# Patient Record
Sex: Male | Born: 1950 | Race: White | Hispanic: No | Marital: Single | Smoking: Never smoker
Health system: Southern US, Community
[De-identification: ages and names within clinical notes are randomized; demographics above are authoritative.]

## PROBLEM LIST (undated history)

## (undated) DIAGNOSIS — I482 Chronic atrial fibrillation, unspecified: Secondary | ICD-10-CM

## (undated) DIAGNOSIS — I639 Cerebral infarction, unspecified: Secondary | ICD-10-CM

## (undated) DIAGNOSIS — I10 Essential (primary) hypertension: Secondary | ICD-10-CM

## (undated) DIAGNOSIS — I5022 Chronic systolic (congestive) heart failure: Secondary | ICD-10-CM

## (undated) DIAGNOSIS — L0390 Cellulitis, unspecified: Secondary | ICD-10-CM

## (undated) DIAGNOSIS — J189 Pneumonia, unspecified organism: Secondary | ICD-10-CM

## (undated) DIAGNOSIS — I499 Cardiac arrhythmia, unspecified: Secondary | ICD-10-CM

## (undated) HISTORY — DX: Cerebral infarction, unspecified: I63.9

## (undated) HISTORY — DX: Cellulitis, unspecified: L03.90

## (undated) HISTORY — DX: Cardiac arrhythmia, unspecified: I49.9

## (undated) HISTORY — DX: Essential (primary) hypertension: I10

---

## 2009-10-27 DIAGNOSIS — J189 Pneumonia, unspecified organism: Secondary | ICD-10-CM

## 2009-10-27 HISTORY — DX: Pneumonia, unspecified organism: J18.9

## 2009-12-06 ENCOUNTER — Inpatient Hospital Stay: Payer: Self-pay | Admitting: Internal Medicine

## 2011-04-29 ENCOUNTER — Inpatient Hospital Stay: Payer: Self-pay | Admitting: Internal Medicine

## 2013-07-16 ENCOUNTER — Emergency Department: Payer: Self-pay | Admitting: Emergency Medicine

## 2013-07-16 LAB — COMPREHENSIVE METABOLIC PANEL
Alkaline Phosphatase: 91 U/L (ref 50–136)
Anion Gap: 9 (ref 7–16)
Bilirubin,Total: 1.8 mg/dL — ABNORMAL HIGH (ref 0.2–1.0)
Calcium, Total: 8.8 mg/dL (ref 8.5–10.1)
Chloride: 103 mmol/L (ref 98–107)
Co2: 25 mmol/L (ref 21–32)
EGFR (African American): 56 — ABNORMAL LOW
Potassium: 3 mmol/L — ABNORMAL LOW (ref 3.5–5.1)
SGPT (ALT): 23 U/L (ref 12–78)
Sodium: 137 mmol/L (ref 136–145)

## 2013-07-16 LAB — CBC
HCT: 43.8 % (ref 40.0–52.0)
HGB: 15.2 g/dL (ref 13.0–18.0)
MCHC: 34.7 g/dL (ref 32.0–36.0)
MCV: 90 fL (ref 80–100)
Platelet: 179 10*3/uL (ref 150–440)
RDW: 13.6 % (ref 11.5–14.5)
WBC: 19.5 10*3/uL — ABNORMAL HIGH (ref 3.8–10.6)

## 2015-04-07 ENCOUNTER — Encounter: Payer: Self-pay | Admitting: Emergency Medicine

## 2015-04-07 ENCOUNTER — Emergency Department: Payer: Medicaid Other

## 2015-04-07 ENCOUNTER — Inpatient Hospital Stay
Admission: EM | Admit: 2015-04-07 | Discharge: 2015-04-11 | DRG: 292 | Disposition: A | Discharge status: Home Health | Payer: Medicaid Other | Attending: Internal Medicine | Admitting: Internal Medicine

## 2015-04-07 DIAGNOSIS — Z8249 Family history of ischemic heart disease and other diseases of the circulatory system: Secondary | ICD-10-CM

## 2015-04-07 DIAGNOSIS — I5023 Acute on chronic systolic (congestive) heart failure: Principal | ICD-10-CM | POA: Diagnosis present

## 2015-04-07 DIAGNOSIS — Z880 Allergy status to penicillin: Secondary | ICD-10-CM

## 2015-04-07 DIAGNOSIS — I482 Chronic atrial fibrillation, unspecified: Secondary | ICD-10-CM

## 2015-04-07 DIAGNOSIS — R600 Localized edema: Secondary | ICD-10-CM | POA: Diagnosis not present

## 2015-04-07 DIAGNOSIS — I429 Cardiomyopathy, unspecified: Secondary | ICD-10-CM | POA: Diagnosis present

## 2015-04-07 DIAGNOSIS — D72819 Decreased white blood cell count, unspecified: Secondary | ICD-10-CM | POA: Diagnosis present

## 2015-04-07 DIAGNOSIS — N182 Chronic kidney disease, stage 2 (mild): Secondary | ICD-10-CM | POA: Diagnosis present

## 2015-04-07 DIAGNOSIS — I4891 Unspecified atrial fibrillation: Secondary | ICD-10-CM | POA: Diagnosis not present

## 2015-04-07 DIAGNOSIS — I480 Paroxysmal atrial fibrillation: Secondary | ICD-10-CM | POA: Diagnosis present

## 2015-04-07 DIAGNOSIS — Z6836 Body mass index (BMI) 36.0-36.9, adult: Secondary | ICD-10-CM

## 2015-04-07 DIAGNOSIS — E669 Obesity, unspecified: Secondary | ICD-10-CM | POA: Diagnosis present

## 2015-04-07 DIAGNOSIS — Z9119 Patient's noncompliance with other medical treatment and regimen: Secondary | ICD-10-CM | POA: Diagnosis present

## 2015-04-07 DIAGNOSIS — I83009 Varicose veins of unspecified lower extremity with ulcer of unspecified site: Secondary | ICD-10-CM | POA: Diagnosis present

## 2015-04-07 DIAGNOSIS — L97909 Non-pressure chronic ulcer of unspecified part of unspecified lower leg with unspecified severity: Secondary | ICD-10-CM

## 2015-04-07 DIAGNOSIS — I129 Hypertensive chronic kidney disease with stage 1 through stage 4 chronic kidney disease, or unspecified chronic kidney disease: Secondary | ICD-10-CM | POA: Diagnosis present

## 2015-04-07 DIAGNOSIS — I509 Heart failure, unspecified: Secondary | ICD-10-CM

## 2015-04-07 DIAGNOSIS — N179 Acute kidney failure, unspecified: Secondary | ICD-10-CM | POA: Diagnosis present

## 2015-04-07 DIAGNOSIS — L03116 Cellulitis of left lower limb: Secondary | ICD-10-CM | POA: Diagnosis present

## 2015-04-07 DIAGNOSIS — L03115 Cellulitis of right lower limb: Secondary | ICD-10-CM | POA: Diagnosis present

## 2015-04-07 DIAGNOSIS — L03119 Cellulitis of unspecified part of limb: Secondary | ICD-10-CM

## 2015-04-07 HISTORY — DX: Pneumonia, unspecified organism: J18.9

## 2015-04-07 LAB — COMPREHENSIVE METABOLIC PANEL
ALT: 13 U/L — AB (ref 17–63)
AST: 18 U/L (ref 15–41)
Albumin: 3.7 g/dL (ref 3.5–5.0)
Alkaline Phosphatase: 94 U/L (ref 38–126)
Anion gap: 11 (ref 5–15)
BUN: 22 mg/dL — ABNORMAL HIGH (ref 6–20)
CALCIUM: 8.8 mg/dL — AB (ref 8.9–10.3)
CO2: 22 mmol/L (ref 22–32)
CREATININE: 1.37 mg/dL — AB (ref 0.61–1.24)
Chloride: 102 mmol/L (ref 101–111)
GFR calc Af Amer: >60 mL/min (ref >60)
GFR, EST NON AFRICAN AMERICAN: 54 mL/min — AB (ref >60)
Glucose, Bld: 136 mg/dL — ABNORMAL HIGH (ref 65–99)
Potassium: 4.3 mmol/L (ref 3.5–5.1)
Sodium: 135 mmol/L (ref 135–145)
Total Bilirubin: 3.3 mg/dL — ABNORMAL HIGH (ref 0.3–1.2)
Total Protein: 7.4 g/dL (ref 6.5–8.1)

## 2015-04-07 LAB — CBC WITH DIFFERENTIAL/PLATELET
BASOS ABS: 0 10*3/uL (ref 0–0.1)
BASOS PCT: 1 %
EOS ABS: 0.1 10*3/uL (ref 0–0.7)
Eosinophils Relative: 2 %
HCT: 47.2 % (ref 40.0–52.0)
HEMOGLOBIN: 15.3 g/dL (ref 13.0–18.0)
LYMPHS ABS: 0.7 10*3/uL — AB (ref 1.0–3.6)
Lymphocytes Relative: 16 %
MCH: 32 pg (ref 26.0–34.0)
MCHC: 32.4 g/dL (ref 32.0–36.0)
MCV: 98.7 fL (ref 80.0–100.0)
Monocytes Absolute: 0.2 10*3/uL (ref 0.2–1.0)
Monocytes Relative: 5 %
Neutro Abs: 3.3 10*3/uL (ref 1.4–6.5)
Neutrophils Relative %: 76 %
PLATELETS: 102 10*3/uL — AB (ref 150–440)
RBC: 4.78 MIL/uL (ref 4.40–5.90)
RDW: 20.7 % — ABNORMAL HIGH (ref 11.5–14.5)
WBC: 4.3 10*3/uL (ref 3.8–10.6)

## 2015-04-07 LAB — GLUCOSE, CAPILLARY: Glucose-Capillary: 119 mg/dL — ABNORMAL HIGH (ref 65–99)

## 2015-04-07 LAB — TROPONIN I

## 2015-04-07 LAB — BRAIN NATRIURETIC PEPTIDE: B Natriuretic Peptide: 1589 pg/mL — ABNORMAL HIGH (ref 0.0–100.0)

## 2015-04-07 MED ORDER — FAMOTIDINE IN NACL 20-0.9 MG/50ML-% IV SOLN
20.0000 mg | Freq: Two times a day (BID) | INTRAVENOUS | Status: DC
  Administered 2015-04-07 – 2015-04-08 (×3): 20 mg via INTRAVENOUS
  Filled 2015-04-07 (×6): qty 50

## 2015-04-07 MED ORDER — FUROSEMIDE 10 MG/ML IJ SOLN
INTRAMUSCULAR | Status: AC
  Administered 2015-04-07: 20 mg via INTRAVENOUS
  Filled 2015-04-07: qty 4

## 2015-04-07 MED ORDER — SILVER SULFADIAZINE 1 % EX CREA
TOPICAL_CREAM | Freq: Once | CUTANEOUS | Status: AC
  Administered 2015-04-07: 1 via TOPICAL

## 2015-04-07 MED ORDER — DILTIAZEM HCL 30 MG PO TABS
ORAL_TABLET | ORAL | Status: AC
  Administered 2015-04-07: 30 mg via ORAL
  Filled 2015-04-07: qty 1

## 2015-04-07 MED ORDER — ONDANSETRON HCL 4 MG/2ML IJ SOLN: 4.0000 mg | Freq: Four times a day (QID) | INTRAMUSCULAR | Status: DC | PRN

## 2015-04-07 MED ORDER — VANCOMYCIN HCL 10 G IV SOLR
1500.0000 mg | INTRAVENOUS | Status: DC
  Administered 2015-04-08 – 2015-04-09 (×3): 1500 mg via INTRAVENOUS
  Filled 2015-04-07 (×5): qty 1500

## 2015-04-07 MED ORDER — FUROSEMIDE 10 MG/ML IJ SOLN: 40.0000 mg | Freq: Two times a day (BID) | INTRAMUSCULAR | Status: DC

## 2015-04-07 MED ORDER — SODIUM CHLORIDE 0.9 % IJ SOLN
3.0000 mL | Freq: Two times a day (BID) | INTRAMUSCULAR | Status: DC
  Administered 2015-04-07 – 2015-04-09 (×4): 3 mL via INTRAVENOUS

## 2015-04-07 MED ORDER — SILVER SULFADIAZINE 1 % EX CREA
TOPICAL_CREAM | CUTANEOUS | Status: AC
  Filled 2015-04-07: qty 85

## 2015-04-07 MED ORDER — ACETAMINOPHEN 650 MG RE SUPP: 650.0000 mg | Freq: Four times a day (QID) | RECTAL | Status: DC | PRN

## 2015-04-07 MED ORDER — HYDROCODONE-ACETAMINOPHEN 5-325 MG PO TABS
1.0000 | ORAL_TABLET | ORAL | Status: DC | PRN
  Filled 2015-04-07: qty 1

## 2015-04-07 MED ORDER — DILTIAZEM HCL 25 MG/5ML IV SOLN
10.0000 mg | Freq: Once | INTRAVENOUS | Status: AC
  Administered 2015-04-07: 10 mg via INTRAVENOUS

## 2015-04-07 MED ORDER — DOCUSATE SODIUM 100 MG PO CAPS
100.0000 mg | ORAL_CAPSULE | Freq: Two times a day (BID) | ORAL | Status: DC
  Administered 2015-04-08 – 2015-04-10 (×3): 100 mg via ORAL
  Filled 2015-04-07 (×6): qty 1

## 2015-04-07 MED ORDER — DILTIAZEM HCL 30 MG PO TABS
60.0000 mg | ORAL_TABLET | ORAL | Status: AC
  Administered 2015-04-07: 60 mg via ORAL

## 2015-04-07 MED ORDER — VANCOMYCIN HCL IN DEXTROSE 1-5 GM/200ML-% IV SOLN
INTRAVENOUS | Status: AC
  Filled 2015-04-07: qty 200

## 2015-04-07 MED ORDER — APIXABAN 5 MG PO TABS
5.0000 mg | ORAL_TABLET | Freq: Two times a day (BID) | ORAL | Status: DC
  Administered 2015-04-07 – 2015-04-11 (×8): 5 mg via ORAL
  Filled 2015-04-07 (×8): qty 1

## 2015-04-07 MED ORDER — POLYETHYLENE GLYCOL 3350 17 G PO PACK: 17.0000 g | PACK | Freq: Every day | ORAL | Status: DC | PRN

## 2015-04-07 MED ORDER — METOPROLOL TARTRATE 25 MG PO TABS
25.0000 mg | ORAL_TABLET | Freq: Two times a day (BID) | ORAL | Status: DC
  Administered 2015-04-07 – 2015-04-11 (×8): 25 mg via ORAL
  Filled 2015-04-07 (×8): qty 1

## 2015-04-07 MED ORDER — ACETAMINOPHEN 325 MG PO TABS
650.0000 mg | ORAL_TABLET | Freq: Four times a day (QID) | ORAL | Status: DC | PRN
Start: 2015-04-07 — End: 2015-04-11

## 2015-04-07 MED ORDER — DILTIAZEM HCL 30 MG PO TABS
30.0000 mg | ORAL_TABLET | Freq: Four times a day (QID) | ORAL | Status: DC
  Administered 2015-04-07 – 2015-04-09 (×8): 30 mg via ORAL
  Filled 2015-04-07 (×7): qty 1

## 2015-04-07 MED ORDER — FUROSEMIDE 10 MG/ML IJ SOLN
20.0000 mg | INTRAMUSCULAR | Status: AC
  Administered 2015-04-07: 20 mg via INTRAVENOUS

## 2015-04-07 MED ORDER — DILTIAZEM HCL 30 MG PO TABS
ORAL_TABLET | ORAL | Status: AC
  Filled 2015-04-07: qty 2

## 2015-04-07 MED ORDER — ONDANSETRON HCL 4 MG PO TABS: 4.0000 mg | ORAL_TABLET | Freq: Four times a day (QID) | ORAL | Status: DC | PRN

## 2015-04-07 MED ORDER — ASPIRIN EC 81 MG PO TBEC
81.0000 mg | DELAYED_RELEASE_TABLET | Freq: Every day | ORAL | Status: DC
  Administered 2015-04-08 – 2015-04-11 (×4): 81 mg via ORAL
  Filled 2015-04-07 (×4): qty 1

## 2015-04-07 MED ORDER — VANCOMYCIN HCL IN DEXTROSE 1-5 GM/200ML-% IV SOLN
1000.0000 mg | Freq: Once | INTRAVENOUS | Status: AC
  Administered 2015-04-07: 1000 mg via INTRAVENOUS

## 2015-04-07 MED ORDER — DILTIAZEM HCL 25 MG/5ML IV SOLN
INTRAVENOUS | Status: AC
  Administered 2015-04-07: 10 mg via INTRAVENOUS
  Filled 2015-04-07: qty 5

## 2015-04-07 NOTE — ED Notes (Signed)
Patient noticed large bilateral draining wounds from his anterior lower legs. Patient said they resolved and returned worse than before. Patient arrives to triage with tachycardia. Denies n/v, fevers. Also denies having a PCP or any medical hx

## 2015-04-07 NOTE — ED Provider Notes (Signed)
[facility] Emergency Department Provider Note  ____________________________________________  Time seen: Approximately 5:29 PM  I have reviewed the triage vital signs and the nursing notes.   HISTORY  Chief Complaint Wound Check    HPI Aaron Harrison is a 64 y.o. male comes for evaluation of his lower legs. For about a month he's been experiencing redness, pain, and drainage from his legs. He stated this Better and now has come back and is worse in both legs. He says his wounds are weeping, and draining, and red. Not painful.  He does state that his father had a history of congestive heart failure, and with the swelling is concerned he may have it. He denies any chest pain, difficulty breathing, though he does get short of breath with walking long distances. History of diabetes or poor circulation which she is aware of. There is no numbness or pain in the legs. He does feel his legs are heavy and feel like "sandpaper" while walking.  No fever.  not under the care of a physician and has not been seen by primary care sometime.   Patient does state he has a history of atrial fibrillation which she was told he may have had when he was diagnosed with pneumonia several years ago but does not have treatment.   There are no active problems to display for this patient.   History reviewed. No pertinent past surgical history.  No current outpatient prescriptions on file.  Allergies Penicillins  History reviewed. No pertinent family history.  Social History History  Substance Use Topics  . Smoking status: Never Smoker   . Smokeless tobacco: Never Used  . Alcohol Use: No    Review of Systems Constitutional: No fever/chills Eyes: No visual changes. ENT: No sore throat. Cardiovascular: Denies chest pain. Respiratory: Denies shortness of breath. Gastrointestinal: No abdominal pain.  No nausea, no vomiting.  No diarrhea.  No constipation. Genitourinary:  Negative for dysuria. Musculoskeletal: See history of present illness Skin: Negative for rash. Neurological: Negative for headaches, focal weakness or numbness.  10-point ROS otherwise negative.  ____________________________________________   PHYSICAL EXAM:  VITAL SIGNS: ED Triage Vitals  Enc Vitals Group     BP 04/07/15 1631 147/91 mmHg     Pulse --      Resp --      Temp 04/07/15 1631 98.1 F (36.7 C)     Temp Source 04/07/15 1631 Oral     SpO2 04/07/15 1631 95 %     Weight 04/07/15 1631 190 lb (86.183 kg)     Height 04/07/15 1631 5\' 9"  (1.753 m)     Head Cir --      Peak Flow --      Pain Score 04/07/15 1633 0     Pain Loc --      Pain Edu? --      Excl. in [place]? --     Constitutional: Alert and oriented. Well appearing and in no acute distress. Eyes: Conjunctivae are normal. PERRL. EOMI. Head: Atraumatic. Nose: No congestion/rhinnorhea. Mouth/Throat: Mucous membranes are moist.  Oropharynx non-erythematous. Neck: No stridor.   Cardiovascular: irRegular, slightly tachycardic Grossly normal heart sounds.  Good peripheral circulation. Respiratory: Normal respiratory effort.  No retractions. Lungs CTAB. Gastrointestinal: Soft and nontender. No distention. No abdominal bruits. No CVA tenderness. Musculoskeletal: Patient has 3+ lower extremity edema with severe venous stasis changes, there is erythema to approximately the knees bilaterally. The patient has several ulcers which are draining both clear fluid  as well as some purulence at the edges. Ulcers range in size from about 4-7 cm are ovoid in appearance. Does have normal capillary refill lower extremities, but we do to use Doppler obtained pulses. His sensation is normal in the lower extremities. Neurologic:  Normal speech and language. No gross focal neurologic deficits are appreciated. Speech is normal. No gait instability. Skin:  Skin is warm, dry and intact. No rash noted. Psychiatric: Mood and affect are normal.  Speech and behavior are normal.  ____________________________________________   LABS (all labs ordered are listed, but only abnormal results are displayed)  Labs Reviewed  COMPREHENSIVE METABOLIC PANEL - Abnormal; Notable for the following:    Glucose, Bld 136 (*)    BUN 22 (*)    Creatinine, Ser 1.37 (*)    Calcium 8.8 (*)    ALT 13 (*)    Total Bilirubin 3.3 (*)    GFR calc non Af Amer 54 (*)    All other components within normal limits  CBC WITH DIFFERENTIAL/PLATELET - Abnormal; Notable for the following:    RDW 20.7 (*)    Platelets 102 (*)    Lymphs Abs 0.7 (*)    All other components within normal limits  CULTURE, BLOOD (ROUTINE X 2)  CULTURE, BLOOD (ROUTINE X 2)  TROPONIN I  BRAIN NATRIURETIC PEPTIDE   ____________________________________________  EKG  Reviewed interpreted by me Ventricular rate of 125 Atrial fibrillation with rapid ventricular response There is T-wave inversion in lateral leads QRS 80 QTc 389 This is an abnormal EKG indication of atrial fibrillation, RVR, and T-wave abnormality suggesting possible lateral ischemia  In the setting of the patient's chief complaint of leg wounds and no cardiopulmonary complaint, I find acute coronary syndrome to be very very unlikely though his EKG does indicate he may have some mild ischemia associated with rate changes. We will send a troponin. ____________________________________________  RADIOLOGY  IMPRESSION: Mild congestive heart failure with mild interstitial edema and small bilateral pleural effusions. ____________________________________________   PROCEDURES  Procedure(s) performed: None  Critical Care performed: No  ____________________________________________   INITIAL IMPRESSION / ASSESSMENT AND PLAN / ED COURSE  Pertinent labs & imaging results that were available during my care of the patient were reviewed by me and considered in my medical decision making (see chart for details).  I  lateral lower extremity ulcers, likely poor circulation, and overlying concomitant cellulitis. Patient has bilateral lower extremity cellulitis and will require IV antibiotic, wound care consultation, admission to hospital.  In addition he is found to be in A. fib RVR with his peripheral edema this may be indicative of CHF. We will evaluate with chest x-ray, BNP, obtain troponins. We will give him diltiazem by mouth here for his tachycardia and monitor for spots. ____________________________________________  ----------------------------------------- 7:21 PM on 04/07/2015 -----------------------------------------  Heart rate is improved to approximately 100-110. I will give him some additional diltiazem for rate control. He has a blood pressure that is quite adequate at 140s.  We will admit the patient at this time.  FINAL CLINICAL IMPRESSION(S) / ED DIAGNOSES  Final diagnoses:  Bilateral cellulitis of lower leg  Bilateral edema of lower extremity  Atrial fibrillation with rapid ventricular response   congestive heart failure, acute, initial, new diagnosis    Delman Kitten, MD 04/07/15 [place]

## 2015-04-07 NOTE — H&P (Signed)
History and Physical    Aaron Harrison WEX:[id number] DOB: 1950/11/02 DOA: 04/07/2015  Referring physician: Dr. Jacqualine Code PCP: No primary care provider on file.  Specialists: none  Chief Complaint: LE edema/redness with SOB  HPI: Aaron Harrison is a 64 y.o. male has a past medical history significant for atrial fibrillation who presents to ER with complaints of "infe ction" in lower extremities. Also c/o SOB. Denies Cp. In ER, pt found to have LE cellulitis with venous stasis and stasis ulcerations. Also noted to be in A-fib with CHF on CXR. He is now admitted for further evaluation and treatment.  Review of Systems: The patient denies anorexia, fever, weight loss,, vision loss, decreased hearing, hoarseness, chest pain, syncope, balance deficits, hemoptysis, abdominal pain, melena, hematochezia, severe indigestion/heartburn, hematuria, incontinence, genital sores, muscle weakness, suspicious skin lesions, transient blindness, difficulty walking, depression, unusual weight change, abnormal bleeding, enlarged lymph nodes, angioedema, and breast masses.   Past Medical History  Diagnosis Date  . Dysrhythmia    History reviewed. No pertinent past surgical history. Social History:  reports that he has never smoked. He has never used smokeless tobacco. He reports that he does not drink alcohol or use illicit drugs.  Allergies  Allergen Reactions  . Penicillins Nausea And Vomiting    History reviewed. No pertinent family history.  Prior to Admission medications   Not on File   Physical Exam: Filed Vitals:   04/07/15 1631 04/07/15 1920  BP: 147/91 142/103  Pulse:  84  Temp: 98.1 F (36.7 C)   TempSrc: Oral   Resp:  20  Height: 5\' 9"  (1.753 m)   Weight: 86.183 kg (190 lb)   SpO2: 95% 93%     General:  No apparent distress  Eyes: PERRL, EOMI, no scleral icterus  ENT: moist oropharynx  Neck: supple, no lymphadenopathy  Cardiovascular: irregularly irregular without MRG; 2+  peripheral pulses, no JVD, 3+ peripheral edema  Respiratory: bilateral rales noted without wheezes or rhonchi  Abdomen: soft, non tender to palpation, positive bowel sounds, no guarding, no rebound  Skin: diffuse cellulitis to LE with erythema, warmth, and purulence. Ulcerations noted.  Musculoskeletal: normal bulk and tone, no joint swelling  Psychiatric: normal mood and affect  Neurologic: CN 2-12 grossly intact, MS 5/5 in all 4  Labs on Admission:  Basic Metabolic Panel:  Recent Labs Lab 04/07/15 1641  NA 135  K 4.3  CL 102  CO2 22  GLUCOSE 136*  BUN 22*  CREATININE 1.37*  CALCIUM 8.8*   Liver Function Tests:  Recent Labs Lab 04/07/15 1641  AST 18  ALT 13*  ALKPHOS 94  BILITOT 3.3*  PROT 7.4  ALBUMIN 3.7   No results for input(s): LIPASE, AMYLASE in the last 168 hours. No results for input(s): AMMONIA in the last 168 hours. CBC:  Recent Labs Lab 04/07/15 1641  WBC 4.3  NEUTROABS 3.3  HGB 15.3  HCT 47.2  MCV 98.7  PLT 102*   Cardiac Enzymes:  Recent Labs Lab 04/07/15 1641  TROPONINI <0.03    BNP (last 3 results)  Recent Labs  04/07/15 1641  BNP 1589.0*    ProBNP (last 3 results) No results for input(s): PROBNP in the last 8760 hours.  CBG: No results for input(s): GLUCAP in the last 168 hours.  Radiological Exams on Admission: Dg Chest 2 View  04/07/2015   CLINICAL DATA:  Bilateral leg edema. History of congestive heart failure. Initial encounter.  EXAM: CHEST  2 VIEW  COMPARISON:  Radiographs 07/16/2013 and 12/09/2009.  FINDINGS: There is stable cardiomegaly with increased vascular congestion and mild interstitial edema. There are small bilateral pleural effusions. No confluent airspace opacity or pneumothorax. The bones appear unchanged.  IMPRESSION: Mild congestive heart failure with mild interstitial edema and small bilateral pleural effusions.   Electronically Signed   By: Richardean Sale M.D.   On: 04/07/2015 18:07    EKG:  Independently reviewed.  Assessment/Plan Principal Problem:   CHF (congestive heart failure) Active Problems:   Cellulitis of lower extremity   Atrial fibrillation, chronic   Stasis ulcer of lower extremity   Bilateral lower extremity edema   Will admit to telemetry with IV Lasix and IV ABX. Cultures sent. Check echo. Consult Vascular and Cardiology. F/u labs and CXR in AM.  Diet: 2g Na+ Fluids: none DVT Prophylaxis: Eliquis  Code Status: FULL  Family Communication: none  Disposition Plan: SNF  Time spent: 50 min

## 2015-04-07 NOTE — Progress Notes (Signed)
ANTIBIOTIC CONSULT NOTE - INITIAL  Pharmacy Consult for Vancomycin Indication: Cellulitis  Allergies  Allergen Reactions  . Penicillins Nausea And Vomiting    Patient Measurements: Height: 5\' 9"  (175.3 cm) Weight: 190 lb (86.183 kg) IBW/kg (Calculated) : 70.7 Adjusted Body Weight: 76.9 kg  Vital Signs: Temp: 98.1 F (36.7 C) (06/11 1631) Temp Source: Oral (06/11 1631) BP: 142/103 mmHg (06/11 1920) Pulse Rate: 84 (06/11 1920) Intake/Output from previous day:   Intake/Output from this shift:    Labs:  Recent Labs  04/07/15 1641  WBC 4.3  HGB 15.3  PLT 102*  CREATININE 1.37*   Estimated Creatinine Clearance: 61.6 mL/min (by C-G formula based on Cr of 1.37). No results for input(s): VANCOTROUGH, VANCOPEAK, VANCORANDOM, GENTTROUGH, GENTPEAK, GENTRANDOM, TOBRATROUGH, TOBRAPEAK, TOBRARND, AMIKACINPEAK, AMIKACINTROU, AMIKACIN in the last 72 hours.   Microbiology: No results found for this or any previous visit (from the past 720 hour(s)).  Medical History: Past Medical History  Diagnosis Date  . Dysrhythmia     Medications:  Anti-infectives    Start     Dose/Rate Route Frequency Ordered Stop   04/07/15 1746  vancomycin (VANCOCIN) 1 GM/200ML IVPB    Comments:  DUGGINS, DANA: cabinet override      04/07/15 1746 04/07/15 1754   04/07/15 1730  vancomycin (VANCOCIN) IVPB 1000 mg/200 mL premix     1,000 mg 200 mL/hr over 60 Minutes Intravenous  Once 04/07/15 1715 04/07/15 1925     Assessment: Patient admitted for cellulitis of lower extremity. Empirically started on Vancomycin.   Goal of Therapy:  Vancomycin trough level 10-15 mcg/ml  Plan:  Measure antibiotic drug levels at steady state Follow up culture results Ke=0.056, t1/2=12.4hr, Vd=60.3L  Vancomycin 1500mg  IV q18h. Trough level prior to 6/14 12noon dose.  Paulina Fusi, PharmD, BCPS 04/07/2015 9:25 PM

## 2015-04-08 ENCOUNTER — Inpatient Hospital Stay: Payer: Medicaid Other

## 2015-04-08 ENCOUNTER — Inpatient Hospital Stay
Admit: 2015-04-08 | Discharge: 2015-04-08 | Disposition: A | Discharge status: Home / Self Care | Payer: Medicaid Other | Attending: Internal Medicine | Admitting: Internal Medicine

## 2015-04-08 DIAGNOSIS — R600 Localized edema: Secondary | ICD-10-CM

## 2015-04-08 LAB — CBC
HEMATOCRIT: 44.5 % (ref 40.0–52.0)
Hemoglobin: 14.7 g/dL (ref 13.0–18.0)
MCH: 32.5 pg (ref 26.0–34.0)
MCHC: 33.1 g/dL (ref 32.0–36.0)
MCV: 98.2 fL (ref 80.0–100.0)
Platelets: 100 10*3/uL — ABNORMAL LOW (ref 150–440)
RBC: 4.53 MIL/uL (ref 4.40–5.90)
RDW: 20.9 % — AB (ref 11.5–14.5)
WBC: 3.8 10*3/uL (ref 3.8–10.6)

## 2015-04-08 LAB — BASIC METABOLIC PANEL
ANION GAP: 7 (ref 5–15)
BUN: 23 mg/dL — ABNORMAL HIGH (ref 6–20)
CO2: 25 mmol/L (ref 22–32)
Calcium: 8.1 mg/dL — ABNORMAL LOW (ref 8.9–10.3)
Chloride: 105 mmol/L (ref 101–111)
Creatinine, Ser: 1.35 mg/dL — ABNORMAL HIGH (ref 0.61–1.24)
GFR calc Af Amer: >60 mL/min (ref >60)
GFR, EST NON AFRICAN AMERICAN: 55 mL/min — AB (ref >60)
GLUCOSE: 117 mg/dL — AB (ref 65–99)
Potassium: 4 mmol/L (ref 3.5–5.1)
SODIUM: 137 mmol/L (ref 135–145)

## 2015-04-08 LAB — GLUCOSE, CAPILLARY: GLUCOSE-CAPILLARY: 125 mg/dL — AB (ref 65–99)

## 2015-04-08 MED ORDER — FUROSEMIDE 10 MG/ML IJ SOLN
40.0000 mg | Freq: Two times a day (BID) | INTRAMUSCULAR | Status: DC
  Administered 2015-04-08 – 2015-04-11 (×6): 40 mg via INTRAVENOUS
  Filled 2015-04-08 (×6): qty 4

## 2015-04-08 MED ORDER — PIPERACILLIN-TAZOBACTAM 3.375 G IVPB
3.3750 g | Freq: Three times a day (TID) | INTRAVENOUS | Status: DC
  Administered 2015-04-08 – 2015-04-11 (×9): 3.375 g via INTRAVENOUS
  Filled 2015-04-08 (×13): qty 50

## 2015-04-08 MED ORDER — FUROSEMIDE 10 MG/ML IJ SOLN
INTRAMUSCULAR | Status: AC
  Administered 2015-04-08: 40 mg
  Filled 2015-04-08: qty 4

## 2015-04-08 NOTE — Progress Notes (Signed)
Old dressings on BLE  Removed, legs cleaned with wound care spray,  New dressing applied.  Vascular MD in to  Assess legs / wounds during bandage change.  Pt now resting quietly with legs elevated, in NAD, skin warm and dry.  Denies any discomfort at this time.  VSS, remains FIB per monitor.

## 2015-04-08 NOTE — Evaluation (Signed)
Physical Therapy Evaluation Patient Details Name: Aaron Harrison MRN: [id number] DOB: 12-20-1950 Today's Date: 04/08/2015   History of Present Illness  Pt here with CHF  Clinical Impression  Pt does well with ambulation, stairs, and generally shows good confidence and balance reporting that he is at his baseline.  Pt does not require further PT and has no interest in further PT.  He does have some chronic R knee weakness, buckling but is fully functional.     Follow Up Recommendations No PT follow up    Equipment Recommendations       Recommendations for Other Services       Precautions / Restrictions Precautions Precautions: Fall Restrictions Weight Bearing Restrictions: No      Mobility  Bed Mobility Overal bed mobility: Independent                Transfers Overall transfer level: Independent Equipment used: None                Ambulation/Gait Ambulation/Gait assistance: Independent Ambulation Distance (Feet): 200 Feet Assistive device: None       General Gait Details: Pt reports he is walking at his baseline, he shows no safety concerns  Stairs Stairs: Yes Stairs assistance: Independent Stair Management: One rail Right Number of Stairs: 6 General stair comments: Pt with some R knee buckling when leading with R, reports this has been baseline since injury when he was 12  Wheelchair Mobility    Modified Rankin (Stroke Patients Only)       Balance                                             Pertinent Vitals/Pain      Home Living Family/patient expects to be discharged to:: Private residence Living Arrangements: Alone   Type of Home: House Home Access: Stairs to enter   [employer] of Steps: 4          Prior Function Level of Independence: Independent (working)               Journalist, newspaper        Extremity/Trunk Assessment   Upper Extremity Assessment: Overall WFL for tasks assessed            Lower Extremity Assessment: Overall WFL for tasks assessed         Communication   Communication: No difficulties  Cognition Arousal/Alertness: Awake/alert Behavior During Therapy: WFL for tasks assessed/performed Overall Cognitive Status: Within Functional Limits for tasks assessed                      General Comments      Exercises        Assessment/Plan    PT Assessment Patent does not need any further PT services  PT Diagnosis     PT Problem List    PT Treatment Interventions     PT Goals (Current goals can be found in the Care Plan section)      Frequency     Barriers to discharge        Co-evaluation               End of Session Equipment Utilized During Treatment: Gait belt Activity Tolerance: Patient tolerated treatment well Patient left: with bed alarm set           Time:  2355-7322 PT Time Calculation (min) (ACUTE ONLY): 21 min   Charges:   PT Evaluation $Initial PT Evaluation Tier I: 1 Procedure     PT G Codes:       Wayne Both, PT, DPT [phone]  Kreg Shropshire 04/08/2015, 4:49 PM

## 2015-04-08 NOTE — Consult Note (Signed)
         Consult Note  Patient name: Aaron Harrison MRN: [id number] DOB: Feb 17, 1951 Sex: male  Consulting Physician:  Hospitalists  Reason for Consult:  Chief Complaint  Patient presents with  . Wound Check    HISTORY OF PRESENT ILLNESS: This is a 64 year old male who presented to the ER with bilateral lower extremity wounds.  He was admitted for cellulitis and placed on IV Abx as well as wound care.  Patient denies any history of DVT or PE.   He is being diuresed for possible CHF.  He does not have a history of CHF, but his mother died of CHF.  He was found to be in AFIB on arrival and is being evaluated by cardiology.  His creatinine was elevated on admission  Past Medical History  Diagnosis Date  . Dysrhythmia   . Pneumonia 2011    History reviewed. No pertinent past surgical history.  History   Social History  . Marital Status: Single    Spouse Name: N/A  . Number of Children: N/A  . Years of Education: N/A   Occupational History  . Not on file.   Social History Main Topics  . Smoking status: Never Smoker   . Smokeless tobacco: Never Used  . Alcohol Use: No  . Drug Use: No  . Sexual Activity: Not on file   Other Topics Concern  . Not on file   Social History Narrative  . No narrative on file   Family History: Mother with CHF  Allergies as of 04/07/2015 - Review Complete 04/07/2015  Allergen Reaction Noted  . Penicillins Nausea And Vomiting 04/07/2015    No current facility-administered medications on file prior to encounter.   No current outpatient prescriptions on file prior to encounter.     REVIEW OF SYSTEMS: See HPI, otherwise negative  PHYSICAL EXAMINATION: General: The patient appears their stated age.  Vital signs are BP 107/70 mmHg  Pulse 82  Temp(Src) 97.7 F (36.5 C) (Oral)  Resp 18  Ht 5\' 9"  (1.753 m)  Wt 250 lb 3.2 oz (113.49 kg)  BMI 36.93 kg/m2  SpO2 94% Pulmonary: Respirations are non-labored HEENT:  No gross  abnormalities Abdomen: Soft and non-tender  Musculoskeletal: There are no major deformities.   Neurologic: No focal weakness or paresthesias are detected, Skin: bilateral lower extremity wounds with edema Psychiatric: The patient has normal affect. Cardiovascular: palpable pedal pulses  Diagnostic Studies: none    Assessment:  Bilateral lower extremity wounds Plan: His wounds are likely related to his bilateral lower extremity swelling.  The etiology of the edema is unknown currently.  It is either related to CHF, renal disease, venous insufficiency or lymphadema.  He needs IV Abx to address his cellulitis.  Continue with local wound care.  At discharge, he will need compression wraps, likely UNNA boots on both legs, to be changed 3 times per week.  He can undergo an outpatient venous insufficiency work up.  We will follow him while he is in the hospital     V. Leia Alf, M.D. [facility] Office: [phone] Pager:  [phone]

## 2015-04-08 NOTE — Consult Note (Signed)
Reason for Consult:Edema, congestive heart failure, edema, AFib Referring Physician: Dr Georgie Chard  Aaron Harrison is an 64 y.o. male.  HPI:  64 year old male history of atrial fibrillation paroxysmal lower extremity edema shortness of breath weakness redness at her feet with ulcers and possible cellulitis complains of tachycardia. Patient has had recurrent swelling of his legs with redness no fever no chills sweats she has had occasional shortness of breath and dyspnea on exertion. He has had palpitations and tachycardia for several years does not fall within 1 has not seen cardiologist and does not have a primary physician is not on any medications. Because of the significant swelling of his legs redness and ulcers he presented to the emergency for evaluation was found to be in atrial fibrillation cardiology was recommended for further evaluation and care  Past Medical History  Diagnosis Date  . Dysrhythmia   . Pneumonia 2011    History reviewed. No pertinent past surgical history.  History reviewed. No pertinent family history.  Social History:  reports that he has never smoked. He has never used smokeless tobacco. He reports that he does not drink alcohol or use illicit drugs.  Allergies:  Allergies  Allergen Reactions  . Penicillins Nausea And Vomiting    Medications:  Prior to Admission:  Prescriptions prior to admission  Medication Sig Dispense Refill Last Dose  . aspirin EC 81 MG tablet Take 81 mg by mouth as needed.       Results for orders placed or performed during the hospital encounter of 04/07/15 (from the past 48 hour(s))  Culture, blood (routine x 2)     Status: None (Preliminary result)   Collection Time: 04/07/15  4:35 PM  Result Value Ref Range   Specimen Description BLOOD    Special Requests NONE    Culture NO GROWTH < 24 HOURS    Report Status PENDING   Glucose, capillary     Status: Abnormal   Collection Time: 04/07/15  4:38 PM  Result Value Ref Range     Glucose-Capillary 119 (H) 65 - 99 mg/dL  Comprehensive metabolic panel     Status: Abnormal   Collection Time: 04/07/15  4:41 PM  Result Value Ref Range   Sodium 135 135 - 145 mmol/L   Potassium 4.3 3.5 - 5.1 mmol/L   Chloride 102 101 - 111 mmol/L   CO2 22 22 - 32 mmol/L   Glucose, Bld 136 (H) 65 - 99 mg/dL   BUN 22 (H) 6 - 20 mg/dL   Creatinine, Ser 1.37 (H) 0.61 - 1.24 mg/dL   Calcium 8.8 (L) 8.9 - 10.3 mg/dL   Total Protein 7.4 6.5 - 8.1 g/dL   Albumin 3.7 3.5 - 5.0 g/dL   AST 18 15 - 41 U/L   ALT 13 (L) 17 - 63 U/L   Alkaline Phosphatase 94 38 - 126 U/L   Total Bilirubin 3.3 (H) 0.3 - 1.2 mg/dL   GFR calc non Af Amer 54 (L) >60 mL/min   GFR calc Af Amer >60 >60 mL/min    Comment: (NOTE) The eGFR has been calculated using the CKD EPI equation. This calculation has not been validated in all clinical situations. eGFR's persistently <60 mL/min signify possible Chronic Kidney Disease.    Anion gap 11 5 - 15  CBC with Differential     Status: Abnormal   Collection Time: 04/07/15  4:41 PM  Result Value Ref Range   WBC 4.3 3.8 - 10.6 K/uL   RBC  4.78 4.40 - 5.90 MIL/uL   Hemoglobin 15.3 13.0 - 18.0 g/dL   HCT 47.2 40.0 - 52.0 %   MCV 98.7 80.0 - 100.0 fL   MCH 32.0 26.0 - 34.0 pg   MCHC 32.4 32.0 - 36.0 g/dL   RDW 20.7 (H) 11.5 - 14.5 %   Platelets 102 (L) 150 - 440 K/uL   Neutrophils Relative % 76 %   Neutro Abs 3.3 1.4 - 6.5 K/uL   Lymphocytes Relative 16 %   Lymphs Abs 0.7 (L) 1.0 - 3.6 K/uL   Monocytes Relative 5 %   Monocytes Absolute 0.2 0.2 - 1.0 K/uL   Eosinophils Relative 2 %   Eosinophils Absolute 0.1 0 - 0.7 K/uL   Basophils Relative 1 %   Basophils Absolute 0.0 0 - 0.1 K/uL  Culture, blood (routine x 2)     Status: None (Preliminary result)   Collection Time: 04/07/15  4:41 PM  Result Value Ref Range   Specimen Description BLOOD    Special Requests NONE    Culture NO GROWTH < 24 HOURS    Report Status PENDING   Brain natriuretic peptide     Status:  Abnormal   Collection Time: 04/07/15  4:41 PM  Result Value Ref Range   B Natriuretic Peptide 1589.0 (H) 0.0 - 100.0 pg/mL  Troponin I     Status: None   Collection Time: 04/07/15  4:41 PM  Result Value Ref Range   Troponin I <0.03 <0.031 ng/mL    Comment:        NO INDICATION OF MYOCARDIAL INJURY.   Basic metabolic panel     Status: Abnormal   Collection Time: 04/08/15  4:28 AM  Result Value Ref Range   Sodium 137 135 - 145 mmol/L   Potassium 4.0 3.5 - 5.1 mmol/L   Chloride 105 101 - 111 mmol/L   CO2 25 22 - 32 mmol/L   Glucose, Bld 117 (H) 65 - 99 mg/dL   BUN 23 (H) 6 - 20 mg/dL   Creatinine, Ser 1.35 (H) 0.61 - 1.24 mg/dL   Calcium 8.1 (L) 8.9 - 10.3 mg/dL   GFR calc non Af Amer 55 (L) >60 mL/min   GFR calc Af Amer >60 >60 mL/min    Comment: (NOTE) The eGFR has been calculated using the CKD EPI equation. This calculation has not been validated in all clinical situations. eGFR's persistently <60 mL/min signify possible Chronic Kidney Disease.    Anion gap 7 5 - 15  CBC     Status: Abnormal   Collection Time: 04/08/15  4:28 AM  Result Value Ref Range   WBC 3.8 3.8 - 10.6 K/uL   RBC 4.53 4.40 - 5.90 MIL/uL   Hemoglobin 14.7 13.0 - 18.0 g/dL   HCT 44.5 40.0 - 52.0 %   MCV 98.2 80.0 - 100.0 fL   MCH 32.5 26.0 - 34.0 pg   MCHC 33.1 32.0 - 36.0 g/dL   RDW 20.9 (H) 11.5 - 14.5 %   Platelets 100 (L) 150 - 440 K/uL  Glucose, capillary     Status: Abnormal   Collection Time: 04/08/15  7:13 AM  Result Value Ref Range   Glucose-Capillary 125 (H) 65 - 99 mg/dL   Comment 1 Notify RN     X-ray Chest Pa And Lateral  04/08/2015   CLINICAL DATA:  Congestive heart failure. Pneumonia with lower extremity cellulitis and stasis ulcers.  EXAM: CHEST  2 VIEW  COMPARISON:  04/07/2015  and 07/16/2013.  FINDINGS: There is stable cardiomegaly. Pulmonary edema and small bilateral pleural effusions have slightly worsened. There is no confluent airspace opacity or pneumothorax. The bones appear  unchanged.  IMPRESSION: Slight worsening of congestive heart failure with persistent small bilateral pleural effusions.   Electronically Signed   By: Richardean Sale M.D.   On: 04/08/2015 09:05   Dg Chest 2 View  04/07/2015   CLINICAL DATA:  Bilateral leg edema. History of congestive heart failure. Initial encounter.  EXAM: CHEST  2 VIEW  COMPARISON:  Radiographs 07/16/2013 and 12/09/2009.  FINDINGS: There is stable cardiomegaly with increased vascular congestion and mild interstitial edema. There are small bilateral pleural effusions. No confluent airspace opacity or pneumothorax. The bones appear unchanged.  IMPRESSION: Mild congestive heart failure with mild interstitial edema and small bilateral pleural effusions.   Electronically Signed   By: Richardean Sale M.D.   On: 04/07/2015 18:07    Review of Systems  Constitutional: Positive for malaise/fatigue.  HENT: Positive for congestion.   Eyes: Negative.   Respiratory: Positive for shortness of breath.   Cardiovascular: Positive for palpitations, orthopnea and leg swelling.  Gastrointestinal: Negative.   Genitourinary: Negative.   Musculoskeletal: Negative.   Skin: Negative.   Neurological: Positive for weakness.  Endo/Heme/Allergies: Negative.   Psychiatric/Behavioral: Negative.    Blood pressure 107/70, pulse 82, temperature 97.7 F (36.5 C), temperature source Oral, resp. rate 18, height $RemoveBe'5\' 9"'[id number]$  (1.753 m), weight 113.49 kg (250 lb 3.2 oz), SpO2 94 %. Physical Exam  Constitutional: He is oriented to person, place, and time. He appears well-developed and well-nourished.  HENT:  Head: Normocephalic.  Right Ear: External ear normal.  Eyes: EOM are normal. Pupils are equal, round, and reactive to light.  Neck: Normal range of motion. Neck supple.  Cardiovascular: Normal rate, S1 normal, S2 normal, intact distal pulses and normal pulses.  An irregularly irregular rhythm present.  Murmur heard.  Systolic murmur is present with a grade of  2/6   3+ pitting edema with ulcerations of the lower extremities suggestive of cellulitis with redness and ozing  Musculoskeletal: He exhibits edema and tenderness.  Neurological: He is alert and oriented to person, place, and time. He has normal reflexes.  Skin: Skin is warm and dry. Lesion and petechiae noted. There is erythema.       Assessment/Plan:  cellulitis  edema  atrial fibrillation  obesity  congestive heart failure  tachycardia  shortness of breath  dyspnea on exertion  noncompliance . PLAN  rule out myocardial infarction  continue telemetry for atrial fibrillation  recommend rate control for AFib  congestive heart failure would recommend  ACE-inhibitor beta-blocker diuretics  vascular for venous stasis changes and leg edema  support stockings and wrapped legs were swelling  elevate legs  broad-spectrum antibiotic therapy for leg edema  renal insufficiency recommend Nephrology input  diuretic therapy for leg edema swelling and shortness of breath  agree with echocardiogram for assessments of left ventricular function  anticoagulation for atrial fibrillation  recommend switch from diltiazem to beta-blocker because of potential of leg swelling  do not recommend cardioversion at this point  consider amiodarone for rhythm control  do not recommend cardiac catheterization at this stage  Alanson Hausmann D. 04/08/2015, 3:05 PM

## 2015-04-08 NOTE — Progress Notes (Signed)
ANTIBIOTIC CONSULT NOTE - INITIAL  Pharmacy Consult for Zosyn Indication: wound infection   Allergies  Allergen Reactions  . Penicillins Nausea And Vomiting    Patient Measurements: Height: 5\' 9"  (175.3 cm) Weight: 250 lb 3.2 oz (113.49 kg) IBW/kg (Calculated) : 70.7 Adjusted Body Weight:   Vital Signs: Temp: 97.9 F (36.6 C) (06/12 0437) Temp Source: Oral (06/12 0437) BP: 101/60 mmHg (06/12 0612) Pulse Rate: 86 (06/12 0612) Intake/Output from previous day: 06/11 0701 - 06/12 0700 In: 3 [I.V.:3] Out: -  Intake/Output from this shift: Total I/O In: -  Out: 200 [Urine:200]  Labs:  Recent Labs  04/07/15 1641 04/08/15 0428  WBC 4.3 3.8  HGB 15.3 14.7  PLT 102* 100*  CREATININE 1.37* 1.35*   Estimated Creatinine Clearance: 71.4 mL/min (by C-G formula based on Cr of 1.35). No results for input(s): VANCOTROUGH, VANCOPEAK, VANCORANDOM, GENTTROUGH, GENTPEAK, GENTRANDOM, TOBRATROUGH, TOBRAPEAK, TOBRARND, AMIKACINPEAK, AMIKACINTROU, AMIKACIN in the last 72 hours.   Microbiology: No results found for this or any previous visit (from the past 720 hour(s)).  Medical History: Past Medical History  Diagnosis Date  . Dysrhythmia   . Pneumonia 2011    Medications:  Scheduled:  . apixaban  5 mg Oral BID  . aspirin EC  81 mg Oral Daily  . diltiazem  30 mg Oral 4 times per day  . docusate sodium  100 mg Oral BID  . famotidine (PEPCID) IV  20 mg Intravenous Q12H  . furosemide  40 mg Intravenous Q12H  . metoprolol tartrate  25 mg Oral BID  . piperacillin-tazobactam (ZOSYN)  IV  3.375 g Intravenous 3 times per day  . sodium chloride  3 mL Intravenous Q12H  . vancomycin  1,500 mg Intravenous Q18H   Assessment: Zosyn and Vancomycin for wound infection/cellulitis    Plan:   Will continue Zosyn 3.375 g q8 hours.  Follow up culture results  Larene Beach, PharmD   04/08/2015,9:50 AM

## 2015-04-08 NOTE — Progress Notes (Signed)
[facility] at [place] NAME: Aaron Harrison    MR#:  [id number]  DATE OF BIRTH:  1951/06/07  SUBJECTIVE:  CHIEF COMPLAINT:   Chief Complaint  Patient presents with  . Wound Check   - Patient has been having bilateral lower extremity swelling for a few months now. He says he is not diagnosed with any medical problems prior to this admission and was not taking any medications. -Swelling got worse in the last week and the wound fluid blisters that ruptured and got infected.  REVIEW OF SYSTEMS:  Review of Systems  Constitutional: Negative for fever and chills.  Respiratory: Negative for cough, shortness of breath and wheezing.   Cardiovascular: Positive for leg swelling. Negative for chest pain and palpitations.  Gastrointestinal: Negative for nausea, vomiting, abdominal pain, diarrhea and constipation.  Genitourinary: Negative for dysuria.  Skin: Positive for rash.       Erythema of bilateral lower extremities, open superficially as multiple ulcerations with sloughing in the wound bed present. Purulent odor present.  Neurological: Negative for dizziness, seizures and headaches.    DRUG ALLERGIES:   Allergies  Allergen Reactions  . Penicillins Nausea And Vomiting    VITALS:  Blood pressure 101/60, pulse 86, temperature 97.9 F (36.6 C), temperature source Oral, resp. rate 18, height 5\' 9"  (1.753 m), weight 113.49 kg (250 lb 3.2 oz), SpO2 92 %.  PHYSICAL EXAMINATION:  Physical Exam  GENERAL:  64 y.o.-year-old patient lying in the bed with no acute distress.  EYES: Pupils equal, round, reactive to light and accommodation. No scleral icterus. Extraocular muscles intact.  HEENT: Head atraumatic, normocephalic. Oropharynx and nasopharynx clear.  NECK:  Supple, no jugular venous distention. No thyroid enlargement, no tenderness.  LUNGS: Normal breath sounds bilaterally, no wheezing, rales,rhonchi or crepitation. No use of accessory  muscles of respiration.  CARDIOVASCULAR: S1, S2 normal. No murmurs, rubs, or gallops.  ABDOMEN: Soft, nontender, nondistended. Bowel sounds present. No organomegaly or mass.  EXTREMITIES: Bilateral lower extremity 3+ edema, and significant erythema present. Multiple superficial open ulcerations on both legs below the knee, sloughing noted in the wound bed. Purulent odour noted. NEUROLOGIC: Cranial nerves II through XII are intact. Muscle strength 5/5 in all extremities. Sensation intact. Gait not checked.  PSYCHIATRIC: The patient is alert and oriented x 3.  SKIN: No obvious rash, lesion, or ulcer.    LABORATORY PANEL:   CBC  Recent Labs Lab 04/08/15 0428  WBC 3.8  HGB 14.7  HCT 44.5  PLT 100*   ------------------------------------------------------------------------------------------------------------------  Chemistries   Recent Labs Lab 04/07/15 1641 04/08/15 0428  NA 135 137  K 4.3 4.0  CL 102 105  CO2 22 25  GLUCOSE 136* 117*  BUN 22* 23*  CREATININE 1.37* 1.35*  CALCIUM 8.8* 8.1*  AST 18  --   ALT 13*  --   ALKPHOS 94  --   BILITOT 3.3*  --    ------------------------------------------------------------------------------------------------------------------  Cardiac Enzymes  Recent Labs Lab 04/07/15 1641  TROPONINI <0.03   ------------------------------------------------------------------------------------------------------------------  RADIOLOGY:  X-ray Chest Pa And Lateral  04/08/2015   CLINICAL DATA:  Congestive heart failure. Pneumonia with lower extremity cellulitis and stasis ulcers.  EXAM: CHEST  2 VIEW  COMPARISON:  04/07/2015 and 07/16/2013.  FINDINGS: There is stable cardiomegaly. Pulmonary edema and small bilateral pleural effusions have slightly worsened. There is no confluent airspace opacity or pneumothorax. The bones appear unchanged.  IMPRESSION: Slight worsening of congestive heart failure with persistent small  bilateral pleural effusions.    Electronically Signed   By: Richardean Sale M.D.   On: 04/08/2015 09:05   Dg Chest 2 View  04/07/2015   CLINICAL DATA:  Bilateral leg edema. History of congestive heart failure. Initial encounter.  EXAM: CHEST  2 VIEW  COMPARISON:  Radiographs 07/16/2013 and 12/09/2009.  FINDINGS: There is stable cardiomegaly with increased vascular congestion and mild interstitial edema. There are small bilateral pleural effusions. No confluent airspace opacity or pneumothorax. The bones appear unchanged.  IMPRESSION: Mild congestive heart failure with mild interstitial edema and small bilateral pleural effusions.   Electronically Signed   By: Richardean Sale M.D.   On: 04/07/2015 18:07    EKG:  No orders found for this or any previous visit.  ASSESSMENT AND PLAN:   64 year old male with past medical history significant for atrial fibrillation, not taking any medications at home presents to the hospital secondary to worsening lower extremity edema and also ulcerations.  #1 cellulitis of bilateral lower extremities with ulcerations-blood cultures are pending, wound cultures have been ordered. Continue vancomycin, added Zosyn and pharmacy consult for the same. -Wound care nurse consultation for dressing changes. -Continue Lasix for improvement in edema. -If no improvement noted, consult infectious diseases.  #2 congestive heart failure-possible diastolic dysfunction, echo is pending. -Continue Lasix at this time. Breathing is improved. Not requiring any supplemental oxygen. -Low salt diet advised and CHF education needed.  #3 chronic atrial fibrillation-not taking any medications at home. -Continue oral Cardizem and metoprolol started here. On eliquis for anticoagulation.  #4 DVT prophylaxis-on eliquis.   All the records are reviewed and case discussed with Care Management/Social Workerr. Management plans discussed with the patient, family and they are in agreement.  CODE STATUS: Full code  TOTAL  TIME TAKING CARE OF THIS PATIENT: 38 minutes.   POSSIBLE D/C IN 2-3 DAYS, DEPENDING ON CLINICAL CONDITION.   Gladstone Lighter M.D on 04/08/2015 at 9:46 AM  Between 7am to 6pm - Pager - [phone]  After 6pm go to [email or web address] - password [email or web address] [facility]  [facility]  Office  [phone]  CC: Primary care physician; No primary care provider on file.

## 2015-04-08 NOTE — Progress Notes (Signed)
Bedside report given to this RN by Lexi RN.  Pt care assumed.  Pt sitting up on the side of the bed, denies any pain or discomfort at this time.  Denies any other needs.  Continued monitoring.  Pt in NAD, skin warm and dry, respirations even and unlabored.  VSS, SR per monitor.

## 2015-04-09 LAB — BASIC METABOLIC PANEL
Anion gap: 10 (ref 5–15)
BUN: 28 mg/dL — ABNORMAL HIGH (ref 6–20)
CO2: 24 mmol/L (ref 22–32)
Calcium: 7.9 mg/dL — ABNORMAL LOW (ref 8.9–10.3)
Chloride: 102 mmol/L (ref 101–111)
Creatinine, Ser: 1.47 mg/dL — ABNORMAL HIGH (ref 0.61–1.24)
GFR calc Af Amer: 58 mL/min — ABNORMAL LOW (ref >60)
GFR calc non Af Amer: 50 mL/min — ABNORMAL LOW (ref >60)
GLUCOSE: 79 mg/dL (ref 65–99)
POTASSIUM: 3.8 mmol/L (ref 3.5–5.1)
SODIUM: 136 mmol/L (ref 135–145)

## 2015-04-09 LAB — CBC
HCT: 43.5 % (ref 40.0–52.0)
HEMOGLOBIN: 14 g/dL (ref 13.0–18.0)
MCH: 32.1 pg (ref 26.0–34.0)
MCHC: 32.1 g/dL (ref 32.0–36.0)
MCV: 99.9 fL (ref 80.0–100.0)
Platelets: 89 10*3/uL — ABNORMAL LOW (ref 150–440)
RBC: 4.35 MIL/uL — AB (ref 4.40–5.90)
RDW: 20.4 % — ABNORMAL HIGH (ref 11.5–14.5)
WBC: 2.9 10*3/uL — AB (ref 3.8–10.6)

## 2015-04-09 LAB — GLUCOSE, CAPILLARY: Glucose-Capillary: 96 mg/dL (ref 65–99)

## 2015-04-09 MED ORDER — FAMOTIDINE 20 MG PO TABS
20.0000 mg | ORAL_TABLET | Freq: Two times a day (BID) | ORAL | Status: DC
  Administered 2015-04-09: 20 mg via ORAL
  Filled 2015-04-09: qty 1

## 2015-04-09 MED ORDER — SODIUM CHLORIDE 0.9 % IJ SOLN
3.0000 mL | INTRAMUSCULAR | Status: DC | PRN
  Administered 2015-04-10 – 2015-04-11 (×2): 3 mL via INTRAVENOUS
  Filled 2015-04-09 (×2): qty 10

## 2015-04-09 NOTE — Progress Notes (Signed)
[facility] at [place] NAME: Aaron Harrison    MR#:  [id number]  DATE OF BIRTH:  18-Sep-1951  SUBJECTIVE:  CHIEF COMPLAINT:   Chief Complaint  Patient presents with  . Wound Check   feels his lower extremity swelling has gotten somewhat better,   REVIEW OF SYSTEMS:  Review of Systems  Constitutional: Positive for malaise/fatigue. Negative for fever, weight loss and diaphoresis.  HENT: Negative for ear discharge, ear pain, hearing loss, nosebleeds, sore throat and tinnitus.   Eyes: Negative for blurred vision and pain.  Respiratory: Negative for cough, hemoptysis, shortness of breath and wheezing.   Cardiovascular: Positive for leg swelling. Negative for chest pain, palpitations and orthopnea.  Gastrointestinal: Negative for heartburn, nausea, vomiting, abdominal pain, diarrhea, constipation and blood in stool.  Genitourinary: Negative for dysuria, urgency and frequency.  Musculoskeletal: Negative for myalgias and back pain.  Skin: Positive for itching and rash.  Neurological: Positive for weakness. Negative for dizziness, tingling, tremors, focal weakness, seizures and headaches.  Psychiatric/Behavioral: Negative for depression. The patient is not nervous/anxious.    DRUG ALLERGIES:   Allergies  Allergen Reactions  . Penicillins Nausea And Vomiting   VITALS:  Blood pressure 102/80, pulse 81, temperature 97.9 F (36.6 C), temperature source Oral, resp. rate 18, height 5\' 9"  (1.753 m), weight 114.17 kg (251 lb 11.2 oz), SpO2 100 %.  PHYSICAL EXAMINATION:  Physical Exam  Constitutional: He is oriented to person, place, and time and well-developed, well-nourished, and in no distress. Vital signs are normal.  HENT:  Head: Normocephalic and atraumatic.  Eyes: Conjunctivae and EOM are normal. Pupils are equal, round, and reactive to light.  Neck: Normal range of motion. Neck supple. No tracheal deviation present. No thyromegaly present.   Cardiovascular: Normal rate, regular rhythm and normal heart sounds.   Pulmonary/Chest: Effort normal and breath sounds normal. No respiratory distress. He has no wheezes. He exhibits no tenderness.  Abdominal: Soft. Bowel sounds are normal. He exhibits no distension. There is no tenderness.  Musculoskeletal: Normal range of motion.       Right shoulder: He exhibits no swelling.       Right ankle: He exhibits swelling (3+).       Left ankle: He exhibits swelling (3+ bilaterally).  Neurological: He is alert and oriented to person, place, and time. No cranial nerve deficit.  Skin: Skin is warm and dry. No rash noted.  Wound care nurse reports:  Left anterior calf 8 cm x 5 cm x 0.2 cm Left posterior calf 6 cm x 6 cm x 0.2 cm Right anterior calf 5.5 cm x 3 x 0.2 cm Right posterior calf 6 cm x 4 cm x 0.2 cm  Wound bed: all are ruddy red Drainage (amount, consistency, odor) Heavy serous weeping.  When I evaluated patient's both calfs are in dressing and has been soaked with serous discharge.  Psychiatric: Mood and affect normal.   LABORATORY PANEL:   CBC  Recent Labs Lab 04/09/15 0444  WBC 2.9*  HGB 14.0  HCT 43.5  PLT 89*   ------------------------------------------------------------------------------------------------------------------  Chemistries   Recent Labs Lab 04/07/15 1641  04/09/15 0444  NA 135  < > 136  K 4.3  < > 3.8  CL 102  < > 102  CO2 22  < > 24  GLUCOSE 136*  < > 79  BUN 22*  < > 28*  CREATININE 1.37*  < > 1.47*  CALCIUM 8.8*  < >  7.9*  AST 18  --   --   ALT 13*  --   --   ALKPHOS 94  --   --   BILITOT 3.3*  --   --   < > = values in this interval not displayed.  RADIOLOGY:  X-ray Chest Pa And Lateral  04/08/2015   CLINICAL DATA:  Congestive heart failure. Pneumonia with lower extremity cellulitis and stasis ulcers.  EXAM: CHEST  2 VIEW  COMPARISON:  04/07/2015 and 07/16/2013.  FINDINGS: There is stable cardiomegaly. Pulmonary edema and small  bilateral pleural effusions have slightly worsened. There is no confluent airspace opacity or pneumothorax. The bones appear unchanged.  IMPRESSION: Slight worsening of congestive heart failure with persistent small bilateral pleural effusions.   Electronically Signed   By: Richardean Sale M.D.   On: 04/08/2015 09:05   Dg Chest 2 View  04/07/2015   CLINICAL DATA:  Bilateral leg edema. History of congestive heart failure. Initial encounter.  EXAM: CHEST  2 VIEW  COMPARISON:  Radiographs 07/16/2013 and 12/09/2009.  FINDINGS: There is stable cardiomegaly with increased vascular congestion and mild interstitial edema. There are small bilateral pleural effusions. No confluent airspace opacity or pneumothorax. The bones appear unchanged.  IMPRESSION: Mild congestive heart failure with mild interstitial edema and small bilateral pleural effusions.   Electronically Signed   By: Richardean Sale M.D.   On: 04/07/2015 18:07    ASSESSMENT AND PLAN:   64 year old male with past medical history significant for atrial fibrillation, not taking any medications at home presents to the hospital secondary to worsening lower extremity edema and also ulcerations.  #1 cellulitis of bilateral lower extremities with ulcerations-blood and wound cultures have been ordered - pending at this time Continue vancomycin, added Zosyn and pharmacy consult for the same. -Wound care nurse input for dressing changes - appreciated -Continue Lasix for improvement in edema. -Consult infectious diseases.  #2 acute on chronic systolic congestive heart failure-possible diastolic dysfunction, echo showed moderately reduced overall left ventricular function ejection fraction between 30 and 35% -Continue Lasix at this time. Breathing is improved. Not requiring any supplemental oxygen. -Low salt diet advised and CHF education needed. - Appreciate cardiology input  #3 chronic atrial fibrillation-not taking any medications at home. -Continue  oral Cardizem and metoprolol started here. On eliquis for anticoagulation.  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   All the records are reviewed and case discussed with Care Management/Social Workerr. Management plans discussed with the patient and he is in agreement.  CODE STATUS: Full code  TOTAL TIME TAKING CARE OF THIS PATIENT: 35 minutes.   POSSIBLE D/C IN 1-2 DAYS, DEPENDING ON CLINICAL CONDITION.   [facility], Shanyiah Conde M.D on 04/09/2015 at 4:40 PM  Between 7am to 6pm - Pager - [phone]  After 6pm go to [email or web address] - password [email or web address]  [place] [facility]  Office  [phone]  CC: Primary care physician; No primary care provider on file.

## 2015-04-09 NOTE — Consult Note (Signed)
WOC wound consult note Reason for Consult: Bilateral edema and ulcerations lower extremities.  Possible CHF and atrial fibrillation Wound type: Cellulitis, no history of workup for venous or arterial insufficiency.  Patient states legs are not typically swollen. May require long term compression once diagnostic studies have been performed and determined it is safe to compress.  Pressure Ulcer POA: n/a Measurement:Left anterior calf 8 cm x 5 cm x 0.2 cm Left posterior calf 6 cm x 6 cm x 0.2 cm Right anterior calf 5.5 cm x 3 x 0.2 cm Right posterior calf 6 cm x 4 cm x 0.2 cm  Wound bed: all are ruddy red Drainage (amount, consistency, odor) Heavy serous weeping. Periwound:Generalized edema from knee to toes.   Dressing procedure/placement/frequency: Clean bilateral lower legs with soap and water and pat gently dry.  Apply Xeroform gauze to nonintact lesions.  Wrap legs with kerlix from just below toes to just below the knee.  Secure with tape.  Change daily.  Will not follow at this time.  Please re-consult if needed.  Domenic Moras RN BSN [place] Pager [phone]

## 2015-04-09 NOTE — Progress Notes (Signed)
Subjective:   patient states improved leg swelling his both legs are wrapped with reduced discharge and swelling denies shortness of breath or chest pain feels much improved  Objective:  Vital Signs in the last 24 hours: Temp:  [97.3 F (36.3 C)-97.9 F (36.6 C)] 97.9 F (36.6 C) (06/13 1100) Pulse Rate:  [81-90] 81 (06/13 0433) Resp:  [18] 18 (06/13 1100) BP: (102-116)/(59-80) 102/80 mmHg (06/13 1100) SpO2:  [94 %-100 %] 100 % (06/13 1100) Weight:  [114.17 kg (251 lb 11.2 oz)] 114.17 kg (251 lb 11.2 oz) (06/13 0433)  Intake/Output from previous day: 06/12 0701 - 06/13 0700 In: 1200 [P.O.:1200] Out: 2500 [Urine:2500] Intake/Output from this shift: Total I/O In: 840 [P.O.:840] Out: 1350 [Urine:1350]  Physical Exam: General appearance: alert, cooperative and appears stated age Neck: no adenopathy, no carotid bruit, no JVD, supple, symmetrical, trachea midline and thyroid not enlarged, symmetric, no tenderness/mass/nodules Lungs: clear to auscultation bilaterally Heart: regular rate and rhythm, S1, S2 normal, no murmur, click, rub or gallop Abdomen: soft, non-tender; bowel sounds normal; no masses,  no organomegaly Extremities: edema Cellulitis discharge swelling legs and now wrapped and venous stasis dermatitis noted Pulses: 2+ and symmetric Skin: Skin color, texture, turgor normal. No rashes or lesions or erythema - lower leg(s) bilateral and excoriation - lower leg(s) bilateral Neurologic: Alert and oriented X 3, normal strength and tone. Normal symmetric reflexes. Normal coordination and gait  Lab Results:  Recent Labs  04/08/15 0428 04/09/15 0444  WBC 3.8 2.9*  HGB 14.7 14.0  PLT 100* 89*    Recent Labs  04/08/15 0428 04/09/15 0444  NA 137 136  K 4.0 3.8  CL 105 102  CO2 25 24  GLUCOSE 117* 79  BUN 23* 28*  CREATININE 1.35* 1.47*    Recent Labs  04/07/15 1641  TROPONINI <0.03   Hepatic Function Panel  Recent Labs  04/07/15 1641  PROT 7.4   ALBUMIN 3.7  AST 18  ALT 13*  ALKPHOS 94  BILITOT 3.3*   No results for input(s): CHOL in the last 72 hours. No results for input(s): PROTIME in the last 72 hours.  Imaging: Imaging results have been reviewed  Cardiac Studies:  Assessment/Plan:  Cardiomyopathy Edema Shortness of Breath   cellulitis  venous stasis  atrial fibrillation  obesity  congestive heart failure . Chronic renal sufficiency . PLAN  broad-spectrum antibiotic therapy for cellulitis  Diuresis  continue wrap legs for venous stasis changes  hypertension control with metoprolol  for rate control for atrial fibrillation with metoprolol  aspirin therapy for arteriosclerotic vascular disease  recommend evaluation by vascular resident patient outpatient venous stasis changes  echocardiogram will be helpful for assessment of LV function wall motion and heart failure  consider long-term anticoagulation for atrial fibrillation  consider functional study as an outpatient for coronary artery disease   LOS: 2 days    Austine Kelsay D. 04/09/2015, 4:05 PM

## 2015-04-09 NOTE — Progress Notes (Signed)
Initial Nutrition Assessment    INTERVENTION:  Meals and Snacks: Cater to patient preferences  NUTRITION DIAGNOSIS:  No PES statement at this time   GOAL:  Patient will meet greater than or equal to 90% of their needs  MONITOR:   (Energy Intake, Anthropometrics, Electrolyte/Renal Profile)  REASON FOR ASSESSMENT:   (CHF Diagnosis)    ASSESSMENT:  Pt admitted with CHF  PMH: afib, venous statis ulcers, CHF  Diet Order: 2g sodium  Energy Intake: recorded po intake 90-100% of meals  Previous Nutritional Intake: pt eating well at home prior to admission Electrolyte and Renal Profile:  Recent Labs Lab 04/07/15 1641 04/08/15 0428 04/09/15 0444  BUN 22* 23* 28*  CREATININE 1.37* 1.35* 1.47*  NA 135 137 136  K 4.3 4.0 3.8   Glucose Profile:  Recent Labs  04/07/15 1638 04/08/15 0713 04/09/15 0725  GLUCAP 119* 125* 96   Nutritional Anemia Profile:  CBC Latest Ref Rng 04/09/2015 04/08/2015 04/07/2015  WBC 3.8 - 10.6 K/uL 2.9(L) 3.8 4.3  Hemoglobin 13.0 - 18.0 g/dL 14.0 14.7 15.3  Hematocrit 40.0 - 52.0 % 43.5 44.5 47.2  Platelets 150 - 440 K/uL 89(L) 100(L) 102(L)    Meds: lasix, colace   Height:  Ht Readings from Last 1 Encounters:  04/07/15 5\' 9"  (1.753 m)    Weight:  Wt Readings from Last 1 Encounters:  04/09/15 251 lb 11.2 oz (114.17 kg)    UBW: pt reports stable weight, maybe a little weight gain   Filed Weights   04/07/15 2241 04/08/15 0437 04/09/15 0433  Weight: 250 lb 12.8 oz (113.762 kg) 250 lb 3.2 oz (113.49 kg) 251 lb 11.2 oz (114.17 kg)     BMI:  Body mass index is 37.15 kg/(m^2).  Skin:  Ulcerations on legs  Diet Order:  Diet 2 gram sodium Room service appropriate?: Yes; Fluid consistency:: Thin  EDUCATION NEEDS:  Pt reports he understands current diet order; pt has no questions regarding diet at this time   Intake/Output Summary (Last 24 hours) at 04/09/15 1634 Last data filed at 04/09/15 1406  Gross per 24 hour   Intake    840 ml  Output   3400 ml  Net  -2560 ml    LOW Care Level  Kerman Passey MS, RD, LDN [phone] Pager

## 2015-04-09 NOTE — Care Management (Signed)
Patient presents from home.  Says that he notice had some swelling and a red place on his left leg about 1 month agao then it went away a few days later.  He awoke a few days ago with swelling in both legs "out of the blue" and legs were red and weeping.  Says that he was unable to button his pants he had so much swelling around his abdomen.  He does not have a PCP, on no medications and no insurance.  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  He "got behind" because he had to pay for 3 funerals for 3 close family members that died within a short time span.  Provided patient with application to [facility].  Says he has been referred before "but they kept calling and changing my appointment to a time when I was at work.  Discussed that clinic appointments are ususally in the evening after schools have closed.  He admits he never called to follow up.  Discussed medications and will assess at discharge of meds that he can pay for and ones that will require assist.  Discussed the possibility of home health to follow up leg wounds but would have to pay privately if did not qualify for charity care.  Left voicemail message with [facility].  Also discussed the possibility of wound clinic

## 2015-04-09 NOTE — Care Management (Signed)
CM will assess due to lack of payor source and new diagnosis.  Has bilateral wounds on lower extremities due to swelling.  Vascular is recommending unna boots and outpatient vascular work up.  Currently on IV antibiotics

## 2015-04-10 LAB — RAPID HIV SCREEN (HIV 1/2 AB+AG)
HIV 1/2 ANTIBODIES: NONREACTIVE
HIV-1 P24 ANTIGEN - HIV24: NONREACTIVE
Interpretation (HIV Ag Ab): NONREACTIVE

## 2015-04-10 LAB — GLUCOSE, CAPILLARY: Glucose-Capillary: 78 mg/dL (ref 65–99)

## 2015-04-10 LAB — VANCOMYCIN, TROUGH: VANCOMYCIN TR: 29 ug/mL — AB (ref 10–20)

## 2015-04-10 NOTE — Progress Notes (Signed)
S: Patient states he is feeling better his legs continue to drain however they're much less painful  O: Vital signs stable, afebrile       Both legs are wrapped with Kerlix and Xeroform gauze there is moderate drainage staining the gauze. There is 2+ edema of the knee and thigh bilaterally  A: Lymphedema with venous insufficiency and cellulitis  P: Plan is to continue antibiotics per medical service continue wound care I will find out from social services tomorrow whether UNNA wraps are planned as that is what I would prefer and would be most manageable for the patient as a weekly dressing change. Hopefully, I can see him back in the office and make arrangements for a lymph pump as well as graduated compression stockings.

## 2015-04-10 NOTE — Plan of Care (Signed)
Problem: Discharge Progression Outcomes Goal: Other Discharge Outcomes/Goals Outcome: Progressing Patient was transferred to unit from 2a today  Patient continues on IV ABX and Lasix Wound care dressing changed done earlier today Patient has no c/o pain  Patient ambulates without assistance

## 2015-04-10 NOTE — Plan of Care (Signed)
Problem: Discharge Progression Outcomes Goal: Discharge plan in place and appropriate Outcome: Progressing Patient goes by Aaron Harrison Patients expects discharge to home  Goal: Hemodynamically stable Outcome: Progressing VSS, no c/o pain or discomfort  Goal: Tolerating diet Outcome: Completed/Met Date Met:  04/10/15 Patient is tolerating diet well

## 2015-04-10 NOTE — Care Management (Signed)
Patient has been approved for charity care through [facility].  Patient will need to have PCP.  Spoke with attending about importance of speaking directly with CM at discharge to determine extent of medication assistance.  Spoke particularly about the Eliquis and antibiotic.  Eliquis is not a new med for patient but patient says it is new.  He says has not used the coupon but if he has, drugstore will not fill without payment.  Have [facility] with [facility] to see if they have Eliquis on hand and left [facility] application in medical record.  Discussed the need for attending to complete the [facility] drug assistance form.  Have reminded patient of need to complete application for [facility] and [facility].  Sent email referral to [facility].

## 2015-04-10 NOTE — Consult Note (Signed)
[facility] Infectious Disease     Reason for Consult:LE cellulitis    Referring Physician: Max Sane Date of Admission:  04/07/2015   Principal Problem:   CHF (congestive heart failure) Active Problems:   Cellulitis of lower extremity   Atrial fibrillation, chronic   Stasis ulcer of lower extremity   Bilateral lower extremity edema   Bilateral edema of lower extremity   HPI: Aaron Harrison is a 64 y.o. male with hx A fib admitted 6/11 with increased edema and LE cellulitis.  He was found to have CHF with EF 35%.   He reports being in usual state of health until 2 months ago when had a few spots on legs appear. Showed it to school RN and she told him may be shingles. Resolved but then about 2 weeks ago got swelling and skin breakdown. Has been treated with diuresis, vanco and zosyn. Has been seen by vascular and cardiology   Past Medical History  Diagnosis Date  . Dysrhythmia   . Pneumonia 2011   History reviewed. No pertinent past surgical history. History  Substance Use Topics  . Smoking status: Never Smoker   . Smokeless tobacco: Never Used  . Alcohol Use: No   History reviewed. No pertinent family history.  Allergies:  Allergies  Allergen Reactions  . Penicillins Nausea And Vomiting    Current antibiotics: Antibiotics Given (last 72 hours)    Date/Time Action Medication Dose Rate   04/08/15 0614 Given   vancomycin (VANCOCIN) 1,500 mg in sodium chloride 0.9 % 500 mL IVPB 1,500 mg 250 mL/hr   04/08/15 1452 Given   piperacillin-tazobactam (ZOSYN) IVPB 3.375 g 3.375 g 12.5 mL/hr   04/08/15 2223 Given   piperacillin-tazobactam (ZOSYN) IVPB 3.375 g 3.375 g 12.5 mL/hr   04/09/15 0105 Given   vancomycin (VANCOCIN) 1,500 mg in sodium chloride 0.9 % 500 mL IVPB 1,500 mg 250 mL/hr   04/09/15 0553 Given   piperacillin-tazobactam (ZOSYN) IVPB 3.375 g 3.375 g 12.5 mL/hr   04/09/15 1411 Given   piperacillin-tazobactam (ZOSYN) IVPB 3.375 g 3.375 g 12.5 mL/hr   04/09/15  1732 Given   vancomycin (VANCOCIN) 1,500 mg in sodium chloride 0.9 % 500 mL IVPB 1,500 mg 250 mL/hr   04/09/15 2245 Given   piperacillin-tazobactam (ZOSYN) IVPB 3.375 g 3.375 g 12.5 mL/hr   04/10/15 0555 Given   piperacillin-tazobactam (ZOSYN) IVPB 3.375 g 3.375 g 12.5 mL/hr   04/10/15 1405 Given   piperacillin-tazobactam (ZOSYN) IVPB 3.375 g 3.375 g 12.5 mL/hr      MEDICATIONS: . apixaban  5 mg Oral BID  . aspirin EC  81 mg Oral Daily  . docusate sodium  100 mg Oral BID  . furosemide  40 mg Intravenous Q12H  . metoprolol tartrate  25 mg Oral BID  . piperacillin-tazobactam (ZOSYN)  IV  3.375 g Intravenous 3 times per day    Review of Systems - 11 systems reviewed and negative per HPI   OBJECTIVE: Temp:  [97.5 F (36.4 C)-98.4 F (36.9 C)] 97.5 F (36.4 C) (06/14 1302) Pulse Rate:  [69-104] 96 (06/14 1256) Resp:  [17-20] 18 (06/14 1256) BP: (108-132)/(72-105) 132/81 mmHg (06/14 1256) SpO2:  [96 %-99 %] 98 % (06/14 1256) Weight:  [108.818 kg (239 lb 14.4 oz)] 108.818 kg (239 lb 14.4 oz) (06/14 0410) Physical Exam  Constitutional: He is oriented to person, place, and time. Obese.    HENT:  Mouth/Throat: Oropharynx is clear and moist. No oropharyngeal exudate.  Cardiovascular: regular, 2/6 sm  Pulmonary/Chest: Poor air movement Abdominal: obese Soft. Bowel sounds are normal. He exhibits no distension. There is no tenderness.  Lymphadenopathy: He has no cervical adenopathy.  EXT 2+ edema bil le Neurological: He is alert and oriented to person, place, and time.  Skin: bil LE with open wounds with some drainage and surrounding erytyema.  Psychiatric: He has a normal mood and affect. His behavior is normal.   LABS: Results for orders placed or performed during the hospital encounter of 04/07/15 (from the past 48 hour(s))  CBC     Status: Abnormal   Collection Time: 04/09/15  4:44 AM  Result Value Ref Range   WBC 2.9 (L) 3.8 - 10.6 K/uL   RBC 4.35 (L) 4.40 - 5.90 MIL/uL    Hemoglobin 14.0 13.0 - 18.0 g/dL   HCT 43.5 40.0 - 52.0 %   MCV 99.9 80.0 - 100.0 fL   MCH 32.1 26.0 - 34.0 pg   MCHC 32.1 32.0 - 36.0 g/dL   RDW 20.4 (H) 11.5 - 14.5 %   Platelets 89 (L) 150 - 440 K/uL  Basic metabolic panel     Status: Abnormal   Collection Time: 04/09/15  4:44 AM  Result Value Ref Range   Sodium 136 135 - 145 mmol/L   Potassium 3.8 3.5 - 5.1 mmol/L   Chloride 102 101 - 111 mmol/L   CO2 24 22 - 32 mmol/L   Glucose, Bld 79 65 - 99 mg/dL   BUN 28 (H) 6 - 20 mg/dL   Creatinine, Ser 1.47 (H) 0.61 - 1.24 mg/dL   Calcium 7.9 (L) 8.9 - 10.3 mg/dL   GFR calc non Af Amer 50 (L) >60 mL/min   GFR calc Af Amer 58 (L) >60 mL/min    Comment: (NOTE) The eGFR has been calculated using the CKD EPI equation. This calculation has not been validated in all clinical situations. eGFR's persistently <60 mL/min signify possible Chronic Kidney Disease.    Anion gap 10 5 - 15  Glucose, capillary     Status: None   Collection Time: 04/09/15  7:25 AM  Result Value Ref Range   Glucose-Capillary 96 65 - 99 mg/dL   Comment 1 Notify RN   Wound culture     Status: None (Preliminary result)   Collection Time: 04/09/15  8:21 AM  Result Value Ref Range   Specimen Description ULCER    Special Requests NONE    Gram Stain PENDING    Culture      HEAVY GROWTH GRAM NEGATIVE RODS IDENTIFICATION AND SUSCEPTIBILITIES TO FOLLOW    Report Status PENDING   Glucose, capillary     Status: None   Collection Time: 04/10/15  8:51 AM  Result Value Ref Range   Glucose-Capillary 78 65 - 99 mg/dL   No components found for: ESR, C REACTIVE PROTEIN MICRO: Recent Results (from the past 720 hour(s))  Culture, blood (routine x 2)     Status: None (Preliminary result)   Collection Time: 04/07/15  4:35 PM  Result Value Ref Range Status   Specimen Description BLOOD  Final   Special Requests NONE  Final   Culture NO GROWTH 3 DAYS  Final   Report Status PENDING  Incomplete  Culture, blood (routine x 2)      Status: None (Preliminary result)   Collection Time: 04/07/15  4:41 PM  Result Value Ref Range Status   Specimen Description BLOOD  Final   Special Requests NONE  Final   Culture NO  GROWTH 3 DAYS  Final   Report Status PENDING  Incomplete  Wound culture     Status: None (Preliminary result)   Collection Time: 04/09/15  8:21 AM  Result Value Ref Range Status   Specimen Description ULCER  Final   Special Requests NONE  Final   Gram Stain PENDING  Incomplete   Culture   Final    HEAVY GROWTH GRAM NEGATIVE RODS IDENTIFICATION AND SUSCEPTIBILITIES TO FOLLOW    Report Status PENDING  Incomplete    IMAGING: X-ray Chest Pa And Lateral  04/08/2015   CLINICAL DATA:  Congestive heart failure. Pneumonia with lower extremity cellulitis and stasis ulcers.  EXAM: CHEST  2 VIEW  COMPARISON:  04/07/2015 and 07/16/2013.  FINDINGS: There is stable cardiomegaly. Pulmonary edema and small bilateral pleural effusions have slightly worsened. There is no confluent airspace opacity or pneumothorax. The bones appear unchanged.  IMPRESSION: Slight worsening of congestive heart failure with persistent small bilateral pleural effusions.   Electronically Signed   By: Richardean Sale M.D.   On: 04/08/2015 09:05   Dg Chest 2 View  04/07/2015   CLINICAL DATA:  Bilateral leg edema. History of congestive heart failure. Initial encounter.  EXAM: CHEST  2 VIEW  COMPARISON:  Radiographs 07/16/2013 and 12/09/2009.  FINDINGS: There is stable cardiomegaly with increased vascular congestion and mild interstitial edema. There are small bilateral pleural effusions. No confluent airspace opacity or pneumothorax. The bones appear unchanged.  IMPRESSION: Mild congestive heart failure with mild interstitial edema and small bilateral pleural effusions.   Electronically Signed   By: Richardean Sale M.D.   On: 04/07/2015 18:07   Echo 04/08/15 Study Conclusions - Left ventricle: The cavity size was normal. Systolic function  was moderately to severely reduced. The estimated ejection fraction was in the range of 30% to 35%. Wall motion was normal; there were no regional wall motion abnormalities. - Aortic valve: There was mild regurgitation. Impressions: - moderately reduced overall left ventricular function ejection fraction between 30 and 35%  Assessment:   BRYEN HINDERMAN is a 64 y.o. male admitted with impressive LE edema and cellulitis with ulceration.  He has EF 35 %.  BCX neg, Wound cx with GNR.  He also has TCP and low WBC.   Recommendations Continue current abx- pending wound culture. Hopefully will be able to transition to orals based on culture. Continue diuresis and wraps - agree with vascular that would be best to dc on unawraps as well Will check HIV and Hep C.  Unclear cause of TCP - could he have cirrhosis?? Thank you very much for allowing me to participate in the care of this patient. Please call with questions.   Cheral Marker. Ola Spurr, MD

## 2015-04-10 NOTE — Care Management (Signed)
Per Aaron Harrison with [facility] [place] [facility] does have Eliquis on hand

## 2015-04-10 NOTE — Progress Notes (Signed)
Aaron Harrison at [place] NAME: Aaron Harrison    MR#:  [id number]  DATE OF BIRTH:  [birth year]  SUBJECTIVE:  CHIEF COMPLAINT:   Chief Complaint  Patient presents with  . Wound Check   feels his lower extremity swelling is improving.  His wounds are getting much better  REVIEW OF SYSTEMS:  Review of Systems  Constitutional: Positive for malaise/fatigue. Negative for fever, weight loss and diaphoresis.  HENT: Negative for ear discharge, ear pain, hearing loss, nosebleeds, sore throat and tinnitus.   Eyes: Negative for blurred vision and pain.  Respiratory: Negative for cough, hemoptysis, shortness of breath and wheezing.   Cardiovascular: Positive for leg swelling. Negative for chest pain, palpitations and orthopnea.  Gastrointestinal: Negative for heartburn, nausea, vomiting, abdominal pain, diarrhea, constipation and blood in stool.  Genitourinary: Negative for dysuria, urgency and frequency.  Musculoskeletal: Negative for myalgias and back pain.  Skin: Positive for itching and rash.  Neurological: Positive for weakness. Negative for dizziness, tingling, tremors, focal weakness, seizures and headaches.  Psychiatric/Behavioral: Negative for depression. The patient is not nervous/anxious.    DRUG ALLERGIES:   Allergies  Allergen Reactions  . Penicillins Nausea And Vomiting   VITALS:  Blood pressure 132/81, pulse 96, temperature 97.5 F (36.4 C), temperature source Oral, resp. rate 18, height 5\' 9"  (1.753 m), weight 108.818 kg (239 lb 14.4 oz), SpO2 98 %.  PHYSICAL EXAMINATION:  Physical Exam  Constitutional: He is oriented to person, place, and time and well-developed, well-nourished, and in no distress. Vital signs are normal.  HENT:  Head: Normocephalic and atraumatic.  Eyes: Conjunctivae and EOM are normal. Pupils are equal, round, and reactive to light.  Neck: Normal range of motion. Neck supple. No tracheal deviation present. No  thyromegaly present.  Cardiovascular: Normal rate, regular rhythm and normal heart sounds.   Pulmonary/Chest: Effort normal and breath sounds normal. No respiratory distress. He has no wheezes. He exhibits no tenderness.  Abdominal: Soft. Bowel sounds are normal. He exhibits no distension. There is no tenderness.  Musculoskeletal: Normal range of motion.       Right shoulder: He exhibits no swelling.       Right ankle: He exhibits swelling (3+).       Left ankle: He exhibits swelling (3+ bilaterally).  Neurological: He is alert and oriented to person, place, and time. No cranial nerve deficit.  Skin: Skin is warm and dry. No rash noted.  Wound care nurse reports:  Left anterior calf 8 cm x 5 cm x 0.2 cm Left posterior calf 6 cm x 6 cm x 0.2 cm Right anterior calf 5.5 cm x 3 x 0.2 cm Right posterior calf 6 cm x 4 cm x 0.2 cm  Wound bed: all are ruddy red Drainage (amount, consistency, odor) Heavy serous weeping.  When I evaluated patient's both calfs are in dressing and has been soaked with serous discharge.  Psychiatric: Mood and affect normal.   LABORATORY PANEL:   CBC  Recent Labs Lab 04/09/15 0444  WBC 2.9*  HGB 14.0  HCT 43.5  PLT 89*   ------------------------------------------------------------------------------------------------------------------  Chemistries   Recent Labs Lab 04/07/15 1641  04/09/15 0444  NA 135  < > 136  K 4.3  < > 3.8  CL 102  < > 102  CO2 22  < > 24  GLUCOSE 136*  < > 79  BUN 22*  < > 28*  CREATININE 1.37*  < > 1.47*  CALCIUM  8.8*  < > 7.9*  AST 18  --   --   ALT 13*  --   --   ALKPHOS 94  --   --   BILITOT 3.3*  --   --   < > = values in this interval not displayed.   ASSESSMENT AND PLAN:   64 year old male with past medical history significant for atrial fibrillation, not taking any medications at home presents to the hospital secondary to worsening lower extremity edema and also ulcerations.  #1 cellulitis of bilateral lower  extremities with ulcerations-blood and wound cultures have been ordered - pending at this time Continue vancomycin, added Zosyn and pharmacy consult for the same. -Wound care nurse input for dressing changes - appreciated -Continue Lasix for improvement in edema. -Consult infectious diseases - pending  #2 acute on chronic systolic congestive heart failure-possible diastolic dysfunction, echo showed moderately reduced overall left ventricular function ejection fraction between 30 and 35% -Continue Lasix at this time. Breathing is improved. Not requiring any supplemental oxygen. -Low salt diet advised and CHF education needed. - Appreciate cardiology input - 3.3 liters negative  #3 chronic atrial fibrillation-not taking any medications at home. -Continue oral Cardizem and metoprolol started here. On eliquis for anticoagulation.  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   All the records are reviewed and case discussed with Care Management/Social Workerr. Management plans discussed with the patient and he is in agreement.  CODE STATUS: Full code  TOTAL TIME TAKING CARE OF THIS PATIENT: 35 minutes.   POSSIBLE D/C TOMORROW, DEPENDING ON CLINICAL CONDITION - pending infectious disease evaluation   [facility], Laporchia Nakajima M.D on 04/10/2015 at 2:23 PM  Between 7am to 6pm - Pager - [phone]  After 6pm go to [email or web address] - password [email or web address] [facility]  [place] [facility]  Office  [phone]  CC: Primary care physician; No primary care provider on file.

## 2015-04-10 NOTE — Clinical Social Work Note (Addendum)
Clinical Social Worker received consult for SNF. RNCM is following for home needs. No skilled need identified at this time. CSW will sign off at this time. Please reconsult if a need arises prior to discharge.   Darden Dates, MSW, LCSW Clinical Social Worker  [phone]  Addendum: PT is not recommending any follow up.

## 2015-04-10 NOTE — Plan of Care (Signed)
Problem: Discharge Progression Outcomes Goal: Pain controlled with appropriate interventions Outcome: Completed/Met Date Met:  04/10/15 No  C/o pain

## 2015-04-11 LAB — BASIC METABOLIC PANEL
ANION GAP: 10 (ref 5–15)
BUN: 26 mg/dL — ABNORMAL HIGH (ref 6–20)
CO2: 35 mmol/L — ABNORMAL HIGH (ref 22–32)
CREATININE: 1.65 mg/dL — AB (ref 0.61–1.24)
Calcium: 8.5 mg/dL — ABNORMAL LOW (ref 8.9–10.3)
Chloride: 94 mmol/L — ABNORMAL LOW (ref 101–111)
GFR calc non Af Amer: 43 mL/min — ABNORMAL LOW (ref >60)
GFR, EST AFRICAN AMERICAN: 50 mL/min — AB (ref >60)
Glucose, Bld: 87 mg/dL (ref 65–99)
Potassium: 3.2 mmol/L — ABNORMAL LOW (ref 3.5–5.1)
Sodium: 139 mmol/L (ref 135–145)

## 2015-04-11 LAB — HEPATITIS C ANTIBODY (REFLEX): HCV Ab: <0.1 s/co ratio (ref 0.0–0.9)

## 2015-04-11 LAB — CBC
HEMATOCRIT: 46 % (ref 40.0–52.0)
Hemoglobin: 15.2 g/dL (ref 13.0–18.0)
MCH: 31.7 pg (ref 26.0–34.0)
MCHC: 33 g/dL (ref 32.0–36.0)
MCV: 96 fL (ref 80.0–100.0)
PLATELETS: 110 10*3/uL — AB (ref 150–440)
RBC: 4.79 MIL/uL (ref 4.40–5.90)
RDW: 19.6 % — AB (ref 11.5–14.5)
WBC: 2.8 10*3/uL — AB (ref 3.8–10.6)

## 2015-04-11 LAB — HCV COMMENT:

## 2015-04-11 LAB — GLUCOSE, CAPILLARY
GLUCOSE-CAPILLARY: 75 mg/dL (ref 65–99)
GLUCOSE-CAPILLARY: 86 mg/dL (ref 65–99)

## 2015-04-11 MED ORDER — SULFAMETHOXAZOLE-TRIMETHOPRIM 800-160 MG PO TABS
1.0000 | ORAL_TABLET | Freq: Two times a day (BID) | ORAL | Status: DC
Start: 2015-04-11 — End: 2015-04-11

## 2015-04-11 MED ORDER — FUROSEMIDE 20 MG PO TABS: 20.0000 mg | ORAL_TABLET | Freq: Every day | ORAL | Status: DC

## 2015-04-11 MED ORDER — CIPROFLOXACIN HCL 250 MG PO TABS: 250.0000 mg | ORAL_TABLET | Freq: Two times a day (BID) | ORAL | Status: DC

## 2015-04-11 MED ORDER — POTASSIUM CHLORIDE CRYS ER 20 MEQ PO TBCR
20.0000 meq | EXTENDED_RELEASE_TABLET | Freq: Once | ORAL | Status: AC
  Administered 2015-04-11: 20 meq via ORAL
  Filled 2015-04-11: qty 1

## 2015-04-11 MED ORDER — SULFAMETHOXAZOLE-TRIMETHOPRIM 800-160 MG PO TABS: 1.0000 | ORAL_TABLET | Freq: Two times a day (BID) | ORAL | Status: DC

## 2015-04-11 MED ORDER — POTASSIUM CHLORIDE CRYS ER 20 MEQ PO TBCR
40.0000 meq | EXTENDED_RELEASE_TABLET | Freq: Once | ORAL | Status: AC
  Administered 2015-04-11: 40 meq via ORAL
  Filled 2015-04-11: qty 2

## 2015-04-11 NOTE — Progress Notes (Signed)
Notified Dr Reece Levy that lasix 40mg  needs to be administered, potassium is 3.2. MD verbalized to order K-DUR 92mEq once.

## 2015-04-11 NOTE — Progress Notes (Signed)
Wound change done as ordered and UNA boots applied and patient was shown how to do dressing change  MD discharged patient to home today, prescriptions electronically submitted to [facility] on [address]

## 2015-04-11 NOTE — Consult Note (Signed)
WOC wound follow up Wound type: Lymphedema with cellulitis.  Discharge home today with [facility].  Measurement:Not assessed.  Erythema improving.  Edema present.  Generalized, from feet to knee.   Wound bed: Ruddy red with scattered yellow tissue present.  Drainage (amount, consistency, odor) Heavy serous weeping.  Periwound: Erythema Dressing procedure/placement/frequency: Cleanse bilateral legs with soap and water and pat gently dry.  Apply Xeroform gauze to wound bed.  Apply zinc oxide wraps, from base of toes to just below the knee.Next, Abd pads over nonintact areas.  Wrap with kerlix from base of toes to just below knees.  Secure with Coban.  Change 3xweekly.  Due again with [facility] on Friday. To follow up with vascular MD for ongoing therapy.   Will not follow at this time.  Please re-consult if needed.  Domenic Moras RN BSN [place] Pager [phone]

## 2015-04-11 NOTE — Care Management (Addendum)
Spoke with Aaron Harrison at the bedside. Will be followed by [facility] for Wound care. Aaron Harrison, representative for [facility] updated. Aaron Harrison will be applying una boots prior to discharge. Aaron Harrison will be able pay for his medications, if on the $4.00 list at [facility]. Prescriptions electronically submitted to [facility] on [address].Will follow-up at [facility]. Telephone call to Dr. Manuella Ghazi to discuss discharge medications.  Aaron Harrison states he has transportation home. Shelbie Ammons RN MSN Care Management [phone]

## 2015-04-11 NOTE — Discharge Instructions (Signed)
Cellulitis Cellulitis is an infection of the skin and the tissue under the skin. The infected area is usually red and tender. This happens most often in the arms and lower legs. HOME CARE   Take your antibiotic medicine as told. Finish the medicine even if you start to feel better.  Keep the infected arm or leg raised (elevated).  Put a warm cloth on the area up to 4 times per day.  Only take medicines as told by your doctor.  Keep all doctor visits as told. GET HELP IF:  You see red streaks on the skin coming from the infected area.  Your red area gets bigger or turns a dark color.  Your bone or joint under the infected area is painful after the skin heals.  Your infection comes back in the same area or different area.  You have a puffy (swollen) bump in the infected area.  You have new symptoms.  You have a fever. GET HELP RIGHT AWAY IF:   You feel very sleepy.  You throw up (vomit) or have watery poop (diarrhea).  You feel sick and have muscle aches and pains. MAKE SURE YOU:   Understand these instructions.  Will watch your condition.  Will get help right away if you are not doing well or get worse. Document Released: 03/31/2008 Document Revised: 02/27/2014 Document Reviewed: 12/29/2011 [facility] Patient Information 2015 [place], [place]. This information is not intended to replace advice given to you by your health care provider. Make sure you discuss any questions you have with your health care provider.  Peripheral Vascular Disease  Peripheral vascular disease (PVD) is caused by cholesterol buildup in the arteries. The arteries become narrow or clogged. This makes it hard for blood to flow. It happens most in the legs, but it can occur in other areas of your body. HOME CARE   Quit smoking, if you smoke.  Exercise as told by your doctor.  Follow a low-fat, low-cholesterol diet as told by your doctor.  Control your diabetes, if you have diabetes.  Care for  your feet to prevent infection.  Only take medicine as told by your doctor. GET HELP RIGHT AWAY IF:   You have pain or lose feeling (numbness) in your arms or legs.  Your arms or legs turn cold or blue.  You have redness, warmth, and puffiness (swelling) in your arms or legs. MAKE SURE YOU:   Understand these instructions.  Will watch your condition.  Will get help right away if you are not doing well or get worse. Document Released: 01/07/2010 Document Revised: 01/05/2012 Document Reviewed: 01/07/2010 [facility] Patient Information 2015 [place], [place]. This information is not intended to replace advice given to you by your health care provider. Make sure you discuss any questions you have with your health care provider.

## 2015-04-11 NOTE — Discharge Summary (Signed)
[place] at [place] NAME: Aaron Harrison    MR#:  [id number]  DATE OF BIRTH:  December 13, 1950  DATE OF ADMISSION:  04/07/2015 ADMITTING PHYSICIAN: Bettey Costa, MD  DATE OF DISCHARGE: 04/11/2015  PRIMARY CARE PHYSICIAN: [facility]   ADMISSION DIAGNOSIS:  Atrial fibrillation with rapid ventricular response [I48.91] Bilateral edema of lower extremity [R60.0] Bilateral cellulitis of lower leg [L03.116, L03.115]  DISCHARGE DIAGNOSIS:  Principal Problem:   CHF (congestive heart failure) Active Problems:   Cellulitis of lower extremity   Atrial fibrillation, chronic   Stasis ulcer of lower extremity   Bilateral lower extremity edema   Bilateral edema of lower extremity  SECONDARY DIAGNOSIS:   Past Medical History  Diagnosis Date  . Dysrhythmia   . Pneumonia 2011   HOSPITAL COURSE:  64 year old male with past medical history significant for atrial fibrillation, not taking any medications at home presents to the hospital secondary to worsening lower extremity edema and also ulcerations.  #1 cellulitis of bilateral lower extremities with ulcerations- will need dressing changes and complete course of oral antibiotics.  #2 acute on chronic systolic congestive heart failure-possible diastolic dysfunction, echo showed moderately reduced overall left ventricular function ejection fraction between 30 and 35% -Continue Lasix on discharge  #3 chronic atrial fibrillation-not taking any medications at home.  And continues to refuse taking any medications On discharge.  He is aware of risk for not taking blood thinners.  He reports he has had atrial fibrillations since he was child and nothing is happening rather not take anything at this point.  #4 Acute on chronic kidney disease stage II:   He is feeling much better, still has some lower extremity edema that has improved.  He is agreeable with the discharge plan and is being discharged  home in stable condition.  CONSULTS OBTAINED:  Treatment Team:  Yolonda Kida, MD Adrian Prows, MD  DRUG ALLERGIES:   Allergies  Allergen Reactions  . Penicillins Nausea And Vomiting   DISCHARGE MEDICATIONS:   Current Discharge Medication List    START taking these medications   Details  ciprofloxacin (CIPRO) 250 MG tablet Take 1 tablet (250 mg total) by mouth 2 (two) times daily. Qty: 20 tablet, Refills: 0    furosemide (LASIX) 20 MG tablet Take 1 tablet (20 mg total) by mouth daily. Qty: 30 tablet, Refills: 0    sulfamethoxazole-trimethoprim (BACTRIM DS) 800-160 MG per tablet Take 1 tablet by mouth 2 (two) times daily. Qty: 20 tablet, Refills: 0      STOP taking these medications     aspirin EC 81 MG tablet        DIET:  Cardiac diet  DISCHARGE CONDITION:  Good  ACTIVITY:  Activity as tolerated  OXYGEN:  Home Oxygen: No.   Oxygen Delivery: room air  DISCHARGE LOCATION:  home   If you experience worsening of your admission symptoms, develop shortness of breath, life threatening emergency, suicidal or homicidal thoughts you must seek medical attention immediately by calling 911 or calling your MD immediately  if symptoms less severe.  You Must read complete instructions/literature along with all the possible adverse reactions/side effects for all the Medicines you take and that have been prescribed to you. Take any new Medicines after you have completely understood and accpet all the possible adverse reactions/side effects.   Please note  You were cared for by a hospitalist during your hospital stay. If you have any questions about your discharge  medications or the care you received while you were in the hospital after you are discharged, you can call the unit and asked to speak with the hospitalist on call if the hospitalist that took care of you is not available. Once you are discharged, your primary care physician will handle any further medical  issues. Please note that NO REFILLS for any discharge medications will be authorized once you are discharged, as it is imperative that you return to your primary care physician (or establish a relationship with a primary care physician if you do not have one) for your aftercare needs so that they can reassess your need for medications and monitor your lab values.   On the day of Discharge:   VITAL SIGNS:  Blood pressure 105/62, pulse 94, temperature 97.5 F (36.4 C), temperature source Oral, resp. rate 16, height 5\' 9"  (1.753 m), weight 103.602 kg (228 lb 6.4 oz), SpO2 99 %.  I/O:   Intake/Output Summary (Last 24 hours) at 04/11/15 1130 Last data filed at 04/11/15 0800  Gross per 24 hour  Intake    720 ml  Output    403 ml  Net    317 ml    PHYSICAL EXAMINATION:  Constitutional: He is oriented to person, place, and time and well-developed, well-nourished, and in no distress. Vital signs are normal.  HENT:  Head: Normocephalic and atraumatic.  Eyes: Conjunctivae and EOM are normal. Pupils are equal, round, and reactive to light.  Neck: Normal range of motion. Neck supple. No tracheal deviation present. No thyromegaly present.  Cardiovascular: Normal rate, regular rhythm and normal heart sounds.  Pulmonary/Chest: Effort normal and breath sounds normal. No respiratory distress. He has no wheezes. He exhibits no tenderness.  Abdominal: Soft. Bowel sounds are normal. He exhibits no distension. There is no tenderness.  Musculoskeletal: Normal range of motion.   Right shoulder: He exhibits no swelling.   Right ankle: He exhibits swelling (2+).   Left ankle: He exhibits swelling (2+ bilaterally).  Neurological: He is alert and oriented to person, place, and time. No cranial nerve deficit.  Skin: Skin is warm and dry. No rash noted.  Wound care nurse reports:  Left anterior calf 8 cm x 5 cm x 0.2 cm Left posterior calf 6 cm x 6 cm x 0.2 cm Right anterior calf 5.5 cm x 3 x  0.2 cm Right posterior calf 6 cm x 4 cm x 0.2 cm  Wound bed: all are ruddy red Drainage (amount, consistency, odor) Heavy serous weeping.  When I evaluated patient's both calfs are in dressing and has been soaked with serous discharge.  Psychiatric: Mood and affect normal.   DATA REVIEW:   CBC  Recent Labs Lab 04/11/15 0600  WBC 2.8*  HGB 15.2  HCT 46.0  PLT 110*   Chemistries   Recent Labs Lab 04/07/15 1641  04/11/15 0600  NA 135  < > 139  K 4.3  < > 3.2*  CL 102  < > 94*  CO2 22  < > 35*  GLUCOSE 136*  < > 87  BUN 22*  < > 26*  CREATININE 1.37*  < > 1.65*  CALCIUM 8.8*  < > 8.5*  AST 18  --   --   ALT 13*  --   --   ALKPHOS 94  --   --   BILITOT 3.3*  --   --   < > = values in this interval not displayed.  Microbiology Results : Blood  cultures remain negative and bone culture grew Pseudomonas, Proteus and Staphylococcus aureus.  Management plans discussed with the patient and he is in agreement.  CODE STATUS: Full code  TOTAL TIME TAKING CARE OF THIS PATIENT: 55 minutes.   [facility], Srishti Strnad M.D on 04/11/2015 at 11:30 AM  Between 7am to 6pm - Pager - [phone]  After 6pm go to [email or web address] - password [email or web address] [place] [facility]  Office  [phone]  CC: Primary care physician; [facility] Katha Cabal, MD Adrian Prows, MD

## 2015-04-11 NOTE — Plan of Care (Signed)
Problem: Discharge Progression Outcomes Goal: Other Discharge Outcomes/Goals Afebrile. Dressings to Lower extremities dry and intact. Reenforced education about CHF, pt verbalized understanding. Incontinent, refused to be bathed. Gown changed.

## 2015-04-12 LAB — CULTURE, BLOOD (ROUTINE X 2)
Culture: NO GROWTH
Culture: NO GROWTH

## 2015-04-13 LAB — ANAEROBIC CULTURE

## 2015-04-13 LAB — WOUND CULTURE

## 2015-05-11 ENCOUNTER — Encounter: Payer: Self-pay | Admitting: Family

## 2015-05-11 ENCOUNTER — Ambulatory Visit: Payer: Medicaid Other | Attending: Family | Admitting: Family

## 2015-05-11 VITALS — BP 130/90 | HR 124 | Resp 20 | Ht 68.0 in | Wt 263.0 lb

## 2015-05-11 DIAGNOSIS — I10 Essential (primary) hypertension: Secondary | ICD-10-CM

## 2015-05-11 DIAGNOSIS — I5020 Unspecified systolic (congestive) heart failure: Secondary | ICD-10-CM | POA: Insufficient documentation

## 2015-05-11 DIAGNOSIS — Z79899 Other long term (current) drug therapy: Secondary | ICD-10-CM | POA: Insufficient documentation

## 2015-05-11 DIAGNOSIS — I5022 Chronic systolic (congestive) heart failure: Secondary | ICD-10-CM

## 2015-05-11 DIAGNOSIS — L03115 Cellulitis of right lower limb: Secondary | ICD-10-CM | POA: Diagnosis not present

## 2015-05-11 DIAGNOSIS — I4891 Unspecified atrial fibrillation: Secondary | ICD-10-CM | POA: Insufficient documentation

## 2015-05-11 DIAGNOSIS — L03116 Cellulitis of left lower limb: Secondary | ICD-10-CM | POA: Diagnosis not present

## 2015-05-11 DIAGNOSIS — I5023 Acute on chronic systolic (congestive) heart failure: Secondary | ICD-10-CM | POA: Insufficient documentation

## 2015-05-11 DIAGNOSIS — Z7982 Long term (current) use of aspirin: Secondary | ICD-10-CM | POA: Diagnosis not present

## 2015-05-11 DIAGNOSIS — I482 Chronic atrial fibrillation, unspecified: Secondary | ICD-10-CM

## 2015-05-11 NOTE — Patient Instructions (Addendum)
Weigh daily and call for an overnight weight gain of >2 pounds or a weekly weight gain of >5 pounds.   Take 1/2 tablet of furosemide (lasix) every day.    Low-Sodium Eating Plan Sodium raises blood pressure and causes water to be held in the body. Getting less sodium from food will help lower your blood pressure, reduce any swelling, and protect your heart, liver, and kidneys. We get sodium by adding salt (sodium chloride) to food. Most of our sodium comes from canned, boxed, and frozen foods. Restaurant foods, fast foods, and pizza are also very high in sodium. Even if you take medicine to lower your blood pressure or to reduce fluid in your body, getting less sodium from your food is important. WHAT IS MY PLAN? Most people should limit their sodium intake to 2,300 mg a day. Your health care provider recommends that you limit your sodium intake to _2000mg _ a day.  WHAT DO I NEED TO KNOW ABOUT THIS EATING PLAN? For the low-sodium eating plan, you will follow these general guidelines:  Choose foods with a % Daily Value for sodium of less than 5% (as listed on the food label).   Use salt-free seasonings or herbs instead of table salt or sea salt.   Check with your health care provider or pharmacist before using salt substitutes.   Eat fresh foods.  Eat more vegetables and fruits.  Limit canned vegetables. If you do use them, rinse them well to decrease the sodium.   Limit cheese to 1 oz (28 g) per day.   Eat lower-sodium products, often labeled as "lower sodium" or "no salt added."  Avoid foods that contain monosodium glutamate (MSG). MSG is sometimes added to [place] food and some canned foods.  Check food labels (Nutrition Facts labels) on foods to learn how much sodium is in one serving.  Eat more home-cooked food and less restaurant, buffet, and fast food.  When eating at a restaurant, ask that your food be prepared with less salt or none, if possible.  HOW DO I READ  FOOD LABELS FOR SODIUM INFORMATION? The Nutrition Facts label lists the amount of sodium in one serving of the food. If you eat more than one serving, you must multiply the listed amount of sodium by the number of servings. Food labels may also identify foods as:  Sodium free--Less than 5 mg in a serving.  Very low sodium--35 mg or less in a serving.  Low sodium--140 mg or less in a serving.  Light in sodium--50% less sodium in a serving. For example, if a food that usually has 300 mg of sodium is changed to become light in sodium, it will have 150 mg of sodium.  Reduced sodium--25% less sodium in a serving. For example, if a food that usually has 400 mg of sodium is changed to reduced sodium, it will have 300 mg of sodium. WHAT FOODS CAN I EAT? Grains Low-sodium cereals, including oats, puffed wheat and rice, and shredded wheat cereals. Low-sodium crackers. Unsalted rice and pasta. Lower-sodium bread.  Vegetables Frozen or fresh vegetables. Low-sodium or reduced-sodium canned vegetables. Low-sodium or reduced-sodium tomato sauce and paste. Low-sodium or reduced-sodium tomato and vegetable juices.  Fruits Fresh, frozen, and canned fruit. Fruit juice.  Meat and Other Protein Products Low-sodium canned tuna and salmon. Fresh or frozen meat, poultry, seafood, and fish. Lamb. Unsalted nuts. Dried beans, peas, and lentils without added salt. Unsalted canned beans. Homemade soups without salt. Eggs.  Dairy Milk. Soy milk.  Ricotta cheese. Low-sodium or reduced-sodium cheeses. Yogurt.  Condiments Fresh and dried herbs and spices. Salt-free seasonings. Onion and garlic powders. Low-sodium varieties of mustard and ketchup. Lemon juice.  Fats and Oils Reduced-sodium salad dressings. Unsalted butter.  Other Unsalted popcorn and pretzels.  The items listed above may not be a complete list of recommended foods or beverages. Contact your dietitian for more options. WHAT FOODS ARE NOT  RECOMMENDED? Grains Instant hot cereals. Bread stuffing, pancake, and biscuit mixes. Croutons. Seasoned rice or pasta mixes. Noodle soup cups. Boxed or frozen macaroni and cheese. Self-rising flour. Regular salted crackers. Vegetables Regular canned vegetables. Regular canned tomato sauce and paste. Regular tomato and vegetable juices. Frozen vegetables in sauces. Salted french fries. Olives. Angie Fava. Relishes. Sauerkraut. Salsa. Meat and Other Protein Products Salted, canned, smoked, spiced, or pickled meats, seafood, or fish. Bacon, ham, sausage, hot dogs, corned beef, chipped beef, and packaged luncheon meats. Salt pork. Jerky. Pickled herring. Anchovies, regular canned tuna, and sardines. Salted nuts. Dairy Processed cheese and cheese spreads. Cheese curds. Blue cheese and cottage cheese. Buttermilk.  Condiments Onion and garlic salt, seasoned salt, table salt, and sea salt. Canned and packaged gravies. Worcestershire sauce. Tartar sauce. Barbecue sauce. Teriyaki sauce. Soy sauce, including reduced sodium. Steak sauce. Fish sauce. Oyster sauce. Cocktail sauce. Horseradish. Regular ketchup and mustard. Meat flavorings and tenderizers. Bouillon cubes. Hot sauce. Tabasco sauce. Marinades. Taco seasonings. Relishes. Fats and Oils Regular salad dressings. Salted butter. Margarine. Ghee. Bacon fat.  Other Potato and tortilla chips. Corn chips and puffs. Salted popcorn and pretzels. Canned or dried soups. Pizza. Frozen entrees and pot pies.  The items listed above may not be a complete list of foods and beverages to avoid. Contact your dietitian for more information. Document Released: 04/04/2002 Document Revised: 10/18/2013 Document Reviewed: 08/17/2013 [facility] Patient Information 2015 [place], [place]. This information is not intended to replace advice given to you by your health care provider. Make sure you discuss any questions you have with your health care provider.

## 2015-05-11 NOTE — Progress Notes (Signed)
Subjective:    Patient ID: Aaron Harrison, male    DOB: Mar 07, 1951, 64 y.o.   MRN: [id number]  Congestive Heart Failure Presents for initial visit. The disease course has been stable. Associated symptoms include edema, fatigue and shortness of breath. Pertinent negatives include no abdominal pain, chest pain, chest pressure, orthopnea, palpitations or paroxysmal nocturnal dyspnea. The symptoms have been stable. Past treatments include salt and fluid restriction. The treatment provided mild relief. His past medical history is significant for arrhythmia (atrial fibrillation) and HTN. There is no history of DM.  Hypertension This is a chronic problem. The problem is unchanged. The problem is uncontrolled. Associated symptoms include malaise/fatigue, peripheral edema and shortness of breath. Pertinent negatives include no chest pain, headaches or palpitations. There are no associated agents to hypertension. Risk factors for coronary artery disease include male gender and obesity. Hypertensive end-organ damage includes heart failure.     Past Medical History  Diagnosis Date  . Dysrhythmia   . Pneumonia 2011  . CHF (congestive heart failure)   . Pneumonia 2011  . Cellulitis    No past surgical history on file.   History  Substance Use Topics  . Smoking status: Never Smoker   . Smokeless tobacco: Never Used  . Alcohol Use: No    Allergies  Allergen Reactions  . Penicillins Nausea And Vomiting    Prior to Admission medications   Medication Sig Start Date End Date Taking? Authorizing Provider  aspirin EC 81 MG tablet Take 81 mg by mouth daily.   Yes Historical Provider, MD  furosemide (LASIX) 20 MG tablet Take 1 tablet (20 mg total) by mouth daily. Patient not taking: Reported on 05/11/2015 04/11/15   Max Sane, MD    Review of Systems  Constitutional: Positive for malaise/fatigue and fatigue. Negative for appetite change.  HENT: Negative for congestion, rhinorrhea and sore throat.    Eyes: Negative.   Respiratory: Positive for shortness of breath. Negative for cough and chest tightness.   Cardiovascular: Positive for leg swelling. Negative for chest pain and palpitations.  Gastrointestinal: Negative for abdominal pain and abdominal distention.  Endocrine: Negative.   Genitourinary: Negative.   Musculoskeletal: Negative for back pain and arthralgias.  Skin: Positive for wound. Negative for color change.  Allergic/Immunologic: Negative.   Neurological: Positive for dizziness (when turning the head when lying down). Negative for light-headedness and headaches.  Hematological: Negative for adenopathy. Bruises/bleeds easily.  Psychiatric/Behavioral: Negative for sleep disturbance (sleeps on 2 pillows) and dysphoric mood.       Objective:   Physical Exam  Constitutional: He is oriented to person, place, and time. He appears well-developed and well-nourished.  HENT:  Head: Normocephalic and atraumatic.  Eyes: Conjunctivae are normal. Pupils are equal, round, and reactive to light.  Neck: Normal range of motion. Neck supple.  Cardiovascular: An irregularly irregular rhythm present. Tachycardia present.   Pulmonary/Chest: Effort normal. He has no wheezes. He has no rales.  Abdominal: Soft. He exhibits distension. There is no tenderness.  Musculoskeletal: He exhibits edema (bilateral lower legs ). He exhibits no tenderness.  Neurological: He is alert and oriented to person, place, and time.  Skin: Skin is warm and dry. Rash (bilateral lower legs wrapped ) noted.  Psychiatric: He has a normal mood and affect. His behavior is normal.  Nursing note and vitals reviewed.   BP 130/90 mmHg  Pulse 124  Resp 20  Ht 5\' 8"  (1.727 m)  Wt 263 lb (119.296 kg)  BMI 40.00 kg/m2  SpO2 96%       Assessment & Plan:  1: Chronic heart failure with reduced ejection fraction- Patient presents with shortness of breath, fatigue and edema. He says that he's noticed his abdomen being  tighter since hospital discharge. He also feels like his weight has gone up because his pants are a little bit tighter. He says that he hasn't been taking his furosemide because it makes him urinate too much and is only taking his aspirin. He hasn't been weighing daily and he was instructed to begin weighing daily and to call for an overnight weight gain of >2 pounds or a weekly weight gain of >5 pounds. His last hospital weight was 228 lbs on 04/11/15 and by our scale today, he is now 263 lbs. Discussed the importance of taking his furosemide daily and how the extra fluid can be putting strain on the heart. He is agreeable to trying to take 1/2 tablet (10mg ) of his furosemide daily. He does not add salt to his food and tries to look at food labels. Reviewed how to read food labels and the importance of following a 2000mg  sodium diet. 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  2: Atrial fibrillation- Patient's heart rate is in the 120's and very irregular. He says that he's had a fast and irregular heart rate "all my life and have done fine with it". Discussed adding low dose carvedilol and also discussed how an extremely fast heart rate puts a lot of stress on the heart. Patient is very leary about starting any medication because of a bad experience he had with another provider giving him a medication that made him worse. He says that he will think about it and will discuss this with him further at his next office visit. Patient only taking aspirin and is not interested in any other anticoagulation.  3: HTN- Blood pressure slightly elevated even on recheck. Hopefully adding even a low dose of furosemide will help decrease his blood pressure. 4: Cellulitis- Patient has both lower legs wrapped for some nonhealing wounds related to cellulitis. He was getting the bandages changed weekly but the order has been finished. Sees the vascular provider (Dr. Delana Meyer) on  05/14/15. Strong odor noted from legs in the exam room. Patient has recently finished his antibiotics.  Currently does not have a PCP but has the paperwork to fill out for [facility]. Encouraged him to fill that out and get that sent in so that he has a PCP.   Return in 1 month or sooner for any questions/problems before then.

## 2015-06-10 ENCOUNTER — Emergency Department: Payer: Medicaid Other

## 2015-06-10 ENCOUNTER — Inpatient Hospital Stay
Admission: EM | Admit: 2015-06-10 | Discharge: 2015-07-12 | DRG: 207 | Disposition: A | Discharge status: Home / Self Care | Payer: Medicaid Other | Attending: Internal Medicine | Admitting: Internal Medicine

## 2015-06-10 ENCOUNTER — Inpatient Hospital Stay: Payer: Medicaid Other

## 2015-06-10 ENCOUNTER — Encounter: Payer: Self-pay | Admitting: Emergency Medicine

## 2015-06-10 DIAGNOSIS — J15211 Pneumonia due to Methicillin susceptible Staphylococcus aureus: Secondary | ICD-10-CM | POA: Diagnosis present

## 2015-06-10 DIAGNOSIS — Z9911 Dependence on respirator [ventilator] status: Secondary | ICD-10-CM | POA: Diagnosis not present

## 2015-06-10 DIAGNOSIS — N2581 Secondary hyperparathyroidism of renal origin: Secondary | ICD-10-CM | POA: Diagnosis present

## 2015-06-10 DIAGNOSIS — I951 Orthostatic hypotension: Secondary | ICD-10-CM | POA: Diagnosis present

## 2015-06-10 DIAGNOSIS — A419 Sepsis, unspecified organism: Secondary | ICD-10-CM | POA: Diagnosis not present

## 2015-06-10 DIAGNOSIS — I4891 Unspecified atrial fibrillation: Secondary | ICD-10-CM | POA: Diagnosis not present

## 2015-06-10 DIAGNOSIS — G9341 Metabolic encephalopathy: Secondary | ICD-10-CM | POA: Diagnosis not present

## 2015-06-10 DIAGNOSIS — R600 Localized edema: Secondary | ICD-10-CM

## 2015-06-10 DIAGNOSIS — Z66 Do not resuscitate: Secondary | ICD-10-CM | POA: Diagnosis present

## 2015-06-10 DIAGNOSIS — R188 Other ascites: Secondary | ICD-10-CM | POA: Diagnosis not present

## 2015-06-10 DIAGNOSIS — E1122 Type 2 diabetes mellitus with diabetic chronic kidney disease: Secondary | ICD-10-CM | POA: Diagnosis present

## 2015-06-10 DIAGNOSIS — R1110 Vomiting, unspecified: Secondary | ICD-10-CM

## 2015-06-10 DIAGNOSIS — J9600 Acute respiratory failure, unspecified whether with hypoxia or hypercapnia: Secondary | ICD-10-CM | POA: Diagnosis not present

## 2015-06-10 DIAGNOSIS — Z7901 Long term (current) use of anticoagulants: Secondary | ICD-10-CM | POA: Diagnosis not present

## 2015-06-10 DIAGNOSIS — I5023 Acute on chronic systolic (congestive) heart failure: Secondary | ICD-10-CM | POA: Diagnosis present

## 2015-06-10 DIAGNOSIS — I482 Chronic atrial fibrillation, unspecified: Secondary | ICD-10-CM

## 2015-06-10 DIAGNOSIS — N179 Acute kidney failure, unspecified: Secondary | ICD-10-CM | POA: Diagnosis present

## 2015-06-10 DIAGNOSIS — Z978 Presence of other specified devices: Secondary | ICD-10-CM | POA: Insufficient documentation

## 2015-06-10 DIAGNOSIS — L97209 Non-pressure chronic ulcer of unspecified calf with unspecified severity: Secondary | ICD-10-CM | POA: Diagnosis present

## 2015-06-10 DIAGNOSIS — Y95 Nosocomial condition: Secondary | ICD-10-CM | POA: Diagnosis present

## 2015-06-10 DIAGNOSIS — I959 Hypotension, unspecified: Secondary | ICD-10-CM | POA: Diagnosis present

## 2015-06-10 DIAGNOSIS — E669 Obesity, unspecified: Secondary | ICD-10-CM | POA: Diagnosis present

## 2015-06-10 DIAGNOSIS — R778 Other specified abnormalities of plasma proteins: Secondary | ICD-10-CM

## 2015-06-10 DIAGNOSIS — Z515 Encounter for palliative care: Secondary | ICD-10-CM | POA: Diagnosis not present

## 2015-06-10 DIAGNOSIS — I429 Cardiomyopathy, unspecified: Secondary | ICD-10-CM | POA: Diagnosis present

## 2015-06-10 DIAGNOSIS — D631 Anemia in chronic kidney disease: Secondary | ICD-10-CM | POA: Diagnosis present

## 2015-06-10 DIAGNOSIS — I872 Venous insufficiency (chronic) (peripheral): Secondary | ICD-10-CM | POA: Diagnosis present

## 2015-06-10 DIAGNOSIS — Z880 Allergy status to penicillin: Secondary | ICD-10-CM

## 2015-06-10 DIAGNOSIS — L03116 Cellulitis of left lower limb: Secondary | ICD-10-CM | POA: Diagnosis present

## 2015-06-10 DIAGNOSIS — K769 Liver disease, unspecified: Secondary | ICD-10-CM | POA: Insufficient documentation

## 2015-06-10 DIAGNOSIS — Z8701 Personal history of pneumonia (recurrent): Secondary | ICD-10-CM

## 2015-06-10 DIAGNOSIS — Z9119 Patient's noncompliance with other medical treatment and regimen: Secondary | ICD-10-CM | POA: Diagnosis present

## 2015-06-10 DIAGNOSIS — I132 Hypertensive heart and chronic kidney disease with heart failure and with stage 5 chronic kidney disease, or end stage renal disease: Secondary | ICD-10-CM | POA: Diagnosis present

## 2015-06-10 DIAGNOSIS — I5021 Acute systolic (congestive) heart failure: Secondary | ICD-10-CM | POA: Diagnosis not present

## 2015-06-10 DIAGNOSIS — J189 Pneumonia, unspecified organism: Secondary | ICD-10-CM | POA: Insufficient documentation

## 2015-06-10 DIAGNOSIS — D689 Coagulation defect, unspecified: Secondary | ICD-10-CM | POA: Diagnosis present

## 2015-06-10 DIAGNOSIS — Z4659 Encounter for fitting and adjustment of other gastrointestinal appliance and device: Secondary | ICD-10-CM

## 2015-06-10 DIAGNOSIS — Z6837 Body mass index (BMI) 37.0-37.9, adult: Secondary | ICD-10-CM

## 2015-06-10 DIAGNOSIS — G3184 Mild cognitive impairment, so stated: Secondary | ICD-10-CM | POA: Diagnosis present

## 2015-06-10 DIAGNOSIS — I4729 Other ventricular tachycardia: Secondary | ICD-10-CM | POA: Insufficient documentation

## 2015-06-10 DIAGNOSIS — L03115 Cellulitis of right lower limb: Secondary | ICD-10-CM | POA: Diagnosis not present

## 2015-06-10 DIAGNOSIS — R339 Retention of urine, unspecified: Secondary | ICD-10-CM | POA: Diagnosis present

## 2015-06-10 DIAGNOSIS — R0600 Dyspnea, unspecified: Secondary | ICD-10-CM

## 2015-06-10 DIAGNOSIS — IMO0001 Reserved for inherently not codable concepts without codable children: Secondary | ICD-10-CM | POA: Insufficient documentation

## 2015-06-10 DIAGNOSIS — N170 Acute kidney failure with tubular necrosis: Secondary | ICD-10-CM | POA: Diagnosis present

## 2015-06-10 DIAGNOSIS — R7989 Other specified abnormal findings of blood chemistry: Secondary | ICD-10-CM

## 2015-06-10 DIAGNOSIS — J15212 Pneumonia due to Methicillin resistant Staphylococcus aureus: Secondary | ICD-10-CM | POA: Diagnosis present

## 2015-06-10 DIAGNOSIS — E11622 Type 2 diabetes mellitus with other skin ulcer: Secondary | ICD-10-CM | POA: Diagnosis present

## 2015-06-10 DIAGNOSIS — A4101 Sepsis due to Methicillin susceptible Staphylococcus aureus: Secondary | ICD-10-CM | POA: Diagnosis present

## 2015-06-10 DIAGNOSIS — J9690 Respiratory failure, unspecified, unspecified whether with hypoxia or hypercapnia: Secondary | ICD-10-CM

## 2015-06-10 DIAGNOSIS — IMO0002 Reserved for concepts with insufficient information to code with codable children: Secondary | ICD-10-CM | POA: Insufficient documentation

## 2015-06-10 DIAGNOSIS — Z823 Family history of stroke: Secondary | ICD-10-CM

## 2015-06-10 DIAGNOSIS — J9601 Acute respiratory failure with hypoxia: Principal | ICD-10-CM | POA: Diagnosis present

## 2015-06-10 DIAGNOSIS — I481 Persistent atrial fibrillation: Secondary | ICD-10-CM | POA: Diagnosis not present

## 2015-06-10 DIAGNOSIS — Z452 Encounter for adjustment and management of vascular access device: Secondary | ICD-10-CM

## 2015-06-10 DIAGNOSIS — N186 End stage renal disease: Secondary | ICD-10-CM

## 2015-06-10 DIAGNOSIS — I509 Heart failure, unspecified: Secondary | ICD-10-CM | POA: Diagnosis not present

## 2015-06-10 DIAGNOSIS — G9340 Encephalopathy, unspecified: Secondary | ICD-10-CM | POA: Diagnosis present

## 2015-06-10 DIAGNOSIS — I480 Paroxysmal atrial fibrillation: Secondary | ICD-10-CM | POA: Diagnosis not present

## 2015-06-10 DIAGNOSIS — D696 Thrombocytopenia, unspecified: Secondary | ICD-10-CM | POA: Diagnosis present

## 2015-06-10 DIAGNOSIS — R6520 Severe sepsis without septic shock: Secondary | ICD-10-CM | POA: Diagnosis present

## 2015-06-10 DIAGNOSIS — I130 Hypertensive heart and chronic kidney disease with heart failure and stage 1 through stage 4 chronic kidney disease, or unspecified chronic kidney disease: Secondary | ICD-10-CM | POA: Diagnosis not present

## 2015-06-10 DIAGNOSIS — I472 Ventricular tachycardia: Secondary | ICD-10-CM | POA: Insufficient documentation

## 2015-06-10 DIAGNOSIS — N189 Chronic kidney disease, unspecified: Secondary | ICD-10-CM | POA: Diagnosis not present

## 2015-06-10 DIAGNOSIS — R140 Abdominal distension (gaseous): Secondary | ICD-10-CM

## 2015-06-10 DIAGNOSIS — Z01818 Encounter for other preprocedural examination: Secondary | ICD-10-CM

## 2015-06-10 DIAGNOSIS — K7290 Hepatic failure, unspecified without coma: Secondary | ICD-10-CM | POA: Diagnosis present

## 2015-06-10 DIAGNOSIS — E875 Hyperkalemia: Secondary | ICD-10-CM | POA: Diagnosis present

## 2015-06-10 DIAGNOSIS — R0602 Shortness of breath: Secondary | ICD-10-CM

## 2015-06-10 HISTORY — DX: Chronic systolic (congestive) heart failure: I50.22

## 2015-06-10 HISTORY — DX: Chronic atrial fibrillation, unspecified: I48.20

## 2015-06-10 LAB — BLOOD GAS, ARTERIAL
ACID-BASE DEFICIT: 5 mmol/L — AB (ref 0.0–2.0)
Acid-base deficit: 7.4 mmol/L — ABNORMAL HIGH (ref 0.0–2.0)
Allens test (pass/fail): POSITIVE — AB
BICARBONATE: 23 meq/L (ref 21.0–28.0)
BICARBONATE: 21.6 meq/L (ref 21.0–28.0)
FIO2: 0.6
FIO2: 0.6
MECHANICAL RATE: 15
MECHANICAL RATE: 16
O2 Saturation: 86.5 %
O2 Saturation: 99.5 %
PATIENT TEMPERATURE: 37
PCO2 ART: 69 mmHg — AB (ref 32.0–48.0)
PCO2 ART: 45 mmHg (ref 32.0–48.0)
PEEP/CPAP: 5 cmH2O
PH ART: 7.13 — AB (ref 7.350–7.450)
PH ART: 7.29 — AB (ref 7.350–7.450)
Patient temperature: 37
VT: 500 mL
pO2, Arterial: 69 mmHg — ABNORMAL LOW (ref 83.0–108.0)
pO2, Arterial: 185 mmHg — ABNORMAL HIGH (ref 83.0–108.0)

## 2015-06-10 LAB — COMPREHENSIVE METABOLIC PANEL
ALBUMIN: 3.5 g/dL (ref 3.5–5.0)
ALK PHOS: 87 U/L (ref 38–126)
ALT: 150 U/L — AB (ref 17–63)
AST: 301 U/L — AB (ref 15–41)
Anion gap: 9 (ref 5–15)
BUN: 48 mg/dL — ABNORMAL HIGH (ref 6–20)
CALCIUM: 8.7 mg/dL — AB (ref 8.9–10.3)
CHLORIDE: 103 mmol/L (ref 101–111)
CO2: 26 mmol/L (ref 22–32)
CREATININE: 2.46 mg/dL — AB (ref 0.61–1.24)
GFR calc non Af Amer: 26 mL/min — ABNORMAL LOW (ref >60)
GFR, EST AFRICAN AMERICAN: 31 mL/min — AB (ref >60)
Glucose, Bld: 112 mg/dL — ABNORMAL HIGH (ref 65–99)
POTASSIUM: 5.5 mmol/L — AB (ref 3.5–5.1)
Sodium: 138 mmol/L (ref 135–145)
Total Bilirubin: 6.7 mg/dL — ABNORMAL HIGH (ref 0.3–1.2)
Total Protein: 7.2 g/dL (ref 6.5–8.1)

## 2015-06-10 LAB — CBC WITH DIFFERENTIAL/PLATELET
BASOS PCT: 0 %
Basophils Absolute: 0 10*3/uL (ref 0–0.1)
EOS ABS: 0 10*3/uL (ref 0–0.7)
EOS PCT: 0 %
HEMATOCRIT: 42 % (ref 40.0–52.0)
Hemoglobin: 13.4 g/dL (ref 13.0–18.0)
LYMPHS ABS: 0.6 10*3/uL — AB (ref 1.0–3.6)
LYMPHS PCT: 6 %
MCH: 33.5 pg (ref 26.0–34.0)
MCHC: 31.9 g/dL — ABNORMAL LOW (ref 32.0–36.0)
MCV: 105.2 fL — AB (ref 80.0–100.0)
Monocytes Absolute: 0.6 10*3/uL (ref 0.2–1.0)
Monocytes Relative: 6 %
NEUTROS PCT: 88 %
Neutro Abs: 9.8 10*3/uL — ABNORMAL HIGH (ref 1.4–6.5)
Platelets: 155 10*3/uL (ref 150–440)
RBC: 3.99 MIL/uL — ABNORMAL LOW (ref 4.40–5.90)
RDW: 17 % — AB (ref 11.5–14.5)
WBC: 11.1 10*3/uL — AB (ref 3.8–10.6)

## 2015-06-10 LAB — URINALYSIS COMPLETE WITH MICROSCOPIC (ARMC ONLY)
Bilirubin Urine: NEGATIVE
GLUCOSE, UA: NEGATIVE mg/dL
Ketones, ur: NEGATIVE mg/dL
LEUKOCYTES UA: NEGATIVE
Nitrite: NEGATIVE
PH: 5 (ref 5.0–8.0)
PROTEIN: 100 mg/dL — AB
SPECIFIC GRAVITY, URINE: 1.02 (ref 1.005–1.030)

## 2015-06-10 LAB — LACTIC ACID, PLASMA
LACTIC ACID, VENOUS: 2.1 mmol/L — AB (ref 0.5–2.0)
LACTIC ACID, VENOUS: 4 mmol/L — AB (ref 0.5–2.0)

## 2015-06-10 LAB — LIPASE, BLOOD: Lipase: 20 U/L — ABNORMAL LOW (ref 22–51)

## 2015-06-10 LAB — TROPONIN I
TROPONIN I: 0.26 ng/mL — AB (ref <0.031)
Troponin I: 0.2 ng/mL — ABNORMAL HIGH (ref <0.031)

## 2015-06-10 LAB — APTT: aPTT: 32 seconds (ref 24–36)

## 2015-06-10 LAB — TRIGLYCERIDES: TRIGLYCERIDES: 68 mg/dL (ref <150)

## 2015-06-10 LAB — PROTIME-INR
INR: 2
PROTHROMBIN TIME: 22.8 s — AB (ref 11.4–15.0)

## 2015-06-10 LAB — AMMONIA: AMMONIA: 26 umol/L (ref 9–35)

## 2015-06-10 LAB — MRSA PCR SCREENING: MRSA by PCR: NEGATIVE

## 2015-06-10 MED ORDER — ONDANSETRON HCL 4 MG/2ML IJ SOLN
4.0000 mg | INTRAMUSCULAR | Status: AC
  Administered 2015-06-10: 4 mg via INTRAVENOUS
  Filled 2015-06-10: qty 2

## 2015-06-10 MED ORDER — SODIUM POLYSTYRENE SULFONATE 15 GM/60ML PO SUSP
30.0000 g | Freq: Once | ORAL | Status: AC
Start: 2015-06-10 — End: 2015-06-10
  Administered 2015-06-10: 30 g
  Filled 2015-06-10: qty 120

## 2015-06-10 MED ORDER — ANTISEPTIC ORAL RINSE SOLUTION (CORINZ)
7.0000 mL | Freq: Four times a day (QID) | OROMUCOSAL | Status: DC
  Administered 2015-06-11 – 2015-06-22 (×44): 7 mL via OROMUCOSAL
  Filled 2015-06-10 (×50): qty 7

## 2015-06-10 MED ORDER — ROCURONIUM BROMIDE 50 MG/5ML IV SOLN
INTRAVENOUS | Status: DC | PRN
  Administered 2015-06-10: 100 mg via INTRAVENOUS

## 2015-06-10 MED ORDER — DILTIAZEM HCL 25 MG/5ML IV SOLN
INTRAVENOUS | Status: AC
  Administered 2015-06-10: 15 mg
  Filled 2015-06-10: qty 5

## 2015-06-10 MED ORDER — PIPERACILLIN-TAZOBACTAM 4.5 G IVPB
4.5000 g | Freq: Three times a day (TID) | INTRAVENOUS | Status: DC
  Administered 2015-06-11 – 2015-06-12 (×5): 4.5 g via INTRAVENOUS
  Filled 2015-06-10 (×9): qty 100

## 2015-06-10 MED ORDER — PIPERACILLIN-TAZOBACTAM 3.375 G IVPB 30 MIN
3.3750 g | Freq: Once | INTRAVENOUS | Status: AC
  Administered 2015-06-10: 3.375 g via INTRAVENOUS
  Filled 2015-06-10: qty 50

## 2015-06-10 MED ORDER — CHLORHEXIDINE GLUCONATE 0.12% ORAL RINSE (MEDLINE KIT)
15.0000 mL | Freq: Two times a day (BID) | OROMUCOSAL | Status: DC
Start: 2015-06-10 — End: 2015-06-22
  Administered 2015-06-10 – 2015-06-22 (×22): 15 mL via OROMUCOSAL
  Filled 2015-06-10 (×27): qty 15

## 2015-06-10 MED ORDER — PROPOFOL 1000 MG/100ML IV EMUL
5.0000 ug/kg/min | Freq: Once | INTRAVENOUS | Status: AC
  Administered 2015-06-10: 11.99 ug/kg/min via INTRAVENOUS

## 2015-06-10 MED ORDER — PIPERACILLIN SOD-TAZOBACTAM SO 3.375 (3-0.375) G IV SOLR
INTRAVENOUS | Status: AC
  Filled 2015-06-10: qty 3.38

## 2015-06-10 MED ORDER — VANCOMYCIN HCL 10 G IV SOLR
1500.0000 mg | INTRAVENOUS | Status: DC
  Filled 2015-06-10 (×2): qty 1500

## 2015-06-10 MED ORDER — SODIUM CHLORIDE 0.9 % IV BOLUS (SEPSIS)
1000.0000 mL | INTRAVENOUS | Status: AC
  Administered 2015-06-10: 1000 mL via INTRAVENOUS

## 2015-06-10 MED ORDER — PROPOFOL 1000 MG/100ML IV EMUL
5.0000 ug/kg/min | INTRAVENOUS | Status: DC
  Administered 2015-06-10: 29.31 ug/kg/min via INTRAVENOUS
  Administered 2015-06-11: 30 ug/kg/min via INTRAVENOUS
  Filled 2015-06-10 (×2): qty 100

## 2015-06-10 MED ORDER — PROPOFOL 1000 MG/100ML IV EMUL
INTRAVENOUS | Status: AC
  Administered 2015-06-10: 29.31 ug/kg/min via INTRAVENOUS
  Filled 2015-06-10: qty 100

## 2015-06-10 MED ORDER — VANCOMYCIN HCL IN DEXTROSE 1-5 GM/200ML-% IV SOLN
1000.0000 mg | Freq: Once | INTRAVENOUS | Status: AC
  Administered 2015-06-10: 1000 mg via INTRAVENOUS
  Filled 2015-06-10: qty 200

## 2015-06-10 MED ORDER — SODIUM CHLORIDE 0.9 % IV SOLN
INTRAVENOUS | Status: DC
Start: 2015-06-10 — End: 2015-06-13
  Administered 2015-06-10: 21:00:00 via INTRAVENOUS
  Administered 2015-06-11: 75 mL/h via INTRAVENOUS
  Administered 2015-06-13: 04:00:00 via INTRAVENOUS

## 2015-06-10 MED ORDER — FUROSEMIDE 10 MG/ML IJ SOLN
40.0000 mg | Freq: Three times a day (TID) | INTRAMUSCULAR | Status: DC
  Administered 2015-06-10 – 2015-06-11 (×3): 40 mg via INTRAVENOUS
  Filled 2015-06-10 (×2): qty 4

## 2015-06-10 MED ORDER — DILTIAZEM HCL 100 MG IV SOLR
5.0000 mg/h | Freq: Once | INTRAVENOUS | Status: AC
  Administered 2015-06-10: 5 mg/h via INTRAVENOUS
  Filled 2015-06-10: qty 100

## 2015-06-10 MED ORDER — SODIUM CHLORIDE 0.9 % IV BOLUS (SEPSIS)
1000.0000 mL | INTRAVENOUS | Status: AC
  Administered 2015-06-11: 1000 mL via INTRAVENOUS

## 2015-06-10 MED ORDER — LEVOFLOXACIN IN D5W 750 MG/150ML IV SOLN: 750.0000 mg | INTRAVENOUS | Status: DC

## 2015-06-10 MED ORDER — LEVOFLOXACIN IN D5W 750 MG/150ML IV SOLN
750.0000 mg | INTRAVENOUS | Status: DC
  Administered 2015-06-10: 750 mg via INTRAVENOUS
  Filled 2015-06-10: qty 150

## 2015-06-10 MED ORDER — HEPARIN SODIUM (PORCINE) 5000 UNIT/ML IJ SOLN
5000.0000 [IU] | Freq: Three times a day (TID) | INTRAMUSCULAR | Status: DC
  Administered 2015-06-10 – 2015-06-11 (×3): 5000 [IU] via SUBCUTANEOUS
  Filled 2015-06-10 (×2): qty 1

## 2015-06-10 MED ORDER — MAGNESIUM SULFATE 2 GM/50ML IV SOLN
2.0000 g | Freq: Once | INTRAVENOUS | Status: AC
  Administered 2015-06-10: 2 g via INTRAVENOUS
  Filled 2015-06-10: qty 50

## 2015-06-10 MED ORDER — PROPOFOL 1000 MG/100ML IV EMUL
INTRAVENOUS | Status: AC
  Filled 2015-06-10: qty 100

## 2015-06-10 MED ORDER — SODIUM CHLORIDE 0.9 % IV SOLN
1.0000 g | Freq: Once | INTRAVENOUS | Status: AC
  Administered 2015-06-10: 1 g via INTRAVENOUS
  Filled 2015-06-10: qty 10

## 2015-06-10 MED ORDER — ASPIRIN 81 MG PO CHEW
81.0000 mg | CHEWABLE_TABLET | Freq: Every day | ORAL | Status: DC
  Administered 2015-06-10 – 2015-06-13 (×4): 81 mg
  Filled 2015-06-10 (×4): qty 1

## 2015-06-10 NOTE — H&P (Signed)
[facility] [place] NAME: Aaron Harrison    MR#:  [id number]  DATE OF BIRTH:  Sep 11, 1951  DATE OF ADMISSION:  06/10/2015  PRIMARY CARE PHYSICIAN: No PCP Per Patient   REQUESTING/REFERRING PHYSICIAN: Hinda Kehr, MD  CHIEF COMPLAINT:   Chief Complaint  Patient presents with  . Weakness    "not acting right" per family    HISTORY OF PRESENT ILLNESS:  Aaron Harrison  is a 64 y.o. male with a known history of A. fib, CHF, pneumonia, cellulitis. The patient was sent to ED due to weakness. He was found to have a respiratory failure and was intubated in the ED. According to Dr. Karma Greaser, ED physician, the patient appeared toxic upon arrival but had no complaints. The patient told ED physician that he takes lactulose and also mentions that he has had some kidney disease but that is mild. The patient was found to have a heart rate 140s to 180s in A. Fib. His O2 saturation was low at 70s. he began to decompensate relatively rapidly with decreased mental status, diaphoresis, and elevated heart rate to the 180s to 200s. The patient's friends and his attorney were present, but they were unaware of any DO NOT RESUSCITATE orders or CODE STATUS recommendations, and the patient has no family or power of attorney. The patient became completely unresponsive and was intubated in ED. Chest x-ray showed pneumonia and pulmonary edema. Lab show acute renal failure, elevated troponin and INR.  PAST MEDICAL HISTORY:   Past Medical History  Diagnosis Date  . Dysrhythmia   . Pneumonia 2011  . CHF (congestive heart failure)   . Pneumonia 2011  . Cellulitis     PAST SURGICAL HISTORY:  History reviewed. No pertinent past surgical history.  SOCIAL HISTORY:   Social History  Substance Use Topics  . Smoking status: Never Smoker   . Smokeless tobacco: Never Used  . Alcohol Use: No    FAMILY HISTORY:   Family History  Problem Relation Age of Onset  .  Diabetes Mother   . Stroke Mother   . Heart failure Father     DRUG ALLERGIES:   Allergies  Allergen Reactions  . Penicillins Nausea And Vomiting    REVIEW OF SYSTEMS:  Intubated unable to obtain.  MEDICATIONS AT HOME:   Prior to Admission medications   Medication Sig Start Date End Date Taking? Authorizing Provider  aspirin EC 81 MG tablet Take 81 mg by mouth daily.    Historical Provider, MD  furosemide (LASIX) 20 MG tablet Take 1 tablet (20 mg total) by mouth daily. Patient not taking: Reported on 05/11/2015 04/11/15   Max Sane, MD      VITAL SIGNS:  Blood pressure 136/87, pulse 52, temperature 97.4 F (36.3 C), temperature source Oral, resp. rate 15, height 5\' 8"  (1.727 m), weight 125.147 kg (275 lb 14.4 oz), SpO2 88 %.  PHYSICAL EXAMINATION:  GENERAL:  64 y.o.-year-old patient lying in the bed, on ventilation. Critical ill-looking and obese. EYES: Pupils are pinpoint without reaction to light. No scleral icterus.  HEENT: Head atraumatic, normocephalic.  NECK:  Supple, no jugular venous distention. No thyroid enlargement. LUNGS: Bilateral crackles and rales but no wheezing. CARDIOVASCULAR: S1, S2 irregular rate and rhythm, tachycardia. No murmurs, rubs, or gallops.  ABDOMEN: Soft, highly distended with positive ascites signs. Bowel sounds present but weak. No organomegaly or mass.  EXTREMITIES: Bilateral lower extremity edema 2+ with erythema and shallow ulcer, but no  cyanosis, or clubbing.  NEUROLOGIC: Unresponsive on intubation status.  SKIN: No obvious rash, lesion, leg ulcer ulcer.   LABORATORY PANEL:   CBC  Recent Labs Lab 06/10/15 1435  WBC 11.1*  HGB 13.4  HCT 42.0  PLT 155   ------------------------------------------------------------------------------------------------------------------  Chemistries   Recent Labs Lab 06/10/15 1435  NA 138  K 5.5*  CL 103  CO2 26  GLUCOSE 112*  BUN 48*  CREATININE 2.46*  CALCIUM 8.7*  AST 301*  ALT 150*   ALKPHOS 87  BILITOT 6.7*   ------------------------------------------------------------------------------------------------------------------  Cardiac Enzymes  Recent Labs Lab 06/10/15 1435  TROPONINI 0.20*   ------------------------------------------------------------------------------------------------------------------  RADIOLOGY:  Dg Abd 1 View  06/10/2015   CLINICAL DATA:  64 year old male enteric tube placement. Initial encounter.  EXAM: ABDOMEN - 1 VIEW  COMPARISON:  Portable chest radiograph 1556 hours today.  FINDINGS: Portable AP supine view at 1552 hours. There is no enteric tube visible in the abdomen. There is gaseous distension of the stomach. Non obstructed bowel gas pattern otherwise. On the recent portable chest tubing was looped over the lower neck.  IMPRESSION: 1. Strongly suspect the enteric tube is looped in the pharynx, visible only on the portable chest radiograph from 1556 hours. Recommend removal and replacement. 2. Gaseous distension of the stomach.   Electronically Signed   By: Genevie Ann M.D.   On: 06/10/2015 16:32   Dg Chest Portable 1 View  06/10/2015   ADDENDUM REPORT: 06/10/2015 16:41  ADDENDUM: Study discussed by telephone with Dr. Hinda Kehr on 06/10/2015 at 1636 hours.  We also discussed that the enteric tube appears to be looped in the pharynx/neck on this image.  He advises that both the ET tube and enteric tube have been repositioned, and a repeat abdominal film is pending.   Electronically Signed   By: Genevie Ann M.D.   On: 06/10/2015 16:41   06/10/2015   CLINICAL DATA:  64 year old male intubated. Tachypnea. Initial encounter.  EXAM: PORTABLE CHEST - 1 VIEW  COMPARISON:  1442 hours today, and earlier  FINDINGS: Portable AP supine view at 1556 hours. Endotracheal tube tip at the carina. Decreased ventilation at the left lung base. Stable cardiomegaly and mediastinal contours. Mild crowding of markings elsewhere.  IMPRESSION: 1. Intubated, endotracheal tube tip  at the carina. Retract up to 3 cm 4 Mar I optimal placement. 2. Left greater than right perihilar increased atelectasis.  Electronically Signed: By: Genevie Ann M.D. On: 06/10/2015 16:31   Dg Chest Port 1 View  06/10/2015   CLINICAL DATA:  Tachypnea, shallow breathing.  EXAM: PORTABLE CHEST - 1 VIEW  COMPARISON:  April 08, 2015.  FINDINGS: Stable cardiomegaly. No pneumothorax is noted. Increased central pulmonary vascular congestion is noted. Mild bibasilar opacities are noted concerning for subsegmental atelectasis, edema or effusions. Increased right upper lobe opacity is noted concerning for possible pneumonia or edema. Bony thorax is intact.  IMPRESSION: Cardiomegaly and increased central pulmonary vascular congestion is noted, with increased right upper lobe opacity concerning for pneumonia or possibly edema. Mild bibasilar opacities are noted concerning for subsegmental atelectasis or edema with possible associated minimal pleural effusions.   Electronically Signed   By: Marijo Conception, M.D.   On: 06/10/2015 15:08   Dg Abd Portable 1v  06/10/2015   CLINICAL DATA:  OG tube placement.  EXAM: PORTABLE ABDOMEN - 1 VIEW 4:31 p.m.  COMPARISON:  06/10/2015 3:52 p.m.  FINDINGS: There is an OG tube with tip in the fundus of the  stomach. Endotracheal tube appears in good position 3 cm above the carina.  No visible dilated bowel. Pulmonary infiltrates noted in the right upper lobe and left lower lobe.  IMPRESSION: OG tube tip in the fundus of the stomach.   Electronically Signed   By: Lorriane Shire M.D.   On: 06/10/2015 16:51    EKG:   Orders placed or performed during the hospital encounter of 04/07/15  . EKG 12-Lead  . EKG 12-Lead  . EKG    IMPRESSION AND PLAN:   Acute respiratory failure with hypoxia Community acquired pneumonia Acute on chronic systolic CHF, ejection fraction 35% A. fib with RVR Elevated troponin Bilateral lower extremity cellulitis  Sepsis Acute renal  failure Hyperkalemia Ascites Abnormal liver function test Coagulopathy  The patient will be admitted to ICU. I will continue ventilation and get a pulmonary consult. Continue Cardizem drip for A. fib with RVR and follow-up cardiology consult. Continue IV antibiotics, follow-up CBC, blood culture and sputum culture. Start a CHF protocol, Lasix 40 mg IV every 8 hours, cardiology consult. Follow-up troponin level, give aspirin but hold heparin drip due to coagulopathy, follow-up INR. For hyperkalemia, I will give Kayexalate by OGT. For acute renal failure, I will not give IV fluids due to CHF but the follow-up BMP. I will request a GI consult for abnormal liver function tests and ascites.   The patient is in very critical condition, has multiorgan failure and high risk for cardiac arrest. I will request palliative care consult.  All the records are reviewed and case discussed with ED provider. Management plans discussed with the patient's pastor and attorney and they are in agreement. The patient has no family member or POA.  CODE STATUS: Full code  TOTAL TIME TAKING CARE OF THIS PATIENT: 76 minutes.    Demetrios Loll M.D on 06/10/2015 at 5:16 PM  Between 7am to 6pm - Pager - [phone]  After 6pm go to [email or web address] - password [email or web address] [facility]  [facility]  Office  [phone]  CC: Primary care physician; No PCP Per Patient

## 2015-06-10 NOTE — Progress Notes (Signed)
ANTIBIOTIC CONSULT NOTE - INITIAL  Pharmacy Consult for Vancomycin and Zosyn Indication: pneumonia and rule out sepsis  Allergies  Allergen Reactions  . Penicillins Nausea And Vomiting    Patient Measurements: Height: 5\' 8"  (172.7 cm) Weight: 275 lb 14.4 oz (125.147 kg) IBW/kg (Calculated) : 68.4 Vancomycin Dosing Wt:  91 kg   Vital Signs: Temp: 97.4 F (36.3 C) (08/14 1614) Temp Source: Oral (08/14 1422) BP: 136/87 mmHg (08/14 1614) Pulse Rate: 52 (08/14 1600) Intake/Output from previous day:   Intake/Output from this shift:    Labs:  Recent Labs  06/10/15 1435  WBC 11.1*  HGB 13.4  PLT 155  CREATININE 2.46*   Estimated Creatinine Clearance: 40.1 mL/min (by C-G formula based on Cr of 2.46). No results for input(s): VANCOTROUGH, VANCOPEAK, VANCORANDOM, GENTTROUGH, GENTPEAK, GENTRANDOM, TOBRATROUGH, TOBRAPEAK, TOBRARND, AMIKACINPEAK, AMIKACINTROU, AMIKACIN in the last 72 hours.   Microbiology: No results found for this or any previous visit (from the past 720 hour(s)).  Medical History: Past Medical History  Diagnosis Date  . Dysrhythmia   . Pneumonia 2011  . CHF (congestive heart failure)   . Pneumonia 2011  . Cellulitis     Medications:  Anti-infectives    Start     Dose/Rate Route Frequency Ordered Stop   06/10/15 1654  piperacillin-tazobactam (ZOSYN) 3.375 (3-0.375) G injection    Comments:  HATCH, SHANNIN: cabinet override      06/10/15 1654 06/11/15 0459   06/10/15 1445  piperacillin-tazobactam (ZOSYN) IVPB 3.375 g     3.375 g 100 mL/hr over 30 Minutes Intravenous  Once 06/10/15 1438 06/10/15 1714   06/10/15 1445  vancomycin (VANCOCIN) IVPB 1000 mg/200 mL premix     1,000 mg 200 mL/hr over 60 Minutes Intravenous  Once 06/10/15 1438 06/10/15 1547     Assessment: 64 year old male admitted with acute respiratory failure and hypoxia.  Hx of A fib, CHF, pneumonia, cellulitis.  Was intubated in ED.  Goal of Therapy:  Vancomycin trough level 15-20  mcg/ml  Plan:  Vancomycin 1gm given x1 @ 1445 Zosyn 3.375gm given x1 @ 1700  Vanc dose wt: 91kg; IBW: 68.4 kg; Ke 0.029; VD: 87.57 L Will order Vancomycin 1500mg  q24hr and  Zosyn 4.5 gm q8hr   Lavena Loretto K 06/10/2015,5:30 PM

## 2015-06-10 NOTE — ED Notes (Signed)
Aaron Harrison  Contact number [phone]; [phone]; at bedside with patient.

## 2015-06-10 NOTE — Progress Notes (Signed)
Patient intubated by Dr. Karma Greaser with 8.0 ETT successfully on first attempt.  Patient placed on Servo i vent, assessment done.

## 2015-06-10 NOTE — ED Notes (Signed)
Medication still has not arrived from pharmacy.Will send if patient is on floor.

## 2015-06-10 NOTE — Progress Notes (Signed)
Patient transported to ICU 2.

## 2015-06-10 NOTE — ED Provider Notes (Signed)
[facility] Emergency Department Provider Note  ____________________________________________  Time seen: Approximately 2:34 PM  I have reviewed the triage vital signs and the nursing notes.   HISTORY  Chief Complaint Weakness    HPI Aaron Harrison is a 64 y.o. male with an extensive past medical history including chronic bilateral leg cellulitis, systolic heart failure, chronic atrial fibrillation not on any anticoagulation, and probable liver disease based on the patient's own history who presents with hypoxemia, waxing and waning mental status, and uncontrolled tachycardia.  Reportedly he is on antibiotics for his cellulitis and seen by vascular surgery.  He had an appointment last week and he states that they told him he was getting better.  Upon arrival he appears toxic but states that he has no complaints and only came in because his friend checked on him and called EMS.  He told me that he takes lactulose and also mention that he has some kidney disease but that is mild.  During the course of her conversation his mental status would wax and wane a bit but generally he remained alert and oriented.  When asked on multiple occasions he continues to deny any specific complaints.  Upon arrival his heart rate is in the 140s to 180s in atrial fibrillation.  EMS reports that his initial O2 saturation for them was in the low 70s but it improved to the 90s on nasal cannula.   Past Medical History  Diagnosis Date  . Dysrhythmia   . Pneumonia 2011  . CHF (congestive heart failure)   . Pneumonia 2011  . Cellulitis     Patient Active Problem List   Diagnosis Date Noted  . Chronic systolic heart failure 78/24/2353  . Essential hypertension 05/11/2015  . Bilateral edema of lower extremity   . Cellulitis of lower extremity 04/07/2015  . Atrial fibrillation, chronic 04/07/2015  . Stasis ulcer of lower extremity 04/07/2015    History reviewed. No pertinent past  surgical history.  Current Outpatient Rx  Name  Route  Sig  Dispense  Refill  . aspirin EC 81 MG tablet   Oral   Take 81 mg by mouth daily.         . furosemide (LASIX) 20 MG tablet   Oral   Take 1 tablet (20 mg total) by mouth daily. Patient not taking: Reported on 05/11/2015   30 tablet   0     Allergies Penicillins  Family History  Problem Relation Age of Onset  . Diabetes Mother   . Stroke Mother   . Heart failure Father     Social History Social History  Substance Use Topics  . Smoking status: Never Smoker   . Smokeless tobacco: Never Used  . Alcohol Use: No    Review of Systems Constitutional: Denies fever/chills Eyes: No visual changes. ENT: No sore throat. Cardiovascular: Denies chest pain. Respiratory: Denies shortness of breath. Gastrointestinal: No abdominal pain.  Reports increased distention.  No nausea, no vomiting.  No diarrhea.  No constipation. Genitourinary: Negative for dysuria. Musculoskeletal: Negative for back pain. Skin: Negative for rash.  Chronic cellulitis of his legs which he states is improving Neurological: Negative for headaches, focal weakness or numbness.  Somewhat waxing and waning mental status.  10-point ROS otherwise negative.  ____________________________________________   PHYSICAL EXAM: ED Triage Vitals  Enc Vitals Group     BP 06/10/15 1422 132/89 mmHg     Pulse Rate 06/10/15 1422 180     Resp 06/10/15 1422 22  Temp 06/10/15 1422 98.3 F (36.8 C)     Temp Source 06/10/15 1422 Oral     SpO2 06/10/15 1422 93 %     Weight 06/10/15 1422 275 lb 14.4 oz (125.147 kg)     Height 06/10/15 1422 5\' 8"  (1.727 m)     Head Cir --      Peak Flow --      Pain Score --      Pain Loc --      Pain Edu? --      Excl. in [place]? --       Constitutional: Alert and oriented but his mental status waxes and wanes and he appears toxic based on his color and vital signs. Eyes: Conjunctivae are normal. PERRL. EOMI. Head:  Atraumatic. Nose: No congestion/rhinnorhea. Mouth/Throat: Mucous membranes are moist.  Oropharynx non-erythematous. Neck: No stridor.   Cardiovascular: Tachycardia with irregular rhythm. Grossly normal heart sounds.  Appears somewhat jaundiced. Respiratory: Increased respiratory effort and tachypnea.  No retractions.  Lungs clear to auscultation bilaterally.. Gastrointestinal: Obese and distended with what appears to be ascites.  No tenderness to palpation. Musculoskeletal: No obvious deformities.  Moving all 4 extremities. Neurologic:  Normal speech and language most of the time, but at times he begins to slur his speech and will drift off before returning to baseline. Skin:  Skin is jaundiced but relatively mild.  He has acute on chronic purulent wounds on both of his lower legs.  The bandages that are in place he states it been there for a week and they are caked with dried, green, purulent material.  I removed them to reveal the subacute wounds.  He also has trace edema in bilateral lower extremities with erythema surrounding the wounds.   ____________________________________________   LABS (all labs ordered are listed, but only abnormal results are displayed)  Labs Reviewed  LACTIC ACID, PLASMA - Abnormal; Notable for the following:    Lactic Acid, Venous 2.1 (*)    All other components within normal limits  COMPREHENSIVE METABOLIC PANEL - Abnormal; Notable for the following:    Potassium 5.5 (*)    Glucose, Bld 112 (*)    BUN 48 (*)    Creatinine, Ser 2.46 (*)    Calcium 8.7 (*)    AST 301 (*)    ALT 150 (*)    Total Bilirubin 6.7 (*)    GFR calc non Af Amer 26 (*)    GFR calc Af Amer 31 (*)    All other components within normal limits  LIPASE, BLOOD - Abnormal; Notable for the following:    Lipase 20 (*)    All other components within normal limits  TROPONIN I - Abnormal; Notable for the following:    Troponin I 0.20 (*)    All other components within normal limits  CBC  WITH DIFFERENTIAL/PLATELET - Abnormal; Notable for the following:    WBC 11.1 (*)    RBC 3.99 (*)    MCV 105.2 (*)    MCHC 31.9 (*)    RDW 17.0 (*)    Neutro Abs 9.8 (*)    Lymphs Abs 0.6 (*)    All other components within normal limits  PROTIME-INR - Abnormal; Notable for the following:    Prothrombin Time 22.8 (*)    All other components within normal limits  BLOOD GAS, ARTERIAL - Abnormal; Notable for the following:    pCO2 arterial 69 (*)    pO2, Arterial 69 (*)    Acid-base  deficit 7.4 (*)    All other components within normal limits  CULTURE, BLOOD (ROUTINE X 2)  CULTURE, BLOOD (ROUTINE X 2)  URINE CULTURE  APTT  AMMONIA  LACTIC ACID, PLASMA  URINALYSIS COMPLETEWITH MICROSCOPIC ([facility] ONLY)   ____________________________________________  EKG  ED ECG REPORT I, Victorina Kable, the attending physician, personally viewed and interpreted this ECG.   Date: 06/10/2015  EKG Time: 14:19  Rate: 141  Rhythm: atrial fibrillation, rate 141  Axis: Normal  Intervals:A. fib with RVR  ST&T Change: T wave inversions throughout consistent with ischemia  ____________________________________________  RADIOLOGY  I, Sharren Schnurr, personally viewed and evaluated the plain films as part of my medical decision making.  I also discussed the findings by phone with the radiologist, Dr. Nevada Crane.   Dg Abd 1 View  06/10/2015   CLINICAL DATA:  64 year old male enteric tube placement. Initial encounter.  EXAM: ABDOMEN - 1 VIEW  COMPARISON:  Portable chest radiograph 1556 hours today.  FINDINGS: Portable AP supine view at 1552 hours. There is no enteric tube visible in the abdomen. There is gaseous distension of the stomach. Non obstructed bowel gas pattern otherwise. On the recent portable chest tubing was looped over the lower neck.  IMPRESSION: 1. Strongly suspect the enteric tube is looped in the pharynx, visible only on the portable chest radiograph from 1556 hours. Recommend removal and  replacement. 2. Gaseous distension of the stomach.   Electronically Signed   By: Genevie Ann M.D.   On: 06/10/2015 16:32   Dg Chest Portable 1 View  06/10/2015   ADDENDUM REPORT: 06/10/2015 16:41  ADDENDUM: Study discussed by telephone with Dr. Hinda Kehr on 06/10/2015 at 1636 hours.  We also discussed that the enteric tube appears to be looped in the pharynx/neck on this image.  He advises that both the ET tube and enteric tube have been repositioned, and a repeat abdominal film is pending.   Electronically Signed   By: Genevie Ann M.D.   On: 06/10/2015 16:41   06/10/2015   CLINICAL DATA:  64 year old male intubated. Tachypnea. Initial encounter.  EXAM: PORTABLE CHEST - 1 VIEW  COMPARISON:  1442 hours today, and earlier  FINDINGS: Portable AP supine view at 1556 hours. Endotracheal tube tip at the carina. Decreased ventilation at the left lung base. Stable cardiomegaly and mediastinal contours. Mild crowding of markings elsewhere.  IMPRESSION: 1. Intubated, endotracheal tube tip at the carina. Retract up to 3 cm 4 Mar I optimal placement. 2. Left greater than right perihilar increased atelectasis.  Electronically Signed: By: Genevie Ann M.D. On: 06/10/2015 16:31   Dg Chest Port 1 View  06/10/2015   CLINICAL DATA:  Tachypnea, shallow breathing.  EXAM: PORTABLE CHEST - 1 VIEW  COMPARISON:  April 08, 2015.  FINDINGS: Stable cardiomegaly. No pneumothorax is noted. Increased central pulmonary vascular congestion is noted. Mild bibasilar opacities are noted concerning for subsegmental atelectasis, edema or effusions. Increased right upper lobe opacity is noted concerning for possible pneumonia or edema. Bony thorax is intact.  IMPRESSION: Cardiomegaly and increased central pulmonary vascular congestion is noted, with increased right upper lobe opacity concerning for pneumonia or possibly edema. Mild bibasilar opacities are noted concerning for subsegmental atelectasis or edema with possible associated minimal pleural  effusions.   Electronically Signed   By: Marijo Conception, M.D.   On: 06/10/2015 15:08   Dg Abd Portable 1v  06/10/2015   CLINICAL DATA:  OG tube placement.  EXAM: PORTABLE ABDOMEN - 1 VIEW  4:31 p.m.  COMPARISON:  06/10/2015 3:52 p.m.  FINDINGS: There is an OG tube with tip in the fundus of the stomach. Endotracheal tube appears in good position 3 cm above the carina.  No visible dilated bowel. Pulmonary infiltrates noted in the right upper lobe and left lower lobe.  IMPRESSION: OG tube tip in the fundus of the stomach.   Electronically Signed   By: Lorriane Shire M.D.   On: 06/10/2015 16:51    ____________________________________________   PROCEDURES  Procedure(s) performed: Intubation, see procedure note(s).  INTUBATION Performed by: Hinda Kehr  Required items: required blood products, implants, devices, and special equipment available Patient identity confirmed: provided demographic data and hospital-assigned identification number Time out: Immediately prior to procedure a "time out" was called to verify the correct patient, procedure, equipment, support staff and site/side marked as required.  Indications: acute respiratory failure  Intubation method: Glidescope Laryngoscopy   Preoxygenation: NRB followed by BVM  Sedatives: none Paralytic: Etomidate 100 mg  Tube Size: 8.0 cuffed  Post-procedure assessment: chest rise and ETCO2 monitor Breath sounds: equal and absent over the epigastrium Tube secured with: ETT holder Chest x-ray interpreted by radiologist and me.  Chest x-ray findings: endotracheal tube too deep; RT backed it out 2 cm  Patient tolerated the procedure well with no immediate complications.    Critical Care performed: Yes, see critical care note(s)   CRITICAL CARE Performed by: Hinda Kehr   Total critical care time: 60 minutes  Critical care time was exclusive of separately billable procedures and treating other patients.  Critical care was  necessary to treat or prevent imminent or life-threatening deterioration.  Critical care was time spent personally by me on the following activities: development of treatment plan with patient and/or surrogate as well as nursing, discussions with consultants, evaluation of patient's response to treatment, examination of patient, obtaining history from patient or surrogate, ordering and performing treatments and interventions, ordering and review of laboratory studies, ordering and review of radiographic studies, pulse oximetry and re-evaluation of patient's condition.  ____________________________________________   INITIAL IMPRESSION / ASSESSMENT AND PLAN / ED COURSE  Pertinent labs & imaging results that were available during my care of the patient were reviewed by me and considered in my medical decision making (see chart for details).  The patient is toxic with the appearance of sepsis as well as uncontrolled A. fib with RVR.  I immediately ordered code sepsis orders including empiric antibiotics and full lab work.  In addition, given the patient's verbal history of liver problems and lactulose, I ordered an ammonia.  I am starting with 1 L of normal saline because the patient has known CHF and has had issues with CHF exacerbations in the past.  His blood pressure is elevated and he is hypoxemic; I believe it is likely he has pulmonary vascular congestion and I will do more harm than good if I give him 30 mL/kg of normal saline.  ----------------------------------------- 5:03 PM on 06/10/2015 -----------------------------------------  (Note that documentation was delayed due to multiple ED patients requiring immediate care.)   I have been in and out of the patient's room multiple times over the course of the last hour.  He continued to be stable for some time while we are pursuing a workup.  However, he began to decompensate relatively rapidly with decreased mental status, diaphoresis, and  elevated heart rate to the 180s to 200s.  The patient's friends and his attorney were present, but they were unaware of any DO  NOT RESUSCITATE orders or CODE STATUS recommendations, and the patient has no family or power of attorney.  During our discussion, he became completely unresponsive and began to drop his oxygenation down to the 70s.  We used the bag-valve-mask to pre-oxygenate and then I intubated him without any sedation due to his unresponsiveness and unstable status but he did need paralytics because he was clenching his mouth.  The intubation was successful and without complication.  I had multiple conversations with his attorney and we are proceeding with the plan to admit to the ICU for further management.  He should has multisystem organ failure, most notable with his INR of 2 and T bili of almost 7 with no history of oral anticoagulation.  He also has acute renal failure and an elevated troponin as well as a lactate of greater than 2.  I will continue a second fluid bolus and aiming for ideal body weight rather than his actual body weight.  I discussed the case with both Dr. Wynetta Emery, hospitalist, and Dr. Bridgett Larsson, hospitalist, who will do the admission.   ____________________________________________  FINAL CLINICAL IMPRESSION(S) / ED DIAGNOSES  Final diagnoses:  Acute respiratory failure with hypoxemia  Sepsis, due to unspecified organism  Healthcare-associated pneumonia  Multi-organ failure with liver failure  Elevated troponin I level  Atrial fibrillation, chronic  Bilateral edema of lower extremity  Cellulitis of left lower extremity  Cellulitis of right lower extremity      NEW MEDICATIONS STARTED DURING THIS VISIT:  New Prescriptions   No medications on file     Hinda Kehr, MD 06/10/15 1706

## 2015-06-10 NOTE — ED Notes (Signed)
Respiratory at bedside. MD at bedside. Vitals updated.

## 2015-06-10 NOTE — ED Notes (Signed)
MD in room talking with patient's caregivers and lawyer regarding patient care/code status.

## 2015-06-10 NOTE — Progress Notes (Signed)
Spoke with Aaron Harrison on phone who is a friend/lawyer of patient. ( Home # [phone],  Cell [phone]), Work # [phone])   He stated that patient does not have any family members, as patient has never married and never had children.  He also stated that patient has asked him for money for the past 2 months for his medications.  He stated that patient had income from rental property but has lost the property in foreclosure recently.  He stated that Aaron Harrison (friend) had went to patient house today and found him incontinent in chair, and that the house was very messy and smelled horrible.  He stated that patient had drove his car Friday and he could tell that the patient was not thinking clearly.  Mr. Aaron Harrison also stated that when he had went to visit patient recently that he felt patient was depressed and lonely.  He stated that patient had recently had  home health that was coming in to dress his legs. Did not know the name of agency.  As far as he knew patient did not use any medical equipment at home.  He was unsure of the medicine that patient was taking and if he had been taking them on a regular basis.  He did not know if patient has PCP, but did say that he thought he had seen Dr. Delana Harrison recently about his legs.  He stated that patient had been in the hospital within the last year possibly for pneumonia. Mr. Aaron Harrison stated that patient has not been taking care of his self and his house.  He is worried about patient returning home that he might not be able to take care of himself.  Patient attends [employer] and his pastor is Aaron Harrison and Aaron Harrison, and that the church and scout group of which Mr Aaron Harrison is a board member had been given donations to help patient. Care Management consult ordered for discharge planning,

## 2015-06-10 NOTE — ED Notes (Signed)
MD notified of patients deteriorating condition and vitals.

## 2015-06-10 NOTE — ED Notes (Signed)
Pt arrived via EMS from home. Per EMS, pt's family said he had several falls within the last few weeks. Per EMS, pt has increased shallow breathing , tachypnea, and "has not been acting right" per family. Pt is alert and oriented to person on arrival. States no pain at present.

## 2015-06-11 ENCOUNTER — Inpatient Hospital Stay (HOSPITAL_COMMUNITY)
Admit: 2015-06-11 | Discharge: 2015-06-11 | Disposition: A | Discharge status: Home / Self Care | Payer: Medicaid Other | Attending: Internal Medicine | Admitting: Internal Medicine

## 2015-06-11 ENCOUNTER — Ambulatory Visit: Payer: Self-pay | Admitting: Family

## 2015-06-11 ENCOUNTER — Encounter: Payer: Self-pay | Admitting: Physician Assistant

## 2015-06-11 ENCOUNTER — Inpatient Hospital Stay: Payer: Medicaid Other

## 2015-06-11 DIAGNOSIS — I482 Chronic atrial fibrillation: Secondary | ICD-10-CM

## 2015-06-11 DIAGNOSIS — IMO0002 Reserved for concepts with insufficient information to code with codable children: Secondary | ICD-10-CM | POA: Insufficient documentation

## 2015-06-11 DIAGNOSIS — I313 Pericardial effusion (noninflammatory): Secondary | ICD-10-CM

## 2015-06-11 DIAGNOSIS — K7290 Hepatic failure, unspecified without coma: Secondary | ICD-10-CM

## 2015-06-11 DIAGNOSIS — I5023 Acute on chronic systolic (congestive) heart failure: Secondary | ICD-10-CM

## 2015-06-11 DIAGNOSIS — J9601 Acute respiratory failure with hypoxia: Secondary | ICD-10-CM

## 2015-06-11 DIAGNOSIS — A419 Sepsis, unspecified organism: Secondary | ICD-10-CM

## 2015-06-11 LAB — COMPREHENSIVE METABOLIC PANEL
ALBUMIN: 2.8 g/dL — AB (ref 3.5–5.0)
ALT: 112 U/L — ABNORMAL HIGH (ref 17–63)
ANION GAP: 8 (ref 5–15)
AST: 108 U/L — ABNORMAL HIGH (ref 15–41)
Alkaline Phosphatase: 68 U/L (ref 38–126)
BILIRUBIN TOTAL: 5.4 mg/dL — AB (ref 0.3–1.2)
BUN: 55 mg/dL — ABNORMAL HIGH (ref 6–20)
CO2: 24 mmol/L (ref 22–32)
Calcium: 7.9 mg/dL — ABNORMAL LOW (ref 8.9–10.3)
Chloride: 106 mmol/L (ref 101–111)
Creatinine, Ser: 2.64 mg/dL — ABNORMAL HIGH (ref 0.61–1.24)
GFR calc non Af Amer: 24 mL/min — ABNORMAL LOW (ref >60)
GFR, EST AFRICAN AMERICAN: 28 mL/min — AB (ref >60)
GLUCOSE: 109 mg/dL — AB (ref 65–99)
POTASSIUM: 4.8 mmol/L (ref 3.5–5.1)
SODIUM: 138 mmol/L (ref 135–145)
TOTAL PROTEIN: 6 g/dL — AB (ref 6.5–8.1)

## 2015-06-11 LAB — GLUCOSE, CAPILLARY
Glucose-Capillary: 83 mg/dL (ref 65–99)
Glucose-Capillary: 92 mg/dL (ref 65–99)
Glucose-Capillary: 96 mg/dL (ref 65–99)

## 2015-06-11 LAB — TROPONIN I
TROPONIN I: 0.22 ng/mL — AB (ref <0.031)
TROPONIN I: 0.27 ng/mL — AB (ref <0.031)

## 2015-06-11 MED ORDER — AMMONIUM LACTATE 12 % EX LOTN
TOPICAL_LOTION | CUTANEOUS | Status: DC | PRN
  Administered 2015-06-11 – 2015-06-15 (×2): via TOPICAL
  Filled 2015-06-11: qty 400

## 2015-06-11 MED ORDER — HEPARIN BOLUS VIA INFUSION
4850.0000 [IU] | Freq: Once | INTRAVENOUS | Status: AC
  Administered 2015-06-11: 4850 [IU] via INTRAVENOUS
  Filled 2015-06-11: qty 4850

## 2015-06-11 MED ORDER — HEPARIN (PORCINE) IN NACL 100-0.45 UNIT/ML-% IJ SOLN
2150.0000 [IU]/h | INTRAMUSCULAR | Status: DC
  Administered 2015-06-11: 1400 [IU]/h via INTRAVENOUS
  Administered 2015-06-12: 1950 [IU]/h via INTRAVENOUS
  Administered 2015-06-12: 1750 [IU]/h via INTRAVENOUS
  Administered 2015-06-13: 1950 [IU]/h via INTRAVENOUS
  Administered 2015-06-13: 2150 [IU]/h via INTRAVENOUS
  Filled 2015-06-11 (×9): qty 250

## 2015-06-11 MED ORDER — PHENYLEPHRINE HCL 10 MG/ML IJ SOLN
0.0000 ug/min | INTRAMUSCULAR | Status: DC
  Administered 2015-06-11: 20 ug/min via INTRAVENOUS
  Filled 2015-06-11: qty 1

## 2015-06-11 MED ORDER — LACTULOSE 10 GM/15ML PO SOLN
30.0000 g | Freq: Two times a day (BID) | ORAL | Status: DC
  Administered 2015-06-11 – 2015-06-14 (×6): 30 g via ORAL
  Filled 2015-06-11 (×6): qty 60

## 2015-06-11 MED ORDER — AMIODARONE LOAD VIA INFUSION
150.0000 mg | Freq: Once | INTRAVENOUS | Status: AC
  Administered 2015-06-11: 150 mg via INTRAVENOUS
  Filled 2015-06-11: qty 83.34

## 2015-06-11 MED ORDER — AMIODARONE HCL IN DEXTROSE 360-4.14 MG/200ML-% IV SOLN
30.0000 mg/h | INTRAVENOUS | Status: DC
  Administered 2015-06-12 – 2015-06-16 (×10): 30 mg/h via INTRAVENOUS
  Filled 2015-06-11 (×21): qty 200

## 2015-06-11 MED ORDER — AMIODARONE HCL IN DEXTROSE 360-4.14 MG/200ML-% IV SOLN
60.0000 mg/h | INTRAVENOUS | Status: AC
  Administered 2015-06-11 (×2): 60 mg/h via INTRAVENOUS
  Filled 2015-06-11: qty 200

## 2015-06-11 MED ORDER — FUROSEMIDE 10 MG/ML IJ SOLN
INTRAMUSCULAR | Status: AC
  Filled 2015-06-11: qty 4

## 2015-06-11 MED ORDER — VITAL HIGH PROTEIN PO LIQD: 1000.0000 mL | ORAL | Status: DC

## 2015-06-11 MED ORDER — HEPARIN SODIUM (PORCINE) 5000 UNIT/ML IJ SOLN
7500.0000 [IU] | Freq: Three times a day (TID) | INTRAMUSCULAR | Status: DC
  Filled 2015-06-11: qty 2

## 2015-06-11 MED ORDER — FUROSEMIDE 10 MG/ML IJ SOLN
INTRAMUSCULAR | Status: AC
  Filled 2015-06-11: qty 2

## 2015-06-11 NOTE — Progress Notes (Signed)
Initial Nutrition Assessment    INTERVENTION:   EN: recommend starting Vital High Protein at rate of 20 ml/hr with goal rate of 60 ml/hr providing 1440 kcals, 127 g of protein. Recommend addition of Prostat daily providing additional 15 g of protein, 100 kcals. Monitor electrolytes; will make recommendations regarding formula change if needed. Continue to assess   NUTRITION DIAGNOSIS:   Inadequate oral intake related to acute illness as evidenced by NPO status.   GOAL:   Patient will meet greater than or equal to 90% of their needs  MONITOR:    (Energy Intake, EN, Digestive System, Electrolyte/Renal Profile, Glucose Profile)  REASON FOR ASSESSMENT:   Ventilator    ASSESSMENT:    Pt admitted with acute respiratory failure with pneumonia, sepsis, ARF with hyperkalemia; currently sedated on vent  Past Medical History  Diagnosis Date  . Chronic atrial fibrillation     a. not on long term anticoagulation  . Pneumonia 2011  . Chronic systolic CHF (congestive heart failure)   . Cellulitis     Diet Order:  Diet NPO time specified   Electrolyte and Renal Profile: BMP pending for today  Recent Labs Lab 06/10/15 1435  BUN 48*  CREATININE 2.46*  NA 138  K 5.5*   Glucose Profile: No results for input(s): GLUCAP in the last 72 hours. Protein Profile:  Recent Labs Lab 06/10/15 1435  ALBUMIN 3.5   Nutritional Anemia Profile:  CBC Latest Ref Rng 06/10/2015 04/11/2015 04/09/2015  WBC 3.8 - 10.6 K/uL 11.1(H) 2.8(L) 2.9(L)  Hemoglobin 13.0 - 18.0 g/dL 13.4 15.2 14.0  Hematocrit 40.0 - 52.0 % 42.0 46.0 43.5  Platelets 150 - 440 K/uL 155 110(L) 89(L)   Gastrointestinal Profile: aamonia wdl  Digestive System: NG  in place, abdomen obese  Meds: NS at 50 ml/hr, lasix, fentanyl, versed, diprivan discontinued, neosynphrine, lactulose,received kayexalate  Skin:  Reviewed, no issues  Last BM:  8/14  Height:   Ht Readings from Last 1 Encounters:  06/10/15 5\' 8"  (1.727 m)     Weight:   Wt Readings from Last 1 Encounters:  06/11/15 273 lb 13 oz (124.2 kg)   Wt Readings from Last 10 Encounters:  06/11/15 273 lb 13 oz (124.2 kg)  05/11/15 263 lb (119.296 kg)  04/11/15 228 lb 6.4 oz (103.602 kg)    Ideal Body Weight:   70 kg  BMI:  Body mass index is 41.64 kg/(m^2).  Estimated Nutritional Needs:   Kcal:  1364-1736 kcals (11-14 kcals/kg)   Protein:  140-175 g (2.0-2.5 g/kg) using IBW 70 kg  Fluid:  1750-2100 mL (25-30 ml/kg)   HIGH Care Level  Kerman Passey MS, RD, LDN [phone] Pager

## 2015-06-11 NOTE — Progress Notes (Signed)
*  PRELIMINARY RESULTS* Echocardiogram 2D Echocardiogram has been performed.  Aaron Harrison 06/11/2015, 8:29 AM

## 2015-06-11 NOTE — Consult Note (Signed)
WOC wound consult note Reason for Consult:Bilateral lower extremity venous stasis chronic skin changes.  Wound type: Venous insufficiency Pressure Ulcer POA: N/A Measurement:Scattered 2 cm x 2 cm scabbed lesions to anterior and posterior lower legs bilaterally.  Wound QPY:[id number] Drainage (amount, consistency, odor) None noted Periwound:Erythema and edema Dressing procedure/placement/frequency:Cleanse bilateral legs with soap and water and pat dry.  Apply Amlactin cream daily.  [place] team will follow and re-evaluate for need for compression when more medically stable.   Domenic Moras RN BSN [place] Pager [phone]

## 2015-06-11 NOTE — Op Note (Signed)
*   No surgery found *  1:21 AM  PATIENT:  Aaron Harrison  64 y.o. male  PRE-OPERATIVE DIAGNOSIS: Sepsis  POST-OPERATIVE DIAGNOSIS:  Same  PROCEDURE: central line placement  SURGEON:  Florene Glen MD, FACS   ANESTHESIA:   local   Details of Procedure: Consent could not be obtained as the patient is intubated and sedated and no family is present. Dr. Nadara Mustard in the nursing staff edema this an emergency.  After performing a surgical timeout patient was properly identified as was the intended procedure. He was then prepped and draped sterile fashion and utilizing Down mask and sterile procedure: A static was infiltrated into the skin and subcutaneous tissues tissues around the left clavicle. Large bore needle was placed in the left subclavian vein without difficulty and a Seldinger wire was placed. An incision was made and the previously flushed introducer dilator was placed over the Seldinger wire file followed by placement of the triple catheter which should been flushed as well. The Seldinger wire was removed and all ports aspirated and flushed with saline and Hep-Lock. The catheter was sutured to the skin of the anterior chest wall with the provided silk suture. Sterile dressing was placed. Patient was removed from Trendelenburg position and left in stable condition in the ICU on a ventilator a postoperative chest film has been ordered and will be personally reviewed. This was all discussed with the nursing staff.   Florene Glen, MD FACS

## 2015-06-11 NOTE — Consult Note (Signed)
ANTICOAGULATION CONSULT NOTE - Initial Consult  Pharmacy Consult for heparin Indication: atrial fibrillation  Allergies  Allergen Reactions  . Penicillins Nausea And Vomiting    Patient Measurements: Height: 5\' 8"  (172.7 cm) Weight: 273 lb 13 oz (124.2 kg) IBW/kg (Calculated) : 68.4 Heparin Dosing Weight: 97.4  Vital Signs: Temp: 98.1 F (36.7 C) (08/15 1300) BP: 112/100 mmHg (08/15 1300) Pulse Rate: 132 (08/15 1520)  Labs:  Recent Labs  06/10/15 1435 06/11/15 0040 06/11/15 0312 06/11/15 1519  HGB 13.4  --   --   --   HCT 42.0  --   --   --   PLT 155  --   --   --   APTT 32  --   --   --   LABPROT 22.8*  --   --   --   INR 2.00  --   --   --   CREATININE 2.46*  --   --  2.64*  TROPONINI 0.20* 0.22* 0.27*  --     Estimated Creatinine Clearance: 37.2 mL/min (by C-G formula based on Cr of 2.64).   Medical History: Past Medical History  Diagnosis Date  . Chronic atrial fibrillation     a. not on long term anticoagulation  . Pneumonia 2011  . Chronic systolic CHF (congestive heart failure)   . Cellulitis     Medications:  Scheduled:  . amiodarone  150 mg Intravenous Once  . antiseptic oral rinse  7 mL Mouth Rinse QID  . aspirin  81 mg Per Tube Daily  . chlorhexidine gluconate  15 mL Mouth Rinse BID  . feeding supplement (VITAL HIGH PROTEIN)  1,000 mL Per Tube Q24H  . furosemide      . furosemide      . heparin  7,500 Units Subcutaneous 3 times per day  . lactulose  30 g Oral BID  . piperacillin-tazobactam (ZOSYN)  IV  4.5 g Intravenous 3 times per day    Assessment: Pt is a 64 year old male in Afib Goal of Therapy:  Heparin level 0.3-0.7 units/ml Monitor platelets by anticoagulation protocol: Yes   Plan:  Give 4850 units bolus x 1 Start heparin infusion at 1400 units/hr Check anti-Xa level in 6 hours and daily while on heparin Continue to monitor H&H and platelets  Hashir Deleeuw D Ivori Storr 06/11/2015,6:20 PM

## 2015-06-11 NOTE — Progress Notes (Signed)
Patient's blood pressure 64/51 with MAP-57. Neosynephrine drip started @20 

## 2015-06-11 NOTE — Progress Notes (Signed)
Central line placed by Dr. Burt Knack, patient vital signs trending down with Dr. Lavetta Nielsen at bedside orders received for bolus and Neo gtt, pt BP improved WDL set by MD. Neo held at this time.  Decreased temp with Bair hugger in place. No Further orders received  See CHL for further update.

## 2015-06-11 NOTE — Progress Notes (Signed)
[place] [facility] [place] NAME: Aaron Harrison    MR#:  [id number]  DATE OF BIRTH:  Sep 25, 1951  SUBJECTIVE:  CHIEF COMPLAINT:   Chief Complaint  Patient presents with  . Weakness    "not acting right" per family   is intubated on ventilator, but is very awake and following commands  REVIEW OF SYSTEMS:  Review of Systems  Unable to perform ROS: critical illness   DRUG ALLERGIES:   Allergies  Allergen Reactions  . Penicillins Nausea And Vomiting   VITALS:  Blood pressure 112/100, pulse 112, temperature 98.1 F (36.7 C), temperature source Oral, resp. rate 18, height 5\' 8"  (1.727 m), weight 124.2 kg (273 lb 13 oz), SpO2 92 %. PHYSICAL EXAMINATION:  Physical Exam  Constitutional: He is well-developed, well-nourished, and in no distress.  HENT:  Head: Normocephalic and atraumatic.  Endotracheal tube in place  Eyes: Conjunctivae and EOM are normal. Pupils are equal, round, and reactive to light.  Neck: Normal range of motion. Neck supple. No tracheal deviation present. No thyromegaly present.  Cardiovascular: Normal rate, regular rhythm and normal heart sounds.   Pulmonary/Chest: Effort normal and breath sounds normal. No respiratory distress. He has no wheezes. He exhibits no tenderness.  Abdominal: Soft. Bowel sounds are normal. He exhibits no distension. There is no tenderness.  Musculoskeletal: Normal range of motion.  Neurological: He is alert. No cranial nerve deficit.  Following simple commands  Skin: Skin is warm and dry. No rash noted.  Psychiatric: Mood and affect normal.   LABORATORY PANEL:   CBC  Recent Labs Lab 06/10/15 1435  WBC 11.1*  HGB 13.4  HCT 42.0  PLT 155   ------------------------------------------------------------------------------------------------------------------ Chemistries   Recent Labs Lab 06/11/15 1519  NA 138  K 4.8  CL 106  CO2 24  GLUCOSE 109*  BUN 55*  CREATININE 2.64*   CALCIUM 7.9*  AST 108*  ALT 112*  ALKPHOS 68  BILITOT 5.4*   RADIOLOGY:  Dg Chest Port 1 View  06/11/2015   CLINICAL DATA:  Central line placement.  Initial encounter.  EXAM: PORTABLE CHEST - 1 VIEW  COMPARISON:  Chest radiograph performed 06/10/2015  FINDINGS: The patient's endotracheal tube is seen ending 3 cm above the carina. A left subclavian line is noted ending about the distal SVC.  A small left pleural effusion is noted. Right upper lobe airspace opacification is concerning for pneumonia, similar in appearance to the recent prior study. Underlying vascular congestion is noted. No pneumothorax is seen  The cardiomediastinal silhouette is enlarged. No acute osseous abnormalities are identified.  IMPRESSION: 1. Endotracheal tube seen ending 3 cm above the carina. 2. Left subclavian line noted ending about the distal SVC. 3. Right upper lobe airspace opacification, compatible with pneumonia, similar in appearance to the prior study. 4. Small left pleural effusion noted. Cardiomegaly and vascular congestion.   Electronically Signed   By: Garald Balding M.D.   On: 06/11/2015 02:08   Dg Chest Port 1 View  06/10/2015   CLINICAL DATA:  Status post intubation  EXAM: PORTABLE CHEST - 1 VIEW  COMPARISON:  06/10/2015  FINDINGS: Cardiac shadow remains enlarged. A nasogastric catheter and endotracheal tube are again seen. The endotracheal tube is been withdrawn and now lies in satisfactory position. Increasing consolidation within the right upper lobe is noted. Some patchy changes are noted in the left lung base as well. The left changes are stable from the recent exam.  IMPRESSION: Increasing right  upper lobe consolidation.   Electronically Signed   By: Inez Catalina M.D.   On: 06/10/2015 19:05   Dg Abd Portable 1v  06/10/2015   CLINICAL DATA:  OG tube placement.  EXAM: PORTABLE ABDOMEN - 1 VIEW 4:31 p.m.  COMPARISON:  06/10/2015 3:52 p.m.  FINDINGS: There is an OG tube with tip in the fundus of the  stomach. Endotracheal tube appears in good position 3 cm above the carina.  No visible dilated bowel. Pulmonary infiltrates noted in the right upper lobe and left lower lobe.  IMPRESSION: OG tube tip in the fundus of the stomach.   Electronically Signed   By: Lorriane Shire M.D.   On: 06/10/2015 16:51   ASSESSMENT AND PLAN:   * Acute respiratory failure with hypoxia: intubated in ED, extubated just earlier today and on BIPAP now.  * Community acquired pneumonia: continue zosyn  * Acute on chronic systolic CHF, ejection fraction 35%:  Unable to continue diuresis at this time due to  hypotension  Pending echo  * A. fib with RVR:  Not able to use rate controlling medication due to hypotension In the past.  He had refused anticoagulation Continue aspirin for now  * Elevated troponin: Due to supply demand ischemia  * Bilateral lower extremity cellulitis: Zosyn  * Sepsis: Continue Zosyn  * Acute renal failure: Possible cardiorenal syndrome due to underlying CHF.  Consult nephrology  * Hyperkalemia: Resolved  * Ascites/Abnormal liver function test: GI consult  * Coagulopathy: Monitor coags    All the records are reviewed and case discussed with Care Management/Social Worker. Management plans discussed with the patient, family and they are in agreement.  CODE STATUS: Full code   TOTAL TIME (  critical care ) TAKING CARE OF THIS PATIENT: 35 minutes.   More than 50% of the time was spent in counseling/coordination of care: YES  POSSIBLE D/C IN 2-3 DAYS, DEPENDING ON CLINICAL CONDITION.   [facility], Calynn Ferrero M.D on 06/11/2015 at 4:26 PM  Between 7am to 6pm - Pager - [phone]  After 6pm go to [email or web address] - password [email or web address] [facility]  [place] [facility]  Office  [phone]  CC:  Primary care physician; No PCP Per Patient

## 2015-06-11 NOTE — Consult Note (Signed)
Cardiology Consultation Note  Patient ID: Aaron Harrison, MRN: [id number], DOB/AGE: 1951/04/08 64 y.o. Admit date: 06/10/2015   Date of Consult: 06/11/2015 Primary Physician: No PCP Per Patient Primary Cardiologist: New to [facility]  Chief Complaint: Weakness Reason for Consult: Afib with RVR, elevated troponin   HPI: 64 y.o. male with h/o chronic Afib not on long term full dose anticoagulation, chronic systolic CHF, history of pneumonia, and cellulitis who was recently admitted to [facility] in June for cellulitis presented to [facility] on 8/14 with generalized malaise, weakness, and not acting right and was found to have acute respiratory failure in the setting of HCAP, sepsis, bilateral lower extremity cellulitis, Afib with RVR, and acute renal failure.   He was recently hospitalized as above in after not being on any outpatient medications in June and consulted on by outside group for Afib with RVR in the setting of lower extremity cellulitis and acute on chronic systolic CHF. EF during that admission was found to he 30-35%, no prior to compare to, no regional wall motion abnormalities, mild AI. He was continued on Lasix 20 mg daily on discharged. He followed up with heart failure clinic upon discharge and was noted to be in Afib with RVR in the 120s. He reported this to be his baseline. He was advised to start Coreg, though he declined. He was not taking his Lasix as directed as he did not want to go to the restroom. He was advised to take his medications as directed.   History is taken from prior notes as patient is intubated and there is no family present to interview. He presented to [facility] on 8/14 with acute onset of weakness. He was found to have acute respiratory failure in the setting of pneumonia vs edema. He appeared toxic but had no complaints. He was found to be in Afib with RVR with heart rates in the 140s to 180s. He required intubation 2/2 to O2 sats in the 70s. There was no family present to discuss  code status. He was started on IV cardizem with improvement in his heart rate into the low 100s to upper 90s, though his BP has been soft into the 49F to 02O systolic requiring pressors. Troponins were mildly elevated and flat trending with a peak at 0.27. EKG showed Afib with RVR, 141 bpm, right axis deviation, inferolateral TWI. Lactic acid continues to trened upwards at 4. Cardiology is consulted for management of his Afib, elevated troponin, and systolic CHF.        Past Medical History  Diagnosis Date  . Dysrhythmia   . Pneumonia 2011  . CHF (congestive heart failure)   . Pneumonia 2011  . Cellulitis       Most Recent Cardiac Studies: Echo 04/08/2015:   Study Conclusions  - Left ventricle: The cavity size was normal. Systolic function was moderately to severely reduced. The estimated ejection fraction was in the range of 30% to 35%. Wall motion was normal; there were no regional wall motion abnormalities. - Aortic valve: There was mild regurgitation.  Impressions:  - moderately reduced overall left ventricular function ejection fraction between 30 and 35%   Surgical History: History reviewed. No pertinent past surgical history.   Home Meds: Prior to Admission medications   Medication Sig Start Date End Date Taking? Authorizing Provider  aspirin EC 81 MG tablet Take 81 mg by mouth daily.    Historical Provider, MD  furosemide (LASIX) 20 MG tablet Take 1 tablet (20 mg total) by mouth  daily. Patient not taking: Reported on 05/11/2015 04/11/15   Max Sane, MD    Inpatient Medications:  . antiseptic oral rinse  7 mL Mouth Rinse QID  . aspirin  81 mg Per Tube Daily  . chlorhexidine gluconate  15 mL Mouth Rinse BID  . furosemide  40 mg Intravenous 3 times per day  . heparin  5,000 Units Subcutaneous 3 times per day  . lactulose  30 g Oral BID  . piperacillin-tazobactam (ZOSYN)  IV  4.5 g Intravenous 3 times per day   . sodium chloride 75 mL/hr (06/11/15 0252)  .  phenylephrine (NEO-SYNEPHRINE) Adult infusion 40 mcg/min (06/11/15 1024)    Allergies:  Allergies  Allergen Reactions  . Penicillins Nausea And Vomiting    Social History   Social History  . Marital Status: Single    Spouse Name: N/A  . Number of Children: N/A  . Years of Education: N/A   Occupational History  . Not on file.   Social History Main Topics  . Smoking status: Never Smoker   . Smokeless tobacco: Never Used  . Alcohol Use: No  . Drug Use: No  . Sexual Activity: Not on file   Other Topics Concern  . Not on file   Social History Narrative     Family History  Problem Relation Age of Onset  . Diabetes Mother   . Stroke Mother   . Heart failure Father      Review of Systems: Review of Systems  Unable to perform ROS: intubated    Labs:  Recent Labs  06/09/15 2025 06/10/15 1435 06/11/15 0040 06/11/15 0312  TROPONINI 0.26* 0.20* 0.22* 0.27*   Lab Results  Component Value Date   WBC 11.1* 06/10/2015   HGB 13.4 06/10/2015   HCT 42.0 06/10/2015   MCV 105.2* 06/10/2015   PLT 155 06/10/2015    Recent Labs Lab 06/10/15 1435  NA 138  K 5.5*  CL 103  CO2 26  BUN 48*  CREATININE 2.46*  CALCIUM 8.7*  PROT 7.2  BILITOT 6.7*  ALKPHOS 87  ALT 150*  AST 301*  GLUCOSE 112*   Lab Results  Component Value Date   TRIG 68 06/10/2015   No results found for: DDIMER  Radiology/Studies:  Dg Abd 1 View  06/10/2015   CLINICAL DATA:  64 year old male enteric tube placement. Initial encounter.  EXAM: ABDOMEN - 1 VIEW  COMPARISON:  Portable chest radiograph 1556 hours today.  FINDINGS: Portable AP supine view at 1552 hours. There is no enteric tube visible in the abdomen. There is gaseous distension of the stomach. Non obstructed bowel gas pattern otherwise. On the recent portable chest tubing was looped over the lower neck.  IMPRESSION: 1. Strongly suspect the enteric tube is looped in the pharynx, visible only on the portable chest radiograph from 1556  hours. Recommend removal and replacement. 2. Gaseous distension of the stomach.   Electronically Signed   By: Genevie Ann M.D.   On: 06/10/2015 16:32   Dg Chest Port 1 View  06/11/2015   CLINICAL DATA:  Central line placement.  Initial encounter.  EXAM: PORTABLE CHEST - 1 VIEW  COMPARISON:  Chest radiograph performed 06/10/2015  FINDINGS: The patient's endotracheal tube is seen ending 3 cm above the carina. A left subclavian line is noted ending about the distal SVC.  A small left pleural effusion is noted. Right upper lobe airspace opacification is concerning for pneumonia, similar in appearance to the recent prior study. Underlying vascular  congestion is noted. No pneumothorax is seen  The cardiomediastinal silhouette is enlarged. No acute osseous abnormalities are identified.  IMPRESSION: 1. Endotracheal tube seen ending 3 cm above the carina. 2. Left subclavian line noted ending about the distal SVC. 3. Right upper lobe airspace opacification, compatible with pneumonia, similar in appearance to the prior study. 4. Small left pleural effusion noted. Cardiomegaly and vascular congestion.   Electronically Signed   By: Garald Balding M.D.   On: 06/11/2015 02:08   Dg Chest Port 1 View  06/10/2015   CLINICAL DATA:  Status post intubation  EXAM: PORTABLE CHEST - 1 VIEW  COMPARISON:  06/10/2015  FINDINGS: Cardiac shadow remains enlarged. A nasogastric catheter and endotracheal tube are again seen. The endotracheal tube is been withdrawn and now lies in satisfactory position. Increasing consolidation within the right upper lobe is noted. Some patchy changes are noted in the left lung base as well. The left changes are stable from the recent exam.  IMPRESSION: Increasing right upper lobe consolidation.   Electronically Signed   By: Inez Catalina M.D.   On: 06/10/2015 19:05   Dg Chest Portable 1 View  06/10/2015   ADDENDUM REPORT: 06/10/2015 16:41  ADDENDUM: Study discussed by telephone with Dr. Hinda Kehr on  06/10/2015 at 1636 hours.  We also discussed that the enteric tube appears to be looped in the pharynx/neck on this image.  He advises that both the ET tube and enteric tube have been repositioned, and a repeat abdominal film is pending.   Electronically Signed   By: Genevie Ann M.D.   On: 06/10/2015 16:41   06/10/2015   CLINICAL DATA:  64 year old male intubated. Tachypnea. Initial encounter.  EXAM: PORTABLE CHEST - 1 VIEW  COMPARISON:  1442 hours today, and earlier  FINDINGS: Portable AP supine view at 1556 hours. Endotracheal tube tip at the carina. Decreased ventilation at the left lung base. Stable cardiomegaly and mediastinal contours. Mild crowding of markings elsewhere.  IMPRESSION: 1. Intubated, endotracheal tube tip at the carina. Retract up to 3 cm 4 Mar I optimal placement. 2. Left greater than right perihilar increased atelectasis.  Electronically Signed: By: Genevie Ann M.D. On: 06/10/2015 16:31   Dg Chest Port 1 View  06/10/2015   CLINICAL DATA:  Tachypnea, shallow breathing.  EXAM: PORTABLE CHEST - 1 VIEW  COMPARISON:  April 08, 2015.  FINDINGS: Stable cardiomegaly. No pneumothorax is noted. Increased central pulmonary vascular congestion is noted. Mild bibasilar opacities are noted concerning for subsegmental atelectasis, edema or effusions. Increased right upper lobe opacity is noted concerning for possible pneumonia or edema. Bony thorax is intact.  IMPRESSION: Cardiomegaly and increased central pulmonary vascular congestion is noted, with increased right upper lobe opacity concerning for pneumonia or possibly edema. Mild bibasilar opacities are noted concerning for subsegmental atelectasis or edema with possible associated minimal pleural effusions.   Electronically Signed   By: Marijo Conception, M.D.   On: 06/10/2015 15:08   Dg Abd Portable 1v  06/10/2015   CLINICAL DATA:  OG tube placement.  EXAM: PORTABLE ABDOMEN - 1 VIEW 4:31 p.m.  COMPARISON:  06/10/2015 3:52 p.m.  FINDINGS: There is an OG tube  with tip in the fundus of the stomach. Endotracheal tube appears in good position 3 cm above the carina.  No visible dilated bowel. Pulmonary infiltrates noted in the right upper lobe and left lower lobe.  IMPRESSION: OG tube tip in the fundus of the stomach.   Electronically Signed  By: Lorriane Shire M.D.   On: 06/10/2015 16:51    EKG: Afib with RVR, 141 bpm, right axis deviation, inferolateral TWI  Weights: Filed Weights   06/10/15 1422 06/11/15 0432  Weight: 275 lb 14.4 oz (125.147 kg) 273 lb 13 oz (124.2 kg)     Physical Exam: Blood pressure 68/49, pulse 87, temperature 95.7 F (35.4 C), temperature source Oral, resp. rate 16, height 5\' 8"  (1.727 m), weight 273 lb 13 oz (124.2 kg), SpO2 96 %. Body mass index is 41.64 kg/(m^2). General:Intubated. Head: Normocephalic, atraumatic, sclera non-icteric, no xanthomas, nares are without discharge.  Neck: Negative for carotid bruits. JVD not elevated. Lungs: Intubated with rhonchi.  Heart: RRR with S1 S2. No murmurs, rubs, or gallops appreciated. Abdomen: Soft, non-tender, non-distended with normoactive bowel sounds. No hepatomegaly. No rebound/guarding. No obvious abdominal masses. Msk:  Strength and tone appear normal for age. Extremities: No clubbing or cyanosis. No edema.   Neuro: Intubated.  Psych: Intubated.     Assessment and Plan:   1. Acute respiratory failure with sepsis: -Likely multifactorial in the setting of healthy care associated pneumonia (given his recent hospitalization in June) and likely acute on chronic systolic CHF -Currently intubated, wean as able -Soft blood pressures are precluding IV Lasix at this time -Continue antibiotic, nebs, and steroids treatment per IM   2. Chronic Afib with RVR: -Initially with rates up into the 180s, likely exacerbated by underlying PNA and cellulitis  -Improved into the low 100's to upper 90s -Hypotension precludes usage of Cardizem or beta blockers at this time -Acute renal  failure precludes usage of digoxin at this time for added rate control -Could use IV amiodarone for added rate control  -Would benefit from better rate control given his cardiomyopathy  -Not on long term anticoagulation, per his choice upon reading prior notes -CHADSVASc at least 2 (CHF, DM), giving him an estimated annual stroke risk of 2.2% -He has previously been told of the risks of not being on full dose anticoagulation, he is intubated currently and I am unable to do so at this time -Aspirin  3. Acute on chronic systolic CHF: -Unable to continue diuresis at this time given his significant hypotension  -Consider lasix gtt for diuresis given his hypotension and renal injury, when able -Check echo to evaluate LV function and right right side pressure -When able would be a candidate for Entresto and Toprol vs Coreg   4. Acute renal failure: -Likely secondary to ATN in the setting of hypotension and infection  -Has received NS bolus in the ED -Monitor  5. Hyperkalemia: -Status post Kayexalate  6. Bilateral lower extremity cellulitis: -ABX per IM  7. Coagulopathy: -Not on anticoagulation -Poor prognosis  ---Overall, high risk of cardiopulmonary death    Melvern Banker, PA-C Pager: [phone] 06/11/2015, 11:03 AM

## 2015-06-11 NOTE — Care Management (Signed)
RNCM consult received. No one but patient in ICU room when I rounded. Patient is vented and sedated. I'm told in progression that patient "was found by friends and that patient has no family". Friends agreed to placing him on vent. Palliative consult pending for liver disease. CT of head pending; Neuro assessments. CSW consult also placed. RNCM will continue to follow in hopes patient will be able to communicate eventually. No health insurance listed.

## 2015-06-11 NOTE — Progress Notes (Signed)
Dr. Mortimer Fries rounded on patient and new orders received. Propofol discontinued per order. Neo-synephrine gtt decreased to 30 and MAP is 76.

## 2015-06-11 NOTE — Consult Note (Signed)
PULMONARY / CRITICAL CARE MEDICINE   Name: Aaron Harrison MRN: [id number] DOB: [birth year]    ADMISSION DATE:  06/10/2015   CHIEF COMPLAINT:   Acute resp failure   HISTORY OF PRESENT ILLNESS   64 yo white male with multiple medical issues, admitted to ICU for acute resp failure, patient supposeldy found with acute mental status changes and ill appearing, patient was seen in ER found to be hypoxic and with afib, patient with known h/o CHF  Patient was inuabted,sedated, placed on full vent support, fio2 at 50%   SIGNIFICANT EVENTS   Intubated 8/14    PAST MEDICAL HISTORY    :  Past Medical History  Diagnosis Date  . Dysrhythmia   . Pneumonia 2011  . CHF (congestive heart failure)   . Pneumonia 2011  . Cellulitis    History reviewed. No pertinent past surgical history. Prior to Admission medications   Medication Sig Start Date End Date Taking? Authorizing Provider  aspirin EC 81 MG tablet Take 81 mg by mouth daily.    Historical Provider, MD  furosemide (LASIX) 20 MG tablet Take 1 tablet (20 mg total) by mouth daily. Patient not taking: Reported on 05/11/2015 04/11/15   Max Sane, MD   Allergies  Allergen Reactions  . Penicillins Nausea And Vomiting     FAMILY HISTORY   Family History  Problem Relation Age of Onset  . Diabetes Mother   . Stroke Mother   . Heart failure Father       SOCIAL HISTORY    reports that he has never smoked. He has never used smokeless tobacco. He reports that he does not drink alcohol or use illicit drugs.  Review of Systems  Unable to perform ROS: critical illness      VITAL SIGNS    Temp:  [95.7 F (35.4 C)-98.6 F (37 C)] 95.7 F (35.4 C) (08/15 0600) Pulse Rate:  [18-142] 87 (08/15 0700) Resp:  [10-34] 16 (08/15 0700) BP: (68-175)/(49-153) 68/49 mmHg (08/15 0700) SpO2:  [66 %-100 %] 96 % (08/15 0700) FiO2 (%):  [50 %-60 %] 50 % (08/15 0450) Weight:  [273 lb 13 oz (124.2 kg)-275 lb 14.4 oz (125.147 kg)] 273 lb 13  oz (124.2 kg) (08/15 0432) HEMODYNAMICS:   VENTILATOR SETTINGS: Vent Mode:  [-] PRVC FiO2 (%):  [50 %-60 %] 50 % Set Rate:  [15 bmp-16 bmp] 16 bmp Vt Set:  [500 mL] 500 mL PEEP:  [5 cmH20] 5 cmH20 INTAKE / OUTPUT:  Intake/Output Summary (Last 24 hours) at 06/11/15 0850 Last data filed at 06/11/15 0746  Gross per 24 hour  Intake 1841.27 ml  Output    310 ml  Net 1531.27 ml       PHYSICAL EXAM   Physical Exam  Constitutional: He appears distressed.  HENT:  Head: Normocephalic and atraumatic.  Eyes: Pupils are equal, round, and reactive to light. No scleral icterus.  Neck: Normal range of motion. Neck supple.  Cardiovascular: Normal rate and regular rhythm.   No murmur heard. Pulmonary/Chest: He is in respiratory distress. He has no wheezes. He has rales.  resp distress  Abdominal: Soft. He exhibits no distension. There is no tenderness.  Musculoskeletal: He exhibits no edema.  Neurological: He displays normal reflexes. Coordination normal.  gcs<8T  Skin: Skin is warm. No rash noted. He is diaphoretic.       LABS   LABS:  CBC  Recent Labs Lab 06/10/15 1435  WBC 11.1*  HGB 13.4  HCT 42.0  PLT 155   Coag's  Recent Labs Lab 06/10/15 1435  APTT 32  INR 2.00   BMET  Recent Labs Lab 06/10/15 1435  NA 138  K 5.5*  CL 103  CO2 26  BUN 48*  CREATININE 2.46*  GLUCOSE 112*   Electrolytes  Recent Labs Lab 06/10/15 1435  CALCIUM 8.7*   Sepsis Markers  Recent Labs Lab 06/10/15 1435 06/10/15 1837  LATICACIDVEN 2.1* 4.0*   ABG  Recent Labs Lab 06/10/15 1640 06/10/15 1930  PHART 7.13* 7.29*  PCO2ART 69* 45  PO2ART 69* 185*   Liver Enzymes  Recent Labs Lab 06/10/15 1435  AST 301*  ALT 150*  ALKPHOS 87  BILITOT 6.7*  ALBUMIN 3.5   Cardiac Enzymes  Recent Labs Lab 06/10/15 1435 06/11/15 0040 06/11/15 0312  TROPONINI 0.20* 0.22* 0.27*   Glucose No results for input(s): GLUCAP in the last 168 hours.   Recent  Results (from the past 240 hour(s))  Blood Culture (routine x 2)     Status: None (Preliminary result)   Collection Time: 06/10/15  2:35 PM  Result Value Ref Range Status   Specimen Description BLOOD RIGHT WRIST  Final   Special Requests BOTTLES DRAWN AEROBIC AND ANAEROBIC  1CC  Final   Culture NO GROWTH < 24 HOURS  Final   Report Status PENDING  Incomplete  Blood Culture (routine x 2)     Status: None (Preliminary result)   Collection Time: 06/10/15  2:40 PM  Result Value Ref Range Status   Specimen Description BLOOD LEFT ASSIST CONTROL  Final   Special Requests   Final    BOTTLES DRAWN AEROBIC AND ANAEROBIC  AER 6CC ANA 4CC   Culture NO GROWTH < 24 HOURS  Final   Report Status PENDING  Incomplete  MRSA PCR Screening     Status: None   Collection Time: 06/10/15  6:27 PM  Result Value Ref Range Status   MRSA by PCR NEGATIVE NEGATIVE Final    Comment:        The GeneXpert MRSA Assay (FDA approved for NASAL specimens only), is one component of a comprehensive MRSA colonization surveillance program. It is not intended to diagnose MRSA infection nor to guide or monitor treatment for MRSA infections.      Current facility-administered medications:  .  0.9 %  sodium chloride infusion, , Intravenous, Continuous, Demetrios Loll, MD, Last Rate: 75 mL/hr at 06/11/15 0252, 75 mL/hr at 06/11/15 0252 .  antiseptic oral rinse solution (CORINZ), 7 mL, Mouth Rinse, QID, Demetrios Loll, MD, 7 mL at 06/11/15 0400 .  aspirin chewable tablet 81 mg, 81 mg, Per Tube, Daily, Demetrios Loll, MD, 81 mg at 06/10/15 2128 .  chlorhexidine gluconate (PERIDEX) 0.12 % solution 15 mL, 15 mL, Mouth Rinse, BID, Demetrios Loll, MD, 15 mL at 06/10/15 2107 .  furosemide (LASIX) injection 40 mg, 40 mg, Intravenous, 3 times per day, Demetrios Loll, MD, 40 mg at 06/11/15 0547 .  heparin injection 5,000 Units, 5,000 Units, Subcutaneous, 3 times per day, Demetrios Loll, MD, 5,000 Units at 06/11/15 0547 .  levofloxacin (LEVAQUIN) IVPB 750 mg, 750  mg, Intravenous, Q48H, Demetrios Loll, MD, 750 mg at 06/10/15 2118 .  phenylephrine (NEO-SYNEPHRINE) 10 mg in dextrose 5 % 250 mL (0.04 mg/mL) infusion, 0-400 mcg/min, Intravenous, Titrated, Lytle Butte, MD, Last Rate: 30 mL/hr at 06/11/15 0746, 20 mcg/min at 06/11/15 0746 .  piperacillin-tazobactam (ZOSYN) IVPB 4.5 g, 4.5 g, Intravenous, 3 times per day, Demetrios Loll, MD,  4.5 g at 06/11/15 0746 .  propofol (DIPRIVAN) 1000 MG/100ML infusion, 5-80 mcg/kg/min, Intravenous, Titrated, Demetrios Loll, MD, Last Rate: 22.5 mL/hr at 06/11/15 0626, 30 mcg/kg/min at 06/11/15 0626 .  rocuronium (ZEMURON) injection, , Intravenous, PRN, Hinda Kehr, MD, 100 mg at 06/10/15 1531 .  vancomycin (VANCOCIN) 1,500 mg in sodium chloride 0.9 % 500 mL IVPB, 1,500 mg, Intravenous, Q24H, Demetrios Loll, MD  IMAGING    Dg Abd 1 View  06/10/2015   CLINICAL DATA:  64 year old male enteric tube placement. Initial encounter.  EXAM: ABDOMEN - 1 VIEW  COMPARISON:  Portable chest radiograph 1556 hours today.  FINDINGS: Portable AP supine view at 1552 hours. There is no enteric tube visible in the abdomen. There is gaseous distension of the stomach. Non obstructed bowel gas pattern otherwise. On the recent portable chest tubing was looped over the lower neck.  IMPRESSION: 1. Strongly suspect the enteric tube is looped in the pharynx, visible only on the portable chest radiograph from 1556 hours. Recommend removal and replacement. 2. Gaseous distension of the stomach.   Electronically Signed   By: Genevie Ann M.D.   On: 06/10/2015 16:32   Dg Chest Port 1 View  06/11/2015   CLINICAL DATA:  Central line placement.  Initial encounter.  EXAM: PORTABLE CHEST - 1 VIEW  COMPARISON:  Chest radiograph performed 06/10/2015  FINDINGS: The patient's endotracheal tube is seen ending 3 cm above the carina. A left subclavian line is noted ending about the distal SVC.  A small left pleural effusion is noted. Right upper lobe airspace opacification is concerning for  pneumonia, similar in appearance to the recent prior study. Underlying vascular congestion is noted. No pneumothorax is seen  The cardiomediastinal silhouette is enlarged. No acute osseous abnormalities are identified.  IMPRESSION: 1. Endotracheal tube seen ending 3 cm above the carina. 2. Left subclavian line noted ending about the distal SVC. 3. Right upper lobe airspace opacification, compatible with pneumonia, similar in appearance to the prior study. 4. Small left pleural effusion noted. Cardiomegaly and vascular congestion.   Electronically Signed   By: Garald Balding M.D.   On: 06/11/2015 02:08   Dg Chest Port 1 View  06/10/2015   CLINICAL DATA:  Status post intubation  EXAM: PORTABLE CHEST - 1 VIEW  COMPARISON:  06/10/2015  FINDINGS: Cardiac shadow remains enlarged. A nasogastric catheter and endotracheal tube are again seen. The endotracheal tube is been withdrawn and now lies in satisfactory position. Increasing consolidation within the right upper lobe is noted. Some patchy changes are noted in the left lung base as well. The left changes are stable from the recent exam.  IMPRESSION: Increasing right upper lobe consolidation.   Electronically Signed   By: Inez Catalina M.D.   On: 06/10/2015 19:05   Dg Chest Portable 1 View  06/10/2015   ADDENDUM REPORT: 06/10/2015 16:41  ADDENDUM: Study discussed by telephone with Dr. Hinda Kehr on 06/10/2015 at 1636 hours.  We also discussed that the enteric tube appears to be looped in the pharynx/neck on this image.  He advises that both the ET tube and enteric tube have been repositioned, and a repeat abdominal film is pending.   Electronically Signed   By: Genevie Ann M.D.   On: 06/10/2015 16:41   06/10/2015   CLINICAL DATA:  64 year old male intubated. Tachypnea. Initial encounter.  EXAM: PORTABLE CHEST - 1 VIEW  COMPARISON:  1442 hours today, and earlier  FINDINGS: Portable AP supine view at 1556 hours. Endotracheal  tube tip at the carina. Decreased ventilation  at the left lung base. Stable cardiomegaly and mediastinal contours. Mild crowding of markings elsewhere.  IMPRESSION: 1. Intubated, endotracheal tube tip at the carina. Retract up to 3 cm 4 Mar I optimal placement. 2. Left greater than right perihilar increased atelectasis.  Electronically Signed: By: Genevie Ann M.D. On: 06/10/2015 16:31   Dg Chest Port 1 View  06/10/2015   CLINICAL DATA:  Tachypnea, shallow breathing.  EXAM: PORTABLE CHEST - 1 VIEW  COMPARISON:  April 08, 2015.  FINDINGS: Stable cardiomegaly. No pneumothorax is noted. Increased central pulmonary vascular congestion is noted. Mild bibasilar opacities are noted concerning for subsegmental atelectasis, edema or effusions. Increased right upper lobe opacity is noted concerning for possible pneumonia or edema. Bony thorax is intact.  IMPRESSION: Cardiomegaly and increased central pulmonary vascular congestion is noted, with increased right upper lobe opacity concerning for pneumonia or possibly edema. Mild bibasilar opacities are noted concerning for subsegmental atelectasis or edema with possible associated minimal pleural effusions.   Electronically Signed   By: Marijo Conception, M.D.   On: 06/10/2015 15:08   Dg Abd Portable 1v  06/10/2015   CLINICAL DATA:  OG tube placement.  EXAM: PORTABLE ABDOMEN - 1 VIEW 4:31 p.m.  COMPARISON:  06/10/2015 3:52 p.m.  FINDINGS: There is an OG tube with tip in the fundus of the stomach. Endotracheal tube appears in good position 3 cm above the carina.  No visible dilated bowel. Pulmonary infiltrates noted in the right upper lobe and left lower lobe.  IMPRESSION: OG tube tip in the fundus of the stomach.   Electronically Signed   By: Lorriane Shire M.D.   On: 06/10/2015 16:51      Indwelling Urinary Catheter continued, requirement due to   Reason to continue Indwelling Urinary Catheter for strict Intake/Output monitoring for hemodynamic instability   Central Line continued, requirement due to   Reason to  continue [employer] Monitoring of central venous pressure or other hemodynamic parameters   Ventilator continued, requirement due to, resp failure    Ventilator Sedation RASS 0 to -2   MAJOR EVENTS/TEST RESULTS: 8/14 intubated   INDWELLING DEVICES::LT subclavian vein 8/14 MICRO DATA: MRSA PCR>> NEG 8/14 Urine  Blood Resp   ANTIMICROBIALS: Levaquin    ASSESSMENT/PLAN   64 yo white male admitted to ICU for acute resp hypercapnic adn hypoxic resp failure from acute CHF exacerbation with acute liver dysfunction/liver failure with acute renal failure with acute encephalopathy with mixed resp and metabolic acidosis  PULMONARY 1.Respiratory Failure -continue Full MV support -continue Bronchodilator Therapy -Wean Fio2 and PEEP as tolerated -will perform SAT/SBt when respiratory parameters are met   CARDIOVASCULAR ACUTE CHF -lasic as tolerated  RENAL Renal Failure-most likely due to ATN -follow chem 7 -follow UO -continue Foley Catheter-assess need   GASTROINTESTINAL GI Prophylaxic -start TF's  HEMATOLOGIC Follow h/h  INFECTIOUS -no obviuos source of infection at this time ?need for ABX  ENDOCRINE - ICU hypoglycemic\Hyperglycemia protocol   NEUROLOGIC - intubated and sedated - minimal sedation to achieve a RASS goal: -1     I have personally obtained a history, examined the patient, evaluated laboratory and independently reviewed  imaging results, formulated the assessment and plan and placed orders.  The Patient requires high complexity decision making for assessment and support, frequent evaluation and titration of therapies, application of advanced monitoring technologies and extensive interpretation of multiple databases. Critical Care Time devoted to patient care services described in this  note is 50 minutes.   Overall, patient is critically ill, prognosis is guarded. Patient at high risk for cardiac arrest and death.  Patient with multiorgan  failure-liver, cardiac,resp,renal.   Corrin Parker, M.D.  [facility]  Medical Director [place] Director [place] Department

## 2015-06-12 ENCOUNTER — Inpatient Hospital Stay: Payer: Medicaid Other

## 2015-06-12 ENCOUNTER — Encounter: Payer: Self-pay | Admitting: *Deleted

## 2015-06-12 LAB — GLUCOSE, CAPILLARY
GLUCOSE-CAPILLARY: 105 mg/dL — AB (ref 65–99)
GLUCOSE-CAPILLARY: 105 mg/dL — AB (ref 65–99)
GLUCOSE-CAPILLARY: 115 mg/dL — AB (ref 65–99)
GLUCOSE-CAPILLARY: 94 mg/dL (ref 65–99)
Glucose-Capillary: 98 mg/dL (ref 65–99)
Glucose-Capillary: 95 mg/dL (ref 65–99)

## 2015-06-12 LAB — URINE CULTURE

## 2015-06-12 LAB — CBC
HEMATOCRIT: 40 % (ref 40.0–52.0)
HEMOGLOBIN: 12.9 g/dL — AB (ref 13.0–18.0)
MCH: 34.1 pg — ABNORMAL HIGH (ref 26.0–34.0)
MCHC: 32.1 g/dL (ref 32.0–36.0)
MCV: 105.9 fL — AB (ref 80.0–100.0)
Platelets: 98 10*3/uL — ABNORMAL LOW (ref 150–440)
RBC: 3.78 MIL/uL — ABNORMAL LOW (ref 4.40–5.90)
RDW: 17.5 % — AB (ref 11.5–14.5)
WBC: 10.6 10*3/uL (ref 3.8–10.6)

## 2015-06-12 LAB — HEPARIN LEVEL (UNFRACTIONATED)
HEPARIN UNFRACTIONATED: 0.14 [IU]/mL — AB (ref 0.30–0.70)
HEPARIN UNFRACTIONATED: 0.2 [IU]/mL — AB (ref 0.30–0.70)

## 2015-06-12 MED ORDER — FLEET ENEMA 7-19 GM/118ML RE ENEM
1.0000 | ENEMA | Freq: Once | RECTAL | Status: AC
  Administered 2015-06-12: 1 via RECTAL

## 2015-06-12 MED ORDER — BUDESONIDE 0.5 MG/2ML IN SUSP
0.5000 mg | Freq: Two times a day (BID) | RESPIRATORY_TRACT | Status: DC
  Administered 2015-06-12 – 2015-06-20 (×15): 0.5 mg via RESPIRATORY_TRACT
  Filled 2015-06-12 (×15): qty 2

## 2015-06-12 MED ORDER — IPRATROPIUM-ALBUTEROL 0.5-2.5 (3) MG/3ML IN SOLN
3.0000 mL | Freq: Four times a day (QID) | RESPIRATORY_TRACT | Status: DC
  Administered 2015-06-12 – 2015-06-23 (×44): 3 mL via RESPIRATORY_TRACT
  Filled 2015-06-12 (×44): qty 3

## 2015-06-12 MED ORDER — HEPARIN BOLUS VIA INFUSION
2900.0000 [IU] | Freq: Once | INTRAVENOUS | Status: AC
  Administered 2015-06-12: 2900 [IU] via INTRAVENOUS
  Filled 2015-06-12: qty 2900

## 2015-06-12 MED ORDER — AMPICILLIN-SULBACTAM SODIUM 3 (2-1) G IJ SOLR
3.0000 g | Freq: Four times a day (QID) | INTRAMUSCULAR | Status: DC
  Administered 2015-06-12 – 2015-06-16 (×15): 3 g via INTRAVENOUS
  Filled 2015-06-12 (×21): qty 3

## 2015-06-12 MED ORDER — HEPARIN BOLUS VIA INFUSION
1450.0000 [IU] | Freq: Once | INTRAVENOUS | Status: AC
  Administered 2015-06-12: 1450 [IU] via INTRAVENOUS
  Filled 2015-06-12: qty 1450

## 2015-06-12 MED ORDER — IPRATROPIUM-ALBUTEROL 0.5-2.5 (3) MG/3ML IN SOLN
3.0000 mL | RESPIRATORY_TRACT | Status: DC
  Filled 2015-06-12: qty 3

## 2015-06-12 NOTE — Progress Notes (Signed)
Paged Dr Manuella Ghazi regarding patient urine output. Received report at 11:00am and I have only had so far 80ml of urine output. Will request nephrology consult. Placed back on bipap patient is not as alert as he was this am.

## 2015-06-12 NOTE — Progress Notes (Signed)
Speech Therapy Note: received new order, reviewed chart notes. Pt has been placed back on BiPAP to support his respiratory status, less alert than this AM. ST will f/u w/ BSE when appropriate.

## 2015-06-12 NOTE — Consult Note (Signed)
ANTICOAGULATION CONSULT NOTE - Initial Consult  Pharmacy Consult for heparin Indication: atrial fibrillation  Allergies  Allergen Reactions  . Penicillins Nausea And Vomiting    Patient Measurements: Height: 5\' 8"  (172.7 cm) Weight: 273 lb 13 oz (124.2 kg) IBW/kg (Calculated) : 68.4 Heparin Dosing Weight: 97.4  Vital Signs: Temp: 97.2 F (36.2 C) (08/16 0000) BP: 112/58 mmHg (08/16 0000) Pulse Rate: 93 (08/16 0000)  Labs:  Recent Labs  06/10/15 1435 06/11/15 0040 06/11/15 0312 06/11/15 1519 06/12/15 0300  HGB 13.4  --   --   --   --   HCT 42.0  --   --   --   --   PLT 155  --   --   --   --   APTT 32  --   --   --   --   LABPROT 22.8*  --   --   --   --   INR 2.00  --   --   --   --   HEPARINUNFRC  --   --   --   --  0.14*  CREATININE 2.46*  --   --  2.64*  --   TROPONINI 0.20* 0.22* 0.27*  --   --     Estimated Creatinine Clearance: 37.2 mL/min (by C-G formula based on Cr of 2.64).   Medical History: Past Medical History  Diagnosis Date  . Chronic atrial fibrillation     a. not on long term anticoagulation  . Pneumonia 2011  . Chronic systolic CHF (congestive heart failure)   . Cellulitis     Medications:  Scheduled:  . antiseptic oral rinse  7 mL Mouth Rinse QID  . aspirin  81 mg Per Tube Daily  . chlorhexidine gluconate  15 mL Mouth Rinse BID  . feeding supplement (VITAL HIGH PROTEIN)  1,000 mL Per Tube Q24H  . furosemide      . furosemide      . heparin  2,900 Units Intravenous Once  . lactulose  30 g Oral BID  . piperacillin-tazobactam (ZOSYN)  IV  4.5 g Intravenous 3 times per day    Assessment: Pt is a 64 year old male in Afib Goal of Therapy:  Heparin level 0.3-0.7 units/ml Monitor platelets by anticoagulation protocol: Yes   Plan:   Heparin level returned at 0.14. Ordered 30 unit/kg bolus x 1 (2900 units), increased rate 4 units/kg/hr (to 17.5 mL/hr) and repeat heparin level at 1000.    Laural Benes, Pharm.D. Clinical  Pharmacist 06/12/2015,3:38 AM

## 2015-06-12 NOTE — Progress Notes (Signed)
PROGRESS NOTE  Subjective:    64 yo with chronic systolic CHF, atrial fib, noncompliance Admitted with HCAP sepsis, cellulitis and worsening afib with RVR.  He is now on the vent, sedated.   On pressors for hypotension   Objective:    Vital Signs:   Temp:  [96.3 F (35.7 C)-98.4 F (36.9 C)] 96.3 F (35.7 C) (08/16 0600) Pulse Rate:  [72-137] 93 (08/16 0759) Resp:  [16-42] 23 (08/16 0759) BP: (79-141)/(53-127) 112/63 mmHg (08/16 0600) SpO2:  [90 %-100 %] 100 % (08/16 0759) FiO2 (%):  [28 %-50 %] 40 % (08/15 2029) Weight:  [124.2 kg (273 lb 13 oz)] 124.2 kg (273 lb 13 oz) (08/16 0357)  Last BM Date: 06/11/15   24-hour weight change: Weight change: -0.947 kg (-2 lb 1.4 oz)  Weight trends: Filed Weights   06/10/15 1422 06/11/15 0432 06/12/15 0357  Weight: 125.147 kg (275 lb 14.4 oz) 124.2 kg (273 lb 13 oz) 124.2 kg (273 lb 13 oz)    Intake/Output:  08/15 0701 - 08/16 0700 In: 1423.6 [I.V.:1223.6; IV Piggyback:200] Out: 950 [Urine:950]     Physical Exam: BP 112/63 mmHg  Pulse 93  Temp(Src) 96.3 F (35.7 C) (Oral)  Resp 23  Ht 5\' 8"  (1.727 m)  Wt 124.2 kg (273 lb 13 oz)  BMI 41.64 kg/m2  SpO2 100%  Wt Readings from Last 3 Encounters:  06/12/15 124.2 kg (273 lb 13 oz)  05/11/15 119.296 kg (263 lb)  04/11/15 103.602 kg (228 lb 6.4 oz)    General: Vital signs reviewed and noted.   Head: Normocephalic, atraumatic.  Eyes: conjunctivae/corneas clear.  EOM's intact.   Throat: normal  Neck:  normal   Lungs:    rhonchi, on the vent   Heart:  IRREg. Irreg. tachy  Abdomen:  Soft, non-tender, non-distended  Non tender   Extremities: No edema    Neurologic: Sedated   Psych: Sedated     Labs: BMET:  Recent Labs  06/10/15 1435 06/11/15 1519  NA 138 138  K 5.5* 4.8  CL 103 106  CO2 26 24  GLUCOSE 112* 109*  BUN 48* 55*  CREATININE 2.46* 2.64*  CALCIUM 8.7* 7.9*    Liver function tests:  Recent Labs  06/10/15 1435 06/11/15 1519  AST  301* 108*  ALT 150* 112*  ALKPHOS 87 68  BILITOT 6.7* 5.4*  PROT 7.2 6.0*  ALBUMIN 3.5 2.8*    Recent Labs  06/10/15 1435  LIPASE 20*    CBC:  Recent Labs  06/10/15 1435 06/12/15 0300  WBC 11.1* 10.6  NEUTROABS 9.8*  --   HGB 13.4 12.9*  HCT 42.0 40.0  MCV 105.2* 105.9*  PLT 155 98*    Cardiac Enzymes:  Recent Labs  06/09/15 2025 06/10/15 1435 06/11/15 0040 06/11/15 0312  TROPONINI 0.26* 0.20* 0.22* 0.27*    Coagulation Studies:  Recent Labs  06/10/15 1435  LABPROT 22.8*  INR 2.00    Other: Invalid input(s): POCBNP No results for input(s): DDIMER in the last 72 hours. No results for input(s): HGBA1C in the last 72 hours.  Recent Labs  06/10/15 2005  TRIG 68   No results for input(s): TSH, T4TOTAL, T3FREE, THYROIDAB in the last 72 hours.  Invalid input(s): FREET3 No results for input(s): VITAMINB12, FOLATE, FERRITIN, TIBC, IRON, RETICCTPCT in the last 72 hours.   Other results:  Tele   ( personally reviewed )  - atrial fib with RVR    Medications:  Infusions: . sodium chloride 75 mL/hr at 06/11/15 1430  . amiodarone 30 mg/hr (06/12/15 0225)  . heparin 1,750 Units/hr (06/12/15 0706)    Scheduled Medications: . antiseptic oral rinse  7 mL Mouth Rinse QID  . aspirin  81 mg Per Tube Daily  . chlorhexidine gluconate  15 mL Mouth Rinse BID  . lactulose  30 g Oral BID  . piperacillin-tazobactam (ZOSYN)  IV  4.5 g Intravenous 3 times per day    Assessment/ Plan:   Principal Problem:   Acute respiratory failure Active Problems:   Community acquired pneumonia   Acute renal failure   Acute CHF   Hyperkalemia   Ascites   Bilateral lower leg cellulitis   Atrial fibrillation   Sepsis   Coagulopathy   Acute respiratory failure with hypoxemia   Multi-organ failure with liver failure  1. Atrial fib:  Rate remains elevated.  Continue amio drip. He has not been compliant with meds in the past continue rate control  2. Chronic  systolic CHF:  May be related to long term afib with RVr.   3. Multiorgan failure - has respiratory failure, worsening renal function, HCAPj. Further eval and management per Int. And CCM.   Disposition:  Length of Stay: 2  Ramond Dial., MD, [facility] 06/12/2015, 8:19 AM Office [phone] Pager [phone]

## 2015-06-12 NOTE — Consult Note (Signed)
ANTICOAGULATION CONSULT NOTE - Initial Consult  Pharmacy Consult for heparin Indication: atrial fibrillation  Allergies  Allergen Reactions  . Penicillins Nausea And Vomiting    Patient Measurements: Height: 5\' 8"  (172.7 cm) Weight: 273 lb 13 oz (124.2 kg) IBW/kg (Calculated) : 68.4 Heparin Dosing Weight: 97.4  Vital Signs: Temp: 97.3 F (36.3 C) (08/16 1300) Temp Source: Oral (08/16 1300) BP: 99/69 mmHg (08/16 1700) Pulse Rate: 99 (08/16 1700)  Labs:  Recent Labs  06/10/15 1435 06/11/15 0040 06/11/15 0312 06/11/15 1519 06/12/15 0300 06/12/15 1030  HGB 13.4  --   --   --  12.9*  --   HCT 42.0  --   --   --  40.0  --   PLT 155  --   --   --  98*  --   APTT 32  --   --   --   --   --   LABPROT 22.8*  --   --   --   --   --   INR 2.00  --   --   --   --   --   HEPARINUNFRC  --   --   --   --  0.14* 0.20*  CREATININE 2.46*  --   --  2.64*  --   --   TROPONINI 0.20* 0.22* 0.27*  --   --   --     Estimated Creatinine Clearance: 37.2 mL/min (by C-G formula based on Cr of 2.64).   Medical History: Past Medical History  Diagnosis Date  . Chronic atrial fibrillation     a. not on long term anticoagulation  . Pneumonia 2011  . Chronic systolic CHF (congestive heart failure)   . Cellulitis     Medications:  Scheduled:  . ampicillin-sulbactam (UNASYN) IV  3 g Intravenous Q6H  . antiseptic oral rinse  7 mL Mouth Rinse QID  . aspirin  81 mg Per Tube Daily  . budesonide (PULMICORT) nebulizer solution  0.5 mg Nebulization BID  . chlorhexidine gluconate  15 mL Mouth Rinse BID  . ipratropium-albuterol  3 mL Nebulization Q6H  . lactulose  30 g Oral BID    Assessment: Pt is a 64 year old male in Afib Goal of Therapy:  Heparin level 0.3-0.7 units/ml Monitor platelets by anticoagulation protocol: Yes   Plan:   Heparin level returned at 0.20. Will bolus 1450 units, then increased rate 2 units/kg/hr (to 19.5 mL/hr) and repeat heparin level at 0030.    Ramond Dial, Pharm.D. Clinical Pharmacist 06/12/2015,6:12 PM

## 2015-06-12 NOTE — Care Management (Signed)
Per RN during progression rounds patient more alert today. He is now on Bipap. When Bipap weaned to [facility] patient may start PT. Still no friends/family present.

## 2015-06-12 NOTE — Progress Notes (Signed)
[facility] [place] NAME: Aaron Harrison    MR#:  [id number]  DATE OF BIRTH:  06-13-1951  SUBJECTIVE:  CHIEF COMPLAINT:   Chief Complaint  Patient presents with  . Weakness    "not acting right" per family   extubated yesterday, worse BiPAP as he has been very lethargic at times.  Not able to provide much history, very low urine output for more than 24-48 hours now REVIEW OF SYSTEMS:  Review of Systems  Unable to perform ROS: critical illness   DRUG ALLERGIES:   Allergies  Allergen Reactions  . Penicillins Nausea And Vomiting   VITALS:  Blood pressure 100/57, pulse 111, temperature 97.3 F (36.3 C), temperature source Oral, resp. rate 25, height 5\' 8"  (1.727 m), weight 124.2 kg (273 lb 13 oz), SpO2 97 %. PHYSICAL EXAMINATION:  Physical Exam  Constitutional: He is well-developed, well-nourished, and in no distress.  HENT:  Head: Normocephalic and atraumatic.  Eyes: Conjunctivae and EOM are normal. Pupils are equal, round, and reactive to light.  Neck: Normal range of motion. Neck supple. No tracheal deviation present. No thyromegaly present.  Cardiovascular: Normal rate, regular rhythm and normal heart sounds.   Pulmonary/Chest: Effort normal and breath sounds normal. No respiratory distress. He has no wheezes. He exhibits no tenderness.  Abdominal: Soft. Bowel sounds are normal. He exhibits no distension. There is no tenderness.  Musculoskeletal: Normal range of motion.  Neurological: He is disoriented. No cranial nerve deficit.  Lethargic but Follows simple commands  Skin: Skin is warm and dry. No rash noted.  Psychiatric: Mood and affect normal.   LABORATORY PANEL:   CBC  Recent Labs Lab 06/12/15 0300  WBC 10.6  HGB 12.9*  HCT 40.0  PLT 98*   ------------------------------------------------------------------------------------------------------------------ Chemistries   Recent Labs Lab 06/11/15 1519  NA 138   K 4.8  CL 106  CO2 24  GLUCOSE 109*  BUN 55*  CREATININE 2.64*  CALCIUM 7.9*  AST 108*  ALT 112*  ALKPHOS 68  BILITOT 5.4*   RADIOLOGY:  Dg Chest Port 1 View  06/12/2015   CLINICAL DATA:  Shortness of breath.  EXAM: PORTABLE CHEST - 1 VIEW  COMPARISON:  06/11/2015.  FINDINGS: Interim removal of endotracheal tube. Left subclavian line in good anatomic position. Cardiomegaly. Progressive bilateral pulmonary infiltrates. Small left pleural effusion. No pneumothorax.  IMPRESSION: 1. Interim removal of endotracheal tube and NG tube. Left sub plate central line in stable position. 2. Cardiomegaly with pulmonary venous congestion and significant interim progression of bilateral pulmonary infiltrates. Persistent left pleural effusion. Findings are consistent with progressive congestive heart failure. Underlying pneumonia cannot be excluded.   Electronically Signed   By: Marcello Moores  Register   On: 06/12/2015 07:41   ASSESSMENT AND PLAN:   * Acute respiratory failure with hypoxia: intubated in ED, extubated on August 15. requiring BIPAP.  * Sepsis with multiorgan failure: Likely hospital-acquired pneumonia: continue zosyn.  Infectious disease consultation  * Acute on chronic systolic CHF, ejection fraction 35%:  Unable to continue diuresis at this time due to  hypotension  Echo showing ejection fraction 30% to 35%  * A. fib with RVR:  Not able to use rate controlling medication due to hypotension In the past.  He had refused anticoagulation Continue aspirin for now  * Elevated troponin: Due to supply demand ischemia  * Bilateral lower extremity cellulitis: Continue Zosyn  * Sepsis: Continue Zosyn  * Acute renal failure: Also has a  low urine output. Possible cardiorenal syndrome due to underlying CHF.  Consult nephrology  * Hyperkalemia: Resolved  * Ascites/Abnormal liver function test: GI consult  * Coagulopathy: Monitor coags    All the records are reviewed and case discussed  with Care Management/Social Worker. Management plans discussed with the patient, family and they are in agreement.  CODE STATUS: Full code   TOTAL TIME (  critical care ) TAKING CARE OF THIS PATIENT: 35 minutes.   More than 50% of the time was spent in counseling/coordination of care: YES  POSSIBLE D/C IN 2-3 DAYS, DEPENDING ON CLINICAL CONDITION.   [facility], Kiannah Grunow M.D on 06/12/2015 at 5:21 PM  Between 7am to 6pm - Pager - [phone]  After 6pm go to [email or web address] - password [email or web address] [facility]  [facility]  Office  [phone]  CC:  Primary care physician; No PCP Per Patient

## 2015-06-12 NOTE — Progress Notes (Signed)
Nutrition Follow-up   INTERVENTION:   Medical Food Supplement Therapy: pt may benefit from addition of nutritional supplement once diet advanced  NUTRITION DIAGNOSIS:   Inadequate oral intake related to acute illness as evidenced by NPO status.  GOAL:   Patient will meet greater than or equal to 90% of their needs  MONITOR:    (Energy Intake, EN, Digestive System, Electrolyte/Renal Profile, Glucose Profile)  ASSESSMENT:    Pt s/p extubation, on Bipap this AM, lethargic   Diet Order:  Diet NPO time specified  EN: TF ordered yesterday but did not receive as pt was extubated  Electrolyte and Renal Profile:  Recent Labs Lab 06/10/15 1435 06/11/15 1519  BUN 48* 55*  CREATININE 2.46* 2.64*  NA 138 138  K 5.5* 4.8   Glucose Profile:   Recent Labs  06/11/15 2349 06/12/15 0351 06/12/15 0739  GLUCAP 96 105* 98   Nutritional Anemia Profile:  CBC Latest Ref Rng 06/12/2015 06/10/2015 04/11/2015  WBC 3.8 - 10.6 K/uL 10.6 11.1(H) 2.8(L)  Hemoglobin 13.0 - 18.0 g/dL 12.9(L) 13.4 15.2  Hematocrit 40.0 - 52.0 % 40.0 42.0 46.0  Platelets 150 - 440 K/uL 98(L) 155 110(L)   Meds: NS at 75 ml/hr, lactulose  Skin:  Reviewed, no issues  Last BM:  8/16  Height:   Ht Readings from Last 1 Encounters:  06/10/15 5\' 8"  (1.727 m)    Weight:   Wt Readings from Last 1 Encounters:  06/12/15 273 lb 13 oz (124.2 kg)   BMI:  Body mass index is 41.64 kg/(m^2).  Estimated Nutritional Needs:   Kcal:  1750-2170 kcals   Protein:  84-105 g (1.2-1.5 g/kg)   Fluid:  1750-2100 mL (25-30 ml/kg)   HIGH Care Level  Kerman Passey MS, RD, LDN [phone] Pager

## 2015-06-12 NOTE — Consult Note (Signed)
PULMONARY / CRITICAL CARE MEDICINE   Name: Aaron Harrison MRN: [id number] DOB: 1951/01/12    ADMISSION DATE:  06/10/2015   CHIEF COMPLAINT:   Acute resp failure   HISTORY OF PRESENT ILLNESS   Patient extubated yesterday, lethargic, on biPAP high risk for re-intubation   SIGNIFICANT EVENTS   Intubated 8/14 Extubated 8/15    PAST MEDICAL HISTORY    :  Past Medical History  Diagnosis Date  . Chronic atrial fibrillation     a. not on long term anticoagulation  . Pneumonia 2011  . Chronic systolic CHF (congestive heart failure)   . Cellulitis    History reviewed. No pertinent past surgical history. Prior to Admission medications   Medication Sig Start Date End Date Taking? Authorizing Provider  aspirin EC 81 MG tablet Take 81 mg by mouth daily.    Historical Provider, MD  furosemide (LASIX) 20 MG tablet Take 1 tablet (20 mg total) by mouth daily. Patient not taking: Reported on 05/11/2015 04/11/15   Max Sane, MD   Allergies  Allergen Reactions  . Penicillins Nausea And Vomiting     FAMILY HISTORY   Family History  Problem Relation Age of Onset  . Diabetes Mother   . Stroke Mother   . Heart failure Father       SOCIAL HISTORY    reports that he has never smoked. He has never used smokeless tobacco. He reports that he does not drink alcohol or use illicit drugs.  Review of Systems  Unable to perform ROS: critical illness      VITAL SIGNS    Temp:  [96.3 F (35.7 C)-98.4 F (36.9 C)] 96.3 F (35.7 C) (08/16 0600) Pulse Rate:  [72-137] 93 (08/16 0759) Resp:  [16-42] 23 (08/16 0759) BP: (79-141)/(53-127) 112/63 mmHg (08/16 0600) SpO2:  [90 %-100 %] 100 % (08/16 0759) FiO2 (%):  [28 %-50 %] 40 % (08/15 2029) Weight:  [273 lb 13 oz (124.2 kg)] 273 lb 13 oz (124.2 kg) (08/16 0357) HEMODYNAMICS:   VENTILATOR SETTINGS: Vent Mode:  [-] Spontaneous FiO2 (%):  [28 %-50 %] 40 % Set Rate:  [18 bmp] 18 bmp Vt Set:  [500 mL] 500 mL PEEP:  [5 cmH20] 5  cmH20 Pressure Support:  [8 cmH20] 8 cmH20 INTAKE / OUTPUT:  Intake/Output Summary (Last 24 hours) at 06/12/15 0828 Last data filed at 06/12/15 0548  Gross per 24 hour  Intake 1210.13 ml  Output    800 ml  Net 410.13 ml       PHYSICAL EXAM   Physical Exam  Constitutional: He appears distressed.  HENT:  Head: Normocephalic and atraumatic.  Eyes: Pupils are equal, round, and reactive to light. No scleral icterus.  Neck: Normal range of motion. Neck supple.  Cardiovascular: Normal rate and regular rhythm.   No murmur heard. Pulmonary/Chest: He is in respiratory distress. He has no wheezes. He has rales.  resp distress  Abdominal: Soft. He exhibits no distension. There is no tenderness.  Musculoskeletal: He exhibits no edema.  Neurological: He displays normal reflexes. Coordination normal.  Lethargic, intermittently follows commands  Skin: Skin is warm. No rash noted. He is diaphoretic.       LABS   LABS:  CBC  Recent Labs Lab 06/10/15 1435 06/12/15 0300  WBC 11.1* 10.6  HGB 13.4 12.9*  HCT 42.0 40.0  PLT 155 98*   Coag's  Recent Labs Lab 06/10/15 1435  APTT 32  INR 2.00   BMET  Recent Labs  Lab 06/10/15 1435 06/11/15 1519  NA 138 138  K 5.5* 4.8  CL 103 106  CO2 26 24  BUN 48* 55*  CREATININE 2.46* 2.64*  GLUCOSE 112* 109*   Electrolytes  Recent Labs Lab 06/10/15 1435 06/11/15 1519  CALCIUM 8.7* 7.9*   Sepsis Markers  Recent Labs Lab 06/10/15 1435 06/10/15 1837  LATICACIDVEN 2.1* 4.0*   ABG  Recent Labs Lab 06/10/15 1640 06/10/15 1930  PHART 7.13* 7.29*  PCO2ART 69* 45  PO2ART 69* 185*   Liver Enzymes  Recent Labs Lab 06/10/15 1435 06/11/15 1519  AST 301* 108*  ALT 150* 112*  ALKPHOS 87 68  BILITOT 6.7* 5.4*  ALBUMIN 3.5 2.8*   Cardiac Enzymes  Recent Labs Lab 06/10/15 1435 06/11/15 0040 06/11/15 0312  TROPONINI 0.20* 0.22* 0.27*   Glucose  Recent Labs Lab 06/11/15 1742 06/11/15 1956  06/11/15 2349 06/12/15 0351 06/12/15 0739  GLUCAP 92 83 96 105* 98     Recent Results (from the past 240 hour(s))  Blood Culture (routine x 2)     Status: None (Preliminary result)   Collection Time: 06/10/15  2:35 PM  Result Value Ref Range Status   Specimen Description BLOOD RIGHT WRIST  Final   Special Requests BOTTLES DRAWN AEROBIC AND ANAEROBIC  1CC  Final   Culture NO GROWTH 2 DAYS  Final   Report Status PENDING  Incomplete  Blood Culture (routine x 2)     Status: None (Preliminary result)   Collection Time: 06/10/15  2:40 PM  Result Value Ref Range Status   Specimen Description BLOOD LEFT ASSIST CONTROL  Final   Special Requests   Final    BOTTLES DRAWN AEROBIC AND ANAEROBIC  AER 6CC ANA 4CC   Culture NO GROWTH 2 DAYS  Final   Report Status PENDING  Incomplete  Urine culture     Status: None (Preliminary result)   Collection Time: 06/10/15  4:42 PM  Result Value Ref Range Status   Specimen Description URINE, RANDOM  Final   Special Requests NONE  Final   Culture NO GROWTH < 24 HOURS  Final   Report Status PENDING  Incomplete  MRSA PCR Screening     Status: None   Collection Time: 06/10/15  6:27 PM  Result Value Ref Range Status   MRSA by PCR NEGATIVE NEGATIVE Final    Comment:        The GeneXpert MRSA Assay (FDA approved for NASAL specimens only), is one component of a comprehensive MRSA colonization surveillance program. It is not intended to diagnose MRSA infection nor to guide or monitor treatment for MRSA infections.   Culture, sputum-assessment     Status: None (Preliminary result)   Collection Time: 06/11/15  2:50 PM  Result Value Ref Range Status   Specimen Description SPUTUM  Final   Special Requests NONE  Final   Sputum evaluation THIS SPECIMEN IS ACCEPTABLE FOR SPUTUM CULTURE  Final   Report Status PENDING  Incomplete     Current facility-administered medications:  .  0.9 %  sodium chloride infusion, , Intravenous, Continuous, Max Sane, MD,  Last Rate: 75 mL/hr at 06/11/15 1430 .  [COMPLETED] amiodarone (NEXTERONE) 1.8 mg/mL load via infusion 150 mg, 150 mg, Intravenous, Once, 150 mg at 06/11/15 2001 **FOLLOWED BY** [EXPIRED] amiodarone (NEXTERONE PREMIX) 360 MG/200ML (1.8 mg/mL) IV infusion, 60 mg/hr, Intravenous, Continuous, Last Rate: 33.3 mL/hr at 06/11/15 2330, 60 mg/hr at 06/11/15 2330 **FOLLOWED BY** amiodarone (NEXTERONE PREMIX) 360 MG/200ML (1.8 mg/mL) IV  infusion, 30 mg/hr, Intravenous, Continuous, Wellington Hampshire, MD, Last Rate: 16.7 mL/hr at 06/12/15 0225, 30 mg/hr at 06/12/15 0225 .  ammonium lactate (LAC-HYDRIN) 12 % lotion, , Topical, PRN, Florene Glen, MD .  antiseptic oral rinse solution (CORINZ), 7 mL, Mouth Rinse, QID, Demetrios Loll, MD, 7 mL at 06/11/15 1600 .  aspirin chewable tablet 81 mg, 81 mg, Per Tube, Daily, Demetrios Loll, MD, 81 mg at 06/11/15 1029 .  chlorhexidine gluconate (PERIDEX) 0.12 % solution 15 mL, 15 mL, Mouth Rinse, BID, Demetrios Loll, MD, 15 mL at 06/11/15 1028 .  heparin ADULT infusion 100 units/mL (25000 units/250 mL), 1,750 Units/hr, Intravenous, Continuous, Max Sane, MD, Last Rate: 17.5 mL/hr at 06/12/15 0706, 1,750 Units/hr at 06/12/15 0706 .  lactulose (CHRONULAC) 10 GM/15ML solution 30 g, 30 g, Oral, BID, Flora Lipps, MD, 30 g at 06/11/15 1029 .  piperacillin-tazobactam (ZOSYN) IVPB 4.5 g, 4.5 g, Intravenous, 3 times per day, Demetrios Loll, MD, 4.5 g at 06/11/15 2316 .  rocuronium (ZEMURON) injection, , Intravenous, PRN, Hinda Kehr, MD, 100 mg at 06/10/15 1531  IMAGING    Dg Chest Port 1 View  06/12/2015   CLINICAL DATA:  Shortness of breath.  EXAM: PORTABLE CHEST - 1 VIEW  COMPARISON:  06/11/2015.  FINDINGS: Interim removal of endotracheal tube. Left subclavian line in good anatomic position. Cardiomegaly. Progressive bilateral pulmonary infiltrates. Small left pleural effusion. No pneumothorax.  IMPRESSION: 1. Interim removal of endotracheal tube and NG tube. Left sub plate central line in stable  position. 2. Cardiomegaly with pulmonary venous congestion and significant interim progression of bilateral pulmonary infiltrates. Persistent left pleural effusion. Findings are consistent with progressive congestive heart failure. Underlying pneumonia cannot be excluded.   Electronically Signed   By: Marcello Moores  Register   On: 06/12/2015 07:41      Indwelling Urinary Catheter continued, requirement due to   Reason to continue Indwelling Urinary Catheter for strict Intake/Output monitoring for hemodynamic instability   Central Line continued, requirement due to   Reason to continue Central Line Monitoring of central venous pressure or other hemodynamic parameters       MAJOR EVENTS/TEST RESULTS: 8/14 intubated 8/15 extubated   INDWELLING DEVICES::LT subclavian vein 8/14 MICRO DATA: MRSA PCR>> NEG 8/14 Urine  Blood Resp   ANTIMICROBIALS: Levaquin    ASSESSMENT/PLAN   64 yo white male admitted to ICU for acute resp hypercapnic adn hypoxic resp failure from acute CHF exacerbation with acute liver dysfunction/liver failure with acute renal failure with acute encephalopathy with mixed resp and metabolic acidosis  PULMONARY 1.Respiratory Failure-extubated but high risk for intubation -biPAP as needed -continue Bronchodilator Therapy  CARDIOVASCULAR ACUTE CHF -lasix as tolerated  RENAL Renal Failure-most likely due to ATN -follow chem 7 -follow UO -continue Foley Catheter-assess need   GASTROINTESTINAL GI Prophylaxic -NPO status for now  HEMATOLOGIC Follow h/h  INFECTIOUS -no obviuos source of infection at this time ?need for ABX  ENDOCRINE - ICU hypoglycemic\Hyperglycemia protocol   NEUROLOGIC -Lethargic, avoid sedatives     I have personally obtained a history, examined the patient, evaluated laboratory and independently reviewed  imaging results, formulated the assessment and plan and placed orders.  The Patient requires high complexity decision making  for assessment and support, frequent evaluation and titration of therapies, application of advanced monitoring technologies and extensive interpretation of multiple databases. Critical Care Time devoted to patient care services described in this note is 66minutes.   Overall, patient is critically ill, prognosis is guarded. Patient at high  risk for cardiac arrest and death.  Patient with multiorgan failure-liver, cardiac,resp,renal.   Corrin Parker, M.D.  [facility]  Medical Director [place] Director [place] Department

## 2015-06-12 NOTE — Plan of Care (Signed)
Spoke with Dr Mortimer Fries per MD keep patient on BIPAP, no ABG. Monitor urine output for the next 24 hours. No new orders given

## 2015-06-13 ENCOUNTER — Inpatient Hospital Stay: Payer: Medicaid Other

## 2015-06-13 DIAGNOSIS — I480 Paroxysmal atrial fibrillation: Secondary | ICD-10-CM

## 2015-06-13 DIAGNOSIS — L03115 Cellulitis of right lower limb: Secondary | ICD-10-CM

## 2015-06-13 DIAGNOSIS — L03116 Cellulitis of left lower limb: Secondary | ICD-10-CM

## 2015-06-13 LAB — RENAL FUNCTION PANEL
ALBUMIN: 2.5 g/dL — AB (ref 3.5–5.0)
ALBUMIN: 2.5 g/dL — AB (ref 3.5–5.0)
ANION GAP: 11 (ref 5–15)
ANION GAP: 10 (ref 5–15)
Albumin: 2.6 g/dL — ABNORMAL LOW (ref 3.5–5.0)
Albumin: 2.3 g/dL — ABNORMAL LOW (ref 3.5–5.0)
Anion gap: 9 (ref 5–15)
Anion gap: 9 (ref 5–15)
BUN: 71 mg/dL — AB (ref 6–20)
BUN: 65 mg/dL — ABNORMAL HIGH (ref 6–20)
BUN: 58 mg/dL — AB (ref 6–20)
BUN: 50 mg/dL — ABNORMAL HIGH (ref 6–20)
CALCIUM: 7.5 mg/dL — AB (ref 8.9–10.3)
CALCIUM: 7.9 mg/dL — AB (ref 8.9–10.3)
CHLORIDE: 104 mmol/L (ref 101–111)
CHLORIDE: 109 mmol/L (ref 101–111)
CO2: 22 mmol/L (ref 22–32)
CO2: 22 mmol/L (ref 22–32)
CO2: 23 mmol/L (ref 22–32)
CO2: 23 mmol/L (ref 22–32)
CREATININE: 3.26 mg/dL — AB (ref 0.61–1.24)
CREATININE: 2.92 mg/dL — AB (ref 0.61–1.24)
Calcium: 7.6 mg/dL — ABNORMAL LOW (ref 8.9–10.3)
Calcium: 7.5 mg/dL — ABNORMAL LOW (ref 8.9–10.3)
Chloride: 104 mmol/L (ref 101–111)
Chloride: 104 mmol/L (ref 101–111)
Creatinine, Ser: 3.99 mg/dL — ABNORMAL HIGH (ref 0.61–1.24)
Creatinine, Ser: 3.8 mg/dL — ABNORMAL HIGH (ref 0.61–1.24)
GFR calc Af Amer: 17 mL/min — ABNORMAL LOW (ref >60)
GFR calc Af Amer: 22 mL/min — ABNORMAL LOW (ref >60)
GFR calc Af Amer: 25 mL/min — ABNORMAL LOW (ref >60)
GFR calc non Af Amer: 22 mL/min — ABNORMAL LOW (ref >60)
GFR, EST AFRICAN AMERICAN: 18 mL/min — AB (ref >60)
GFR, EST NON AFRICAN AMERICAN: 15 mL/min — AB (ref >60)
GFR, EST NON AFRICAN AMERICAN: 16 mL/min — AB (ref >60)
GFR, EST NON AFRICAN AMERICAN: 19 mL/min — AB (ref >60)
GLUCOSE: 106 mg/dL — AB (ref 65–99)
GLUCOSE: 107 mg/dL — AB (ref 65–99)
Glucose, Bld: 114 mg/dL — ABNORMAL HIGH (ref 65–99)
Glucose, Bld: 108 mg/dL — ABNORMAL HIGH (ref 65–99)
PHOSPHORUS: 6.2 mg/dL — AB (ref 2.5–4.6)
PHOSPHORUS: 4.5 mg/dL (ref 2.5–4.6)
POTASSIUM: 4.2 mmol/L (ref 3.5–5.1)
Phosphorus: 7.4 mg/dL — ABNORMAL HIGH (ref 2.5–4.6)
Phosphorus: 3.4 mg/dL (ref 2.5–4.6)
Potassium: 3.8 mmol/L (ref 3.5–5.1)
Potassium: 3.5 mmol/L (ref 3.5–5.1)
Potassium: 3.5 mmol/L (ref 3.5–5.1)
SODIUM: 136 mmol/L (ref 135–145)
SODIUM: 136 mmol/L (ref 135–145)
SODIUM: 141 mmol/L (ref 135–145)
Sodium: 137 mmol/L (ref 135–145)

## 2015-06-13 LAB — CBC WITH DIFFERENTIAL/PLATELET
BASOS ABS: 0 10*3/uL (ref 0–0.1)
Eosinophils Absolute: 0.1 10*3/uL (ref 0–0.7)
HEMATOCRIT: 38.5 % — AB (ref 40.0–52.0)
Hemoglobin: 12.2 g/dL — ABNORMAL LOW (ref 13.0–18.0)
Lymphocytes Relative: 4 %
Lymphs Abs: 0.3 10*3/uL — ABNORMAL LOW (ref 1.0–3.6)
MCH: 34.1 pg — ABNORMAL HIGH (ref 26.0–34.0)
MCHC: 31.7 g/dL — ABNORMAL LOW (ref 32.0–36.0)
MCV: 107.8 fL — ABNORMAL HIGH (ref 80.0–100.0)
MONO ABS: 0.2 10*3/uL (ref 0.2–1.0)
Monocytes Relative: 2 %
NEUTROS ABS: 7.7 10*3/uL — AB (ref 1.4–6.5)
Neutrophils Relative %: 92 %
PLATELETS: 85 10*3/uL — AB (ref 150–440)
RBC: 3.57 MIL/uL — AB (ref 4.40–5.90)
RDW: 17.6 % — AB (ref 11.5–14.5)
WBC: 8.4 10*3/uL (ref 3.8–10.6)

## 2015-06-13 LAB — MAGNESIUM
MAGNESIUM: 2.2 mg/dL (ref 1.7–2.4)
Magnesium: 2.6 mg/dL — ABNORMAL HIGH (ref 1.7–2.4)
Magnesium: 2.3 mg/dL (ref 1.7–2.4)

## 2015-06-13 LAB — BASIC METABOLIC PANEL
ANION GAP: 8 (ref 5–15)
BUN: 66 mg/dL — ABNORMAL HIGH (ref 6–20)
CALCIUM: 7.8 mg/dL — AB (ref 8.9–10.3)
CO2: 26 mmol/L (ref 22–32)
Chloride: 107 mmol/L (ref 101–111)
Creatinine, Ser: 3.64 mg/dL — ABNORMAL HIGH (ref 0.61–1.24)
GFR, EST AFRICAN AMERICAN: 19 mL/min — AB (ref >60)
GFR, EST NON AFRICAN AMERICAN: 16 mL/min — AB (ref >60)
Glucose, Bld: 119 mg/dL — ABNORMAL HIGH (ref 65–99)
Potassium: 4.6 mmol/L (ref 3.5–5.1)
Sodium: 141 mmol/L (ref 135–145)

## 2015-06-13 LAB — BLOOD GAS, ARTERIAL
ACID-BASE DEFICIT: 5.8 mmol/L — AB (ref 0.0–2.0)
Allens test (pass/fail): POSITIVE — AB
BICARBONATE: 22.5 meq/L (ref 21.0–28.0)
FIO2: 0.4
LHR: 18 {breaths}/min
MECHVT: 500 mL
Mechanical Rate: 18
O2 SAT: 97.7 %
PEEP/CPAP: 5 cmH2O
PH ART: 7.22 — AB (ref 7.350–7.450)
Patient temperature: 37
pCO2 arterial: 55 mmHg — ABNORMAL HIGH (ref 32.0–48.0)
pO2, Arterial: 117 mmHg — ABNORMAL HIGH (ref 83.0–108.0)

## 2015-06-13 LAB — GLUCOSE, CAPILLARY
GLUCOSE-CAPILLARY: 97 mg/dL (ref 65–99)
GLUCOSE-CAPILLARY: 91 mg/dL (ref 65–99)
Glucose-Capillary: 84 mg/dL (ref 65–99)
Glucose-Capillary: 90 mg/dL (ref 65–99)

## 2015-06-13 LAB — HEPARIN LEVEL (UNFRACTIONATED)
HEPARIN UNFRACTIONATED: 0.42 [IU]/mL (ref 0.30–0.70)
HEPARIN UNFRACTIONATED: 0.4 [IU]/mL (ref 0.30–0.70)
HEPARIN UNFRACTIONATED: 0.24 [IU]/mL — AB (ref 0.30–0.70)

## 2015-06-13 LAB — EXPECTORATED SPUTUM ASSESSMENT W GRAM STAIN, RFLX TO RESP C

## 2015-06-13 LAB — EXPECTORATED SPUTUM ASSESSMENT W REFEX TO RESP CULTURE

## 2015-06-13 MED ORDER — FENTANYL CITRATE (PF) 100 MCG/2ML IJ SOLN
INTRAMUSCULAR | Status: AC
  Filled 2015-06-13: qty 4

## 2015-06-13 MED ORDER — FREE WATER
25.0000 mL | Status: DC
  Administered 2015-06-13 – 2015-06-17 (×20): 25 mL

## 2015-06-13 MED ORDER — VECURONIUM BROMIDE 10 MG IV SOLR
10.0000 mg | Freq: Once | INTRAVENOUS | Status: AC
  Administered 2015-06-13: 10 mg via INTRAVENOUS

## 2015-06-13 MED ORDER — FENTANYL CITRATE (PF) 100 MCG/2ML IJ SOLN
100.0000 ug | Freq: Once | INTRAMUSCULAR | Status: AC
  Administered 2015-06-13: 100 ug via INTRAVENOUS

## 2015-06-13 MED ORDER — VITAL HIGH PROTEIN PO LIQD
1000.0000 mL | ORAL | Status: DC
  Administered 2015-06-13 – 2015-06-15 (×3): 1000 mL

## 2015-06-13 MED ORDER — NOREPINEPHRINE BITARTRATE 1 MG/ML IV SOLN
0.0000 ug/min | INTRAVENOUS | Status: DC
  Filled 2015-06-13: qty 4

## 2015-06-13 MED ORDER — MIDAZOLAM HCL 2 MG/2ML IJ SOLN
2.0000 mg | Freq: Once | INTRAMUSCULAR | Status: AC
  Administered 2015-06-13: 2 mg via INTRAVENOUS

## 2015-06-13 MED ORDER — FENTANYL CITRATE (PF) 100 MCG/2ML IJ SOLN
100.0000 ug | Freq: Once | INTRAMUSCULAR | Status: AC
  Administered 2015-06-13: 09:00:00 via INTRAVENOUS

## 2015-06-13 MED ORDER — FENTANYL 2500MCG IN NS 250ML (10MCG/ML) PREMIX INFUSION
10.0000 ug/h | INTRAVENOUS | Status: DC
  Administered 2015-06-13: 10 ug/h via INTRAVENOUS
  Administered 2015-06-14: 250 ug/h via INTRAVENOUS
  Administered 2015-06-14: 10 ug/h via INTRAVENOUS
  Administered 2015-06-14: 300 ug/h via INTRAVENOUS
  Administered 2015-06-14: 250 ug/h via INTRAVENOUS
  Administered 2015-06-15: 25 ug/h via INTRAVENOUS
  Administered 2015-06-15: 10 ug/h via INTRAVENOUS
  Administered 2015-06-16 (×2): 400 ug/h via INTRAVENOUS
  Administered 2015-06-16: 325 ug/h via INTRAVENOUS
  Administered 2015-06-16: 300 ug/h via INTRAVENOUS
  Administered 2015-06-17 – 2015-06-18 (×5): 400 ug/h via INTRAVENOUS
  Filled 2015-06-13 (×15): qty 250

## 2015-06-13 MED ORDER — MIDAZOLAM HCL 2 MG/2ML IJ SOLN
INTRAMUSCULAR | Status: AC
  Administered 2015-06-13: 2 mg via INTRAVENOUS
  Filled 2015-06-13: qty 2

## 2015-06-13 MED ORDER — PRO-STAT SUGAR FREE PO LIQD
30.0000 mL | Freq: Two times a day (BID) | ORAL | Status: DC
  Administered 2015-06-13: 22:00:00 via ORAL
  Administered 2015-06-13 – 2015-06-16 (×6): 30 mL via ORAL

## 2015-06-13 MED ORDER — HEPARIN BOLUS VIA INFUSION
1500.0000 [IU] | Freq: Once | INTRAVENOUS | Status: AC
  Administered 2015-06-13: 1500 [IU] via INTRAVENOUS
  Filled 2015-06-13: qty 1500

## 2015-06-13 MED ORDER — FENTANYL CITRATE (PF) 100 MCG/2ML IJ SOLN
INTRAMUSCULAR | Status: AC
  Administered 2015-06-13: 100 ug via INTRAVENOUS
  Filled 2015-06-13: qty 2

## 2015-06-13 MED ORDER — NOREPINEPHRINE 4 MG/250ML-% IV SOLN
INTRAVENOUS | Status: AC
  Administered 2015-06-13: 5 ug/min via INTRAVENOUS
  Filled 2015-06-13: qty 250

## 2015-06-13 MED ORDER — MIDAZOLAM HCL 2 MG/2ML IJ SOLN
2.0000 mg | Freq: Once | INTRAMUSCULAR | Status: AC
  Administered 2015-06-13: 09:00:00 via INTRAVENOUS

## 2015-06-13 MED ORDER — MIDAZOLAM HCL 2 MG/2ML IJ SOLN
2.0000 mg | INTRAMUSCULAR | Status: DC | PRN
  Administered 2015-06-14 – 2015-06-16 (×8): 2 mg via INTRAVENOUS
  Administered 2015-06-16 – 2015-06-17 (×4): 4 mg via INTRAVENOUS
  Administered 2015-06-17: 2 mg via INTRAVENOUS
  Administered 2015-06-18 (×3): 4 mg via INTRAVENOUS
  Filled 2015-06-13: qty 2
  Filled 2015-06-13 (×2): qty 4
  Filled 2015-06-13: qty 2
  Filled 2015-06-13 (×4): qty 4
  Filled 2015-06-13 (×4): qty 2
  Filled 2015-06-13: qty 4
  Filled 2015-06-13: qty 2
  Filled 2015-06-13: qty 4

## 2015-06-13 MED ORDER — HEPARIN SODIUM (PORCINE) 1000 UNIT/ML DIALYSIS
1000.0000 [IU] | INTRAMUSCULAR | Status: DC | PRN
  Filled 2015-06-13: qty 6

## 2015-06-13 MED ORDER — NOREPINEPHRINE 4 MG/250ML-% IV SOLN
0.0000 ug/min | INTRAVENOUS | Status: DC
  Administered 2015-06-13: 5 ug/min via INTRAVENOUS

## 2015-06-13 MED ORDER — MIDAZOLAM HCL 2 MG/2ML IJ SOLN
INTRAMUSCULAR | Status: AC
  Filled 2015-06-13: qty 4

## 2015-06-13 MED ORDER — PUREFLOW DIALYSIS SOLUTION
INTRAVENOUS | Status: DC
  Administered 2015-06-13 (×2): 2500 mL via INTRAVENOUS_CENTRAL
  Administered 2015-06-13: 22:00:00 via INTRAVENOUS_CENTRAL
  Administered 2015-06-14: 15000 mL via INTRAVENOUS_CENTRAL
  Administered 2015-06-14 (×2): via INTRAVENOUS_CENTRAL
  Administered 2015-06-14: 15000 mL via INTRAVENOUS_CENTRAL
  Administered 2015-06-15 – 2015-06-19 (×18): via INTRAVENOUS_CENTRAL
  Administered 2015-06-19 (×2): 15000 mL via INTRAVENOUS_CENTRAL
  Administered 2015-06-20 – 2015-06-21 (×7): via INTRAVENOUS_CENTRAL

## 2015-06-13 NOTE — Clinical Social Work Note (Signed)
Clinical Social Work Assessment  Patient Details  Name: Aaron Harrison MRN: [id number] Date of Birth: 1951/10/26  Date of referral:  06/11/15               Reason for consult:  Discharge Planning, Facility Placement                Permission sought to share information with:    Permission granted to share information::     Name::        Agency::     Relationship::     Contact Information:     Housing/Transportation Living arrangements for the past 2 months:  Single Family Home Source of Information:  Friend/Neighbor Patient Interpreter Needed:  None Criminal Activity/Legal Involvement Pertinent to Current Situation/Hospitalization:  No - Comment as needed Significant Relationships:  Church, Friend Lives with:  Self Do you feel safe going back to the place where you live?    Need for family participation in patient care:  Yes (Comment)  Care giving concerns:  Pt lives alone and does not have any family.   Social Worker assessment / plan:  CSW spoke to pt's friend [name] and [name]'s mother [name] by phone.  Pt was currently unresponsive.  Pt is a 64 y/o single male who lives alone.  Pt's mother died a couple of years ago and to his friends' knowledge pt does not have other family members.  [name] explained that pt is private and doesn't share a lot of personal information.  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  She explained that [name] stated last week that pt does not need to drive and he just won't listen to me.  Per [name]'s knowledge pt has not lived in a facility or received any home health services.  Employment status:  Unemployed Forensic scientist:  Self Pay (Medicaid Pending) PT Recommendations:  Not assessed at this time Information / Referral to community resources:  [place]  Patient/Family's Response to care:  Pt was unresponsive.  Friends [name] and [name] were pleasant.  Patient/Family's Understanding of and Emotional Response to Diagnosis,  Current Treatment, and Prognosis:  Pt's friends expressed concern about pt's well being.  Pt's friends plan to talk to pt about advanced directives when he becomes medically stable and able.  Emotional Assessment Appearance:  Appears stated age Attitude/Demeanor/Rapport:  Unresponsive Affect (typically observed):  Unable to Assess Orientation:   (unresponsive) Alcohol / Substance use:  Not Applicable Psych involvement (Current and /or in the community):  No (Comment)  Discharge Needs  Concerns to be addressed:  Financial / Insurance Concerns, Discharge Planning Concerns Readmission within the last 30 days:  No Current discharge risk:  Lives alone, Inadequate Financial Supports Barriers to Discharge:  Continued Medical Work up   [place], [place], LCSW 06/13/2015, 1:35 PM

## 2015-06-13 NOTE — Progress Notes (Signed)
Patient: Aaron Harrison / Admit Date: 06/10/2015 / Date of Encounter: 06/13/2015, 8:00 AM   Subjective: Extubated 8/15. On BiPAP. No complaints. Chronic Afib, rate controlled on amio gtt. SCr worsening today, 75 mL urine output over past 24 hours. Protein declining. Nephrology has been consulted. BP remains soft in the 28'U systolic.    Review of Systems: Review of Systems  Constitutional: Positive for malaise/fatigue. Negative for fever, chills and weight loss.  HENT: Negative for congestion.   Eyes: Negative for discharge and redness.  Respiratory: Positive for cough and shortness of breath. Negative for hemoptysis, sputum production and wheezing.   Cardiovascular: Positive for leg swelling. Negative for chest pain, palpitations, orthopnea, claudication and PND.  Musculoskeletal: Negative for falls.  Skin: Negative for rash.       Lower extremity erythema   Neurological: Positive for weakness. Negative for sensory change, speech change and focal weakness.  Endo/Heme/Allergies: Does not bruise/bleed easily.  Psychiatric/Behavioral: The patient is not nervous/anxious.     Objective: Telemetry: Afib upper 90s to low 100s Physical Exam: Blood pressure 96/65, pulse 96, temperature 97.3 F (36.3 C), temperature source Oral, resp. rate 26, height 5\' 8"  (1.727 m), weight 282 lb 3 oz (128 kg), SpO2 94 %. Body mass index is 42.92 kg/(m^2). General: Well developed, well nourished, in no acute distress. Head: Normocephalic, atraumatic, sclera non-icteric, no xanthomas, nares are without discharge. Neck: Negative for carotid bruits. JVP not elevated. Lungs: Coarse breath sounds. Breathing is unlabored on BiPAP.  Heart: Irregularly-irregular, S1 S2 without murmurs, rubs, or gallops.  Abdomen: Obese, soft, non-tender, non-distended with normoactive bowel sounds. No rebound/guarding. Extremities: No clubbing or cyanosis. 2+ bilateral pitting edema with associated erythema.  Neuro: Alert and  oriented X 3. Moves all extremities spontaneously. Psych: Responds to questions appropriately with a normal affect.   Intake/Output Summary (Last 24 hours) at 06/13/15 0800 Last data filed at 06/13/15 0400  Gross per 24 hour  Intake 20714.93 ml  Output     76 ml  Net 20638.93 ml    Inpatient Medications:  . ampicillin-sulbactam (UNASYN) IV  3 g Intravenous Q6H  . antiseptic oral rinse  7 mL Mouth Rinse QID  . aspirin  81 mg Per Tube Daily  . budesonide (PULMICORT) nebulizer solution  0.5 mg Nebulization BID  . chlorhexidine gluconate  15 mL Mouth Rinse BID  . ipratropium-albuterol  3 mL Nebulization Q6H  . lactulose  30 g Oral BID   Infusions:  . sodium chloride 75 mL/hr at 06/13/15 0400  . amiodarone 30 mg/hr (06/12/15 2013)  . heparin 1,950 Units/hr (06/12/15 2059)    Labs:  Recent Labs  06/11/15 1519 06/13/15 0143  NA 138 141  K 4.8 4.6  CL 106 107  CO2 24 26  GLUCOSE 109* 119*  BUN 55* 66*  CREATININE 2.64* 3.64*  CALCIUM 7.9* 7.8*    Recent Labs  06/10/15 1435 06/11/15 1519  AST 301* 108*  ALT 150* 112*  ALKPHOS 87 68  BILITOT 6.7* 5.4*  PROT 7.2 6.0*  ALBUMIN 3.5 2.8*    Recent Labs  06/10/15 1435 06/12/15 0300 06/13/15 0143  WBC 11.1* 10.6 8.4  NEUTROABS 9.8*  --  7.7*  HGB 13.4 12.9* 12.2*  HCT 42.0 40.0 38.5*  MCV 105.2* 105.9* 107.8*  PLT 155 98* 85*    Recent Labs  06/10/15 1435 06/11/15 0040 06/11/15 0312  TROPONINI 0.20* 0.22* 0.27*   Invalid input(s): POCBNP No results for input(s): HGBA1C in the last  72 hours.   Weights: Filed Weights   06/11/15 0432 06/12/15 0357 06/13/15 0359  Weight: 273 lb 13 oz (124.2 kg) 273 lb 13 oz (124.2 kg) 282 lb 3 oz (128 kg)     Radiology/Studies:  Dg Abd 1 View  06/10/2015   CLINICAL DATA:  64 year old male enteric tube placement. Initial encounter.  EXAM: ABDOMEN - 1 VIEW  COMPARISON:  Portable chest radiograph 1556 hours today.  FINDINGS: Portable AP supine view at 1552 hours. There  is no enteric tube visible in the abdomen. There is gaseous distension of the stomach. Non obstructed bowel gas pattern otherwise. On the recent portable chest tubing was looped over the lower neck.  IMPRESSION: 1. Strongly suspect the enteric tube is looped in the pharynx, visible only on the portable chest radiograph from 1556 hours. Recommend removal and replacement. 2. Gaseous distension of the stomach.   Electronically Signed   By: Genevie Ann M.D.   On: 06/10/2015 16:32   Dg Chest Port 1 View  06/12/2015   CLINICAL DATA:  Shortness of breath.  EXAM: PORTABLE CHEST - 1 VIEW  COMPARISON:  06/11/2015.  FINDINGS: Interim removal of endotracheal tube. Left subclavian line in good anatomic position. Cardiomegaly. Progressive bilateral pulmonary infiltrates. Small left pleural effusion. No pneumothorax.  IMPRESSION: 1. Interim removal of endotracheal tube and NG tube. Left sub plate central line in stable position. 2. Cardiomegaly with pulmonary venous congestion and significant interim progression of bilateral pulmonary infiltrates. Persistent left pleural effusion. Findings are consistent with progressive congestive heart failure. Underlying pneumonia cannot be excluded.   Electronically Signed   By: Marcello Moores  Register   On: 06/12/2015 07:41   Dg Chest Port 1 View  06/11/2015   CLINICAL DATA:  Central line placement.  Initial encounter.  EXAM: PORTABLE CHEST - 1 VIEW  COMPARISON:  Chest radiograph performed 06/10/2015  FINDINGS: The patient's endotracheal tube is seen ending 3 cm above the carina. A left subclavian line is noted ending about the distal SVC.  A small left pleural effusion is noted. Right upper lobe airspace opacification is concerning for pneumonia, similar in appearance to the recent prior study. Underlying vascular congestion is noted. No pneumothorax is seen  The cardiomediastinal silhouette is enlarged. No acute osseous abnormalities are identified.  IMPRESSION: 1. Endotracheal tube seen ending  3 cm above the carina. 2. Left subclavian line noted ending about the distal SVC. 3. Right upper lobe airspace opacification, compatible with pneumonia, similar in appearance to the prior study. 4. Small left pleural effusion noted. Cardiomegaly and vascular congestion.   Electronically Signed   By: Garald Balding M.D.   On: 06/11/2015 02:08   Dg Chest Port 1 View  06/10/2015   CLINICAL DATA:  Status post intubation  EXAM: PORTABLE CHEST - 1 VIEW  COMPARISON:  06/10/2015  FINDINGS: Cardiac shadow remains enlarged. A nasogastric catheter and endotracheal tube are again seen. The endotracheal tube is been withdrawn and now lies in satisfactory position. Increasing consolidation within the right upper lobe is noted. Some patchy changes are noted in the left lung base as well. The left changes are stable from the recent exam.  IMPRESSION: Increasing right upper lobe consolidation.   Electronically Signed   By: Inez Catalina M.D.   On: 06/10/2015 19:05   Dg Chest Portable 1 View  06/10/2015   ADDENDUM REPORT: 06/10/2015 16:41  ADDENDUM: Study discussed by telephone with Dr. Hinda Kehr on 06/10/2015 at 1636 hours.  We also discussed that the enteric  tube appears to be looped in the pharynx/neck on this image.  He advises that both the ET tube and enteric tube have been repositioned, and a repeat abdominal film is pending.   Electronically Signed   By: Genevie Ann M.D.   On: 06/10/2015 16:41   06/10/2015   CLINICAL DATA:  64 year old male intubated. Tachypnea. Initial encounter.  EXAM: PORTABLE CHEST - 1 VIEW  COMPARISON:  1442 hours today, and earlier  FINDINGS: Portable AP supine view at 1556 hours. Endotracheal tube tip at the carina. Decreased ventilation at the left lung base. Stable cardiomegaly and mediastinal contours. Mild crowding of markings elsewhere.  IMPRESSION: 1. Intubated, endotracheal tube tip at the carina. Retract up to 3 cm 4 Mar I optimal placement. 2. Left greater than right perihilar increased  atelectasis.  Electronically Signed: By: Genevie Ann M.D. On: 06/10/2015 16:31   Dg Chest Port 1 View  06/10/2015   CLINICAL DATA:  Tachypnea, shallow breathing.  EXAM: PORTABLE CHEST - 1 VIEW  COMPARISON:  April 08, 2015.  FINDINGS: Stable cardiomegaly. No pneumothorax is noted. Increased central pulmonary vascular congestion is noted. Mild bibasilar opacities are noted concerning for subsegmental atelectasis, edema or effusions. Increased right upper lobe opacity is noted concerning for possible pneumonia or edema. Bony thorax is intact.  IMPRESSION: Cardiomegaly and increased central pulmonary vascular congestion is noted, with increased right upper lobe opacity concerning for pneumonia or possibly edema. Mild bibasilar opacities are noted concerning for subsegmental atelectasis or edema with possible associated minimal pleural effusions.   Electronically Signed   By: Marijo Conception, M.D.   On: 06/10/2015 15:08   Dg Abd Portable 1v  06/10/2015   CLINICAL DATA:  OG tube placement.  EXAM: PORTABLE ABDOMEN - 1 VIEW 4:31 p.m.  COMPARISON:  06/10/2015 3:52 p.m.  FINDINGS: There is an OG tube with tip in the fundus of the stomach. Endotracheal tube appears in good position 3 cm above the carina.  No visible dilated bowel. Pulmonary infiltrates noted in the right upper lobe and left lower lobe.  IMPRESSION: OG tube tip in the fundus of the stomach.   Electronically Signed   By: Lorriane Shire M.D.   On: 06/10/2015 16:51     Assessment and Plan   1. Acute respiratory failure with sepsis: -Extubated on 8/15, now on BiPAP -Likely multifactorial in the setting of healthy care associated pneumonia (given his recent hospitalization in June) and likely acute on chronic systolic CHF -Soft blood pressures are precluding IV Lasix at this time -Continue antibiotic, nebs, and steroids treatment per IM   2. Chronic Afib with RVR: -Initially with rates up into the 180s, likely exacerbated by underlying PNA and  cellulitis  -Improved into the low 100's to upper 90s -Hypotension precludes usage of Cardizem or beta blockers at this time -Acute renal failure precludes usage of digoxin at this time for added rate control -Continue use of IV amiodarone for added rate control, unable to taper at this time given continued multiorgan failure  -Would benefit from better rate control given his cardiomyopathy  -Not on long term anticoagulation, per his choice upon reading prior notes -CHADSVASc at least 2 (CHF, DM), giving him an estimated annual stroke risk of 2.2% -Not a long term full dose anticoagulation candidate at this time given his multiorgan failure  -Aspirin  3. Acute on chronic systolic CHF: -Unable to continue diuresis at this time given his significant hypotension  -Consider lasix gtt for diuresis given his hypotension  and renal injury, when able -Check echo to evaluate LV function and right right side pressure -When able would be a candidate for Entresto and Toprol vs Coreg   4. Acute renal failure/possible cardiorenal syndrome: -Nephrology planning for short term dialysis given his worsening renal function and lack of urine output  -Likely secondary to ATN in the setting of hypotension and infection   5. Hyperkalemia: -Status post Kayexalate  6. Bilateral lower extremity cellulitis: -ABX per IM  7. Coagulopathy: -Not on anticoagulation -Poor prognosis  ---Overall, high risk of cardiopulmonary death    Melvern Banker, PA-C Pager: [phone] 06/13/2015, 8:00 AM  Attending Note:   The patient was seen and examined.  Agree with assessment and plan as noted above.  Changes made to the above note as needed.  Pt has multiorgan failure   Continue amiodarone for rate control for his atrial fib  No new suggestions   Thayer Headings, Brooke Bonito., MD, [facility] 06/13/2015, 2:38 PM [address],  [place] Pager [phone]

## 2015-06-13 NOTE — Progress Notes (Signed)
Nutrition Follow-up    INTERVENTION:   EN: recommend starting Vital High Protein at rate of 20 ml/hr with goal rate of 60 ml/hr with Prostat BID providing 157 g of protein, 1640 kcals, 1210 ml of free water. Recommend starting free water flush at 25 mL q 4 hours. Continue to assess. Noted phosphorus elevated, should improve with initiation of CRRT; if not, may need to consider changing to Nepro formula; continue to assess   NUTRITION DIAGNOSIS:   Inadequate oral intake related to acute illness as evidenced by NPO status. Being addressed as initiating TF post extubation  GOAL:   Patient will meet greater than or equal to 90% of their needs   MONITOR:    (Energy Intake, EN, Digestive System, Electrolyte/Renal Profile, Glucose Profile)   ASSESSMENT:    Pt with decline in status requiring reintubation and initiation of CRRT, on levophed (2 mcg/min), OG   Diet Order:  Diet NPO time specified  Skin:  Reviewed, no issues  Last BM:  8/16   Electrolyte and Renal Profile:  Recent Labs Lab 06/11/15 1519 06/13/15 0143 06/13/15 1028  BUN 55* 66* 71*  CREATININE 2.64* 3.64* 3.99*  NA 138 141 137  K 4.8 4.6 4.2  PHOS  --   --  7.4*    Glucose Profile:   Recent Labs  06/12/15 2010 06/12/15 2357 06/13/15 0732  GLUCAP 105* 94 97   Protein Profile:   Recent Labs Lab 06/10/15 1435 06/11/15 1519 06/13/15 1028  ALBUMIN 3.5 2.8* 2.6*   Nutritional Anemia Profile:  CBC Latest Ref Rng 06/13/2015 06/12/2015 06/10/2015  WBC 3.8 - 10.6 K/uL 8.4 10.6 11.1(H)  Hemoglobin 13.0 - 18.0 g/dL 12.2(L) 12.9(L) 13.4  Hematocrit 40.0 - 52.0 % 38.5(L) 40.0 42.0  Platelets 150 - 440 K/uL 85(L) 98(L) 155    Meds: levophed, versed, fentanyl, lactulose, received fleets enema yesterday   Height:   Ht Readings from Last 1 Encounters:  06/10/15 5\' 8"  (1.727 m)    Weight:   Wt Readings from Last 1 Encounters:  06/13/15 282 lb 3 oz (128 kg)   Filed Weights   06/11/15 0432 06/12/15  0357 06/13/15 0359  Weight: 273 lb 13 oz (124.2 kg) 273 lb 13 oz (124.2 kg) 282 lb 3 oz (128 kg)     BMI:  Body mass index is 42.92 kg/(m^2).  Estimated Nutritional Needs:   Kcal:  7026-3785 kcals (11-14 kcals/kg)   Protein:  140-175 g (2.0-2.5 g/kg)   Fluid:  1750-2100 mL (25-30 ml/kg)   HIGH Care Level  Kerman Passey MS, RD, LDN [phone] Pager

## 2015-06-13 NOTE — Progress Notes (Addendum)
Dr. Mortimer Fries placed double lumen temporary vascular access in right groin. CRRT to be started.

## 2015-06-13 NOTE — Progress Notes (Signed)
Spoke with Dr Mortimer Fries per radiologist ET tube needs to be pulled back.

## 2015-06-13 NOTE — Consult Note (Signed)
PULMONARY / CRITICAL CARE MEDICINE   Name: Aaron Harrison MRN: [id number] DOB: 21-Jul-1951    ADMISSION DATE:  06/10/2015   CHIEF COMPLAINT:   Acute resp failure   HISTORY OF PRESENT ILLNESS   Patient with increased WOB and increased lethargy, unresponsive, failed biPAP, decreased UO Patient emergently intubated   SIGNIFICANT EVENTS   Intubated 8/14 Extubated 8/15 Intubated 8/17    PAST MEDICAL HISTORY    :  Past Medical History  Diagnosis Date  . Chronic atrial fibrillation     a. not on long term anticoagulation  . Pneumonia 2011  . Chronic systolic CHF (congestive heart failure)   . Cellulitis    History reviewed. No pertinent past surgical history. Prior to Admission medications   Medication Sig Start Date End Date Taking? Authorizing Provider  aspirin EC 81 MG tablet Take 81 mg by mouth daily.    Historical Provider, MD  furosemide (LASIX) 20 MG tablet Take 1 tablet (20 mg total) by mouth daily. Patient not taking: Reported on 05/11/2015 04/11/15   Max Sane, MD   Allergies  Allergen Reactions  . Penicillins Nausea And Vomiting     FAMILY HISTORY   Family History  Problem Relation Age of Onset  . Diabetes Mother   . Stroke Mother   . Heart failure Father       SOCIAL HISTORY    reports that he has never smoked. He has never used smokeless tobacco. He reports that he does not drink alcohol or use illicit drugs.  Review of Systems  Unable to perform ROS: critical illness      VITAL SIGNS    Temp:  [95.7 F (35.4 C)-97.3 F (36.3 C)] 97.3 F (36.3 C) (08/16 1300) Pulse Rate:  [34-149] 96 (08/17 0700) Resp:  [10-28] 26 (08/17 0700) BP: (83-124)/(57-87) 96/65 mmHg (08/17 0700) SpO2:  [90 %-98 %] 94 % (08/17 0700) FiO2 (%):  [30 %-40 %] 40 % (08/17 0845) Weight:  [282 lb 3 oz (128 kg)] 282 lb 3 oz (128 kg) (08/17 0359) HEMODYNAMICS:   VENTILATOR SETTINGS: Vent Mode:  [-] PRVC FiO2 (%):  [30 %-40 %] 40 % Set Rate:  [15 bmp] 15  bmp Vt Set:  [500 mL] 500 mL PEEP:  [5 cmH20] 5 cmH20 INTAKE / OUTPUT:  Intake/Output Summary (Last 24 hours) at 06/13/15 0949 Last data filed at 06/13/15 0400  Gross per 24 hour  Intake 20714.93 ml  Output     76 ml  Net 20638.93 ml       PHYSICAL EXAM   Physical Exam  Constitutional: He appears distressed.  HENT:  Head: Normocephalic and atraumatic.  Eyes: Pupils are equal, round, and reactive to light. No scleral icterus.  Neck: Normal range of motion. Neck supple.  Cardiovascular: Normal rate and regular rhythm.   No murmur heard. Pulmonary/Chest: He is in respiratory distress. He has no wheezes. He has rales.  resp distress  Abdominal: Soft. He exhibits no distension. There is no tenderness.  Musculoskeletal: He exhibits no edema.  Neurological: He displays normal reflexes. Coordination normal.  Lethargic, GCS8,  Skin: Skin is warm. No rash noted. He is diaphoretic.       LABS   LABS:  CBC  Recent Labs Lab 06/10/15 1435 06/12/15 0300 06/13/15 0143  WBC 11.1* 10.6 8.4  HGB 13.4 12.9* 12.2*  HCT 42.0 40.0 38.5*  PLT 155 98* 85*   Coag's  Recent Labs Lab 06/10/15 1435  APTT 32  INR 2.00  BMET  Recent Labs Lab 06/10/15 1435 06/11/15 1519 06/13/15 0143  NA 138 138 141  K 5.5* 4.8 4.6  CL 103 106 107  CO2 26 24 26   BUN 48* 55* 66*  CREATININE 2.46* 2.64* 3.64*  GLUCOSE 112* 109* 119*   Electrolytes  Recent Labs Lab 06/10/15 1435 06/11/15 1519 06/13/15 0143  CALCIUM 8.7* 7.9* 7.8*   Sepsis Markers  Recent Labs Lab 06/10/15 1435 06/10/15 1837  LATICACIDVEN 2.1* 4.0*   ABG  Recent Labs Lab 06/10/15 1640 06/10/15 1930  PHART 7.13* 7.29*  PCO2ART 69* 45  PO2ART 69* 185*   Liver Enzymes  Recent Labs Lab 06/10/15 1435 06/11/15 1519  AST 301* 108*  ALT 150* 112*  ALKPHOS 87 68  BILITOT 6.7* 5.4*  ALBUMIN 3.5 2.8*   Cardiac Enzymes  Recent Labs Lab 06/10/15 1435 06/11/15 0040 06/11/15 0312  TROPONINI  0.20* 0.22* 0.27*   Glucose  Recent Labs Lab 06/12/15 0739 06/12/15 1246 06/12/15 1601 06/12/15 2010 06/12/15 2357 06/13/15 0732  GLUCAP 98 95 115* 105* 94 97     Recent Results (from the past 240 hour(s))  Blood Culture (routine x 2)     Status: None (Preliminary result)   Collection Time: 06/10/15  2:35 PM  Result Value Ref Range Status   Specimen Description BLOOD RIGHT WRIST  Final   Special Requests BOTTLES DRAWN AEROBIC AND ANAEROBIC  1CC  Final   Culture NO GROWTH 3 DAYS  Final   Report Status PENDING  Incomplete  Blood Culture (routine x 2)     Status: None (Preliminary result)   Collection Time: 06/10/15  2:40 PM  Result Value Ref Range Status   Specimen Description BLOOD LEFT ASSIST CONTROL  Final   Special Requests   Final    BOTTLES DRAWN AEROBIC AND ANAEROBIC  AER 6CC ANA 4CC   Culture NO GROWTH 3 DAYS  Final   Report Status PENDING  Incomplete  Urine culture     Status: None   Collection Time: 06/10/15  4:42 PM  Result Value Ref Range Status   Specimen Description URINE, RANDOM  Final   Special Requests NONE  Final   Culture 4,000 COLONIES/mL INSIGNIFICANT GROWTH  Final   Report Status 06/12/2015 FINAL  Final  MRSA PCR Screening     Status: None   Collection Time: 06/10/15  6:27 PM  Result Value Ref Range Status   MRSA by PCR NEGATIVE NEGATIVE Final    Comment:        The GeneXpert MRSA Assay (FDA approved for NASAL specimens only), is one component of a comprehensive MRSA colonization surveillance program. It is not intended to diagnose MRSA infection nor to guide or monitor treatment for MRSA infections.   Culture, sputum-assessment     Status: None   Collection Time: 06/11/15  2:50 PM  Result Value Ref Range Status   Specimen Description SPUTUM  Final   Special Requests NONE  Final   Sputum evaluation THIS SPECIMEN IS ACCEPTABLE FOR SPUTUM CULTURE  Final   Report Status 06/13/2015 FINAL  Final  Culture, respiratory (NON-Expectorated)      Status: None (Preliminary result)   Collection Time: 06/11/15  2:50 PM  Result Value Ref Range Status   Specimen Description SPUTUM  Final   Special Requests NONE Reflexed from [id number]  Final   Gram Stain PENDING  Incomplete   Culture MODERATE GROWTH STAPHYLOCOCCUS AUREUS  Final   Report Status PENDING  Incomplete   Organism ID, Bacteria STAPHYLOCOCCUS  AUREUS  Final      Susceptibility   Staphylococcus aureus - MIC*    CIPROFLOXACIN <=0.5 SENSITIVE Sensitive     GENTAMICIN <=0.5 SENSITIVE Sensitive     OXACILLIN 0.5 SENSITIVE Sensitive     TRIMETH/SULFA <=10 SENSITIVE Sensitive     CEFOXITIN SCREEN NEGATIVE Sensitive     Inducible Clindamycin NEGATIVE Sensitive     TETRACYCLINE Value in next row Sensitive      SENSITIVE<=1    * MODERATE GROWTH STAPHYLOCOCCUS AUREUS     Current facility-administered medications:  .  [COMPLETED] amiodarone (NEXTERONE) 1.8 mg/mL load via infusion 150 mg, 150 mg, Intravenous, Once, 150 mg at 06/11/15 2001 **FOLLOWED BY** [EXPIRED] amiodarone (NEXTERONE PREMIX) 360 MG/200ML (1.8 mg/mL) IV infusion, 60 mg/hr, Intravenous, Continuous, Last Rate: 33.3 mL/hr at 06/11/15 2330, 60 mg/hr at 06/11/15 2330 **FOLLOWED BY** amiodarone (NEXTERONE PREMIX) 360 MG/200ML (1.8 mg/mL) IV infusion, 30 mg/hr, Intravenous, Continuous, Wellington Hampshire, MD, Last Rate: 16.7 mL/hr at 06/12/15 2013, 30 mg/hr at 06/12/15 2013 .  ammonium lactate (LAC-HYDRIN) 12 % lotion, , Topical, PRN, Florene Glen, MD .  Ampicillin-Sulbactam (UNASYN) 3 g in sodium chloride 0.9 % 100 mL IVPB, 3 g, Intravenous, Q6H, Charlett Nose, RPH, 3 g at 06/13/15 0448 .  antiseptic oral rinse solution (CORINZ), 7 mL, Mouth Rinse, QID, Demetrios Loll, MD, 7 mL at 06/13/15 0400 .  aspirin chewable tablet 81 mg, 81 mg, Per Tube, Daily, Demetrios Loll, MD, 81 mg at 06/12/15 1108 .  budesonide (PULMICORT) nebulizer solution 0.5 mg, 0.5 mg, Nebulization, BID, Flora Lipps, MD, 0.5 mg at 06/12/15 1958 .  chlorhexidine  gluconate (PERIDEX) 0.12 % solution 15 mL, 15 mL, Mouth Rinse, BID, Demetrios Loll, MD, 15 mL at 06/12/15 2200 .  fentaNYL (SUBLIMAZE) 100 MCG/2ML injection, , , ,  .  fentaNYL (SUBLIMAZE) 100 MCG/2ML injection, , , ,  .  fentaNYL 2535mcg in NS 277mL (102mcg/ml) infusion-PREMIX, 10 mcg/hr, Intravenous, Continuous, Flora Lipps, MD .  heparin ADULT infusion 100 units/mL (25000 units/250 mL), 1,950 Units/hr, Intravenous, Continuous, Melissa D Maccia, RPH, Last Rate: 19.5 mL/hr at 06/12/15 2059, 1,950 Units/hr at 06/12/15 2059 .  heparin injection 1,000-6,000 Units, 1,000-6,000 Units, CRRT, PRN, Munsoor Lateef, MD .  ipratropium-albuterol (DUONEB) 0.5-2.5 (3) MG/3ML nebulizer solution 3 mL, 3 mL, Nebulization, Q6H, Flora Lipps, MD, 3 mL at 06/13/15 0139 .  lactulose (CHRONULAC) 10 GM/15ML solution 30 g, 30 g, Oral, BID, Flora Lipps, MD, 30 g at 06/13/15 0010 .  midazolam (VERSED) 2 MG/2ML injection, , , ,  .  midazolam (VERSED) 2 MG/2ML injection, , , ,  .  norepinephrine (LEVOPHED) 4mg  in D5W 241mL premix infusion, 0-40 mcg/min, Intravenous, Titrated, Max Sane, MD .  norepinephrine (LEVOPHED) 4mg /21mL infusion, , , ,  .  pureflow IV solution for Dialysis, , CRRT, Continuous, Munsoor Lateef, MD .  rocuronium (ZEMURON) injection, , Intravenous, PRN, Hinda Kehr, MD, 100 mg at 06/10/15 1531  IMAGING    No results found.    Indwelling Urinary Catheter continued, requirement due to   Reason to continue Indwelling Urinary Catheter for strict Intake/Output monitoring for hemodynamic instability   Central Line continued, requirement due to   Reason to continue [employer] Monitoring of central venous pressure or other hemodynamic parameters       MAJOR EVENTS/TEST RESULTS: 8/14 intubated 8/15 extubated 8/17 re-intubated 8/17 start CRRT   INDWELLING DEVICES::LT subclavian vein 8/14 MICRO DATA: MRSA PCR>> NEG 8/14 Urine  Blood Resp   ANTIMICROBIALS: unasyn  ASSESSMENT/PLAN   64  yo white male admitted to ICU for acute resp hypercapnic adn hypoxic resp failure from acute CHF exacerbation with acute liver dysfunction/liver failure with acute renal failure with acute encephalopathy with mixed resp and metabolic acidosis with recurrent resp failure Patient emergently intubated for acute CHF, and acidosis with worsening renal failure  PULMONARY 1.Respiratory Failure -continue Full MV support -continue Bronchodilator Therapy -Wean Fio2 and PEEP as tolerated  CARDIOVASCULAR ACUTE CHF -intubated -continue vent support  RENAL Renal Failure-most likely due to ATN -follow chem 7 -follow UO -continue Foley Catheter-assess need -vasc cath placed for CRRT-nephrology consulted   GASTROINTESTINAL GI Prophylaxic -OG placed-will start TF's  HEMATOLOGIC Follow h/h  INFECTIOUS ?cellulitis of LE -on unasyn All cultures neg to date  ENDOCRINE - ICU hypoglycemic\Hyperglycemia protocol   NEUROLOGIC - intubated and sedated - minimal sedation to achieve a RASS goal: -1      I have personally obtained a history, examined the patient, evaluated laboratory and independently reviewed  imaging results, formulated the assessment and plan and placed orders.  The Patient requires high complexity decision making for assessment and support, frequent evaluation and titration of therapies, application of advanced monitoring technologies and extensive interpretation of multiple databases. Critical Care Time devoted to patient care services described in this note is 45 minutes.   Overall, patient is critically ill, prognosis is guarded. Patient at high risk for cardiac arrest and death.  Patient with multiorgan failure-liver, cardiac,resp,renal.   Corrin Parker, M.D.  [facility]  Medical Director [place] Director [place] Department

## 2015-06-13 NOTE — Progress Notes (Signed)
Palliative Care Update:  Palliative Care consult is initiated.  I have seen and examined patient and I have reviewed the chart and talked with his nurse.   As it is late, I will follow up in the am with attempts to have dialogue with those persons involved in patient's life and care issues.  Full note to follow.    Kirby Funk, MD

## 2015-06-13 NOTE — Progress Notes (Signed)
Speech Therapy Note: consulted w/ NSG this AM. Pt remains on BiPAP and unable to participate in BSE. NSG to contact SLP when pt is able to wean from BiPAP and be appropriate for BSE.

## 2015-06-13 NOTE — Procedures (Signed)
Endotracheal Intubation: Patient required placement of an artificial airway secondary to resp failure.   Consent: Emergent.   Hand washing performed prior to starting the procedure.   Medications administered for sedation prior to procedure: Midazolam 4 mg IV,  Vecuronium 10 mg IV, Fentanyl 100 mcg IV.   Procedure: A time out procedure was called and correct patient, name, & ID confirmed. Needed supplies and equipment were assembled and checked to include ETT, 10 ml syringe, Glidescope, Mac and Miller blades, suction, oxygen and bag mask valve, end tidal CO2 monitor. Patient was positioned to align the mouth and pharynx to facilitate visualization of the glottis.  Heart rate, SpO2 and blood pressure was continuously monitored during the procedure. Pre-oxygenation was conducted prior to intubation and endotracheal tube was placed through the vocal cords into the trachea.  During intubation an assistant applied gentle pressure to the cricoid cartilage.   The artificial airway was placed under direct visualization via glidescope route using a 8.5 ETT on the first attempt.    ETT was secured at 23 cm mark.    Placement was confirmed by auscuitation of lungs with good breath sounds bilaterally and no stomach sounds.  Condensation was noted on endotracheal tube.  Pulse ox 99%.  CO2 detector in place with appropriate color change.   Complications: None .   Operator: Taryn Nave.   Chest radiograph ordered and pending.   Comments: OGT placed via glidescope.  Corrin Parker, M.D.  [facility]  Medical Director [place] Director [facility] Cardio-Pulmonary Department

## 2015-06-13 NOTE — Consult Note (Signed)
ANTICOAGULATION CONSULT NOTE - Initial Consult  Pharmacy Consult for heparin Indication: atrial fibrillation  Allergies  Allergen Reactions  . Penicillins Nausea And Vomiting    Patient Measurements: Height: 5\' 8"  (172.7 cm) Weight: 282 lb 3 oz (128 kg) IBW/kg (Calculated) : 68.4 Heparin Dosing Weight: 97.4  Vital Signs: Temp: 96.1 F (35.6 C) (08/17 1100) BP: 95/69 mmHg (08/17 1500) Pulse Rate: 77 (08/17 1500)  Labs:  Recent Labs  06/11/15 0040 06/11/15 0312  06/12/15 0300 06/12/15 1030 06/13/15 0143 06/13/15 1028 06/13/15 1230  HGB  --   --   --  12.9*  --  12.2*  --   --   HCT  --   --   --  40.0  --  38.5*  --   --   PLT  --   --   --  98*  --  85*  --   --   HEPARINUNFRC  --   --   < > 0.14* 0.20* 0.42 0.40  --   CREATININE  --   --   < >  --   --  3.64* 3.99* 3.80*  TROPONINI 0.22* 0.27*  --   --   --   --   --   --   < > = values in this interval not displayed.  Estimated Creatinine Clearance: 26.3 mL/min (by C-G formula based on Cr of 3.8).   Medical History: Past Medical History  Diagnosis Date  . Chronic atrial fibrillation     a. not on long term anticoagulation  . Pneumonia 2011  . Chronic systolic CHF (congestive heart failure)   . Cellulitis     Medications:  Scheduled:  . ampicillin-sulbactam (UNASYN) IV  3 g Intravenous Q6H  . antiseptic oral rinse  7 mL Mouth Rinse QID  . aspirin  81 mg Per Tube Daily  . budesonide (PULMICORT) nebulizer solution  0.5 mg Nebulization BID  . chlorhexidine gluconate  15 mL Mouth Rinse BID  . feeding supplement (PRO-STAT SUGAR FREE 64)  30 mL Oral BID  . feeding supplement (VITAL HIGH PROTEIN)  1,000 mL Per Tube Q24H  . free water  25 mL Per Tube 6 times per day  . ipratropium-albuterol  3 mL Nebulization Q6H  . lactulose  30 g Oral BID    Assessment: Pharmacy consulted for heparin drip management for 64 yo male with afib. Patient is currently requiring mechanical ventilation and CRRT. Patient is  currently receiving heparin at 1950 units/hr (19.68mL/hr).    Goal of Therapy:  Heparin level 0.3-0.7 units/ml Monitor platelets by anticoagulation protocol: Yes   Plan:   Patient's anti-Xa level is at goal. Will obtain follow-up level at 1830 as patient has been re-intubated and initiated on CRRT.   Pharmacy will continue to monitor and adjust per consult.     Parv Manthey L, Pharm.D. Clinical Pharmacist 06/13/2015,3:50 PM

## 2015-06-13 NOTE — Progress Notes (Signed)
Patient intubated by Dr. Mortimer Fries with 8.5 ETT, 23cm at lip. Condensation noted in ETT, positive color change on C02 detector, and equal bilateral breath sounds.   ETT secured with commercial ETT holder on right side. Dr. Mortimer Fries assisted with intubation by Rose,CP.

## 2015-06-13 NOTE — Consult Note (Signed)
ANTICOAGULATION CONSULT NOTE - Initial Consult  Pharmacy Consult for heparin Indication: atrial fibrillation  Allergies  Allergen Reactions  . Penicillins Nausea And Vomiting    Patient Measurements: Height: 5\' 8"  (172.7 cm) Weight: 273 lb 13 oz (124.2 kg) IBW/kg (Calculated) : 68.4 Heparin Dosing Weight: 97.4  Vital Signs: BP: 99/69 mmHg (08/16 1700) Pulse Rate: 97 (08/17 0140)  Labs:  Recent Labs  06/10/15 1435 06/11/15 0040 06/11/15 0312 06/11/15 1519 06/12/15 0300 06/12/15 1030 06/13/15 0143  HGB 13.4  --   --   --  12.9*  --   --   HCT 42.0  --   --   --  40.0  --   --   PLT 155  --   --   --  98*  --   --   APTT 32  --   --   --   --   --   --   LABPROT 22.8*  --   --   --   --   --   --   INR 2.00  --   --   --   --   --   --   HEPARINUNFRC  --   --   --   --  0.14* 0.20* 0.42  CREATININE 2.46*  --   --  2.64*  --   --   --   TROPONINI 0.20* 0.22* 0.27*  --   --   --   --     Estimated Creatinine Clearance: 37.2 mL/min (by C-G formula based on Cr of 2.64).   Medical History: Past Medical History  Diagnosis Date  . Chronic atrial fibrillation     a. not on long term anticoagulation  . Pneumonia 2011  . Chronic systolic CHF (congestive heart failure)   . Cellulitis     Medications:  Scheduled:  . ampicillin-sulbactam (UNASYN) IV  3 g Intravenous Q6H  . antiseptic oral rinse  7 mL Mouth Rinse QID  . aspirin  81 mg Per Tube Daily  . budesonide (PULMICORT) nebulizer solution  0.5 mg Nebulization BID  . chlorhexidine gluconate  15 mL Mouth Rinse BID  . ipratropium-albuterol  3 mL Nebulization Q6H  . lactulose  30 g Oral BID    Assessment: Pt is a 64 year old male in Afib Goal of Therapy:  Heparin level 0.3-0.7 units/ml Monitor platelets by anticoagulation protocol: Yes   Plan:   Heparin level returned at 0.42. Will continue current order and check heparin level 8/18 AM.   Laural Benes, Pharm.D. Clinical Pharmacist 06/13/2015,2:32  AM

## 2015-06-13 NOTE — Progress Notes (Signed)
ANTICOAGULATION CONSULT NOTE - Follow Up Consult  Pharmacy Consult for Heparin Indication: atrial fibrillation  Allergies  Allergen Reactions  . Penicillins Nausea And Vomiting    Patient Measurements: Height: 5\' 8"  (172.7 cm) Weight: 282 lb 3 oz (128 kg) IBW/kg (Calculated) : 68.4 Heparin Dosing Weight: 98.2 kg  Vital Signs: Temp: 97.3 F (36.3 C) (08/17 1900) Temp Source: Oral (08/17 1900) BP: 123/72 mmHg (08/17 2000) Pulse Rate: 93 (08/17 2000)  Labs:  Recent Labs  06/11/15 0040 06/11/15 0312  06/12/15 0300  06/13/15 0143 06/13/15 1028 06/13/15 1230 06/13/15 1626 06/13/15 1935  HGB  --   --   --  12.9*  --  12.2*  --   --   --   --   HCT  --   --   --  40.0  --  38.5*  --   --   --   --   PLT  --   --   --  98*  --  85*  --   --   --   --   HEPARINUNFRC  --   --   --  0.14*  < > 0.42 0.40  --   --  0.24*  CREATININE  --   --   < >  --   --  3.64* 3.99* 3.80* 3.26*  --   TROPONINI 0.22* 0.27*  --   --   --   --   --   --   --   --   < > = values in this interval not displayed.  Estimated Creatinine Clearance: 30.6 mL/min (by C-G formula based on Cr of 3.26).   Medications:  Scheduled:  . ampicillin-sulbactam (UNASYN) IV  3 g Intravenous Q6H  . antiseptic oral rinse  7 mL Mouth Rinse QID  . aspirin  81 mg Per Tube Daily  . budesonide (PULMICORT) nebulizer solution  0.5 mg Nebulization BID  . chlorhexidine gluconate  15 mL Mouth Rinse BID  . feeding supplement (PRO-STAT SUGAR FREE 64)  30 mL Oral BID  . feeding supplement (VITAL HIGH PROTEIN)  1,000 mL Per Tube Q24H  . free water  25 mL Per Tube 6 times per day  . heparin  1,500 Units Intravenous Once  . ipratropium-albuterol  3 mL Nebulization Q6H  . lactulose  30 g Oral BID   Infusions:  . amiodarone 30 mg/hr (06/13/15 0945)  . fentaNYL infusion INTRAVENOUS 10 mcg/hr (06/13/15 0930)  . heparin 1,950 Units/hr (06/13/15 0930)  . norepinephrine 5 mcg/min (06/13/15 0855)  . pureflow 2,500 mL (06/13/15  1645)   PRN: ammonium lactate, heparin, midazolam, rocuronium  Assessment: Pharmacy consulted for heparin drip management for 64 yo male with afib. Patient is currently requiring mechanical ventilation and CRRT. Patient is currently receiving heparin at 1950 units/hr (19.68mL/hr).   Goal of Therapy:  Heparin level 0.3-0.7 units/ml Monitor platelets by anticoagulation protocol: Yes   Plan:  Heparin level is below goal so will bolus 1500 units and increase infusion to 2150 units/hr with the next HL 6 hours after rate change.   Ulice Dash D 06/13/2015,8:34 PM

## 2015-06-13 NOTE — Therapy (Signed)
CXR report recommended tube withdrawal 2 cm. ETT was secured at 25cm. Cuff deflated, tube withdrawn to 23 cm and secured, cuff inflated. Bilat breath sounds appreciated, good volume return.

## 2015-06-13 NOTE — Progress Notes (Signed)
Patient decline in health status discussed with Dr. Holley Raring and Dr. Mortimer Fries.  Nurse has been advised to prepare patient for intubation and temporary vascular access placement.

## 2015-06-13 NOTE — Consult Note (Signed)
[facility] CONSULT NOTE    Date: 06/13/2015                  Patient Name:  Aaron Harrison  MRN: [id number]  DOB: Jan 27, 1951  Age / Sex: 64 y.o., male         PCP: No PCP Per Patient                 Service Requesting Consult: Dr. Faylene Million                 Reason for Consult: Acute renal failure, CKD stage III            History of Present Illness: Patient is a 64 y.o. male with a PMHx of congestive heart failure ejection fraction 30-35%, prior pneumonia, lower extremity edema with cellulitis, atrial fibrillation who was admitted to [facility] on 06/10/2015 for evaluation of altered mental status and shortness of breath.  Upon admission the patient underwent intubation with transition to a mechanical ventilation. He is also found to have atrial fibrillation with rapid ventricular response upon initial presentation. He was subsequently moved to the critical care unit.  Patient was extubated and is currently on BiPAP. Patient unable to provide any significant history at this point in time. We are asked to see him for evaluation management of acute renal failure. His baseline creatinine appears to be 1.3. Creatinine has now trended up to 3.64 with a BUN of 66. Urine output was found to be 75 cc over the past 24 hours. He appears to be in need of diuresis but this has been unable to be performed secondary to hypotension and worsening renal function.   Medications: Outpatient medications: Prescriptions prior to admission  Medication Sig Dispense Refill Last Dose  . aspirin EC 81 MG tablet Take 81 mg by mouth daily.   unknown  . furosemide (LASIX) 20 MG tablet Take 1 tablet (20 mg total) by mouth daily. (Patient taking differently: Take 10 mg by mouth daily. ) 30 tablet 0 unknown    Current medications: Current Facility-Administered Medications  Medication Dose Route Frequency Provider Last Rate Last Dose  . 0.9 %  sodium chloride infusion   Intravenous Continuous  Max Sane, MD 75 mL/hr at 06/13/15 0400    . amiodarone (NEXTERONE PREMIX) 360 MG/200ML (1.8 mg/mL) IV infusion  30 mg/hr Intravenous Continuous Wellington Hampshire, MD 16.7 mL/hr at 06/12/15 2013 30 mg/hr at 06/12/15 2013  . ammonium lactate (LAC-HYDRIN) 12 % lotion   Topical PRN Florene Glen, MD      . Ampicillin-Sulbactam (UNASYN) 3 g in sodium chloride 0.9 % 100 mL IVPB  3 g Intravenous Q6H Charlett Nose, RPH   3 g at 06/13/15 0448  . antiseptic oral rinse solution (CORINZ)  7 mL Mouth Rinse QID Demetrios Loll, MD   7 mL at 06/13/15 0400  . aspirin chewable tablet 81 mg  81 mg Per Tube Daily Demetrios Loll, MD   81 mg at 06/12/15 1108  . budesonide (PULMICORT) nebulizer solution 0.5 mg  0.5 mg Nebulization BID Flora Lipps, MD   0.5 mg at 06/12/15 1958  . chlorhexidine gluconate (PERIDEX) 0.12 % solution 15 mL  15 mL Mouth Rinse BID Demetrios Loll, MD   15 mL at 06/12/15 2200  . heparin ADULT infusion 100 units/mL (25000 units/250 mL)  1,950 Units/hr Intravenous Continuous Ramond Dial, RPH 19.5 mL/hr at 06/12/15 2059 1,950 Units/hr at 06/12/15 2059  . ipratropium-albuterol (DUONEB)  0.5-2.5 (3) MG/3ML nebulizer solution 3 mL  3 mL Nebulization Q6H Flora Lipps, MD   3 mL at 06/13/15 0139  . lactulose (CHRONULAC) 10 GM/15ML solution 30 g  30 g Oral BID Flora Lipps, MD   30 g at 06/13/15 0010  . rocuronium (ZEMURON) injection   Intravenous PRN Hinda Kehr, MD   100 mg at 06/10/15 1531      Allergies: Allergies  Allergen Reactions  . Penicillins Nausea And Vomiting      Past Medical History: Past Medical History  Diagnosis Date  . Chronic atrial fibrillation     a. not on long term anticoagulation  . Pneumonia 2011  . Chronic systolic CHF (congestive heart failure)   . Cellulitis      Past Surgical History: History reviewed. No pertinent past surgical history.   Family History: Family History  Problem Relation Age of Onset  . Diabetes Mother   . Stroke Mother   . Heart failure  Father      Social History: Social History   Social History  . Marital Status: Single    Spouse Name: N/A  . Number of Children: N/A  . Years of Education: N/A   Occupational History  . Not on file.   Social History Main Topics  . Smoking status: Never Smoker   . Smokeless tobacco: Never Used  . Alcohol Use: No  . Drug Use: No  . Sexual Activity: Not on file   Other Topics Concern  . Not on file   Social History Narrative     Review of Systems: Patient unable to provide ROS as he was unable to concentrate on questions.  Vital Signs: Blood pressure 96/65, pulse 96, temperature 97.3 F (36.3 C), temperature source Oral, resp. rate 26, height 5\' 8"  (1.727 m), weight 128 kg (282 lb 3 oz), SpO2 94 %.  Weight trends: Filed Weights   06/11/15 0432 06/12/15 0357 06/13/15 0359  Weight: 124.2 kg (273 lb 13 oz) 124.2 kg (273 lb 13 oz) 128 kg (282 lb 3 oz)    Physical Exam: General: Critically ill appearing, on bipap  Head: Normocephalic, atraumatic.  Eyes: Eyes open, spontaneously tracks  Nose: Mucous membranes moist, not inflammed, nonerythematous.  Throat: Oral mucosa moist  Neck: Supple, difficult to assess JVD.  Lungs:  On bipap, basilar rales bilatearl  Heart: Irregular, no rubs  Abdomen:  BS normoactive. Soft, Nondistended, non-tender.  No masses or organomegaly.  Extremities: 3+ b/l LE edema  Neurologic: Awake, oriented to self/place, confused at times  Skin: Cellulitis with ulceration noted b/l LE's    Lab results: Basic Metabolic Panel:  Recent Labs Lab 06/10/15 1435 06/11/15 1519 06/13/15 0143  NA 138 138 141  K 5.5* 4.8 4.6  CL 103 106 107  CO2 26 24 26   GLUCOSE 112* 109* 119*  BUN 48* 55* 66*  CREATININE 2.46* 2.64* 3.64*  CALCIUM 8.7* 7.9* 7.8*    Liver Function Tests:  Recent Labs Lab 06/10/15 1435 06/11/15 1519  AST 301* 108*  ALT 150* 112*  ALKPHOS 87 68  BILITOT 6.7* 5.4*  PROT 7.2 6.0*  ALBUMIN 3.5 2.8*    Recent  Labs Lab 06/10/15 1435  LIPASE 20*    Recent Labs Lab 06/10/15 1435  AMMONIA 26    CBC:  Recent Labs Lab 06/10/15 1435 06/12/15 0300 06/13/15 0143  WBC 11.1* 10.6 8.4  NEUTROABS 9.8*  --  7.7*  HGB 13.4 12.9* 12.2*  HCT 42.0 40.0 38.5*  MCV 105.2*  105.9* 107.8*  PLT 155 98* 85*    Cardiac Enzymes:  Recent Labs Lab 06/09/15 2025 06/10/15 1435 06/11/15 0040 06/11/15 0312  TROPONINI 0.26* 0.20* 0.22* 0.27*    BNP: Invalid input(s): POCBNP  CBG:  Recent Labs Lab 06/12/15 1246 06/12/15 1601 06/12/15 2010 06/12/15 2357 06/13/15 0732  GLUCAP 95 115* 105* 94 97    Microbiology: Results for orders placed or performed during the hospital encounter of 06/10/15  Blood Culture (routine x 2)     Status: None (Preliminary result)   Collection Time: 06/10/15  2:35 PM  Result Value Ref Range Status   Specimen Description BLOOD RIGHT WRIST  Final   Special Requests BOTTLES DRAWN AEROBIC AND ANAEROBIC  1CC  Final   Culture NO GROWTH 2 DAYS  Final   Report Status PENDING  Incomplete  Blood Culture (routine x 2)     Status: None (Preliminary result)   Collection Time: 06/10/15  2:40 PM  Result Value Ref Range Status   Specimen Description BLOOD LEFT ASSIST CONTROL  Final   Special Requests   Final    BOTTLES DRAWN AEROBIC AND ANAEROBIC  AER 6CC ANA 4CC   Culture NO GROWTH 2 DAYS  Final   Report Status PENDING  Incomplete  Urine culture     Status: None   Collection Time: 06/10/15  4:42 PM  Result Value Ref Range Status   Specimen Description URINE, RANDOM  Final   Special Requests NONE  Final   Culture 4,000 COLONIES/mL INSIGNIFICANT GROWTH  Final   Report Status 06/12/2015 FINAL  Final  MRSA PCR Screening     Status: None   Collection Time: 06/10/15  6:27 PM  Result Value Ref Range Status   MRSA by PCR NEGATIVE NEGATIVE Final    Comment:        The GeneXpert MRSA Assay (FDA approved for NASAL specimens only), is one component of a comprehensive MRSA  colonization surveillance program. It is not intended to diagnose MRSA infection nor to guide or monitor treatment for MRSA infections.   Culture, sputum-assessment     Status: None   Collection Time: 06/11/15  2:50 PM  Result Value Ref Range Status   Specimen Description SPUTUM  Final   Special Requests NONE  Final   Sputum evaluation THIS SPECIMEN IS ACCEPTABLE FOR SPUTUM CULTURE  Final   Report Status 06/13/2015 FINAL  Final  Culture, respiratory (NON-Expectorated)     Status: None (Preliminary result)   Collection Time: 06/11/15  2:50 PM  Result Value Ref Range Status   Specimen Description SPUTUM  Final   Special Requests NONE Reflexed from [id number]  Final   Gram Stain PENDING  Incomplete   Culture MODERATE GROWTH STAPHYLOCOCCUS AUREUS  Final   Report Status PENDING  Incomplete   Organism ID, Bacteria STAPHYLOCOCCUS AUREUS  Final      Susceptibility   Staphylococcus aureus - MIC*    CIPROFLOXACIN <=0.5 SENSITIVE Sensitive     GENTAMICIN <=0.5 SENSITIVE Sensitive     OXACILLIN 0.5 SENSITIVE Sensitive     TRIMETH/SULFA <=10 SENSITIVE Sensitive     CEFOXITIN SCREEN NEGATIVE Sensitive     Inducible Clindamycin NEGATIVE Sensitive     TETRACYCLINE Value in next row Sensitive      SENSITIVE<=1    * MODERATE GROWTH STAPHYLOCOCCUS AUREUS    Coagulation Studies:  Recent Labs  06/10/15 1435  LABPROT 22.8*  INR 2.00    Urinalysis:  Recent Labs  06/10/15 [place]*  LABSPEC 1.020  PHURINE 5.0  GLUCOSEU NEGATIVE  HGBUR 1+*  BILIRUBINUR NEGATIVE  KETONESUR NEGATIVE  PROTEINUR 100*  NITRITE NEGATIVE  LEUKOCYTESUR NEGATIVE      Imaging: Dg Chest Port 1 View  06/12/2015   CLINICAL DATA:  Shortness of breath.  EXAM: PORTABLE CHEST - 1 VIEW  COMPARISON:  06/11/2015.  FINDINGS: Interim removal of endotracheal tube. Left subclavian line in good anatomic position. Cardiomegaly. Progressive bilateral pulmonary infiltrates. Small left pleural effusion. No  pneumothorax.  IMPRESSION: 1. Interim removal of endotracheal tube and NG tube. Left sub plate central line in stable position. 2. Cardiomegaly with pulmonary venous congestion and significant interim progression of bilateral pulmonary infiltrates. Persistent left pleural effusion. Findings are consistent with progressive congestive heart failure. Underlying pneumonia cannot be excluded.   Electronically Signed   By: Marcello Moores  Register   On: 06/12/2015 07:41      Assessment & Plan: Pt is a 64 y.o. yo male with a PMHx of congestive heart failure ejection fraction 30-35%, prior pneumonia, lower extremity edema with cellulitis, atrial fibrillation who was admitted to [facility] on 06/10/2015 for evaluation of altered mental status and shortness of breath.  Upon admission the patient underwent intubation with transition to a mechanical ventilation.  1. Acute renal failure/chronic kidney disease stage III with baseline creatinine 1.3/proteinuria. The patient most likely has acute renal failure secondary to altered cardiorenal hemodynamics. His ejection fraction is low at 30%. We will proceed with renal ultrasound, SPEP, UPEP, ANA, ANCA antibodies, GBM antibodies, C3, and C4. He appears to be in need of diuresis and is grossly volume overloaded. At this point in time given renal failure and volume overload we suggest continuous renal replacement therapy. We will request pulmonary critical care to place temporary dialysis catheter. We will begin with an ultrafiltration target of 50 cc per hour and increase as tolerated.  2. Acute respiratory failure. Patient currently on BiPAP though he was intubated earlier in the admission. He is high risk for reintubation however.  3.  Acute systolic heart failure. Patient grossly volume overloaded as above. We'll attempt CRRT as above to treat the underlying heart failure.  4. Overall prognosis guarded.

## 2015-06-13 NOTE — Consult Note (Signed)
[facility] Infectious Disease     Reason for Consult: Sepsis    Referring Physician: Max Sane Date of Admission:  06/10/2015   Principal Problem:   Acute respiratory failure Active Problems:   Community acquired pneumonia   Acute renal failure   Acute CHF   Hyperkalemia   Ascites   Bilateral lower leg cellulitis   Atrial fibrillation   Sepsis   Coagulopathy   Acute respiratory failure with hypoxemia   Multi-organ failure with liver failure   HPI: Aaron Harrison is a 64 y.o. male with a known history of A. fib, CHF, pneumonia, recurrent LE cellulitis. Admitted 8/14 with AMS, weakness, A fib RVR and respiratory failure.   He had cellulitis in June and had wound cx with Pseudomona, Proteus and MSSA.  Dced on bactrim and cipro - had unawraps and was following with CHF clinic and with vascular.    Since admit has had progressive multiorgan failure, is intubated, on CRRT.  Sputum cx this admission growing MSSA. [place] negative.  CXR on admit with RUL infiltrated.  Treated with levo, zosyn and vanco.   Past Medical History  Diagnosis Date  . Chronic atrial fibrillation     a. not on long term anticoagulation  . Pneumonia 2011  . Chronic systolic CHF (congestive heart failure)   . Cellulitis    History reviewed. No pertinent past surgical history. Social History  Substance Use Topics  . Smoking status: Never Smoker   . Smokeless tobacco: Never Used  . Alcohol Use: No   Family History  Problem Relation Age of Onset  . Diabetes Mother   . Stroke Mother   . Heart failure Father     Allergies:  Allergies  Allergen Reactions  . Penicillins Nausea And Vomiting    Current antibiotics: Antibiotics Given (last 72 hours)    Date/Time Action Medication Dose Rate   06/10/15 2118 Given   levofloxacin (LEVAQUIN) IVPB 750 mg 750 mg 100 mL/hr   06/11/15 0027 Given   piperacillin-tazobactam (ZOSYN) IVPB 4.5 g 4.5 g 200 mL/hr   06/11/15 0746 Given    piperacillin-tazobactam (ZOSYN) IVPB 4.5 g 4.5 g 200 mL/hr   06/11/15 1515 Given   piperacillin-tazobactam (ZOSYN) IVPB 4.5 g 4.5 g 200 mL/hr   06/11/15 2316 Given   piperacillin-tazobactam (ZOSYN) IVPB 4.5 g 4.5 g 200 mL/hr   06/12/15 0829 Given   piperacillin-tazobactam (ZOSYN) IVPB 4.5 g 4.5 g 200 mL/hr   06/12/15 1748 Given   Ampicillin-Sulbactam (UNASYN) 3 g in sodium chloride 0.9 % 100 mL IVPB 3 g 100 mL/hr   06/13/15 0011 Given   Ampicillin-Sulbactam (UNASYN) 3 g in sodium chloride 0.9 % 100 mL IVPB 3 g 100 mL/hr   06/13/15 0448 Given   Ampicillin-Sulbactam (UNASYN) 3 g in sodium chloride 0.9 % 100 mL IVPB 3 g 100 mL/hr      MEDICATIONS: . ampicillin-sulbactam (UNASYN) IV  3 g Intravenous Q6H  . antiseptic oral rinse  7 mL Mouth Rinse QID  . aspirin  81 mg Per Tube Daily  . budesonide (PULMICORT) nebulizer solution  0.5 mg Nebulization BID  . chlorhexidine gluconate  15 mL Mouth Rinse BID  . feeding supplement (PRO-STAT SUGAR FREE 64)  30 mL Oral BID  . feeding supplement (VITAL HIGH PROTEIN)  1,000 mL Per Tube Q24H  . free water  25 mL Per Tube 6 times per day  . ipratropium-albuterol  3 mL Nebulization Q6H  . lactulose  30 g Oral BID  Review of Systems - 11 systems reviewed and negative per HPI   OBJECTIVE: Temp:  [97.3 F (36.3 C)] 97.3 F (36.3 C) (08/16 1300) Pulse Rate:  [25-149] 47 (08/17 1005) Resp:  [10-28] 15 (08/17 1005) BP: (71-140)/(50-93) 111/67 mmHg (08/17 1005) SpO2:  [90 %-100 %] 96 % (08/17 1152) FiO2 (%):  [30 %-100 %] 40 % (08/17 1152) Weight:  [128 kg (282 lb 3 oz)] 128 kg (282 lb 3 oz) (08/17 0359) Physical Exam  Constitutional: obese, intubated, critically ill HENT: anicteric Mouth/Throat:ETT tube in place Cardiovascular: IRR Pulmonary/Chest: mech breath sounds Abdominal: obese, distedned, tympanic  Neurological: intubated and sedated Ext 2+ bil lE edema Skin: Bil LE with chronic skin changes, brawny edema Has on R ant leg an open  linear wound with mild drainage L post calf with open ulcer, some drainage Access L [place] TLC, R groin temp HD cath   LABS: Results for orders placed or performed during the hospital encounter of 06/10/15 (from the past 48 hour(s))  Culture, sputum-assessment     Status: None   Collection Time: 06/11/15  2:50 PM  Result Value Ref Range   Specimen Description SPUTUM    Special Requests NONE    Sputum evaluation THIS SPECIMEN IS ACCEPTABLE FOR SPUTUM CULTURE    Report Status 06/13/2015 FINAL   Culture, respiratory (NON-Expectorated)     Status: None (Preliminary result)   Collection Time: 06/11/15  2:50 PM  Result Value Ref Range   Specimen Description SPUTUM    Special Requests NONE Reflexed from [id number]    Gram Stain PENDING    Culture MODERATE GROWTH STAPHYLOCOCCUS AUREUS    Report Status PENDING    Organism ID, Bacteria STAPHYLOCOCCUS AUREUS       Susceptibility   Staphylococcus aureus - MIC*    CIPROFLOXACIN <=0.5 SENSITIVE Sensitive     GENTAMICIN <=0.5 SENSITIVE Sensitive     OXACILLIN 0.5 SENSITIVE Sensitive     TRIMETH/SULFA <=10 SENSITIVE Sensitive     CEFOXITIN SCREEN NEGATIVE Sensitive     Inducible Clindamycin NEGATIVE Sensitive     TETRACYCLINE Value in next row Sensitive      SENSITIVE<=1    * MODERATE GROWTH STAPHYLOCOCCUS AUREUS  Comprehensive metabolic panel     Status: Abnormal   Collection Time: 06/11/15  3:19 PM  Result Value Ref Range   Sodium 138 135 - 145 mmol/L   Potassium 4.8 3.5 - 5.1 mmol/L   Chloride 106 101 - 111 mmol/L   CO2 24 22 - 32 mmol/L   Glucose, Bld 109 (H) 65 - 99 mg/dL   BUN 55 (H) 6 - 20 mg/dL   Creatinine, Ser 2.64 (H) 0.61 - 1.24 mg/dL   Calcium 7.9 (L) 8.9 - 10.3 mg/dL   Total Protein 6.0 (L) 6.5 - 8.1 g/dL   Albumin 2.8 (L) 3.5 - 5.0 g/dL   AST 108 (H) 15 - 41 U/L   ALT 112 (H) 17 - 63 U/L   Alkaline Phosphatase 68 38 - 126 U/L   Total Bilirubin 5.4 (H) 0.3 - 1.2 mg/dL   GFR calc non Af Amer 24 (L) >60 mL/min   GFR calc Af  Amer 28 (L) >60 mL/min    Comment: (NOTE) The eGFR has been calculated using the CKD EPI equation. This calculation has not been validated in all clinical situations. eGFR's persistently <60 mL/min signify possible Chronic Kidney Disease.    Anion gap 8 5 - 15  Glucose, capillary     Status: None  Collection Time: 06/11/15  5:42 PM  Result Value Ref Range   Glucose-Capillary 92 65 - 99 mg/dL  Glucose, capillary     Status: None   Collection Time: 06/11/15  7:56 PM  Result Value Ref Range   Glucose-Capillary 83 65 - 99 mg/dL  Glucose, capillary     Status: None   Collection Time: 06/11/15 11:49 PM  Result Value Ref Range   Glucose-Capillary 96 65 - 99 mg/dL  CBC     Status: Abnormal   Collection Time: 06/12/15  3:00 AM  Result Value Ref Range   WBC 10.6 3.8 - 10.6 K/uL   RBC 3.78 (L) 4.40 - 5.90 MIL/uL   Hemoglobin 12.9 (L) 13.0 - 18.0 g/dL   HCT 40.0 40.0 - 52.0 %   MCV 105.9 (H) 80.0 - 100.0 fL   MCH 34.1 (H) 26.0 - 34.0 pg   MCHC 32.1 32.0 - 36.0 g/dL   RDW 17.5 (H) 11.5 - 14.5 %   Platelets 98 (L) 150 - 440 K/uL    Comment: RESULT REPEATED AND VERIFIED  Heparin level (unfractionated)     Status: Abnormal   Collection Time: 06/12/15  3:00 AM  Result Value Ref Range   Heparin Unfractionated 0.14 (L) 0.30 - 0.70 IU/mL    Comment:        IF HEPARIN RESULTS ARE BELOW EXPECTED VALUES, AND PATIENT DOSAGE HAS BEEN CONFIRMED, SUGGEST FOLLOW UP TESTING OF ANTITHROMBIN III LEVELS.   Glucose, capillary     Status: Abnormal   Collection Time: 06/12/15  3:51 AM  Result Value Ref Range   Glucose-Capillary 105 (H) 65 - 99 mg/dL  Glucose, capillary     Status: None   Collection Time: 06/12/15  7:39 AM  Result Value Ref Range   Glucose-Capillary 98 65 - 99 mg/dL  Heparin level (unfractionated)     Status: Abnormal   Collection Time: 06/12/15 10:30 AM  Result Value Ref Range   Heparin Unfractionated 0.20 (L) 0.30 - 0.70 IU/mL    Comment:        IF HEPARIN RESULTS ARE  BELOW EXPECTED VALUES, AND PATIENT DOSAGE HAS BEEN CONFIRMED, SUGGEST FOLLOW UP TESTING OF ANTITHROMBIN III LEVELS.   Glucose, capillary     Status: None   Collection Time: 06/12/15 12:46 PM  Result Value Ref Range   Glucose-Capillary 95 65 - 99 mg/dL  Glucose, capillary     Status: Abnormal   Collection Time: 06/12/15  4:01 PM  Result Value Ref Range   Glucose-Capillary 115 (H) 65 - 99 mg/dL  Glucose, capillary     Status: Abnormal   Collection Time: 06/12/15  8:10 PM  Result Value Ref Range   Glucose-Capillary 105 (H) 65 - 99 mg/dL  Glucose, capillary     Status: None   Collection Time: 06/12/15 11:57 PM  Result Value Ref Range   Glucose-Capillary 94 65 - 99 mg/dL  Heparin level (unfractionated)     Status: None   Collection Time: 06/13/15  1:43 AM  Result Value Ref Range   Heparin Unfractionated 0.42 0.30 - 0.70 IU/mL    Comment:        IF HEPARIN RESULTS ARE BELOW EXPECTED VALUES, AND PATIENT DOSAGE HAS BEEN CONFIRMED, SUGGEST FOLLOW UP TESTING OF ANTITHROMBIN III LEVELS.   CBC with Differential/Platelet     Status: Abnormal   Collection Time: 06/13/15  1:43 AM  Result Value Ref Range   WBC 8.4 3.8 - 10.6 K/uL   RBC 3.57 (L) 4.40 -  5.90 MIL/uL   Hemoglobin 12.2 (L) 13.0 - 18.0 g/dL   HCT 38.5 (L) 40.0 - 52.0 %   MCV 107.8 (H) 80.0 - 100.0 fL   MCH 34.1 (H) 26.0 - 34.0 pg   MCHC 31.7 (L) 32.0 - 36.0 g/dL   RDW 17.6 (H) 11.5 - 14.5 %   Platelets 85 (L) 150 - 440 K/uL   Neutrophils Relative % 92% %   Neutro Abs 7.7 (H) 1.4 - 6.5 K/uL   Lymphocytes Relative 4% %   Lymphs Abs 0.3 (L) 1.0 - 3.6 K/uL   Monocytes Relative 2% %   Monocytes Absolute 0.2 0.2 - 1.0 K/uL   Eosinophils Relative 2% %   Eosinophils Absolute 0.1 0 - 0.7 K/uL   Basophils Relative 0% %   Basophils Absolute 0.0 0 - 0.1 K/uL  Basic metabolic panel     Status: Abnormal   Collection Time: 06/13/15  1:43 AM  Result Value Ref Range   Sodium 141 135 - 145 mmol/L   Potassium 4.6 3.5 - 5.1 mmol/L    Chloride 107 101 - 111 mmol/L   CO2 26 22 - 32 mmol/L   Glucose, Bld 119 (H) 65 - 99 mg/dL   BUN 66 (H) 6 - 20 mg/dL   Creatinine, Ser 3.64 (H) 0.61 - 1.24 mg/dL   Calcium 7.8 (L) 8.9 - 10.3 mg/dL   GFR calc non Af Amer 16 (L) >60 mL/min   GFR calc Af Amer 19 (L) >60 mL/min    Comment: (NOTE) The eGFR has been calculated using the CKD EPI equation. This calculation has not been validated in all clinical situations. eGFR's persistently <60 mL/min signify possible Chronic Kidney Disease.    Anion gap 8 5 - 15  Glucose, capillary     Status: None   Collection Time: 06/13/15  7:32 AM  Result Value Ref Range   Glucose-Capillary 97 65 - 99 mg/dL  Blood gas, arterial     Status: Abnormal   Collection Time: 06/13/15  9:55 AM  Result Value Ref Range   FIO2 0.40    Delivery systems VENTILATOR    Mode PRESSURE REGULATED VOLUME CONTROL    VT 500 mL   LHR 18 resp/min   Peep/cpap 5.0 cm H20   pH, Arterial 7.22 (L) 7.350 - 7.450   pCO2 arterial 55 (H) 32.0 - 48.0 mmHg   pO2, Arterial 117 (H) 83.0 - 108.0 mmHg   Bicarbonate 22.5 21.0 - 28.0 mEq/L   Acid-base deficit 5.8 (H) 0.0 - 2.0 mmol/L   O2 Saturation 97.7 %   Patient temperature 37.0    Collection site LEFT RADIAL    Sample type ARTERIAL DRAW    Allens test (pass/fail) POSITIVE (A) PASS   Mechanical Rate 18   Renal function     Status: Abnormal   Collection Time: 06/13/15 10:28 AM  Result Value Ref Range   Sodium 137 135 - 145 mmol/L   Potassium 4.2 3.5 - 5.1 mmol/L   Chloride 104 101 - 111 mmol/L   CO2 22 22 - 32 mmol/L   Glucose, Bld 114 (H) 65 - 99 mg/dL   BUN 71 (H) 6 - 20 mg/dL   Creatinine, Ser 3.99 (H) 0.61 - 1.24 mg/dL   Calcium 7.6 (L) 8.9 - 10.3 mg/dL   Phosphorus 7.4 (H) 2.5 - 4.6 mg/dL   Albumin 2.6 (L) 3.5 - 5.0 g/dL   GFR calc non Af Amer 15 (L) >60 mL/min   GFR calc  Af Amer 17 (L) >60 mL/min    Comment: (NOTE) The eGFR has been calculated using the CKD EPI equation. This calculation has not been  validated in all clinical situations. eGFR's persistently <60 mL/min signify possible Chronic Kidney Disease.    Anion gap 11 5 - 15  Magnesium     Status: Abnormal   Collection Time: 06/13/15 10:28 AM  Result Value Ref Range   Magnesium 2.6 (H) 1.7 - 2.4 mg/dL  Heparin level (unfractionated)     Status: None   Collection Time: 06/13/15 10:28 AM  Result Value Ref Range   Heparin Unfractionated 0.40 0.30 - 0.70 IU/mL    Comment:        IF HEPARIN RESULTS ARE BELOW EXPECTED VALUES, AND PATIENT DOSAGE HAS BEEN CONFIRMED, SUGGEST FOLLOW UP TESTING OF ANTITHROMBIN III LEVELS.   Glucose, capillary     Status: None   Collection Time: 06/13/15 12:06 PM  Result Value Ref Range   Glucose-Capillary 84 65 - 99 mg/dL   No components found for: ESR, C REACTIVE PROTEIN MICRO: Recent Results (from the past 720 hour(s))  Blood Culture (routine x 2)     Status: None (Preliminary result)   Collection Time: 06/10/15  2:35 PM  Result Value Ref Range Status   Specimen Description BLOOD RIGHT WRIST  Final   Special Requests BOTTLES DRAWN AEROBIC AND ANAEROBIC  1CC  Final   Culture NO GROWTH 3 DAYS  Final   Report Status PENDING  Incomplete  Blood Culture (routine x 2)     Status: None (Preliminary result)   Collection Time: 06/10/15  2:40 PM  Result Value Ref Range Status   Specimen Description BLOOD LEFT ASSIST CONTROL  Final   Special Requests   Final    BOTTLES DRAWN AEROBIC AND ANAEROBIC  AER 6CC ANA 4CC   Culture NO GROWTH 3 DAYS  Final   Report Status PENDING  Incomplete  Urine culture     Status: None   Collection Time: 06/10/15  4:42 PM  Result Value Ref Range Status   Specimen Description URINE, RANDOM  Final   Special Requests NONE  Final   Culture 4,000 COLONIES/mL INSIGNIFICANT GROWTH  Final   Report Status 06/12/2015 FINAL  Final  MRSA PCR Screening     Status: None   Collection Time: 06/10/15  6:27 PM  Result Value Ref Range Status   MRSA by PCR NEGATIVE NEGATIVE Final     Comment:        The GeneXpert MRSA Assay (FDA approved for NASAL specimens only), is one component of a comprehensive MRSA colonization surveillance program. It is not intended to diagnose MRSA infection nor to guide or monitor treatment for MRSA infections.   Culture, sputum-assessment     Status: None   Collection Time: 06/11/15  2:50 PM  Result Value Ref Range Status   Specimen Description SPUTUM  Final   Special Requests NONE  Final   Sputum evaluation THIS SPECIMEN IS ACCEPTABLE FOR SPUTUM CULTURE  Final   Report Status 06/13/2015 FINAL  Final  Culture, respiratory (NON-Expectorated)     Status: None (Preliminary result)   Collection Time: 06/11/15  2:50 PM  Result Value Ref Range Status   Specimen Description SPUTUM  Final   Special Requests NONE Reflexed from [id number]  Final   Gram Stain PENDING  Incomplete   Culture MODERATE GROWTH STAPHYLOCOCCUS AUREUS  Final   Report Status PENDING  Incomplete   Organism ID, Bacteria STAPHYLOCOCCUS AUREUS  Final  Susceptibility   Staphylococcus aureus - MIC*    CIPROFLOXACIN <=0.5 SENSITIVE Sensitive     GENTAMICIN <=0.5 SENSITIVE Sensitive     OXACILLIN 0.5 SENSITIVE Sensitive     TRIMETH/SULFA <=10 SENSITIVE Sensitive     CEFOXITIN SCREEN NEGATIVE Sensitive     Inducible Clindamycin NEGATIVE Sensitive     TETRACYCLINE Value in next row Sensitive      SENSITIVE<=1    * MODERATE GROWTH STAPHYLOCOCCUS AUREUS    IMAGING: Dg Abd 1 View  06/13/2015   CLINICAL DATA:  Status post OG tube placement.  EXAM: ABDOMEN - 1 VIEW  COMPARISON:  Single view of the abdomen 06/10/2015.  FINDINGS: OG tube is in place with the tip in the distal stomach. The bowel gas pattern is unremarkable.  IMPRESSION: OG tube tip is in the distal stomach.   Electronically Signed   By: Inge Rise M.D.   On: 06/13/2015 11:32   Dg Abd 1 View  06/10/2015   CLINICAL DATA:  64 year old male enteric tube placement. Initial encounter.  EXAM: ABDOMEN - 1 VIEW   COMPARISON:  Portable chest radiograph 1556 hours today.  FINDINGS: Portable AP supine view at 1552 hours. There is no enteric tube visible in the abdomen. There is gaseous distension of the stomach. Non obstructed bowel gas pattern otherwise. On the recent portable chest tubing was looped over the lower neck.  IMPRESSION: 1. Strongly suspect the enteric tube is looped in the pharynx, visible only on the portable chest radiograph from 1556 hours. Recommend removal and replacement. 2. Gaseous distension of the stomach.   Electronically Signed   By: Genevie Ann M.D.   On: 06/10/2015 16:32   Dg Chest Port 1 View  06/13/2015   CLINICAL DATA:  Hypoxia  EXAM: PORTABLE CHEST - 1 VIEW  COMPARISON:  June 12, 2015  FINDINGS: Endotracheal tube tip is at the carina. Central catheter tip is in the superior cava. Nasogastric tube tip and side port are below the diaphragm. No pneumothorax. There is alveolar edema throughout much of the right lung. There is also alveolar edema in both lower lobes. Heart is enlarged with pulmonary venous hypertension. Suspect small layering effusion on the right. No adenopathy.  IMPRESSION: Tube and catheter positions as described without pneumothorax. Note that the endotracheal tube tip is at the carina.  Congestive heart failure with persistent areas of alveolar edema. Stable cardiomegaly. Suspect layering effusion on the right.  Critical Value/emergent results were called by telephone at the time of interpretation on 06/13/2015 at 10:17 am to Delton See, RN , who verbally acknowledged these results.   Electronically Signed   By: Lowella Grip III M.D.   On: 06/13/2015 10:18   Dg Chest Port 1 View  06/12/2015   CLINICAL DATA:  Shortness of breath.  EXAM: PORTABLE CHEST - 1 VIEW  COMPARISON:  06/11/2015.  FINDINGS: Interim removal of endotracheal tube. Left subclavian line in good anatomic position. Cardiomegaly. Progressive bilateral pulmonary infiltrates. Small left pleural effusion. No  pneumothorax.  IMPRESSION: 1. Interim removal of endotracheal tube and NG tube. Left sub plate central line in stable position. 2. Cardiomegaly with pulmonary venous congestion and significant interim progression of bilateral pulmonary infiltrates. Persistent left pleural effusion. Findings are consistent with progressive congestive heart failure. Underlying pneumonia cannot be excluded.   Electronically Signed   By: Marcello Moores  Register   On: 06/12/2015 07:41   Dg Chest Port 1 View  06/11/2015   CLINICAL DATA:  Central line placement.  Initial  encounter.  EXAM: PORTABLE CHEST - 1 VIEW  COMPARISON:  Chest radiograph performed 06/10/2015  FINDINGS: The patient's endotracheal tube is seen ending 3 cm above the carina. A left subclavian line is noted ending about the distal SVC.  A small left pleural effusion is noted. Right upper lobe airspace opacification is concerning for pneumonia, similar in appearance to the recent prior study. Underlying vascular congestion is noted. No pneumothorax is seen  The cardiomediastinal silhouette is enlarged. No acute osseous abnormalities are identified.  IMPRESSION: 1. Endotracheal tube seen ending 3 cm above the carina. 2. Left subclavian line noted ending about the distal SVC. 3. Right upper lobe airspace opacification, compatible with pneumonia, similar in appearance to the prior study. 4. Small left pleural effusion noted. Cardiomegaly and vascular congestion.   Electronically Signed   By: Garald Balding M.D.   On: 06/11/2015 02:08   Dg Chest Port 1 View  06/10/2015   CLINICAL DATA:  Status post intubation  EXAM: PORTABLE CHEST - 1 VIEW  COMPARISON:  06/10/2015  FINDINGS: Cardiac shadow remains enlarged. A nasogastric catheter and endotracheal tube are again seen. The endotracheal tube is been withdrawn and now lies in satisfactory position. Increasing consolidation within the right upper lobe is noted. Some patchy changes are noted in the left lung base as well. The left  changes are stable from the recent exam.  IMPRESSION: Increasing right upper lobe consolidation.   Electronically Signed   By: Inez Catalina M.D.   On: 06/10/2015 19:05   Dg Chest Portable 1 View  06/10/2015   ADDENDUM REPORT: 06/10/2015 16:41  ADDENDUM: Study discussed by telephone with Dr. Hinda Kehr on 06/10/2015 at 1636 hours.  We also discussed that the enteric tube appears to be looped in the pharynx/neck on this image.  He advises that both the ET tube and enteric tube have been repositioned, and a repeat abdominal film is pending.   Electronically Signed   By: Genevie Ann M.D.   On: 06/10/2015 16:41   06/10/2015   CLINICAL DATA:  64 year old male intubated. Tachypnea. Initial encounter.  EXAM: PORTABLE CHEST - 1 VIEW  COMPARISON:  1442 hours today, and earlier  FINDINGS: Portable AP supine view at 1556 hours. Endotracheal tube tip at the carina. Decreased ventilation at the left lung base. Stable cardiomegaly and mediastinal contours. Mild crowding of markings elsewhere.  IMPRESSION: 1. Intubated, endotracheal tube tip at the carina. Retract up to 3 cm 4 Mar I optimal placement. 2. Left greater than right perihilar increased atelectasis.  Electronically Signed: By: Genevie Ann M.D. On: 06/10/2015 16:31   Dg Chest Port 1 View  06/10/2015   CLINICAL DATA:  Tachypnea, shallow breathing.  EXAM: PORTABLE CHEST - 1 VIEW  COMPARISON:  April 08, 2015.  FINDINGS: Stable cardiomegaly. No pneumothorax is noted. Increased central pulmonary vascular congestion is noted. Mild bibasilar opacities are noted concerning for subsegmental atelectasis, edema or effusions. Increased right upper lobe opacity is noted concerning for possible pneumonia or edema. Bony thorax is intact.  IMPRESSION: Cardiomegaly and increased central pulmonary vascular congestion is noted, with increased right upper lobe opacity concerning for pneumonia or possibly edema. Mild bibasilar opacities are noted concerning for subsegmental atelectasis or  edema with possible associated minimal pleural effusions.   Electronically Signed   By: Marijo Conception, M.D.   On: 06/10/2015 15:08   Dg Abd Portable 1v  06/10/2015   CLINICAL DATA:  OG tube placement.  EXAM: PORTABLE ABDOMEN - 1 VIEW 4:31  p.m.  COMPARISON:  06/10/2015 3:52 p.m.  FINDINGS: There is an OG tube with tip in the fundus of the stomach. Endotracheal tube appears in good position 3 cm above the carina.  No visible dilated bowel. Pulmonary infiltrates noted in the right upper lobe and left lower lobe.  IMPRESSION: OG tube tip in the fundus of the stomach.   Electronically Signed   By: Lorriane Shire M.D.   On: 06/10/2015 16:51    Assessment:   Aaron Harrison is a 64 y.o. male with a known history of A. fib, CHF, pneumonia, recurrent LE cellulitis. Admitted 8/14 with AMS, weakness, A fib RVR and respiratory failure.   He had cellulitis in June and had wound cx with Pseudomona, Proteus and MSSA.  Dced on bactrim and cipro - had unawraps and was following with CHF clinic and with vascular.   Since admit has had progressive multiorgan failure, is intubated, on CRRT.  Sputum cx this admission growing MSSA. [place] negative.  CXR on admit with RUL infiltrated.  No fevers, wbc normalized.  ID Problems MSSA Pneumonia- RU. Some concern for aspiration LE cellulitis - prior cx MSSA, pseudomonas, and proteus - not severe at this point but does have multiple wounds Sepsis with MOF Recommendations Agree with unasyn to cover MSSA< and aspiration PNA as well as LE cellulitis Culture R leg wound and L post calf wound- eval for resistant organisms  Thank you very much for allowing me to participate in the care of this patient. Please call with questions.   Cheral Marker. Ola Spurr, MD

## 2015-06-13 NOTE — Progress Notes (Signed)
[facility] NAME: Aaron Harrison    MR#:  [id number]  DATE OF BIRTH:  [birth year]  SUBJECTIVE:  CHIEF COMPLAINT:   Chief Complaint  Patient presents with  . Weakness    "not acting right" per family   extubated yesterday but got worse this morning with increasing work of breathing and lethargy which required emergent intubation, now on ventilator with heparin, amiodarone, fentanyl drips and tube feedings running  REVIEW OF SYSTEMS:  Review of Systems  Unable to perform ROS: critical illness   DRUG ALLERGIES:   Allergies  Allergen Reactions  . Penicillins Nausea And Vomiting   VITALS:  Blood pressure 95/69, pulse 77, temperature 96.1 F (35.6 C), temperature source Oral, resp. rate 24, height 5\' 8"  (1.727 m), weight 128 kg (282 lb 3 oz), SpO2 100 %. PHYSICAL EXAMINATION:  Physical Exam  Constitutional: He is well-developed, well-nourished, and in no distress. He appears toxic. He has a sickly appearance.  HENT:  Head: Normocephalic and atraumatic.  Endotracheal tube in place Orogastric tube in place   Eyes: Conjunctivae and EOM are normal. Pupils are equal, round, and reactive to light.  Neck: Normal range of motion. Neck supple. No tracheal deviation present. No thyromegaly present.  Cardiovascular: Normal rate, regular rhythm and normal heart sounds.   Pulmonary/Chest: Effort normal and breath sounds normal. No respiratory distress. He has no wheezes. He exhibits no tenderness.  Abdominal: Soft. Bowel sounds are normal. He exhibits no distension. There is no tenderness.  Musculoskeletal: Normal range of motion.  Neurological: He is disoriented. No cranial nerve deficit.  Sedated on ventilator  Skin: Skin is warm and dry. Lesion and rash noted. Rash is maculopapular.     Psychiatric:  Unable to assess - Sedated on ventilator   vascular access: Right femoral groin line placed on 17th of August for temporary  dialysis LABORATORY PANEL:   CBC  Recent Labs Lab 06/13/15 0143  WBC 8.4  HGB 12.2*  HCT 38.5*  PLT 85*   ------------------------------------------------------------------------------------------------------------------ Chemistries   Recent Labs Lab 06/11/15 1519  06/13/15 1230  NA 138  < > 136  K 4.8  < > 3.8  CL 106  < > 104  CO2 24  < > 22  GLUCOSE 109*  < > 108*  BUN 55*  < > 65*  CREATININE 2.64*  < > 3.80*  CALCIUM 7.9*  < > 7.5*  MG  --   < > 2.3  AST 108*  --   --   ALT 112*  --   --   ALKPHOS 68  --   --   BILITOT 5.4*  --   --   < > = values in this interval not displayed. RADIOLOGY:  Dg Abd 1 View  06/13/2015   CLINICAL DATA:  Status post OG tube placement.  EXAM: ABDOMEN - 1 VIEW  COMPARISON:  Single view of the abdomen 06/10/2015.  FINDINGS: OG tube is in place with the tip in the distal stomach. The bowel gas pattern is unremarkable.  IMPRESSION: OG tube tip is in the distal stomach.   Electronically Signed   By: Inge Rise M.D.   On: 06/13/2015 11:32   Dg Chest Port 1 View  06/13/2015   CLINICAL DATA:  Hypoxia  EXAM: PORTABLE CHEST - 1 VIEW  COMPARISON:  June 12, 2015  FINDINGS: Endotracheal tube tip is at the carina. Central catheter tip is in the superior cava. Nasogastric  tube tip and side port are below the diaphragm. No pneumothorax. There is alveolar edema throughout much of the right lung. There is also alveolar edema in both lower lobes. Heart is enlarged with pulmonary venous hypertension. Suspect small layering effusion on the right. No adenopathy.  IMPRESSION: Tube and catheter positions as described without pneumothorax. Note that the endotracheal tube tip is at the carina.  Congestive heart failure with persistent areas of alveolar edema. Stable cardiomegaly. Suspect layering effusion on the right.  Critical Value/emergent results were called by telephone at the time of interpretation on 06/13/2015 at 10:17 am to Delton See, RN , who  verbally acknowledged these results.   Electronically Signed   By: Lowella Grip III M.D.   On: 06/13/2015 10:18   ASSESSMENT AND PLAN:   * Acute respiratory failure with hypoxia: intubated in ED, extubated on August 15 - Reintubated this morning on August 17 due to increased work of breathing.  On heparin, amiodarone and fentanyl drip.  He is started on tube feeds at 20 cc an hour   * Sepsis with multiorgan failure: Likely hospital-acquired pneumonia: continue zosyn.  Infectious disease input appreciated    * MSSA Pneumonia- IV Unasyn  * Acute renal failure: Possible cardiorenal syndrome due to underlying CHF.  Appreciate nephrology input - temporary dialysis catheter placed in the right groin and CRRT started today  * Acute on chronic systolic CHF, ejection fraction 35%:  Unable to continue diuresis at this time due to  hypotension  Echo showing ejection fraction 30% to 35%  * A. fib with RVR:  Not able to use rate controlling medication due to hypotension In the past.  He had refused anticoagulation Continue aspirin for now  * Elevated troponin: Due to supply demand ischemia  * Bilateral lower extremity cellulitis: Continue Unasyn  * Hyperkalemia: Resolved  * Ascites/Abnormal liver function test: GI following, on lactulose  * Coagulopathy: Monitor coags   All the records are reviewed and case discussed with Care Management/Social Worker. Management plans discussed with the patient, family and they are in agreement.  CODE STATUS: Full code   TOTAL TIME (  critical care ) TAKING CARE OF THIS PATIENT: 35 minutes.   More than 50% of the time was spent in counseling/coordination of care: YES  POSSIBLE D/C IN 2-3 DAYS, DEPENDING ON CLINICAL CONDITION.   [facility], Kordel Leavy M.D on 06/13/2015 at 4:07 PM  Between 7am to 6pm - Pager - [phone]  After 6pm go to [email or web address] - password [email or web address] [facility]  [facility]  Office  [phone]  CC:  Primary care  physician; No PCP Per Patient

## 2015-06-13 NOTE — Progress Notes (Signed)
Per Dr Mortimer Fries reviewed KUB, ok to use Orogastric tube.

## 2015-06-13 NOTE — Procedures (Signed)
Central Venous Dailysis Catheter Placement: Indication: Hemo Dialysis/CRRT   Consent:emergent  Risks and benefits explained in detail including risk of infection, bleeding, respiratory failure and death..   Hand washing performed prior to starting the procedure.   Procedure: An active timeout was performed and correct patient, name, & ID confirmed.  After explaining risk and benefits, patient was positioned correctly for central venous access. Patient was prepped using strict sterile technique including chlorohexadine preps, sterile drape, sterile gown and sterile gloves.  The area was prepped, draped and anesthetized in the usual sterile manner. Patient comfort was obtained.  A triple lumen catheter was placed in RT femoral Vein There was good blood return, catheter caps were placed on lumens, catheter flushed easily, the line was secured and a sterile dressing and BIO-PATCH applied.   Ultrasound was used to visualize vasculature and guidance of needle.   Number of Attempts: 1 Complications:none  Estimated Blood Loss: none Chest Radiograph indicated and ordered.  Procedure Time: 15 mins Operator: Arli Bree.   Corrin Parker, M.D.  [facility]  Medical Director [place] Director [facility] Cardio-Pulmonary Department

## 2015-06-14 LAB — RENAL FUNCTION PANEL
ALBUMIN: 2.3 g/dL — AB (ref 3.5–5.0)
ALBUMIN: 2.5 g/dL — AB (ref 3.5–5.0)
ANION GAP: 8 (ref 5–15)
Albumin: 2.3 g/dL — ABNORMAL LOW (ref 3.5–5.0)
Albumin: 2.4 g/dL — ABNORMAL LOW (ref 3.5–5.0)
Albumin: 2.5 g/dL — ABNORMAL LOW (ref 3.5–5.0)
Albumin: 2.6 g/dL — ABNORMAL LOW (ref 3.5–5.0)
Anion gap: 8 (ref 5–15)
Anion gap: 7 (ref 5–15)
Anion gap: 7 (ref 5–15)
Anion gap: 9 (ref 5–15)
Anion gap: 7 (ref 5–15)
BUN: 30 mg/dL — AB (ref 6–20)
BUN: 33 mg/dL — ABNORMAL HIGH (ref 6–20)
BUN: 35 mg/dL — AB (ref 6–20)
BUN: 38 mg/dL — ABNORMAL HIGH (ref 6–20)
BUN: 41 mg/dL — AB (ref 6–20)
BUN: 48 mg/dL — AB (ref 6–20)
CALCIUM: 8 mg/dL — AB (ref 8.9–10.3)
CALCIUM: 7.8 mg/dL — AB (ref 8.9–10.3)
CHLORIDE: 109 mmol/L (ref 101–111)
CO2: 26 mmol/L (ref 22–32)
CO2: 25 mmol/L (ref 22–32)
CO2: 27 mmol/L (ref 22–32)
CO2: 26 mmol/L (ref 22–32)
CO2: 26 mmol/L (ref 22–32)
CO2: 28 mmol/L (ref 22–32)
CREATININE: 2.47 mg/dL — AB (ref 0.61–1.24)
CREATININE: 2.41 mg/dL — AB (ref 0.61–1.24)
CREATININE: 1.88 mg/dL — AB (ref 0.61–1.24)
Calcium: 7.9 mg/dL — ABNORMAL LOW (ref 8.9–10.3)
Calcium: 8 mg/dL — ABNORMAL LOW (ref 8.9–10.3)
Calcium: 8.2 mg/dL — ABNORMAL LOW (ref 8.9–10.3)
Calcium: 8.4 mg/dL — ABNORMAL LOW (ref 8.9–10.3)
Chloride: 108 mmol/L (ref 101–111)
Chloride: 108 mmol/L (ref 101–111)
Chloride: 109 mmol/L (ref 101–111)
Chloride: 109 mmol/L (ref 101–111)
Chloride: 109 mmol/L (ref 101–111)
Creatinine, Ser: 1.99 mg/dL — ABNORMAL HIGH (ref 0.61–1.24)
Creatinine, Ser: 2.08 mg/dL — ABNORMAL HIGH (ref 0.61–1.24)
Creatinine, Ser: 2.74 mg/dL — ABNORMAL HIGH (ref 0.61–1.24)
GFR calc Af Amer: 27 mL/min — ABNORMAL LOW (ref >60)
GFR calc Af Amer: 40 mL/min — ABNORMAL LOW
GFR calc Af Amer: 43 mL/min — ABNORMAL LOW (ref >60)
GFR calc non Af Amer: 23 mL/min — ABNORMAL LOW (ref >60)
GFR calc non Af Amer: 27 mL/min — ABNORMAL LOW (ref >60)
GFR calc non Af Amer: 34 mL/min — ABNORMAL LOW
GFR, EST AFRICAN AMERICAN: 31 mL/min — AB (ref >60)
GFR, EST AFRICAN AMERICAN: 31 mL/min — AB (ref >60)
GFR, EST AFRICAN AMERICAN: 38 mL/min — AB (ref >60)
GFR, EST NON AFRICAN AMERICAN: 26 mL/min — AB (ref >60)
GFR, EST NON AFRICAN AMERICAN: 32 mL/min — AB (ref >60)
GFR, EST NON AFRICAN AMERICAN: 37 mL/min — AB (ref >60)
GLUCOSE: 118 mg/dL — AB (ref 65–99)
Glucose, Bld: 115 mg/dL — ABNORMAL HIGH (ref 65–99)
Glucose, Bld: 117 mg/dL — ABNORMAL HIGH (ref 65–99)
Glucose, Bld: 117 mg/dL — ABNORMAL HIGH (ref 65–99)
Glucose, Bld: 104 mg/dL — ABNORMAL HIGH (ref 65–99)
Glucose, Bld: 94 mg/dL (ref 65–99)
PHOSPHORUS: 2.9 mg/dL (ref 2.5–4.6)
PHOSPHORUS: 2.7 mg/dL (ref 2.5–4.6)
POTASSIUM: 3.5 mmol/L (ref 3.5–5.1)
POTASSIUM: 3.6 mmol/L (ref 3.5–5.1)
Phosphorus: 2.7 mg/dL (ref 2.5–4.6)
Phosphorus: 2.8 mg/dL (ref 2.5–4.6)
Phosphorus: 2.8 mg/dL (ref 2.5–4.6)
Phosphorus: 3.2 mg/dL (ref 2.5–4.6)
Potassium: 3.4 mmol/L — ABNORMAL LOW (ref 3.5–5.1)
Potassium: 3.4 mmol/L — ABNORMAL LOW (ref 3.5–5.1)
Potassium: 3.6 mmol/L (ref 3.5–5.1)
Potassium: 3.7 mmol/L (ref 3.5–5.1)
SODIUM: 142 mmol/L (ref 135–145)
SODIUM: 142 mmol/L (ref 135–145)
SODIUM: 142 mmol/L (ref 135–145)
Sodium: 143 mmol/L (ref 135–145)
Sodium: 144 mmol/L (ref 135–145)
Sodium: 143 mmol/L (ref 135–145)

## 2015-06-14 LAB — CBC
HCT: 32.9 % — ABNORMAL LOW (ref 40.0–52.0)
HEMOGLOBIN: 10.7 g/dL — AB (ref 13.0–18.0)
MCH: 33.6 pg (ref 26.0–34.0)
MCHC: 32.5 g/dL (ref 32.0–36.0)
MCV: 103.3 fL — ABNORMAL HIGH (ref 80.0–100.0)
PLATELETS: 57 10*3/uL — AB (ref 150–440)
RBC: 3.19 MIL/uL — ABNORMAL LOW (ref 4.40–5.90)
RDW: 16.8 % — AB (ref 11.5–14.5)
WBC: 5.9 10*3/uL (ref 3.8–10.6)

## 2015-06-14 LAB — BLOOD GAS, ARTERIAL
ACID-BASE DEFICIT: 0.2 mmol/L (ref 0.0–2.0)
Allens test (pass/fail): POSITIVE — AB
BICARBONATE: 25.4 meq/L (ref 21.0–28.0)
FIO2: 35
LHR: 24 {breaths}/min
MECHVT: 500 mL
Mechanical Rate: 24
O2 SAT: 95.5 %
PEEP/CPAP: 5 cmH2O
PH ART: 7.37 (ref 7.350–7.450)
Patient temperature: 37
pCO2 arterial: 44 mmHg (ref 32.0–48.0)
pO2, Arterial: 81 mmHg — ABNORMAL LOW (ref 83.0–108.0)

## 2015-06-14 LAB — GLUCOSE, CAPILLARY
GLUCOSE-CAPILLARY: 76 mg/dL (ref 65–99)
Glucose-Capillary: 128 mg/dL — ABNORMAL HIGH (ref 65–99)
Glucose-Capillary: 66 mg/dL (ref 65–99)
Glucose-Capillary: 80 mg/dL (ref 65–99)
Glucose-Capillary: 89 mg/dL (ref 65–99)
Glucose-Capillary: 91 mg/dL (ref 65–99)
Glucose-Capillary: 94 mg/dL (ref 65–99)

## 2015-06-14 LAB — HEPARIN LEVEL (UNFRACTIONATED): Heparin Unfractionated: 0.34 IU/mL (ref 0.30–0.70)

## 2015-06-14 LAB — CULTURE, RESPIRATORY

## 2015-06-14 LAB — MAGNESIUM
MAGNESIUM: 1.9 mg/dL (ref 1.7–2.4)
Magnesium: 2.1 mg/dL (ref 1.7–2.4)
Magnesium: 1.9 mg/dL (ref 1.7–2.4)
Magnesium: 2 mg/dL (ref 1.7–2.4)
Magnesium: 2 mg/dL (ref 1.7–2.4)

## 2015-06-14 LAB — CULTURE, RESPIRATORY W GRAM STAIN

## 2015-06-14 MED ORDER — NOREPINEPHRINE 4 MG/250ML-% IV SOLN: 0.0000 ug/min | INTRAVENOUS | Status: DC

## 2015-06-14 MED ORDER — DEXTROSE 5 % IV SOLN: 0.0000 ug/min | INTRAVENOUS | Status: DC

## 2015-06-14 MED ORDER — HEPARIN SODIUM (PORCINE) 5000 UNIT/ML IJ SOLN
5000.0000 [IU] | Freq: Three times a day (TID) | INTRAMUSCULAR | Status: DC
  Administered 2015-06-14 – 2015-06-15 (×5): 5000 [IU] via SUBCUTANEOUS
  Filled 2015-06-14 (×5): qty 1

## 2015-06-14 MED ORDER — FAMOTIDINE IN NACL 20-0.9 MG/50ML-% IV SOLN
20.0000 mg | INTRAVENOUS | Status: DC
  Administered 2015-06-14: 20 mg via INTRAVENOUS
  Filled 2015-06-14 (×3): qty 50

## 2015-06-14 NOTE — Consult Note (Signed)
PULMONARY / CRITICAL CARE MEDICINE   Name: Aaron Harrison MRN: [id number] DOB: 01-13-1951    ADMISSION DATE:  06/10/2015   CHIEF COMPLAINT:   Acute resp failure   HISTORY OF PRESENT ILLNESS  Patient remains intubated, sedated, on full vent support, on CRRT  Patient with  Multiorgan failure   SIGNIFICANT EVENTS   Intubated 8/14 Extubated 8/15 Intubated 8/17    PAST MEDICAL HISTORY    :  Past Medical History  Diagnosis Date  . Chronic atrial fibrillation     a. not on long term anticoagulation  . Pneumonia 2011  . Chronic systolic CHF (congestive heart failure)   . Cellulitis    History reviewed. No pertinent past surgical history. Prior to Admission medications   Medication Sig Start Date End Date Taking? Authorizing Provider  aspirin EC 81 MG tablet Take 81 mg by mouth daily.    Historical Provider, MD  furosemide (LASIX) 20 MG tablet Take 1 tablet (20 mg total) by mouth daily. Patient not taking: Reported on 05/11/2015 04/11/15   Max Sane, MD   Allergies  Allergen Reactions  . Penicillins Nausea And Vomiting     FAMILY HISTORY   Family History  Problem Relation Age of Onset  . Diabetes Mother   . Stroke Mother   . Heart failure Father       SOCIAL HISTORY    reports that he has never smoked. He has never used smokeless tobacco. He reports that he does not drink alcohol or use illicit drugs.  Review of Systems  Unable to perform ROS: critical illness      VITAL SIGNS    Temp:  [96.1 F (35.6 C)-97.8 F (36.6 C)] 97.5 F (36.4 C) (08/18 0800) Pulse Rate:  [25-97] 83 (08/18 0800) Resp:  [13-24] 24 (08/18 0800) BP: (71-140)/(50-93) 100/58 mmHg (08/18 0800) SpO2:  [91 %-100 %] 95 % (08/18 0800) FiO2 (%):  [35 %-40 %] 35 % (08/18 0800) Weight:  [278 lb 7.1 oz (126.3 kg)] 278 lb 7.1 oz (126.3 kg) (08/18 0500) HEMODYNAMICS:   VENTILATOR SETTINGS: Vent Mode:  [-] PRVC FiO2 (%):  [35 %-40 %] 35 % Set Rate:  [24 bmp] 24 bmp Vt Set:  [500  mL] 500 mL PEEP:  [5 cmH20] 5 cmH20 INTAKE / OUTPUT:  Intake/Output Summary (Last 24 hours) at 06/14/15 0845 Last data filed at 06/14/15 0800  Gross per 24 hour  Intake 9502.65 ml  Output   1246 ml  Net 8256.65 ml       PHYSICAL EXAM   Physical Exam  Constitutional: He appears distressed.  HENT:  Head: Normocephalic and atraumatic.  Eyes: Pupils are equal, round, and reactive to light. No scleral icterus.  Neck: Normal range of motion. Neck supple.  Cardiovascular: Normal rate and regular rhythm.   No murmur heard. Pulmonary/Chest: He is in respiratory distress. He has no wheezes. He has rales.  resp distress  Abdominal: Soft. He exhibits no distension. There is no tenderness.  Musculoskeletal: He exhibits no edema.  Neurological: He displays normal reflexes. Coordination normal.  Lethargic, GCS8T,  Skin: Skin is warm. No rash noted. He is diaphoretic.       LABS   LABS:  CBC  Recent Labs Lab 06/12/15 0300 06/13/15 0143 06/14/15 0440  WBC 10.6 8.4 5.9  HGB 12.9* 12.2* 10.7*  HCT 40.0 38.5* 32.9*  PLT 98* 85* 57*   Coag's  Recent Labs Lab 06/10/15 1435  APTT 32  INR 2.00   BMET  Recent Labs Lab 06/13/15 2132 06/14/15 0010 06/14/15 0440  NA 141 142 142  K 3.5 3.6 3.4*  CL 109 109 108  CO2 23 25 26   BUN 50* 48* 41*  CREATININE 2.92* 2.74* 2.47*  GLUCOSE 107* 118* 115*   Electrolytes  Recent Labs Lab 06/13/15 1627 06/13/15 2132 06/14/15 0010 06/14/15 0440  CALCIUM  --  7.9* 7.9* 8.0*  MG 2.2  --  2.1 1.9  PHOS  --  3.4 3.2 2.9   Sepsis Markers  Recent Labs Lab 06/10/15 1435 06/10/15 1837  LATICACIDVEN 2.1* 4.0*   ABG  Recent Labs Lab 06/10/15 1640 06/10/15 1930 06/13/15 0955  PHART 7.13* 7.29* 7.22*  PCO2ART 69* 45 55*  PO2ART 69* 185* 117*   Liver Enzymes  Recent Labs Lab 06/10/15 1435 06/11/15 1519  06/13/15 2132 06/14/15 0010 06/14/15 0440  AST 301* 108*  --   --   --   --   ALT 150* 112*  --   --   --    --   ALKPHOS 87 68  --   --   --   --   BILITOT 6.7* 5.4*  --   --   --   --   ALBUMIN 3.5 2.8*  < > 2.3* 2.5* 2.3*  < > = values in this interval not displayed. Cardiac Enzymes  Recent Labs Lab 06/10/15 1435 06/11/15 0040 06/11/15 0312  TROPONINI 0.20* 0.22* 0.27*   Glucose  Recent Labs Lab 06/13/15 0732 06/13/15 1206 06/13/15 1612 06/13/15 2021 06/14/15 0002 06/14/15 0759  GLUCAP 97 84 90 91 94 128*     Recent Results (from the past 240 hour(s))  Blood Culture (routine x 2)     Status: None (Preliminary result)   Collection Time: 06/10/15  2:35 PM  Result Value Ref Range Status   Specimen Description BLOOD RIGHT WRIST  Final   Special Requests BOTTLES DRAWN AEROBIC AND ANAEROBIC  1CC  Final   Culture NO GROWTH 3 DAYS  Final   Report Status PENDING  Incomplete  Blood Culture (routine x 2)     Status: None (Preliminary result)   Collection Time: 06/10/15  2:40 PM  Result Value Ref Range Status   Specimen Description BLOOD LEFT ASSIST CONTROL  Final   Special Requests   Final    BOTTLES DRAWN AEROBIC AND ANAEROBIC  AER 6CC ANA 4CC   Culture NO GROWTH 3 DAYS  Final   Report Status PENDING  Incomplete  Urine culture     Status: None   Collection Time: 06/10/15  4:42 PM  Result Value Ref Range Status   Specimen Description URINE, RANDOM  Final   Special Requests NONE  Final   Culture 4,000 COLONIES/mL INSIGNIFICANT GROWTH  Final   Report Status 06/12/2015 FINAL  Final  MRSA PCR Screening     Status: None   Collection Time: 06/10/15  6:27 PM  Result Value Ref Range Status   MRSA by PCR NEGATIVE NEGATIVE Final    Comment:        The GeneXpert MRSA Assay (FDA approved for NASAL specimens only), is one component of a comprehensive MRSA colonization surveillance program. It is not intended to diagnose MRSA infection nor to guide or monitor treatment for MRSA infections.   Culture, sputum-assessment     Status: None   Collection Time: 06/11/15  2:50 PM   Result Value Ref Range Status   Specimen Description SPUTUM  Final   Special Requests NONE  Final  Sputum evaluation THIS SPECIMEN IS ACCEPTABLE FOR SPUTUM CULTURE  Final   Report Status 06/13/2015 FINAL  Final  Culture, respiratory (NON-Expectorated)     Status: None (Preliminary result)   Collection Time: 06/11/15  2:50 PM  Result Value Ref Range Status   Specimen Description SPUTUM  Final   Special Requests NONE Reflexed from [id number]  Final   Gram Stain PENDING  Incomplete   Culture MODERATE GROWTH STAPHYLOCOCCUS AUREUS  Final   Report Status PENDING  Incomplete   Organism ID, Bacteria STAPHYLOCOCCUS AUREUS  Final      Susceptibility   Staphylococcus aureus - MIC*    CIPROFLOXACIN <=0.5 SENSITIVE Sensitive     GENTAMICIN <=0.5 SENSITIVE Sensitive     OXACILLIN 0.5 SENSITIVE Sensitive     TRIMETH/SULFA <=10 SENSITIVE Sensitive     CEFOXITIN SCREEN NEGATIVE Sensitive     Inducible Clindamycin NEGATIVE Sensitive     TETRACYCLINE Value in next row Sensitive      SENSITIVE<=1    * MODERATE GROWTH STAPHYLOCOCCUS AUREUS     Current facility-administered medications:  .  [COMPLETED] amiodarone (NEXTERONE) 1.8 mg/mL load via infusion 150 mg, 150 mg, Intravenous, Once, 150 mg at 06/11/15 2001 **FOLLOWED BY** [EXPIRED] amiodarone (NEXTERONE PREMIX) 360 MG/200ML (1.8 mg/mL) IV infusion, 60 mg/hr, Intravenous, Continuous, Last Rate: 33.3 mL/hr at 06/11/15 2330, 60 mg/hr at 06/11/15 2330 **FOLLOWED BY** amiodarone (NEXTERONE PREMIX) 360 MG/200ML (1.8 mg/mL) IV infusion, 30 mg/hr, Intravenous, Continuous, Wellington Hampshire, MD, Last Rate: 16.7 mL/hr at 06/13/15 2140, 30 mg/hr at 06/13/15 2140 .  ammonium lactate (LAC-HYDRIN) 12 % lotion, , Topical, PRN, Florene Glen, MD .  Ampicillin-Sulbactam (UNASYN) 3 g in sodium chloride 0.9 % 100 mL IVPB, 3 g, Intravenous, Q6H, Flora Lipps, MD, 3 g at 06/14/15 0516 .  antiseptic oral rinse solution (CORINZ), 7 mL, Mouth Rinse, QID, Demetrios Loll, MD, 7 mL  at 06/14/15 0400 .  budesonide (PULMICORT) nebulizer solution 0.5 mg, 0.5 mg, Nebulization, BID, Flora Lipps, MD, 0.5 mg at 06/14/15 0759 .  chlorhexidine gluconate (PERIDEX) 0.12 % solution 15 mL, 15 mL, Mouth Rinse, BID, Demetrios Loll, MD, 15 mL at 06/13/15 2200 .  feeding supplement (PRO-STAT SUGAR FREE 64) liquid 30 mL, 30 mL, Oral, BID, Flora Lipps, MD .  feeding supplement (VITAL HIGH PROTEIN) liquid 1,000 mL, 1,000 mL, Per Tube, Q24H, Flora Lipps, MD, 1,000 mL at 06/13/15 1115 .  fentaNYL 2562mcg in NS 292mL (76mcg/ml) infusion-PREMIX, 10 mcg/hr, Intravenous, Continuous, Flora Lipps, MD, Last Rate: 20 mL/hr at 06/14/15 0800, 200 mcg/hr at 06/14/15 0800 .  free water 25 mL, 25 mL, Per Tube, 6 times per day, Flora Lipps, MD, 25 mL at 06/14/15 0800 .  heparin injection 1,000-6,000 Units, 1,000-6,000 Units, CRRT, PRN, Munsoor Lateef, MD .  heparin injection 5,000 Units, 5,000 Units, Subcutaneous, 3 times per day, Fritzi Mandes, MD, 5,000 Units at 06/14/15 0827 .  ipratropium-albuterol (DUONEB) 0.5-2.5 (3) MG/3ML nebulizer solution 3 mL, 3 mL, Nebulization, Q6H, Flora Lipps, MD, 3 mL at 06/14/15 0759 .  lactulose (CHRONULAC) 10 GM/15ML solution 30 g, 30 g, Oral, BID, Flora Lipps, MD, 30 g at 06/13/15 2141 .  midazolam (VERSED) injection 2-4 mg, 2-4 mg, Intravenous, Q1H PRN, Flora Lipps, MD, 2 mg at 06/14/15 0751 .  pureflow IV solution for Dialysis, , CRRT, Continuous, Munsoor Lateef, MD, Last Rate: 2,500 mL/hr at 06/14/15 0330 .  rocuronium (ZEMURON) injection, , Intravenous, PRN, Hinda Kehr, MD, 100 mg at 06/10/15 1531  IMAGING  Dg Abd 1 View  06/13/2015   CLINICAL DATA:  Status post OG tube placement.  EXAM: ABDOMEN - 1 VIEW  COMPARISON:  Single view of the abdomen 06/10/2015.  FINDINGS: OG tube is in place with the tip in the distal stomach. The bowel gas pattern is unremarkable.  IMPRESSION: OG tube tip is in the distal stomach.   Electronically Signed   By: Inge Rise M.D.   On:  06/13/2015 11:32   Dg Chest Port 1 View  06/13/2015   CLINICAL DATA:  Hypoxia  EXAM: PORTABLE CHEST - 1 VIEW  COMPARISON:  June 12, 2015  FINDINGS: Endotracheal tube tip is at the carina. Central catheter tip is in the superior cava. Nasogastric tube tip and side port are below the diaphragm. No pneumothorax. There is alveolar edema throughout much of the right lung. There is also alveolar edema in both lower lobes. Heart is enlarged with pulmonary venous hypertension. Suspect small layering effusion on the right. No adenopathy.  IMPRESSION: Tube and catheter positions as described without pneumothorax. Note that the endotracheal tube tip is at the carina.  Congestive heart failure with persistent areas of alveolar edema. Stable cardiomegaly. Suspect layering effusion on the right.  Critical Value/emergent results were called by telephone at the time of interpretation on 06/13/2015 at 10:17 am to Delton See, RN , who verbally acknowledged these results.   Electronically Signed   By: Lowella Grip III M.D.   On: 06/13/2015 10:18      Indwelling Urinary Catheter continued, requirement due to   Reason to continue Indwelling Urinary Catheter for strict Intake/Output monitoring for hemodynamic instability   Central Line continued, requirement due to   Reason to continue [employer] Monitoring of central venous pressure or other hemodynamic parameters       MAJOR EVENTS/TEST RESULTS: 8/14 intubated 8/15 extubated 8/17 re-intubated 8/17 start CRRT   INDWELLING DEVICES::LT subclavian vein 8/14 MICRO DATA: MRSA PCR>> NEG 8/14 Urine  Blood Resp   ANTIMICROBIALS: unasyn   ASSESSMENT/PLAN   64 yo white male admitted to ICU for acute resp hypercapnic adn hypoxic resp failure from acute CHF exacerbation with acute liver dysfunction/liver failure with acute renal failure with acute encephalopathy with mixed resp and metabolic acidosis with recurrent resp failure Patient emergently  intubated for acute CHF, and acidosis with worsening renal failure with lower ext cellulitis  PULMONARY 1.Respiratory Failure-patient reintubated a second time due to worsening cardio-renal syndrome -continue Full MV support -continue Bronchodilator Therapy -Wean Fio2 and PEEP as tolerated  CARDIOVASCULAR ACUTE CHF -intubated -continue vent support  RENAL Renal Failure-most likely due to ATN -follow chem 7 -follow UO -continue Foley Catheter-assess need -vasc cath placed for CRRT-nephrology consulted   GASTROINTESTINAL GI Prophylaxic -OG placed-on  TF's  HEMATOLOGIC Follow h/h  INFECTIOUS ?cellulitis of LE -on unasyn All cultures neg to date  ENDOCRINE - ICU hypoglycemic\Hyperglycemia protocol   NEUROLOGIC - intubated and sedated - minimal sedation to achieve a RASS goal: -1      I have personally obtained a history, examined the patient, evaluated laboratory and independently reviewed  imaging results, formulated the assessment and plan and placed orders.  The Patient requires high complexity decision making for assessment and support, frequent evaluation and titration of therapies, application of advanced monitoring technologies and extensive interpretation of multiple databases. Critical Care Time devoted to patient care services described in this note is 35 minutes.   Overall, patient is critically ill, prognosis is guarded. Patient at high risk for cardiac  arrest and death.  Patient with multiorgan failure-liver, cardiac,resp,renal.   Corrin Parker, M.D.  [facility]  Medical Director [place] Director [place] Department

## 2015-06-14 NOTE — Progress Notes (Signed)
RN notified Dr Mortimer Fries that Neprologist has increased Ultrafiltration rate,  MD gave order for Levophed gtt to maintain map of 65.

## 2015-06-14 NOTE — Progress Notes (Signed)
[facility] [place] NAME: Aaron Harrison    MR#:  [id number]  DATE OF BIRTH:  05/24/1951  SUBJECTIVE:  Remains intubated and on the vent. On IV Amiodarone,Heparin gtt,IV fentanyl. Sedated No fever  CRRT being tolerated TF+  REVIEW OF SYSTEMS:   Review of Systems  Unable to perform ROS: intubated   Tolerating Diet:TF Tolerating PT: no,on vent  DRUG ALLERGIES:   Allergies  Allergen Reactions  . Penicillins Nausea And Vomiting    VITALS:  Blood pressure 98/70, pulse 93, temperature 97.7 F (36.5 C), temperature source Oral, resp. rate 24, height 5\' 8"  (1.727 m), weight 126.3 kg (278 lb 7.1 oz), SpO2 100 %.  PHYSICAL EXAMINATION:   Physical Exam  GENERAL:  64 y.o.-year-old patient lying in the bed with no acute distress. critically ill anasarc EYES: Pupils equal, round, reactive to light and accommodation. No scleral icterus. Extraocular muscles intact.  HEENT: Head atraumatic, normocephalic. Oropharynx and nasopharynx clear.  -orally intubated NECK:  Supple, no jugular venous distention. No thyroid enlargement, no tenderness.  LUNGS: Normal breath sounds bilaterally, no wheezing, rales, rhonchi. No use of accessory muscles of respiration. Left upper chest CL+ CARDIOVASCULAR: S1, S2 normal. No murmurs, rubs, or gallops.  ABDOMEN: Soft, nontender, nondistended. Bowel sounds present. No organomegaly or mass.  -severe scrotal edema EXTREMITIES: bilateral ++++ edema b/l with weeping skin ulcers on tibial shin NEUROLOGIC: sedated and on the vent PSYCHIATRIC: on the vent SKIN:bilateral LE cellulitis and weeping skin ulcers over the tibial shin  LABORATORY PANEL:   CBC  Recent Labs Lab 06/14/15 0440  WBC 5.9  HGB 10.7*  HCT 32.9*  PLT 57*    Chemistries   Recent Labs Lab 06/11/15 1519  06/14/15 0440  NA 138  < > 142  K 4.8  < > 3.4*  CL 106  < > 108  CO2 24  < > 26  GLUCOSE 109*  < > 115*  BUN 55*  < > 41*   CREATININE 2.64*  < > 2.47*  CALCIUM 7.9*  < > 8.0*  MG  --   < > 1.9  AST 108*  --   --   ALT 112*  --   --   ALKPHOS 68  --   --   BILITOT 5.4*  --   --   < > = values in this interval not displayed.  Cardiac Enzymes  Recent Labs Lab 06/11/15 0312  TROPONINI 0.27*    RADIOLOGY:  Dg Abd 1 View  06/13/2015   CLINICAL DATA:  Status post OG tube placement.  EXAM: ABDOMEN - 1 VIEW  COMPARISON:  Single view of the abdomen 06/10/2015.  FINDINGS: OG tube is in place with the tip in the distal stomach. The bowel gas pattern is unremarkable.  IMPRESSION: OG tube tip is in the distal stomach.   Electronically Signed   By: Inge Rise M.D.   On: 06/13/2015 11:32   Dg Chest Port 1 View  06/13/2015   CLINICAL DATA:  Hypoxia  EXAM: PORTABLE CHEST - 1 VIEW  COMPARISON:  June 12, 2015  FINDINGS: Endotracheal tube tip is at the carina. Central catheter tip is in the superior cava. Nasogastric tube tip and side port are below the diaphragm. No pneumothorax. There is alveolar edema throughout much of the right lung. There is also alveolar edema in both lower lobes. Heart is enlarged with pulmonary venous hypertension. Suspect small layering effusion on the right. No  adenopathy.  IMPRESSION: Tube and catheter positions as described without pneumothorax. Note that the endotracheal tube tip is at the carina.  Congestive heart failure with persistent areas of alveolar edema. Stable cardiomegaly. Suspect layering effusion on the right.  Critical Value/emergent results were called by telephone at the time of interpretation on 06/13/2015 at 10:17 am to Delton See, RN , who verbally acknowledged these results.   Electronically Signed   By: Lowella Grip III M.D.   On: 06/13/2015 10:18     ASSESSMENT AND PLAN:  64 y.o. male with a known history of A. fib, CHF came in with  * Acute respiratory failure with hypoxia: intubated in ED, extubated on August 15 - Reintubated on August 17 due to increased  work of breathing. - On heparin, amiodarone and fentanyl drip.  * Sepsis with multiorgan failure: Likely hospital-acquired pneumonia: continue Unasyn  Infectious disease input appreciated  -BC negative -wbc stable -no fever  * MSSA Pneumonia- IV Unasyn  * Acute renal failure: Possible cardiorenal syndrome due to underlying CHF. Appreciate nephrology input - temporary dialysis catheter placed in the right groin and CRRT started 8/17  * Acute on chronic systolic CHF, ejection fraction 35%:  Unable to continue diuresis at this time due to hypotension  Echo showing ejection fraction 30% to 35% -CRRT with some UF -positive by 31K L, severe anasarca  * A. fib with RVR:  Not able to use rate controlling medication due to hypotension. On IV amiodaronegtt -on heparin gtt(not sure who placed the order). No mention of need for heparin gttt per cardiology notes -will d/c gtt -In the past pt had refused anticoagulation -Continue aspirin for now  * Elevated troponin: Due to supply demand ischemia  * Bilateral lower extremity cellulitis: Continue Unasyn  * Hyperkalemia: Resolved  * Ascites/Abnormal liver function test: GI following, on lactulose  *palliatvei care consult noted. Pt has a very poor prognosis given multiorgan failure.no family. Has 2 friends.  Case discussed with Care Management/Social Worker. Management plans discussed with the patient, family and they are in agreement.  CODE STATUS: Full DVT Prophylaxis: sq heparin  TOTAL Critical TIME TAKING CARE OF THIS PATIENT: 78minutes.  >50% time spent on counselling and coordination of care    Noemie Devivo M.D on 06/14/2015 at 7:11 AM  Between 7am to 6pm - Pager - [phone]  After 6pm go to [email or web address] - password [email or web address] [facility]  [facility]  Office  [phone]  CC: Primary care physician; No PCP Per Patient

## 2015-06-14 NOTE — Progress Notes (Signed)
Nutrition Follow-up   INTERVENTION:   EN: continue to titrate TF as tolerated to goal rate as per order set; continue minimal free water flushes as pt with volume overload per MD note  NUTRITION DIAGNOSIS:   Inadequate oral intake related to acute illness as evidenced by NPO status.  GOAL:   Patient will meet greater than or equal to 90% of their needs  MONITOR:    (Energy Intake, EN, Digestive System, Electrolyte/Renal Profile, Glucose Profile)   ASSESSMENT:   Pt remains on vent, on CRRT with UF increased to 125 ml/hr this AM   Diet Order:  Diet NPO time specified  EN: tolerating Vital High Protein at rate of 40 ml/hr  Digestive System: no signs of TF intolerance  Skin:  Reviewed, no issues  Last BM:  8/18   Electrolyte/Renal Profile:  Recent Labs Lab 06/14/15 0010 06/14/15 0440 06/14/15 0805  NA 142 142 142  K 3.6 3.4* 3.4*  CL 109 108 109  CO2 25 26 26   BUN 48* 41* 38*  CREATININE 2.74* 2.47* 2.41*  CALCIUM 7.9* 8.0* 7.8*  MG 2.1 1.9 1.9  PHOS 3.2 2.9 2.8  GLUCOSE 118* 115* 117*  Corrected calcium wdl  Glucose Profile:  Recent Labs  06/13/15 2021 06/14/15 0002 06/14/15 0759  GLUCAP 91 94 128*   Protein Profile:  Recent Labs Lab 06/14/15 0010 06/14/15 0440 06/14/15 0805  ALBUMIN 2.5* 2.3* 2.3*   Meds: lactulose, levophed  Height:   Ht Readings from Last 1 Encounters:  06/13/15 5\' 8"  (1.727 m)    Weight:   Wt Readings from Last 1 Encounters:  06/14/15 278 lb 7.1 oz (126.3 kg)    Filed Weights   06/12/15 0357 06/13/15 0359 06/14/15 0500  Weight: 273 lb 13 oz (124.2 kg) 282 lb 3 oz (128 kg) 278 lb 7.1 oz (126.3 kg)    BMI:  Body mass index is 42.35 kg/(m^2).  Estimated Nutritional Needs:   Kcal:  8756-4332 kcals (11-14 kcals/kg)   Protein:  140-175 g (2.0-2.5 g/kg)   Fluid:  1750-2100 mL (25-30 ml/kg)   HIGH Care Level  Kerman Passey MS, RD, LDN [phone] Pager

## 2015-06-14 NOTE — Progress Notes (Signed)
Patient remains on ventilator, settings unchanged.  Remains on CRRT on CVVHD with ultrafiltration at 125, tolerating well with machine operating without difficulty.  Patient able to follows command during wakeup assessment.  Currently sedated on fentanyl.  Amio gtt continues at 16.6 for afib, HR controlled.   Vital signs stable, no pressors needed at this time.   Foley in place and patent with oliguric urine.  Renal function improving.  Rectal tube with 758ml with liquid stool.  Tube feeding at goal rate of 78ml/hr with 84ml flush q4hr, last residual at 120.  Patient currently in no apparent distress.  See CHL for further details.

## 2015-06-14 NOTE — Progress Notes (Signed)
RN notified Dr Azell Der of patient with large liquid stools via rectal tube, patient receiving lactulose and receiving tube feeding, RN wants to know if MD wants to check for c-diff.   MD states "no need to check for c-diff, just discontinue the lactulose order."

## 2015-06-14 NOTE — Progress Notes (Signed)
Nephrologist, Dr Holley Raring in room, gave order to increase Ultrafiltration rate to 125, MD states "go ahead and go up to 125 now, I will change the order in the computer, you can just go up on his vasopressor if his bp drops"  RN notified MD that patient's vasopessor was discontinued this am.  MD states "check with Kasa what he wants to order."

## 2015-06-14 NOTE — Progress Notes (Signed)
[facility]  ROUNDING NOTE   Subjective:  Pt remains critically ill. Still on vent.  Still quite volume overloaded.  Discussed with nursing. We decided to add UF of 125cc.   Objective:  Vital signs in last 24 hours:  Temp:  [96.1 F (35.6 C)-97.8 F (36.6 C)] 97.5 F (36.4 C) (08/18 0800) Pulse Rate:  [25-97] 83 (08/18 0800) Resp:  [13-24] 24 (08/18 0800) BP: (87-140)/(54-93) 100/58 mmHg (08/18 0800) SpO2:  [91 %-100 %] 95 % (08/18 0800) FiO2 (%):  [35 %-40 %] 35 % (08/18 0800) Weight:  [126.3 kg (278 lb 7.1 oz)] 126.3 kg (278 lb 7.1 oz) (08/18 0500)  Weight change: -1.7 kg (-3 lb 12 oz) Filed Weights   06/12/15 0357 06/13/15 0359 06/14/15 0500  Weight: 124.2 kg (273 lb 13 oz) 128 kg (282 lb 3 oz) 126.3 kg (278 lb 7.1 oz)    Intake/Output: I/O last 3 completed shifts: In: 30117.6 [I.V.:29467.6; NG/GT:50; IV Piggyback:600] Out: 1610 [Urine:205; Other:979; Stool:1]   Intake/Output this shift:  Total I/O In: -  Out: 61 [Urine:15; Other:46]  Physical Exam: General: Critically ill appearing  Head: Normocephalic, atraumatic. ETT in place  Eyes: Eyes closed  Neck: Supple, trachea midline  Lungs:  Bilateral rhonchi vent assisted fio2 35%  Heart: Irregular S1S2 no rubs  Abdomen:  Soft, nontender, mild distension  Extremities:  3+ peripheral edema.  Neurologic: Intubated/sedated  Skin: ulcertaion  Access: Femoral dialysis catheter    Basic Metabolic Panel:  Recent Labs Lab 06/13/15 1230 06/13/15 1626 06/13/15 1627 06/13/15 2132 06/14/15 0010 06/14/15 0440 06/14/15 0805  NA 136 136  --  141 142 142 142  K 3.8 3.5  --  3.5 3.6 3.4* 3.4*  CL 104 104  --  109 109 108 109  CO2 22 23  --  23 25 26 26   GLUCOSE 108* 106*  --  107* 118* 115* 117*  BUN 65* 58*  --  50* 48* 41* 38*  CREATININE 3.80* 3.26*  --  2.92* 2.74* 2.47* 2.41*  CALCIUM 7.5* 7.5*  --  7.9* 7.9* 8.0* 7.8*  MG 2.3  --  2.2  --  2.1 1.9 1.9  PHOS 6.2* 4.5  --  3.4 3.2 2.9 2.8     Liver Function Tests:  Recent Labs Lab 06/10/15 1435 06/11/15 1519  06/13/15 1626 06/13/15 2132 06/14/15 0010 06/14/15 0440 06/14/15 0805  AST 301* 108*  --   --   --   --   --   --   ALT 150* 112*  --   --   --   --   --   --   ALKPHOS 87 68  --   --   --   --   --   --   BILITOT 6.7* 5.4*  --   --   --   --   --   --   PROT 7.2 6.0*  --   --   --   --   --   --   ALBUMIN 3.5 2.8*  < > 2.5* 2.3* 2.5* 2.3* 2.3*  < > = values in this interval not displayed.  Recent Labs Lab 06/10/15 1435  LIPASE 20*    Recent Labs Lab 06/10/15 1435  AMMONIA 26    CBC:  Recent Labs Lab 06/10/15 1435 06/12/15 0300 06/13/15 0143 06/14/15 0440  WBC 11.1* 10.6 8.4 5.9  NEUTROABS 9.8*  --  7.7*  --   HGB 13.4 12.9* 12.2*  10.7*  HCT 42.0 40.0 38.5* 32.9*  MCV 105.2* 105.9* 107.8* 103.3*  PLT 155 98* 85* 57*    Cardiac Enzymes:  Recent Labs Lab 06/09/15 2025 06/10/15 1435 06/11/15 0040 06/11/15 0312  TROPONINI 0.26* 0.20* 0.22* 0.27*    BNP: Invalid input(s): POCBNP  CBG:  Recent Labs Lab 06/13/15 1206 06/13/15 1612 06/13/15 2021 06/14/15 0002 06/14/15 0759  GLUCAP 84 90 91 94 128*    Microbiology: Results for orders placed or performed during the hospital encounter of 06/10/15  Blood Culture (routine x 2)     Status: None (Preliminary result)   Collection Time: 06/10/15  2:35 PM  Result Value Ref Range Status   Specimen Description BLOOD RIGHT WRIST  Final   Special Requests BOTTLES DRAWN AEROBIC AND ANAEROBIC  1CC  Final   Culture NO GROWTH 4 DAYS  Final   Report Status PENDING  Incomplete  Blood Culture (routine x 2)     Status: None (Preliminary result)   Collection Time: 06/10/15  2:40 PM  Result Value Ref Range Status   Specimen Description BLOOD LEFT ASSIST CONTROL  Final   Special Requests   Final    BOTTLES DRAWN AEROBIC AND ANAEROBIC  AER 6CC ANA 4CC   Culture NO GROWTH 4 DAYS  Final   Report Status PENDING  Incomplete  Urine culture      Status: None   Collection Time: 06/10/15  4:42 PM  Result Value Ref Range Status   Specimen Description URINE, RANDOM  Final   Special Requests NONE  Final   Culture 4,000 COLONIES/mL INSIGNIFICANT GROWTH  Final   Report Status 06/12/2015 FINAL  Final  MRSA PCR Screening     Status: None   Collection Time: 06/10/15  6:27 PM  Result Value Ref Range Status   MRSA by PCR NEGATIVE NEGATIVE Final    Comment:        The GeneXpert MRSA Assay (FDA approved for NASAL specimens only), is one component of a comprehensive MRSA colonization surveillance program. It is not intended to diagnose MRSA infection nor to guide or monitor treatment for MRSA infections.   Culture, sputum-assessment     Status: None   Collection Time: 06/11/15  2:50 PM  Result Value Ref Range Status   Specimen Description SPUTUM  Final   Special Requests NONE  Final   Sputum evaluation THIS SPECIMEN IS ACCEPTABLE FOR SPUTUM CULTURE  Final   Report Status 06/13/2015 FINAL  Final  Culture, respiratory (NON-Expectorated)     Status: None (Preliminary result)   Collection Time: 06/11/15  2:50 PM  Result Value Ref Range Status   Specimen Description SPUTUM  Final   Special Requests NONE Reflexed from [id number]  Final   Gram Stain PENDING  Incomplete   Culture MODERATE GROWTH STAPHYLOCOCCUS AUREUS  Final   Report Status PENDING  Incomplete   Organism ID, Bacteria STAPHYLOCOCCUS AUREUS  Final      Susceptibility   Staphylococcus aureus - MIC*    CIPROFLOXACIN <=0.5 SENSITIVE Sensitive     GENTAMICIN <=0.5 SENSITIVE Sensitive     OXACILLIN 0.5 SENSITIVE Sensitive     TRIMETH/SULFA <=10 SENSITIVE Sensitive     CEFOXITIN SCREEN NEGATIVE Sensitive     Inducible Clindamycin NEGATIVE Sensitive     TETRACYCLINE Value in next row Sensitive      SENSITIVE<=1    * MODERATE GROWTH STAPHYLOCOCCUS AUREUS    Coagulation Studies: No results for input(s): LABPROT, INR in the last 72 hours.  Urinalysis: No  results for input(s):  COLORURINE, LABSPEC, [place], GLUCOSEU, HGBUR, BILIRUBINUR, KETONESUR, PROTEINUR, UROBILINOGEN, NITRITE, LEUKOCYTESUR in the last 72 hours.  Invalid input(s): APPERANCEUR    Imaging: Dg Abd 1 View  06/13/2015   CLINICAL DATA:  Status post OG tube placement.  EXAM: ABDOMEN - 1 VIEW  COMPARISON:  Single view of the abdomen 06/10/2015.  FINDINGS: OG tube is in place with the tip in the distal stomach. The bowel gas pattern is unremarkable.  IMPRESSION: OG tube tip is in the distal stomach.   Electronically Signed   By: Inge Rise M.D.   On: 06/13/2015 11:32   Dg Chest Port 1 View  06/13/2015   CLINICAL DATA:  Hypoxia  EXAM: PORTABLE CHEST - 1 VIEW  COMPARISON:  June 12, 2015  FINDINGS: Endotracheal tube tip is at the carina. Central catheter tip is in the superior cava. Nasogastric tube tip and side port are below the diaphragm. No pneumothorax. There is alveolar edema throughout much of the right lung. There is also alveolar edema in both lower lobes. Heart is enlarged with pulmonary venous hypertension. Suspect small layering effusion on the right. No adenopathy.  IMPRESSION: Tube and catheter positions as described without pneumothorax. Note that the endotracheal tube tip is at the carina.  Congestive heart failure with persistent areas of alveolar edema. Stable cardiomegaly. Suspect layering effusion on the right.  Critical Value/emergent results were called by telephone at the time of interpretation on 06/13/2015 at 10:17 am to Delton See, RN , who verbally acknowledged these results.   Electronically Signed   By: Lowella Grip III M.D.   On: 06/13/2015 10:18     Medications:   . amiodarone 30 mg/hr (06/13/15 2140)  . fentaNYL infusion INTRAVENOUS 200 mcg/hr (06/14/15 0800)  . pureflow 2,500 mL/hr at 06/14/15 0330   . ampicillin-sulbactam (UNASYN) IV  3 g Intravenous Q6H  . antiseptic oral rinse  7 mL Mouth Rinse QID  . budesonide (PULMICORT) nebulizer solution  0.5 mg  Nebulization BID  . chlorhexidine gluconate  15 mL Mouth Rinse BID  . feeding supplement (PRO-STAT SUGAR FREE 64)  30 mL Oral BID  . feeding supplement (VITAL HIGH PROTEIN)  1,000 mL Per Tube Q24H  . free water  25 mL Per Tube 6 times per day  . heparin subcutaneous  5,000 Units Subcutaneous 3 times per day  . ipratropium-albuterol  3 mL Nebulization Q6H  . lactulose  30 g Oral BID   ammonium lactate, heparin, midazolam, rocuronium  Assessment/ Plan:  64 y.o. male with a PMHx of congestive heart failure ejection fraction 30-35%, prior pneumonia, lower extremity edema with cellulitis, atrial fibrillation who was admitted to [facility] on 06/10/2015 for evaluation of altered mental status and shortness of breath. Upon admission the patient underwent intubation with transition to a mechanical ventilation.  1. Acute renal failure/chronic kidney disease stage III with baseline creatinine 1.3/proteinuria. The patient most likely has acute renal failure secondary to altered cardiorenal hemodynamics. His ejection fraction is low at 30%. -Pt seen at bedside, remains critically ill and volume overloaded, will increase UF rate to 125cc/hr, pt may end up needing to restarted on pressors.   2. Acute respiratory failure. Pt remains on the vent, continue vent support, fio2 35%.   3. Acute systolic heart failure. Will increase UF rate to 125cc/hr as above.    LOS: 4 Aaron Harrison 8/18/20169:08 AM

## 2015-06-14 NOTE — Progress Notes (Signed)
Pt remains of CRRT with therapy fluid rate of 2.5 blood flow rate 300 UF 50. Pt  Tolerating well. VSS. Dialysis access looks good. Foley in place. UOP           .IVf and tube feeding is in progress. A fib on CM. Sedated with fentanyl. Resting comfortably in bed without any distress.will continue to observe closely.

## 2015-06-14 NOTE — Progress Notes (Signed)
ANTICOAGULATION CONSULT NOTE - Follow Up Consult  Pharmacy Consult for Heparin Indication: atrial fibrillation  Allergies  Allergen Reactions  . Penicillins Nausea And Vomiting    Patient Measurements: Height: 5\' 8"  (172.7 cm) Weight: 278 lb 7.1 oz (126.3 kg) IBW/kg (Calculated) : 68.4 Heparin Dosing Weight: 98.2 kg  Vital Signs: Temp: 97.7 F (36.5 C) (08/18 0400) Temp Source: Oral (08/18 0400) BP: 98/70 mmHg (08/18 0600) Pulse Rate: 93 (08/18 0600)  Labs:  Recent Labs  06/12/15 0300  06/13/15 0143 06/13/15 1028  06/13/15 1935 06/13/15 2132 06/14/15 0010 06/14/15 0440 06/14/15 0544  HGB 12.9*  --  12.2*  --   --   --   --   --  10.7*  --   HCT 40.0  --  38.5*  --   --   --   --   --  32.9*  --   PLT 98*  --  85*  --   --   --   --   --  57*  --   HEPARINUNFRC 0.14*  < > 0.42 0.40  --  0.24*  --   --   --  0.34  CREATININE  --   --  3.64* 3.99*  < >  --  2.92* 2.74* 2.47*  --   < > = values in this interval not displayed.  Estimated Creatinine Clearance: 40.2 mL/min (by C-G formula based on Cr of 2.47).   Medications:  Scheduled:  . ampicillin-sulbactam (UNASYN) IV  3 g Intravenous Q6H  . antiseptic oral rinse  7 mL Mouth Rinse QID  . aspirin  81 mg Per Tube Daily  . budesonide (PULMICORT) nebulizer solution  0.5 mg Nebulization BID  . chlorhexidine gluconate  15 mL Mouth Rinse BID  . feeding supplement (PRO-STAT SUGAR FREE 64)  30 mL Oral BID  . feeding supplement (VITAL HIGH PROTEIN)  1,000 mL Per Tube Q24H  . free water  25 mL Per Tube 6 times per day  . ipratropium-albuterol  3 mL Nebulization Q6H  . lactulose  30 g Oral BID   Infusions:  . amiodarone 30 mg/hr (06/13/15 2140)  . fentaNYL infusion INTRAVENOUS 10 mcg/hr (06/14/15 0032)  . heparin 2,150 Units/hr (06/13/15 2140)  . norepinephrine 5 mcg/min (06/13/15 0855)  . pureflow 2,500 mL/hr at 06/14/15 0330   PRN: ammonium lactate, heparin, midazolam, rocuronium  Assessment: Pharmacy consulted  for heparin drip management for 64 yo male with afib. Patient is currently requiring mechanical ventilation and CRRT. Patient is currently receiving heparin at 1950 units/hr (19.30mL/hr).   Goal of Therapy:  Heparin level 0.3-0.7 units/ml Monitor platelets by anticoagulation protocol: Yes   Plan:  Heparin level 0.34 - therapeutic. Will check again this afternoon then if therapeutic switch to daily monitoring.   Laural Benes, Pharm.D. Clinical Pharmacist 06/14/2015,6:18 AM

## 2015-06-14 NOTE — Progress Notes (Signed)
[name] called the pharmacy to say she did not order the heparin drip on 8/15 and she was not working that day. I am unsure why the heparin drip was put under her name but Dr. Fletcher Anon placed the original consult. [name] is the attending today and said it was okay to adjust the drip under her name today.   Aaron Harrison A. [place], [place].D. Clinical Pharmacist 06/14/2015

## 2015-06-14 NOTE — Progress Notes (Signed)
Patient: Aaron Harrison / Admit Date: 06/10/2015 / Date of Encounter: 06/14/2015, 8:00 AM   Subjective: Re-intubation on 8/17 secondary to increased WOB. Now on CRRT.   Review of Systems: Review of Systems  Unable to perform ROS: intubated    Objective: Telemetry: Afib, 90s to low 100s Physical Exam: Blood pressure 101/67, pulse 92, temperature 97.7 F (36.5 C), temperature source Oral, resp. rate 24, height 5\' 8"  (1.727 m), weight 278 lb 7.1 oz (126.3 kg), SpO2 100 %. Body mass index is 42.35 kg/(m^2). General: Ill appearing. Head: Normocephalic, atraumatic, sclera non-icteric, no xanthomas, nares are without discharge. Neck: Negative for carotid bruits. JVP not elevated. Lungs: Coarse bilaterally. Breathing is unlabored. Heart: Irregularly-irregular S1 S2 without murmurs, rubs, or gallops.  Abdomen: Obese, soft, non-tender, non-distended with normoactive bowel sounds. No rebound/guarding. Extremities: No clubbing or cyanosis. 2-3+ pitting edema with surrounding erythema.  Neuro: Alert and oriented X 3. Moves all extremities spontaneously. Psych:  Responds to questions appropriately with a normal affect.   Intake/Output Summary (Last 24 hours) at 06/14/15 0800 Last data filed at 06/14/15 0700  Gross per 24 hour  Intake 9502.65 ml  Output   1185 ml  Net 8317.65 ml    Inpatient Medications:  . ampicillin-sulbactam (UNASYN) IV  3 g Intravenous Q6H  . antiseptic oral rinse  7 mL Mouth Rinse QID  . aspirin  81 mg Per Tube Daily  . budesonide (PULMICORT) nebulizer solution  0.5 mg Nebulization BID  . chlorhexidine gluconate  15 mL Mouth Rinse BID  . feeding supplement (PRO-STAT SUGAR FREE 64)  30 mL Oral BID  . feeding supplement (VITAL HIGH PROTEIN)  1,000 mL Per Tube Q24H  . free water  25 mL Per Tube 6 times per day  . heparin subcutaneous  5,000 Units Subcutaneous 3 times per day  . ipratropium-albuterol  3 mL Nebulization Q6H  . lactulose  30 g Oral BID   Infusions:   . amiodarone 30 mg/hr (06/13/15 2140)  . fentaNYL infusion INTRAVENOUS 15 mcg/hr (06/14/15 0500)  . pureflow 2,500 mL/hr at 06/14/15 0330    Labs:  Recent Labs  06/14/15 0010 06/14/15 0440  NA 142 142  K 3.6 3.4*  CL 109 108  CO2 25 26  GLUCOSE 118* 115*  BUN 48* 41*  CREATININE 2.74* 2.47*  CALCIUM 7.9* 8.0*  MG 2.1 1.9  PHOS 3.2 2.9    Recent Labs  06/11/15 1519  06/14/15 0010 06/14/15 0440  AST 108*  --   --   --   ALT 112*  --   --   --   ALKPHOS 68  --   --   --   BILITOT 5.4*  --   --   --   PROT 6.0*  --   --   --   ALBUMIN 2.8*  < > 2.5* 2.3*  < > = values in this interval not displayed.  Recent Labs  06/13/15 0143 06/14/15 0440  WBC 8.4 5.9  NEUTROABS 7.7*  --   HGB 12.2* 10.7*  HCT 38.5* 32.9*  MCV 107.8* 103.3*  PLT 85* 57*   No results for input(s): CKTOTAL, CKMB, TROPONINI in the last 72 hours. Invalid input(s): POCBNP No results for input(s): HGBA1C in the last 72 hours.   Weights: Filed Weights   06/12/15 0357 06/13/15 0359 06/14/15 0500  Weight: 273 lb 13 oz (124.2 kg) 282 lb 3 oz (128 kg) 278 lb 7.1 oz (126.3 kg)  Radiology/Studies:  Dg Abd 1 View  06/13/2015   CLINICAL DATA:  Status post OG tube placement.  EXAM: ABDOMEN - 1 VIEW  COMPARISON:  Single view of the abdomen 06/10/2015.  FINDINGS: OG tube is in place with the tip in the distal stomach. The bowel gas pattern is unremarkable.  IMPRESSION: OG tube tip is in the distal stomach.   Electronically Signed   By: Inge Rise M.D.   On: 06/13/2015 11:32   Dg Abd 1 View  06/10/2015   CLINICAL DATA:  64 year old male enteric tube placement. Initial encounter.  EXAM: ABDOMEN - 1 VIEW  COMPARISON:  Portable chest radiograph 1556 hours today.  FINDINGS: Portable AP supine view at 1552 hours. There is no enteric tube visible in the abdomen. There is gaseous distension of the stomach. Non obstructed bowel gas pattern otherwise. On the recent portable chest tubing was looped over the  lower neck.  IMPRESSION: 1. Strongly suspect the enteric tube is looped in the pharynx, visible only on the portable chest radiograph from 1556 hours. Recommend removal and replacement. 2. Gaseous distension of the stomach.   Electronically Signed   By: Genevie Ann M.D.   On: 06/10/2015 16:32   Dg Chest Port 1 View  06/13/2015   CLINICAL DATA:  Hypoxia  EXAM: PORTABLE CHEST - 1 VIEW  COMPARISON:  June 12, 2015  FINDINGS: Endotracheal tube tip is at the carina. Central catheter tip is in the superior cava. Nasogastric tube tip and side port are below the diaphragm. No pneumothorax. There is alveolar edema throughout much of the right lung. There is also alveolar edema in both lower lobes. Heart is enlarged with pulmonary venous hypertension. Suspect small layering effusion on the right. No adenopathy.  IMPRESSION: Tube and catheter positions as described without pneumothorax. Note that the endotracheal tube tip is at the carina.  Congestive heart failure with persistent areas of alveolar edema. Stable cardiomegaly. Suspect layering effusion on the right.  Critical Value/emergent results were called by telephone at the time of interpretation on 06/13/2015 at 10:17 am to Delton See, RN , who verbally acknowledged these results.   Electronically Signed   By: Lowella Grip III M.D.   On: 06/13/2015 10:18   Dg Chest Port 1 View  06/12/2015   CLINICAL DATA:  Shortness of breath.  EXAM: PORTABLE CHEST - 1 VIEW  COMPARISON:  06/11/2015.  FINDINGS: Interim removal of endotracheal tube. Left subclavian line in good anatomic position. Cardiomegaly. Progressive bilateral pulmonary infiltrates. Small left pleural effusion. No pneumothorax.  IMPRESSION: 1. Interim removal of endotracheal tube and NG tube. Left sub plate central line in stable position. 2. Cardiomegaly with pulmonary venous congestion and significant interim progression of bilateral pulmonary infiltrates. Persistent left pleural effusion. Findings are  consistent with progressive congestive heart failure. Underlying pneumonia cannot be excluded.   Electronically Signed   By: Marcello Moores  Register   On: 06/12/2015 07:41   Dg Chest Port 1 View  06/11/2015   CLINICAL DATA:  Central line placement.  Initial encounter.  EXAM: PORTABLE CHEST - 1 VIEW  COMPARISON:  Chest radiograph performed 06/10/2015  FINDINGS: The patient's endotracheal tube is seen ending 3 cm above the carina. A left subclavian line is noted ending about the distal SVC.  A small left pleural effusion is noted. Right upper lobe airspace opacification is concerning for pneumonia, similar in appearance to the recent prior study. Underlying vascular congestion is noted. No pneumothorax is seen  The cardiomediastinal silhouette is enlarged.  No acute osseous abnormalities are identified.  IMPRESSION: 1. Endotracheal tube seen ending 3 cm above the carina. 2. Left subclavian line noted ending about the distal SVC. 3. Right upper lobe airspace opacification, compatible with pneumonia, similar in appearance to the prior study. 4. Small left pleural effusion noted. Cardiomegaly and vascular congestion.   Electronically Signed   By: Garald Balding M.D.   On: 06/11/2015 02:08   Dg Chest Port 1 View  06/10/2015   CLINICAL DATA:  Status post intubation  EXAM: PORTABLE CHEST - 1 VIEW  COMPARISON:  06/10/2015  FINDINGS: Cardiac shadow remains enlarged. A nasogastric catheter and endotracheal tube are again seen. The endotracheal tube is been withdrawn and now lies in satisfactory position. Increasing consolidation within the right upper lobe is noted. Some patchy changes are noted in the left lung base as well. The left changes are stable from the recent exam.  IMPRESSION: Increasing right upper lobe consolidation.   Electronically Signed   By: Inez Catalina M.D.   On: 06/10/2015 19:05   Dg Chest Portable 1 View  06/10/2015   ADDENDUM REPORT: 06/10/2015 16:41  ADDENDUM: Study discussed by telephone with Dr. Hinda Kehr on 06/10/2015 at 1636 hours.  We also discussed that the enteric tube appears to be looped in the pharynx/neck on this image.  He advises that both the ET tube and enteric tube have been repositioned, and a repeat abdominal film is pending.   Electronically Signed   By: Genevie Ann M.D.   On: 06/10/2015 16:41   06/10/2015   CLINICAL DATA:  64 year old male intubated. Tachypnea. Initial encounter.  EXAM: PORTABLE CHEST - 1 VIEW  COMPARISON:  1442 hours today, and earlier  FINDINGS: Portable AP supine view at 1556 hours. Endotracheal tube tip at the carina. Decreased ventilation at the left lung base. Stable cardiomegaly and mediastinal contours. Mild crowding of markings elsewhere.  IMPRESSION: 1. Intubated, endotracheal tube tip at the carina. Retract up to 3 cm 4 Mar I optimal placement. 2. Left greater than right perihilar increased atelectasis.  Electronically Signed: By: Genevie Ann M.D. On: 06/10/2015 16:31   Dg Chest Port 1 View  06/10/2015   CLINICAL DATA:  Tachypnea, shallow breathing.  EXAM: PORTABLE CHEST - 1 VIEW  COMPARISON:  April 08, 2015.  FINDINGS: Stable cardiomegaly. No pneumothorax is noted. Increased central pulmonary vascular congestion is noted. Mild bibasilar opacities are noted concerning for subsegmental atelectasis, edema or effusions. Increased right upper lobe opacity is noted concerning for possible pneumonia or edema. Bony thorax is intact.  IMPRESSION: Cardiomegaly and increased central pulmonary vascular congestion is noted, with increased right upper lobe opacity concerning for pneumonia or possibly edema. Mild bibasilar opacities are noted concerning for subsegmental atelectasis or edema with possible associated minimal pleural effusions.   Electronically Signed   By: Marijo Conception, M.D.   On: 06/10/2015 15:08   Dg Abd Portable 1v  06/10/2015   CLINICAL DATA:  OG tube placement.  EXAM: PORTABLE ABDOMEN - 1 VIEW 4:31 p.m.  COMPARISON:  06/10/2015 3:52 p.m.  FINDINGS: There is  an OG tube with tip in the fundus of the stomach. Endotracheal tube appears in good position 3 cm above the carina.  No visible dilated bowel. Pulmonary infiltrates noted in the right upper lobe and left lower lobe.  IMPRESSION: OG tube tip in the fundus of the stomach.   Electronically Signed   By: Lorriane Shire M.D.   On: 06/10/2015 16:51  Assessment and Plan  1. Acute respiratory failure with hypoxia and sepsis: -Extubated on 8/15-->BiPAP until AM of 8/17-->re-intubated 2/2 increased WOB on AM of 8/17 -Likely multifactorial in the setting of healthy care associated pneumonia (given his recent hospitalization in June) and likely acute on chronic systolic CHF -Soft blood pressures are precluding IV Lasix at this time -Continue antibiotic, nebs, and steroids treatment per IM   2. Chronic Afib with RVR: -Initially with rates up into the 180s, likely exacerbated by underlying PNA and cellulitis  -Improved into the low 100's to upper 90s -Hypotension precludes usage of Cardizem or beta blockers at this time -Acute renal failure precludes usage of digoxin at this time for added rate control -Continue use of IV amiodarone for added rate control, unable to taper at this time given continued multiorgan failure  -Would benefit from better rate control given his cardiomyopathy  -Not on long term anticoagulation, per his choice upon reading prior notes -CHADSVASc at least 2 (CHF, DM), giving him an estimated annual stroke risk of 2.2% -Not a long term full dose anticoagulation candidate at this time given his multiorgan failure  -Hold aspirin at this point given his declining platelet count  3. Acute on chronic systolic CHF: -CRRT per Renal -Unable to continue diuresis at this time given his significant hypotension and acute renal failure  -Echo EF 30-35%, diffuse HK, PASP 32 mm Hg -When able would be a candidate for Entresto and Toprol vs Coreg   4. Acute renal failure/possible  cardiorenal syndrome: -CRRT per renal  -Nephrology planning for short term dialysis given his worsening renal function and lack of urine output  -Likely secondary to ATN in the setting of hypotension and infection   5. Hyperkalemia: -Status post Kayexalate -Currently with hypokalemia of 3.5, replete per Renal   6. Bilateral lower extremity cellulitis: -ABX per IM  7. Coagulopathy: -Not on anticoagulation -Poor prognosis   ---Overall, high risk of cardiopulmonary death   Melvern Banker, PA-C Pager: [phone] 06/14/2015, 8:00 AM

## 2015-06-14 NOTE — Progress Notes (Signed)
Upon initial assessment, pt was noted to have left pupil at a  2 and reactive, right at a 3 and reactive.  Pt is currently responsive, sedated on fentanyl at 300 mcg/hr.  Pt is also intubated, and on crrt.  Prime doc was notified of unequal pupil assessment.  Per Dr. Ulysees Barns, reassess pt in AM.

## 2015-06-15 ENCOUNTER — Inpatient Hospital Stay: Payer: Medicaid Other

## 2015-06-15 DIAGNOSIS — Z9911 Dependence on respirator [ventilator] status: Secondary | ICD-10-CM

## 2015-06-15 DIAGNOSIS — I4891 Unspecified atrial fibrillation: Secondary | ICD-10-CM

## 2015-06-15 DIAGNOSIS — J9600 Acute respiratory failure, unspecified whether with hypoxia or hypercapnia: Secondary | ICD-10-CM

## 2015-06-15 DIAGNOSIS — J189 Pneumonia, unspecified organism: Secondary | ICD-10-CM

## 2015-06-15 DIAGNOSIS — Z515 Encounter for palliative care: Secondary | ICD-10-CM

## 2015-06-15 DIAGNOSIS — N19 Unspecified kidney failure: Secondary | ICD-10-CM

## 2015-06-15 DIAGNOSIS — N183 Chronic kidney disease, stage 3 (moderate): Secondary | ICD-10-CM

## 2015-06-15 DIAGNOSIS — I509 Heart failure, unspecified: Secondary | ICD-10-CM

## 2015-06-15 LAB — CULTURE, BLOOD (ROUTINE X 2)
CULTURE: NO GROWTH
Culture: NO GROWTH

## 2015-06-15 LAB — RENAL FUNCTION PANEL
ALBUMIN: 2.4 g/dL — AB (ref 3.5–5.0)
ALBUMIN: 2.3 g/dL — AB (ref 3.5–5.0)
ALBUMIN: 2.3 g/dL — AB (ref 3.5–5.0)
ALBUMIN: 2.2 g/dL — AB (ref 3.5–5.0)
ANION GAP: 3 — AB (ref 5–15)
ANION GAP: 5 (ref 5–15)
ANION GAP: 5 (ref 5–15)
ANION GAP: 6 (ref 5–15)
ANION GAP: 6 (ref 5–15)
Albumin: 2.4 g/dL — ABNORMAL LOW (ref 3.5–5.0)
Albumin: 2.3 g/dL — ABNORMAL LOW (ref 3.5–5.0)
Anion gap: 7 (ref 5–15)
BUN: 25 mg/dL — AB (ref 6–20)
BUN: 25 mg/dL — AB (ref 6–20)
BUN: 27 mg/dL — ABNORMAL HIGH (ref 6–20)
BUN: 27 mg/dL — ABNORMAL HIGH (ref 6–20)
BUN: 29 mg/dL — ABNORMAL HIGH (ref 6–20)
BUN: 30 mg/dL — ABNORMAL HIGH (ref 6–20)
CALCIUM: 7.7 mg/dL — AB (ref 8.9–10.3)
CALCIUM: 7.9 mg/dL — AB (ref 8.9–10.3)
CALCIUM: 7.9 mg/dL — AB (ref 8.9–10.3)
CALCIUM: 8.1 mg/dL — AB (ref 8.9–10.3)
CALCIUM: 8.1 mg/dL — AB (ref 8.9–10.3)
CALCIUM: 8.2 mg/dL — AB (ref 8.9–10.3)
CHLORIDE: 107 mmol/L (ref 101–111)
CHLORIDE: 109 mmol/L (ref 101–111)
CHLORIDE: 110 mmol/L (ref 101–111)
CO2: 25 mmol/L (ref 22–32)
CO2: 26 mmol/L (ref 22–32)
CO2: 27 mmol/L (ref 22–32)
CO2: 28 mmol/L (ref 22–32)
CO2: 28 mmol/L (ref 22–32)
CO2: 28 mmol/L (ref 22–32)
CREATININE: 1.79 mg/dL — AB (ref 0.61–1.24)
CREATININE: 1.7 mg/dL — AB (ref 0.61–1.24)
CREATININE: 1.61 mg/dL — AB (ref 0.61–1.24)
CREATININE: 1.61 mg/dL — AB (ref 0.61–1.24)
Chloride: 111 mmol/L (ref 101–111)
Chloride: 109 mmol/L (ref 101–111)
Chloride: 108 mmol/L (ref 101–111)
Creatinine, Ser: 1.77 mg/dL — ABNORMAL HIGH (ref 0.61–1.24)
Creatinine, Ser: 1.71 mg/dL — ABNORMAL HIGH (ref 0.61–1.24)
GFR calc Af Amer: 46 mL/min — ABNORMAL LOW (ref >60)
GFR calc Af Amer: 48 mL/min — ABNORMAL LOW (ref >60)
GFR calc Af Amer: 51 mL/min — ABNORMAL LOW (ref >60)
GFR calc non Af Amer: 39 mL/min — ABNORMAL LOW (ref >60)
GFR calc non Af Amer: 41 mL/min — ABNORMAL LOW (ref >60)
GFR calc non Af Amer: 41 mL/min — ABNORMAL LOW (ref >60)
GFR calc non Af Amer: 44 mL/min — ABNORMAL LOW (ref >60)
GFR, EST AFRICAN AMERICAN: 45 mL/min — AB (ref >60)
GFR, EST AFRICAN AMERICAN: 48 mL/min — AB (ref >60)
GFR, EST AFRICAN AMERICAN: 51 mL/min — AB (ref >60)
GFR, EST NON AFRICAN AMERICAN: 39 mL/min — AB (ref >60)
GFR, EST NON AFRICAN AMERICAN: 44 mL/min — AB (ref >60)
GLUCOSE: 104 mg/dL — AB (ref 65–99)
GLUCOSE: 113 mg/dL — AB (ref 65–99)
GLUCOSE: 122 mg/dL — AB (ref 65–99)
GLUCOSE: 93 mg/dL (ref 65–99)
Glucose, Bld: 121 mg/dL — ABNORMAL HIGH (ref 65–99)
Glucose, Bld: 122 mg/dL — ABNORMAL HIGH (ref 65–99)
PHOSPHORUS: 2 mg/dL — AB (ref 2.5–4.6)
PHOSPHORUS: 3.1 mg/dL (ref 2.5–4.6)
PHOSPHORUS: 2.6 mg/dL (ref 2.5–4.6)
PHOSPHORUS: 2.8 mg/dL (ref 2.5–4.6)
POTASSIUM: 3.5 mmol/L (ref 3.5–5.1)
Phosphorus: 2.1 mg/dL — ABNORMAL LOW (ref 2.5–4.6)
Phosphorus: 2.2 mg/dL — ABNORMAL LOW (ref 2.5–4.6)
Potassium: 3.6 mmol/L (ref 3.5–5.1)
Potassium: 3.5 mmol/L (ref 3.5–5.1)
Potassium: 3.9 mmol/L (ref 3.5–5.1)
Potassium: 3.5 mmol/L (ref 3.5–5.1)
Potassium: 3.6 mmol/L (ref 3.5–5.1)
SODIUM: 141 mmol/L (ref 135–145)
SODIUM: 141 mmol/L (ref 135–145)
SODIUM: 142 mmol/L (ref 135–145)
SODIUM: 142 mmol/L (ref 135–145)
Sodium: 141 mmol/L (ref 135–145)
Sodium: 141 mmol/L (ref 135–145)

## 2015-06-15 LAB — DIFFERENTIAL
BASOS PCT: 1 % (ref 0–1)
BLASTS: 0 %
Band Neutrophils: 0 % (ref 0–10)
Eosinophils Relative: 3 % (ref 0–5)
LYMPHS PCT: 8 % — AB (ref 12–46)
MYELOCYTES: 0 %
Metamyelocytes Relative: 0 %
Monocytes Relative: 5 % (ref 3–12)
NRBC: 0 /100{WBCs}
Neutrophils Relative %: 83 % — ABNORMAL HIGH (ref 43–77)
OTHER: 0 %
PROMYELOCYTES ABS: 0 %

## 2015-06-15 LAB — CBC
HEMATOCRIT: 34.5 % — AB (ref 40.0–52.0)
Hemoglobin: 10.6 g/dL — ABNORMAL LOW (ref 13.0–18.0)
MCH: 32.8 pg (ref 26.0–34.0)
MCHC: 30.7 g/dL — ABNORMAL LOW (ref 32.0–36.0)
MCV: 106.7 fL — AB (ref 80.0–100.0)
Platelets: 46 10*3/uL — ABNORMAL LOW (ref 150–440)
RBC: 3.23 MIL/uL — AB (ref 4.40–5.90)
RDW: 18.7 % — ABNORMAL HIGH (ref 11.5–14.5)
WBC: 4.2 10*3/uL (ref 3.8–10.6)

## 2015-06-15 LAB — MAGNESIUM
MAGNESIUM: 1.7 mg/dL (ref 1.7–2.4)
Magnesium: 1.7 mg/dL (ref 1.7–2.4)
Magnesium: 1.7 mg/dL (ref 1.7–2.4)
Magnesium: 1.8 mg/dL (ref 1.7–2.4)
Magnesium: 1.9 mg/dL (ref 1.7–2.4)
Magnesium: 1.9 mg/dL (ref 1.7–2.4)

## 2015-06-15 LAB — GLUCOSE, CAPILLARY
GLUCOSE-CAPILLARY: 127 mg/dL — AB (ref 65–99)
GLUCOSE-CAPILLARY: 108 mg/dL — AB (ref 65–99)
GLUCOSE-CAPILLARY: 101 mg/dL — AB (ref 65–99)
GLUCOSE-CAPILLARY: 108 mg/dL — AB (ref 65–99)
Glucose-Capillary: 80 mg/dL (ref 65–99)

## 2015-06-15 MED ORDER — ANTICOAGULANT SODIUM CITRATE 4% (200MG/5ML) IV SOLN
5.0000 mL | Status: DC | PRN
  Filled 2015-06-15 (×3): qty 250

## 2015-06-15 MED ORDER — FAMOTIDINE 20 MG PO TABS
20.0000 mg | ORAL_TABLET | ORAL | Status: DC
  Administered 2015-06-15 – 2015-06-18 (×4): 20 mg via ORAL
  Filled 2015-06-15 (×4): qty 1

## 2015-06-15 MED ORDER — VITAL HIGH PROTEIN PO LIQD
1000.0000 mL | ORAL | Status: DC
  Administered 2015-06-16: 1000 mL

## 2015-06-15 MED ORDER — SODIUM PHOSPHATE 3 MMOLE/ML IV SOLN
20.0000 mmol | Freq: Once | INTRAVENOUS | Status: AC
  Administered 2015-06-15: 20 mmol via INTRAVENOUS
  Filled 2015-06-15: qty 6.67

## 2015-06-15 MED ORDER — CLONAZEPAM 0.5 MG PO TABS
0.5000 mg | ORAL_TABLET | Freq: Two times a day (BID) | ORAL | Status: DC
  Administered 2015-06-15 – 2015-06-20 (×11): 0.5 mg via ORAL
  Filled 2015-06-15 (×11): qty 1

## 2015-06-15 NOTE — Progress Notes (Signed)
[facility] NOTE  Pharmacy Consult for CRRT medication adjustment.   Indication: ICU Status   Allergies  Allergen Reactions  . Penicillins Nausea And Vomiting    Patient Measurements: Height: 5\' 8"  (172.7 cm) Weight: 274 lb 11.1 oz (124.6 kg) IBW/kg (Calculated) : 68.4   Vital Signs: Temp: 97.3 F (36.3 C) (08/19 0700) BP: 89/77 mmHg (08/19 0700) Pulse Rate: 90 (08/19 0600) Intake/Output from previous day: 08/18 0701 - 08/19 0700 In: 3203.5 [I.V.:1025.5; NG/GT:1718; IV Piggyback:450] Out: 5784 [Urine:75; Emesis/NG output:700; Stool:1100] Intake/Output from this shift: Total I/O In: 902.4 [I.V.:202.4; NG/GT:350; IV Piggyback:350] Out: 592 [Other:592] Vent settings for last 24 hours: Vent Mode:  [-] PRVC FiO2 (%):  [35 %] 35 % Set Rate:  [24 bmp] 24 bmp Vt Set:  [500 mL] 500 mL PEEP:  [5 cmH20] 5 cmH20  Labs:  Recent Labs  06/13/15 0143  06/14/15 0440  06/15/15 0345 06/15/15 0802 06/15/15 1201  WBC 8.4  --  5.9  --   --   --   --   HGB 12.2*  --  10.7*  --   --   --   --   HCT 38.5*  --  32.9*  --   --   --   --   PLT 85*  --  57*  --   --   --   --   CREATININE 3.64*  < > 2.47*  < > 1.79* 1.70* 1.71*  MG  --   < > 1.9  < > 1.9 1.8 1.7  PHOS  --   < > 2.9  < > 2.0* 2.2* 3.1  ALBUMIN  --   < > 2.3*  < > 2.4* 2.3* 2.3*  < > = values in this interval not displayed. Estimated Creatinine Clearance: 57.6 mL/min (by C-G formula based on Cr of 1.71).   Recent Labs  06/15/15 0342 06/15/15 0746 06/15/15 1150  GLUCAP 127* 80 108*    Microbiology: Recent Results (from the past 720 hour(s))  Blood Culture (routine x 2)     Status: None   Collection Time: 06/10/15  2:35 PM  Result Value Ref Range Status   Specimen Description BLOOD RIGHT WRIST  Final   Special Requests BOTTLES DRAWN AEROBIC AND ANAEROBIC  1CC  Final   Culture NO GROWTH 5 DAYS  Final   Report Status 06/15/2015 FINAL  Final  Blood Culture (routine x 2)     Status: None   Collection Time: 06/10/15  2:40 PM  Result Value Ref Range Status   Specimen Description BLOOD LEFT ASSIST CONTROL  Final   Special Requests   Final    BOTTLES DRAWN AEROBIC AND ANAEROBIC  AER 6CC ANA 4CC   Culture NO GROWTH 5 DAYS  Final   Report Status 06/15/2015 FINAL  Final  Urine culture     Status: None   Collection Time: 06/10/15  4:42 PM  Result Value Ref Range Status   Specimen Description URINE, RANDOM  Final   Special Requests NONE  Final   Culture 4,000 COLONIES/mL INSIGNIFICANT GROWTH  Final   Report Status 06/12/2015 FINAL  Final  MRSA PCR Screening     Status: None   Collection Time: 06/10/15  6:27 PM  Result Value Ref Range Status   MRSA by PCR NEGATIVE NEGATIVE Final    Comment:        The GeneXpert MRSA Assay (FDA approved for NASAL specimens only), is one component of a comprehensive  MRSA colonization surveillance program. It is not intended to diagnose MRSA infection nor to guide or monitor treatment for MRSA infections.   Culture, sputum-assessment     Status: None   Collection Time: 06/11/15  2:50 PM  Result Value Ref Range Status   Specimen Description SPUTUM  Final   Special Requests NONE  Final   Sputum evaluation THIS SPECIMEN IS ACCEPTABLE FOR SPUTUM CULTURE  Final   Report Status 06/13/2015 FINAL  Final  Culture, respiratory (NON-Expectorated)     Status: None   Collection Time: 06/11/15  2:50 PM  Result Value Ref Range Status   Specimen Description SPUTUM  Final   Special Requests NONE Reflexed from [id number]  Final   Gram Stain   Final    MODERATE WBC SEEN RARE GRAM POSITIVE COCCI GOOD SPECIMEN - 80-90% WBCS    Culture MODERATE GROWTH STAPHYLOCOCCUS AUREUS  Final   Report Status 06/14/2015 FINAL  Final   Organism ID, Bacteria STAPHYLOCOCCUS AUREUS  Final      Susceptibility   Staphylococcus aureus - MIC*    CIPROFLOXACIN <=0.5 SENSITIVE Sensitive     GENTAMICIN <=0.5 SENSITIVE Sensitive     OXACILLIN 0.5 SENSITIVE Sensitive      TRIMETH/SULFA <=10 SENSITIVE Sensitive     CEFOXITIN SCREEN NEGATIVE Sensitive     Inducible Clindamycin NEGATIVE Sensitive     TETRACYCLINE Value in next row Sensitive      SENSITIVE<=1    * MODERATE GROWTH STAPHYLOCOCCUS AUREUS  Wound culture     Status: None (Preliminary result)   Collection Time: 06/13/15  1:56 PM  Result Value Ref Range Status   Specimen Description LEG  Final   Special Requests Normal  Final   Gram Stain   Final    FEW WBC SEEN MODERATE GRAM NEGATIVE RODS FEW GRAM POSITIVE COCCI    Culture   Final    LIGHT GROWTH STAPHYLOCOCCUS AUREUS HOLDING FOR POSSIBLE ADDITIONAL PATHOGENS    Report Status PENDING  Incomplete   Organism ID, Bacteria STAPHYLOCOCCUS AUREUS  Final      Susceptibility   Staphylococcus aureus - MIC*    CIPROFLOXACIN <=0.5 SENSITIVE Sensitive     GENTAMICIN <=0.5 SENSITIVE Sensitive     OXACILLIN 0.5 SENSITIVE Sensitive     TRIMETH/SULFA <=10 SENSITIVE Sensitive     CEFOXITIN SCREEN NEGATIVE Sensitive     Inducible Clindamycin NEGATIVE Sensitive     TETRACYCLINE Value in next row Sensitive      SENSITIVE<=1    * LIGHT GROWTH STAPHYLOCOCCUS AUREUS  Wound culture     Status: None (Preliminary result)   Collection Time: 06/13/15  1:58 PM  Result Value Ref Range Status   Specimen Description LEG  Final   Special Requests Normal  Final   Gram Stain RARE WBC SEEN FEW GRAM NEGATIVE RODS   Final   Culture NO GROWTH 2 DAYS  Final   Report Status PENDING  Incomplete    Medications:  Scheduled:  . ampicillin-sulbactam (UNASYN) IV  3 g Intravenous Q6H  . antiseptic oral rinse  7 mL Mouth Rinse QID  . budesonide (PULMICORT) nebulizer solution  0.5 mg Nebulization BID  . chlorhexidine gluconate  15 mL Mouth Rinse BID  . clonazePAM  0.5 mg Oral BID  . famotidine  20 mg Oral Q24H  . feeding supplement (PRO-STAT SUGAR FREE 64)  30 mL Oral BID  . free water  25 mL Per Tube 6 times per day  . heparin subcutaneous  5,000 Units Subcutaneous  3  times per day  . ipratropium-albuterol  3 mL Nebulization Q6H   Infusions:  . amiodarone 30 mg/hr (06/15/15 1117)  . feeding supplement (VITAL HIGH PROTEIN)    . fentaNYL infusion INTRAVENOUS 300 mcg/hr (06/15/15 1300)  . norepinephrine    . pureflow 2,500 mL/hr at 06/15/15 1502    Assessment: 64 yo male requiring mechanical ventilation and CRRT.    Plan:  No further adjustments warranted at this time.    Pharmacy will continue to monitor and adjust per consult.   Aaron Harrison 06/15/2015,3:35 PM

## 2015-06-15 NOTE — Progress Notes (Signed)
Performed patient sedation vacation- Pt able to squeeze hands- but became very agitated- Pt placed in spontaneous mode- patient pulled low volumes in the 100's and oxygen sats dropped and patient became more agitated- Pt then placed back on a rate- Dr. Mortimer Fries notified.  Continuing CRRT per Dr. Holley Raring- replacing phosphorus- Patient still afib on monitor though controlled- Tolerating tube feeds- made Dr. Mortimer Fries aware of the Taut belly- Large output from flexiseal - Dr. Mortimer Fries and [name] made aware.

## 2015-06-15 NOTE — Progress Notes (Signed)
Initial Nutrition Assessment   INTERVENTION:   EN: recommend continuing TF at current goal rate   NUTRITION DIAGNOSIS:   Inadequate oral intake related to acute illness as evidenced by NPO status. Being addressed via TF  GOAL:   Patient will meet greater than or equal to 90% of their needs  MONITOR:    (Energy Intake, EN, Digestive System, Electrolyte/Renal Profile, Glucose Profile)   ASSESSMENT:    Pt remains on vent, on CRRT  Diet Order:  Diet NPO time specified   EN: tolerating Vital High Protein at rate of 60 ml/hr  Digestive System: flexiseal with 700 mL out, abdomen distended, MD is aware  Electrolyte/Renal profile:   Recent Labs Lab 06/15/15 0345 06/15/15 0802 06/15/15 1201  NA 142 141 141  K 3.5 3.9 3.5  CL 111 107 109  CO2 28 28 26   BUN 29* 27* 27*  CREATININE 1.79* 1.70* 1.71*  CALCIUM 8.1* 8.1* 7.7*  MG 1.9 1.8 1.7  PHOS 2.0* 2.2* 3.1  GLUCOSE 121* 113* 122*    Glucose Profile:  Recent Labs  06/15/15 0342 06/15/15 0746 06/15/15 1150  GLUCAP 127* 80 108*   Meds: levophed, fentanyl, versed  Skin:  Reviewed, no issues  Last BM:  8/18  Height:   Ht Readings from Last 1 Encounters:  06/13/15 5\' 8"  (1.727 m)    Weight:   Wt Readings from Last 1 Encounters:  06/15/15 274 lb 11.1 oz (124.6 kg)    Ideal Body Weight:     BMI:  Body mass index is 41.78 kg/(m^2).  Estimated Nutritional Needs:   Kcal:  8413-2440 kcals (11-14 kcals/kg)   Protein:  140-175 g (2.0-2.5 g/kg)   Fluid:  1750-2100 mL (25-30 ml/kg)   HIGH Care Level  Kerman Passey MS, RD, LDN [phone] Pager

## 2015-06-15 NOTE — Consult Note (Addendum)
Palliative Medicine Inpatient Consult Note   Name: Aaron Harrison Date: 06/15/2015 MRN: [id number]  DOB: 14-Jun-1951  Referring Physician: Fritzi Mandes, MD  Palliative Care consult requested for this 64 y.o. male for goals of medical therapy in patient with multiple problems associated with his initial acute respiratory failure due to CAP and AFIB with RVR.  He has multiple organ systems involved and he is not doing well.  He also has no family.  A cousin is informed regularly but is not a relative.  Pt is currently full code.  He has been reintubated recently and trials off of the vent have not gone so well today. I am asked to attempt to establish some goals of care in this patient.  CONVERSATIONS, EVENTS, AND PLANS:  1)  Patient cannot make his healthcare decisions (intubated and critically ill) and he has no known documents such as a living will or HCPOA.  Pt had an attorney friend with him at time of admission, but this turns out to only be a friend who happened to be an attorney--he had not completed any legal work or advanced directives for pt.   2)  Patient's cousin is established as the primary contact and the one who I spoke with about key decision making.  3)  DNR is established today in conversation today by phone with Aaron Harrison, pt's cousin.  It will be a LIMITED DNR, since current measures are allowed to contiinue (so no compressions or cardioversion or calling a code blue --but continuing ventilation, using pressors or antiarrythmics is OK.  IF he suffers  cardio-pulmonary arrest after being extubated (if he gets extubated) then re-intubation would not be desired.  So --he is not to be re-intubated.    4)  I spoke in detail about the precipitating causes of patient's multi-organ illnesses (underlying Afib, leg edema/ ulcers, and cirrhosis with an acute pneumonia making all of his organs function very badly) and I went through each problem as detailed in the IMPRESSION list below in  this note.  I mentioned considering withdrawal of aggressive care and changing patient to comfort care. The decision was made in talking with Aaron Harrison (pt's cousin) to continue current aggressive care through the weekend. Then, on Monday, I will check on patient's condition and talk with his doctors and call Aaron Harrison back. If, by that time, the patient has not significantly improved, withdrawal of care or at least a de-escalation of care will be initiated.      REVIEW OF SYSTEMS:  Patient is not able to provide ROS since he is intubated and sedated and when sedation is decreased, he becomes agitated and not interactive with his environment.  SUPPORT SYSTEM: Pt has limited support. He has no immediate family.  His cousin, Aaron Harrison [phone] and Cell [phone]) is the closest relative and now, the one to be advised about patient's condition --and also to be asked about decisions.    SOCIAL HISTORY:  reports that he has never smoked. He has never used smokeless tobacco. He reports that he does not drink alcohol or use illicit drugs.  LEGAL DOCUMENTS:  None in hard/ paper chart  CODE STATUS:  DNR  PAST MEDICAL HISTORY: Past Medical History  Diagnosis Date  . Chronic atrial fibrillation     a. not on long term anticoagulation  . Pneumonia 2011  . Chronic systolic CHF (congestive heart failure)   . Cellulitis     PAST SURGICAL HISTORY: History reviewed. No pertinent past surgical history.  ALLERGIES:  is allergic to penicillins.  MEDICATIONS:  Current Facility-Administered Medications  Medication Dose Route Frequency Provider Last Rate Last Dose  . amiodarone (NEXTERONE PREMIX) 360 MG/200ML (1.8 mg/mL) IV infusion  30 mg/hr Intravenous Continuous Wellington Hampshire, MD 16.7 mL/hr at 06/15/15 1117 30 mg/hr at 06/15/15 1117  . ammonium lactate (LAC-HYDRIN) 12 % lotion   Topical PRN Florene Glen, MD      . Ampicillin-Sulbactam (UNASYN) 3 g in sodium chloride 0.9 % 100 mL IVPB   3 g Intravenous Q6H Flora Lipps, MD   3 g at 06/15/15 1314  . antiseptic oral rinse solution (CORINZ)  7 mL Mouth Rinse QID Demetrios Loll, MD   7 mL at 06/15/15 1216  . budesonide (PULMICORT) nebulizer solution 0.5 mg  0.5 mg Nebulization BID Flora Lipps, MD   0.5 mg at 06/15/15 0818  . chlorhexidine gluconate (PERIDEX) 0.12 % solution 15 mL  15 mL Mouth Rinse BID Demetrios Loll, MD   15 mL at 06/15/15 1015  . clonazePAM (KLONOPIN) tablet 0.5 mg  0.5 mg Oral BID Flora Lipps, MD   0.5 mg at 06/15/15 1030  . famotidine (PEPCID) tablet 20 mg  20 mg Oral Q24H Flora Lipps, MD      . feeding supplement (PRO-STAT SUGAR FREE 64) liquid 30 mL  30 mL Oral BID Flora Lipps, MD   30 mL at 06/15/15 1015  . feeding supplement (VITAL HIGH PROTEIN) liquid 1,000 mL  1,000 mL Per Tube Continuous Flora Lipps, MD      . fentaNYL 2576mg in NS 2534m(1030mml) infusion-PREMIX  10 mcg/hr Intravenous Continuous KurFlora LippsD 30 mL/hr at 06/15/15 1300 300 mcg/hr at 06/15/15 1300  . free water 25 mL  25 mL Per Tube 6 times per day KurFlora LippsD   25 mL at 06/15/15 1213  . heparin injection 1,000-6,000 Units  1,000-6,000 Units CRRT PRN Munsoor Lateef, MD      . heparin injection 5,000 Units  5,000 Units Subcutaneous 3 times per day SonFritzi MandesD   5,000 Units at 06/15/15 1345  . ipratropium-albuterol (DUONEB) 0.5-2.5 (3) MG/3ML nebulizer solution 3 mL  3 mL Nebulization Q6H KurFlora LippsD   3 mL at 06/15/15 0818  . midazolam (VERSED) injection 2-4 mg  2-4 mg Intravenous Q1H PRN KurFlora LippsD   2 mg at 06/15/15 0416  . norepinephrine (LEVOPHED) 4mg70m D5W 250mL35mmix infusion  0-40 mcg/min Intravenous Titrated Sona Fritzi Mandes  0 mcg/min at 06/14/15 0945  . pureflow IV solution for Dialysis   CRRT Continuous Munsoor Lateef, MD 2,500 mL/hr at 06/15/15 0756    . rocuronium (ZEMURON) injection   Intravenous PRN Cory Hinda Kehr  100 mg at 06/10/15 1531    Vital Signs: BP 89/77 mmHg  Pulse 90  Temp(Src) 97.3 F (36.3 C)  (Other (Comment))  Resp 24  Ht _0  (1.727 m)  Wt 124.6 kg (274 lb 11.1 oz)  BMI 41.78 kg/m2  SpO2 100% Filed Weights   06/13/15 0359 06/14/15 0500 06/15/15 0440  Weight: 128 kg (282 lb 3 oz) 126.3 kg (278 lb 7.1 oz) 124.6 kg (274 lb 11.1 oz)    Estimated body mass index is 41.78 kg/(m^2) as calculated from the following:   Height as of this encounter: _1  (1.727 m).   Weight as of this encounter: 124.6 kg (274 lb 11.1 oz).  PERFORMANCE STATUS (ECOG) : 4 - Bedbound  PHYSICAL EXAM: Sedated and intubated Neck  w/o JVD Heart irreg irreg pulses 2plus radials Lungs with vent sounds and otherwise clear anteriorly Abd obese and nl BS Ext lower legs have edema and scabs from chronic venous stasis skin changes  LABS: CBC:    Component Value Date/Time   WBC 5.9 06/14/2015 0440   WBC 19.5* 07/16/2013 0239   HGB 10.7* 06/14/2015 0440   HGB 15.2 07/16/2013 0239   HCT 32.9* 06/14/2015 0440   HCT 43.8 07/16/2013 0239   PLT 57* 06/14/2015 0440   PLT 179 07/16/2013 0239   MCV 103.3* 06/14/2015 0440   MCV 90 07/16/2013 0239   NEUTROABS 7.7* 06/13/2015 0143   LYMPHSABS 0.3* 06/13/2015 0143   MONOABS 0.2 06/13/2015 0143   EOSABS 0.1 06/13/2015 0143   BASOSABS 0.0 06/13/2015 0143   Comprehensive Metabolic Panel:    Component Value Date/Time   NA 141 06/15/2015 1201   NA 137 07/16/2013 0239   K 3.5 06/15/2015 1201   K 3.0* 07/16/2013 0239   CL 109 06/15/2015 1201   CL 103 07/16/2013 0239   CO2 26 06/15/2015 1201   CO2 25 07/16/2013 0239   BUN 27* 06/15/2015 1201   BUN 19* 07/16/2013 0239   CREATININE 1.71* 06/15/2015 1201   CREATININE 1.53* 07/16/2013 0239   GLUCOSE 122* 06/15/2015 1201   GLUCOSE 198* 07/16/2013 0239   CALCIUM 7.7* 06/15/2015 1201   CALCIUM 8.8 07/16/2013 0239   AST 108* 06/11/2015 1519   AST 22 07/16/2013 0239   ALT 112* 06/11/2015 1519   ALT 23 07/16/2013 0239   ALKPHOS 68 06/11/2015 1519   ALKPHOS 91 07/16/2013 0239   BILITOT 5.4* 06/11/2015 1519    BILITOT 1.8* 07/16/2013 0239   PROT 6.0* 06/11/2015 1519   PROT 7.3 07/16/2013 0239   ALBUMIN 2.3* 06/15/2015 1201   ALBUMIN 4.1 07/16/2013 0239   TESTS:  Echo 06/11/15: - Left ventricle: The cavity size was normal. There was moderate concentric hypertrophy. Systolic function was moderately to severely reduced. The estimated ejection fraction was in the range of 30% to 35%. Diffuse hypokinesis. - Left atrium: The atrium was moderately dilated. - Right ventricle: The cavity size was mildly dilated. Wall thickness was normal. Systolic function was mildly reduced. - Right atrium: The atrium was moderately to severely dilated. - Pulmonary arteries: Systolic pressure was within the normal range. PA peak pressure: 32 mm Hg (S). - Inferior vena cava: The vessel was dilated. The respirophasic diameter changes were blunted (< 50%). - Pericardium, extracardiac: A small pericardial effusion was identified posterior to the heart and along the left ventricular free wall.  CXR 06/15/15: Slight interval improvement in pulmonary interstitial edema despite mild hypo inflation. The support tubes are in reasonable position.  IMPRESSION: 1)  Acute Respiratory Failure due to pneumonia (RUL infiltrate on adm CXR) and pulmonary edema/ CHF ---intubated in the ED 06/10/15 ---extubated 06/12/15 but failed BIPAP and was re-intubated urgently on 06/13/15 ---failing attempts to extubate currently 2)  Community Acquired Pneumonia ---Sputum Cx is growing MSSA  (Bld and Ur Cxs neg) ---Treated with ABX as per ID consultant (Zosyn/ Vanco --then Unasyn  3)  Afib with RVR w/ HR of about 150 at adm  ---Followed by Cardiology Thurmond Butts Dunn PA-C and Aris Everts MD)  ---On Amiodarone and anticoagulated with heparin drip 4) Acute on Chronic Systolic Congestive Heart Failure with EF of 30-35% ---with chronic lower leg edema worse recently 5) Suspected to have ischemic heart disease ---Mild elevations  of troponin noted without diagnosis of MI (demand ischemia)  6) Acute Renal Failure on Chronic Kidney Disease stage 3 ---Likely due to ATN related to poor cardiac function  ---with baseline creatinine 1.3 and proteinuria.  ---On CRRT since 06/13/15 per Nephrologist, Dr. Holley Raring 7) Leg Cellulitis (related to venous stasis edema)--treated with Unasyn per ID 8) Anemia of chronic disease and kidney disease --BUT has macrocytosis with MCV of 578 9) Metabolic Acidosis due to critical illness/ renal disease 10) Electrolyte Disarray --managed daily   11) Unclear history of 'liver disease'.   ---Ammonia level only 26 on 06/10/15 ---LFTS were quite elevated on 06/10/15 with AST 301 and ALT 150 adn Total Bilirubin 6.7 with Alk Phos only 87.  AST is now down to 108 and ALT is 112 and Total bili is 5.4 now. ---He was on lactulose at home, but no details of the etiology, work-up, or management of his supposed cirrhosis is known.  ---He is said to have ascites, but I do not see an imaging study confirming this  ---there is not any imaging study of his liver in the records I am able to review 12) thrombocytopenia --likely related to liver disease  Thus he has the following organ systems involved, ill, failing:  Neuro --not waking up well despite low ammonia levels and decreases in sedation GI --liver disease is ongoing but ill-defined (would recommend imaging so I can talk to the cousin in detail about this) Pulmonary --pneumonia, chf Cardiovascular--suspect CAD, has AFib, has systolic CHF, has cardiomyopathy, has leg edema  Skin --leg edema with chronic venous stasis skin changes and infections Renal --acute on chronic renal failure with proteinuria now on CRRT Hematologic --anemia , significant thrombocytopenia, macrocytosis Immune --critical illness reduces immune responses Urologic --he has foley and this will lead to infection Endocrine --with variable glucose readings    See Top of Note for  PLAN  REFERRALS TO BE ORDERED:  None as yet.   More than 50% of the visit was spent in counseling/coordination of care: Yes  Time Spent: 110 minutes  Addendum:  I discussed ( with Dr. Alphonsus Sias) ordering an Abdominal US --basically to get more information about pts cirrhosis/ liver dz-- for the sake of the cousin having as much information as possible about all of pts organ systems.  We are all a bit in the dark about this diagnosis, history, etc, so an ultrasound would be helpful in terms of having something objective in terms of imaging of his liver. I updated Dr. Alphonsus Sias about the plan from a palliative care perspective.

## 2015-06-15 NOTE — Care Management (Signed)
It was planned to attempt to wake up today and informed that patient became extremely agitated.  Trial was not successful

## 2015-06-15 NOTE — Progress Notes (Signed)
[facility]  ROUNDING NOTE   Subjective:  Still critically ill. Oliguria persists. Was net negative yesterday but was having diarrhea. Tolearting CRRT well.  Phos found to be low.   Objective:  Vital signs in last 24 hours:  Temp:  [96.1 F (35.6 C)-97.7 F (36.5 C)] 97.3 F (36.3 C) (08/19 0600) Pulse Rate:  [45-103] 90 (08/19 0600) Resp:  [21-27] 24 (08/19 0600) BP: (90-120)/(55-97) 90/55 mmHg (08/19 0600) SpO2:  [95 %-100 %] 98 % (08/19 0600) FiO2 (%):  [35 %] 35 % (08/19 0359) Weight:  [124.6 kg (274 lb 11.1 oz)] 124.6 kg (274 lb 11.1 oz) (08/19 0440)  Weight change: -1.7 kg (-3 lb 12 oz) Filed Weights   06/13/15 0359 06/14/15 0500 06/15/15 0440  Weight: 128 kg (282 lb 3 oz) 126.3 kg (278 lb 7.1 oz) 124.6 kg (274 lb 11.1 oz)    Intake/Output: I/O last 3 completed shifts: In: 1829 [I.V.:1383; Other:10; NG/GT:1708; IV Piggyback:950] Out: 9371 [Urine:200; Emesis/NG output:700; Other:3231; IRCVE:9381]   Intake/Output this shift:     Physical Exam: General: Critically ill appearing  Head: Normocephalic, atraumatic. ETT in place  Eyes: Eyes closed  Neck: Supple, trachea midline  Lungs:  Bilateral rhonchi vent assisted  Heart: Irregular S1S2 no rubs  Abdomen:  Soft, nontender, mild distension  Extremities:  3+ peripheral edema, less tight today  Neurologic: Intubated/sedated  Skin: ulcertaion bilateral lower extremeties  Access: Femoral dialysis catheter    Basic Metabolic Panel:  Recent Labs Lab 06/14/15 0805 06/14/15 1211 06/14/15 1610 06/14/15 1956 06/14/15 2354 06/15/15 0345  NA 142 143 144 143 142 142  K 3.4* 3.5 3.6 3.7 3.6 3.5  CL 109 109 109 108 110 111  CO2 26 27 26 28 27 28   GLUCOSE 117* 117* 104* 94 93 121*  BUN 38* 35* 33* 30* 30* 29*  CREATININE 2.41* 2.08* 1.99* 1.88* 1.77* 1.79*  CALCIUM 7.8* 8.0* 8.4* 8.2* 8.2* 8.1*  MG 1.9 2.0 2.0  --  1.9 1.9  PHOS 2.8 2.7 2.8 2.7 2.1* 2.0*    Liver Function Tests:  Recent  Labs Lab 06/10/15 1435 06/11/15 1519  06/14/15 1211 06/14/15 1610 06/14/15 1956 06/14/15 2354 06/15/15 0345  AST 301* 108*  --   --   --   --   --   --   ALT 150* 112*  --   --   --   --   --   --   ALKPHOS 87 68  --   --   --   --   --   --   BILITOT 6.7* 5.4*  --   --   --   --   --   --   PROT 7.2 6.0*  --   --   --   --   --   --   ALBUMIN 3.5 2.8*  < > 2.4* 2.6* 2.5* 2.4* 2.4*  < > = values in this interval not displayed.  Recent Labs Lab 06/10/15 1435  LIPASE 20*    Recent Labs Lab 06/10/15 1435  AMMONIA 26    CBC:  Recent Labs Lab 06/10/15 1435 06/12/15 0300 06/13/15 0143 06/14/15 0440  WBC 11.1* 10.6 8.4 5.9  NEUTROABS 9.8*  --  7.7*  --   HGB 13.4 12.9* 12.2* 10.7*  HCT 42.0 40.0 38.5* 32.9*  MCV 105.2* 105.9* 107.8* 103.3*  PLT 155 98* 85* 57*    Cardiac Enzymes:  Recent Labs Lab 06/09/15 2025 06/10/15 1435 06/11/15 0040  06/11/15 0312  TROPONINI 0.26* 0.20* 0.22* 0.27*    BNP: Invalid input(s): POCBNP  CBG:  Recent Labs Lab 06/14/15 1608 06/14/15 1955 06/14/15 2003 06/14/15 2348 06/15/15 0342  GLUCAP 91 66 76 80 127*    Microbiology: Results for orders placed or performed during the hospital encounter of 06/10/15  Blood Culture (routine x 2)     Status: None (Preliminary result)   Collection Time: 06/10/15  2:35 PM  Result Value Ref Range Status   Specimen Description BLOOD RIGHT WRIST  Final   Special Requests BOTTLES DRAWN AEROBIC AND ANAEROBIC  1CC  Final   Culture NO GROWTH 4 DAYS  Final   Report Status PENDING  Incomplete  Blood Culture (routine x 2)     Status: None (Preliminary result)   Collection Time: 06/10/15  2:40 PM  Result Value Ref Range Status   Specimen Description BLOOD LEFT ASSIST CONTROL  Final   Special Requests   Final    BOTTLES DRAWN AEROBIC AND ANAEROBIC  AER 6CC ANA 4CC   Culture NO GROWTH 4 DAYS  Final   Report Status PENDING  Incomplete  Urine culture     Status: None   Collection Time:  06/10/15  4:42 PM  Result Value Ref Range Status   Specimen Description URINE, RANDOM  Final   Special Requests NONE  Final   Culture 4,000 COLONIES/mL INSIGNIFICANT GROWTH  Final   Report Status 06/12/2015 FINAL  Final  MRSA PCR Screening     Status: None   Collection Time: 06/10/15  6:27 PM  Result Value Ref Range Status   MRSA by PCR NEGATIVE NEGATIVE Final    Comment:        The GeneXpert MRSA Assay (FDA approved for NASAL specimens only), is one component of a comprehensive MRSA colonization surveillance program. It is not intended to diagnose MRSA infection nor to guide or monitor treatment for MRSA infections.   Culture, sputum-assessment     Status: None   Collection Time: 06/11/15  2:50 PM  Result Value Ref Range Status   Specimen Description SPUTUM  Final   Special Requests NONE  Final   Sputum evaluation THIS SPECIMEN IS ACCEPTABLE FOR SPUTUM CULTURE  Final   Report Status 06/13/2015 FINAL  Final  Culture, respiratory (NON-Expectorated)     Status: None   Collection Time: 06/11/15  2:50 PM  Result Value Ref Range Status   Specimen Description SPUTUM  Final   Special Requests NONE Reflexed from [id number]  Final   Gram Stain   Final    MODERATE WBC SEEN RARE GRAM POSITIVE COCCI GOOD SPECIMEN - 80-90% WBCS    Culture MODERATE GROWTH STAPHYLOCOCCUS AUREUS  Final   Report Status 06/14/2015 FINAL  Final   Organism ID, Bacteria STAPHYLOCOCCUS AUREUS  Final      Susceptibility   Staphylococcus aureus - MIC*    CIPROFLOXACIN <=0.5 SENSITIVE Sensitive     GENTAMICIN <=0.5 SENSITIVE Sensitive     OXACILLIN 0.5 SENSITIVE Sensitive     TRIMETH/SULFA <=10 SENSITIVE Sensitive     CEFOXITIN SCREEN NEGATIVE Sensitive     Inducible Clindamycin NEGATIVE Sensitive     TETRACYCLINE Value in next row Sensitive      SENSITIVE<=1    * MODERATE GROWTH STAPHYLOCOCCUS AUREUS  Wound culture     Status: None (Preliminary result)   Collection Time: 06/13/15  1:56 PM  Result Value Ref  Range Status   Specimen Description LEG  Final   Special Requests Normal  Final   Gram Stain PENDING  Incomplete   Culture   Final    LIGHT GROWTH STAPHYLOCOCCUS AUREUS SUSCEPTIBILITIES TO FOLLOW    Report Status PENDING  Incomplete  Wound culture     Status: None (Preliminary result)   Collection Time: 06/13/15  1:58 PM  Result Value Ref Range Status   Specimen Description LEG  Final   Special Requests Normal  Final   Gram Stain PENDING  Incomplete   Culture NO GROWTH < 24 HOURS  Final   Report Status PENDING  Incomplete    Coagulation Studies: No results for input(s): LABPROT, INR in the last 72 hours.  Urinalysis: No results for input(s): COLORURINE, LABSPEC, PHURINE, GLUCOSEU, HGBUR, BILIRUBINUR, KETONESUR, PROTEINUR, UROBILINOGEN, NITRITE, LEUKOCYTESUR in the last 72 hours.  Invalid input(s): APPERANCEUR    Imaging: Dg Abd 1 View  06/13/2015   CLINICAL DATA:  Status post OG tube placement.  EXAM: ABDOMEN - 1 VIEW  COMPARISON:  Single view of the abdomen 06/10/2015.  FINDINGS: OG tube is in place with the tip in the distal stomach. The bowel gas pattern is unremarkable.  IMPRESSION: OG tube tip is in the distal stomach.   Electronically Signed   By: Inge Rise M.D.   On: 06/13/2015 11:32   Dg Chest Port 1 View  06/13/2015   CLINICAL DATA:  Hypoxia  EXAM: PORTABLE CHEST - 1 VIEW  COMPARISON:  June 12, 2015  FINDINGS: Endotracheal tube tip is at the carina. Central catheter tip is in the superior cava. Nasogastric tube tip and side port are below the diaphragm. No pneumothorax. There is alveolar edema throughout much of the right lung. There is also alveolar edema in both lower lobes. Heart is enlarged with pulmonary venous hypertension. Suspect small layering effusion on the right. No adenopathy.  IMPRESSION: Tube and catheter positions as described without pneumothorax. Note that the endotracheal tube tip is at the carina.  Congestive heart failure with persistent areas  of alveolar edema. Stable cardiomegaly. Suspect layering effusion on the right.  Critical Value/emergent results were called by telephone at the time of interpretation on 06/13/2015 at 10:17 am to Delton See, RN , who verbally acknowledged these results.   Electronically Signed   By: Lowella Grip III M.D.   On: 06/13/2015 10:18     Medications:   . amiodarone 30 mg/hr (06/14/15 2236)  . fentaNYL infusion INTRAVENOUS 25 mcg/hr (06/15/15 0657)  . norepinephrine    . pureflow 2,500 mL/hr at 06/15/15 0207   . ampicillin-sulbactam (UNASYN) IV  3 g Intravenous Q6H  . antiseptic oral rinse  7 mL Mouth Rinse QID  . budesonide (PULMICORT) nebulizer solution  0.5 mg Nebulization BID  . chlorhexidine gluconate  15 mL Mouth Rinse BID  . famotidine (PEPCID) IV  20 mg Intravenous Q24H  . feeding supplement (PRO-STAT SUGAR FREE 64)  30 mL Oral BID  . feeding supplement (VITAL HIGH PROTEIN)  1,000 mL Per Tube Q24H  . free water  25 mL Per Tube 6 times per day  . heparin subcutaneous  5,000 Units Subcutaneous 3 times per day  . ipratropium-albuterol  3 mL Nebulization Q6H   ammonium lactate, heparin, midazolam, rocuronium  Assessment/ Plan:  64 y.o. male with a PMHx of congestive heart failure ejection fraction 30-35%, prior pneumonia, lower extremity edema with cellulitis, atrial fibrillation who was admitted to [facility] on 06/10/2015 for evaluation of altered mental status and shortness of breath. Upon admission the patient underwent intubation with transition to  a mechanical ventilation.  1. Acute renal failure/chronic kidney disease stage III with baseline creatinine 1.3/proteinuria. The patient most likely has acute renal failure secondary to altered cardiorenal hemodynamics. His ejection fraction is low at 30%. -Critical illness and oliguria persist, will therefore continue CRRT at this time, will continue UF rate of 125cc/hr as pt has been volume overloaded.   2. Acute respiratory failure.  Pulm/CC following.  Continue UF of 125cc/hr to aid weaning from the ventilator.   3. Acute systolic heart failure. CXR on the 17th showed pulmonary edema, will continue UF with CRRT.  Consider repeating chest xray.   LOS: 5 Aaron Harrison 8/19/20167:18 AM

## 2015-06-15 NOTE — Progress Notes (Signed)
[facility] [place] NAME: Aaron Harrison    MR#:  [id number]  DATE OF BIRTH:  11-03-1950  SUBJECTIVE:  Remains intubated and on the vent. On IV Amiodarone,Heparin gtt,IV fentanyl. Sedated No fever  CRRT being tolerated TF+  REVIEW OF SYSTEMS:   Review of Systems  Unable to perform ROS: intubated   Tolerating Diet:TF Tolerating PT: no,on vent  DRUG ALLERGIES:   Allergies  Allergen Reactions  . Penicillins Nausea And Vomiting    VITALS:  Blood pressure 89/77, pulse 90, temperature 97.3 F (36.3 C), temperature source Other (Comment), resp. rate 24, height 5\' 8"  (1.727 m), weight 124.6 kg (274 lb 11.1 oz), SpO2 100 %.  PHYSICAL EXAMINATION:   Physical Exam  GENERAL:  64 y.o.-year-old patient lying in the bed with no acute distress. critically ill anasarc EYES: Pupils equal, round, reactive to light and accommodation. No scleral icterus. Extraocular muscles intact.  HEENT: Head atraumatic, normocephalic. Oropharynx and nasopharynx clear.  -orally intubated NECK:  Supple, no jugular venous distention. No thyroid enlargement, no tenderness.  LUNGS: Normal breath sounds bilaterally, no wheezing, rales, rhonchi. No use of accessory muscles of respiration. Left upper chest CL+ CARDIOVASCULAR: S1, S2 normal. No murmurs, rubs, or gallops.  ABDOMEN: Soft, nontender, nondistended. Bowel sounds present. No organomegaly or mass.  -severe scrotal edema EXTREMITIES: bilateral ++++ edema b/l with weeping skin ulcers on tibial shin NEUROLOGIC: sedated and on the vent PSYCHIATRIC: on the vent SKIN:bilateral LE cellulitis and weeping skin ulcers over the tibial shin  LABORATORY PANEL:   CBC  Recent Labs Lab 06/14/15 0440  WBC 5.9  HGB 10.7*  HCT 32.9*  PLT 57*    Chemistries   Recent Labs Lab 06/11/15 1519  06/15/15 0802  NA 138  < > 141  K 4.8  < > 3.9  CL 106  < > 107  CO2 24  < > 28  GLUCOSE 109*  < > 113*  BUN  55*  < > 27*  CREATININE 2.64*  < > 1.70*  CALCIUM 7.9*  < > 8.1*  MG  --   < > 1.8  AST 108*  --   --   ALT 112*  --   --   ALKPHOS 68  --   --   BILITOT 5.4*  --   --   < > = values in this interval not displayed.  Cardiac Enzymes  Recent Labs Lab 06/11/15 0312  TROPONINI 0.27*    RADIOLOGY:  Dg Chest Port 1 View  06/15/2015   CLINICAL DATA:  Intubated patient, acute respiratory failure, community-acquired pneumonia, CHF.  EXAM: PORTABLE CHEST - 1 VIEW  COMPARISON:  Portable chest x-ray of June 13, 2015  FINDINGS: The lungs are mildly hypoinflated. The interstitial markings are increased bilaterally. The retrocardiac region remains dense. There is no pneumothorax or significant pleural effusion. The cardiac silhouette remains enlarged. The pulmonary vascularity is engorged. The endotracheal tube tip lies 3.2 cm above the carina. The esophagogastric tube tip projects below the inferior margin of the image. The left subclavian venous catheter tip projects over the midportion of the SVC.  IMPRESSION: Slight interval improvement in pulmonary interstitial edema despite mild hypo inflation. The support tubes are in reasonable position.   Electronically Signed   By: David  Martinique M.D.   On: 06/15/2015 08:45     ASSESSMENT AND PLAN:  64 y.o. male with a known history of A. fib, CHF came in with  *  Acute respiratory failure with hypoxia: intubated in ED, extubated on August 15 - Reintubated on August 17 due to increased work of breathing. - On heparin, amiodarone and fentanyl drip.  * Sepsis with multiorgan failure: Likely hospital-acquired pneumonia: continue Unasyn  Infectious disease input appreciated  -BC negative -wbc stable -no fever  * MSSA Pneumonia- IV Unasyn  * Acute renal failure: Possible cardiorenal syndrome due to underlying CHF. Appreciate nephrology input - temporary dialysis catheter placed in the right groin and CRRT started 8/17  * Acute on chronic systolic  CHF, ejection fraction 35%:  Unable to continue diuresis at this time due to hypotension  Echo showing ejection fraction 30% to 35% -CRRT with some UF -positive by 31K L, severe anasarca  * A. fib with RVR:  Not able to use rate controlling medication due to hypotension. On IV amiodaronegtt -on heparin gtt(not sure who placed the order). No mention of need for heparin gttt per cardiology notes -will d/c gtt -In the past pt had refused anticoagulation -Continue aspirin for now  * Elevated troponin: Due to supply demand ischemia  * Bilateral lower extremity cellulitis: Continue Unasyn  * Hyperkalemia: Resolved  * Ascites/Abnormal liver function test: GI following, on lactulose  *palliative care consult noted. Pt has a very poor prognosis given multiorgan failure.no family. Has 2 friends.  Case discussed with Care Management/Social Worker. Management plans discussed with the patient, family and they are in agreement.  CODE STATUS: Full DVT Prophylaxis: sq heparin  TOTAL Critical TIME TAKING CARE OF THIS PATIENT: 64minutes.  >50% time spent on counselling and coordination of care    Rameses Ou M.D on 06/15/2015 at 11:52 AM  Between 7am to 6pm - Pager - [phone]  After 6pm go to [email or web address] - password [email or web address] [facility]  [facility]  Office  [phone]  CC: Primary care physician; No PCP Per Patient

## 2015-06-15 NOTE — Progress Notes (Signed)
[place] INFECTIOUS DISEASE PROGRESS NOTE Date of Admission:  06/10/2015     ID: Aaron Harrison is a 64 y.o. male with MSSA bacteremia, LE cellulitis  Principal Problem:   Acute respiratory failure Active Problems:   Community acquired pneumonia   Acute renal failure   Acute CHF   Hyperkalemia   Ascites   Bilateral lower leg cellulitis   Atrial fibrillation   Sepsis   Coagulopathy   Acute respiratory failure with hypoxemia   Multi-organ failure with liver failure   Subjective: On CRRT, no fevers,  ROS  Eleven systems are reviewed and negative except per hpi  Medications:  Antibiotics Given (last 72 hours)    Date/Time Action Medication Dose Rate   06/12/15 1748 Given   Ampicillin-Sulbactam (UNASYN) 3 g in sodium chloride 0.9 % 100 mL IVPB 3 g 100 mL/hr   06/13/15 0011 Given   Ampicillin-Sulbactam (UNASYN) 3 g in sodium chloride 0.9 % 100 mL IVPB 3 g 100 mL/hr   06/13/15 0448 Given   Ampicillin-Sulbactam (UNASYN) 3 g in sodium chloride 0.9 % 100 mL IVPB 3 g 100 mL/hr   06/13/15 1344 Given  [pharm delay]   Ampicillin-Sulbactam (UNASYN) 3 g in sodium chloride 0.9 % 100 mL IVPB 3 g 100 mL/hr   06/13/15 1721 Given   Ampicillin-Sulbactam (UNASYN) 3 g in sodium chloride 0.9 % 100 mL IVPB 3 g 100 mL/hr   06/13/15 2335 Given   Ampicillin-Sulbactam (UNASYN) 3 g in sodium chloride 0.9 % 100 mL IVPB 3 g 100 mL/hr   06/14/15 0516 Given   Ampicillin-Sulbactam (UNASYN) 3 g in sodium chloride 0.9 % 100 mL IVPB 3 g 100 mL/hr   06/14/15 1141 Given   Ampicillin-Sulbactam (UNASYN) 3 g in sodium chloride 0.9 % 100 mL IVPB 3 g 100 mL/hr   06/14/15 1802 Given   Ampicillin-Sulbactam (UNASYN) 3 g in sodium chloride 0.9 % 100 mL IVPB 3 g 100 mL/hr   06/14/15 2339 Given   Ampicillin-Sulbactam (UNASYN) 3 g in sodium chloride 0.9 % 100 mL IVPB 3 g 100 mL/hr   06/15/15 0543 Given   Ampicillin-Sulbactam (UNASYN) 3 g in sodium chloride 0.9 % 100 mL IVPB 3 g 100 mL/hr   06/15/15 1314 Given    Ampicillin-Sulbactam (UNASYN) 3 g in sodium chloride 0.9 % 100 mL IVPB 3 g 100 mL/hr     . ampicillin-sulbactam (UNASYN) IV  3 g Intravenous Q6H  . antiseptic oral rinse  7 mL Mouth Rinse QID  . budesonide (PULMICORT) nebulizer solution  0.5 mg Nebulization BID  . chlorhexidine gluconate  15 mL Mouth Rinse BID  . clonazePAM  0.5 mg Oral BID  . famotidine  20 mg Oral Q24H  . feeding supplement (PRO-STAT SUGAR FREE 64)  30 mL Oral BID  . free water  25 mL Per Tube 6 times per day  . heparin subcutaneous  5,000 Units Subcutaneous 3 times per day  . ipratropium-albuterol  3 mL Nebulization Q6H  . sodium phosphate  Dextrose 5% IVPB  20 mmol Intravenous Once    Objective: Vital signs in last 24 hours: Temp:  [96.3 F (35.7 C)-97.7 F (36.5 C)] 97.3 F (36.3 C) (08/19 0700) Pulse Rate:  [45-103] 90 (08/19 0600) Resp:  [21-24] 24 (08/19 0700) BP: (89-120)/(55-97) 89/77 mmHg (08/19 0700) SpO2:  [97 %-100 %] 100 % (08/19 0813) FiO2 (%):  [35 %] 35 % (08/19 1135) Weight:  [124.6 kg (274 lb 11.1 oz)] 124.6 kg (274 lb 11.1 oz) (  08/19 0440) Constitutional: obese, intubated, critically ill HENT: anicteric Mouth/Throat:ETT tube in place Cardiovascular: IRR Pulmonary/Chest: mech breath sounds Abdominal: obese, distedned, tympanic  Neurological: intubated and sedated Ext 2+ bil lE edema Skin: Bil LE with chronic skin changes, brawny edema Has on R ant leg an open linear wound with mild drainage L post calf with open ulcer, some drainage Access L [place] TLC, R groin temp HD cath   Lab Results  Recent Labs  06/13/15 0143  06/14/15 0440  06/15/15 0802 06/15/15 1201  WBC 8.4  --  5.9  --   --   --   HGB 12.2*  --  10.7*  --   --   --   HCT 38.5*  --  32.9*  --   --   --   NA 141  < > 142  < > 141 141  K 4.6  < > 3.4*  < > 3.9 3.5  CL 107  < > 108  < > 107 109  CO2 26  < > 26  < > 28 26  BUN 66*  < > 41*  < > 27* 27*  CREATININE 3.64*  < > 2.47*  < > 1.70* 1.71*  < > = values in  this interval not displayed.  Results for orders placed or performed during the hospital encounter of 06/10/15  Blood Culture (routine x 2)     Status: None   Collection Time: 06/10/15  2:35 PM  Result Value Ref Range Status   Specimen Description BLOOD RIGHT WRIST  Final   Special Requests BOTTLES DRAWN AEROBIC AND ANAEROBIC  1CC  Final   Culture NO GROWTH 5 DAYS  Final   Report Status 06/15/2015 FINAL  Final  Blood Culture (routine x 2)     Status: None   Collection Time: 06/10/15  2:40 PM  Result Value Ref Range Status   Specimen Description BLOOD LEFT ASSIST CONTROL  Final   Special Requests   Final    BOTTLES DRAWN AEROBIC AND ANAEROBIC  AER 6CC ANA 4CC   Culture NO GROWTH 5 DAYS  Final   Report Status 06/15/2015 FINAL  Final  Urine culture     Status: None   Collection Time: 06/10/15  4:42 PM  Result Value Ref Range Status   Specimen Description URINE, RANDOM  Final   Special Requests NONE  Final   Culture 4,000 COLONIES/mL INSIGNIFICANT GROWTH  Final   Report Status 06/12/2015 FINAL  Final  MRSA PCR Screening     Status: None   Collection Time: 06/10/15  6:27 PM  Result Value Ref Range Status   MRSA by PCR NEGATIVE NEGATIVE Final    Comment:        The GeneXpert MRSA Assay (FDA approved for NASAL specimens only), is one component of a comprehensive MRSA colonization surveillance program. It is not intended to diagnose MRSA infection nor to guide or monitor treatment for MRSA infections.   Culture, sputum-assessment     Status: None   Collection Time: 06/11/15  2:50 PM  Result Value Ref Range Status   Specimen Description SPUTUM  Final   Special Requests NONE  Final   Sputum evaluation THIS SPECIMEN IS ACCEPTABLE FOR SPUTUM CULTURE  Final   Report Status 06/13/2015 FINAL  Final  Culture, respiratory (NON-Expectorated)     Status: None   Collection Time: 06/11/15  2:50 PM  Result Value Ref Range Status   Specimen Description SPUTUM  Final   Special Requests  NONE Reflexed from [id number]  Final   Gram Stain   Final    MODERATE WBC SEEN RARE GRAM POSITIVE COCCI GOOD SPECIMEN - 80-90% WBCS    Culture MODERATE GROWTH STAPHYLOCOCCUS AUREUS  Final   Report Status 06/14/2015 FINAL  Final   Organism ID, Bacteria STAPHYLOCOCCUS AUREUS  Final      Susceptibility   Staphylococcus aureus - MIC*    CIPROFLOXACIN <=0.5 SENSITIVE Sensitive     GENTAMICIN <=0.5 SENSITIVE Sensitive     OXACILLIN 0.5 SENSITIVE Sensitive     TRIMETH/SULFA <=10 SENSITIVE Sensitive     CEFOXITIN SCREEN NEGATIVE Sensitive     Inducible Clindamycin NEGATIVE Sensitive     TETRACYCLINE Value in next row Sensitive      SENSITIVE<=1    * MODERATE GROWTH STAPHYLOCOCCUS AUREUS  Wound culture     Status: None (Preliminary result)   Collection Time: 06/13/15  1:56 PM  Result Value Ref Range Status   Specimen Description LEG  Final   Special Requests Normal  Final   Gram Stain   Final    FEW WBC SEEN MODERATE GRAM NEGATIVE RODS FEW GRAM POSITIVE COCCI    Culture   Final    LIGHT GROWTH STAPHYLOCOCCUS AUREUS HOLDING FOR POSSIBLE ADDITIONAL PATHOGENS    Report Status PENDING  Incomplete   Organism ID, Bacteria STAPHYLOCOCCUS AUREUS  Final      Susceptibility   Staphylococcus aureus - MIC*    CIPROFLOXACIN <=0.5 SENSITIVE Sensitive     GENTAMICIN <=0.5 SENSITIVE Sensitive     OXACILLIN 0.5 SENSITIVE Sensitive     TRIMETH/SULFA <=10 SENSITIVE Sensitive     CEFOXITIN SCREEN NEGATIVE Sensitive     Inducible Clindamycin NEGATIVE Sensitive     TETRACYCLINE Value in next row Sensitive      SENSITIVE<=1    * LIGHT GROWTH STAPHYLOCOCCUS AUREUS  Wound culture     Status: None (Preliminary result)   Collection Time: 06/13/15  1:58 PM  Result Value Ref Range Status   Specimen Description LEG  Final   Special Requests Normal  Final   Gram Stain RARE WBC SEEN FEW GRAM NEGATIVE RODS   Final   Culture NO GROWTH 2 DAYS  Final   Report Status PENDING  Incomplete    Studies/Results: Dg Chest Port 1 View  06/15/2015   CLINICAL DATA:  Intubated patient, acute respiratory failure, community-acquired pneumonia, CHF.  EXAM: PORTABLE CHEST - 1 VIEW  COMPARISON:  Portable chest x-ray of June 13, 2015  FINDINGS: The lungs are mildly hypoinflated. The interstitial markings are increased bilaterally. The retrocardiac region remains dense. There is no pneumothorax or significant pleural effusion. The cardiac silhouette remains enlarged. The pulmonary vascularity is engorged. The endotracheal tube tip lies 3.2 cm above the carina. The esophagogastric tube tip projects below the inferior margin of the image. The left subclavian venous catheter tip projects over the midportion of the SVC.  IMPRESSION: Slight interval improvement in pulmonary interstitial edema despite mild hypo inflation. The support tubes are in reasonable position.   Electronically Signed   By: Jenah Vanasten  Martinique M.D.   On: 06/15/2015 08:45    Assessment/Plan: Aaron Harrison is a 64 y.o. male with a known history of A. fib, CHF, pneumonia, recurrent LE cellulitis. Admitted 8/14 with AMS, weakness, A fib RVR and respiratory failure. He had cellulitis in June and had wound cx with Pseudomona, Proteus and MSSA. Dced on bactrim and cipro - had unawraps and was following with CHF clinic and with vascular.  Since admit has had progressive multiorgan failure, is intubated, on CRRT. Sputum cx this admission growing MSSA. [place] negative. CXR on admit with RUL infiltrated. No fevers, wbc normalized. LE woudn cx with MSSA, other organisms being identified  ID Problems MSSA Pneumonia- RUL. Some concern for aspiration LE cellulitis - prior cx MSSA, pseudomonas, and proteus - not severe at this point but does have multiple wounds Sepsis with MOF  Recommendations Continue unasyn to cover MSSA and aspiration PNA as well as LE cellulitis. - Rec 14  day course of unasyn - can be changed to augmentin once stable  and improving Pending culture R leg wound and L post calf wound- eval for resistant organisms Thank you very much for the consult. Will follow with you.  [place], [place]   06/15/2015, 1:24 PM

## 2015-06-15 NOTE — Clinical Social Work Note (Signed)
Patient continues on CRRT and on ventilator. CRRT not able to be managed in SNF's and if patient needs to remain on a ventilator long term, will possibly need to make referral to [facility] in [place].  Shela Leff MSW,LCSW [phone]

## 2015-06-16 ENCOUNTER — Inpatient Hospital Stay: Payer: Medicaid Other

## 2015-06-16 DIAGNOSIS — J9601 Acute respiratory failure with hypoxia: Secondary | ICD-10-CM

## 2015-06-16 LAB — CBC
HEMATOCRIT: 34.3 % — AB (ref 40.0–52.0)
HEMOGLOBIN: 11 g/dL — AB (ref 13.0–18.0)
MCH: 33.3 pg (ref 26.0–34.0)
MCHC: 32 g/dL (ref 32.0–36.0)
MCV: 104.1 fL — AB (ref 80.0–100.0)
Platelets: 36 10*3/uL — ABNORMAL LOW (ref 150–440)
RBC: 3.29 MIL/uL — ABNORMAL LOW (ref 4.40–5.90)
RDW: 17.3 % — ABNORMAL HIGH (ref 11.5–14.5)
WBC: 5.4 10*3/uL (ref 3.8–10.6)

## 2015-06-16 LAB — RENAL FUNCTION PANEL
ALBUMIN: 2.4 g/dL — AB (ref 3.5–5.0)
ALBUMIN: 2.5 g/dL — AB (ref 3.5–5.0)
ALBUMIN: 2.4 g/dL — AB (ref 3.5–5.0)
ANION GAP: 6 (ref 5–15)
ANION GAP: 7 (ref 5–15)
ANION GAP: 7 (ref 5–15)
Albumin: 2.3 g/dL — ABNORMAL LOW (ref 3.5–5.0)
Albumin: 2.3 g/dL — ABNORMAL LOW (ref 3.5–5.0)
Albumin: 2.4 g/dL — ABNORMAL LOW (ref 3.5–5.0)
Anion gap: 8 (ref 5–15)
Anion gap: 5 (ref 5–15)
Anion gap: 6 (ref 5–15)
BUN: 19 mg/dL (ref 6–20)
BUN: 20 mg/dL (ref 6–20)
BUN: 21 mg/dL — ABNORMAL HIGH (ref 6–20)
BUN: 22 mg/dL — AB (ref 6–20)
BUN: 23 mg/dL — ABNORMAL HIGH (ref 6–20)
BUN: 23 mg/dL — ABNORMAL HIGH (ref 6–20)
CALCIUM: 8.3 mg/dL — AB (ref 8.9–10.3)
CALCIUM: 8.1 mg/dL — AB (ref 8.9–10.3)
CALCIUM: 8.1 mg/dL — AB (ref 8.9–10.3)
CALCIUM: 8.1 mg/dL — AB (ref 8.9–10.3)
CHLORIDE: 107 mmol/L (ref 101–111)
CHLORIDE: 108 mmol/L (ref 101–111)
CO2: 26 mmol/L (ref 22–32)
CO2: 26 mmol/L (ref 22–32)
CO2: 26 mmol/L (ref 22–32)
CO2: 26 mmol/L (ref 22–32)
CO2: 27 mmol/L (ref 22–32)
CO2: 29 mmol/L (ref 22–32)
CREATININE: 1.35 mg/dL — AB (ref 0.61–1.24)
CREATININE: 1.37 mg/dL — AB (ref 0.61–1.24)
CREATININE: 1.39 mg/dL — AB (ref 0.61–1.24)
CREATININE: 1.56 mg/dL — AB (ref 0.61–1.24)
Calcium: 7.9 mg/dL — ABNORMAL LOW (ref 8.9–10.3)
Calcium: 8.1 mg/dL — ABNORMAL LOW (ref 8.9–10.3)
Chloride: 107 mmol/L (ref 101–111)
Chloride: 105 mmol/L (ref 101–111)
Chloride: 105 mmol/L (ref 101–111)
Chloride: 105 mmol/L (ref 101–111)
Creatinine, Ser: 1.48 mg/dL — ABNORMAL HIGH (ref 0.61–1.24)
Creatinine, Ser: 1.42 mg/dL — ABNORMAL HIGH (ref 0.61–1.24)
GFR calc Af Amer: >60 mL/min (ref >60)
GFR calc Af Amer: >60 mL/min (ref >60)
GFR calc non Af Amer: 46 mL/min — ABNORMAL LOW (ref >60)
GFR calc non Af Amer: 51 mL/min — ABNORMAL LOW (ref >60)
GFR calc non Af Amer: 53 mL/min — ABNORMAL LOW (ref >60)
GFR calc non Af Amer: 54 mL/min — ABNORMAL LOW (ref >60)
GFR calc non Af Amer: 55 mL/min — ABNORMAL LOW (ref >60)
GFR, EST AFRICAN AMERICAN: 53 mL/min — AB (ref >60)
GFR, EST AFRICAN AMERICAN: 57 mL/min — AB (ref >60)
GFR, EST AFRICAN AMERICAN: 60 mL/min — AB (ref >60)
GFR, EST NON AFRICAN AMERICAN: 49 mL/min — AB (ref >60)
GLUCOSE: 100 mg/dL — AB (ref 65–99)
GLUCOSE: 106 mg/dL — AB (ref 65–99)
GLUCOSE: 122 mg/dL — AB (ref 65–99)
Glucose, Bld: 99 mg/dL (ref 65–99)
Glucose, Bld: 121 mg/dL — ABNORMAL HIGH (ref 65–99)
Glucose, Bld: 146 mg/dL — ABNORMAL HIGH (ref 65–99)
PHOSPHORUS: 2.4 mg/dL — AB (ref 2.5–4.6)
PHOSPHORUS: 2.6 mg/dL (ref 2.5–4.6)
PHOSPHORUS: 2.6 mg/dL (ref 2.5–4.6)
POTASSIUM: 3.7 mmol/L (ref 3.5–5.1)
POTASSIUM: 4 mmol/L (ref 3.5–5.1)
Phosphorus: 2.3 mg/dL — ABNORMAL LOW (ref 2.5–4.6)
Phosphorus: 2.4 mg/dL — ABNORMAL LOW (ref 2.5–4.6)
Phosphorus: 2.8 mg/dL (ref 2.5–4.6)
Potassium: 3.6 mmol/L (ref 3.5–5.1)
Potassium: 3.8 mmol/L (ref 3.5–5.1)
Potassium: 4 mmol/L (ref 3.5–5.1)
Potassium: 4 mmol/L (ref 3.5–5.1)
SODIUM: 137 mmol/L (ref 135–145)
SODIUM: 138 mmol/L (ref 135–145)
SODIUM: 138 mmol/L (ref 135–145)
SODIUM: 140 mmol/L (ref 135–145)
SODIUM: 141 mmol/L (ref 135–145)
Sodium: 142 mmol/L (ref 135–145)

## 2015-06-16 LAB — GLUCOSE, CAPILLARY
GLUCOSE-CAPILLARY: 81 mg/dL (ref 65–99)
GLUCOSE-CAPILLARY: 69 mg/dL (ref 65–99)
GLUCOSE-CAPILLARY: 65 mg/dL (ref 65–99)
GLUCOSE-CAPILLARY: 54 mg/dL — AB (ref 65–99)
GLUCOSE-CAPILLARY: 76 mg/dL (ref 65–99)
GLUCOSE-CAPILLARY: 81 mg/dL (ref 65–99)
Glucose-Capillary: 60 mg/dL — ABNORMAL LOW (ref 65–99)
Glucose-Capillary: 69 mg/dL (ref 65–99)
Glucose-Capillary: 75 mg/dL (ref 65–99)
Glucose-Capillary: 77 mg/dL (ref 65–99)

## 2015-06-16 LAB — MAGNESIUM
MAGNESIUM: 1.8 mg/dL (ref 1.7–2.4)
Magnesium: 1.7 mg/dL (ref 1.7–2.4)
Magnesium: 1.7 mg/dL (ref 1.7–2.4)
Magnesium: 1.7 mg/dL (ref 1.7–2.4)
Magnesium: 1.7 mg/dL (ref 1.7–2.4)

## 2015-06-16 LAB — TRIGLYCERIDES: Triglycerides: 108 mg/dL (ref <150)

## 2015-06-16 MED ORDER — DEXTROSE 50 % IV SOLN
INTRAVENOUS | Status: AC
  Administered 2015-06-16: 21:00:00
  Filled 2015-06-16: qty 50

## 2015-06-16 MED ORDER — DEXTROSE 50 % IV SOLN
INTRAVENOUS | Status: AC
  Administered 2015-06-16: 25 mL via INTRAVENOUS
  Filled 2015-06-16: qty 50

## 2015-06-16 MED ORDER — HEPARIN SODIUM (PORCINE) 5000 UNIT/ML IJ SOLN: 5000.0000 [IU] | Freq: Three times a day (TID) | INTRAMUSCULAR | Status: DC

## 2015-06-16 MED ORDER — CIPROFLOXACIN IN D5W 400 MG/200ML IV SOLN
400.0000 mg | Freq: Two times a day (BID) | INTRAVENOUS | Status: DC
  Administered 2015-06-16 – 2015-06-21 (×12): 400 mg via INTRAVENOUS
  Filled 2015-06-16 (×15): qty 200

## 2015-06-16 MED ORDER — DEXTROSE 50 % IV SOLN
25.0000 mL | INTRAVENOUS | Status: AC
  Administered 2015-06-16: 25 mL via INTRAVENOUS

## 2015-06-16 MED ORDER — DEXTROSE-NACL 5-0.45 % IV SOLN
INTRAVENOUS | Status: DC
  Administered 2015-06-16: 75 mL/h via INTRAVENOUS
  Administered 2015-06-17 (×2): via INTRAVENOUS

## 2015-06-16 MED ORDER — AMIODARONE HCL 200 MG PO TABS
400.0000 mg | ORAL_TABLET | Freq: Two times a day (BID) | ORAL | Status: DC
  Administered 2015-06-16 – 2015-06-20 (×9): 400 mg via ORAL
  Filled 2015-06-16 (×9): qty 2

## 2015-06-16 MED ORDER — DEXTROSE 50 % IV SOLN
25.0000 mL | Freq: Once | INTRAVENOUS | Status: AC
  Administered 2015-06-16: 25 mL via INTRAVENOUS

## 2015-06-16 MED ORDER — DEXTROSE 50 % IV SOLN
1.0000 | Freq: Once | INTRAVENOUS | Status: AC
  Administered 2015-06-16: 50 mL via INTRAVENOUS

## 2015-06-16 NOTE — Progress Notes (Signed)
[facility] NOTE  Pharmacy Consult for CRRT medication adjustment.   Indication: ICU Status   Allergies  Allergen Reactions  . Penicillins Nausea And Vomiting    Patient Measurements: Height: 5\' 8"  (172.7 cm) Weight: 282 lb 10.1 oz (128.2 kg) IBW/kg (Calculated) : 68.4   Vital Signs: Temp: 97.3 F (36.3 C) (08/20 0700) BP: 95/57 mmHg (08/20 0700) Pulse Rate: 86 (08/20 0700) Intake/Output from previous day: 08/19 0701 - 08/20 0700 In: 3232 [I.V.:1052; NG/GT:1530; IV Piggyback:650] Out: 2546 [Urine:45; Stool:100] Intake/Output from this shift:   Vent settings for last 24 hours: Vent Mode:  [-] PRVC FiO2 (%):  [35 %] 35 % Set Rate:  [24 bmp] 24 bmp Vt Set:  [500 mL] 500 mL PEEP:  [5 cmH20] 5 cmH20  Labs:  Recent Labs  06/14/15 0440  06/15/15 1600 06/15/15 1609 06/15/15 1950 06/16/15 0006  WBC 5.9  --   --  4.2  --  5.4  HGB 10.7*  --   --  10.6*  --  11.0*  HCT 32.9*  --   --  34.5*  --  34.3*  PLT 57*  --   --  46*  --  36*  CREATININE 2.47*  < > 1.61*  --  1.61* 1.56*  MG 1.9  < >  --  1.7 1.7 1.7  PHOS 2.9  < > 2.6  --  2.8 2.8  ALBUMIN 2.3*  < > 2.3*  --  2.2* 2.3*  < > = values in this interval not displayed. Estimated Creatinine Clearance: 64.1 mL/min (by C-G formula based on Cr of 1.56).   Recent Labs  06/15/15 2358 06/16/15 0331 06/16/15 0728  GLUCAP 77 69 81    Microbiology: Recent Results (from the past 720 hour(s))  Blood Culture (routine x 2)     Status: None   Collection Time: 06/10/15  2:35 PM  Result Value Ref Range Status   Specimen Description BLOOD RIGHT WRIST  Final   Special Requests BOTTLES DRAWN AEROBIC AND ANAEROBIC  1CC  Final   Culture NO GROWTH 5 DAYS  Final   Report Status 06/15/2015 FINAL  Final  Blood Culture (routine x 2)     Status: None   Collection Time: 06/10/15  2:40 PM  Result Value Ref Range Status   Specimen Description BLOOD LEFT ASSIST CONTROL  Final   Special Requests   Final   BOTTLES DRAWN AEROBIC AND ANAEROBIC  AER 6CC ANA 4CC   Culture NO GROWTH 5 DAYS  Final   Report Status 06/15/2015 FINAL  Final  Urine culture     Status: None   Collection Time: 06/10/15  4:42 PM  Result Value Ref Range Status   Specimen Description URINE, RANDOM  Final   Special Requests NONE  Final   Culture 4,000 COLONIES/mL INSIGNIFICANT GROWTH  Final   Report Status 06/12/2015 FINAL  Final  MRSA PCR Screening     Status: None   Collection Time: 06/10/15  6:27 PM  Result Value Ref Range Status   MRSA by PCR NEGATIVE NEGATIVE Final    Comment:        The GeneXpert MRSA Assay (FDA approved for NASAL specimens only), is one component of a comprehensive MRSA colonization surveillance program. It is not intended to diagnose MRSA infection nor to guide or monitor treatment for MRSA infections.   Culture, sputum-assessment     Status: None   Collection Time: 06/11/15  2:50 PM  Result Value Ref  Range Status   Specimen Description SPUTUM  Final   Special Requests NONE  Final   Sputum evaluation THIS SPECIMEN IS ACCEPTABLE FOR SPUTUM CULTURE  Final   Report Status 06/13/2015 FINAL  Final  Culture, respiratory (NON-Expectorated)     Status: None   Collection Time: 06/11/15  2:50 PM  Result Value Ref Range Status   Specimen Description SPUTUM  Final   Special Requests NONE Reflexed from [id number]  Final   Gram Stain   Final    MODERATE WBC SEEN RARE GRAM POSITIVE COCCI GOOD SPECIMEN - 80-90% WBCS    Culture MODERATE GROWTH STAPHYLOCOCCUS AUREUS  Final   Report Status 06/14/2015 FINAL  Final   Organism ID, Bacteria STAPHYLOCOCCUS AUREUS  Final      Susceptibility   Staphylococcus aureus - MIC*    CIPROFLOXACIN <=0.5 SENSITIVE Sensitive     GENTAMICIN <=0.5 SENSITIVE Sensitive     OXACILLIN 0.5 SENSITIVE Sensitive     TRIMETH/SULFA <=10 SENSITIVE Sensitive     CEFOXITIN SCREEN NEGATIVE Sensitive     Inducible Clindamycin NEGATIVE Sensitive     TETRACYCLINE Value in next row  Sensitive      SENSITIVE<=1    * MODERATE GROWTH STAPHYLOCOCCUS AUREUS  Wound culture     Status: None (Preliminary result)   Collection Time: 06/13/15  1:56 PM  Result Value Ref Range Status   Specimen Description LEG  Final   Special Requests Normal  Final   Gram Stain   Final    FEW WBC SEEN MODERATE GRAM NEGATIVE RODS FEW GRAM POSITIVE COCCI    Culture   Final    LIGHT GROWTH STAPHYLOCOCCUS AUREUS HOLDING FOR POSSIBLE ADDITIONAL PATHOGENS    Report Status PENDING  Incomplete   Organism ID, Bacteria STAPHYLOCOCCUS AUREUS  Final      Susceptibility   Staphylococcus aureus - MIC*    CIPROFLOXACIN <=0.5 SENSITIVE Sensitive     GENTAMICIN <=0.5 SENSITIVE Sensitive     OXACILLIN 0.5 SENSITIVE Sensitive     TRIMETH/SULFA <=10 SENSITIVE Sensitive     CEFOXITIN SCREEN NEGATIVE Sensitive     Inducible Clindamycin NEGATIVE Sensitive     TETRACYCLINE Value in next row Sensitive      SENSITIVE<=1    * LIGHT GROWTH STAPHYLOCOCCUS AUREUS  Wound culture     Status: None (Preliminary result)   Collection Time: 06/13/15  1:58 PM  Result Value Ref Range Status   Specimen Description LEG  Final   Special Requests Normal  Final   Gram Stain RARE WBC SEEN FEW GRAM NEGATIVE RODS   Final   Culture NO GROWTH 2 DAYS  Final   Report Status PENDING  Incomplete    Medications:  Scheduled:  . ampicillin-sulbactam (UNASYN) IV  3 g Intravenous Q6H  . antiseptic oral rinse  7 mL Mouth Rinse QID  . budesonide (PULMICORT) nebulizer solution  0.5 mg Nebulization BID  . chlorhexidine gluconate  15 mL Mouth Rinse BID  . clonazePAM  0.5 mg Oral BID  . famotidine  20 mg Oral Q24H  . feeding supplement (PRO-STAT SUGAR FREE 64)  30 mL Oral BID  . free water  25 mL Per Tube 6 times per day  . ipratropium-albuterol  3 mL Nebulization Q6H   Infusions:  . amiodarone 30 mg/hr (06/15/15 2210)  . feeding supplement (VITAL HIGH PROTEIN)    . fentaNYL infusion INTRAVENOUS 300 mcg/hr (06/16/15 0132)  .  norepinephrine    . pureflow 2,500 mL/hr at 06/15/15 2054  Assessment: 64 yo male requiring mechanical ventilation and CRRT.    Plan:  No further adjustments warranted at this time.    Pharmacy will continue to monitor and adjust per consult.   Shaquaya Wuellner L 06/16/2015,8:03 AM

## 2015-06-16 NOTE — Progress Notes (Signed)
    Subjective:  Intubated, unable to obtain any history.  Objective:  Vital Signs in the last 24 hours: Temp:  [96.6 F (35.9 C)-97.7 F (36.5 C)] 97.5 F (36.4 C) (08/20 1000) Pulse Rate:  [25-109] 69 (08/20 1000) Resp:  [0-24] 24 (08/20 1000) BP: (79-109)/(48-82) 84/59 mmHg (08/20 1000) SpO2:  [89 %-100 %] 100 % (08/20 1000) FiO2 (%):  [35 %] 35 % (08/20 0804) Weight:  [282 lb 10.1 oz (128.2 kg)] 282 lb 10.1 oz (128.2 kg) (08/20 0500)  Intake/Output from previous day: 08/19 0701 - 08/20 0700 In: 3232 [I.V.:1052; NG/GT:1530; IV Piggyback:650] Out: 2546 [Urine:45; Stool:100]  Physical Exam: Pt is intubated, opens eyes to command HEENT: normal Neck: JVP - moderately elevated Lungs: coarse bilaterally CV: irregularly irregular Abd: soft, + BS Ext: diffuse edema/anasarca Skin: warm/dry no rash   Lab Results:  Recent Labs  06/15/15 1609 06/16/15 0006  WBC 4.2 5.4  HGB 10.6* 11.0*  PLT 46* 36*    Recent Labs  06/16/15 0410 06/16/15 0930  NA 142 141  K 3.7 3.8  CL 108 107  CO2 26 29  GLUCOSE 99 106*  BUN 23* 22*  CREATININE 1.48* 1.37*   No results for input(s): TROPONINI in the last 72 hours.  Invalid input(s): CK, MB  Cardiac Studies: 2D Echo: Study Conclusions  - Left ventricle: The cavity size was normal. There was moderate concentric hypertrophy. Systolic function was moderately to severely reduced. The estimated ejection fraction was in the range of 30% to 35%. Diffuse hypokinesis. - Left atrium: The atrium was moderately dilated. - Right ventricle: The cavity size was mildly dilated. Wall thickness was normal. Systolic function was mildly reduced. - Right atrium: The atrium was moderately to severely dilated. - Pulmonary arteries: Systolic pressure was within the normal range. PA peak pressure: 32 mm Hg (S). - Inferior vena cava: The vessel was dilated. The respirophasic diameter changes were blunted (< 50%). - Pericardium,  extracardiac: A small pericardial effusion was identified posterior to the heart and along the left ventricular free wall.  Tele: Atrial fibrillation heart rate 80's  Assessment/Plan:  1. Chronic atrial fibrillation: initially with RVR now controlled on amiodarone drip since 8/15 2. Acute on chronic systolic heart failure, LVEF 35%, in setting multiorgan failure 3. Acute renal failure 4. Hospital acquired pneumonia  Notes reviewed, including note from Palliative Care Team. Pt with poor prognosis. IV heparin stopped and will write for DVT prophylaxis dosing. Will stop IV amiodarone and change to amiodarone 400 mg BID per tube. If HR elevates again will need to restart infusion. Will follow from distance pending decisions regarding goals of care. Gaynell Face, M.D. 06/16/2015, 11:18 AM

## 2015-06-16 NOTE — Progress Notes (Signed)
Blood sugar was 60 and 17ml of dextrose was given per side bar protocol at 1716. At 1723 blood glucose from lab blood draw from renal panel was 146. At this time 1825 blood glucose is 75 from fingerstick result. Tube feeds now started back after ultrasound of abdomen.

## 2015-06-16 NOTE — Progress Notes (Signed)
Per hypoglycemic protocol spoke with Dr. Doy Hutching about pt's blood sugar of 65.  He ordered a half amp of D50 only.

## 2015-06-16 NOTE — Progress Notes (Signed)
Per hypoglycemic protocol spoke with Dr. Jannifer Franklin about pt's blood sugar of 54. He ordered an amp of D50.

## 2015-06-16 NOTE — Progress Notes (Signed)
Spoke with Dr. Jannifer Franklin on the phone and informed him of the pt's residual of 70ml at 2000 and had increased to 22ml at 2200, along with his BG dropping to 65 and requiring an amp and a half of D50 to bring it up to 75. He ordered to hold TF tonight and to start D5.45 at 74ml/h.

## 2015-06-16 NOTE — Progress Notes (Signed)
[facility]  ROUNDING NOTE   Subjective:  Pt remains critically ill. Awake this AM, moving b/l UE's. Continues on CRRT. Good UF noted.    Objective:  Vital signs in last 24 hours:  Temp:  [96.6 F (35.9 C)-97.7 F (36.5 C)] 97.3 F (36.3 C) (08/20 0700) Pulse Rate:  [25-109] 86 (08/20 0700) Resp:  [0-24] 24 (08/20 0700) BP: (79-121)/(48-82) 95/57 mmHg (08/20 0700) SpO2:  [88 %-100 %] 92 % (08/20 0700) FiO2 (%):  [35 %] 35 % (08/20 0436) Weight:  [128.2 kg (282 lb 10.1 oz)] 128.2 kg (282 lb 10.1 oz) (08/20 0500)  Weight change: 3.6 kg (7 lb 15 oz) Filed Weights   06/14/15 0500 06/15/15 0440 06/16/15 0500  Weight: 126.3 kg (278 lb 7.1 oz) 124.6 kg (274 lb 11.1 oz) 128.2 kg (282 lb 10.1 oz)    Intake/Output: I/O last 3 completed shifts: In: 5282.4 [I.V.:1828.4; NG/GT:2404; IV GBTDVVOHY:0737] Out: 1062 [Urine:95; IRSWN:4627; Stool:500]   Intake/Output this shift:     Physical Exam: General: Critically ill appearing  Head: Normocephalic, atraumatic. ETT in place  Eyes: Eyes closed  Neck: Supple, trachea midline  Lungs:  Bilateral rhonchi vent assisted  Heart: Irregular S1S2 no rubs  Abdomen:  Soft, nontender, mild distension  Extremities:  2+ peripheral edema  Neurologic: Intubated, moving b/l UE's  Skin: ulcertaion bilateral lower extremeties  Access: Femoral dialysis catheter    Basic Metabolic Panel:  Recent Labs Lab 06/15/15 0802 06/15/15 1201 06/15/15 1600 06/15/15 1609 06/15/15 1950 06/16/15 0006  NA 141 141 141  --  141 140  K 3.9 3.5 3.5  --  3.6 3.6  CL 107 109 109  --  108 107  CO2 28 26 25   --  28 27  GLUCOSE 113* 122* 122*  --  104* 100*  BUN 27* 27* 25*  --  25* 23*  CREATININE 1.70* 1.71* 1.61*  --  1.61* 1.56*  CALCIUM 8.1* 7.7* 7.9*  --  7.9* 7.9*  MG 1.8 1.7  --  1.7 1.7 1.7  PHOS 2.2* 3.1 2.6  --  2.8 2.8    Liver Function Tests:  Recent Labs Lab 06/10/15 1435 06/11/15 1519  06/15/15 0802 06/15/15 1201  06/15/15 1600 06/15/15 1950 06/16/15 0006  AST 301* 108*  --   --   --   --   --   --   ALT 150* 112*  --   --   --   --   --   --   ALKPHOS 87 68  --   --   --   --   --   --   BILITOT 6.7* 5.4*  --   --   --   --   --   --   PROT 7.2 6.0*  --   --   --   --   --   --   ALBUMIN 3.5 2.8*  < > 2.3* 2.3* 2.3* 2.2* 2.3*  < > = values in this interval not displayed.  Recent Labs Lab 06/10/15 1435  LIPASE 20*    Recent Labs Lab 06/10/15 1435  AMMONIA 26    CBC:  Recent Labs Lab 06/10/15 1435 06/12/15 0300 06/13/15 0143 06/14/15 0440 06/15/15 1609 06/16/15 0006  WBC 11.1* 10.6 8.4 5.9 4.2 5.4  NEUTROABS 9.8*  --  7.7*  --   --   --   HGB 13.4 12.9* 12.2* 10.7* 10.6* 11.0*  HCT 42.0 40.0 38.5* 32.9* 34.5* 34.3*  MCV 105.2* 105.9* 107.8* 103.3* 106.7* 104.1*  PLT 155 98* 85* 57* 46* 36*    Cardiac Enzymes:  Recent Labs Lab 06/09/15 2025 06/10/15 1435 06/11/15 0040 06/11/15 0312  TROPONINI 0.26* 0.20* 0.22* 0.27*    BNP: Invalid input(s): POCBNP  CBG:  Recent Labs Lab 06/15/15 1609 06/15/15 1930 06/15/15 2358 06/16/15 0331 06/16/15 0728  GLUCAP 101* 108* 77 82 81    Microbiology: Results for orders placed or performed during the hospital encounter of 06/10/15  Blood Culture (routine x 2)     Status: None   Collection Time: 06/10/15  2:35 PM  Result Value Ref Range Status   Specimen Description BLOOD RIGHT WRIST  Final   Special Requests BOTTLES DRAWN AEROBIC AND ANAEROBIC  [place]  Final   Culture NO GROWTH 5 DAYS  Final   Report Status 06/15/2015 FINAL  Final  Blood Culture (routine x 2)     Status: None   Collection Time: 06/10/15  2:40 PM  Result Value Ref Range Status   Specimen Description BLOOD LEFT ASSIST CONTROL  Final   Special Requests   Final    BOTTLES DRAWN AEROBIC AND ANAEROBIC  AER 6CC ANA 4CC   Culture NO GROWTH 5 DAYS  Final   Report Status 06/15/2015 FINAL  Final  Urine culture     Status: None   Collection Time: 06/10/15   4:42 PM  Result Value Ref Range Status   Specimen Description URINE, RANDOM  Final   Special Requests NONE  Final   Culture 4,000 COLONIES/mL INSIGNIFICANT GROWTH  Final   Report Status 06/12/2015 FINAL  Final  MRSA PCR Screening     Status: None   Collection Time: 06/10/15  6:27 PM  Result Value Ref Range Status   MRSA by PCR NEGATIVE NEGATIVE Final    Comment:        The GeneXpert MRSA Assay (FDA approved for NASAL specimens only), is one component of a comprehensive MRSA colonization surveillance program. It is not intended to diagnose MRSA infection nor to guide or monitor treatment for MRSA infections.   Culture, sputum-assessment     Status: None   Collection Time: 06/11/15  2:50 PM  Result Value Ref Range Status   Specimen Description SPUTUM  Final   Special Requests NONE  Final   Sputum evaluation THIS SPECIMEN IS ACCEPTABLE FOR SPUTUM CULTURE  Final   Report Status 06/13/2015 FINAL  Final  Culture, respiratory (NON-Expectorated)     Status: None   Collection Time: 06/11/15  2:50 PM  Result Value Ref Range Status   Specimen Description SPUTUM  Final   Special Requests NONE Reflexed from [id number]  Final   Gram Stain   Final    MODERATE WBC SEEN RARE GRAM POSITIVE COCCI GOOD SPECIMEN - 80-90% WBCS    Culture MODERATE GROWTH STAPHYLOCOCCUS AUREUS  Final   Report Status 06/14/2015 FINAL  Final   Organism ID, Bacteria STAPHYLOCOCCUS AUREUS  Final      Susceptibility   Staphylococcus aureus - MIC*    CIPROFLOXACIN <=0.5 SENSITIVE Sensitive     GENTAMICIN <=0.5 SENSITIVE Sensitive     OXACILLIN 0.5 SENSITIVE Sensitive     TRIMETH/SULFA <=10 SENSITIVE Sensitive     CEFOXITIN SCREEN NEGATIVE Sensitive     Inducible Clindamycin NEGATIVE Sensitive     TETRACYCLINE Value in next row Sensitive      SENSITIVE<=1    * MODERATE GROWTH STAPHYLOCOCCUS AUREUS  Wound culture     Status: None (Preliminary  result)   Collection Time: 06/13/15  1:56 PM  Result Value Ref Range  Status   Specimen Description LEG  Final   Special Requests Normal  Final   Gram Stain   Final    FEW WBC SEEN MODERATE GRAM NEGATIVE RODS FEW GRAM POSITIVE COCCI    Culture   Final    LIGHT GROWTH STAPHYLOCOCCUS AUREUS HOLDING FOR POSSIBLE ADDITIONAL PATHOGENS    Report Status PENDING  Incomplete   Organism ID, Bacteria STAPHYLOCOCCUS AUREUS  Final      Susceptibility   Staphylococcus aureus - MIC*    CIPROFLOXACIN <=0.5 SENSITIVE Sensitive     GENTAMICIN <=0.5 SENSITIVE Sensitive     OXACILLIN 0.5 SENSITIVE Sensitive     TRIMETH/SULFA <=10 SENSITIVE Sensitive     CEFOXITIN SCREEN NEGATIVE Sensitive     Inducible Clindamycin NEGATIVE Sensitive     TETRACYCLINE Value in next row Sensitive      SENSITIVE<=1    * LIGHT GROWTH STAPHYLOCOCCUS AUREUS  Wound culture     Status: None (Preliminary result)   Collection Time: 06/13/15  1:58 PM  Result Value Ref Range Status   Specimen Description LEG  Final   Special Requests Normal  Final   Gram Stain RARE WBC SEEN FEW GRAM NEGATIVE RODS   Final   Culture NO GROWTH 2 DAYS  Final   Report Status PENDING  Incomplete    Coagulation Studies: No results for input(s): LABPROT, INR in the last 72 hours.  Urinalysis: No results for input(s): COLORURINE, LABSPEC, PHURINE, GLUCOSEU, HGBUR, BILIRUBINUR, KETONESUR, PROTEINUR, UROBILINOGEN, NITRITE, LEUKOCYTESUR in the last 72 hours.  Invalid input(s): APPERANCEUR    Imaging: Dg Chest Port 1 View  06/15/2015   CLINICAL DATA:  Intubated patient, acute respiratory failure, community-acquired pneumonia, CHF.  EXAM: PORTABLE CHEST - 1 VIEW  COMPARISON:  Portable chest x-ray of June 13, 2015  FINDINGS: The lungs are mildly hypoinflated. The interstitial markings are increased bilaterally. The retrocardiac region remains dense. There is no pneumothorax or significant pleural effusion. The cardiac silhouette remains enlarged. The pulmonary vascularity is engorged. The endotracheal tube tip lies  3.2 cm above the carina. The esophagogastric tube tip projects below the inferior margin of the image. The left subclavian venous catheter tip projects over the midportion of the SVC.  IMPRESSION: Slight interval improvement in pulmonary interstitial edema despite mild hypo inflation. The support tubes are in reasonable position.   Electronically Signed   By: David  Martinique M.D.   On: 06/15/2015 08:45     Medications:   . amiodarone 30 mg/hr (06/15/15 2210)  . feeding supplement (VITAL HIGH PROTEIN)    . fentaNYL infusion INTRAVENOUS 300 mcg/hr (06/16/15 0132)  . norepinephrine    . pureflow 2,500 mL/hr at 06/15/15 2054   . ampicillin-sulbactam (UNASYN) IV  3 g Intravenous Q6H  . antiseptic oral rinse  7 mL Mouth Rinse QID  . budesonide (PULMICORT) nebulizer solution  0.5 mg Nebulization BID  . chlorhexidine gluconate  15 mL Mouth Rinse BID  . clonazePAM  0.5 mg Oral BID  . famotidine  20 mg Oral Q24H  . feeding supplement (PRO-STAT SUGAR FREE 64)  30 mL Oral BID  . free water  25 mL Per Tube 6 times per day  . ipratropium-albuterol  3 mL Nebulization Q6H   ammonium lactate, anticoagulant sodium citrate, midazolam, rocuronium  Assessment/ Plan:  64 y.o. male with a PMHx of congestive heart failure ejection fraction 30-35%, prior pneumonia, lower extremity edema with cellulitis,  atrial fibrillation who was admitted to [facility] on 06/10/2015 for evaluation of altered mental status and shortness of breath. Upon admission the patient underwent intubation with transition to a mechanical ventilation.  1. Acute renal failure/chronic kidney disease stage III with baseline creatinine 1.3/proteinuria. The patient most likely has acute renal failure secondary to altered cardiorenal hemodynamics. His ejection fraction is low at 30%. -Remains oliguric, good ultrafiltration performed yesterday, decreased edema today, continue CRRT with UF target of 125cc/hr.   2. Acute respiratory failure. Hopefully CRRT  and UF will continue to decrease pulmonary edema as well as peripheral edema.  Continue UF rate of 125cc/hr.  Will monitor.   3. Acute systolic heart failure. As above, should be improved with aggressive UF, continue CRRT.  LOS: 6 Aaron Harrison 8/20/20167:57 AM

## 2015-06-16 NOTE — Progress Notes (Signed)
[place] [facility] [place] NAME: Aaron Harrison    MR#:  [id number]  DATE OF BIRTH:  1951/10/19  SUBJECTIVE:  Remains intubated and on the vent. On IV Amiodarone,Heparin gtt,IV fentanyl. Sedated No fever  CRRT being tolerated TF+ flexiseal+ REVIEW OF SYSTEMS:   Review of Systems  Unable to perform ROS: intubated   Tolerating Diet:TF Tolerating PT: no,on vent  DRUG ALLERGIES:   Allergies  Allergen Reactions  . Penicillins Nausea And Vomiting    VITALS:  Blood pressure 84/59, pulse 69, temperature 97.5 F (36.4 C), temperature source Other (Comment), resp. rate 24, height 5\' 8"  (1.727 m), weight 128.2 kg (282 lb 10.1 oz), SpO2 100 %.  PHYSICAL EXAMINATION:   Physical Exam  GENERAL:  64 y.o.-year-old patient lying in the bed with no acute distress. critically ill anasarc EYES: Pupils equal, round, reactive to light and accommodation. No scleral icterus. Extraocular muscles intact.  HEENT: Head atraumatic, normocephalic. Oropharynx and nasopharynx clear.  -orally intubated NECK:  Supple, no jugular venous distention. No thyroid enlargement, no tenderness.  LUNGS: Normal breath sounds bilaterally, no wheezing, rales, rhonchi. No use of accessory muscles of respiration. Left upper chest CL+ CARDIOVASCULAR: S1, S2 normal. No murmurs, rubs, or gallops.  ABDOMEN: Soft, nontender, nondistended. Bowel sounds present. No organomegaly or mass.  -severe scrotal edema EXTREMITIES: bilateral ++++ edema b/l with weeping skin ulcers on tibial shin NEUROLOGIC: sedated and on the vent PSYCHIATRIC: on the vent SKIN:bilateral LE cellulitis and weeping skin ulcers over the tibial shin  LABORATORY PANEL:   CBC  Recent Labs Lab 06/16/15 0006  WBC 5.4  HGB 11.0*  HCT 34.3*  PLT 36*    Chemistries   Recent Labs Lab 06/11/15 1519  06/16/15 0930  NA 138  < > 141  K 4.8  < > 3.8  CL 106  < > 107  CO2 24  < > 29  GLUCOSE 109*  < >  106*  BUN 55*  < > 22*  CREATININE 2.64*  < > 1.37*  CALCIUM 7.9*  < > 8.3*  MG  --   < > 1.7  AST 108*  --   --   ALT 112*  --   --   ALKPHOS 68  --   --   BILITOT 5.4*  --   --   < > = values in this interval not displayed.  Cardiac Enzymes  Recent Labs Lab 06/11/15 0312  TROPONINI 0.27*    RADIOLOGY:  Dg Chest Port 1 View  06/15/2015   CLINICAL DATA:  Intubated patient, acute respiratory failure, community-acquired pneumonia, CHF.  EXAM: PORTABLE CHEST - 1 VIEW  COMPARISON:  Portable chest x-ray of June 13, 2015  FINDINGS: The lungs are mildly hypoinflated. The interstitial markings are increased bilaterally. The retrocardiac region remains dense. There is no pneumothorax or significant pleural effusion. The cardiac silhouette remains enlarged. The pulmonary vascularity is engorged. The endotracheal tube tip lies 3.2 cm above the carina. The esophagogastric tube tip projects below the inferior margin of the image. The left subclavian venous catheter tip projects over the midportion of the SVC.  IMPRESSION: Slight interval improvement in pulmonary interstitial edema despite mild hypo inflation. The support tubes are in reasonable position.   Electronically Signed   By: David  Martinique M.D.   On: 06/15/2015 08:45     ASSESSMENT AND PLAN:  64 y.o. male with a known history of A. fib, CHF came in with  *  Acute respiratory failure with hypoxia: intubated in ED, extubated on August 15 - Reintubated on August 17 due to increased work of breathing. - On heparin, amiodarone and fentanyl drip.  * Sepsis with multiorgan failure: Likely hospital-acquired pneumonia  Infectious disease input appreciated  -BC negative -wbc stable -no fever  * MSSA Pneumonia- was on  IV Unasyn  * Acute renal failure: Possible cardiorenal syndrome due to underlying CHF. Appreciate nephrology input - temporary dialysis catheter placed in the right groin and CRRT started 8/17  * Acute on chronic  systolic CHF, ejection fraction 35%:  Unable to continue diuresis at this time due to hypotension  Echo showing ejection fraction 30% to 35% -CRRT with some UF -positive by 31K L, severe anasarca  * A. fib with RVR:  Not able to use rate controlling medication due to hypotension. On IV amiodaronegtt -on heparin gtt(not sure who placed the order). No mention of need for heparin gttt per cardiology notes -will d/c gtt -In the past pt had refused anticoagulation -Continue aspirin for now  * Elevated troponin: Due to supply demand ischemia  * Bilateral lower extremity cellulitis: change to IV cipro given proteus in the wound * Hyperkalemia: Resolved  * Ascites/Abnormal liver function test: GI following, on lactulose  *palliative care consult noted. Pt has a very poor prognosis given multiorgan failure.no family. Has 2 friends. Note from palliatve care noted.  Case discussed with Care Management/Social Worker. Management plans discussed with the patient, family and they are in agreement.  CODE STATUS: DNR DVT Prophylaxis: sq heparin  TOTAL Critical TIME TAKING CARE OF THIS PATIENT: 50minutes.  >50% time spent on counselling and coordination of care    Aaron Harrison M.D on 06/16/2015 at 11:06 AM  Between 7am to 6pm - Pager - [phone]  After 6pm go to [email or web address] - password [email or web address] [facility]  [facility]  Office  [phone]  CC: Primary care physician; No PCP Per Patient

## 2015-06-16 NOTE — Progress Notes (Signed)
[facility] Progess Note     ASSESSMENAT/PLAN   64 yo white male admitted to ICU for acute  hypercapnic and hypoxic resp failure from acute CHF exacerbation with acute liver dysfunction/liver failure with acute renal failure with acute encephalopathy with mixed resp and metabolic acidosis with recurrent resp failure. Pt grew MSSA in sputum, suspect pneumonia.  Pt failed extubation and reintubated.   PULMONARY 1.Respiratory Failure-patient reintubated a second time due to worsening cardio-renal syndrome -continue Full MV support, currently on vent with rate of 24.  -continue Bronchodilator Therapy -Wean Fio2 and PEEP as tolerated  CARDIOVASCULAR ACUTE CHF -intubated -continue vent support  RENAL Renal Failure-most likely due to ATN -follow chem 7 -follow UO -continue Foley Catheter-assess need -vasc cath placed for CRRT-nephrology consulted   GASTROINTESTINAL GI Prophylaxic -OG placed-on TF's  HEMATOLOGIC Follow h/h  INFECTIOUS ?cellulitis of LE -on unasyn LE cultures pending.  Continue unasyn for MSSA pneumonia, ID following.   ENDOCRINE - ICU hypoglycemic\Hyperglycemia protocol   NEUROLOGIC - intubated and sedated - minimal sedation to achieve a RASS goal: -1     -DVT prophylaxis, on  -Gi Prophylaxis, on tube feeds, Famotidine.  ----------------------------------------   Name: LYNDON CHAPEL MRN: [id number] DOB: 1951-02-09    ADMISSION DATE:  06/10/2015  SUBJECTIVE:   Pt currently on the ventilator, can not provide history or review of systems.   Review of Systems:  Pt currently on the ventilator, can not provide history or review of systems.    VITAL SIGNS: Temp:  [96.6 F (35.9 C)-97.7 F (36.5 C)] 97.5 F (36.4 C) (08/20 0800) Pulse Rate:  [25-109] 89 (08/20 0800) Resp:  [0-24] 24 (08/20 0800) BP: (79-109)/(48-82) 100/64 mmHg (08/20 0800) SpO2:  [89 %-100 %] 94 % (08/20 0800) FiO2 (%):  [35 %] 35 % (08/20  0436) Weight:  [128.2 kg (282 lb 10.1 oz)] 128.2 kg (282 lb 10.1 oz) (08/20 0500) HEMODYNAMICS:   VENTILATOR SETTINGS: Vent Mode:  [-] PRVC FiO2 (%):  [35 %] 35 % Set Rate:  [24 bmp] 24 bmp Vt Set:  [500 mL] 500 mL PEEP:  [5 cmH20] 5 cmH20 INTAKE / OUTPUT:  Intake/Output Summary (Last 24 hours) at 06/16/15 0923 Last data filed at 06/16/15 0800  Gross per 24 hour  Intake 2830.28 ml  Output   2428 ml  Net 402.28 ml    PHYSICAL EXAMINATION: Physical Examination:   VS: BP 100/64 mmHg  Pulse 89  Temp(Src) 97.5 F (36.4 C) (Other (Comment))  Resp 24  Ht 5\' 8"  (1.727 m)  Wt 128.2 kg (282 lb 10.1 oz)  BMI 42.98 kg/m2  SpO2 94%  General Appearance: No distress  Neuro:without focal findings, mental status normal. HEENT: PERRLA, EOM intact. Pulmonary: normal breath sounds   CardiovascularNormal S1,S2.  No m/r/g.   Abdomen: Benign, Soft, non-tender. Renal:  No costovertebral tenderness  GU:  Not performed at this time. Endocrine: No evident thyromegaly. Skin:   warm, no rashes, no ecchymosis  Extremities: normal, no cyanosis, clubbing.   LABS:  CBC  Recent Labs Lab 06/14/15 0440 06/15/15 1609 06/16/15 0006  WBC 5.9 4.2 5.4  HGB 10.7* 10.6* 11.0*  HCT 32.9* 34.5* 34.3*  PLT 57* 46* 36*   Coag's  Recent Labs Lab 06/10/15 1435  APTT 32  INR 2.00   BMET  Recent Labs Lab 06/15/15 1950 06/16/15 0006 06/16/15 0410  NA 141 140 142  K 3.6 3.6 3.7  CL 108 107 108  CO2 28 27 26  BUN 25* 23* 23*  CREATININE 1.61* 1.56* 1.48*  GLUCOSE 104* 100* 99   Electrolytes  Recent Labs Lab 06/15/15 1609 06/15/15 1950 06/16/15 0006 06/16/15 0410  CALCIUM  --  7.9* 7.9* 8.1*  MG 1.7 1.7 1.7  --   PHOS  --  2.8 2.8 2.4*   Sepsis Markers  Recent Labs Lab 06/10/15 1435 06/10/15 1837  LATICACIDVEN 2.1* 4.0*   ABG  Recent Labs Lab 06/10/15 1930 06/13/15 0955 06/14/15 0915  PHART 7.29* 7.22* 7.37  PCO2ART 45 55* 44  PO2ART 185* 117* 81*   Liver  Enzymes  Recent Labs Lab 06/10/15 1435 06/11/15 1519  06/15/15 1950 06/16/15 0006 06/16/15 0410  AST 301* 108*  --   --   --   --   ALT 150* 112*  --   --   --   --   ALKPHOS 87 68  --   --   --   --   BILITOT 6.7* 5.4*  --   --   --   --   ALBUMIN 3.5 2.8*  < > 2.2* 2.3* 2.3*  < > = values in this interval not displayed. Cardiac Enzymes  Recent Labs Lab 06/10/15 1435 06/11/15 0040 06/11/15 0312  TROPONINI 0.20* 0.22* 0.27*   Glucose  Recent Labs Lab 06/15/15 1150 06/15/15 1609 06/15/15 1930 06/15/15 2358 06/16/15 0331 06/16/15 0728  GLUCAP 108* 101* 108* 77 69 81    Imaging No results found.   Marda Stalker, MD.  Pager [phone] [facility] Office Number: [phone]  Patricia Pesa, M.D.  Vilinda Boehringer, M.D.  Cheral Marker, M.D  [place].  I have personally obtained a history, examined the patient, evaluated laboratory and imaging results, formulated the assessment and plan and placed orders. The Patient requires high complexity decision making for assessment and support, frequent evaluation and titration of therapies, application of advanced monitoring technologies and extensive interpretation of multiple databases. The patient has critical illness that could lead imminently to failure of 1 or more organ systems and requires the highest level of physician preparedness to intervene.  Critical Care Time devoted to patient care services described in this note is 35 minutes and is exclusive of time spent in procedures.

## 2015-06-16 NOTE — Progress Notes (Addendum)
Nutrition Follow-up   INTERVENTION:   Coordination of Care: spoke with RN, Brittney this am who reports TF currently on hold for ultrasound this afternoon. Plan to restart TF after procedure.   NUTRITION DIAGNOSIS:   Inadequate oral intake related to acute illness as evidenced by NPO status, TF on hold for procedure.  GOAL:   Patient will meet greater than or equal to 90% of their needs; ongoing  MONITOR:    (Energy Intake, EN, Digestive System, Electrolyte/Renal Profile, Glucose Profile)  REASON FOR ASSESSMENT:   Consult Enteral/tube feeding initiation and management  ASSESSMENT:    Pt remains on the vent, scheduled for ultrasound this afternoon. Per nephrology pt to continue on CRRT with good UF currently.  Diet Order:  Diet NPO time specified    Current Nutrition: TF on hold, pt was tolerating Vital High Protein at 73mL/hr with Prostat BID and free water flushes of 69mL q 4 hours.   Gastrointestinal Profile: abdomen remains distended Last BM: per documentation only 159mL out flexiseal late yesterday. On visit, RD observed approximately 527mL output in bag.   Medications: Fentanyl, Cipro, Sodium phosphate given yesterday  Electrolyte/Renal Profile and Glucose Profile:   Recent Labs Lab 06/15/15 1950 06/16/15 0006 06/16/15 0410 06/16/15 0930  NA 141 140 142 141  K 3.6 3.6 3.7 3.8  CL 108 107 108 107  CO2 28 27 26 29   BUN 25* 23* 23* 22*  CREATININE 1.61* 1.56* 1.48* 1.37*  CALCIUM 7.9* 7.9* 8.1* 8.3*  MG 1.7 1.7  --  1.7  PHOS 2.8 2.8 2.4* 2.4*  GLUCOSE 104* 100* 99 106*   Protein Profile:  Recent Labs Lab 06/16/15 0006 06/16/15 0410 06/16/15 0930  ALBUMIN 2.3* 2.3* 2.4*     Weight Trend since Admission: Filed Weights   06/14/15 0500 06/15/15 0440 06/16/15 0500  Weight: 278 lb 7.1 oz (126.3 kg) 274 lb 11.1 oz (124.6 kg) 282 lb 10.1 oz (128.2 kg)     Skin:  Reviewed, no issues  BMI:  Body mass index is 42.98 kg/(m^2).  Estimated  Nutritional Needs:   Kcal:  9470-9628 kcals (11-14 kcals/kg)   Protein:  140-175 g (2.0-2.5 g/kg)   Fluid:  1750-2100 mL (25-30 ml/kg)    HIGH Care Level  Dwyane Luo, RD, LDN Pager [phone]

## 2015-06-17 ENCOUNTER — Inpatient Hospital Stay: Payer: Medicaid Other

## 2015-06-17 DIAGNOSIS — Z978 Presence of other specified devices: Secondary | ICD-10-CM | POA: Insufficient documentation

## 2015-06-17 DIAGNOSIS — Z789 Other specified health status: Secondary | ICD-10-CM

## 2015-06-17 DIAGNOSIS — K769 Liver disease, unspecified: Secondary | ICD-10-CM | POA: Insufficient documentation

## 2015-06-17 DIAGNOSIS — L03116 Cellulitis of left lower limb: Secondary | ICD-10-CM | POA: Insufficient documentation

## 2015-06-17 DIAGNOSIS — R140 Abdominal distension (gaseous): Secondary | ICD-10-CM | POA: Insufficient documentation

## 2015-06-17 DIAGNOSIS — J189 Pneumonia, unspecified organism: Secondary | ICD-10-CM | POA: Insufficient documentation

## 2015-06-17 LAB — CBC
HCT: 33.9 % — ABNORMAL LOW (ref 40.0–52.0)
HEMOGLOBIN: 10.8 g/dL — AB (ref 13.0–18.0)
MCH: 32.7 pg (ref 26.0–34.0)
MCHC: 31.7 g/dL — ABNORMAL LOW (ref 32.0–36.0)
MCV: 103 fL — AB (ref 80.0–100.0)
PLATELETS: 33 10*3/uL — AB (ref 150–440)
RBC: 3.29 MIL/uL — AB (ref 4.40–5.90)
RDW: 17.4 % — ABNORMAL HIGH (ref 11.5–14.5)
WBC: 5.3 10*3/uL (ref 3.8–10.6)

## 2015-06-17 LAB — GLUCOSE, CAPILLARY
GLUCOSE-CAPILLARY: 75 mg/dL (ref 65–99)
GLUCOSE-CAPILLARY: 80 mg/dL (ref 65–99)
GLUCOSE-CAPILLARY: 82 mg/dL (ref 65–99)
GLUCOSE-CAPILLARY: 83 mg/dL (ref 65–99)
GLUCOSE-CAPILLARY: 90 mg/dL (ref 65–99)
Glucose-Capillary: 74 mg/dL (ref 65–99)
Glucose-Capillary: 85 mg/dL (ref 65–99)

## 2015-06-17 LAB — WOUND CULTURE
Culture: NO GROWTH
SPECIAL REQUESTS: NORMAL
Special Requests: NORMAL

## 2015-06-17 LAB — RENAL FUNCTION PANEL
ALBUMIN: 2.1 g/dL — AB (ref 3.5–5.0)
ALBUMIN: 2.3 g/dL — AB (ref 3.5–5.0)
ALBUMIN: 2.4 g/dL — AB (ref 3.5–5.0)
ANION GAP: 4 — AB (ref 5–15)
ANION GAP: 5 (ref 5–15)
ANION GAP: 4 — AB (ref 5–15)
Albumin: 2.3 g/dL — ABNORMAL LOW (ref 3.5–5.0)
Albumin: 2.4 g/dL — ABNORMAL LOW (ref 3.5–5.0)
Anion gap: 4 — ABNORMAL LOW (ref 5–15)
Anion gap: 4 — ABNORMAL LOW (ref 5–15)
BUN: 15 mg/dL (ref 6–20)
BUN: 16 mg/dL (ref 6–20)
BUN: 17 mg/dL (ref 6–20)
BUN: 18 mg/dL (ref 6–20)
BUN: 18 mg/dL (ref 6–20)
CALCIUM: 8.2 mg/dL — AB (ref 8.9–10.3)
CHLORIDE: 106 mmol/L (ref 101–111)
CHLORIDE: 105 mmol/L (ref 101–111)
CHLORIDE: 106 mmol/L (ref 101–111)
CO2: 27 mmol/L (ref 22–32)
CO2: 27 mmol/L (ref 22–32)
CO2: 28 mmol/L (ref 22–32)
CO2: 28 mmol/L (ref 22–32)
CO2: 29 mmol/L (ref 22–32)
CREATININE: 1.27 mg/dL — AB (ref 0.61–1.24)
Calcium: 7.9 mg/dL — ABNORMAL LOW (ref 8.9–10.3)
Calcium: 8.2 mg/dL — ABNORMAL LOW (ref 8.9–10.3)
Calcium: 8.3 mg/dL — ABNORMAL LOW (ref 8.9–10.3)
Calcium: 8.3 mg/dL — ABNORMAL LOW (ref 8.9–10.3)
Chloride: 106 mmol/L (ref 101–111)
Chloride: 106 mmol/L (ref 101–111)
Creatinine, Ser: 1.25 mg/dL — ABNORMAL HIGH (ref 0.61–1.24)
Creatinine, Ser: 1.25 mg/dL — ABNORMAL HIGH (ref 0.61–1.24)
Creatinine, Ser: 1.28 mg/dL — ABNORMAL HIGH (ref 0.61–1.24)
Creatinine, Ser: 1.3 mg/dL — ABNORMAL HIGH (ref 0.61–1.24)
GFR calc Af Amer: >60 mL/min (ref >60)
GFR calc Af Amer: >60 mL/min (ref >60)
GFR calc Af Amer: >60 mL/min (ref >60)
GFR calc non Af Amer: 57 mL/min — ABNORMAL LOW (ref >60)
GFR calc non Af Amer: 58 mL/min — ABNORMAL LOW (ref >60)
GFR calc non Af Amer: >60 mL/min (ref >60)
GFR, EST NON AFRICAN AMERICAN: 59 mL/min — AB (ref >60)
GLUCOSE: 107 mg/dL — AB (ref 65–99)
GLUCOSE: 112 mg/dL — AB (ref 65–99)
GLUCOSE: 98 mg/dL (ref 65–99)
Glucose, Bld: 103 mg/dL — ABNORMAL HIGH (ref 65–99)
Glucose, Bld: 100 mg/dL — ABNORMAL HIGH (ref 65–99)
PHOSPHORUS: 2.4 mg/dL — AB (ref 2.5–4.6)
PHOSPHORUS: 2.5 mg/dL (ref 2.5–4.6)
POTASSIUM: 4.2 mmol/L (ref 3.5–5.1)
POTASSIUM: 4.5 mmol/L (ref 3.5–5.1)
Phosphorus: 2 mg/dL — ABNORMAL LOW (ref 2.5–4.6)
Phosphorus: 2.3 mg/dL — ABNORMAL LOW (ref 2.5–4.6)
Phosphorus: 2.3 mg/dL — ABNORMAL LOW (ref 2.5–4.6)
Potassium: 4.6 mmol/L (ref 3.5–5.1)
Potassium: 4.3 mmol/L (ref 3.5–5.1)
Potassium: 4.5 mmol/L (ref 3.5–5.1)
SODIUM: 139 mmol/L (ref 135–145)
SODIUM: 137 mmol/L (ref 135–145)
Sodium: 137 mmol/L (ref 135–145)
Sodium: 138 mmol/L (ref 135–145)
Sodium: 138 mmol/L (ref 135–145)

## 2015-06-17 LAB — BASIC METABOLIC PANEL
Anion gap: 3 — ABNORMAL LOW (ref 5–15)
BUN: 18 mg/dL (ref 6–20)
CHLORIDE: 107 mmol/L (ref 101–111)
CO2: 29 mmol/L (ref 22–32)
Calcium: 8.2 mg/dL — ABNORMAL LOW (ref 8.9–10.3)
Creatinine, Ser: 1.23 mg/dL (ref 0.61–1.24)
GFR calc Af Amer: >60 mL/min (ref >60)
GFR calc non Af Amer: >60 mL/min (ref >60)
GLUCOSE: 94 mg/dL (ref 65–99)
POTASSIUM: 4.4 mmol/L (ref 3.5–5.1)
Sodium: 139 mmol/L (ref 135–145)

## 2015-06-17 LAB — BLOOD GAS, ARTERIAL
Acid-Base Excess: 2.5 mmol/L (ref 0.0–3.0)
BICARBONATE: 28.4 meq/L — AB (ref 21.0–28.0)
FIO2: 0.35
MECHANICAL RATE: 24
MECHVT: 500 mL
O2 Saturation: 94.6 %
PATIENT TEMPERATURE: 37
PEEP: 5 cmH2O
PO2 ART: 75 mmHg — AB (ref 83.0–108.0)
pCO2 arterial: 48 mmHg (ref 32.0–48.0)
pH, Arterial: 7.38 (ref 7.350–7.450)

## 2015-06-17 LAB — MAGNESIUM
MAGNESIUM: 1.6 mg/dL — AB (ref 1.7–2.4)
MAGNESIUM: 1.7 mg/dL (ref 1.7–2.4)
MAGNESIUM: 1.7 mg/dL (ref 1.7–2.4)
MAGNESIUM: 2.3 mg/dL (ref 1.7–2.4)
Magnesium: 1.7 mg/dL (ref 1.7–2.4)

## 2015-06-17 LAB — ALBUMIN: Albumin: 2.3 g/dL — ABNORMAL LOW (ref 3.5–5.0)

## 2015-06-17 LAB — PHOSPHORUS: Phosphorus: 2.2 mg/dL — ABNORMAL LOW (ref 2.5–4.6)

## 2015-06-17 MED ORDER — SODIUM PHOSPHATE 3 MMOLE/ML IV SOLN
20.0000 mmol | Freq: Once | INTRAVENOUS | Status: AC
  Administered 2015-06-17: 20 mmol via INTRAVENOUS
  Filled 2015-06-17: qty 6.67

## 2015-06-17 MED ORDER — POTASSIUM PHOSPHATES 15 MMOLE/5ML IV SOLN
10.0000 mmol | Freq: Once | INTRAVENOUS | Status: DC
  Filled 2015-06-17: qty 3.33

## 2015-06-17 MED ORDER — POTASSIUM PHOSPHATES 15 MMOLE/5ML IV SOLN
10.0000 mmol | Freq: Once | INTRAVENOUS | Status: AC
  Administered 2015-06-17: 10 mmol via INTRAVENOUS
  Filled 2015-06-17: qty 3.33

## 2015-06-17 MED ORDER — MAGNESIUM SULFATE 2 GM/50ML IV SOLN
2.0000 g | Freq: Once | INTRAVENOUS | Status: AC
  Administered 2015-06-17: 2 g via INTRAVENOUS
  Filled 2015-06-17: qty 50

## 2015-06-17 NOTE — Progress Notes (Signed)
[place] NOTE  Pharmacy Consult for CRRT medication adjustment, Electrolytes, Antibiotic renal adjustment. Indication: ICU Status   Allergies  Allergen Reactions  . Penicillins Nausea And Vomiting    Patient Measurements: Height: 5\' 8"  (172.7 cm) Weight: 271 lb 2.7 oz (123 kg) IBW/kg (Calculated) : 68.4   Vital Signs: Temp: 97.9 F (36.6 C) (08/21 1200) Temp Source: Core (Comment) (08/21 0400) BP: 89/56 mmHg (08/21 1200) Pulse Rate: 73 (08/21 1200) Intake/Output from previous day: 08/20 0701 - 08/21 0700 In: 2490.2 [I.V.:1375.2; NG/GT:495; IV Piggyback:610] Out: 2931 [Urine:30] Intake/Output from this shift: Total I/O In: 535 [I.V.:535] Out: 241 [Other:241] Vent settings for last 24 hours: Vent Mode:  [-] PRVC FiO2 (%):  [35 %] 35 % Set Rate:  [24 bmp] 24 bmp Vt Set:  [500 mL] 500 mL PEEP:  [5 cmH20] 5 cmH20  Labs:  Recent Labs  06/15/15 1609  06/16/15 0006  06/17/15 0021 06/17/15 0423 06/17/15 0727  WBC 4.2  --  5.4  --   --  5.3  --   HGB 10.6*  --  11.0*  --   --  10.8*  --   HCT 34.5*  --  34.3*  --   --  33.9*  --   PLT 46*  --  36*  --   --  33*  --   CREATININE  --   < > 1.56*  < > 1.28* 1.23 1.25*  MG 1.7  < > 1.7  < > 1.7 1.7 1.7  PHOS  --   < > 2.8  < > 2.3* 2.2* 2.4*  ALBUMIN  --   < > 2.3*  < > 2.4* 2.3* 2.3*  < > = values in this interval not displayed. Estimated Creatinine Clearance: 78.2 mL/min (by C-G formula based on Cr of 1.25). ** on CRRT**   Recent Labs  06/17/15 0435 06/17/15 0708 06/17/15 1100  GLUCAP 80 90 85    Microbiology: Recent Results (from the past 720 hour(s))  Blood Culture (routine x 2)     Status: None   Collection Time: 06/10/15  2:35 PM  Result Value Ref Range Status   Specimen Description BLOOD RIGHT WRIST  Final   Special Requests BOTTLES DRAWN AEROBIC AND ANAEROBIC  1CC  Final   Culture NO GROWTH 5 DAYS  Final   Report Status 06/15/2015 FINAL  Final  Blood Culture (routine x 2)      Status: None   Collection Time: 06/10/15  2:40 PM  Result Value Ref Range Status   Specimen Description BLOOD LEFT ASSIST CONTROL  Final   Special Requests   Final    BOTTLES DRAWN AEROBIC AND ANAEROBIC  AER 6CC ANA 4CC   Culture NO GROWTH 5 DAYS  Final   Report Status 06/15/2015 FINAL  Final  Urine culture     Status: None   Collection Time: 06/10/15  4:42 PM  Result Value Ref Range Status   Specimen Description URINE, RANDOM  Final   Special Requests NONE  Final   Culture 4,000 COLONIES/mL INSIGNIFICANT GROWTH  Final   Report Status 06/12/2015 FINAL  Final  MRSA PCR Screening     Status: None   Collection Time: 06/10/15  6:27 PM  Result Value Ref Range Status   MRSA by PCR NEGATIVE NEGATIVE Final    Comment:        The GeneXpert MRSA Assay (FDA approved for NASAL specimens only), is one component of a comprehensive MRSA colonization  surveillance program. It is not intended to diagnose MRSA infection nor to guide or monitor treatment for MRSA infections.   Culture, sputum-assessment     Status: None   Collection Time: 06/11/15  2:50 PM  Result Value Ref Range Status   Specimen Description SPUTUM  Final   Special Requests NONE  Final   Sputum evaluation THIS SPECIMEN IS ACCEPTABLE FOR SPUTUM CULTURE  Final   Report Status 06/13/2015 FINAL  Final  Culture, respiratory (NON-Expectorated)     Status: None   Collection Time: 06/11/15  2:50 PM  Result Value Ref Range Status   Specimen Description SPUTUM  Final   Special Requests NONE Reflexed from [id number]  Final   Gram Stain   Final    MODERATE WBC SEEN RARE GRAM POSITIVE COCCI GOOD SPECIMEN - 80-90% WBCS    Culture MODERATE GROWTH STAPHYLOCOCCUS AUREUS  Final   Report Status 06/14/2015 FINAL  Final   Organism ID, Bacteria STAPHYLOCOCCUS AUREUS  Final      Susceptibility   Staphylococcus aureus - MIC*    CIPROFLOXACIN <=0.5 SENSITIVE Sensitive     GENTAMICIN <=0.5 SENSITIVE Sensitive     OXACILLIN 0.5 SENSITIVE  Sensitive     TRIMETH/SULFA <=10 SENSITIVE Sensitive     CEFOXITIN SCREEN NEGATIVE Sensitive     Inducible Clindamycin NEGATIVE Sensitive     TETRACYCLINE Value in next row Sensitive      SENSITIVE<=1    * MODERATE GROWTH STAPHYLOCOCCUS AUREUS  Wound culture     Status: None   Collection Time: 06/13/15  1:56 PM  Result Value Ref Range Status   Specimen Description LEG  Final   Special Requests Normal  Final   Gram Stain   Final    FEW WBC SEEN MODERATE GRAM NEGATIVE RODS FEW GRAM POSITIVE COCCI    Culture   Final    LIGHT GROWTH STAPHYLOCOCCUS AUREUS RARE GROWTH PROTEUS PENNERI RARE GROWTH ENTEROCOCCUS FAECALIS    Report Status 06/17/2015 FINAL  Final   Organism ID, Bacteria STAPHYLOCOCCUS AUREUS  Final   Organism ID, Bacteria PROTEUS PENNERI  Final   Organism ID, Bacteria ENTEROCOCCUS FAECALIS  Final      Susceptibility   Staphylococcus aureus - MIC*    CIPROFLOXACIN <=0.5 SENSITIVE Sensitive     GENTAMICIN <=0.5 SENSITIVE Sensitive     OXACILLIN 0.5 SENSITIVE Sensitive     TRIMETH/SULFA <=10 SENSITIVE Sensitive     CEFOXITIN SCREEN NEGATIVE Sensitive     Inducible Clindamycin NEGATIVE Sensitive     TETRACYCLINE Value in next row Sensitive      SENSITIVE<=1    * LIGHT GROWTH STAPHYLOCOCCUS AUREUS   Proteus penneri - MIC*    AMPICILLIN Value in next row Resistant      SENSITIVE<=1    CEFAZOLIN Value in next row Resistant      SENSITIVE<=1    CEFTRIAXONE Value in next row Sensitive      SENSITIVE<=1    CIPROFLOXACIN Value in next row Sensitive      SENSITIVE<=1    GENTAMICIN Value in next row Sensitive      SENSITIVE<=1    IMIPENEM Value in next row Sensitive      SENSITIVE<=1    NITROFURANTOIN Value in next row Resistant      SENSITIVE<=1    TRIMETH/SULFA Value in next row Sensitive      SENSITIVE<=1    PIP/TAZO Value in next row Sensitive      SENSITIVE<=4    * RARE GROWTH PROTEUS PENNERI  Enterococcus faecalis - MIC*    AMPICILLIN Value in next row  Sensitive      SENSITIVE<=4    LINEZOLID Value in next row Sensitive      SENSITIVE<=4    * RARE GROWTH ENTEROCOCCUS FAECALIS  Wound culture     Status: None   Collection Time: 06/13/15  1:58 PM  Result Value Ref Range Status   Specimen Description LEG  Final   Special Requests Normal  Final   Gram Stain RARE WBC SEEN FEW GRAM NEGATIVE RODS   Final   Culture NO GROWTH 4 DAYS  Final   Report Status 06/17/2015 FINAL  Final    Medications:  Scheduled:  . amiodarone  400 mg Oral BID  . antiseptic oral rinse  7 mL Mouth Rinse QID  . budesonide (PULMICORT) nebulizer solution  0.5 mg Nebulization BID  . chlorhexidine gluconate  15 mL Mouth Rinse BID  . ciprofloxacin  400 mg Intravenous Q12H  . clonazePAM  0.5 mg Oral BID  . famotidine  20 mg Oral Q24H  . ipratropium-albuterol  3 mL Nebulization Q6H   Infusions:  . dextrose 5 % and 0.45% NaCl 75 mL/hr at 06/17/15 1044  . fentaNYL infusion INTRAVENOUS 400 mcg/hr (06/17/15 1200)  . norepinephrine    . pureflow 2,500 mL/hr at 06/17/15 1205    Assessment: 64 yo male requiring mechanical ventilation and CRRT.   Patient reintubated 8/17. ID consult-MSSA PNA, cellulitis Tube feeds d/c 8/21- ileus. Possible Terminal wean off vent Monday. Platelets 33-on Sodium citrate with CRRT.  Plan:  1. Cipro 400mg  IV Q12h started per pharmacy (switch from Unasyn).  (per Uptodate for CRRT: Cipro 200-400mg  every12h-24 hrs) 2. Phos low at 2.3 this am, K=4.6. Potassium Phosphate 10 mmol IV given x1.  Phos level 8/21 at 0727= 2.4. Recheck with Renal panel ordered per nephrology.  3.  CRRT : no futher adjustments at this time.  Pharmacy will continue to monitor and adjust per consult.   Chinita Greenland PharmD Clinical Pharmacist 06/17/2015'

## 2015-06-17 NOTE — Progress Notes (Signed)
Asked [name] if pt can still receive po medications via OG tube due to KUB results she stated it was ok to give po medications via OG tube

## 2015-06-17 NOTE — Progress Notes (Signed)
Pt remains intubated on 35% FiO2 with O2 sats upper 90's when not stimulated however with activity pts O2 sats decrease to the mid 80's;other vss; pt remains oliguric with CRRT infusing; s/s of agitation and pain therefore medication administered and pt received relief; tube feeds to remain discontinued due to an ileus shown on the KUB; will continue to monitor and assess pt

## 2015-06-17 NOTE — Progress Notes (Signed)
Brief Nutrition Follow-Up:  Coordination of Care: RD notes TF currently on hold secondary to KUB results.  Will follow poc and make recommendations accordingly.   Dwyane Luo, [place], LDN Pager [phone]

## 2015-06-17 NOTE — Progress Notes (Signed)
Informed Dr. Juanell Fairly about KUB results given orders to discontinue tube feeds

## 2015-06-17 NOTE — Progress Notes (Signed)
[facility] NAME: Aaron Harrison    MR#:  [id number]  DATE OF BIRTH:  04/25/1951  SUBJECTIVE:  Remains intubated and on the vent. On IV fentanyl. Sedated No fever  CRRT being tolerated TF on hold flexiseal+ REVIEW OF SYSTEMS:   Review of Systems  Unable to perform ROS: intubated   Tolerating Diet:TF on hold Tolerating PT: no,on vent  DRUG ALLERGIES:   Allergies  Allergen Reactions  . Penicillins Nausea And Vomiting    VITALS:  Blood pressure 93/70, pulse 81, temperature 97.9 F (36.6 C), temperature source Core (Comment), resp. rate 24, height 5\' 8"  (1.727 m), weight 123 kg (271 lb 2.7 oz), SpO2 100 %.  PHYSICAL EXAMINATION:   Physical Exam  GENERAL:  64 y.o.-year-old patient lying in the bed with no acute distress. critically ill anasarca EYES: Pupils equal, round, reactive to light and accommodation. No scleral icterus. Extraocular muscles intact.  HEENT: Head atraumatic, normocephalic. Oropharynx and nasopharynx clear.  -orally intubated NECK:  Supple, no jugular venous distention. No thyroid enlargement, no tenderness.  LUNGS: Normal breath sounds bilaterally, no wheezing, rales, rhonchi. No use of accessory muscles of respiration. Left upper chest CL+ CARDIOVASCULAR: S1, S2 normal. No murmurs, rubs, or gallops.  ABDOMEN: Soft, nontender, nondistended. Bowel sounds present. No organomegaly or mass.  -severe scrotal edema EXTREMITIES: bilateral ++++ edema b/l with weeping skin ulcers on tibial shin NEUROLOGIC: sedated and on the vent PSYCHIATRIC: on the vent SKIN:bilateral LE cellulitis and weeping skin ulcers over the tibial shin  LABORATORY PANEL:   CBC  Recent Labs Lab 06/17/15 0423  WBC 5.3  HGB 10.8*  HCT 33.9*  PLT 33*    Chemistries   Recent Labs Lab 06/11/15 1519  06/17/15 0727  NA 138  < > 139  K 4.8  < > 4.6  CL 106  < > 106  CO2 24  < > 29  GLUCOSE 109*  < > 103*  BUN 55*   < > 18  CREATININE 2.64*  < > 1.25*  CALCIUM 7.9*  < > 8.3*  MG  --   < > 1.7  AST 108*  --   --   ALT 112*  --   --   ALKPHOS 68  --   --   BILITOT 5.4*  --   --   < > = values in this interval not displayed.  Cardiac Enzymes  Recent Labs Lab 06/11/15 0312  TROPONINI 0.27*    RADIOLOGY:  Dg Chest 1 View  06/17/2015   CLINICAL DATA:  Patient with acute respiratory failure and community acquired pneumonia.  EXAM: CHEST  1 VIEW  COMPARISON:  Chest radiograph 06/15/2015  FINDINGS: ET tube terminates in the distal trachea. Left subclavian central venous catheter tip projects over the superior vena cava. Enteric tube courses inferior to the diaphragm. Multiple additional monitoring leads overlie the patient. Stable cardiomegaly. Low lung volumes. Diffuse bilateral interstitial pulmonary opacities. Small bilateral pleural effusions. No definite pneumothorax.  IMPRESSION: Stable support apparatus.  Cardiomegaly and mild interstitial edema.   Electronically Signed   By: Lovey Newcomer M.D.   On: 06/17/2015 09:15   Dg Abd 1 View  06/17/2015   CLINICAL DATA:  Abdominal distention.  Sepsis.  Respiratory failure.  EXAM: ABDOMEN - 1 VIEW  COMPARISON:  06/13/2015  FINDINGS: Gaseous distention is seen involving the nondependent portions of the colon, mainly the transverse colon, consistent with adynamic ileus. Cecum measures approximately  9.4 cm in diameter. No dilated small bowel loops seen. Catheter is noted within the bladder. Right femoral venous catheter also noted.  IMPRESSION: Moderate colonic ileus.   Electronically Signed   By: Earle Gell M.D.   On: 06/17/2015 08:40   US Abdomen Complete  06/16/2015   CLINICAL DATA:  Patient with liver and kidney disease. Respiratory failure.  EXAM: ULTRASOUND ABDOMEN COMPLETE  COMPARISON:  None.  FINDINGS: Gallbladder: Probable sludge within the gallbladder lumen. No gallbladder wall thickening. No pericholecystic fluid. Unable to assess for sonographic Murphy's  sign.  Common bile duct: Diameter: Approximately 3 mm  Liver: No focal lesion identified. Within normal limits in parenchymal echogenicity.  IVC: No abnormality visualized.  Pancreas: Not visualized due to overlying bowel gas and body habitus.  Spleen: Size and appearance within normal limits.  Right Kidney: Length: 14.2 cm. No hydronephrosis. There is a 7 cm cyst off the superior pole.  Left Kidney: Length: 12.4 cm. No hydronephrosis. There is a 2.2 cm hypoechoic lesion within the interpolar region of the left kidney, incompletely characterized.  Abdominal aorta: Not well visualized.  Other findings: None.  IMPRESSION: Possible sludge within the gallbladder lumen. No definite sonographic evidence to suggest acute cholecystitis.  No hydronephrosis.  Indeterminate hypoechoic mass within the interpolar region left kidney, potentially representing a complicated cyst. Solid mass is not excluded. Consider correlation with dedicated cross-sectional imaging when patient clinically able.   Electronically Signed   By: Lovey Newcomer M.D.   On: 06/16/2015 18:21     ASSESSMENT AND PLAN:  64 y.o. male with a known history of A. fib, CHF came in with  * Acute respiratory failure with hypoxia: intubated in ED, extubated on August 15 - Reintubated on August 17 due to increased work of breathing. - On  po amiodarone and fentanyl drip.  * Sepsis with multiorgan failure: Likely hospital-acquired pneumonia  Infectious disease input appreciated  -BC negative -wbc stable -no fever  * MSSA Pneumonia- was on  IV Unasyn  * Acute renal failure: Possible cardiorenal syndrome due to underlying CHF. Appreciate nephrology input - temporary dialysis catheter placed in the right groin and CRRT started 8/17  * Acute on chronic systolic CHF  Echo showing ejection fraction 30% to 35% -CRRT with some UF -pt has  severe anasarca  * A. fib with RVR:  Not able to use rate controlling medication due to hypotension. On po   amiodarone --In the past pt had refused anticoagulation -Continue aspirin for now  * Elevated troponin: Due to supply demand ischemia  * Bilateral lower extremity cellulitis: change to IV cipro given proteus in the wound * Hyperkalemia: Resolved  * Ascites/Abnormal liver function test: GI following, on lactulose  *palliative care consult noted. Pt has a very poor prognosis given multiorgan failure.no family. Note from palliatve care noted.possible terminal wean on Monday.  Case discussed with Care Management/Social Worker. Management plans discussed with the patient, family and they are in agreement.  CODE STATUS: DNR DVT Prophylaxis: sq heparin  TOTAL Critical TIME TAKING CARE OF THIS PATIENT: 64minutes.  >50% time spent on counselling and coordination of care    Gram Siedlecki M.D on 06/17/2015 at 10:37 AM  Between 7am to 6pm - Pager - [phone]  After 6pm go to [email or web address] - password [email or web address] [facility]  [facility]  Office  [phone]  CC: Primary care physician; No PCP Per Patient

## 2015-06-17 NOTE — Progress Notes (Signed)
Electrolyte RELATED CONSULT NOTE - FOLLOW UP   Pharmacy Consult for Electrolytes   Allergies  Allergen Reactions  . Penicillins Nausea And Vomiting    Patient Measurements: Height: 5\' 8"  (172.7 cm) Weight: 271 lb 2.7 oz (123 kg) IBW/kg (Calculated) : 68.4   Vital Signs: Temp: 97.7 F (36.5 C) (08/21 1300) Temp Source: Core (Comment) (08/21 0400) BP: 95/63 mmHg (08/21 1300) Pulse Rate: 79 (08/21 1300) Intake/Output from previous day: 08/20 0701 - 08/21 0700 In: 2490.2 [I.V.:1375.2; NG/GT:495; IV Piggyback:610] Out: 2931 [Urine:30] Intake/Output from this shift: Total I/O In: 610 [I.V.:610] Out: 241 [Other:241]  Labs:  Recent Labs  06/15/15 1609  06/16/15 0006  06/17/15 0423 06/17/15 0727 06/17/15 1151  WBC 4.2  --  5.4  --  5.3  --   --   HGB 10.6*  --  11.0*  --  10.8*  --   --   HCT 34.5*  --  34.3*  --  33.9*  --   --   PLT 46*  --  36*  --  33*  --   --   CREATININE  --   < > 1.56*  < > 1.23 1.25* 1.27*  MG 1.7  < > 1.7  < > 1.7 1.7 1.6*  PHOS  --   < > 2.8  < > 2.2* 2.4* 2.0*  ALBUMIN  --   < > 2.3*  < > 2.3* 2.3* 2.1*  < > = values in this interval not displayed. Estimated Creatinine Clearance: 76.9 mL/min (by C-G formula based on Cr of 1.27).     Assessment: Patient is a 64 yo male requiring mechanical ventilation and CRRT. Pharmacy to monitor and adjust electrolytes. Possible terminal wean off vent Monday.   Plan:  Labs from 1151 today (06/17/15): Phos low at 2.0, down from 2.4 this morning. K 4.3, Na 137, and Mg 1.6 (down from 1.7 this morning).  Will order Mg 2gm IV bolus at 1400 and sodium phos 10mmol/250mL to begin at 1500 today. Renal function panel ordered every 4 hours. Pharmacy to continue to follow up and adjust electrolytes.    Kaleena Corrow M Jalecia Leon,PharmD 06/17/2015,1:47 PM

## 2015-06-17 NOTE — Progress Notes (Signed)
[facility] Progess Note     ASSESSMENAT/PLAN   64 yo white male admitted to ICU for acute  hypercapnic and hypoxic resp failure from acute CHF exacerbation with acute liver dysfunction/liver failure with acute renal failure with acute encephalopathy with mixed resp and metabolic acidosis with recurrent resp failure. Pt grew MSSA in sputum, suspect pneumonia.  Pt failed extubation and reintubated. Possible terminal wean on Monday.   PULMONARY 1.Respiratory Failure-patient reintubated  due to worsening cardio-renal syndrome -continue Full MV support, currently on vent with rate of 24, PRVC, peep 5.  -MSSA in sputum. Possible pneumonia on antibiotics. -continue Bronchodilator Therapy -Wean Fio2 and PEEP as tolerated  CARDIOVASCULAR ACUTE CHF, chronic atrial fibrillation with RVR, now controlled. Continue amiodarone via tube, and restart IV if his heart rate goes back up. -History of systolic congestive heart failure with an ejection fraction of 35% -intubated -continue vent support  RENAL Renal Failure-most likely due to ATN, currently on CRRT area -follow chem 7 -follow UO -continue Foley Catheter-assess need -vasc cath placed for CRRT-nephrology consulted   GASTROINTESTINAL GI Prophylaxic -OG placed-on TF's -Patient now noted to have ileus on abdominal x-ray tube feeds have been turned off.  HEMATOLOGIC Follow h/h  INFECTIOUS ?cellulitis of LE-wound culture positive for multiple organisms including E faecalis MSSA, and Proteus. -on unasyn -. Sputum culture positive for MSSA. Continue unasyn for MSSA pneumonia, ID following.   ENDOCRINE - ICU hypoglycemic\Hyperglycemia protocol   NEUROLOGIC - intubated and sedated - minimal sedation to achieve a RASS goal: -1     -Patient was discussed with palliative care and with the critical care RN. Continue current vent support. Patient might undergo a terminal wean tomorrow, he does not appear  to be a good weaning candidate due to continued reduced mental status.  -DVT prophylaxis, on heparin -Gi Prophylaxis, on tube feeds, Famotidine.  ----------------------------------------   Name: DANN GALICIA MRN: [id number] DOB: 1951-10-05    ADMISSION DATE:  06/10/2015  SUBJECTIVE:   Pt currently on the ventilator, can not provide history or review of systems.   Review of Systems:  Pt currently on the ventilator, can not provide history or review of systems.    VITAL SIGNS: Temp:  [97.2 F (36.2 C)-98.2 F (36.8 C)] 97.9 F (36.6 C) (08/21 0900) Pulse Rate:  [29-99] 81 (08/21 0900) Resp:  [19-25] 24 (08/21 0800) BP: (82-113)/(52-86) 93/70 mmHg (08/21 0900) SpO2:  [80 %-100 %] 100 % (08/21 0900) FiO2 (%):  [35 %] 35 % (08/21 0736) Weight:  [123 kg (271 lb 2.7 oz)] 123 kg (271 lb 2.7 oz) (08/21 0500) HEMODYNAMICS:   VENTILATOR SETTINGS: Vent Mode:  [-] PRVC FiO2 (%):  [35 %] 35 % Set Rate:  [24 bmp] 24 bmp Vt Set:  [500 mL] 500 mL PEEP:  [5 cmH20] 5 cmH20 INTAKE / OUTPUT:  Intake/Output Summary (Last 24 hours) at 06/17/15 0955 Last data filed at 06/17/15 0800  Gross per 24 hour  Intake 2311.8 ml  Output   2808 ml  Net -496.2 ml    PHYSICAL EXAMINATION: Physical Examination:   VS: BP 93/70 mmHg  Pulse 81  Temp(Src) 97.9 F (36.6 C) (Core (Comment))  Resp 24  Ht 5\' 8"  (1.727 m)  Wt 123 kg (271 lb 2.7 oz)  BMI 41.24 kg/m2  SpO2 100%  General Appearance: No distress  Neuro:without focal findings, mental status normal. HEENT: PERRLA, EOM intact. Pulmonary: normal breath sounds   CardiovascularNormal S1,S2.  No m/r/g.   Abdomen:  Benign, Soft, non-tender. Renal:  No costovertebral tenderness  GU:  Not performed at this time. Endocrine: No evident thyromegaly. Skin:   warm, no rashes, no ecchymosis  Extremities: normal, no cyanosis, clubbing.   LABS:  CBC  Recent Labs Lab 06/15/15 1609 06/16/15 0006 06/17/15 0423  WBC 4.2 5.4 5.3  HGB 10.6*  11.0* 10.8*  HCT 34.5* 34.3* 33.9*  PLT 46* 36* 33*   Coag's  Recent Labs Lab 06/10/15 1435  APTT 32  INR 2.00   BMET  Recent Labs Lab 06/17/15 0021 06/17/15 0423 06/17/15 0727  NA 138 139 139  K 4.2 4.4 4.6  CL 106 107 106  CO2 28 29 29   BUN 18 18 18   CREATININE 1.28* 1.23 1.25*  GLUCOSE 98 94 103*   Electrolytes  Recent Labs Lab 06/17/15 0021 06/17/15 0423 06/17/15 0727  CALCIUM 8.2* 8.2* 8.3*  MG 1.7 1.7 1.7  PHOS 2.3* 2.2* 2.4*   Sepsis Markers  Recent Labs Lab 06/10/15 1435 06/10/15 1837  LATICACIDVEN 2.1* 4.0*   ABG  Recent Labs Lab 06/13/15 0955 06/14/15 0915 06/17/15 0408  PHART 7.22* 7.37 7.38  PCO2ART 55* 44 48  PO2ART 117* 81* 75*   Liver Enzymes  Recent Labs Lab 06/10/15 1435 06/11/15 1519  06/17/15 0021 06/17/15 0423 06/17/15 0727  AST 301* 108*  --   --   --   --   ALT 150* 112*  --   --   --   --   ALKPHOS 87 68  --   --   --   --   BILITOT 6.7* 5.4*  --   --   --   --   ALBUMIN 3.5 2.8*  < > 2.4* 2.3* 2.3*  < > = values in this interval not displayed. Cardiac Enzymes  Recent Labs Lab 06/10/15 1435 06/11/15 0040 06/11/15 0312  TROPONINI 0.20* 0.22* 0.27*   Glucose  Recent Labs Lab 06/16/15 2049 06/16/15 2126 06/16/15 2208 06/16/15 2350 06/17/15 0435 06/17/15 0708  GLUCAP 54* 76 81 82 80 90    Imaging I did review the chest x-ray and abdominal x-ray films as well as the reports. Chest x-ray shows continued pulmonary edema and atelectasis. The abdominal x-ray shows evidence of ileus. Dg Chest 1 View  06/17/2015   CLINICAL DATA:  Patient with acute respiratory failure and community acquired pneumonia.  EXAM: CHEST  1 VIEW  COMPARISON:  Chest radiograph 06/15/2015  FINDINGS: ET tube terminates in the distal trachea. Left subclavian central venous catheter tip projects over the superior vena cava. Enteric tube courses inferior to the diaphragm. Multiple additional monitoring leads overlie the patient. Stable  cardiomegaly. Low lung volumes. Diffuse bilateral interstitial pulmonary opacities. Small bilateral pleural effusions. No definite pneumothorax.  IMPRESSION: Stable support apparatus.  Cardiomegaly and mild interstitial edema.   Electronically Signed   By: Lovey Newcomer M.D.   On: 06/17/2015 09:15   Dg Abd 1 View  06/17/2015   CLINICAL DATA:  Abdominal distention.  Sepsis.  Respiratory failure.  EXAM: ABDOMEN - 1 VIEW  COMPARISON:  06/13/2015  FINDINGS: Gaseous distention is seen involving the nondependent portions of the colon, mainly the transverse colon, consistent with adynamic ileus. Cecum measures approximately 9.4 cm in diameter. No dilated small bowel loops seen. Catheter is noted within the bladder. Right femoral venous catheter also noted.  IMPRESSION: Moderate colonic ileus.   Electronically Signed   By: Earle Gell M.D.   On: 06/17/2015 08:40   US Abdomen Complete  06/16/2015   CLINICAL DATA:  Patient with liver and kidney disease. Respiratory failure.  EXAM: ULTRASOUND ABDOMEN COMPLETE  COMPARISON:  None.  FINDINGS: Gallbladder: Probable sludge within the gallbladder lumen. No gallbladder wall thickening. No pericholecystic fluid. Unable to assess for sonographic Murphy's sign.  Common bile duct: Diameter: Approximately 3 mm  Liver: No focal lesion identified. Within normal limits in parenchymal echogenicity.  IVC: No abnormality visualized.  Pancreas: Not visualized due to overlying bowel gas and body habitus.  Spleen: Size and appearance within normal limits.  Right Kidney: Length: 14.2 cm. No hydronephrosis. There is a 7 cm cyst off the superior pole.  Left Kidney: Length: 12.4 cm. No hydronephrosis. There is a 2.2 cm hypoechoic lesion within the interpolar region of the left kidney, incompletely characterized.  Abdominal aorta: Not well visualized.  Other findings: None.  IMPRESSION: Possible sludge within the gallbladder lumen. No definite sonographic evidence to suggest acute cholecystitis.   No hydronephrosis.  Indeterminate hypoechoic mass within the interpolar region left kidney, potentially representing a complicated cyst. Solid mass is not excluded. Consider correlation with dedicated cross-sectional imaging when patient clinically able.   Electronically Signed   By: Lovey Newcomer M.D.   On: 06/16/2015 18:21     --Marda Stalker, MD.  Pager [phone] [facility] Office Number: [phone]  Patricia Pesa, M.D.  Vilinda Boehringer, M.D.  Cheral Marker, M.D  [place].  I have personally obtained a history, examined the patient, evaluated laboratory and imaging results, formulated the assessment and plan and placed orders. The Patient requires high complexity decision making for assessment and support, frequent evaluation and titration of therapies, application of advanced monitoring technologies and extensive interpretation of multiple databases. The patient has critical illness that could lead imminently to failure of 1 or more organ systems and requires the highest level of physician preparedness to intervene.  Critical Care Time devoted to patient care services described in this note is 35 minutes and is exclusive of time spent in procedures.

## 2015-06-17 NOTE — Progress Notes (Signed)
[facility]  ROUNDING NOTE   Subjective:  Pt remains on CRRT. Remains oliguric. Good UF with CRRT.   Objective:  Vital signs in last 24 hours:  Temp:  [97.2 F (36.2 C)-98.2 F (36.8 C)] 97.7 F (36.5 C) (08/21 1300) Pulse Rate:  [35-99] 79 (08/21 1300) Resp:  [19-25] 24 (08/21 0800) BP: (82-113)/(56-86) 95/63 mmHg (08/21 1300) SpO2:  [80 %-100 %] 100 % (08/21 1300) FiO2 (%):  [35 %] 35 % (08/21 1119) Weight:  [123 kg (271 lb 2.7 oz)] 123 kg (271 lb 2.7 oz) (08/21 0500)  Weight change: -5.2 kg (-11 lb 7.4 oz) Filed Weights   06/15/15 0440 06/16/15 0500 06/17/15 0500  Weight: 124.6 kg (274 lb 11.1 oz) 128.2 kg (282 lb 10.1 oz) 123 kg (271 lb 2.7 oz)    Intake/Output: I/O last 3 completed shifts: In: 4062.3 [I.V.:1952.3; Other:10; NG/GT:1290; IV Piggyback:810] Out: 3790 [Urine:50; Other:3989]   Intake/Output this shift:  Total I/O In: 610 [I.V.:610] Out: 241 [Other:241]  Physical Exam: General: Critically ill appearing  Head: Normocephalic, atraumatic. ETT in place  Eyes: Eyes closed  Neck: Supple, trachea midline  Lungs:  Bilateral rhonchi vent assisted  Heart: Irregular S1S2 no rubs  Abdomen:  Soft, nontender, mild distension  Extremities:  2+ peripheral edema  Neurologic: Intubated, arousable however  Skin: ulcertaion bilateral lower extremeties  Access: Femoral dialysis catheter    Basic Metabolic Panel:  Recent Labs Lab 06/16/15 2033 06/17/15 0021 06/17/15 0423 06/17/15 0727 06/17/15 1151  NA 138 138 139 139 137  K 4.0 4.2 4.4 4.6 4.3  CL 105 106 107 106 106  CO2 26 28 29 29 27   GLUCOSE 122* 98 94 103* 112*  BUN 19 18 18 18 17   CREATININE 1.39* 1.28* 1.23 1.25* 1.27*  CALCIUM 8.1* 8.2* 8.2* 8.3* 7.9*  MG 1.8 1.7 1.7 1.7 1.6*  PHOS 2.3* 2.3* 2.2* 2.4* 2.0*    Liver Function Tests:  Recent Labs Lab 06/10/15 1435 06/11/15 1519  06/16/15 2033 06/17/15 0021 06/17/15 0423 06/17/15 0727 06/17/15 1151  AST 301* 108*  --   --    --   --   --   --   ALT 150* 112*  --   --   --   --   --   --   ALKPHOS 87 68  --   --   --   --   --   --   BILITOT 6.7* 5.4*  --   --   --   --   --   --   PROT 7.2 6.0*  --   --   --   --   --   --   ALBUMIN 3.5 2.8*  < > 2.4* 2.4* 2.3* 2.3* 2.1*  < > = values in this interval not displayed.  Recent Labs Lab 06/10/15 1435  LIPASE 20*    Recent Labs Lab 06/10/15 1435  AMMONIA 26    CBC:  Recent Labs Lab 06/10/15 1435  06/13/15 0143 06/14/15 0440 06/15/15 1609 06/16/15 0006 06/17/15 0423  WBC 11.1*  < > 8.4 5.9 4.2 5.4 5.3  NEUTROABS 9.8*  --  7.7*  --   --   --   --   HGB 13.4  < > 12.2* 10.7* 10.6* 11.0* 10.8*  HCT 42.0  < > 38.5* 32.9* 34.5* 34.3* 33.9*  MCV 105.2*  < > 107.8* 103.3* 106.7* 104.1* 103.0*  PLT 155  < > 85* 57* 46* 36* 33*  < > =  values in this interval not displayed.  Cardiac Enzymes:  Recent Labs Lab 06/10/15 1435 06/11/15 0040 06/11/15 0312  TROPONINI 0.20* 0.22* 0.27*    BNP: Invalid input(s): POCBNP  CBG:  Recent Labs Lab 06/16/15 2208 06/16/15 2350 06/17/15 0435 06/17/15 0708 06/17/15 1100  GLUCAP 81 82 80 90 85    Microbiology: Results for orders placed or performed during the hospital encounter of 06/10/15  Blood Culture (routine x 2)     Status: None   Collection Time: 06/10/15  2:35 PM  Result Value Ref Range Status   Specimen Description BLOOD RIGHT WRIST  Final   Special Requests BOTTLES DRAWN AEROBIC AND ANAEROBIC  [place]  Final   Culture NO GROWTH 5 DAYS  Final   Report Status 06/15/2015 FINAL  Final  Blood Culture (routine x 2)     Status: None   Collection Time: 06/10/15  2:40 PM  Result Value Ref Range Status   Specimen Description BLOOD LEFT ASSIST CONTROL  Final   Special Requests   Final    BOTTLES DRAWN AEROBIC AND ANAEROBIC  AER 6CC ANA 4CC   Culture NO GROWTH 5 DAYS  Final   Report Status 06/15/2015 FINAL  Final  Urine culture     Status: None   Collection Time: 06/10/15  4:42 PM  Result Value  Ref Range Status   Specimen Description URINE, RANDOM  Final   Special Requests NONE  Final   Culture 4,000 COLONIES/mL INSIGNIFICANT GROWTH  Final   Report Status 06/12/2015 FINAL  Final  MRSA PCR Screening     Status: None   Collection Time: 06/10/15  6:27 PM  Result Value Ref Range Status   MRSA by PCR NEGATIVE NEGATIVE Final    Comment:        The GeneXpert MRSA Assay (FDA approved for NASAL specimens only), is one component of a comprehensive MRSA colonization surveillance program. It is not intended to diagnose MRSA infection nor to guide or monitor treatment for MRSA infections.   Culture, sputum-assessment     Status: None   Collection Time: 06/11/15  2:50 PM  Result Value Ref Range Status   Specimen Description SPUTUM  Final   Special Requests NONE  Final   Sputum evaluation THIS SPECIMEN IS ACCEPTABLE FOR SPUTUM CULTURE  Final   Report Status 06/13/2015 FINAL  Final  Culture, respiratory (NON-Expectorated)     Status: None   Collection Time: 06/11/15  2:50 PM  Result Value Ref Range Status   Specimen Description SPUTUM  Final   Special Requests NONE Reflexed from [id number]  Final   Gram Stain   Final    MODERATE WBC SEEN RARE GRAM POSITIVE COCCI GOOD SPECIMEN - 80-90% WBCS    Culture MODERATE GROWTH STAPHYLOCOCCUS AUREUS  Final   Report Status 06/14/2015 FINAL  Final   Organism ID, Bacteria STAPHYLOCOCCUS AUREUS  Final      Susceptibility   Staphylococcus aureus - MIC*    CIPROFLOXACIN <=0.5 SENSITIVE Sensitive     GENTAMICIN <=0.5 SENSITIVE Sensitive     OXACILLIN 0.5 SENSITIVE Sensitive     TRIMETH/SULFA <=10 SENSITIVE Sensitive     CEFOXITIN SCREEN NEGATIVE Sensitive     Inducible Clindamycin NEGATIVE Sensitive     TETRACYCLINE Value in next row Sensitive      SENSITIVE<=1    * MODERATE GROWTH STAPHYLOCOCCUS AUREUS  Wound culture     Status: None   Collection Time: 06/13/15  1:56 PM  Result Value Ref Range Status  Specimen Description LEG  Final    Special Requests Normal  Final   Gram Stain   Final    FEW WBC SEEN MODERATE GRAM NEGATIVE RODS FEW GRAM POSITIVE COCCI    Culture   Final    LIGHT GROWTH STAPHYLOCOCCUS AUREUS RARE GROWTH PROTEUS PENNERI RARE GROWTH ENTEROCOCCUS FAECALIS    Report Status 06/17/2015 FINAL  Final   Organism ID, Bacteria STAPHYLOCOCCUS AUREUS  Final   Organism ID, Bacteria PROTEUS PENNERI  Final   Organism ID, Bacteria ENTEROCOCCUS FAECALIS  Final      Susceptibility   Staphylococcus aureus - MIC*    CIPROFLOXACIN <=0.5 SENSITIVE Sensitive     GENTAMICIN <=0.5 SENSITIVE Sensitive     OXACILLIN 0.5 SENSITIVE Sensitive     TRIMETH/SULFA <=10 SENSITIVE Sensitive     CEFOXITIN SCREEN NEGATIVE Sensitive     Inducible Clindamycin NEGATIVE Sensitive     TETRACYCLINE Value in next row Sensitive      SENSITIVE<=1    * LIGHT GROWTH STAPHYLOCOCCUS AUREUS   Proteus penneri - MIC*    AMPICILLIN Value in next row Resistant      SENSITIVE<=1    CEFAZOLIN Value in next row Resistant      SENSITIVE<=1    CEFTRIAXONE Value in next row Sensitive      SENSITIVE<=1    CIPROFLOXACIN Value in next row Sensitive      SENSITIVE<=1    GENTAMICIN Value in next row Sensitive      SENSITIVE<=1    IMIPENEM Value in next row Sensitive      SENSITIVE<=1    NITROFURANTOIN Value in next row Resistant      SENSITIVE<=1    TRIMETH/SULFA Value in next row Sensitive      SENSITIVE<=1    PIP/TAZO Value in next row Sensitive      SENSITIVE<=4    * RARE GROWTH PROTEUS PENNERI   Enterococcus faecalis - MIC*    AMPICILLIN Value in next row Sensitive      SENSITIVE<=4    LINEZOLID Value in next row Sensitive      SENSITIVE<=4    * RARE GROWTH ENTEROCOCCUS FAECALIS  Wound culture     Status: None   Collection Time: 06/13/15  1:58 PM  Result Value Ref Range Status   Specimen Description LEG  Final   Special Requests Normal  Final   Gram Stain RARE WBC SEEN FEW GRAM NEGATIVE RODS   Final   Culture NO GROWTH 4 DAYS  Final    Report Status 06/17/2015 FINAL  Final    Coagulation Studies: No results for input(s): LABPROT, INR in the last 72 hours.  Urinalysis: No results for input(s): COLORURINE, LABSPEC, PHURINE, GLUCOSEU, HGBUR, BILIRUBINUR, KETONESUR, PROTEINUR, UROBILINOGEN, NITRITE, LEUKOCYTESUR in the last 72 hours.  Invalid input(s): APPERANCEUR    Imaging: Dg Chest 1 View  06/17/2015   CLINICAL DATA:  Patient with acute respiratory failure and community acquired pneumonia.  EXAM: CHEST  1 VIEW  COMPARISON:  Chest radiograph 06/15/2015  FINDINGS: ET tube terminates in the distal trachea. Left subclavian central venous catheter tip projects over the superior vena cava. Enteric tube courses inferior to the diaphragm. Multiple additional monitoring leads overlie the patient. Stable cardiomegaly. Low lung volumes. Diffuse bilateral interstitial pulmonary opacities. Small bilateral pleural effusions. No definite pneumothorax.  IMPRESSION: Stable support apparatus.  Cardiomegaly and mild interstitial edema.   Electronically Signed   By: Lovey Newcomer M.D.   On: 06/17/2015 09:15   Dg Abd 1 View  06/17/2015   CLINICAL DATA:  Abdominal distention.  Sepsis.  Respiratory failure.  EXAM: ABDOMEN - 1 VIEW  COMPARISON:  06/13/2015  FINDINGS: Gaseous distention is seen involving the nondependent portions of the colon, mainly the transverse colon, consistent with adynamic ileus. Cecum measures approximately 9.4 cm in diameter. No dilated small bowel loops seen. Catheter is noted within the bladder. Right femoral venous catheter also noted.  IMPRESSION: Moderate colonic ileus.   Electronically Signed   By: Earle Gell M.D.   On: 06/17/2015 08:40   US Abdomen Complete  06/16/2015   CLINICAL DATA:  Patient with liver and kidney disease. Respiratory failure.  EXAM: ULTRASOUND ABDOMEN COMPLETE  COMPARISON:  None.  FINDINGS: Gallbladder: Probable sludge within the gallbladder lumen. No gallbladder wall thickening. No pericholecystic  fluid. Unable to assess for sonographic Murphy's sign.  Common bile duct: Diameter: Approximately 3 mm  Liver: No focal lesion identified. Within normal limits in parenchymal echogenicity.  IVC: No abnormality visualized.  Pancreas: Not visualized due to overlying bowel gas and body habitus.  Spleen: Size and appearance within normal limits.  Right Kidney: Length: 14.2 cm. No hydronephrosis. There is a 7 cm cyst off the superior pole.  Left Kidney: Length: 12.4 cm. No hydronephrosis. There is a 2.2 cm hypoechoic lesion within the interpolar region of the left kidney, incompletely characterized.  Abdominal aorta: Not well visualized.  Other findings: None.  IMPRESSION: Possible sludge within the gallbladder lumen. No definite sonographic evidence to suggest acute cholecystitis.  No hydronephrosis.  Indeterminate hypoechoic mass within the interpolar region left kidney, potentially representing a complicated cyst. Solid mass is not excluded. Consider correlation with dedicated cross-sectional imaging when patient clinically able.   Electronically Signed   By: Lovey Newcomer M.D.   On: 06/16/2015 18:21     Medications:   . dextrose 5 % and 0.45% NaCl 75 mL/hr at 06/17/15 1044  . fentaNYL infusion INTRAVENOUS 400 mcg/hr (06/17/15 1300)  . norepinephrine    . pureflow 2,500 mL/hr at 06/17/15 1205   . amiodarone  400 mg Oral BID  . antiseptic oral rinse  7 mL Mouth Rinse QID  . budesonide (PULMICORT) nebulizer solution  0.5 mg Nebulization BID  . chlorhexidine gluconate  15 mL Mouth Rinse BID  . ciprofloxacin  400 mg Intravenous Q12H  . clonazePAM  0.5 mg Oral BID  . famotidine  20 mg Oral Q24H  . ipratropium-albuterol  3 mL Nebulization Q6H  . magnesium sulfate 1 - 4 g bolus IVPB  2 g Intravenous Once  . sodium phosphate  Dextrose 5% IVPB  20 mmol Intravenous Once   ammonium lactate, anticoagulant sodium citrate, midazolam, rocuronium  Assessment/ Plan:  64 y.o. male with a PMHx of congestive heart  failure ejection fraction 30-35%, prior pneumonia, lower extremity edema with cellulitis, atrial fibrillation who was admitted to [facility] on 06/10/2015 for evaluation of altered mental status and shortness of breath. Upon admission the patient underwent intubation with transition to a mechanical ventilation.  1. Acute renal failure/chronic kidney disease stage III with baseline creatinine 1.3/proteinuria. The patient most likely has acute renal failure secondary to altered cardiorenal hemodynamics. His ejection fraction is low at 30%. -Oliguria persists, continue CRRT at this time, UF target remains 125cc/hr.    2. Acute respiratory failure. Remains on the vent, fio2 remains 35%, continue volume removal to treat underlying heart failure/pulmonary edema.   3. Acute systolic heart failure. EF 30-35%. -UF achieved yesterday was 2.9 liters, net negative 550 cc, continue  CRRT with UF.    LOS: 7 Markale Birdsell 8/21/20161:55 PM

## 2015-06-18 ENCOUNTER — Inpatient Hospital Stay: Payer: Medicaid Other

## 2015-06-18 DIAGNOSIS — L03116 Cellulitis of left lower limb: Secondary | ICD-10-CM | POA: Diagnosis not present

## 2015-06-18 DIAGNOSIS — I481 Persistent atrial fibrillation: Secondary | ICD-10-CM | POA: Diagnosis not present

## 2015-06-18 DIAGNOSIS — N179 Acute kidney failure, unspecified: Secondary | ICD-10-CM | POA: Diagnosis not present

## 2015-06-18 DIAGNOSIS — J9601 Acute respiratory failure with hypoxia: Secondary | ICD-10-CM | POA: Diagnosis not present

## 2015-06-18 LAB — CBC
HEMATOCRIT: 33.4 % — AB (ref 40.0–52.0)
HEMOGLOBIN: 10.9 g/dL — AB (ref 13.0–18.0)
MCH: 33.6 pg (ref 26.0–34.0)
MCHC: 32.5 g/dL (ref 32.0–36.0)
MCV: 103.3 fL — ABNORMAL HIGH (ref 80.0–100.0)
Platelets: 34 10*3/uL — ABNORMAL LOW (ref 150–440)
RBC: 3.23 MIL/uL — ABNORMAL LOW (ref 4.40–5.90)
RDW: 17.2 % — ABNORMAL HIGH (ref 11.5–14.5)
WBC: 5.1 10*3/uL (ref 3.8–10.6)

## 2015-06-18 LAB — BASIC METABOLIC PANEL
ANION GAP: 5 (ref 5–15)
BUN: 15 mg/dL (ref 6–20)
CHLORIDE: 105 mmol/L (ref 101–111)
CO2: 28 mmol/L (ref 22–32)
Calcium: 8.2 mg/dL — ABNORMAL LOW (ref 8.9–10.3)
Creatinine, Ser: 1.2 mg/dL (ref 0.61–1.24)
GFR calc non Af Amer: >60 mL/min (ref >60)
Glucose, Bld: 96 mg/dL (ref 65–99)
POTASSIUM: 4.5 mmol/L (ref 3.5–5.1)
Sodium: 138 mmol/L (ref 135–145)

## 2015-06-18 LAB — GLUCOSE, CAPILLARY
GLUCOSE-CAPILLARY: 111 mg/dL — AB (ref 65–99)
GLUCOSE-CAPILLARY: 122 mg/dL — AB (ref 65–99)
GLUCOSE-CAPILLARY: 95 mg/dL (ref 65–99)
Glucose-Capillary: 103 mg/dL — ABNORMAL HIGH (ref 65–99)
Glucose-Capillary: 108 mg/dL — ABNORMAL HIGH (ref 65–99)
Glucose-Capillary: 116 mg/dL — ABNORMAL HIGH (ref 65–99)
Glucose-Capillary: 129 mg/dL — ABNORMAL HIGH (ref 65–99)
Glucose-Capillary: 81 mg/dL (ref 65–99)
Glucose-Capillary: 89 mg/dL (ref 65–99)

## 2015-06-18 LAB — RENAL FUNCTION PANEL
ALBUMIN: 2.7 g/dL — AB (ref 3.5–5.0)
ALBUMIN: 2.4 g/dL — AB (ref 3.5–5.0)
ANION GAP: 4 — AB (ref 5–15)
Albumin: 2.3 g/dL — ABNORMAL LOW (ref 3.5–5.0)
Anion gap: 5 (ref 5–15)
Anion gap: 7 (ref 5–15)
BUN: 14 mg/dL (ref 6–20)
BUN: 15 mg/dL (ref 6–20)
BUN: 14 mg/dL (ref 6–20)
CHLORIDE: 102 mmol/L (ref 101–111)
CHLORIDE: 104 mmol/L (ref 101–111)
CHLORIDE: 105 mmol/L (ref 101–111)
CO2: 26 mmol/L (ref 22–32)
CO2: 28 mmol/L (ref 22–32)
CO2: 28 mmol/L (ref 22–32)
Calcium: 8.1 mg/dL — ABNORMAL LOW (ref 8.9–10.3)
Calcium: 8.3 mg/dL — ABNORMAL LOW (ref 8.9–10.3)
Calcium: 8.3 mg/dL — ABNORMAL LOW (ref 8.9–10.3)
Creatinine, Ser: 1.18 mg/dL (ref 0.61–1.24)
Creatinine, Ser: 1.22 mg/dL (ref 0.61–1.24)
Creatinine, Ser: 1.23 mg/dL (ref 0.61–1.24)
GFR calc Af Amer: >60 mL/min (ref >60)
GFR calc Af Amer: >60 mL/min (ref >60)
GFR calc non Af Amer: >60 mL/min (ref >60)
GLUCOSE: 139 mg/dL — AB (ref 65–99)
Glucose, Bld: 101 mg/dL — ABNORMAL HIGH (ref 65–99)
Glucose, Bld: 93 mg/dL (ref 65–99)
PHOSPHORUS: 2.2 mg/dL — AB (ref 2.5–4.6)
PHOSPHORUS: 2.4 mg/dL — AB (ref 2.5–4.6)
POTASSIUM: 4.5 mmol/L (ref 3.5–5.1)
POTASSIUM: 4.4 mmol/L (ref 3.5–5.1)
POTASSIUM: 4.5 mmol/L (ref 3.5–5.1)
Phosphorus: 2.2 mg/dL — ABNORMAL LOW (ref 2.5–4.6)
Sodium: 135 mmol/L (ref 135–145)
Sodium: 136 mmol/L (ref 135–145)
Sodium: 138 mmol/L (ref 135–145)

## 2015-06-18 LAB — BLOOD GAS, ARTERIAL
ACID-BASE EXCESS: 2.7 mmol/L (ref 0.0–3.0)
BICARBONATE: 27.9 meq/L (ref 21.0–28.0)
FIO2: 0.35
MECHVT: 500 mL
Mechanical Rate: 24
O2 SAT: 95.2 %
PATIENT TEMPERATURE: 37
PCO2 ART: 44 mmHg (ref 32.0–48.0)
PEEP: 5 cmH2O
PH ART: 7.41 (ref 7.350–7.450)
pO2, Arterial: 76 mmHg — ABNORMAL LOW (ref 83.0–108.0)

## 2015-06-18 LAB — AMMONIA: Ammonia: 24 umol/L (ref 9–35)

## 2015-06-18 LAB — MAGNESIUM
MAGNESIUM: 1.8 mg/dL (ref 1.7–2.4)
Magnesium: 1.8 mg/dL (ref 1.7–2.4)
Magnesium: 1.7 mg/dL (ref 1.7–2.4)

## 2015-06-18 LAB — AST: AST: 30 U/L (ref 15–41)

## 2015-06-18 LAB — FOLATE: FOLATE: 5.6 ng/mL — AB (ref >5.9)

## 2015-06-18 LAB — ALT: ALT: 23 U/L (ref 17–63)

## 2015-06-18 MED ORDER — ALBUMIN HUMAN 25 % IV SOLN
12.5000 g | Freq: Four times a day (QID) | INTRAVENOUS | Status: DC
  Administered 2015-06-18 – 2015-06-22 (×15): 12.5 g via INTRAVENOUS
  Filled 2015-06-18 (×23): qty 50

## 2015-06-18 MED ORDER — FENTANYL CITRATE (PF) 100 MCG/2ML IJ SOLN
100.0000 ug | INTRAMUSCULAR | Status: DC | PRN
  Administered 2015-06-19 – 2015-06-20 (×5): 100 ug via INTRAVENOUS
  Filled 2015-06-18 (×5): qty 2

## 2015-06-18 MED ORDER — FENTANYL 2500MCG IN NS 250ML (10MCG/ML) PREMIX INFUSION: 10.0000 ug/h | INTRAVENOUS | Status: DC

## 2015-06-18 MED ORDER — DEXTROSE 10 % IV SOLN
INTRAVENOUS | Status: DC
  Administered 2015-06-18: 09:00:00 via INTRAVENOUS

## 2015-06-18 MED ORDER — VITAL HIGH PROTEIN PO LIQD
1000.0000 mL | ORAL | Status: DC
  Administered 2015-06-18 – 2015-06-19 (×2): 1000 mL

## 2015-06-18 MED ORDER — PRO-STAT SUGAR FREE PO LIQD
30.0000 mL | Freq: Two times a day (BID) | ORAL | Status: DC
  Administered 2015-06-18 – 2015-06-19 (×2): 30 mL via ORAL

## 2015-06-18 MED ORDER — DEXMEDETOMIDINE HCL IN NACL 400 MCG/100ML IV SOLN
0.4000 ug/kg/h | INTRAVENOUS | Status: DC
  Administered 2015-06-18: 0.8 ug/kg/h via INTRAVENOUS
  Administered 2015-06-18 (×2): 0.6 ug/kg/h via INTRAVENOUS
  Administered 2015-06-19: 1.6 ug/kg/h via INTRAVENOUS
  Administered 2015-06-19: 1.2 ug/kg/h via INTRAVENOUS
  Administered 2015-06-19: 1.6 ug/kg/h via INTRAVENOUS
  Administered 2015-06-19: 0.6 ug/kg/h via INTRAVENOUS
  Administered 2015-06-19: 1.6 ug/kg/h via INTRAVENOUS
  Administered 2015-06-19: 1.4 ug/kg/h via INTRAVENOUS
  Administered 2015-06-19: 1.5 ug/kg/h via INTRAVENOUS
  Administered 2015-06-19: 1.2 ug/kg/h via INTRAVENOUS
  Administered 2015-06-19: 0.6 ug/kg/h via INTRAVENOUS
  Administered 2015-06-20: 0.986 ug/kg/h via INTRAVENOUS
  Administered 2015-06-20: 1 ug/kg/h via INTRAVENOUS
  Administered 2015-06-20: 1.6 ug/kg/h via INTRAVENOUS
  Administered 2015-06-20 (×2): 1 ug/kg/h via INTRAVENOUS
  Administered 2015-06-20: 1.601 ug/kg/h via INTRAVENOUS
  Administered 2015-06-20: 1 ug/kg/h via INTRAVENOUS
  Administered 2015-06-20: 1.6 ug/kg/h via INTRAVENOUS
  Administered 2015-06-21: 1 ug/kg/h via INTRAVENOUS
  Administered 2015-06-21 (×2): 0.8 ug/kg/h via INTRAVENOUS
  Filled 2015-06-18 (×16): qty 100
  Filled 2015-06-18: qty 50
  Filled 2015-06-18 (×6): qty 100

## 2015-06-18 MED ORDER — ALBUMIN HUMAN 25 % IV SOLN
12.5000 g | Freq: Four times a day (QID) | INTRAVENOUS | Status: DC
  Administered 2015-06-18: 12.5 g via INTRAVENOUS
  Filled 2015-06-18 (×6): qty 50

## 2015-06-18 NOTE — Progress Notes (Signed)
[facility] Progess Note     ASSESSMENAT/PLAN   64 yo white male admitted to ICU for acute  hypercapnic and hypoxic resp failure from acute CHF exacerbation with acute liver dysfunction/liver failure with acute renal failure with acute encephalopathy with mixed resp and metabolic acidosis with recurrent resp failure. Pt grew MSSA in sputum, suspect pneumonia.  Pt failed extubation and reintubated. Now becoming more awake  PULMONARY 1.Respiratory Failure-patient reintubated  due to worsening cardio-renal syndrome - sedation turned off, patient placed on SBT, but tachypnea ensued, currently placed on PSV, will start back PRVC at night.   -MSSA in sputum. Possible pneumonia on antibiotics. -continue Bronchodilator Therapy -Wean Fio2 and PEEP as tolerated -his respiratory status is tenuous giving his other comorbities, his risk of reintubation is high.  - cont with even I/O status -check abg  CARDIOVASCULAR ACUTE CHF, chronic atrial fibrillation with RVR, now controlled. Continue amiodarone via tube, and restart IV if his heart rate goes back up. -History of systolic congestive heart failure with an ejection fraction of 35% -intubated -continue vent support  RENAL Renal Failure-most likely due to ATN, currently on CRRT -follow chem 7 -follow UO -continue Foley Catheter-assess need -vasc cath placed for CRRT-nephrology consulted -placed on intermittent albumin by nephro   GASTROINTESTINAL GI Prophylaxic -OG placed-on TF's -Patient now noted to have ileus on abdominal x-ray tube feeds have been turned off. - U/S of RUL with GB sludge but no fluid noted, no leukocytosis - KUB with no acute findings today - hx of ? Liver dysfunction, check ammonia  HEMATOLOGIC Follow h/h  INFECTIOUS ?cellulitis of LE-wound culture positive for multiple organisms including E faecalis MSSA, and Proteus. -on unasyn -Sputum culture positive for MSSA. Continue unasyn  for MSSA pneumonia, ID following.   ENDOCRINE - ICU hypoglycemic\Hyperglycemia protocol   NEUROLOGIC - intubated and sedated - minimal sedation to achieve a RASS goal: -1     -Patient was discussed with palliative care and with the critical care RN. Continue current vent support. Patient will attempt sedation vacation again, vent wean.   -DVT prophylaxis, on heparin -Gi Prophylaxis, on tube feeds, Famotidine.  ----------------------------------------   Name: VRAJ DENARDO MRN: [id number] DOB: 12-29-1950    ADMISSION DATE:  06/10/2015  SUBJECTIVE:   Pt currently on the ventilator, following simple commands, still with AMS (somnolence).   Review of Systems:  Pt currently on the ventilator, can not provide history or review of systems.    VITAL SIGNS: Temp:  [97.2 F (36.2 C)-97.9 F (36.6 C)] 97.2 F (36.2 C) (08/22 1300) Pulse Rate:  [67-89] 84 (08/22 1300) Resp:  [21-25] 24 (08/22 1300) BP: (84-113)/(55-76) 106/65 mmHg (08/22 1300) SpO2:  [95 %-100 %] 100 % (08/22 1300) FiO2 (%):  [35 %] 35 % (08/22 1334) Weight:  [273 lb 13 oz (124.2 kg)] 273 lb 13 oz (124.2 kg) (08/22 0500) HEMODYNAMICS:   VENTILATOR SETTINGS: Vent Mode:  [-] PSV FiO2 (%):  [35 %] 35 % Set Rate:  [24 bmp] 24 bmp Vt Set:  [500 mL] 500 mL PEEP:  [5 cmH20] 5 cmH20 Pressure Support:  [18 cmH20] 18 cmH20 Plateau Pressure:  [27 cmH20] 27 cmH20 INTAKE / OUTPUT:  Intake/Output Summary (Last 24 hours) at 06/18/15 1340 Last data filed at 06/18/15 1300  Gross per 24 hour  Intake 2194.5 ml  Output   2576 ml  Net -381.5 ml    PHYSICAL EXAMINATION: Physical Examination:   VS: BP 106/65 mmHg  Pulse 84  Temp(Src) 97.2 F (36.2 C) (Core (Comment))  Resp 24  Ht 5\' 8"  (1.727 m)  Wt 273 lb 13 oz (124.2 kg)  BMI 41.64 kg/m2  SpO2 100%  General Appearance: No distress  Neuro:without focal findings, mental status normal. HEENT: PERRLA, EOM intact. Pulmonary: normal breath sounds     CardiovascularNormal S1,S2.  No m/r/g.   Abdomen: Benign, Soft, non-tender. Renal:  No costovertebral tenderness  GU:  Not performed at this time. Endocrine: No evident thyromegaly. Skin:   Warm , anterior LE B/L skin wounds (clean based, no purulent drainage).  Extremities: normal, no cyanosis, clubbing.   LABS:  CBC  Recent Labs Lab 06/16/15 0006 06/17/15 0423 06/18/15 0408  WBC 5.4 5.3 5.1  HGB 11.0* 10.8* 10.9*  HCT 34.3* 33.9* 33.4*  PLT 36* 33* 34*   Coag's No results for input(s): APTT, INR in the last 168 hours. BMET  Recent Labs Lab 06/18/15 0010 06/18/15 0408 06/18/15 0814  NA 136 138 138  K 4.4 4.5 4.5  CL 104 105 105  CO2 28 28 28   BUN 15 15 14   CREATININE 1.18 1.20 1.23  GLUCOSE 101* 96 93   Electrolytes  Recent Labs Lab 06/17/15 1555 06/17/15 1952 06/18/15 0010 06/18/15 0408 06/18/15 0814  CALCIUM 8.2* 8.3* 8.1* 8.2* 8.3*  MG 2.3  --  1.8  --  1.8  PHOS 2.3* 2.5 2.4*  --  2.2*   Sepsis Markers No results for input(s): LATICACIDVEN, PROCALCITON, O2SATVEN in the last 168 hours. ABG  Recent Labs Lab 06/14/15 0915 06/17/15 0408 06/18/15 0500  PHART 7.37 7.38 7.41  PCO2ART 44 48 44  PO2ART 81* 75* 76*   Liver Enzymes  Recent Labs Lab 06/11/15 1519  06/17/15 1952 06/18/15 0010 06/18/15 0814  AST 108*  --   --   --   --   ALT 112*  --   --   --   --   ALKPHOS 68  --   --   --   --   BILITOT 5.4*  --   --   --   --   ALBUMIN 2.8*  < > 2.3* 2.4* 2.3*  < > = values in this interval not displayed. Cardiac Enzymes No results for input(s): TROPONINI, PROBNP in the last 168 hours. Glucose  Recent Labs Lab 06/17/15 1937 06/17/15 2330 06/18/15 0013 06/18/15 0336 06/18/15 0737 06/18/15 1153  GLUCAP 75 74 111* 95 81 89    Imaging I did review the chest x-ray and abdominal x-ray films as well as the reports. Chest x-ray shows continued pulmonary edema and atelectasis. The abdominal x-ray shows evidence of ileus. Dg Chest 1  View  06/18/2015   CLINICAL DATA:  Acute respiratory failure, community-acquired pneumonia, intubated patient.  EXAM: CHEST  1 VIEW  COMPARISON:  Portable chest x-ray of June 17, 2015  FINDINGS: The lungs are adequately inflated. The interstitial markings remain increased with areas of confluence noted particularly on the left. The retrocardiac region remains dense and the left hemidiaphragm remains obscured. The cardiac silhouette is enlarged. The endotracheal tube tip lies approximately 1.7 cm above the carina. The esophagogastric tube tip projects below the inferior margin of the image. The left subclavian venous catheter tip projects over the midportion of the SVC.  IMPRESSION: Cardiomegaly with mild interstitial prominence bilaterally. Alveolar edema or pneumonia on the left with retrocardiac atelectasis. The support apparatus is in stable position.   Electronically Signed   By: David  Martinique M.D.   On:  06/18/2015 07:39   Dg Abd 1 View  06/18/2015   CLINICAL DATA:  Followup abdominal distension  EXAM: ABDOMEN - 1 VIEW  COMPARISON:  06/17/2015  FINDINGS: A right femoral dual lumen catheter is again seen and stable. Scattered large and small bowel gas is noted. No obstructive changes are seen. Bony structures show degenerative change of the lumbar spine.  IMPRESSION: No acute abnormality noted.   Electronically Signed   By: Inez Catalina M.D.   On: 06/18/2015 11:57     I have personally obtained a history, examined the patient, evaluated laboratory and imaging results, formulated the assessment and plan and placed orders. CRITICAL CARE: The patient is critically ill with multiple organ systems failure and requires high complexity decision making for assessment and support, frequent evaluation and titration of therapies, application of advanced monitoring technologies and extensive interpretation of multiple databases. Critical Care Time devoted to patient care services described in this note is 45  minutes.    Vilinda Boehringer, MD [facility] Pager [phone] (Please enter 7-digits)

## 2015-06-18 NOTE — Progress Notes (Signed)
Patient remains on CRRT and on ventilator per MD note 06/18/15. CRRT not able to be managed in SNF's. Clinical Social Worker (CSW) will continue to follow and assist as needed.   Blima Rich, [place] [phone]

## 2015-06-18 NOTE — Progress Notes (Signed)
   06/18/15 1011  Vent Select  Invasive or Noninvasive Invasive  Adult Vent Y  Adult Ventilator Settings  Vent Type Servo i  Humidity HME  Vent Mode PSV  FiO2 (%) 35 %  Pressure Support 18 cmH20  PEEP 5 cmH20  Adult Ventilator Measurements  Peak Airway Pressure 24 L/min  Mean Airway Pressure 85 cmH20  Resp Rate Spontaneous 19 br/min  Resp Rate Total 19 br/min  Exhaled Vt 396 mL  Measured Ve 8.4 mL  HOB> 30 Degrees Y  Adult Ventilator Alarms  Alarms On Y  Placed patient on PSV 18, peep 5, fi02 35% per MD request.  Patient is tolerating well at this time.  RN aware.

## 2015-06-18 NOTE — Progress Notes (Signed)
Placed patient on PSV 18, peep 5, fi02 35%.  Patient is tolerating well at this time.  RN is in the room and aware.

## 2015-06-18 NOTE — Care Management Note (Signed)
Case Management Note  Patient Details  Name: Aaron Harrison MRN: [id number] Date of Birth: [birth year]  Subjective/Objective:  Spontaneous Vent trial to be attempted today.                Action/Plan:   Expected Discharge Date:                  Expected Discharge Plan:     In-House Referral:     Discharge planning Services     Post Acute Care Choice:    Choice offered to:     DME Arranged:    DME Agency:     HH Arranged:    [place] Agency:     Status of Service:     Medicare Important Message Given:    Date Medicare IM Given:    Medicare IM give by:    Date Additional Medicare IM Given:    Additional Medicare Important Message give by:     If discussed at [place] of Stay Meetings, dates discussed:    Additional Comments:  Jolly Mango, RN 06/18/2015, 2:16 PM

## 2015-06-18 NOTE — Progress Notes (Signed)
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.  Several scout leaders and scouts visited pt. this evening.  Mr. Gardiner Ramus wanted to leave his card for the pt's family with address and phone number.  It is in the chart.  Mr. Everlena Cooper [place]  [phone]

## 2015-06-18 NOTE — Progress Notes (Signed)
   06/18/15 1023  Vent Select  Invasive or Noninvasive Invasive  Adult Vent Y  Adult Ventilator Settings  Vent Type Servo i  Humidity HME  Vent Mode PRVC  Vt Set 500 mL  Set Rate 24 bmp  FiO2 (%) 35 %  I Time 0.9 Sec(s)  PEEP 5 cmH20  Adult Ventilator Measurements  Peak Airway Pressure 28 L/min  Exhaled Vt 503 mL  Measured Ve 12.9 mL  Adult Ventilator Alarms  Alarms On Y  Returned patient to previous settings due to desaturation

## 2015-06-18 NOTE — Progress Notes (Signed)
Nutrition Follow-up   INTERVENTION:   Coordination of Care: discussed nutritional poc during ICU rounds, plan for repeat abdominal xray. If ileus improving or resolved, recommend trial of reinitiation of TF. Continue to assess  NUTRITION DIAGNOSIS:   Inadequate oral intake related to acute illness as evidenced by NPO status.  GOAL:   Patient will meet greater than or equal to 90% of their needs   MONITOR:    (Energy Intake, EN, Digestive System, Electrolyte/Renal Profile, Glucose Profile)   ASSESSMENT:    Pt remains on vent, on CRRT increasing UR to 150 ml/hr, continues with abdominal distention and edema   Diet Order:  Diet NPO time specified   EN: TF remains on hold  Digestive System: abdomen remains distended/taut, flexiseal in place for stool collection and also decompression, BS hypoactive, OG with minimal output. Noted no issues with vomitting, high residuals prior to TF being stopped  Skin:  Reviewed, no issues  Last BM:  8/18   Meds: D10 at 40 ml/hr (326 kcals)  Electrolyte/Renal Profile:  Recent Labs Lab 06/17/15 1555 06/17/15 1952 06/18/15 0010 06/18/15 0408 06/18/15 0814  NA 137 138 136 138 138  K 4.5 4.5 4.4 4.5 4.5  CL 105 106 104 105 105  CO2 27 28 28 28 28   BUN 16 15 15 15 14   CREATININE 1.30* 1.25* 1.18 1.20 1.23  CALCIUM 8.2* 8.3* 8.1* 8.2* 8.3*  MG 2.3  --  1.8  --  1.8  PHOS 2.3* 2.5 2.4*  --  2.2*  GLUCOSE 107* 100* 101* 96 93   Protein Profile:  Recent Labs Lab 06/17/15 1952 06/18/15 0010 06/18/15 0814  ALBUMIN 2.3* 2.4* 2.3*   Glucose Profile:  Recent Labs  06/18/15 0336 06/18/15 0737 06/18/15 1153  GLUCAP 95 81 89    Height:   Ht Readings from Last 1 Encounters:  06/13/15 5\' 8"  (1.727 m)    Weight:   Wt Readings from Last 1 Encounters:  06/18/15 273 lb 13 oz (124.2 kg)    BMI:  Body mass index is 41.64 kg/(m^2).  Estimated Nutritional Needs:   Kcal:  1751-0258 kcals (11-14 kcals/kg)   Protein:  140-175  g (2.0-2.5 g/kg)   Fluid:  1750-2100 mL (25-30 ml/kg)      HIGH Care Level  Kerman Passey MS, RD, LDN [phone] Pager

## 2015-06-18 NOTE — Progress Notes (Signed)
Subjective:  Pt remains on CRRT. Remains oliguric. UF 125 cc/hr Abdomen distended Continues to have massive edema   Objective:  Vital signs in last 24 hours:  Temp:  [97.3 F (36.3 C)-97.9 F (36.6 C)] 97.5 F (36.4 C) (08/22 0830) Pulse Rate:  [67-89] 77 (08/22 0830) Resp:  [21-25] 21 (08/22 0830) BP: (84-103)/(55-78) 94/65 mmHg (08/22 0830) SpO2:  [95 %-100 %] 96 % (08/22 0830) FiO2 (%):  [35 %] 35 % (08/22 0830) Weight:  [124.2 kg (273 lb 13 oz)] 124.2 kg (273 lb 13 oz) (08/22 0500)  Weight change: 1.2 kg (2 lb 10.3 oz) Filed Weights   06/16/15 0500 06/17/15 0500 06/18/15 0500  Weight: 128.2 kg (282 lb 10.1 oz) 123 kg (271 lb 2.7 oz) 124.2 kg (273 lb 13 oz)    Intake/Output: I/O last 3 completed shifts: In: 4044.5 [I.V.:2937.5; Other:10; NG/GT:315; IV Piggyback:782] Out: 6568 [Urine:50; Other:4015]   Intake/Output this shift:  Total I/O In: 75 [I.V.:75] Out: -   Physical Exam: General: Critically ill appearing  Head: Normocephalic, atraumatic. ETT in place  Eyes: Eyes closed  Neck: Supple, trachea midline  Lungs:  Bilateral rhonchi; vent assisted  Heart: Irregular S1S2 no rubs  Abdomen:  nontender, mild distension, tense  Extremities:  ++ generalized  edema  Neurologic: Intubated, arousable however  Skin: ulcertaion bilateral lower extremeties  Access: Femoral dialysis catheter    Basic Metabolic Panel:  Recent Labs Lab 06/17/15 0423 06/17/15 0727 06/17/15 1151 06/17/15 1555 06/17/15 1952 06/18/15 0010 06/18/15 0408  NA 139 139 137 137 138 136 138  K 4.4 4.6 4.3 4.5 4.5 4.4 4.5  CL 107 106 106 105 106 104 105  CO2 29 29 27 27 28 28 28   GLUCOSE 94 103* 112* 107* 100* 101* 96  BUN 18 18 17 16 15 15 15   CREATININE 1.23 1.25* 1.27* 1.30* 1.25* 1.18 1.20  CALCIUM 8.2* 8.3* 7.9* 8.2* 8.3* 8.1* 8.2*  MG 1.7 1.7 1.6* 2.3  --  1.8  --   PHOS 2.2* 2.4* 2.0* 2.3* 2.5 2.4*  --     Liver Function Tests:  Recent Labs Lab 06/11/15 1519   06/17/15 0727 06/17/15 1151 06/17/15 1555 06/17/15 1952 06/18/15 0010  AST 108*  --   --   --   --   --   --   ALT 112*  --   --   --   --   --   --   ALKPHOS 68  --   --   --   --   --   --   BILITOT 5.4*  --   --   --   --   --   --   PROT 6.0*  --   --   --   --   --   --   ALBUMIN 2.8*  < > 2.3* 2.1* 2.4* 2.3* 2.4*  < > = values in this interval not displayed. No results for input(s): LIPASE, AMYLASE in the last 168 hours. No results for input(s): AMMONIA in the last 168 hours.  CBC:  Recent Labs Lab 06/13/15 0143 06/14/15 0440 06/15/15 1609 06/16/15 0006 06/17/15 0423 06/18/15 0408  WBC 8.4 5.9 4.2 5.4 5.3 5.1  NEUTROABS 7.7*  --   --   --   --   --   HGB 12.2* 10.7* 10.6* 11.0* 10.8* 10.9*  HCT 38.5* 32.9* 34.5* 34.3* 33.9* 33.4*  MCV 107.8* 103.3* 106.7* 104.1* 103.0* 103.3*  PLT 85* 57* 46*  36* 33* 34*    Cardiac Enzymes: No results for input(s): CKTOTAL, CKMB, CKMBINDEX, TROPONINI in the last 168 hours.  BNP: Invalid input(s): POCBNP  CBG:  Recent Labs Lab 06/17/15 1937 06/17/15 2330 06/18/15 0013 06/18/15 0336 06/18/15 0737  GLUCAP 75 74 111* 95 81    Microbiology: Results for orders placed or performed during the hospital encounter of 06/10/15  Blood Culture (routine x 2)     Status: None   Collection Time: 06/10/15  2:35 PM  Result Value Ref Range Status   Specimen Description BLOOD RIGHT WRIST  Final   Special Requests BOTTLES DRAWN AEROBIC AND ANAEROBIC  [place]  Final   Culture NO GROWTH 5 DAYS  Final   Report Status 06/15/2015 FINAL  Final  Blood Culture (routine x 2)     Status: None   Collection Time: 06/10/15  2:40 PM  Result Value Ref Range Status   Specimen Description BLOOD LEFT ASSIST CONTROL  Final   Special Requests   Final    BOTTLES DRAWN AEROBIC AND ANAEROBIC  AER 6CC ANA 4CC   Culture NO GROWTH 5 DAYS  Final   Report Status 06/15/2015 FINAL  Final  Urine culture     Status: None   Collection Time: 06/10/15  4:42 PM   Result Value Ref Range Status   Specimen Description URINE, RANDOM  Final   Special Requests NONE  Final   Culture 4,000 COLONIES/mL INSIGNIFICANT GROWTH  Final   Report Status 06/12/2015 FINAL  Final  MRSA PCR Screening     Status: None   Collection Time: 06/10/15  6:27 PM  Result Value Ref Range Status   MRSA by PCR NEGATIVE NEGATIVE Final    Comment:        The GeneXpert MRSA Assay (FDA approved for NASAL specimens only), is one component of a comprehensive MRSA colonization surveillance program. It is not intended to diagnose MRSA infection nor to guide or monitor treatment for MRSA infections.   Culture, sputum-assessment     Status: None   Collection Time: 06/11/15  2:50 PM  Result Value Ref Range Status   Specimen Description SPUTUM  Final   Special Requests NONE  Final   Sputum evaluation THIS SPECIMEN IS ACCEPTABLE FOR SPUTUM CULTURE  Final   Report Status 06/13/2015 FINAL  Final  Culture, respiratory (NON-Expectorated)     Status: None   Collection Time: 06/11/15  2:50 PM  Result Value Ref Range Status   Specimen Description SPUTUM  Final   Special Requests NONE Reflexed from [id number]  Final   Gram Stain   Final    MODERATE WBC SEEN RARE GRAM POSITIVE COCCI GOOD SPECIMEN - 80-90% WBCS    Culture MODERATE GROWTH STAPHYLOCOCCUS AUREUS  Final   Report Status 06/14/2015 FINAL  Final   Organism ID, Bacteria STAPHYLOCOCCUS AUREUS  Final      Susceptibility   Staphylococcus aureus - MIC*    CIPROFLOXACIN <=0.5 SENSITIVE Sensitive     GENTAMICIN <=0.5 SENSITIVE Sensitive     OXACILLIN 0.5 SENSITIVE Sensitive     TRIMETH/SULFA <=10 SENSITIVE Sensitive     CEFOXITIN SCREEN NEGATIVE Sensitive     Inducible Clindamycin NEGATIVE Sensitive     TETRACYCLINE Value in next row Sensitive      SENSITIVE<=1    * MODERATE GROWTH STAPHYLOCOCCUS AUREUS  Wound culture     Status: None   Collection Time: 06/13/15  1:56 PM  Result Value Ref Range Status   Specimen Description  LEG  Final   Special Requests Normal  Final   Gram Stain   Final    FEW WBC SEEN MODERATE GRAM NEGATIVE RODS FEW GRAM POSITIVE COCCI    Culture   Final    LIGHT GROWTH STAPHYLOCOCCUS AUREUS RARE GROWTH PROTEUS PENNERI RARE GROWTH ENTEROCOCCUS FAECALIS    Report Status 06/17/2015 FINAL  Final   Organism ID, Bacteria STAPHYLOCOCCUS AUREUS  Final   Organism ID, Bacteria PROTEUS PENNERI  Final   Organism ID, Bacteria ENTEROCOCCUS FAECALIS  Final      Susceptibility   Staphylococcus aureus - MIC*    CIPROFLOXACIN <=0.5 SENSITIVE Sensitive     GENTAMICIN <=0.5 SENSITIVE Sensitive     OXACILLIN 0.5 SENSITIVE Sensitive     TRIMETH/SULFA <=10 SENSITIVE Sensitive     CEFOXITIN SCREEN NEGATIVE Sensitive     Inducible Clindamycin NEGATIVE Sensitive     TETRACYCLINE Value in next row Sensitive      SENSITIVE<=1    * LIGHT GROWTH STAPHYLOCOCCUS AUREUS   Proteus penneri - MIC*    AMPICILLIN Value in next row Resistant      SENSITIVE<=1    CEFAZOLIN Value in next row Resistant      SENSITIVE<=1    CEFTRIAXONE Value in next row Sensitive      SENSITIVE<=1    CIPROFLOXACIN Value in next row Sensitive      SENSITIVE<=1    GENTAMICIN Value in next row Sensitive      SENSITIVE<=1    IMIPENEM Value in next row Sensitive      SENSITIVE<=1    NITROFURANTOIN Value in next row Resistant      SENSITIVE<=1    TRIMETH/SULFA Value in next row Sensitive      SENSITIVE<=1    PIP/TAZO Value in next row Sensitive      SENSITIVE<=4    * RARE GROWTH PROTEUS PENNERI   Enterococcus faecalis - MIC*    AMPICILLIN Value in next row Sensitive      SENSITIVE<=4    LINEZOLID Value in next row Sensitive      SENSITIVE<=4    * RARE GROWTH ENTEROCOCCUS FAECALIS  Wound culture     Status: None   Collection Time: 06/13/15  1:58 PM  Result Value Ref Range Status   Specimen Description LEG  Final   Special Requests Normal  Final   Gram Stain RARE WBC SEEN FEW GRAM NEGATIVE RODS   Final   Culture NO GROWTH  4 DAYS  Final   Report Status 06/17/2015 FINAL  Final    Coagulation Studies: No results for input(s): LABPROT, INR in the last 72 hours.  Urinalysis: No results for input(s): COLORURINE, LABSPEC, PHURINE, GLUCOSEU, HGBUR, BILIRUBINUR, KETONESUR, PROTEINUR, UROBILINOGEN, NITRITE, LEUKOCYTESUR in the last 72 hours.  Invalid input(s): APPERANCEUR    Imaging: Dg Chest 1 View  06/18/2015   CLINICAL DATA:  Acute respiratory failure, community-acquired pneumonia, intubated patient.  EXAM: CHEST  1 VIEW  COMPARISON:  Portable chest x-ray of June 17, 2015  FINDINGS: The lungs are adequately inflated. The interstitial markings remain increased with areas of confluence noted particularly on the left. The retrocardiac region remains dense and the left hemidiaphragm remains obscured. The cardiac silhouette is enlarged. The endotracheal tube tip lies approximately 1.7 cm above the carina. The esophagogastric tube tip projects below the inferior margin of the image. The left subclavian venous catheter tip projects over the midportion of the SVC.  IMPRESSION: Cardiomegaly with mild interstitial prominence bilaterally. Alveolar edema or pneumonia on the left  with retrocardiac atelectasis. The support apparatus is in stable position.   Electronically Signed   By: David  Martinique M.D.   On: 06/18/2015 07:39   Dg Chest 1 View  06/17/2015   CLINICAL DATA:  Patient with acute respiratory failure and community acquired pneumonia.  EXAM: CHEST  1 VIEW  COMPARISON:  Chest radiograph 06/15/2015  FINDINGS: ET tube terminates in the distal trachea. Left subclavian central venous catheter tip projects over the superior vena cava. Enteric tube courses inferior to the diaphragm. Multiple additional monitoring leads overlie the patient. Stable cardiomegaly. Low lung volumes. Diffuse bilateral interstitial pulmonary opacities. Small bilateral pleural effusions. No definite pneumothorax.  IMPRESSION: Stable support apparatus.   Cardiomegaly and mild interstitial edema.   Electronically Signed   By: Lovey Newcomer M.D.   On: 06/17/2015 09:15   Dg Abd 1 View  06/17/2015   CLINICAL DATA:  Abdominal distention.  Sepsis.  Respiratory failure.  EXAM: ABDOMEN - 1 VIEW  COMPARISON:  06/13/2015  FINDINGS: Gaseous distention is seen involving the nondependent portions of the colon, mainly the transverse colon, consistent with adynamic ileus. Cecum measures approximately 9.4 cm in diameter. No dilated small bowel loops seen. Catheter is noted within the bladder. Right femoral venous catheter also noted.  IMPRESSION: Moderate colonic ileus.   Electronically Signed   By: Earle Gell M.D.   On: 06/17/2015 08:40   US Abdomen Complete  06/16/2015   CLINICAL DATA:  Patient with liver and kidney disease. Respiratory failure.  EXAM: ULTRASOUND ABDOMEN COMPLETE  COMPARISON:  None.  FINDINGS: Gallbladder: Probable sludge within the gallbladder lumen. No gallbladder wall thickening. No pericholecystic fluid. Unable to assess for sonographic Murphy's sign.  Common bile duct: Diameter: Approximately 3 mm  Liver: No focal lesion identified. Within normal limits in parenchymal echogenicity.  IVC: No abnormality visualized.  Pancreas: Not visualized due to overlying bowel gas and body habitus.  Spleen: Size and appearance within normal limits.  Right Kidney: Length: 14.2 cm. No hydronephrosis. There is a 7 cm cyst off the superior pole.  Left Kidney: Length: 12.4 cm. No hydronephrosis. There is a 2.2 cm hypoechoic lesion within the interpolar region of the left kidney, incompletely characterized.  Abdominal aorta: Not well visualized.  Other findings: None.  IMPRESSION: Possible sludge within the gallbladder lumen. No definite sonographic evidence to suggest acute cholecystitis.  No hydronephrosis.  Indeterminate hypoechoic mass within the interpolar region left kidney, potentially representing a complicated cyst. Solid mass is not excluded. Consider correlation  with dedicated cross-sectional imaging when patient clinically able.   Electronically Signed   By: Lovey Newcomer M.D.   On: 06/16/2015 18:21     Medications:   . dextrose 5 % and 0.45% NaCl 75 mL/hr at 06/18/15 0000  . fentaNYL infusion INTRAVENOUS 100 mcg/hr (06/18/15 0814)  . norepinephrine    . pureflow 2,500 mL/hr at 06/18/15 0502   . amiodarone  400 mg Oral BID  . antiseptic oral rinse  7 mL Mouth Rinse QID  . budesonide (PULMICORT) nebulizer solution  0.5 mg Nebulization BID  . chlorhexidine gluconate  15 mL Mouth Rinse BID  . ciprofloxacin  400 mg Intravenous Q12H  . clonazePAM  0.5 mg Oral BID  . famotidine  20 mg Oral Q24H  . ipratropium-albuterol  3 mL Nebulization Q6H   ammonium lactate, anticoagulant sodium citrate, midazolam, rocuronium  Assessment/ Plan:  64 y.o. male with a PMHx of congestive heart failure ejection fraction 30-35%, prior pneumonia, lower extremity edema with cellulitis,  atrial fibrillation who was admitted to [facility] on 06/10/2015 for evaluation of altered mental status and shortness of breath. Upon admission the patient underwent intubation with transition to a mechanical ventilation.  1. Acute renal failure/chronic kidney disease stage III with baseline creatinine 1.3/proteinuria. The patient most likely has acute renal failure secondary to altered cardiorenal hemodynamics. His ejection fraction is low at 30%. -Oliguria persists, continue CRRT at this time, UF target  Increase to 150cc/hr.    2. Acute respiratory failure. Remains on the vent, fio2 remains 35%, continue volume removal to treat underlying heart failure/pulmonary edema.   3.anasarca from  Acute systolic heart failure. EF 30-35%. - d/c 1/2 ND-D5 as it is contributing to volume - use D10 for low blood sugars - UF 150 cc/hr (with iv albumin for oncotic support)    LOS: 8 Titianna Loomis 8/22/20168:34 AM

## 2015-06-18 NOTE — Progress Notes (Signed)
[facility] NOTE  Pharmacy Consult for Electrolytes/CRRT dose adjustment    Allergies  Allergen Reactions  . Penicillins Nausea And Vomiting    Patient Measurements: Height: 5\' 8"  (172.7 cm) Weight: 273 lb 13 oz (124.2 kg) IBW/kg (Calculated) : 68.4   Vital Signs: Temp: 97.5 F (36.4 C) (08/22 0700) Temp Source: Core (Comment) (08/22 0000) BP: 100/65 mmHg (08/22 0700) Pulse Rate: 77 (08/22 0700) Intake/Output from previous day: 08/21 0701 - 08/22 0700 In: 2559.5 [I.V.:2157.5; NG/GT:30; IV Piggyback:372] Out: 2351 [Urine:35] Intake/Output from this shift:   Vent settings for last 24 hours: Vent Mode:  [-] PRVC FiO2 (%):  [35 %] 35 % Set Rate:  [24 bmp] 24 bmp Vt Set:  [50 mL-500 mL] 500 mL PEEP:  [5 cmH20] 5 cmH20  Labs:  Recent Labs  06/16/15 0006  06/17/15 0423  06/17/15 1151 06/17/15 1555 06/17/15 1952 06/18/15 0010 06/18/15 0408  WBC 5.4  --  5.3  --   --   --   --   --  5.1  HGB 11.0*  --  10.8*  --   --   --   --   --  10.9*  HCT 34.3*  --  33.9*  --   --   --   --   --  33.4*  PLT 36*  --  33*  --   --   --   --   --  34*  CREATININE 1.56*  < > 1.23  < > 1.27* 1.30* 1.25* 1.18 1.20  MG 1.7  < > 1.7  < > 1.6* 2.3  --  1.8  --   PHOS 2.8  < > 2.2*  < > 2.0* 2.3* 2.5 2.4*  --   ALBUMIN 2.3*  < > 2.3*  < > 2.1* 2.4* 2.3* 2.4*  --   < > = values in this interval not displayed. Estimated Creatinine Clearance: 81.9 mL/min (by C-G formula based on Cr of 1.2).   Recent Labs  06/17/15 2330 06/18/15 0013 06/18/15 0336  GLUCAP 74 111* 95    Microbiology: Recent Results (from the past 720 hour(s))  Blood Culture (routine x 2)     Status: None   Collection Time: 06/10/15  2:35 PM  Result Value Ref Range Status   Specimen Description BLOOD RIGHT WRIST  Final   Special Requests BOTTLES DRAWN AEROBIC AND ANAEROBIC  1CC  Final   Culture NO GROWTH 5 DAYS  Final   Report Status 06/15/2015 FINAL  Final  Blood Culture (routine x 2)      Status: None   Collection Time: 06/10/15  2:40 PM  Result Value Ref Range Status   Specimen Description BLOOD LEFT ASSIST CONTROL  Final   Special Requests   Final    BOTTLES DRAWN AEROBIC AND ANAEROBIC  AER 6CC ANA 4CC   Culture NO GROWTH 5 DAYS  Final   Report Status 06/15/2015 FINAL  Final  Urine culture     Status: None   Collection Time: 06/10/15  4:42 PM  Result Value Ref Range Status   Specimen Description URINE, RANDOM  Final   Special Requests NONE  Final   Culture 4,000 COLONIES/mL INSIGNIFICANT GROWTH  Final   Report Status 06/12/2015 FINAL  Final  MRSA PCR Screening     Status: None   Collection Time: 06/10/15  6:27 PM  Result Value Ref Range Status   MRSA by PCR NEGATIVE NEGATIVE Final    Comment:  The GeneXpert MRSA Assay (FDA approved for NASAL specimens only), is one component of a comprehensive MRSA colonization surveillance program. It is not intended to diagnose MRSA infection nor to guide or monitor treatment for MRSA infections.   Culture, sputum-assessment     Status: None   Collection Time: 06/11/15  2:50 PM  Result Value Ref Range Status   Specimen Description SPUTUM  Final   Special Requests NONE  Final   Sputum evaluation THIS SPECIMEN IS ACCEPTABLE FOR SPUTUM CULTURE  Final   Report Status 06/13/2015 FINAL  Final  Culture, respiratory (NON-Expectorated)     Status: None   Collection Time: 06/11/15  2:50 PM  Result Value Ref Range Status   Specimen Description SPUTUM  Final   Special Requests NONE Reflexed from [id number]  Final   Gram Stain   Final    MODERATE WBC SEEN RARE GRAM POSITIVE COCCI GOOD SPECIMEN - 80-90% WBCS    Culture MODERATE GROWTH STAPHYLOCOCCUS AUREUS  Final   Report Status 06/14/2015 FINAL  Final   Organism ID, Bacteria STAPHYLOCOCCUS AUREUS  Final      Susceptibility   Staphylococcus aureus - MIC*    CIPROFLOXACIN <=0.5 SENSITIVE Sensitive     GENTAMICIN <=0.5 SENSITIVE Sensitive     OXACILLIN 0.5 SENSITIVE  Sensitive     TRIMETH/SULFA <=10 SENSITIVE Sensitive     CEFOXITIN SCREEN NEGATIVE Sensitive     Inducible Clindamycin NEGATIVE Sensitive     TETRACYCLINE Value in next row Sensitive      SENSITIVE<=1    * MODERATE GROWTH STAPHYLOCOCCUS AUREUS  Wound culture     Status: None   Collection Time: 06/13/15  1:56 PM  Result Value Ref Range Status   Specimen Description LEG  Final   Special Requests Normal  Final   Gram Stain   Final    FEW WBC SEEN MODERATE GRAM NEGATIVE RODS FEW GRAM POSITIVE COCCI    Culture   Final    LIGHT GROWTH STAPHYLOCOCCUS AUREUS RARE GROWTH PROTEUS PENNERI RARE GROWTH ENTEROCOCCUS FAECALIS    Report Status 06/17/2015 FINAL  Final   Organism ID, Bacteria STAPHYLOCOCCUS AUREUS  Final   Organism ID, Bacteria PROTEUS PENNERI  Final   Organism ID, Bacteria ENTEROCOCCUS FAECALIS  Final      Susceptibility   Staphylococcus aureus - MIC*    CIPROFLOXACIN <=0.5 SENSITIVE Sensitive     GENTAMICIN <=0.5 SENSITIVE Sensitive     OXACILLIN 0.5 SENSITIVE Sensitive     TRIMETH/SULFA <=10 SENSITIVE Sensitive     CEFOXITIN SCREEN NEGATIVE Sensitive     Inducible Clindamycin NEGATIVE Sensitive     TETRACYCLINE Value in next row Sensitive      SENSITIVE<=1    * LIGHT GROWTH STAPHYLOCOCCUS AUREUS   Proteus penneri - MIC*    AMPICILLIN Value in next row Resistant      SENSITIVE<=1    CEFAZOLIN Value in next row Resistant      SENSITIVE<=1    CEFTRIAXONE Value in next row Sensitive      SENSITIVE<=1    CIPROFLOXACIN Value in next row Sensitive      SENSITIVE<=1    GENTAMICIN Value in next row Sensitive      SENSITIVE<=1    IMIPENEM Value in next row Sensitive      SENSITIVE<=1    NITROFURANTOIN Value in next row Resistant      SENSITIVE<=1    TRIMETH/SULFA Value in next row Sensitive      SENSITIVE<=1    PIP/TAZO Value  in next row Sensitive      SENSITIVE<=4    * RARE GROWTH PROTEUS PENNERI   Enterococcus faecalis - MIC*    AMPICILLIN Value in next row  Sensitive      SENSITIVE<=4    LINEZOLID Value in next row Sensitive      SENSITIVE<=4    * RARE GROWTH ENTEROCOCCUS FAECALIS  Wound culture     Status: None   Collection Time: 06/13/15  1:58 PM  Result Value Ref Range Status   Specimen Description LEG  Final   Special Requests Normal  Final   Gram Stain RARE WBC SEEN FEW GRAM NEGATIVE RODS   Final   Culture NO GROWTH 4 DAYS  Final   Report Status 06/17/2015 FINAL  Final    Medications:  Scheduled:  . amiodarone  400 mg Oral BID  . antiseptic oral rinse  7 mL Mouth Rinse QID  . budesonide (PULMICORT) nebulizer solution  0.5 mg Nebulization BID  . chlorhexidine gluconate  15 mL Mouth Rinse BID  . ciprofloxacin  400 mg Intravenous Q12H  . clonazePAM  0.5 mg Oral BID  . famotidine  20 mg Oral Q24H  . ipratropium-albuterol  3 mL Nebulization Q6H    Assessment: 64 y/o M with ventilated, sedated on CRRT. Electrolytes are wnl.    Plan:  No need for electrolyte supplementation or dosage adjustments at present. Will continue to monitor.   Ulice Dash D 06/18/2015,7:44 AM

## 2015-06-18 NOTE — Progress Notes (Signed)
Palliative Medicine Inpatient Consult Follow Up Note   Name: Aaron Harrison Date: 06/18/2015 MRN: [id number]  DOB: June 03, 1951  Referring Physician: Fritzi Mandes, MD  Palliative Care consult requested for this 64 y.o. male for goals of medical therapy in patient with multiple problems associated with his initial acute respiratory failure due to CAP and AFIB with RVR. He has multiple organ systems involved and he is not doing well. His only family is [name], a first cousin who has not really seen pt in many years.  [name] is the person involved in making decisions, but he is looking to guidance from palliative care and critical cared physicians.  The patient reportedly started to wake up this am, but he has again become unresponsive.   TODAY'S EVENTS, CONVERSATIONS, AND PLAN: 1) Pt continues with Limited Code status as defined in orders.  2) He has been somewhat responsive earlier this am to the nurse.  He answered questions appropriately by nodding yes or no to about 6 different questions by nurse. Now, he is again unresponsive.  3) He has 'some kind of LIVER DISEASE' and was ON LACTULOSE at home.  However, recent outpt notes do not refer to any lactulose on home med lists.           -----He got some lactulose here, but it was stopped per nurse b/c no one knew why he was on it.   ---Total bili was 6.7 8 days ago and AST was 301 and ALT was 150.  As discussed in multidisciplinary rounds in the ICU this am, he will need twice weekly LFTS, and an ammonia level checked today.  He may need regular lactulose, if we are trying to see if he is going to become more responsive (as he was reportedly responsive this am.)   --- Ultrasound of liver showed gall bladder sludge and normal appearing liver parenchyma. Could he have had gallbladder disease or biliary issues --more so than 'cirrhosis'?  No one can say why he was on lactulose at home and no one is aware of an alcoholism history. 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  4)  Despite the 'good news' of him being more responsive, he has no other good news.  He is not able to tolerate spontaneous respirations this am as he does not pull the tidal volume he needs and he de-sats, so he was put back on vent controlled respirations.     5)  He is very edematous and oliguric.  Nephrologist is giving albumin and changing CRRT orders to attempt to get some of the fluid off.  6)  Ultrasound also shows a 2.2 cm Left kidney possible complicated cyst.   7)  No Terminal Wean for today, due to the development of him responding appropriately briefly earlier this am.  Even if weaned, it might not be terminal, if he gets to the point where he can pull the tidal volume he needs --since he is not on that much O2.  During MD rounds and in conversation by phone with his cousin, [name], we all have agreed to continue with aggressive care and current limited code status, and to again reassess daily / every 2 days.    REVIEW OF SYSTEMS:  Patient is not able to provide ROS --due to critical illness and altered awareness.  CODE STATUS: Limited code  No compressions, no cardioversion/ defibrilation, no calling a code blue  But can continue vent and IV meds (pressors and  antiarrythmics) as needed.   PAST MEDICAL HISTORY: Past Medical History  Diagnosis Date  . Chronic atrial fibrillation     a. not on long term anticoagulation  . Pneumonia 2011  . Chronic systolic CHF (congestive heart failure)   . Cellulitis     PAST SURGICAL HISTORY: History reviewed. No pertinent past surgical history.  Vital Signs: BP 97/61 mmHg  Pulse 76  Temp(Src) 97.5 F (36.4 C) (Core (Comment))  Resp 24  Ht $R'5\' 8"'FD$  (1.727 m)  Wt 124.2 kg (273 lb 13 oz)  BMI 41.64 kg/m2  SpO2 100% Filed Weights   06/16/15 0500 06/17/15 0500 06/18/15 0500  Weight: 128.2 kg (282 lb 10.1 oz) 123 kg (271 lb 2.7 oz) 124.2 kg (273 lb 13 oz)    Estimated body mass index is 41.64 kg/(m^2)  as calculated from the following:   Height as of this encounter: $RemoveBeforeD'5\' 8"'GttGpjKRAbmqWq$  (1.727 m).   Weight as of this encounter: 124.2 kg (273 lb 13 oz).  PHYSICAL EXAM: He is again nonresponise during Multidisciplinary Rounds --eyelids are half -open and he does not wake up to voice but grimaces and moves to pain No JVD or TM Heart irreg irreg Lungs with vent sounds and occas vent sounds Abd soft and nontender Ext improved appearance of open areas on edematous legs. Skin no mottling  LABS: CBC:    Component Value Date/Time   WBC 5.1 06/18/2015 0408   WBC 19.5* 07/16/2013 0239   HGB 10.9* 06/18/2015 0408   HGB 15.2 07/16/2013 0239   HCT 33.4* 06/18/2015 0408   HCT 43.8 07/16/2013 0239   PLT 34* 06/18/2015 0408   PLT 179 07/16/2013 0239   MCV 103.3* 06/18/2015 0408   MCV 90 07/16/2013 0239   NEUTROABS 7.7* 06/13/2015 0143   LYMPHSABS 0.3* 06/13/2015 0143   MONOABS 0.2 06/13/2015 0143   EOSABS 0.1 06/13/2015 0143   BASOSABS 0.0 06/13/2015 0143   Comprehensive Metabolic Panel:    Component Value Date/Time   NA 138 06/18/2015 0814   NA 137 07/16/2013 0239   K 4.5 06/18/2015 0814   K 3.0* 07/16/2013 0239   CL 105 06/18/2015 0814   CL 103 07/16/2013 0239   CO2 28 06/18/2015 0814   CO2 25 07/16/2013 0239   BUN 14 06/18/2015 0814   BUN 19* 07/16/2013 0239   CREATININE 1.23 06/18/2015 0814   CREATININE 1.53* 07/16/2013 0239   GLUCOSE 93 06/18/2015 0814   GLUCOSE 198* 07/16/2013 0239   CALCIUM 8.3* 06/18/2015 0814   CALCIUM 8.8 07/16/2013 0239   AST 108* 06/11/2015 1519   AST 22 07/16/2013 0239   ALT 112* 06/11/2015 1519   ALT 23 07/16/2013 0239   ALKPHOS 68 06/11/2015 1519   ALKPHOS 91 07/16/2013 0239   BILITOT 5.4* 06/11/2015 1519   BILITOT 1.8* 07/16/2013 0239   PROT 6.0* 06/11/2015 1519   PROT 7.3 07/16/2013 0239   ALBUMIN 2.3* 06/18/2015 0814   ALBUMIN 4.1 07/16/2013 0239    IMPRESSION: 1) Acute Respiratory Failure due to pneumonia (RUL infiltrate on adm CXR) and  pulmonary edema/ CHF ---intubated in the ED 06/10/15 ---extubated 06/12/15 but failed BIPAP and was re-intubated urgently on 06/13/15 ---failing attempts to extubate currently 2) Community Acquired Pneumonia ---Sputum Cx is growing MSSA (Bld and Ur Cxs neg) ---Treated with ABX as per ID consultant (Zosyn/ Vanco --then Unasyn  3) Afib with RVR w/ HR of about 150 at adm  ---Followed by Cardiology Thurmond Butts Dunn PA-C and Aris Everts MD)  ---On Amiodarone  and anticoagulated with heparin drip 4) Acute on Chronic Systolic Congestive Heart Failure with EF of 30-35% ---with chronic lower leg edema worse recently 5) Suspected to have ischemic heart disease ---Mild elevations of troponin noted without diagnosis of MI (demand ischemia)  6) Acute Renal Failure on Chronic Kidney Disease stage 3 ---Likely due to ATN related to poor cardiac function  ---with baseline creatinine 1.3 and proteinuria.  ---On CRRT since 06/13/15 per Nephrologist, Dr. Holley Raring 7) Leg Cellulitis (related to venous stasis edema)--treated with Unasyn per ID 8) Anemia of chronic disease and kidney disease --BUT has macrocytosis with MCV of 007 9) Metabolic Acidosis due to critical illness/ renal disease 10) Electrolyte Disarray --managed daily  11) Unclear history of 'liver disease'.  ---Ammonia level only 26 on 06/10/15 ---DISCUSSED NEED FOR AMMONIA LAB AND POSSIBLE NEED FOR LACTULOSE DURING MULTIDISCIPLINARY ROUNDS THIS AM.  (need to see if hepatic encephalopathy is playing a role in his in-out awareness). ---LFTS were quite elevated on 06/10/15 with AST 301 and ALT 150 adn Total Bilirubin 6.7 with Alk Phos only 87. AST is now down to 108 and ALT is 112 and Total bili is 5.4 now. ---He was on lactulose at home, but no details of the etiology, work-up, or management of his supposed cirrhosis is known.  ---He is said to have ascites, but I do not see an imaging study confirming this  ---there is not any imaging study of  his liver in the records I am able to review ---Now to get twice weekly LFTS and an ammonia level today 12) Possible gall bladder sludge 13) Thrombocytopenia --likely related to liver disease?  14) Macrocytic Anemia (B12/ folate pending --per MD Rounds discussion of pt's mentation).  SEE TOP OF NOTE FOR PLAN   More than 50% of the visit was spent in counseling/coordination of care: YES  Time Spent: 45 min

## 2015-06-18 NOTE — Progress Notes (Signed)
Pt remains intubated with FiO2 at 35%; attempted spontaneous mode twice during shift with PS 18/5 pt became increasingly lethargic with low tidal volumes therefore pt placed back in A/C mode; pt followed simple commands during initial sedation vacation; pt remains on CRRT with an ultrafiltration rate at 150 ml/hr pt currently tolerating well; pt has had a total of 30 ml of urine during entire shift; tube feedings currently infusing blood sugars stable therefore D10W discontinued per Dr Zoila Shutter verbal orders; pt currently sedated with a precedex drip; pts friend updated about plan of care and questions answered; plans to attempt spontaneous weaning trial in the am; will continue to monitor and assess pt

## 2015-06-18 NOTE — Progress Notes (Signed)
[facility] [place] NAME: Aaron Harrison    MR#:  [id number]  DATE OF BIRTH:  [birth year]  SUBJECTIVE:  Remains intubated and on the vent. On IV precedex Sedated No fever  CRRT being tolerated TF on hold Flexiseal+ diarreha slowing down Tolerating vent wean so far REVIEW OF SYSTEMS:   Review of Systems  Unable to perform ROS: intubated   Tolerating Diet:TF on hold Tolerating PT: no,on vent  DRUG ALLERGIES:   Allergies  Allergen Reactions  . Penicillins Nausea And Vomiting    VITALS:  Blood pressure 143/78, pulse 80, temperature 96.6 F (35.9 C), temperature source Core (Comment), resp. rate 24, height 5\' 8"  (1.727 m), weight 124.2 kg (273 lb 13 oz), SpO2 100 %.  PHYSICAL EXAMINATION:   Physical Exam  GENERAL:  64 y.o.-year-old patient lying in the bed with no acute distress. critically ill anasarca EYES: Pupils equal, round, reactive to light and accommodation. No scleral icterus. Extraocular muscles intact.  HEENT: Head atraumatic, normocephalic. Oropharynx and nasopharynx clear.  -orally intubated NECK:  Supple, no jugular venous distention. No thyroid enlargement, no tenderness.  LUNGS: Normal breath sounds bilaterally, no wheezing, rales, rhonchi. No use of accessory muscles of respiration. Left upper chest CL+ CARDIOVASCULAR: S1, S2 normal. No murmurs, rubs, or gallops.  ABDOMEN: Soft, nontender, nondistended. Bowel sounds present. No organomegaly or mass.  -severe scrotal edema EXTREMITIES: bilateral ++++ edema b/l with weeping skin ulcers on tibial shin NEUROLOGIC: sedated and on the vent PSYCHIATRIC: on the vent SKIN:bilateral LE cellulitis and weeping skin ulcers over the tibial shin  LABORATORY PANEL:   CBC  Recent Labs Lab 06/18/15 0408  WBC 5.1  HGB 10.9*  HCT 33.4*  PLT 34*    Chemistries   Recent Labs Lab 06/18/15 0814 06/18/15 1611  NA 138 135  K 4.5 4.5  CL 105 102  CO2 28 26   GLUCOSE 93 139*  BUN 14 14  CREATININE 1.23 1.22  CALCIUM 8.3* 8.3*  MG 1.7  1.8  --   AST 30  --   ALT 23  --     Cardiac Enzymes No results for input(s): TROPONINI in the last 168 hours.  RADIOLOGY:  Dg Chest 1 View  06/18/2015   CLINICAL DATA:  Acute respiratory failure, community-acquired pneumonia, intubated patient.  EXAM: CHEST  1 VIEW  COMPARISON:  Portable chest x-ray of June 17, 2015  FINDINGS: The lungs are adequately inflated. The interstitial markings remain increased with areas of confluence noted particularly on the left. The retrocardiac region remains dense and the left hemidiaphragm remains obscured. The cardiac silhouette is enlarged. The endotracheal tube tip lies approximately 1.7 cm above the carina. The esophagogastric tube tip projects below the inferior margin of the image. The left subclavian venous catheter tip projects over the midportion of the SVC.  IMPRESSION: Cardiomegaly with mild interstitial prominence bilaterally. Alveolar edema or pneumonia on the left with retrocardiac atelectasis. The support apparatus is in stable position.   Electronically Signed   By: David  Martinique M.D.   On: 06/18/2015 07:39   Dg Chest 1 View  06/17/2015   CLINICAL DATA:  Patient with acute respiratory failure and community acquired pneumonia.  EXAM: CHEST  1 VIEW  COMPARISON:  Chest radiograph 06/15/2015  FINDINGS: ET tube terminates in the distal trachea. Left subclavian central venous catheter tip projects over the superior vena cava. Enteric tube courses inferior to the diaphragm. Multiple additional monitoring leads overlie the  patient. Stable cardiomegaly. Low lung volumes. Diffuse bilateral interstitial pulmonary opacities. Small bilateral pleural effusions. No definite pneumothorax.  IMPRESSION: Stable support apparatus.  Cardiomegaly and mild interstitial edema.   Electronically Signed   By: Lovey Newcomer M.D.   On: 06/17/2015 09:15   Dg Abd 1 View  06/18/2015   CLINICAL  DATA:  Followup abdominal distension  EXAM: ABDOMEN - 1 VIEW  COMPARISON:  06/17/2015  FINDINGS: A right femoral dual lumen catheter is again seen and stable. Scattered large and small bowel gas is noted. No obstructive changes are seen. Bony structures show degenerative change of the lumbar spine.  IMPRESSION: No acute abnormality noted.   Electronically Signed   By: Inez Catalina M.D.   On: 06/18/2015 11:57   Dg Abd 1 View  06/17/2015   CLINICAL DATA:  Abdominal distention.  Sepsis.  Respiratory failure.  EXAM: ABDOMEN - 1 VIEW  COMPARISON:  06/13/2015  FINDINGS: Gaseous distention is seen involving the nondependent portions of the colon, mainly the transverse colon, consistent with adynamic ileus. Cecum measures approximately 9.4 cm in diameter. No dilated small bowel loops seen. Catheter is noted within the bladder. Right femoral venous catheter also noted.  IMPRESSION: Moderate colonic ileus.   Electronically Signed   By: Earle Gell M.D.   On: 06/17/2015 08:40     ASSESSMENT AND PLAN:  64 y.o. male with a known history of A. fib, CHF came in with  * Acute respiratory failure with hypoxia: intubated in ED, extubated on August 15 - Reintubated on August 17 due to increased work of breathing. - On  IV precedex gtt now -vent wean being attempted  * Sepsis with multiorgan failure: Likely hospital-acquired pneumonia  Infectious disease input appreciated  -BC negative -wbc stable -no fever  * MSSA Pneumonia- was on  IV Unasyn  * Acute renal failure: Possible cardiorenal syndrome due to underlying CHF. Appreciate nephrology input - temporary dialysis catheter placed in the right groin and CRRT started 8/17  * Acute on chronic systolic CHF  Echo showing ejection fraction 30% to 35% -CRRT with some UF -pt has  severe anasarca  * A. fib with RVR:  Not able to use rate controlling medication due to hypotension. - On po  amiodarone --In the past pt had refused anticoagulation -Continue  aspirin for now  * Elevated troponin: Due to supply demand ischemia  * Bilateral lower extremity cellulitis: change to IV cipro given proteus in the wound  * Hyperkalemia: Resolved  * Ascites/Abnormal liver function test: GI following, on lactulose  *palliative care consult noted. Pt has a very poor prognosis given multiorgan failure.no family. Note from palliatve care indicates trial of vent wean and see if pt shows any signs of improvement  Case discussed with Care Management/Social Worker. Management plans discussed with the patient, family and they are in agreement.  CODE STATUS: DNR DVT Prophylaxis: sq heparin  TOTAL Critical TIME TAKING CARE OF THIS PATIENT: 69minutes.  >50% time spent on counselling and coordination of care    Ty Oshima M.D on 06/18/2015 at 6:21 PM  Between 7am to 6pm - Pager - [phone]  After 6pm go to [email or web address] - password [email or web address] [facility]  [facility]  Office  [phone]  CC: Primary care physician; No PCP Per Patient

## 2015-06-19 DIAGNOSIS — J9601 Acute respiratory failure with hypoxia: Secondary | ICD-10-CM | POA: Diagnosis not present

## 2015-06-19 DIAGNOSIS — R188 Other ascites: Secondary | ICD-10-CM | POA: Diagnosis not present

## 2015-06-19 DIAGNOSIS — N179 Acute kidney failure, unspecified: Secondary | ICD-10-CM | POA: Diagnosis not present

## 2015-06-19 DIAGNOSIS — I5021 Acute systolic (congestive) heart failure: Secondary | ICD-10-CM | POA: Diagnosis not present

## 2015-06-19 LAB — RENAL FUNCTION PANEL
ALBUMIN: 2.9 g/dL — AB (ref 3.5–5.0)
ALBUMIN: 3.1 g/dL — AB (ref 3.5–5.0)
ANION GAP: 5 (ref 5–15)
ANION GAP: 7 (ref 5–15)
ANION GAP: 8 (ref 5–15)
Albumin: 3.1 g/dL — ABNORMAL LOW (ref 3.5–5.0)
BUN: 14 mg/dL (ref 6–20)
BUN: 16 mg/dL (ref 6–20)
BUN: 17 mg/dL (ref 6–20)
CALCIUM: 8.6 mg/dL — AB (ref 8.9–10.3)
CALCIUM: 9 mg/dL (ref 8.9–10.3)
CHLORIDE: 103 mmol/L (ref 101–111)
CO2: 26 mmol/L (ref 22–32)
CO2: 26 mmol/L (ref 22–32)
CO2: 28 mmol/L (ref 22–32)
Calcium: 8.6 mg/dL — ABNORMAL LOW (ref 8.9–10.3)
Chloride: 105 mmol/L (ref 101–111)
Chloride: 104 mmol/L (ref 101–111)
Creatinine, Ser: 1.12 mg/dL (ref 0.61–1.24)
Creatinine, Ser: 1.21 mg/dL (ref 0.61–1.24)
Creatinine, Ser: 1.31 mg/dL — ABNORMAL HIGH (ref 0.61–1.24)
GFR calc Af Amer: >60 mL/min (ref >60)
GFR calc non Af Amer: 57 mL/min — ABNORMAL LOW (ref >60)
GLUCOSE: 124 mg/dL — AB (ref 65–99)
Glucose, Bld: 115 mg/dL — ABNORMAL HIGH (ref 65–99)
Glucose, Bld: 127 mg/dL — ABNORMAL HIGH (ref 65–99)
PHOSPHORUS: 2.1 mg/dL — AB (ref 2.5–4.6)
PHOSPHORUS: 2.2 mg/dL — AB (ref 2.5–4.6)
POTASSIUM: 4.7 mmol/L (ref 3.5–5.1)
POTASSIUM: 4.9 mmol/L (ref 3.5–5.1)
Phosphorus: 2.8 mg/dL (ref 2.5–4.6)
Potassium: 4.8 mmol/L (ref 3.5–5.1)
SODIUM: 138 mmol/L (ref 135–145)
SODIUM: 138 mmol/L (ref 135–145)
Sodium: 136 mmol/L (ref 135–145)

## 2015-06-19 LAB — MAGNESIUM
MAGNESIUM: 1.8 mg/dL (ref 1.7–2.4)
Magnesium: 1.8 mg/dL (ref 1.7–2.4)
Magnesium: 1.8 mg/dL (ref 1.7–2.4)

## 2015-06-19 LAB — GLUCOSE, CAPILLARY
GLUCOSE-CAPILLARY: 120 mg/dL — AB (ref 65–99)
GLUCOSE-CAPILLARY: 127 mg/dL — AB (ref 65–99)
GLUCOSE-CAPILLARY: 128 mg/dL — AB (ref 65–99)
Glucose-Capillary: 109 mg/dL — ABNORMAL HIGH (ref 65–99)

## 2015-06-19 LAB — VITAMIN B12: Vitamin B-12: 683 pg/mL (ref 180–914)

## 2015-06-19 MED ORDER — SODIUM PHOSPHATE 3 MMOLE/ML IV SOLN
15.0000 mmol | Freq: Once | INTRAVENOUS | Status: AC
  Administered 2015-06-19: 15 mmol via INTRAVENOUS
  Filled 2015-06-19: qty 5

## 2015-06-19 MED ORDER — FAMOTIDINE 20 MG PO TABS
20.0000 mg | ORAL_TABLET | ORAL | Status: DC
  Administered 2015-06-20 – 2015-06-24 (×2): 20 mg via ORAL
  Filled 2015-06-19 (×2): qty 1

## 2015-06-19 MED ORDER — SODIUM CHLORIDE 0.9 % IV SOLN
3.0000 g | Freq: Three times a day (TID) | INTRAVENOUS | Status: DC
  Administered 2015-06-19 – 2015-06-22 (×9): 3 g via INTRAVENOUS
  Filled 2015-06-19 (×13): qty 3

## 2015-06-19 MED ORDER — PRO-STAT SUGAR FREE PO LIQD
30.0000 mL | Freq: Three times a day (TID) | ORAL | Status: DC
  Administered 2015-06-19 – 2015-06-21 (×5): 30 mL via ORAL

## 2015-06-19 NOTE — Progress Notes (Signed)
Patient remained on CRRT throughout shift.  Pt failed SBT this am due to agitation and weakness.  He went into afib RVR in 16

## 2015-06-19 NOTE — Progress Notes (Signed)
Subjective:  Pt remains on CRRT. Remains oliguric. UF 150 cc/hr Abdomen distended Continues to have massive edema UF 3000 cc last 24 hrs Net neg by -1600 cc   Objective:  Vital signs in last 24 hours:  Temp:  [96.6 F (35.9 C)-97.7 F (36.5 C)] 97.2 F (36.2 C) (08/23 0700) Pulse Rate:  [65-94] 89 (08/23 0700) Resp:  [17-25] 24 (08/23 0700) BP: (90-147)/(65-100) 145/100 mmHg (08/23 0700) SpO2:  [93 %-100 %] 100 % (08/23 0700) FiO2 (%):  [35 %] 35 % (08/23 0840) Weight:  [119.1 kg (262 lb 9.1 oz)] 119.1 kg (262 lb 9.1 oz) (08/23 0400)  Weight change: -5.1 kg (-11 lb 3.9 oz) Filed Weights   06/17/15 0500 06/18/15 0500 06/19/15 0400  Weight: 123 kg (271 lb 2.7 oz) 124.2 kg (273 lb 13 oz) 119.1 kg (262 lb 9.1 oz)    Intake/Output: I/O last 3 completed shifts: In: 2937.7 [I.V.:1769.2; NG/GT:382.5; IV Piggyback:786] Out: 1478 [Urine:25; GNFAO:1308; Stool:100]   Intake/Output this shift:     Physical Exam: General: Critically ill appearing  Head: Normocephalic, atraumatic. ETT in place  Eyes: anicteric  Neck: Supple, trachea midline  Lungs:  Bilateral rhonchi; vent assisted  Heart: Irregular S1S2 no rubs  Abdomen:  nontender, mild distension, tense  Extremities:  ++ generalized  edema  Neurologic: Intubated, arousable however  Skin: ulcertaion bilateral lower extremities on shins, ? jaundice   Access: Femoral dialysis catheter    Basic Metabolic Panel:  Recent Labs Lab 06/17/15 1151 06/17/15 1555 06/17/15 1952 06/18/15 0010 06/18/15 0408 06/18/15 0814 06/18/15 1611 06/19/15 0036  NA 137 137 138 136 138 138 135 136  K 4.3 4.5 4.5 4.4 4.5 4.5 4.5 4.7  CL 106 105 106 104 105 105 102 103  CO2 27 27 28 28 28 28 26 26   GLUCOSE 112* 107* 100* 101* 96 93 139* 115*  BUN 17 16 15 15 15 14 14 14   CREATININE 1.27* 1.30* 1.25* 1.18 1.20 1.23 1.22 1.21  CALCIUM 7.9* 8.2* 8.3* 8.1* 8.2* 8.3* 8.3* 8.6*  MG 1.6* 2.3  --  1.8  --  1.7  1.8  --  1.8  PHOS 2.0* 2.3*  2.5 2.4*  --  2.2* 2.2* 2.1*    Liver Function Tests:  Recent Labs Lab 06/17/15 1952 06/18/15 0010 06/18/15 0814 06/18/15 1611 06/19/15 0036  AST  --   --  30  --   --   ALT  --   --  23  --   --   ALBUMIN 2.3* 2.4* 2.3* 2.7* 2.9*   No results for input(s): LIPASE, AMYLASE in the last 168 hours.  Recent Labs Lab 06/18/15 1115  AMMONIA 24    CBC:  Recent Labs Lab 06/13/15 0143 06/14/15 0440 06/15/15 1609 06/16/15 0006 06/17/15 0423 06/18/15 0408  WBC 8.4 5.9 4.2 5.4 5.3 5.1  NEUTROABS 7.7*  --   --   --   --   --   HGB 12.2* 10.7* 10.6* 11.0* 10.8* 10.9*  HCT 38.5* 32.9* 34.5* 34.3* 33.9* 33.4*  MCV 107.8* 103.3* 106.7* 104.1* 103.0* 103.3*  PLT 85* 57* 46* 36* 33* 34*    Cardiac Enzymes: No results for input(s): CKTOTAL, CKMB, CKMBINDEX, TROPONINI in the last 168 hours.  BNP: Invalid input(s): POCBNP  CBG:  Recent Labs Lab 06/18/15 1834 06/18/15 1951 06/18/15 2355 06/19/15 0409 06/19/15 0724  GLUCAP 103* 108* 129* 120* 109*    Microbiology: Results for orders placed or performed during the hospital encounter of  06/10/15  Blood Culture (routine x 2)     Status: None   Collection Time: 06/10/15  2:35 PM  Result Value Ref Range Status   Specimen Description BLOOD RIGHT WRIST  Final   Special Requests BOTTLES DRAWN AEROBIC AND ANAEROBIC  1CC  Final   Culture NO GROWTH 5 DAYS  Final   Report Status 06/15/2015 FINAL  Final  Blood Culture (routine x 2)     Status: None   Collection Time: 06/10/15  2:40 PM  Result Value Ref Range Status   Specimen Description BLOOD LEFT ASSIST CONTROL  Final   Special Requests   Final    BOTTLES DRAWN AEROBIC AND ANAEROBIC  AER 6CC ANA 4CC   Culture NO GROWTH 5 DAYS  Final   Report Status 06/15/2015 FINAL  Final  Urine culture     Status: None   Collection Time: 06/10/15  4:42 PM  Result Value Ref Range Status   Specimen Description URINE, RANDOM  Final   Special Requests NONE  Final   Culture 4,000  COLONIES/mL INSIGNIFICANT GROWTH  Final   Report Status 06/12/2015 FINAL  Final  MRSA PCR Screening     Status: None   Collection Time: 06/10/15  6:27 PM  Result Value Ref Range Status   MRSA by PCR NEGATIVE NEGATIVE Final    Comment:        The GeneXpert MRSA Assay (FDA approved for NASAL specimens only), is one component of a comprehensive MRSA colonization surveillance program. It is not intended to diagnose MRSA infection nor to guide or monitor treatment for MRSA infections.   Culture, sputum-assessment     Status: None   Collection Time: 06/11/15  2:50 PM  Result Value Ref Range Status   Specimen Description SPUTUM  Final   Special Requests NONE  Final   Sputum evaluation THIS SPECIMEN IS ACCEPTABLE FOR SPUTUM CULTURE  Final   Report Status 06/13/2015 FINAL  Final  Culture, respiratory (NON-Expectorated)     Status: None   Collection Time: 06/11/15  2:50 PM  Result Value Ref Range Status   Specimen Description SPUTUM  Final   Special Requests NONE Reflexed from [id number]  Final   Gram Stain   Final    MODERATE WBC SEEN RARE GRAM POSITIVE COCCI GOOD SPECIMEN - 80-90% WBCS    Culture MODERATE GROWTH STAPHYLOCOCCUS AUREUS  Final   Report Status 06/14/2015 FINAL  Final   Organism ID, Bacteria STAPHYLOCOCCUS AUREUS  Final      Susceptibility   Staphylococcus aureus - MIC*    CIPROFLOXACIN <=0.5 SENSITIVE Sensitive     GENTAMICIN <=0.5 SENSITIVE Sensitive     OXACILLIN 0.5 SENSITIVE Sensitive     TRIMETH/SULFA <=10 SENSITIVE Sensitive     CEFOXITIN SCREEN NEGATIVE Sensitive     Inducible Clindamycin NEGATIVE Sensitive     TETRACYCLINE Value in next row Sensitive      SENSITIVE<=1    * MODERATE GROWTH STAPHYLOCOCCUS AUREUS  Wound culture     Status: None   Collection Time: 06/13/15  1:56 PM  Result Value Ref Range Status   Specimen Description LEG  Final   Special Requests Normal  Final   Gram Stain   Final    FEW WBC SEEN MODERATE GRAM NEGATIVE RODS FEW GRAM  POSITIVE COCCI    Culture   Final    LIGHT GROWTH STAPHYLOCOCCUS AUREUS RARE GROWTH PROTEUS PENNERI RARE GROWTH ENTEROCOCCUS FAECALIS    Report Status 06/17/2015 FINAL  Final   Organism  ID, Bacteria STAPHYLOCOCCUS AUREUS  Final   Organism ID, Bacteria PROTEUS PENNERI  Final   Organism ID, Bacteria ENTEROCOCCUS FAECALIS  Final      Susceptibility   Staphylococcus aureus - MIC*    CIPROFLOXACIN <=0.5 SENSITIVE Sensitive     GENTAMICIN <=0.5 SENSITIVE Sensitive     OXACILLIN 0.5 SENSITIVE Sensitive     TRIMETH/SULFA <=10 SENSITIVE Sensitive     CEFOXITIN SCREEN NEGATIVE Sensitive     Inducible Clindamycin NEGATIVE Sensitive     TETRACYCLINE Value in next row Sensitive      SENSITIVE<=1    * LIGHT GROWTH STAPHYLOCOCCUS AUREUS   Proteus penneri - MIC*    AMPICILLIN Value in next row Resistant      SENSITIVE<=1    CEFAZOLIN Value in next row Resistant      SENSITIVE<=1    CEFTRIAXONE Value in next row Sensitive      SENSITIVE<=1    CIPROFLOXACIN Value in next row Sensitive      SENSITIVE<=1    GENTAMICIN Value in next row Sensitive      SENSITIVE<=1    IMIPENEM Value in next row Sensitive      SENSITIVE<=1    NITROFURANTOIN Value in next row Resistant      SENSITIVE<=1    TRIMETH/SULFA Value in next row Sensitive      SENSITIVE<=1    PIP/TAZO Value in next row Sensitive      SENSITIVE<=4    * RARE GROWTH PROTEUS PENNERI   Enterococcus faecalis - MIC*    AMPICILLIN Value in next row Sensitive      SENSITIVE<=4    LINEZOLID Value in next row Sensitive      SENSITIVE<=4    * RARE GROWTH ENTEROCOCCUS FAECALIS  Wound culture     Status: None   Collection Time: 06/13/15  1:58 PM  Result Value Ref Range Status   Specimen Description LEG  Final   Special Requests Normal  Final   Gram Stain RARE WBC SEEN FEW GRAM NEGATIVE RODS   Final   Culture NO GROWTH 4 DAYS  Final   Report Status 06/17/2015 FINAL  Final    Coagulation Studies: No results for input(s): LABPROT, INR in  the last 72 hours.  Urinalysis: No results for input(s): COLORURINE, LABSPEC, PHURINE, GLUCOSEU, HGBUR, BILIRUBINUR, KETONESUR, PROTEINUR, UROBILINOGEN, NITRITE, LEUKOCYTESUR in the last 72 hours.  Invalid input(s): APPERANCEUR    Imaging: Dg Chest 1 View  06/18/2015   CLINICAL DATA:  Acute respiratory failure, community-acquired pneumonia, intubated patient.  EXAM: CHEST  1 VIEW  COMPARISON:  Portable chest x-ray of June 17, 2015  FINDINGS: The lungs are adequately inflated. The interstitial markings remain increased with areas of confluence noted particularly on the left. The retrocardiac region remains dense and the left hemidiaphragm remains obscured. The cardiac silhouette is enlarged. The endotracheal tube tip lies approximately 1.7 cm above the carina. The esophagogastric tube tip projects below the inferior margin of the image. The left subclavian venous catheter tip projects over the midportion of the SVC.  IMPRESSION: Cardiomegaly with mild interstitial prominence bilaterally. Alveolar edema or pneumonia on the left with retrocardiac atelectasis. The support apparatus is in stable position.   Electronically Signed   By: David  Martinique M.D.   On: 06/18/2015 07:39   Dg Abd 1 View  06/18/2015   CLINICAL DATA:  Followup abdominal distension  EXAM: ABDOMEN - 1 VIEW  COMPARISON:  06/17/2015  FINDINGS: A right femoral dual lumen catheter is again seen  and stable. Scattered large and small bowel gas is noted. No obstructive changes are seen. Bony structures show degenerative change of the lumbar spine.  IMPRESSION: No acute abnormality noted.   Electronically Signed   By: Inez Catalina M.D.   On: 06/18/2015 11:57     Medications:   . dexmedetomidine 0.6 mcg/kg/hr (06/19/15 0730)  . feeding supplement (VITAL HIGH PROTEIN) 1,000 mL (06/19/15 0130)  . norepinephrine    . pureflow 2,500 mL/hr at 06/19/15 0318   . albumin human  12.5 g Intravenous Q6H  . amiodarone  400 mg Oral BID  .  antiseptic oral rinse  7 mL Mouth Rinse QID  . budesonide (PULMICORT) nebulizer solution  0.5 mg Nebulization BID  . chlorhexidine gluconate  15 mL Mouth Rinse BID  . ciprofloxacin  400 mg Intravenous Q12H  . clonazePAM  0.5 mg Oral BID  . [START ON 06/20/2015] famotidine  20 mg Oral Q48H  . feeding supplement (PRO-STAT SUGAR FREE 64)  30 mL Oral BID  . ipratropium-albuterol  3 mL Nebulization Q6H   ammonium lactate, anticoagulant sodium citrate, fentaNYL (SUBLIMAZE) injection, rocuronium  Assessment/ Plan:  64 y.o. male with a PMHx of congestive heart failure ejection fraction 30-35%, prior pneumonia, lower extremity edema with cellulitis, atrial fibrillation who was admitted to [facility] on 06/10/2015 for evaluation of altered mental status and shortness of breath. Upon admission the patient underwent intubation with transition to a mechanical ventilation.  1. Acute renal failure/chronic kidney disease stage III with baseline creatinine 1.3/proteinuria. The patient most likely has acute renal failure secondary to altered cardiorenal hemodynamics. His ejection fraction is low at 30%. - remains off pressors -Oliguria persists, continue CRRT at this time, UF target  Increase to 200cc/hr.    2. Acute respiratory failure. Remains on the vent, fio2 remains 35%, continue volume removal to treat underlying heart failure/pulmonary edema.   3.anasarca from  Acute systolic heart failure. EF 30-35%. - d/c 1/2 ND-D5 as it is contributing to volume - use D10 for low blood sugars - UF 200 cc/hr (with iv albumin for oncotic support)    LOS: 9 [facility] 8/23/20169:07 AM

## 2015-06-19 NOTE — Progress Notes (Signed)
Pt remains on precedex at .6 mcg/kg/hr and intubated.  Pt will arouse to stimulation, will follow commands.  Pt remains afib on the cardiac monitor.  Lungs are clear/diminished.  Pt has bowel sounds but they are hypoactive and faint.  Pt remains on tube feeds, however, the residuals have remained high during the night.  Last residuals are 520, and according to the adult enteral nutrition protocol, tube feeds were cut in half.  Pt's tube feeds are now running at 15cc/hr with 25 flush q4 hr.    Pt remains on crrt and is tolerating it well.  BP has remained stable.  Pt had multiple visitors last night as noted in prior progress note.  Pt appears to be resting comfortably.  Will continue to monitor.

## 2015-06-19 NOTE — Progress Notes (Addendum)
[place] [facility] [place] NAME: Aaron Harrison    MR#:  [id number]  DATE OF BIRTH:  27-Oct-1951  SUBJECTIVE:  Remains intubated and on the vent. On IV precedex, follows simple commands No fever  CRRT being tolerated TF resumed Flexiseal+ diarreha slowing down Did not tolerate vent wean y'day.  REVIEW OF SYSTEMS:   Review of Systems  Unable to perform ROS: intubated   Tolerating Diet:TF resumed Tolerating PT: no,on vent   DRUG ALLERGIES:   Allergies  Allergen Reactions  . Penicillins Nausea And Vomiting    VITALS:  Blood pressure 145/100, pulse 89, temperature 97.2 F (36.2 C), temperature source Core (Comment), resp. rate 24, height 5\' 8"  (1.727 m), weight 119.1 kg (262 lb 9.1 oz), SpO2 100 %.  PHYSICAL EXAMINATION:   Physical Exam  GENERAL:  64 y.o.-year-old patient lying in the bed with no acute distress. critically ill anasarca EYES: Pupils equal, round, reactive to light and accommodation. No scleral icterus. Extraocular muscles intact.  HEENT: Head atraumatic, normocephalic. Oropharynx and nasopharynx clear.  -orally intubated NECK:  Supple, no jugular venous distention. No thyroid enlargement, no tenderness.  LUNGS: Normal breath sounds bilaterally, no wheezing, rales, rhonchi. No use of accessory muscles of respiration. Left upper chest CL+ CARDIOVASCULAR: S1, S2 normal. No murmurs, rubs, or gallops.  ABDOMEN: Soft, nontender, nondistended. Bowel sounds present. No organomegaly or mass.  -severe scrotal edema EXTREMITIES: bilateral ++++ edema b/l with weeping skin ulcers on tibial shin NEUROLOGIC: sedated and on the vent PSYCHIATRIC: on the vent SKIN:bilateral LE cellulitis and weeping skin ulcers over the tibial shin  LABORATORY PANEL:   CBC  Recent Labs Lab 06/18/15 0408  WBC 5.1  HGB 10.9*  HCT 33.4*  PLT 34*    Chemistries   Recent Labs Lab 06/18/15 0814  06/19/15 0919  NA 138  < > 138  K 4.5   < > 4.9  CL 105  < > 105  CO2 28  < > 28  GLUCOSE 93  < > 127*  BUN 14  < > 17  CREATININE 1.23  < > 1.31*  CALCIUM 8.3*  < > 8.6*  MG 1.7  1.8  < > 1.8  AST 30  --   --   ALT 23  --   --   < > = values in this interval not displayed.  Cardiac Enzymes No results for input(s): TROPONINI in the last 168 hours.  RADIOLOGY:  Dg Chest 1 View  06/18/2015   CLINICAL DATA:  Acute respiratory failure, community-acquired pneumonia, intubated patient.  EXAM: CHEST  1 VIEW  COMPARISON:  Portable chest x-ray of June 17, 2015  FINDINGS: The lungs are adequately inflated. The interstitial markings remain increased with areas of confluence noted particularly on the left. The retrocardiac region remains dense and the left hemidiaphragm remains obscured. The cardiac silhouette is enlarged. The endotracheal tube tip lies approximately 1.7 cm above the carina. The esophagogastric tube tip projects below the inferior margin of the image. The left subclavian venous catheter tip projects over the midportion of the SVC.  IMPRESSION: Cardiomegaly with mild interstitial prominence bilaterally. Alveolar edema or pneumonia on the left with retrocardiac atelectasis. The support apparatus is in stable position.   Electronically Signed   By: David  Martinique M.D.   On: 06/18/2015 07:39   Dg Abd 1 View  06/18/2015   CLINICAL DATA:  Followup abdominal distension  EXAM: ABDOMEN - 1 VIEW  COMPARISON:  06/17/2015  FINDINGS: A right femoral dual lumen catheter is again seen and stable. Scattered large and small bowel gas is noted. No obstructive changes are seen. Bony structures show degenerative change of the lumbar spine.  IMPRESSION: No acute abnormality noted.   Electronically Signed   By: Inez Catalina M.D.   On: 06/18/2015 11:57     ASSESSMENT AND PLAN:  64 y.o. male with a known history of A. fib, CHF came in with  * Acute respiratory failure with hypoxia: intubated in ED, extubated on August 15 - Reintubated on August  17 due to increased work of breathing. - On  IV precedex gtt now -vent wean being attempted, failed y'day. Try again today  * Sepsis with multiorgan failure: Likely hospital-acquired pneumonia -BC negative -wbc stable -no fever  * MSSA Pneumonia- was on  IV Unasyn  * Acute renal failure: Possible cardiorenal syndrome due to underlying CHF. Appreciate nephrology input - temporary dialysis catheter placed in the right groin and CRRT started 8/17  * Acute on chronic systolic CHF  Echo showing ejection fraction 30% to 35% -CRRT with some UF -pt has  severe anasarca  * A. fib with RVR:  Not able to use rate controlling medication due to hypotension. - On po  amiodarone --In the past pt had refused anticoagulation -Continue aspirin for now  * Elevated troponin: Due to supply demand ischemia  * Bilateral lower extremity cellulitis: change to IV cipro given proteus in the wound Seen by wound care RN. Follow recommedations  * Hyperkalemia: Resolved  * Ascites/Abnormal liver function test: GI following, on lactulose  *palliative care consult noted. Pt has a very poor prognosis given multiorgan failure.no family. Note from palliatve care indicates trial of vent wean and see if pt shows any signs of improvement  Case discussed with Care Management/Social Worker. CODE STATUS: DNR DVT Prophylaxis: sq heparin  TOTAL Critical TIME TAKING CARE OF THIS PATIENT: 65minutes.  >50% time spent on counselling and coordination of care with RN, Dr Carlene Coria M.D on 06/19/2015 at 11:04 AM  Between 7am to 6pm - Pager - [phone]  After 6pm go to [email or web address] - password [email or web address] [facility]  [facility]  Office  [phone]  CC: Primary care physician; No PCP Per Patient

## 2015-06-19 NOTE — Progress Notes (Signed)
[facility] NOTE  Pharmacy Consult for Electrolytes/CRRT dose adjustment    Allergies  Allergen Reactions  . Penicillins Nausea And Vomiting    Patient Measurements: Height: 5\' 8"  (172.7 cm) Weight: 262 lb 9.1 oz (119.1 kg) IBW/kg (Calculated) : 68.4   Vital Signs: Temp: 97.2 F (36.2 C) (08/23 0700) Temp Source: Core (Comment) (08/23 0700) BP: 145/100 mmHg (08/23 0700) Pulse Rate: 89 (08/23 0700) Intake/Output from previous day: 08/22 0701 - 08/23 0700 In: 1489.2 [I.V.:636.7; NG/GT:352.5; IV Piggyback:500] Out: 3181 [Urine:20; Stool:100] Intake/Output from this shift:   Vent settings for last 24 hours: Vent Mode:  [-] PRVC FiO2 (%):  [35 %] 35 % Set Rate:  [24 bmp] 24 bmp Vt Set:  [500 mL] 500 mL PEEP:  [5 cmH20] 5 cmH20 Pressure Support:  [18 cmH20] 18 cmH20 Plateau Pressure:  [11 cmH20] 11 cmH20  Labs:  Recent Labs  06/17/15 0423  06/18/15 0408 06/18/15 0814 06/18/15 1611 06/19/15 0036 06/19/15 0919  WBC 5.3  --  5.1  --   --   --   --   HGB 10.8*  --  10.9*  --   --   --   --   HCT 33.9*  --  33.4*  --   --   --   --   PLT 33*  --  34*  --   --   --   --   CREATININE 1.23  < > 1.20 1.23 1.22 1.21 1.31*  MG 1.7  < >  --  1.7  1.8  --  1.8 1.8  PHOS 2.2*  < >  --  2.2* 2.2* 2.1* 2.8  ALBUMIN 2.3*  < >  --  2.3* 2.7* 2.9* 3.1*  AST  --   --   --  30  --   --   --   ALT  --   --   --  23  --   --   --   < > = values in this interval not displayed. Estimated Creatinine Clearance: 73.4 mL/min (by C-G formula based on Cr of 1.31).   Recent Labs  06/18/15 2355 06/19/15 0409 06/19/15 0724  GLUCAP 129* 120* 109*   BMP Latest Ref Rng 06/19/2015 06/19/2015 06/18/2015  Glucose 65 - 99 mg/dL 127(H) 115(H) 139(H)  BUN 6 - 20 mg/dL 17 14 14   Creatinine 0.61 - 1.24 mg/dL 1.31(H) 1.21 1.22  Sodium 135 - 145 mmol/L 138 136 135  Potassium 3.5 - 5.1 mmol/L 4.9 4.7 4.5  Chloride 101 - 111 mmol/L 105 103 102  CO2 22 - 32 mmol/L 28 26 26    Calcium 8.9 - 10.3 mg/dL 8.6(L) 8.6(L) 8.3(L)     Microbiology: Recent Results (from the past 720 hour(s))  Blood Culture (routine x 2)     Status: None   Collection Time: 06/10/15  2:35 PM  Result Value Ref Range Status   Specimen Description BLOOD RIGHT WRIST  Final   Special Requests BOTTLES DRAWN AEROBIC AND ANAEROBIC  1CC  Final   Culture NO GROWTH 5 DAYS  Final   Report Status 06/15/2015 FINAL  Final  Blood Culture (routine x 2)     Status: None   Collection Time: 06/10/15  2:40 PM  Result Value Ref Range Status   Specimen Description BLOOD LEFT ASSIST CONTROL  Final   Special Requests   Final    BOTTLES DRAWN AEROBIC AND ANAEROBIC  AER 6CC ANA 4CC   Culture NO  GROWTH 5 DAYS  Final   Report Status 06/15/2015 FINAL  Final  Urine culture     Status: None   Collection Time: 06/10/15  4:42 PM  Result Value Ref Range Status   Specimen Description URINE, RANDOM  Final   Special Requests NONE  Final   Culture 4,000 COLONIES/mL INSIGNIFICANT GROWTH  Final   Report Status 06/12/2015 FINAL  Final  MRSA PCR Screening     Status: None   Collection Time: 06/10/15  6:27 PM  Result Value Ref Range Status   MRSA by PCR NEGATIVE NEGATIVE Final    Comment:        The GeneXpert MRSA Assay (FDA approved for NASAL specimens only), is one component of a comprehensive MRSA colonization surveillance program. It is not intended to diagnose MRSA infection nor to guide or monitor treatment for MRSA infections.   Culture, sputum-assessment     Status: None   Collection Time: 06/11/15  2:50 PM  Result Value Ref Range Status   Specimen Description SPUTUM  Final   Special Requests NONE  Final   Sputum evaluation THIS SPECIMEN IS ACCEPTABLE FOR SPUTUM CULTURE  Final   Report Status 06/13/2015 FINAL  Final  Culture, respiratory (NON-Expectorated)     Status: None   Collection Time: 06/11/15  2:50 PM  Result Value Ref Range Status   Specimen Description SPUTUM  Final   Special Requests  NONE Reflexed from [id number]  Final   Gram Stain   Final    MODERATE WBC SEEN RARE GRAM POSITIVE COCCI GOOD SPECIMEN - 80-90% WBCS    Culture MODERATE GROWTH STAPHYLOCOCCUS AUREUS  Final   Report Status 06/14/2015 FINAL  Final   Organism ID, Bacteria STAPHYLOCOCCUS AUREUS  Final      Susceptibility   Staphylococcus aureus - MIC*    CIPROFLOXACIN <=0.5 SENSITIVE Sensitive     GENTAMICIN <=0.5 SENSITIVE Sensitive     OXACILLIN 0.5 SENSITIVE Sensitive     TRIMETH/SULFA <=10 SENSITIVE Sensitive     CEFOXITIN SCREEN NEGATIVE Sensitive     Inducible Clindamycin NEGATIVE Sensitive     TETRACYCLINE Value in next row Sensitive      SENSITIVE<=1    * MODERATE GROWTH STAPHYLOCOCCUS AUREUS  Wound culture     Status: None   Collection Time: 06/13/15  1:56 PM  Result Value Ref Range Status   Specimen Description LEG  Final   Special Requests Normal  Final   Gram Stain   Final    FEW WBC SEEN MODERATE GRAM NEGATIVE RODS FEW GRAM POSITIVE COCCI    Culture   Final    LIGHT GROWTH STAPHYLOCOCCUS AUREUS RARE GROWTH PROTEUS PENNERI RARE GROWTH ENTEROCOCCUS FAECALIS    Report Status 06/17/2015 FINAL  Final   Organism ID, Bacteria STAPHYLOCOCCUS AUREUS  Final   Organism ID, Bacteria PROTEUS PENNERI  Final   Organism ID, Bacteria ENTEROCOCCUS FAECALIS  Final      Susceptibility   Staphylococcus aureus - MIC*    CIPROFLOXACIN <=0.5 SENSITIVE Sensitive     GENTAMICIN <=0.5 SENSITIVE Sensitive     OXACILLIN 0.5 SENSITIVE Sensitive     TRIMETH/SULFA <=10 SENSITIVE Sensitive     CEFOXITIN SCREEN NEGATIVE Sensitive     Inducible Clindamycin NEGATIVE Sensitive     TETRACYCLINE Value in next row Sensitive      SENSITIVE<=1    * LIGHT GROWTH STAPHYLOCOCCUS AUREUS   Proteus penneri - MIC*    AMPICILLIN Value in next row Resistant  SENSITIVE<=1    CEFAZOLIN Value in next row Resistant      SENSITIVE<=1    CEFTRIAXONE Value in next row Sensitive      SENSITIVE<=1    CIPROFLOXACIN Value in next  row Sensitive      SENSITIVE<=1    GENTAMICIN Value in next row Sensitive      SENSITIVE<=1    IMIPENEM Value in next row Sensitive      SENSITIVE<=1    NITROFURANTOIN Value in next row Resistant      SENSITIVE<=1    TRIMETH/SULFA Value in next row Sensitive      SENSITIVE<=1    PIP/TAZO Value in next row Sensitive      SENSITIVE<=4    * RARE GROWTH PROTEUS PENNERI   Enterococcus faecalis - MIC*    AMPICILLIN Value in next row Sensitive      SENSITIVE<=4    LINEZOLID Value in next row Sensitive      SENSITIVE<=4    * RARE GROWTH ENTEROCOCCUS FAECALIS  Wound culture     Status: None   Collection Time: 06/13/15  1:58 PM  Result Value Ref Range Status   Specimen Description LEG  Final   Special Requests Normal  Final   Gram Stain RARE WBC SEEN FEW GRAM NEGATIVE RODS   Final   Culture NO GROWTH 4 DAYS  Final   Report Status 06/17/2015 FINAL  Final    Medications:  Scheduled:  . albumin human  12.5 g Intravenous Q6H  . amiodarone  400 mg Oral BID  . ampicillin-sulbactam (UNASYN) IV  3 g Intravenous Q8H  . antiseptic oral rinse  7 mL Mouth Rinse QID  . budesonide (PULMICORT) nebulizer solution  0.5 mg Nebulization BID  . chlorhexidine gluconate  15 mL Mouth Rinse BID  . ciprofloxacin  400 mg Intravenous Q12H  . clonazePAM  0.5 mg Oral BID  . [START ON 06/20/2015] famotidine  20 mg Oral Q48H  . feeding supplement (PRO-STAT SUGAR FREE 64)  30 mL Oral BID  . ipratropium-albuterol  3 mL Nebulization Q6H    Assessment: 64 y/o M ventilated, sedated on CRRT. Electrolytes are wnl.    Plan:  No need for electrolyte supplementationat. Will change famotidine to q 48 hours for CRRT and continue to follow.   Ulice Dash, PharmD 06/19/2015,11:24 AM

## 2015-06-19 NOTE — Consult Note (Signed)
WOC wound consult note Reason for Consult: Bilateral nonintact leg lesions, both anterior and posterior bilateral lower legs.  Generalized edema bilateral lower extremities.  Wound type: Venous insufficiency Pressure Ulcer POA: N/A Measurement: Left anterior calf 2 cm x 1.2 cm scabbed  Lesion left posterior 2 cm x 2 cm x 0.2 cm Right anterior calf 4 cm x 2 cm x 0.2 cm 100% pink and moist posterior right 2 cm x 1 cm x 0.2 cm Wound LKJ:ZPHXT red Drainage (amount, consistency, odor) moderate serosanguinous drainage  No odor Periwound: Dry scaly skin is resolving Dressing procedure/placement/frequency:CLeanse bilateral legs with soap and water.  Apply Xeroform gauze to nonintact lesions.  Cover with ABD pad and kerlix.  Secure with tape.  Change Tues/Thurs/Sat. Will not follow at this time.  Please re-consult if needed.  Domenic Moras RN BSN [place] Pager [phone]

## 2015-06-19 NOTE — Progress Notes (Signed)
[facility] NOTE  Pharmacy Consult for Electrolytes    Allergies  Allergen Reactions  . Penicillins Nausea And Vomiting    Patient Measurements: Height: 5\' 8"  (172.7 cm) Weight: 262 lb 9.1 oz (119.1 kg) IBW/kg (Calculated) : 68.4   Vital Signs: Temp: 96.6 F (35.9 C) (08/23 1800) Temp Source: Core (Comment) (08/23 1624) BP: 152/101 mmHg (08/23 1800) Pulse Rate: 71 (08/23 1800) Intake/Output from previous day: 08/22 0701 - 08/23 0700 In: 1489.2 [I.V.:636.7; NG/GT:352.5; IV Piggyback:500] Out: 3327 [Urine:20; Stool:100] Intake/Output from this shift: Total I/O In: 1042.6 [I.V.:389.4; Other:40; NG/GT:163.3; IV Piggyback:450] Out: 0867 [Urine:75; Other:1770] Vent settings for last 24 hours: Vent Mode:  [-] PRVC FiO2 (%):  [35 %] 35 % Set Rate:  [24 bmp] 24 bmp Vt Set:  [500 mL] 500 mL PEEP:  [5 cmH20] 5 cmH20  Labs:  Recent Labs  06/17/15 0423  06/18/15 0408 06/18/15 0814  06/19/15 0036 06/19/15 0919 06/19/15 1700  WBC 5.3  --  5.1  --   --   --   --   --   HGB 10.8*  --  10.9*  --   --   --   --   --   HCT 33.9*  --  33.4*  --   --   --   --   --   PLT 33*  --  34*  --   --   --   --   --   CREATININE 1.23  < > 1.20 1.23  < > 1.21 1.31* 1.12  MG 1.7  < >  --  1.7  1.8  --  1.8 1.8 1.8  PHOS 2.2*  < >  --  2.2*  < > 2.1* 2.8 2.2*  ALBUMIN 2.3*  < >  --  2.3*  < > 2.9* 3.1* 3.1*  AST  --   --   --  30  --   --   --   --   ALT  --   --   --  23  --   --   --   --   < > = values in this interval not displayed. Estimated Creatinine Clearance: 85.8 mL/min (by C-G formula based on Cr of 1.12).   Recent Labs  06/19/15 0409 06/19/15 0724 06/19/15 1548  GLUCAP 120* 109* 127*   BMP Latest Ref Rng 06/19/2015 06/19/2015 06/19/2015  Glucose 65 - 99 mg/dL 124(H) 127(H) 115(H)  BUN 6 - 20 mg/dL 16 17 14   Creatinine 0.61 - 1.24 mg/dL 1.12 1.31(H) 1.21  Sodium 135 - 145 mmol/L 138 138 136  Potassium 3.5 - 5.1 mmol/L 4.8 4.9 4.7  Chloride 101 -  111 mmol/L 104 105 103  CO2 22 - 32 mmol/L 26 28 26   Calcium 8.9 - 10.3 mg/dL 9.0 8.6(L) 8.6(L)     Microbiology: Recent Results (from the past 720 hour(s))  Blood Culture (routine x 2)     Status: None   Collection Time: 06/10/15  2:35 PM  Result Value Ref Range Status   Specimen Description BLOOD RIGHT WRIST  Final   Special Requests BOTTLES DRAWN AEROBIC AND ANAEROBIC  1CC  Final   Culture NO GROWTH 5 DAYS  Final   Report Status 06/15/2015 FINAL  Final  Blood Culture (routine x 2)     Status: None   Collection Time: 06/10/15  2:40 PM  Result Value Ref Range Status   Specimen Description BLOOD LEFT ASSIST CONTROL  Final  Special Requests   Final    BOTTLES DRAWN AEROBIC AND ANAEROBIC  AER 6CC ANA 4CC   Culture NO GROWTH 5 DAYS  Final   Report Status 06/15/2015 FINAL  Final  Urine culture     Status: None   Collection Time: 06/10/15  4:42 PM  Result Value Ref Range Status   Specimen Description URINE, RANDOM  Final   Special Requests NONE  Final   Culture 4,000 COLONIES/mL INSIGNIFICANT GROWTH  Final   Report Status 06/12/2015 FINAL  Final  MRSA PCR Screening     Status: None   Collection Time: 06/10/15  6:27 PM  Result Value Ref Range Status   MRSA by PCR NEGATIVE NEGATIVE Final    Comment:        The GeneXpert MRSA Assay (FDA approved for NASAL specimens only), is one component of a comprehensive MRSA colonization surveillance program. It is not intended to diagnose MRSA infection nor to guide or monitor treatment for MRSA infections.   Culture, sputum-assessment     Status: None   Collection Time: 06/11/15  2:50 PM  Result Value Ref Range Status   Specimen Description SPUTUM  Final   Special Requests NONE  Final   Sputum evaluation THIS SPECIMEN IS ACCEPTABLE FOR SPUTUM CULTURE  Final   Report Status 06/13/2015 FINAL  Final  Culture, respiratory (NON-Expectorated)     Status: None   Collection Time: 06/11/15  2:50 PM  Result Value Ref Range Status    Specimen Description SPUTUM  Final   Special Requests NONE Reflexed from [id number]  Final   Gram Stain   Final    MODERATE WBC SEEN RARE GRAM POSITIVE COCCI GOOD SPECIMEN - 80-90% WBCS    Culture MODERATE GROWTH STAPHYLOCOCCUS AUREUS  Final   Report Status 06/14/2015 FINAL  Final   Organism ID, Bacteria STAPHYLOCOCCUS AUREUS  Final      Susceptibility   Staphylococcus aureus - MIC*    CIPROFLOXACIN <=0.5 SENSITIVE Sensitive     GENTAMICIN <=0.5 SENSITIVE Sensitive     OXACILLIN 0.5 SENSITIVE Sensitive     TRIMETH/SULFA <=10 SENSITIVE Sensitive     CEFOXITIN SCREEN NEGATIVE Sensitive     Inducible Clindamycin NEGATIVE Sensitive     TETRACYCLINE Value in next row Sensitive      SENSITIVE<=1    * MODERATE GROWTH STAPHYLOCOCCUS AUREUS  Wound culture     Status: None   Collection Time: 06/13/15  1:56 PM  Result Value Ref Range Status   Specimen Description LEG  Final   Special Requests Normal  Final   Gram Stain   Final    FEW WBC SEEN MODERATE GRAM NEGATIVE RODS FEW GRAM POSITIVE COCCI    Culture   Final    LIGHT GROWTH STAPHYLOCOCCUS AUREUS RARE GROWTH PROTEUS PENNERI RARE GROWTH ENTEROCOCCUS FAECALIS    Report Status 06/17/2015 FINAL  Final   Organism ID, Bacteria STAPHYLOCOCCUS AUREUS  Final   Organism ID, Bacteria PROTEUS PENNERI  Final   Organism ID, Bacteria ENTEROCOCCUS FAECALIS  Final      Susceptibility   Staphylococcus aureus - MIC*    CIPROFLOXACIN <=0.5 SENSITIVE Sensitive     GENTAMICIN <=0.5 SENSITIVE Sensitive     OXACILLIN 0.5 SENSITIVE Sensitive     TRIMETH/SULFA <=10 SENSITIVE Sensitive     CEFOXITIN SCREEN NEGATIVE Sensitive     Inducible Clindamycin NEGATIVE Sensitive     TETRACYCLINE Value in next row Sensitive      SENSITIVE<=1    * LIGHT GROWTH  STAPHYLOCOCCUS AUREUS   Proteus penneri - MIC*    AMPICILLIN Value in next row Resistant      SENSITIVE<=1    CEFAZOLIN Value in next row Resistant      SENSITIVE<=1    CEFTRIAXONE Value in next row  Sensitive      SENSITIVE<=1    CIPROFLOXACIN Value in next row Sensitive      SENSITIVE<=1    GENTAMICIN Value in next row Sensitive      SENSITIVE<=1    IMIPENEM Value in next row Sensitive      SENSITIVE<=1    NITROFURANTOIN Value in next row Resistant      SENSITIVE<=1    TRIMETH/SULFA Value in next row Sensitive      SENSITIVE<=1    PIP/TAZO Value in next row Sensitive      SENSITIVE<=4    * RARE GROWTH PROTEUS PENNERI   Enterococcus faecalis - MIC*    AMPICILLIN Value in next row Sensitive      SENSITIVE<=4    LINEZOLID Value in next row Sensitive      SENSITIVE<=4    * RARE GROWTH ENTEROCOCCUS FAECALIS  Wound culture     Status: None   Collection Time: 06/13/15  1:58 PM  Result Value Ref Range Status   Specimen Description LEG  Final   Special Requests Normal  Final   Gram Stain RARE WBC SEEN FEW GRAM NEGATIVE RODS   Final   Culture NO GROWTH 4 DAYS  Final   Report Status 06/17/2015 FINAL  Final    Medications:  Scheduled:  . albumin human  12.5 g Intravenous Q6H  . amiodarone  400 mg Oral BID  . ampicillin-sulbactam (UNASYN) IV  3 g Intravenous Q8H  . antiseptic oral rinse  7 mL Mouth Rinse QID  . budesonide (PULMICORT) nebulizer solution  0.5 mg Nebulization BID  . chlorhexidine gluconate  15 mL Mouth Rinse BID  . ciprofloxacin  400 mg Intravenous Q12H  . clonazePAM  0.5 mg Oral BID  . [START ON 06/20/2015] famotidine  20 mg Oral Q48H  . feeding supplement (PRO-STAT SUGAR FREE 64)  30 mL Oral TID  . ipratropium-albuterol  3 mL Nebulization Q6H    Assessment: 64 y/o M ventilated, sedated on CRRT. Electrolytes are wnl except low phos.  Phos 2.8 --> 2.2   Plan:  Will order Sodium Phos 15 mmol x1 Labs already ordered   Rayna Sexton, PharmD, BCPS Clinical Pharmacist 06/19/2015 6:57 PM

## 2015-06-19 NOTE — Progress Notes (Signed)
[facility] Progess Note     ASSESSMENAT/PLAN   64 yo white male admitted to ICU for acute  hypercapnic and hypoxic resp failure from acute CHF exacerbation with acute liver dysfunction/liver failure with acute renal failure with acute encephalopathy with mixed resp and metabolic acidosis with recurrent resp failure. Pt grew MSSA in sputum, suspect pneumonia.  Pt failed extubation and reintubated. Now becoming more awake  PULMONARY 1.Respiratory Failure-patient reintubated  due to worsening cardio-renal syndrome - patient placed on pressure support this morning, but became anxious with vent dyssynchrony, sedation tried, but not successful, will continue to reassess for extubation, but very difficult at this time.  -MSSA in sputum. Possible pneumonia, on antibiotics.  Enterococcus on resp cx, started on unasyn -continue Bronchodilator Therapy -Wean Fio2 and PEEP as tolerated -his respiratory status is tenuous giving his other comorbities, his risk of reintubation is high.  - cont with even I/O status -check abg  CARDIOVASCULAR ACUTE CHF, chronic atrial fibrillation with RVR, now controlled. Continue amiodarone via tube, and restart IV if his heart rate goes back up. -History of systolic congestive heart failure with an ejection fraction of 35% -intubated -continue vent support  RENAL Renal Failure-most likely due to ATN, currently on CRRT -follow chem 7 -follow UO -continue Foley Catheter-assess need -vasc cath placed for CRRT-nephrology consulted -placed on intermittent albumin by nephro   GASTROINTESTINAL GI Prophylaxic -OG placed-on TF's -Patient now noted to have ileus on abdominal x-ray tube feeds have been turned off. - U/S of RUL with GB sludge but no fluid noted, no leukocytosis - KUB with no acute findings today - hx of ? Liver dysfunction, ammonia 24  HEMATOLOGIC Follow h/h  INFECTIOUS ?cellulitis of LE-wound culture positive for  multiple organisms including E faecalis MSSA, and Proteus. -on unasyn -Sputum culture positive for MSSA. Continue unasyn for MSSA pneumonia, ID following.   ENDOCRINE - ICU hypoglycemic\Hyperglycemia protocol   NEUROLOGIC - intubated and sedated - minimal sedation to achieve a RASS goal: -1     -Patient was discussed with the critical care RN. Continue current vent support. Patient will attempt sedation vacation again, vent wean as tolerated, cont with crrt. - palliative to reassess again tomorrow.   -DVT prophylaxis, on heparin -Gi Prophylaxis, on tube feeds, Famotidine.  ----------------------------------------   Name: Aaron Harrison MRN: [id number] DOB: April 20, 1951    ADMISSION DATE:  06/10/2015  SUBJECTIVE:   Pt currently on the ventilator, following simple commands, but when placed on PSV mode, has inc RR and tachycardia.   Review of Systems:  Pt currently on the ventilator, can not provide history or review of systems.    VITAL SIGNS: Temp:  [96.4 F (35.8 C)-97.3 F (36.3 C)] 96.4 F (35.8 C) (08/23 1200) Pulse Rate:  [65-94] 86 (08/23 1200) Resp:  [17-25] 24 (08/23 1200) BP: (90-151)/(65-127) 141/127 mmHg (08/23 1200) SpO2:  [93 %-100 %] 96 % (08/23 1200) FiO2 (%):  [35 %] 35 % (08/23 1135) Weight:  [262 lb 9.1 oz (119.1 kg)] 262 lb 9.1 oz (119.1 kg) (08/23 0400) HEMODYNAMICS:   VENTILATOR SETTINGS: Vent Mode:  [-] PRVC FiO2 (%):  [35 %] 35 % Set Rate:  [24 bmp] 24 bmp Vt Set:  [500 mL] 500 mL PEEP:  [5 cmH20] 5 cmH20 Pressure Support:  [18 cmH20] 18 cmH20 Plateau Pressure:  [11 cmH20] 11 cmH20 INTAKE / OUTPUT:  Intake/Output Summary (Last 24 hours) at 06/19/15 1233 Last data filed at 06/19/15 1121  Gross per 24 hour  Intake 1651.21 ml  Output   2579 ml  Net -927.79 ml    PHYSICAL EXAMINATION: Physical Examination:   VS: BP 141/127 mmHg  Pulse 86  Temp(Src) 96.4 F (35.8 C) (Core (Comment))  Resp 24  Ht 5\' 8"  (1.727 m)  Wt 262 lb 9.1 oz  (119.1 kg)  BMI 39.93 kg/m2  SpO2 96%  General Appearance: No distress  Neuro:without focal findings, mental status normal. HEENT: PERRLA, EOM intact. Pulmonary: coarse upper airway sounds, on the MV.   CardiovascularNormal S1,S2.  No m/r/g.   Abdomen: Benign, Soft, non-tender. Renal:  No costovertebral tenderness  GU:  Not performed at this time. Endocrine: No evident thyromegaly. Skin:   Warm , anterior LE B/L skin wounds (clean based, no purulent drainage).  Extremities: normal, no cyanosis, clubbing.   LABS:  CBC  Recent Labs Lab 06/16/15 0006 06/17/15 0423 06/18/15 0408  WBC 5.4 5.3 5.1  HGB 11.0* 10.8* 10.9*  HCT 34.3* 33.9* 33.4*  PLT 36* 33* 34*   Coag's No results for input(s): APTT, INR in the last 168 hours. BMET  Recent Labs Lab 06/18/15 1611 06/19/15 0036 06/19/15 0919  NA 135 136 138  K 4.5 4.7 4.9  CL 102 103 105  CO2 26 26 28   BUN 14 14 17   CREATININE 1.22 1.21 1.31*  GLUCOSE 139* 115* 127*   Electrolytes  Recent Labs Lab 06/18/15 0814 06/18/15 1611 06/19/15 0036 06/19/15 0919  CALCIUM 8.3* 8.3* 8.6* 8.6*  MG 1.7  1.8  --  1.8 1.8  PHOS 2.2* 2.2* 2.1* 2.8   Sepsis Markers No results for input(s): LATICACIDVEN, PROCALCITON, O2SATVEN in the last 168 hours. ABG  Recent Labs Lab 06/14/15 0915 06/17/15 0408 06/18/15 0500  PHART 7.37 7.38 7.41  PCO2ART 44 48 44  PO2ART 81* 75* 76*   Liver Enzymes  Recent Labs Lab 06/18/15 0814 06/18/15 1611 06/19/15 0036 06/19/15 0919  AST 30  --   --   --   ALT 23  --   --   --   ALBUMIN 2.3* 2.7* 2.9* 3.1*   Cardiac Enzymes No results for input(s): TROPONINI, PROBNP in the last 168 hours. Glucose  Recent Labs Lab 06/18/15 1609 06/18/15 1834 06/18/15 1951 06/18/15 2355 06/19/15 0409 06/19/15 0724  GLUCAP 116* 103* 108* 129* 120* 109*     No results found.   I have personally obtained a history, examined the patient, evaluated laboratory and imaging results, formulated  the assessment and plan and placed orders. CRITICAL CARE: The patient is critically ill with multiple organ systems failure and requires high complexity decision making for assessment and support, frequent evaluation and titration of therapies, application of advanced monitoring technologies and extensive interpretation of multiple databases. Critical Care Time devoted to patient care services described in this note is 35 minutes.    Vilinda Boehringer, MD [facility] Pager [phone] (Please enter 7-digits)

## 2015-06-19 NOTE — Progress Notes (Signed)
Nutrition Follow-up    INTERVENTION:   Coordination of Care: discussed possibility of addition of pro-motility agent such as reglan or erythromycin due to persistent residuals >500 mL and no acute findings on abdominal xray with MD Mungal during ICU rounds; MD plans to reassess this afternoon EN: recommend continuing TF as tolerated as per order set. Recommend increasing Prostat frequency to TID to maximize protein intake  NUTRITION DIAGNOSIS:   Inadequate oral intake related to acute illness as evidenced by NPO status.  GOAL:   Provide needs based on ASPEN/SCCM guidelines   MONITOR:    (Energy Intake, EN, Digestive System, Electrolyte/Renal Profile, Glucose Profile)   ASSESSMENT:    Pt remains on vent, on CRRT, UF increased to 200 ml/hr this AM, continues with massive edema   Diet Order:  Diet NPO time specified   EN: TF restarted yesterday at rate of 30 ml/hr, residual >500 mL last night, TF decreased to 15 ml/hr per protocol.   Digestive System: residuals 520-530 mL, flexiseal with 100 mL, abdominal xray yesterday with no acute findings, US abdomen with GB sludge but no fluid  Skin:  Reviewed, no issues   Electrolyte/Renal Profile:   Recent Labs Lab 06/18/15 0814 06/18/15 1611 06/19/15 0036 06/19/15 0919  NA 138 135 136 138  K 4.5 4.5 4.7 4.9  CL 105 102 103 105  CO2 28 26 26 28   BUN 14 14 14 17   CREATININE 1.23 1.22 1.21 1.31*  CALCIUM 8.3* 8.3* 8.6* 8.6*  MG 1.7  1.8  --  1.8 1.8  PHOS 2.2* 2.2* 2.1* 2.8  GLUCOSE 93 139* 115* 127*   Glucose Profile:  Recent Labs  06/18/15 2355 06/19/15 0409 06/19/15 0724  GLUCAP 129* 120* 109*   Protein Profile:  Recent Labs Lab 06/18/15 1611 06/19/15 0036 06/19/15 0919  ALBUMIN 2.7* 2.9* 3.1*   Lipid Profile:     Component Value Date/Time   TRIG 108 06/16/2015 2033     Nutritional Anemia Profile:  CBC Latest Ref Rng 06/18/2015 06/17/2015 06/16/2015  WBC 3.8 - 10.6 K/uL 5.1 5.3 5.4  Hemoglobin 13.0 -  18.0 g/dL 10.9(L) 10.8(L) 11.0(L)  Hematocrit 40.0 - 52.0 % 33.4(L) 33.9(L) 34.3(L)  Platelets 150 - 440 K/uL 34(L) 33(L) 36(L)    Gastrointestinal Profile: ammonia wdl  Meds: IV human albumin, precedex   Height:   Ht Readings from Last 1 Encounters:  06/13/15 5\' 8"  (1.727 m)    Weight:   Wt Readings from Last 1 Encounters:  06/19/15 262 lb 9.1 oz (119.1 kg)   BMI:  Body mass index is 39.93 kg/(m^2).  Estimated Nutritional Needs:   Kcal:  9147-8295 kcals (11-14 kcals/kg)   Protein:  140-175 g (2.0-2.5 g/kg)   Fluid:  1750-2100 mL (25-30 ml/kg)   HIGH Care Level  Kerman Passey MS, RD, LDN [phone] Pager

## 2015-06-20 ENCOUNTER — Inpatient Hospital Stay: Payer: Medicaid Other

## 2015-06-20 DIAGNOSIS — R188 Other ascites: Secondary | ICD-10-CM | POA: Diagnosis not present

## 2015-06-20 DIAGNOSIS — I5021 Acute systolic (congestive) heart failure: Secondary | ICD-10-CM | POA: Diagnosis not present

## 2015-06-20 DIAGNOSIS — N179 Acute kidney failure, unspecified: Secondary | ICD-10-CM | POA: Diagnosis not present

## 2015-06-20 DIAGNOSIS — J9601 Acute respiratory failure with hypoxia: Secondary | ICD-10-CM | POA: Diagnosis not present

## 2015-06-20 LAB — COMPREHENSIVE METABOLIC PANEL
ALT: 19 U/L (ref 17–63)
ANION GAP: 6 (ref 5–15)
AST: 23 U/L (ref 15–41)
Albumin: 3.5 g/dL (ref 3.5–5.0)
Alkaline Phosphatase: 53 U/L (ref 38–126)
BILIRUBIN TOTAL: 3.3 mg/dL — AB (ref 0.3–1.2)
BUN: 17 mg/dL (ref 6–20)
CHLORIDE: 105 mmol/L (ref 101–111)
CO2: 27 mmol/L (ref 22–32)
Calcium: 8.9 mg/dL (ref 8.9–10.3)
Creatinine, Ser: 1.11 mg/dL (ref 0.61–1.24)
Glucose, Bld: 130 mg/dL — ABNORMAL HIGH (ref 65–99)
POTASSIUM: 4.7 mmol/L (ref 3.5–5.1)
Sodium: 138 mmol/L (ref 135–145)
TOTAL PROTEIN: 6.5 g/dL (ref 6.5–8.1)

## 2015-06-20 LAB — RENAL FUNCTION PANEL
ALBUMIN: 3.1 g/dL — AB (ref 3.5–5.0)
Albumin: 3.3 g/dL — ABNORMAL LOW (ref 3.5–5.0)
Anion gap: 6 (ref 5–15)
Anion gap: 9 (ref 5–15)
BUN: 16 mg/dL (ref 6–20)
BUN: 18 mg/dL (ref 6–20)
CALCIUM: 8.6 mg/dL — AB (ref 8.9–10.3)
CHLORIDE: 106 mmol/L (ref 101–111)
CO2: 25 mmol/L (ref 22–32)
CO2: 26 mmol/L (ref 22–32)
CREATININE: 1.24 mg/dL (ref 0.61–1.24)
Calcium: 9.1 mg/dL (ref 8.9–10.3)
Chloride: 104 mmol/L (ref 101–111)
Creatinine, Ser: 1.18 mg/dL (ref 0.61–1.24)
Glucose, Bld: 137 mg/dL — ABNORMAL HIGH (ref 65–99)
Glucose, Bld: 147 mg/dL — ABNORMAL HIGH (ref 65–99)
PHOSPHORUS: 2.8 mg/dL (ref 2.5–4.6)
POTASSIUM: 4.5 mmol/L (ref 3.5–5.1)
Phosphorus: 2.2 mg/dL — ABNORMAL LOW (ref 2.5–4.6)
Potassium: 4.7 mmol/L (ref 3.5–5.1)
Sodium: 136 mmol/L (ref 135–145)
Sodium: 140 mmol/L (ref 135–145)

## 2015-06-20 LAB — GLUCOSE, CAPILLARY
GLUCOSE-CAPILLARY: 125 mg/dL — AB (ref 65–99)
GLUCOSE-CAPILLARY: 136 mg/dL — AB (ref 65–99)
GLUCOSE-CAPILLARY: 137 mg/dL — AB (ref 65–99)
Glucose-Capillary: 114 mg/dL — ABNORMAL HIGH (ref 65–99)
Glucose-Capillary: 131 mg/dL — ABNORMAL HIGH (ref 65–99)
Glucose-Capillary: 111 mg/dL — ABNORMAL HIGH (ref 65–99)

## 2015-06-20 LAB — CBC
HEMATOCRIT: 37 % — AB (ref 40.0–52.0)
Hemoglobin: 12.3 g/dL — ABNORMAL LOW (ref 13.0–18.0)
MCH: 34 pg (ref 26.0–34.0)
MCHC: 33.3 g/dL (ref 32.0–36.0)
MCV: 102.3 fL — ABNORMAL HIGH (ref 80.0–100.0)
Platelets: 40 10*3/uL — ABNORMAL LOW (ref 150–440)
RBC: 3.62 MIL/uL — AB (ref 4.40–5.90)
RDW: 16.9 % — AB (ref 11.5–14.5)
WBC: 8.4 10*3/uL (ref 3.8–10.6)

## 2015-06-20 LAB — BLOOD GAS, ARTERIAL
ACID-BASE EXCESS: 1.7 mmol/L (ref 0.0–3.0)
Bicarbonate: 25.8 mEq/L (ref 21.0–28.0)
FIO2: 0.35
Mechanical Rate: 24
O2 SAT: 96.8 %
PATIENT TEMPERATURE: 37
PCO2 ART: 38 mmHg (ref 32.0–48.0)
PEEP: 5 cmH2O
PH ART: 7.44 (ref 7.350–7.450)
PO2 ART: 85 mmHg (ref 83.0–108.0)
VT: 500 mL

## 2015-06-20 LAB — MAGNESIUM
MAGNESIUM: 1.7 mg/dL (ref 1.7–2.4)
MAGNESIUM: 1.7 mg/dL (ref 1.7–2.4)
Magnesium: 1 mg/dL — ABNORMAL LOW (ref 1.7–2.4)

## 2015-06-20 MED ORDER — MAGNESIUM SULFATE 4 GM/100ML IV SOLN: 4.0000 g | Freq: Once | INTRAVENOUS | Status: DC

## 2015-06-20 MED ORDER — MAGNESIUM SULFATE 4 GM/100ML IV SOLN
4.0000 g | Freq: Once | INTRAVENOUS | Status: DC
  Administered 2015-06-20: 4 g via INTRAVENOUS
  Filled 2015-06-20: qty 100

## 2015-06-20 MED ORDER — VITAL HIGH PROTEIN PO LIQD
1000.0000 mL | ORAL | Status: DC
  Administered 2015-06-20: 1000 mL

## 2015-06-20 MED ORDER — METOPROLOL TARTRATE 25 MG PO TABS
25.0000 mg | ORAL_TABLET | Freq: Four times a day (QID) | ORAL | Status: DC
  Administered 2015-06-20 – 2015-06-24 (×12): 25 mg via ORAL
  Filled 2015-06-20 (×13): qty 1

## 2015-06-20 MED ORDER — CLONAZEPAM 0.5 MG PO TABS
0.2500 mg | ORAL_TABLET | Freq: Two times a day (BID) | ORAL | Status: DC
  Administered 2015-06-20 – 2015-06-21 (×3): 0.25 mg via ORAL
  Filled 2015-06-20 (×4): qty 1

## 2015-06-20 MED ORDER — IPRATROPIUM-ALBUTEROL 0.5-2.5 (3) MG/3ML IN SOLN: 3.0000 mL | Freq: Four times a day (QID) | RESPIRATORY_TRACT | Status: DC | PRN

## 2015-06-20 MED ORDER — METOPROLOL TARTRATE 25 MG PO TABS
12.5000 mg | ORAL_TABLET | Freq: Two times a day (BID) | ORAL | Status: DC
  Administered 2015-06-20: 12.5 mg via ORAL
  Filled 2015-06-20: qty 0.5

## 2015-06-20 MED ORDER — SENNOSIDES-DOCUSATE SODIUM 8.6-50 MG PO TABS: 1.0000 | ORAL_TABLET | Freq: Two times a day (BID) | ORAL | Status: DC | PRN

## 2015-06-20 NOTE — Progress Notes (Signed)
Precedex level was decreased during the night.  Pt became more alert, was able to answer yes or no questions appropriately.  When pt asked was he in pain, pt shook his head no.  But quickly became very agitated. Pt reached for the tube.  His bp increased, as well as heart rate.  Precedex had to be returned to prior level to relax pt and keep him safe.  Pt is wearing mittens to protect him from attempting to pull his tube.

## 2015-06-20 NOTE — Progress Notes (Signed)
[facility] Progess Note     ASSESSMENAT/PLAN   64 yo white male admitted to ICU for acute  hypercapnic and hypoxic resp failure from acute CHF exacerbation with acute liver dysfunction/liver failure with acute renal failure with acute encephalopathy with mixed resp and metabolic acidosis with recurrent resp failure. Pt grew MSSA in sputum, suspect pneumonia.  Pt failed extubation and reintubated. Now becoming more awake but still with difficult wean due to mentation and vent dependence  PULMONARY 1.Respiratory Failure-patient reintubated  due to worsening cardio-renal syndrome - patient placed on pressure support this morning, but became anxious with vent dyssynchrony, sedation tried, but not successful, will continue to reassess for extubation, but very difficult at this time.  - limit precedex to 1.0, cont with prn fentanyl, dec klonopin to 0.25mg  bid --> hopefully this combination will assist with his mentation and ability to be liberated from the vent.  -MSSA in sputum. Possible pneumonia, on antibiotics.  Enterococcus on resp cx, started on unasyn -continue Bronchodilator Therapy -Wean Fio2 and PEEP as tolerated -his respiratory status is tenuous giving his other comorbities, his risk of reintubation is high.  - cont with even I/O status -check abg prn  CARDIOVASCULAR ACUTE CHF, chronic atrial fibrillation with RVR, now controlled.  -History of systolic congestive heart failure with an ejection fraction of 35% -intubated -continue vent support -history of afib - started on IV amiodarone due to BP issues, now with stable BP, will speak to cardiology about transition to PO meds.  - continue to have massive edema  RENAL Renal Failure-most likely due to ATN, currently on CRRT -follow chem 7 -follow UO -continue Foley Catheter-assess need -vasc cath placed for CRRT-nephrology consulted - continue to have generalized edema  -placed on intermittent  albumin by nephro   GASTROINTESTINAL GI Prophylaxic -OG placed-on TF's -Patient now noted to have ileus on abdominal x-ray previouly, xray on 8/22 with no ilues - U/S of RUL with GB sludge but no fluid noted, no leukocytosis - hx of ? Liver dysfunction, ammonia 24 -   HEMATOLOGIC Follow h/h  INFECTIOUS ?cellulitis of LE-wound culture positive for multiple organisms including E faecalis MSSA, and Proteus. -on unasyn -Sputum culture positive for MSSA. Continue unasyn for MSSA pneumonia, ID following.   ENDOCRINE - ICU hypoglycemic\Hyperglycemia protocol   NEUROLOGIC - intubated and sedated - minimal sedation to achieve a RASS goal: -1     -Patient was discussed with the critical care RN. Continue current vent support. Patient will attempt sedation vacation again, vent wean as tolerated, cont with crrt. - palliative to reassess again tomorrow.   -DVT prophylaxis, on heparin -Gi Prophylaxis, on tube feeds, Famotidine.  ----------------------------------------   Name: KIMO BANCROFT MRN: [id number] DOB: July 11, 1951    ADMISSION DATE:  06/10/2015  SUBJECTIVE:   Pt currently on the ventilator, following simple commands, more alert this morning.   Review of Systems:  Pt currently on the ventilator, can not provide history or review of systems.    VITAL SIGNS: Temp:  [96.1 F (35.6 C)-98.4 F (36.9 C)] 98.4 F (36.9 C) (08/24 0600) Pulse Rate:  [71-89] 79 (08/24 0700) Resp:  [22-27] 24 (08/24 0700) BP: (125-158)/(86-127) 148/87 mmHg (08/24 0700) SpO2:  [95 %-100 %] 100 % (08/24 0700) FiO2 (%):  [35 %] 35 % (08/24 0835) Weight:  [257 lb 11.5 oz (116.9 kg)] 257 lb 11.5 oz (116.9 kg) (08/24 0326) HEMODYNAMICS:   VENTILATOR SETTINGS: Vent Mode:  [-] PRVC FiO2 (%):  [35 %]  35 % Set Rate:  [24 bmp] 24 bmp Vt Set:  [500 mL] 500 mL PEEP:  [5 cmH20] 5 cmH20 INTAKE / OUTPUT:  Intake/Output Summary (Last 24 hours) at 06/20/15 0935 Last data filed at 06/20/15 0800   Gross per 24 hour  Intake 2824.46 ml  Output   4323 ml  Net -1498.54 ml    PHYSICAL EXAMINATION: Physical Examination:   VS: BP 148/87 mmHg  Pulse 79  Temp(Src) 98.4 F (36.9 C) (Other (Comment))  Resp 24  Ht 5\' 8"  (1.727 m)  Wt 257 lb 11.5 oz (116.9 kg)  BMI 39.19 kg/m2  SpO2 100%  General Appearance: No distress  Neuro:without focal findings, mental status normal. HEENT: PERRLA, EOM intact. Pulmonary: coarse upper airway sounds, on the MV.   CardiovascularNormal S1,S2.  No m/r/g.   Abdomen: Benign, Soft, non-tender. Renal:  No costovertebral tenderness  GU:  Not performed at this time. Endocrine: No evident thyromegaly. Skin:   Warm , anterior LE B/L skin wounds (clean based, no purulent drainage).  Extremities: normal, no cyanosis, clubbing.   LABS:  CBC  Recent Labs Lab 06/17/15 0423 06/18/15 0408 06/20/15 0439  WBC 5.3 5.1 8.4  HGB 10.8* 10.9* 12.3*  HCT 33.9* 33.4* 37.0*  PLT 33* 34* 40*   Coag's No results for input(s): APTT, INR in the last 168 hours. BMET  Recent Labs Lab 06/19/15 1700 06/19/15 2357 06/20/15 0439  NA 138 136 138  K 4.8 4.7 4.7  CL 104 104 105  CO2 26 26 27   BUN 16 16 17   CREATININE 1.12 1.24 1.11  GLUCOSE 124* 147* 130*   Electrolytes  Recent Labs Lab 06/19/15 0919 06/19/15 1700 06/19/15 2357 06/20/15 0439 06/20/15 0825  CALCIUM 8.6* 9.0 8.6* 8.9  --   MG 1.8 1.8 1.7  --  1.0*  PHOS 2.8 2.2* 2.8  --   --    Sepsis Markers No results for input(s): LATICACIDVEN, PROCALCITON, O2SATVEN in the last 168 hours. ABG  Recent Labs Lab 06/17/15 0408 06/18/15 0500 06/20/15 0424  PHART 7.38 7.41 7.44  PCO2ART 48 44 38  PO2ART 75* 76* 85   Liver Enzymes  Recent Labs Lab 06/18/15 0814  06/19/15 1700 06/19/15 2357 06/20/15 0439  AST 30  --   --   --  23  ALT 23  --   --   --  19  ALKPHOS  --   --   --   --  53  BILITOT  --   --   --   --  3.3*  ALBUMIN 2.3*  < > 3.1* 3.1* 3.5  < > = values in this interval  not displayed. Cardiac Enzymes No results for input(s): TROPONINI, PROBNP in the last 168 hours. Glucose  Recent Labs Lab 06/19/15 0724 06/19/15 1548 06/19/15 1958 06/20/15 0002 06/20/15 0401 06/20/15 0733  GLUCAP 109* 127* 128* 137* 114* 125*     Dg Chest Port 1 View  06/20/2015   CLINICAL DATA:  Respiratory failure.  EXAM: PORTABLE CHEST - 1 VIEW  COMPARISON:  06/18/2015.  FINDINGS: Endotracheal tube, NG tube, left subclavian line in stable position. Cardiomegaly with progressive bilateral pulmonary alveolar infiltrates and pleural effusions noted. Findings consistent with progressive congestive heart failure. Bilateral pneumonia cannot be excluded. No pneumothorax.  IMPRESSION: 1. Lines and tubes in stable position. 2. Cardiomegaly with progressive bilateral pulmonary alveolar infiltrates and pleural effusions consistent congestive heart failure. Bilateral pneumonia cannot be excluded.   Electronically Signed   By: Marcello Moores  Register   On: 06/20/2015 07:19     I have personally obtained a history, examined the patient, evaluated laboratory and imaging results, formulated the assessment and plan and placed orders. CRITICAL CARE: The patient is critically ill with multiple organ systems failure and requires high complexity decision making for assessment and support, frequent evaluation and titration of therapies, application of advanced monitoring technologies and extensive interpretation of multiple databases. Critical Care Time devoted to patient care services described in this note is 35 minutes.    Vilinda Boehringer, MD [facility] Pager [phone] (Please enter [phone]-[phone])

## 2015-06-20 NOTE — Progress Notes (Signed)
Pt off sedation since 8am- In pressure support mode since 11- now PS 12/5PEEP.  Pt tolerating well- Pt able to follow commands- Tolerating tube feeds-increased rate to 4ml/hr- urine output minimal.  CRRT ultrafiltration rate increased to 250.  Pt vitals stable- AFIB- now HR in 120's/130's- Dr. Fletcher Anon ordered to increase metoprolol 12.5 to 25mg  q6- EKG ordered- showing AFIB on monitor.

## 2015-06-20 NOTE — Progress Notes (Addendum)
Dr. Fletcher Anon made aware of 11 beat run of [facility]- no new orders. Per Dr. Juanell Fairly- Place patient back in a/c mode and let him rest tonight. Will perform sedation vacation in the am.

## 2015-06-20 NOTE — Progress Notes (Signed)
Pt et tube now intact at 24 at the lip.

## 2015-06-20 NOTE — Progress Notes (Signed)
Patient: Aaron Harrison / Admit Date: 06/10/2015 / Date of Encounter: 06/20/2015, 10:49 AM   Subjective: Asked to visit with the patient to address his Afib medication.  Intubated, eyes respond to verbal commands this morning.  Upon presentation to hospital patient with history of chronic Afib and was found to be in Afib with RVR and hypotensive requiring starting of IV amiodarone for rate control as he was in acute respiratory failure with sepsis and found to have acute on chronic systolic CHF. It was felt, give his cardiomyopathy, he would tolerate a better controlled heart rate with amiodarone as we were unable to use rate controlling agent such as BB or CCB 2/2 hypotension or digoxin given he was in acute renal failure. His heart rate responded quite well on amiodarone. Amiodarone gtt was continued until 8/20 in the setting of patient being extubated and re-intubated on 8/17. On 8/20 he was changed from amiodarone gtt to po amiodarone 400 mg bid. His BP has improved quite nicely. He remains in rate controlled Afib and intubated.     Review of Systems: Review of Systems  Unable to perform ROS: intubated     Objective: Telemetry: Afib, 80's to 90's Physical Exam: Blood pressure 147/94, pulse 81, temperature 98.2 F (36.8 C), temperature source Other (Comment), resp. rate 25, height 5\' 8"  (1.727 m), weight 257 lb 11.5 oz (116.9 kg), SpO2 95 %. Body mass index is 39.19 kg/(m^2). General: Well developed, well nourished, in no acute distress. Head: Normocephalic, atraumatic, sclera non-icteric, no xanthomas, nares are without discharge. Neck: Negative for carotid bruits. JVP not elevated. Lungs: Clear bilaterally to auscultation without wheezes, rales, or rhonchi. Breathing is unlabored. Heart: Irregularly-irregular, S1 S2 without murmurs, rubs, or gallops.  Abdomen: Soft, non-tender, non-distended with normoactive bowel sounds. No rebound/guarding. Extremities: No clubbing or cyanosis. 2+  bilateral lower extremity edema with cellulitis. Ecchymosis with petechiae.  Neuro: Intubated. Psych:  Intubated.   Intake/Output Summary (Last 24 hours) at 06/20/15 1049 Last data filed at 06/20/15 0900  Gross per 24 hour  Intake 2836.66 ml  Output   4515 ml  Net -1678.34 ml    Inpatient Medications:  . albumin human  12.5 g Intravenous Q6H  . amiodarone  400 mg Oral BID  . ampicillin-sulbactam (UNASYN) IV  3 g Intravenous Q8H  . antiseptic oral rinse  7 mL Mouth Rinse QID  . budesonide (PULMICORT) nebulizer solution  0.5 mg Nebulization BID  . chlorhexidine gluconate  15 mL Mouth Rinse BID  . ciprofloxacin  400 mg Intravenous Q12H  . clonazePAM  0.25 mg Oral BID  . famotidine  20 mg Oral Q48H  . feeding supplement (PRO-STAT SUGAR FREE 64)  30 mL Oral TID  . ipratropium-albuterol  3 mL Nebulization Q6H   Infusions:  . dexmedetomidine 0.393 mcg/kg/hr (06/20/15 0900)  . feeding supplement (VITAL HIGH PROTEIN)    . pureflow 2,500 mL/hr at 06/20/15 0853    Labs:  Recent Labs  06/19/15 1700 06/19/15 2357 06/20/15 0439 06/20/15 0825  NA 138 136 138  --   K 4.8 4.7 4.7  --   CL 104 104 105  --   CO2 26 26 27   --   GLUCOSE 124* 147* 130*  --   BUN 16 16 17   --   CREATININE 1.12 1.24 1.11  --   CALCIUM 9.0 8.6* 8.9  --   MG 1.8 1.7  --  1.0*  PHOS 2.2* 2.8  --   --  Recent Labs  06/18/15 0814  06/19/15 2357 06/20/15 0439  AST 30  --   --  23  ALT 23  --   --  19  ALKPHOS  --   --   --  53  BILITOT  --   --   --  3.3*  PROT  --   --   --  6.5  ALBUMIN 2.3*  < > 3.1* 3.5  < > = values in this interval not displayed.  Recent Labs  06/18/15 0408 06/20/15 0439  WBC 5.1 8.4  HGB 10.9* 12.3*  HCT 33.4* 37.0*  MCV 103.3* 102.3*  PLT 34* 40*   No results for input(s): CKTOTAL, CKMB, TROPONINI in the last 72 hours. Invalid input(s): POCBNP No results for input(s): HGBA1C in the last 72 hours.   Weights: Filed Weights   06/18/15 0500 06/19/15 0400  06/20/15 0326  Weight: 273 lb 13 oz (124.2 kg) 262 lb 9.1 oz (119.1 kg) 257 lb 11.5 oz (116.9 kg)     Radiology/Studies:  Dg Chest 1 View  06/18/2015   CLINICAL DATA:  Acute respiratory failure, community-acquired pneumonia, intubated patient.  EXAM: CHEST  1 VIEW  COMPARISON:  Portable chest x-ray of June 17, 2015  FINDINGS: The lungs are adequately inflated. The interstitial markings remain increased with areas of confluence noted particularly on the left. The retrocardiac region remains dense and the left hemidiaphragm remains obscured. The cardiac silhouette is enlarged. The endotracheal tube tip lies approximately 1.7 cm above the carina. The esophagogastric tube tip projects below the inferior margin of the image. The left subclavian venous catheter tip projects over the midportion of the SVC.  IMPRESSION: Cardiomegaly with mild interstitial prominence bilaterally. Alveolar edema or pneumonia on the left with retrocardiac atelectasis. The support apparatus is in stable position.   Electronically Signed   By: David  Martinique M.D.   On: 06/18/2015 07:39   Dg Chest 1 View  06/17/2015   CLINICAL DATA:  Patient with acute respiratory failure and community acquired pneumonia.  EXAM: CHEST  1 VIEW  COMPARISON:  Chest radiograph 06/15/2015  FINDINGS: ET tube terminates in the distal trachea. Left subclavian central venous catheter tip projects over the superior vena cava. Enteric tube courses inferior to the diaphragm. Multiple additional monitoring leads overlie the patient. Stable cardiomegaly. Low lung volumes. Diffuse bilateral interstitial pulmonary opacities. Small bilateral pleural effusions. No definite pneumothorax.  IMPRESSION: Stable support apparatus.  Cardiomegaly and mild interstitial edema.   Electronically Signed   By: Lovey Newcomer M.D.   On: 06/17/2015 09:15   Dg Abd 1 View  06/18/2015   CLINICAL DATA:  Followup abdominal distension  EXAM: ABDOMEN - 1 VIEW  COMPARISON:  06/17/2015   FINDINGS: A right femoral dual lumen catheter is again seen and stable. Scattered large and small bowel gas is noted. No obstructive changes are seen. Bony structures show degenerative change of the lumbar spine.  IMPRESSION: No acute abnormality noted.   Electronically Signed   By: Inez Catalina M.D.   On: 06/18/2015 11:57   Dg Abd 1 View  06/17/2015   CLINICAL DATA:  Abdominal distention.  Sepsis.  Respiratory failure.  EXAM: ABDOMEN - 1 VIEW  COMPARISON:  06/13/2015  FINDINGS: Gaseous distention is seen involving the nondependent portions of the colon, mainly the transverse colon, consistent with adynamic ileus. Cecum measures approximately 9.4 cm in diameter. No dilated small bowel loops seen. Catheter is noted within the bladder. Right femoral venous catheter also noted.  IMPRESSION: Moderate colonic ileus.   Electronically Signed   By: Earle Gell M.D.   On: 06/17/2015 08:40   Dg Abd 1 View  06/13/2015   CLINICAL DATA:  Status post OG tube placement.  EXAM: ABDOMEN - 1 VIEW  COMPARISON:  Single view of the abdomen 06/10/2015.  FINDINGS: OG tube is in place with the tip in the distal stomach. The bowel gas pattern is unremarkable.  IMPRESSION: OG tube tip is in the distal stomach.   Electronically Signed   By: Inge Rise M.D.   On: 06/13/2015 11:32   Dg Abd 1 View  06/10/2015   CLINICAL DATA:  64 year old male enteric tube placement. Initial encounter.  EXAM: ABDOMEN - 1 VIEW  COMPARISON:  Portable chest radiograph 1556 hours today.  FINDINGS: Portable AP supine view at 1552 hours. There is no enteric tube visible in the abdomen. There is gaseous distension of the stomach. Non obstructed bowel gas pattern otherwise. On the recent portable chest tubing was looped over the lower neck.  IMPRESSION: 1. Strongly suspect the enteric tube is looped in the pharynx, visible only on the portable chest radiograph from 1556 hours. Recommend removal and replacement. 2. Gaseous distension of the stomach.    Electronically Signed   By: Genevie Ann M.D.   On: 06/10/2015 16:32   US Abdomen Complete  06/16/2015   CLINICAL DATA:  Patient with liver and kidney disease. Respiratory failure.  EXAM: ULTRASOUND ABDOMEN COMPLETE  COMPARISON:  None.  FINDINGS: Gallbladder: Probable sludge within the gallbladder lumen. No gallbladder wall thickening. No pericholecystic fluid. Unable to assess for sonographic Murphy's sign.  Common bile duct: Diameter: Approximately 3 mm  Liver: No focal lesion identified. Within normal limits in parenchymal echogenicity.  IVC: No abnormality visualized.  Pancreas: Not visualized due to overlying bowel gas and body habitus.  Spleen: Size and appearance within normal limits.  Right Kidney: Length: 14.2 cm. No hydronephrosis. There is a 7 cm cyst off the superior pole.  Left Kidney: Length: 12.4 cm. No hydronephrosis. There is a 2.2 cm hypoechoic lesion within the interpolar region of the left kidney, incompletely characterized.  Abdominal aorta: Not well visualized.  Other findings: None.  IMPRESSION: Possible sludge within the gallbladder lumen. No definite sonographic evidence to suggest acute cholecystitis.  No hydronephrosis.  Indeterminate hypoechoic mass within the interpolar region left kidney, potentially representing a complicated cyst. Solid mass is not excluded. Consider correlation with dedicated cross-sectional imaging when patient clinically able.   Electronically Signed   By: Lovey Newcomer M.D.   On: 06/16/2015 18:21   Dg Chest Port 1 View  06/20/2015   CLINICAL DATA:  Respiratory failure.  EXAM: PORTABLE CHEST - 1 VIEW  COMPARISON:  06/18/2015.  FINDINGS: Endotracheal tube, NG tube, left subclavian line in stable position. Cardiomegaly with progressive bilateral pulmonary alveolar infiltrates and pleural effusions noted. Findings consistent with progressive congestive heart failure. Bilateral pneumonia cannot be excluded. No pneumothorax.  IMPRESSION: 1. Lines and tubes in stable  position. 2. Cardiomegaly with progressive bilateral pulmonary alveolar infiltrates and pleural effusions consistent congestive heart failure. Bilateral pneumonia cannot be excluded.   Electronically Signed   By: Marcello Moores  Register   On: 06/20/2015 07:19   Dg Chest Port 1 View  06/15/2015   CLINICAL DATA:  Intubated patient, acute respiratory failure, community-acquired pneumonia, CHF.  EXAM: PORTABLE CHEST - 1 VIEW  COMPARISON:  Portable chest x-ray of June 13, 2015  FINDINGS: The lungs are mildly hypoinflated. The interstitial markings  are increased bilaterally. The retrocardiac region remains dense. There is no pneumothorax or significant pleural effusion. The cardiac silhouette remains enlarged. The pulmonary vascularity is engorged. The endotracheal tube tip lies 3.2 cm above the carina. The esophagogastric tube tip projects below the inferior margin of the image. The left subclavian venous catheter tip projects over the midportion of the SVC.  IMPRESSION: Slight interval improvement in pulmonary interstitial edema despite mild hypo inflation. The support tubes are in reasonable position.   Electronically Signed   By: David  Martinique M.D.   On: 06/15/2015 08:45   Dg Chest Port 1 View  06/13/2015   CLINICAL DATA:  Hypoxia  EXAM: PORTABLE CHEST - 1 VIEW  COMPARISON:  June 12, 2015  FINDINGS: Endotracheal tube tip is at the carina. Central catheter tip is in the superior cava. Nasogastric tube tip and side port are below the diaphragm. No pneumothorax. There is alveolar edema throughout much of the right lung. There is also alveolar edema in both lower lobes. Heart is enlarged with pulmonary venous hypertension. Suspect small layering effusion on the right. No adenopathy.  IMPRESSION: Tube and catheter positions as described without pneumothorax. Note that the endotracheal tube tip is at the carina.  Congestive heart failure with persistent areas of alveolar edema. Stable cardiomegaly. Suspect layering  effusion on the right.  Critical Value/emergent results were called by telephone at the time of interpretation on 06/13/2015 at 10:17 am to Delton See, RN , who verbally acknowledged these results.   Electronically Signed   By: Lowella Grip III M.D.   On: 06/13/2015 10:18   Dg Chest Port 1 View  06/12/2015   CLINICAL DATA:  Shortness of breath.  EXAM: PORTABLE CHEST - 1 VIEW  COMPARISON:  06/11/2015.  FINDINGS: Interim removal of endotracheal tube. Left subclavian line in good anatomic position. Cardiomegaly. Progressive bilateral pulmonary infiltrates. Small left pleural effusion. No pneumothorax.  IMPRESSION: 1. Interim removal of endotracheal tube and NG tube. Left sub plate central line in stable position. 2. Cardiomegaly with pulmonary venous congestion and significant interim progression of bilateral pulmonary infiltrates. Persistent left pleural effusion. Findings are consistent with progressive congestive heart failure. Underlying pneumonia cannot be excluded.   Electronically Signed   By: Marcello Moores  Register   On: 06/12/2015 07:41   Dg Chest Port 1 View  06/11/2015   CLINICAL DATA:  Central line placement.  Initial encounter.  EXAM: PORTABLE CHEST - 1 VIEW  COMPARISON:  Chest radiograph performed 06/10/2015  FINDINGS: The patient's endotracheal tube is seen ending 3 cm above the carina. A left subclavian line is noted ending about the distal SVC.  A small left pleural effusion is noted. Right upper lobe airspace opacification is concerning for pneumonia, similar in appearance to the recent prior study. Underlying vascular congestion is noted. No pneumothorax is seen  The cardiomediastinal silhouette is enlarged. No acute osseous abnormalities are identified.  IMPRESSION: 1. Endotracheal tube seen ending 3 cm above the carina. 2. Left subclavian line noted ending about the distal SVC. 3. Right upper lobe airspace opacification, compatible with pneumonia, similar in appearance to the prior study. 4.  Small left pleural effusion noted. Cardiomegaly and vascular congestion.   Electronically Signed   By: Garald Balding M.D.   On: 06/11/2015 02:08   Dg Chest Port 1 View  06/10/2015   CLINICAL DATA:  Status post intubation  EXAM: PORTABLE CHEST - 1 VIEW  COMPARISON:  06/10/2015  FINDINGS: Cardiac shadow remains enlarged. A nasogastric catheter and endotracheal  tube are again seen. The endotracheal tube is been withdrawn and now lies in satisfactory position. Increasing consolidation within the right upper lobe is noted. Some patchy changes are noted in the left lung base as well. The left changes are stable from the recent exam.  IMPRESSION: Increasing right upper lobe consolidation.   Electronically Signed   By: Inez Catalina M.D.   On: 06/10/2015 19:05   Dg Chest Portable 1 View  06/10/2015   ADDENDUM REPORT: 06/10/2015 16:41  ADDENDUM: Study discussed by telephone with Dr. Hinda Kehr on 06/10/2015 at 1636 hours.  We also discussed that the enteric tube appears to be looped in the pharynx/neck on this image.  He advises that both the ET tube and enteric tube have been repositioned, and a repeat abdominal film is pending.   Electronically Signed   By: Genevie Ann M.D.   On: 06/10/2015 16:41   06/10/2015   CLINICAL DATA:  64 year old male intubated. Tachypnea. Initial encounter.  EXAM: PORTABLE CHEST - 1 VIEW  COMPARISON:  1442 hours today, and earlier  FINDINGS: Portable AP supine view at 1556 hours. Endotracheal tube tip at the carina. Decreased ventilation at the left lung base. Stable cardiomegaly and mediastinal contours. Mild crowding of markings elsewhere.  IMPRESSION: 1. Intubated, endotracheal tube tip at the carina. Retract up to 3 cm 4 Mar I optimal placement. 2. Left greater than right perihilar increased atelectasis.  Electronically Signed: By: Genevie Ann M.D. On: 06/10/2015 16:31   Dg Chest Port 1 View  06/10/2015   CLINICAL DATA:  Tachypnea, shallow breathing.  EXAM: PORTABLE CHEST - 1 VIEW   COMPARISON:  April 08, 2015.  FINDINGS: Stable cardiomegaly. No pneumothorax is noted. Increased central pulmonary vascular congestion is noted. Mild bibasilar opacities are noted concerning for subsegmental atelectasis, edema or effusions. Increased right upper lobe opacity is noted concerning for possible pneumonia or edema. Bony thorax is intact.  IMPRESSION: Cardiomegaly and increased central pulmonary vascular congestion is noted, with increased right upper lobe opacity concerning for pneumonia or possibly edema. Mild bibasilar opacities are noted concerning for subsegmental atelectasis or edema with possible associated minimal pleural effusions.   Electronically Signed   By: Marijo Conception, M.D.   On: 06/10/2015 15:08   Dg Abd Portable 1v  06/10/2015   CLINICAL DATA:  OG tube placement.  EXAM: PORTABLE ABDOMEN - 1 VIEW 4:31 p.m.  COMPARISON:  06/10/2015 3:52 p.m.  FINDINGS: There is an OG tube with tip in the fundus of the stomach. Endotracheal tube appears in good position 3 cm above the carina.  No visible dilated bowel. Pulmonary infiltrates noted in the right upper lobe and left lower lobe.  IMPRESSION: OG tube tip in the fundus of the stomach.   Electronically Signed   By: Lorriane Shire M.D.   On: 06/10/2015 16:51     Assessment and Plan  1. Chronic atrial fibrillation: initially with RVR, controlled on amiodarone drip since 8/15, changed to po amiodarone 400 mg bid on 8/20 in the setting of hypotension. Discussed case with PCCM and palliative care. Would prefer rate controlling agent at this time. Will discontinue PO amiodarone and start low dose metoprolol 12.5 mg bid with hold parameters. Low risk of amiodarone lung, though it certainly could be playing a role. Would be a diagnosis of exlcusion and would need a bronch to evaluate for foam cells. Treatment would be prednisone for months. There is discussion with his cousin with Palliative care regarding possible cessation of  care in the near  future. Because of this, his thrombocytopenia, and his history of liver disease he would not be a candidate for long term full dose anticoagulation.    2. Acute on chronic systolic heart failure, LVEF 35%, in setting multiorgan failure. Metoprolol added as above. If he is able to be successfully extubated could consider Entresto at a later date along with ischemic work up.   3. Acute respiratory failure, MSSA in the sputum, Enterococcus on resp cx. On ABX per PCCM and IM. Intubated. High risk of re-intubation if he is extubated. Wean as tolerated.   4. Acute renal failure, improved  5. Hospital acquired pneumonia, as above  6. Coagulopathy; PLT count continues to drop  7. Dispo, patient with overall poor prognosis and is of high risk of cardiopulmonary death   Melvern Banker, PA-C Pager: [phone] 06/20/2015, 10:49 AM

## 2015-06-20 NOTE — Progress Notes (Signed)
[facility] NOTE  Pharmacy Consult for Electrolytes/CRRT dose adjustment    Allergies  Allergen Reactions  . Penicillins Nausea And Vomiting    Patient Measurements: Height: 5\' 8"  (172.7 cm) Weight: 257 lb 11.5 oz (116.9 kg) IBW/kg (Calculated) : 68.4   Vital Signs: Temp: 98.2 F (36.8 C) (08/24 1000) Temp Source: Other (Comment) (08/24 0600) BP: 147/94 mmHg (08/24 1000) Pulse Rate: 81 (08/24 1000) Intake/Output from previous day: 08/23 0701 - 08/24 0700 In: 2861.7 [I.V.:988.5; NG/GT:628.3; IV Piggyback:1205] Out: 4391 [Urine:115] Intake/Output from this shift: Total I/O In: 436.6 [I.V.:36.6; IV Piggyback:400] Out: 770 [Other:770] Vent settings for last 24 hours: Vent Mode:  [-] Spontaneous FiO2 (%):  [35 %] 35 % Set Rate:  [24 bmp] 24 bmp Vt Set:  [500 mL] 500 mL PEEP:  [5 cmH20] 5 cmH20 Pressure Support:  [16 cmH20] 16 cmH20  Labs:  Recent Labs  06/18/15 0408 06/18/15 0814  06/19/15 0919 06/19/15 1700 06/19/15 2357 06/20/15 0439 06/20/15 0825 06/20/15 1030  WBC 5.1  --   --   --   --   --  8.4  --   --   HGB 10.9*  --   --   --   --   --  12.3*  --   --   HCT 33.4*  --   --   --   --   --  37.0*  --   --   PLT 34*  --   --   --   --   --  40*  --   --   CREATININE 1.20 1.23  < > 1.31* 1.12 1.24 1.11  --   --   MG  --  1.7  1.8  < > 1.8 1.8 1.7  --  1.0* 1.7  PHOS  --  2.2*  < > 2.8 2.2* 2.8  --   --   --   ALBUMIN  --  2.3*  < > 3.1* 3.1* 3.1* 3.5  --   --   PROT  --   --   --   --   --   --  6.5  --   --   AST  --  30  --   --   --   --  23  --   --   ALT  --  23  --   --   --   --  19  --   --   ALKPHOS  --   --   --   --   --   --  53  --   --   BILITOT  --   --   --   --   --   --  3.3*  --   --   < > = values in this interval not displayed. Estimated Creatinine Clearance: 85.7 mL/min (by C-G formula based on Cr of 1.11).   Recent Labs  06/20/15 0401 06/20/15 0733 06/20/15 1125  GLUCAP 114* 125* 136*   BMP Latest  Ref Rng 06/20/2015 06/19/2015 06/19/2015  Glucose 65 - 99 mg/dL 130(H) 147(H) 124(H)  BUN 6 - 20 mg/dL 17 16 16   Creatinine 0.61 - 1.24 mg/dL 1.11 1.24 1.12  Sodium 135 - 145 mmol/L 138 136 138  Potassium 3.5 - 5.1 mmol/L 4.7 4.7 4.8  Chloride 101 - 111 mmol/L 105 104 104  CO2 22 - 32 mmol/L 27 26 26   Calcium 8.9 - 10.3 mg/dL 8.9  8.6(L) 9.0     Microbiology: Recent Results (from the past 720 hour(s))  Blood Culture (routine x 2)     Status: None   Collection Time: 06/10/15  2:35 PM  Result Value Ref Range Status   Specimen Description BLOOD RIGHT WRIST  Final   Special Requests BOTTLES DRAWN AEROBIC AND ANAEROBIC  1CC  Final   Culture NO GROWTH 5 DAYS  Final   Report Status 06/15/2015 FINAL  Final  Blood Culture (routine x 2)     Status: None   Collection Time: 06/10/15  2:40 PM  Result Value Ref Range Status   Specimen Description BLOOD LEFT ASSIST CONTROL  Final   Special Requests   Final    BOTTLES DRAWN AEROBIC AND ANAEROBIC  AER 6CC ANA 4CC   Culture NO GROWTH 5 DAYS  Final   Report Status 06/15/2015 FINAL  Final  Urine culture     Status: None   Collection Time: 06/10/15  4:42 PM  Result Value Ref Range Status   Specimen Description URINE, RANDOM  Final   Special Requests NONE  Final   Culture 4,000 COLONIES/mL INSIGNIFICANT GROWTH  Final   Report Status 06/12/2015 FINAL  Final  MRSA PCR Screening     Status: None   Collection Time: 06/10/15  6:27 PM  Result Value Ref Range Status   MRSA by PCR NEGATIVE NEGATIVE Final    Comment:        The GeneXpert MRSA Assay (FDA approved for NASAL specimens only), is one component of a comprehensive MRSA colonization surveillance program. It is not intended to diagnose MRSA infection nor to guide or monitor treatment for MRSA infections.   Culture, sputum-assessment     Status: None   Collection Time: 06/11/15  2:50 PM  Result Value Ref Range Status   Specimen Description SPUTUM  Final   Special Requests NONE  Final    Sputum evaluation THIS SPECIMEN IS ACCEPTABLE FOR SPUTUM CULTURE  Final   Report Status 06/13/2015 FINAL  Final  Culture, respiratory (NON-Expectorated)     Status: None   Collection Time: 06/11/15  2:50 PM  Result Value Ref Range Status   Specimen Description SPUTUM  Final   Special Requests NONE Reflexed from [id number]  Final   Gram Stain   Final    MODERATE WBC SEEN RARE GRAM POSITIVE COCCI GOOD SPECIMEN - 80-90% WBCS    Culture MODERATE GROWTH STAPHYLOCOCCUS AUREUS  Final   Report Status 06/14/2015 FINAL  Final   Organism ID, Bacteria STAPHYLOCOCCUS AUREUS  Final      Susceptibility   Staphylococcus aureus - MIC*    CIPROFLOXACIN <=0.5 SENSITIVE Sensitive     GENTAMICIN <=0.5 SENSITIVE Sensitive     OXACILLIN 0.5 SENSITIVE Sensitive     TRIMETH/SULFA <=10 SENSITIVE Sensitive     CEFOXITIN SCREEN NEGATIVE Sensitive     Inducible Clindamycin NEGATIVE Sensitive     TETRACYCLINE Value in next row Sensitive      SENSITIVE<=1    * MODERATE GROWTH STAPHYLOCOCCUS AUREUS  Wound culture     Status: None   Collection Time: 06/13/15  1:56 PM  Result Value Ref Range Status   Specimen Description LEG  Final   Special Requests Normal  Final   Gram Stain   Final    FEW WBC SEEN MODERATE GRAM NEGATIVE RODS FEW GRAM POSITIVE COCCI    Culture   Final    LIGHT GROWTH STAPHYLOCOCCUS AUREUS RARE GROWTH PROTEUS PENNERI RARE GROWTH ENTEROCOCCUS FAECALIS  Report Status 06/17/2015 FINAL  Final   Organism ID, Bacteria STAPHYLOCOCCUS AUREUS  Final   Organism ID, Bacteria PROTEUS PENNERI  Final   Organism ID, Bacteria ENTEROCOCCUS FAECALIS  Final      Susceptibility   Staphylococcus aureus - MIC*    CIPROFLOXACIN <=0.5 SENSITIVE Sensitive     GENTAMICIN <=0.5 SENSITIVE Sensitive     OXACILLIN 0.5 SENSITIVE Sensitive     TRIMETH/SULFA <=10 SENSITIVE Sensitive     CEFOXITIN SCREEN NEGATIVE Sensitive     Inducible Clindamycin NEGATIVE Sensitive     TETRACYCLINE Value in next row Sensitive       SENSITIVE<=1    * LIGHT GROWTH STAPHYLOCOCCUS AUREUS   Proteus penneri - MIC*    AMPICILLIN Value in next row Resistant      SENSITIVE<=1    CEFAZOLIN Value in next row Resistant      SENSITIVE<=1    CEFTRIAXONE Value in next row Sensitive      SENSITIVE<=1    CIPROFLOXACIN Value in next row Sensitive      SENSITIVE<=1    GENTAMICIN Value in next row Sensitive      SENSITIVE<=1    IMIPENEM Value in next row Sensitive      SENSITIVE<=1    NITROFURANTOIN Value in next row Resistant      SENSITIVE<=1    TRIMETH/SULFA Value in next row Sensitive      SENSITIVE<=1    PIP/TAZO Value in next row Sensitive      SENSITIVE<=4    * RARE GROWTH PROTEUS PENNERI   Enterococcus faecalis - MIC*    AMPICILLIN Value in next row Sensitive      SENSITIVE<=4    LINEZOLID Value in next row Sensitive      SENSITIVE<=4    * RARE GROWTH ENTEROCOCCUS FAECALIS  Wound culture     Status: None   Collection Time: 06/13/15  1:58 PM  Result Value Ref Range Status   Specimen Description LEG  Final   Special Requests Normal  Final   Gram Stain RARE WBC SEEN FEW GRAM NEGATIVE RODS   Final   Culture NO GROWTH 4 DAYS  Final   Report Status 06/17/2015 FINAL  Final    Medications:  Scheduled:  . albumin human  12.5 g Intravenous Q6H  . ampicillin-sulbactam (UNASYN) IV  3 g Intravenous Q8H  . antiseptic oral rinse  7 mL Mouth Rinse QID  . chlorhexidine gluconate  15 mL Mouth Rinse BID  . ciprofloxacin  400 mg Intravenous Q12H  . clonazePAM  0.25 mg Oral BID  . famotidine  20 mg Oral Q48H  . feeding supplement (PRO-STAT SUGAR FREE 64)  30 mL Oral TID  . ipratropium-albuterol  3 mL Nebulization Q6H  . magnesium sulfate 1 - 4 g bolus IVPB  4 g Intravenous Once  . metoprolol tartrate  12.5 mg Oral BID    Assessment: 64 y/o M ventilated, sedated on CRRT.   Magnesium level was low but repeat lab one hour later is wnl.   Plan:  Magnesium 4 g iv once ordered and stopped due to repeat level wnl. No  medications require adjustment for CRRT at present. Will f/u PM labs and adjust as necessary.   Ulice Dash, PharmD 06/20/2015,12:09 PM

## 2015-06-20 NOTE — Progress Notes (Addendum)
[employer] at [place] NAME: Aaron Harrison    MR#:  [id number]  DATE OF BIRTH:  1951-03-10  SUBJECTIVE:  Remains intubated and on the vent. Off IV precedex, follows simple commands No fever  CRRT being tolerated TF held Flexiseal+ diarreha slowing down Did not tolerate vent wean y'day. Back on PSV today REVIEW OF SYSTEMS:   Review of Systems  Unable to perform ROS: intubated   Tolerating Diet:TF resumed Tolerating PT: no,on vent   DRUG ALLERGIES:   Allergies  Allergen Reactions  . Penicillins Nausea And Vomiting    VITALS:  Blood pressure 138/81, pulse 105, temperature 98.1 F (36.7 C), temperature source Other (Comment), resp. rate 29, height 5\' 8"  (1.727 m), weight 116.9 kg (257 lb 11.5 oz), SpO2 88 %.  PHYSICAL EXAMINATION:   Physical Exam  GENERAL:  64 y.o.-year-old patient lying in the bed with no acute distress. critically ill anasarca EYES: Pupils equal, round, reactive to light and accommodation. No scleral icterus. Extraocular muscles intact.  HEENT: Head atraumatic, normocephalic. Oropharynx and nasopharynx clear.  -orally intubated NECK:  Supple, no jugular venous distention. No thyroid enlargement, no tenderness.  LUNGS: Normal breath sounds bilaterally, no wheezing, rales, rhonchi. No use of accessory muscles of respiration. Left upper chest CL+ CARDIOVASCULAR: S1, S2 normal. No murmurs, rubs, or gallops.  ABDOMEN: Soft, nontender, nondistended. Bowel sounds present. No organomegaly or mass.  -severe scrotal edema EXTREMITIES: bilateral ++++ edema b/l with weeping skin ulcers on tibial shin NEUROLOGIC: sedated and on the vent PSYCHIATRIC: on the vent SKIN:bilateral LE cellulitis and weeping skin ulcers over the tibial shin  LABORATORY PANEL:   CBC  Recent Labs Lab 06/20/15 0439  WBC 8.4  HGB 12.3*  HCT 37.0*  PLT 40*    Chemistries   Recent Labs Lab 06/20/15 0439  06/20/15 1030  NA 138   --  140  K 4.7  --  4.5  CL 105  --  106  CO2 27  --  25  GLUCOSE 130*  --  137*  BUN 17  --  18  CREATININE 1.11  --  1.18  CALCIUM 8.9  --  9.1  MG  --   < > 1.7  AST 23  --   --   ALT 19  --   --   ALKPHOS 53  --   --   BILITOT 3.3*  --   --   < > = values in this interval not displayed.  Cardiac Enzymes No results for input(s): TROPONINI in the last 168 hours.  RADIOLOGY:  Dg Chest Port 1 View  06/20/2015   CLINICAL DATA:  Respiratory failure.  EXAM: PORTABLE CHEST - 1 VIEW  COMPARISON:  06/18/2015.  FINDINGS: Endotracheal tube, NG tube, left subclavian line in stable position. Cardiomegaly with progressive bilateral pulmonary alveolar infiltrates and pleural effusions noted. Findings consistent with progressive congestive heart failure. Bilateral pneumonia cannot be excluded. No pneumothorax.  IMPRESSION: 1. Lines and tubes in stable position. 2. Cardiomegaly with progressive bilateral pulmonary alveolar infiltrates and pleural effusions consistent congestive heart failure. Bilateral pneumonia cannot be excluded.   Electronically Signed   By: Marcello Moores  Register   On: 06/20/2015 07:19     ASSESSMENT AND PLAN:  64 y.o. male with a known history of A. fib, CHF came in with  * Acute respiratory failure with hypoxia: intubated in ED, extubated on August 15 - Reintubated on August 17 due to increased work of  breathing. - OFF  IV precedex gtt for now -vent wean being attempted, failed y'day. Try again today   * Sepsis with multiorgan failure: Likely hospital-acquired pneumonia -BC negative -wbc stable -no fever  * MSSA Pneumonia- on  IV Unasyn  * Acute renal failure: Possible cardiorenal syndrome due to underlying CHF. Appreciate nephrology input - temporary dialysis catheter placed in the right groin and CRRT started 8/17  * Acute on chronic systolic CHF  Echo showing ejection fraction 30% to 35% -CRRT with  UF -pt has  severe anasarca  * A. fib with RVR:  Not able to  use rate controlling medication due to hypotension. - On po  amiodarone-Continue aspirin for now  * Elevated troponin: Due to supply demand ischemia  * Bilateral lower extremity cellulitis: change to IV cipro given proteus in the wound Seen by wound care RN.   * Hyperkalemia: Resolved  * Ascites/Abnormal liver function test: GI following, on lactulose  *palliative care consult noted. Pt has a very poor prognosis given multiorgan failure.no family. Note from palliatve care indicates trial of vent wean and see if pt shows any signs of improvement  Case discussed with Care Management/Social Worker.  CODE STATUS: DNR DVT Prophylaxis: sq heparin  TOTAL Critical TIME TAKING CARE OF THIS PATIENT: 13minutes.  >50% time spent on counselling and coordination of care with RN, Dr Carlene Coria M.D on 06/20/2015 at 1:59 PM  Between 7am to 6pm - Pager - [phone]  After 6pm go to [email or web address] - password [email or web address] [facility]  [facility]  Office  [phone]  CC: Primary care physician; No PCP Per Patient

## 2015-06-20 NOTE — Progress Notes (Signed)
Nutrition Follow-up    INTERVENTION:   [id number] increasing to rate of 45 ml/hr; if tolerating, recommend titrating as per order set to goal rate of 60 ml/hr   NUTRITION DIAGNOSIS:   Inadequate oral intake related to acute illness as evidenced by NPO status. Being addressed via TF  GOAL:   Provide needs based on ASPEN/SCCM guidelines  MONITOR:    (Energy Intake, EN, Digestive System, Electrolyte/Renal Profile, Glucose Profile)  ASSESSMENT:    Pt remains on vent, on CRRT, UF 250 ml/hr, on precedex   Diet Order:  Diet NPO time specified  EN: tolerating Vital High Protein at rate of 30 ml/hr  Digestive System: flexiseal with 100 mL liquid stool, residuals 260 mL or less, no signs of TF intolearnce  Skin:  Reviewed, no issues   Electrolyte/Renal Profile  Recent Labs Lab 06/19/15 1700 06/19/15 2357 06/20/15 0439 06/20/15 0825 06/20/15 1030  NA 138 136 138  --  140  K 4.8 4.7 4.7  --  4.5  CL 104 104 105  --  106  CO2 26 26 27   --  25  BUN 16 16 17   --  18  CREATININE 1.12 1.24 1.11  --  1.18  CALCIUM 9.0 8.6* 8.9  --  9.1  MG 1.8 1.7  --  1.0* 1.7  PHOS 2.2* 2.8  --   --  2.2*  GLUCOSE 124* 147* 130*  --  137*   Glucose Profile:  Recent Labs  06/20/15 0401 06/20/15 0733 06/20/15 1125  GLUCAP 114* 125* 136*   Nutritional Anemia Profile:  CBC Latest Ref Rng 06/20/2015 06/18/2015 06/17/2015  WBC 3.8 - 10.6 K/uL 8.4 5.1 5.3  Hemoglobin 13.0 - 18.0 g/dL 12.3(L) 10.9(L) 10.8(L)  Hematocrit 40.0 - 52.0 % 37.0(L) 33.4(L) 33.9(L)  Platelets 150 - 440 K/uL 40(L) 34(L) 33(L)    Protein Profile:  Recent Labs Lab 06/19/15 2357 06/20/15 0439 06/20/15 1030  ALBUMIN 3.1* 3.5 3.3*   Meds: reviewed  Height:   Ht Readings from Last 1 Encounters:  06/13/15 5\' 8"  (1.727 m)    Weight:   Wt Readings from Last 1 Encounters:  06/20/15 257 lb 11.5 oz (116.9 kg)    BMI:  Body mass index is 39.19 kg/(m^2).  Estimated Nutritional Needs:   Kcal:  5053-9767  kcals (11-14 kcals/kg)   Protein:  140-175 g (2.0-2.5 g/kg)   Fluid:  1750-2100 mL (25-30 ml/kg)   HIGH Care Level  Kerman Passey MS, RD, LDN [phone] Pager
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
## 2015-06-20 NOTE — Progress Notes (Signed)
Upon shift change, report was being given by Eduard Clos, rn to myself.  When we went in to see the pt, pt had both mitted hands wrapped around ET tube and was pulling. Pt pulled tube out to approximately 21 at the lip.  Respiratory was called.  Pt remains on 47mcg/kg/hr of precedex.

## 2015-06-20 NOTE — Progress Notes (Signed)
Subjective:  Pt remains on CRRT. Remains oliguric. UF 150 cc/hr Abdomen distended Continues to have massive edema UF 4276 cc last 24 hrs Net neg by -1529 cc   Objective:  Vital signs in last 24 hours:  Temp:  [96.1 F (35.6 C)-98.6 F (37 C)] 98.2 F (36.8 C) (08/24 1000) Pulse Rate:  [71-89] 81 (08/24 1000) Resp:  [22-27] 25 (08/24 1000) BP: (135-158)/(86-101) 147/94 mmHg (08/24 1000) SpO2:  [93 %-100 %] 95 % (08/24 1000) FiO2 (%):  [35 %] 35 % (08/24 0835) Weight:  [116.9 kg (257 lb 11.5 oz)] 116.9 kg (257 lb 11.5 oz) (08/24 0326)  Weight change: -2.2 kg (-4 lb 13.6 oz) Filed Weights   06/18/15 0500 06/19/15 0400 06/20/15 0326  Weight: 124.2 kg (273 lb 13 oz) 119.1 kg (262 lb 9.1 oz) 116.9 kg (257 lb 11.5 oz)    Intake/Output: I/O last 3 completed shifts: In: 3877.4 [I.V.:1211.7; Other:40; NG/GT:920.8; IV Piggyback:1705] Out: 2841 [Urine:135; LKGMW:1027; Stool:100]   Intake/Output this shift:  Total I/O In: 436.6 [I.V.:36.6; IV Piggyback:400] Out: 770 [Other:770]  Physical Exam: General: Critically ill appearing  Head: Normocephalic, atraumatic. ETT in place  Eyes: anicteric  Neck: Supple, trachea midline  Lungs:  Bilateral rhonchi; vent assisted  Heart: Irregular S1S2 no rubs  Abdomen:  nontender, mild distension, tense  Extremities:  ++ generalized  edema  Neurologic: Intubated, arousable however  Skin: ulcertaion bilateral lower extremities on shins, ? jaundice   Access: Femoral dialysis catheter    Basic Metabolic Panel:  Recent Labs Lab 06/18/15 1611 06/19/15 0036 06/19/15 0919 06/19/15 1700 06/19/15 2357 06/20/15 0439 06/20/15 0825 06/20/15 1030  NA 135 136 138 138 136 138  --   --   K 4.5 4.7 4.9 4.8 4.7 4.7  --   --   CL 102 103 105 104 104 105  --   --   CO2 26 26 28 26 26 27   --   --   GLUCOSE 139* 115* 127* 124* 147* 130*  --   --   BUN 14 14 17 16 16 17   --   --   CREATININE 1.22 1.21 1.31* 1.12 1.24 1.11  --   --   CALCIUM 8.3*  8.6* 8.6* 9.0 8.6* 8.9  --   --   MG  --  1.8 1.8 1.8 1.7  --  1.0* 1.7  PHOS 2.2* 2.1* 2.8 2.2* 2.8  --   --   --     Liver Function Tests:  Recent Labs Lab 06/18/15 0814  06/19/15 0036 06/19/15 0919 06/19/15 1700 06/19/15 2357 06/20/15 0439  AST 30  --   --   --   --   --  23  ALT 23  --   --   --   --   --  19  ALKPHOS  --   --   --   --   --   --  53  BILITOT  --   --   --   --   --   --  3.3*  PROT  --   --   --   --   --   --  6.5  ALBUMIN 2.3*  < > 2.9* 3.1* 3.1* 3.1* 3.5  < > = values in this interval not displayed. No results for input(s): LIPASE, AMYLASE in the last 168 hours.  Recent Labs Lab 06/18/15 1115  AMMONIA 24    CBC:  Recent Labs Lab 06/15/15 1609 06/16/15 0006  06/17/15 0423 06/18/15 0408 06/20/15 0439  WBC 4.2 5.4 5.3 5.1 8.4  HGB 10.6* 11.0* 10.8* 10.9* 12.3*  HCT 34.5* 34.3* 33.9* 33.4* 37.0*  MCV 106.7* 104.1* 103.0* 103.3* 102.3*  PLT 46* 36* 33* 34* 40*    Cardiac Enzymes: No results for input(s): CKTOTAL, CKMB, CKMBINDEX, TROPONINI in the last 168 hours.  BNP: Invalid input(s): POCBNP  CBG:  Recent Labs Lab 06/19/15 1958 06/20/15 0002 06/20/15 0401 06/20/15 0733 06/20/15 1125  GLUCAP 128* 137* 114* 125* 136*    Microbiology: Results for orders placed or performed during the hospital encounter of 06/10/15  Blood Culture (routine x 2)     Status: None   Collection Time: 06/10/15  2:35 PM  Result Value Ref Range Status   Specimen Description BLOOD RIGHT WRIST  Final   Special Requests BOTTLES DRAWN AEROBIC AND ANAEROBIC  [place]  Final   Culture NO GROWTH 5 DAYS  Final   Report Status 06/15/2015 FINAL  Final  Blood Culture (routine x 2)     Status: None   Collection Time: 06/10/15  2:40 PM  Result Value Ref Range Status   Specimen Description BLOOD LEFT ASSIST CONTROL  Final   Special Requests   Final    BOTTLES DRAWN AEROBIC AND ANAEROBIC  AER 6CC ANA 4CC   Culture NO GROWTH 5 DAYS  Final   Report Status 06/15/2015  FINAL  Final  Urine culture     Status: None   Collection Time: 06/10/15  4:42 PM  Result Value Ref Range Status   Specimen Description URINE, RANDOM  Final   Special Requests NONE  Final   Culture 4,000 COLONIES/mL INSIGNIFICANT GROWTH  Final   Report Status 06/12/2015 FINAL  Final  MRSA PCR Screening     Status: None   Collection Time: 06/10/15  6:27 PM  Result Value Ref Range Status   MRSA by PCR NEGATIVE NEGATIVE Final    Comment:        The GeneXpert MRSA Assay (FDA approved for NASAL specimens only), is one component of a comprehensive MRSA colonization surveillance program. It is not intended to diagnose MRSA infection nor to guide or monitor treatment for MRSA infections.   Culture, sputum-assessment     Status: None   Collection Time: 06/11/15  2:50 PM  Result Value Ref Range Status   Specimen Description SPUTUM  Final   Special Requests NONE  Final   Sputum evaluation THIS SPECIMEN IS ACCEPTABLE FOR SPUTUM CULTURE  Final   Report Status 06/13/2015 FINAL  Final  Culture, respiratory (NON-Expectorated)     Status: None   Collection Time: 06/11/15  2:50 PM  Result Value Ref Range Status   Specimen Description SPUTUM  Final   Special Requests NONE Reflexed from [id number]  Final   Gram Stain   Final    MODERATE WBC SEEN RARE GRAM POSITIVE COCCI GOOD SPECIMEN - 80-90% WBCS    Culture MODERATE GROWTH STAPHYLOCOCCUS AUREUS  Final   Report Status 06/14/2015 FINAL  Final   Organism ID, Bacteria STAPHYLOCOCCUS AUREUS  Final      Susceptibility   Staphylococcus aureus - MIC*    CIPROFLOXACIN <=0.5 SENSITIVE Sensitive     GENTAMICIN <=0.5 SENSITIVE Sensitive     OXACILLIN 0.5 SENSITIVE Sensitive     TRIMETH/SULFA <=10 SENSITIVE Sensitive     CEFOXITIN SCREEN NEGATIVE Sensitive     Inducible Clindamycin NEGATIVE Sensitive     TETRACYCLINE Value in next row Sensitive  SENSITIVE<=1    * MODERATE GROWTH STAPHYLOCOCCUS AUREUS  Wound culture     Status: None    Collection Time: 06/13/15  1:56 PM  Result Value Ref Range Status   Specimen Description LEG  Final   Special Requests Normal  Final   Gram Stain   Final    FEW WBC SEEN MODERATE GRAM NEGATIVE RODS FEW GRAM POSITIVE COCCI    Culture   Final    LIGHT GROWTH STAPHYLOCOCCUS AUREUS RARE GROWTH PROTEUS PENNERI RARE GROWTH ENTEROCOCCUS FAECALIS    Report Status 06/17/2015 FINAL  Final   Organism ID, Bacteria STAPHYLOCOCCUS AUREUS  Final   Organism ID, Bacteria PROTEUS PENNERI  Final   Organism ID, Bacteria ENTEROCOCCUS FAECALIS  Final      Susceptibility   Staphylococcus aureus - MIC*    CIPROFLOXACIN <=0.5 SENSITIVE Sensitive     GENTAMICIN <=0.5 SENSITIVE Sensitive     OXACILLIN 0.5 SENSITIVE Sensitive     TRIMETH/SULFA <=10 SENSITIVE Sensitive     CEFOXITIN SCREEN NEGATIVE Sensitive     Inducible Clindamycin NEGATIVE Sensitive     TETRACYCLINE Value in next row Sensitive      SENSITIVE<=1    * LIGHT GROWTH STAPHYLOCOCCUS AUREUS   Proteus penneri - MIC*    AMPICILLIN Value in next row Resistant      SENSITIVE<=1    CEFAZOLIN Value in next row Resistant      SENSITIVE<=1    CEFTRIAXONE Value in next row Sensitive      SENSITIVE<=1    CIPROFLOXACIN Value in next row Sensitive      SENSITIVE<=1    GENTAMICIN Value in next row Sensitive      SENSITIVE<=1    IMIPENEM Value in next row Sensitive      SENSITIVE<=1    NITROFURANTOIN Value in next row Resistant      SENSITIVE<=1    TRIMETH/SULFA Value in next row Sensitive      SENSITIVE<=1    PIP/TAZO Value in next row Sensitive      SENSITIVE<=4    * RARE GROWTH PROTEUS PENNERI   Enterococcus faecalis - MIC*    AMPICILLIN Value in next row Sensitive      SENSITIVE<=4    LINEZOLID Value in next row Sensitive      SENSITIVE<=4    * RARE GROWTH ENTEROCOCCUS FAECALIS  Wound culture     Status: None   Collection Time: 06/13/15  1:58 PM  Result Value Ref Range Status   Specimen Description LEG  Final   Special Requests  Normal  Final   Gram Stain RARE WBC SEEN FEW GRAM NEGATIVE RODS   Final   Culture NO GROWTH 4 DAYS  Final   Report Status 06/17/2015 FINAL  Final    Coagulation Studies: No results for input(s): LABPROT, INR in the last 72 hours.  Urinalysis: No results for input(s): COLORURINE, LABSPEC, PHURINE, GLUCOSEU, HGBUR, BILIRUBINUR, KETONESUR, PROTEINUR, UROBILINOGEN, NITRITE, LEUKOCYTESUR in the last 72 hours.  Invalid input(s): APPERANCEUR    Imaging: Dg Chest Port 1 View  06/20/2015   CLINICAL DATA:  Respiratory failure.  EXAM: PORTABLE CHEST - 1 VIEW  COMPARISON:  06/18/2015.  FINDINGS: Endotracheal tube, NG tube, left subclavian line in stable position. Cardiomegaly with progressive bilateral pulmonary alveolar infiltrates and pleural effusions noted. Findings consistent with progressive congestive heart failure. Bilateral pneumonia cannot be excluded. No pneumothorax.  IMPRESSION: 1. Lines and tubes in stable position. 2. Cardiomegaly with progressive bilateral pulmonary alveolar infiltrates and pleural effusions consistent  congestive heart failure. Bilateral pneumonia cannot be excluded.   Electronically Signed   By: Marcello Moores  Register   On: 06/20/2015 07:19     Medications:   . dexmedetomidine 0.393 mcg/kg/hr (06/20/15 0900)  . feeding supplement (VITAL HIGH PROTEIN) 1,000 mL (06/20/15 1211)  . pureflow 2,500 mL/hr at 06/20/15 0853   . albumin human  12.5 g Intravenous Q6H  . ampicillin-sulbactam (UNASYN) IV  3 g Intravenous Q8H  . antiseptic oral rinse  7 mL Mouth Rinse QID  . chlorhexidine gluconate  15 mL Mouth Rinse BID  . ciprofloxacin  400 mg Intravenous Q12H  . clonazePAM  0.25 mg Oral BID  . famotidine  20 mg Oral Q48H  . feeding supplement (PRO-STAT SUGAR FREE 64)  30 mL Oral TID  . ipratropium-albuterol  3 mL Nebulization Q6H  . magnesium sulfate 1 - 4 g bolus IVPB  4 g Intravenous Once  . metoprolol tartrate  12.5 mg Oral BID   ammonium lactate, anticoagulant sodium  citrate, fentaNYL (SUBLIMAZE) injection, ipratropium-albuterol, rocuronium, senna-docusate  Assessment/ Plan:  64 y.o. male with a PMHx of congestive heart failure ejection fraction 30-35%, prior pneumonia, lower extremity edema with cellulitis, atrial fibrillation who was admitted to [facility] on 06/10/2015 for evaluation of altered mental status and shortness of breath. Upon admission the patient underwent intubation with transition to a mechanical ventilation.  1. Acute renal failure/chronic kidney disease stage III with baseline creatinine 1.3/proteinuria. The patient most likely has acute renal failure secondary to altered cardiorenal hemodynamics. His ejection fraction is low at 30%. - remains off pressors -Oliguria persists, continue CRRT at this time, UF target  Increase to 250cc/hr.    2. Acute respiratory failure. Remains on the vent, , continue volume removal to treat underlying heart failure/pulmonary edema.   3.anasarca from  Acute systolic heart failure. EF 30-35%. - d/c 1/2 ND-D5 as it is contributing to volume - use D10 for low blood sugars - UF 250 cc/hr (with iv albumin for oncotic support)    LOS: 10 Raymir Frommelt 8/24/201612:14 PM

## 2015-06-21 DIAGNOSIS — N186 End stage renal disease: Secondary | ICD-10-CM

## 2015-06-21 DIAGNOSIS — I481 Persistent atrial fibrillation: Secondary | ICD-10-CM

## 2015-06-21 DIAGNOSIS — D631 Anemia in chronic kidney disease: Secondary | ICD-10-CM

## 2015-06-21 DIAGNOSIS — I5021 Acute systolic (congestive) heart failure: Secondary | ICD-10-CM

## 2015-06-21 DIAGNOSIS — Z992 Dependence on renal dialysis: Secondary | ICD-10-CM

## 2015-06-21 DIAGNOSIS — D696 Thrombocytopenia, unspecified: Secondary | ICD-10-CM

## 2015-06-21 DIAGNOSIS — I472 Ventricular tachycardia: Secondary | ICD-10-CM | POA: Insufficient documentation

## 2015-06-21 DIAGNOSIS — I4729 Other ventricular tachycardia: Secondary | ICD-10-CM | POA: Insufficient documentation

## 2015-06-21 DIAGNOSIS — IMO0001 Reserved for inherently not codable concepts without codable children: Secondary | ICD-10-CM | POA: Insufficient documentation

## 2015-06-21 DIAGNOSIS — J9601 Acute respiratory failure with hypoxia: Principal | ICD-10-CM

## 2015-06-21 LAB — RENAL FUNCTION PANEL
ANION GAP: 6 (ref 5–15)
Albumin: 3.4 g/dL — ABNORMAL LOW (ref 3.5–5.0)
Albumin: 3.5 g/dL (ref 3.5–5.0)
Albumin: 3.7 g/dL (ref 3.5–5.0)
Anion gap: 6 (ref 5–15)
Anion gap: 6 (ref 5–15)
BUN: 27 mg/dL — ABNORMAL HIGH (ref 6–20)
BUN: 25 mg/dL — AB (ref 6–20)
BUN: 30 mg/dL — ABNORMAL HIGH (ref 6–20)
CALCIUM: 8.9 mg/dL (ref 8.9–10.3)
CHLORIDE: 105 mmol/L (ref 101–111)
CHLORIDE: 105 mmol/L (ref 101–111)
CO2: 28 mmol/L (ref 22–32)
CO2: 28 mmol/L (ref 22–32)
CO2: 28 mmol/L (ref 22–32)
CREATININE: 1.26 mg/dL — AB (ref 0.61–1.24)
CREATININE: 1.3 mg/dL — AB (ref 0.61–1.24)
Calcium: 9 mg/dL (ref 8.9–10.3)
Calcium: 9 mg/dL (ref 8.9–10.3)
Chloride: 105 mmol/L (ref 101–111)
Creatinine, Ser: 1.23 mg/dL (ref 0.61–1.24)
GFR, EST NON AFRICAN AMERICAN: 59 mL/min — AB (ref >60)
GFR, EST NON AFRICAN AMERICAN: 57 mL/min — AB (ref >60)
Glucose, Bld: 144 mg/dL — ABNORMAL HIGH (ref 65–99)
Glucose, Bld: 147 mg/dL — ABNORMAL HIGH (ref 65–99)
Glucose, Bld: 101 mg/dL — ABNORMAL HIGH (ref 65–99)
POTASSIUM: 4.7 mmol/L (ref 3.5–5.1)
POTASSIUM: 4.4 mmol/L (ref 3.5–5.1)
Phosphorus: 2.1 mg/dL — ABNORMAL LOW (ref 2.5–4.6)
Phosphorus: 2 mg/dL — ABNORMAL LOW (ref 2.5–4.6)
Phosphorus: 3.2 mg/dL (ref 2.5–4.6)
Potassium: 4.6 mmol/L (ref 3.5–5.1)
SODIUM: 139 mmol/L (ref 135–145)
Sodium: 139 mmol/L (ref 135–145)
Sodium: 139 mmol/L (ref 135–145)

## 2015-06-21 LAB — CBC WITH DIFFERENTIAL/PLATELET
BASOS ABS: 0 10*3/uL (ref 0–0.1)
Basophils Relative: 1 %
Eosinophils Absolute: 0.1 10*3/uL (ref 0–0.7)
Eosinophils Relative: 1 %
HEMATOCRIT: 34.6 % — AB (ref 40.0–52.0)
Hemoglobin: 11.3 g/dL — ABNORMAL LOW (ref 13.0–18.0)
Lymphs Abs: 0.6 10*3/uL — ABNORMAL LOW (ref 1.0–3.6)
MCH: 33.4 pg (ref 26.0–34.0)
MCHC: 32.7 g/dL (ref 32.0–36.0)
MCV: 102.3 fL — AB (ref 80.0–100.0)
Monocytes Absolute: 0.5 10*3/uL (ref 0.2–1.0)
Monocytes Relative: 6 %
NEUTROS ABS: 7.1 10*3/uL — AB (ref 1.4–6.5)
Neutrophils Relative %: 85 %
Platelets: 34 10*3/uL — ABNORMAL LOW (ref 150–440)
RBC: 3.39 MIL/uL — AB (ref 4.40–5.90)
RDW: 17.1 % — ABNORMAL HIGH (ref 11.5–14.5)
WBC: 8.3 10*3/uL (ref 3.8–10.6)

## 2015-06-21 LAB — BLOOD GAS, ARTERIAL
ACID-BASE EXCESS: 2.2 mmol/L (ref 0.0–3.0)
ALLENS TEST (PASS/FAIL): POSITIVE — AB
BICARBONATE: 27.3 meq/L (ref 21.0–28.0)
FIO2: 35
O2 SAT: 96.5 %
PEEP: 5 cmH2O
PRESSURE SUPPORT: 5 cmH2O
Patient temperature: 37
pCO2 arterial: 43 mmHg (ref 32.0–48.0)
pH, Arterial: 7.41 (ref 7.350–7.450)
pO2, Arterial: 85 mmHg (ref 83.0–108.0)

## 2015-06-21 LAB — GLUCOSE, CAPILLARY
GLUCOSE-CAPILLARY: 128 mg/dL — AB (ref 65–99)
GLUCOSE-CAPILLARY: 150 mg/dL — AB (ref 65–99)
GLUCOSE-CAPILLARY: 78 mg/dL (ref 65–99)
GLUCOSE-CAPILLARY: 82 mg/dL (ref 65–99)
Glucose-Capillary: 123 mg/dL — ABNORMAL HIGH (ref 65–99)
Glucose-Capillary: 126 mg/dL — ABNORMAL HIGH (ref 65–99)

## 2015-06-21 LAB — MAGNESIUM
MAGNESIUM: 1.9 mg/dL (ref 1.7–2.4)
MAGNESIUM: 1.8 mg/dL (ref 1.7–2.4)
Magnesium: 1.8 mg/dL (ref 1.7–2.4)

## 2015-06-21 MED ORDER — HEPARIN SODIUM (PORCINE) 5000 UNIT/ML IJ SOLN
5000.0000 [IU] | Freq: Two times a day (BID) | INTRAMUSCULAR | Status: DC
  Administered 2015-06-21: 5000 [IU] via SUBCUTANEOUS
  Filled 2015-06-21 (×2): qty 1

## 2015-06-21 MED ORDER — STERILE WATER FOR INJECTION IJ SOLN
INTRAMUSCULAR | Status: AC
  Filled 2015-06-21: qty 10

## 2015-06-21 MED ORDER — SODIUM CHLORIDE 0.9 % IV SOLN: Freq: Once | INTRAVENOUS | Status: DC

## 2015-06-21 MED ORDER — LACTULOSE 10 GM/15ML PO SOLN
20.0000 g | Freq: Two times a day (BID) | ORAL | Status: DC
  Administered 2015-06-21 – 2015-07-06 (×28): 20 g via ORAL
  Filled 2015-06-21 (×29): qty 30

## 2015-06-21 MED ORDER — HEPARIN SODIUM (PORCINE) 1000 UNIT/ML IJ SOLN
1000.0000 [IU] | Freq: Once | INTRAMUSCULAR | Status: AC
  Administered 2015-06-21: 1000 [IU] via INTRAVENOUS
  Filled 2015-06-21: qty 1

## 2015-06-21 MED ORDER — VITAL HIGH PROTEIN PO LIQD: 1000.0000 mL | ORAL | Status: DC

## 2015-06-21 MED ORDER — SODIUM PHOSPHATE 3 MMOLE/ML IV SOLN
30.0000 mmol | Freq: Once | INTRAVENOUS | Status: AC
  Administered 2015-06-21: 30 mmol via INTRAVENOUS
  Filled 2015-06-21: qty 10

## 2015-06-21 MED ORDER — ALTEPLASE 2 MG IJ SOLR
2.0000 mg | Freq: Once | INTRAMUSCULAR | Status: AC
Start: 2015-06-21 — End: 2015-06-21
  Administered 2015-06-21: 2 mg
  Filled 2015-06-21: qty 2

## 2015-06-21 NOTE — Progress Notes (Signed)
Pt on CRRT. Cartridge life expired requiring cartridge changed. CRRT held for cartridge change. Cartridge  was changed and CRRT restated with no complications. Pt tolerated well, vss. Will continue to monitor.

## 2015-06-21 NOTE — Progress Notes (Signed)
Extubated without complications to 4lnc 

## 2015-06-21 NOTE — Progress Notes (Signed)
Took report from [facility] and assumed care of pt. Pt resting in bed. Pt vss. Pt on vent and sedated with Precedex 0.08. Pt receiving CRRT. Pt tolerating vent and crrt well. Pt alert and able to follow simple commands. Pt denies pain. Will continue to monitor.

## 2015-06-21 NOTE — Progress Notes (Signed)
Approx 1845 pt crrt stopped due to venous port occluding. S/w dr Candiss Norse, give verbal order for heparin and cathflow. (see Mar). Venous port packed with heparin, dwelled for appox 45 mins, port still occluded. cathflow administered at this time. Report given and care assumed by [facility].

## 2015-06-21 NOTE — Progress Notes (Signed)
Changed dressing to pt lower extremities per order. Pt tolerated well with no complications. Pt vss. Pt denies pain. Will continue to monitor.

## 2015-06-21 NOTE — Progress Notes (Signed)
S/w dr ram, abg resulted back wdl. Pt wob wdl. Per dr. Juanell Fairly extubate pt. Informed respiratory, pt extubated without complications at 0929. Pt on 4l [place]. 02 sat 97-100%. Pt denies pain. Vss. Will continue to monitor.

## 2015-06-21 NOTE — Progress Notes (Addendum)
Nutrition Follow-up    INTERVENTION:   EN: recommend continuing TF as tolerated with goal of 60 ml/hr, Prostat TID, minimal free water flushes  NUTRITION DIAGNOSIS:   Inadequate oral intake related to acute illness as evidenced by NPO status.   GOAL:   Provide needs based on ASPEN/SCCM guidelines  MONITOR:    (Energy Intake, EN, Digestive System, Electrolyte/Renal Profile, Glucose Profile)  REASON FOR ASSESSMENT:   Consult Enteral/tube feeding initiation and management  ASSESSMENT:    Pt remains on vent, on precedex, on CRRT with UF 250 ml/hr, Net UF 5.5 Liters yesterday, phosphorus being replaced  Diet Order:  Diet NPO time specified   EN: tolerating Vital High Protein at rate of 45 ml/hr  Digestive System: no signs of TF intolearance, abdomen remains distended, residuals 310 mL or less, flexiseal with minimal output, MD restarting lactulose today per discussion during ICU rounds  Skin:  Reviewed, no issues  Last BM:  8/18   Meds: lactulose restarted, IV albumin  Electrolyte/Renal Profile  Recent Labs Lab 06/20/15 1030 06/21/15 0026 06/21/15 0810  NA 140 139 139  K 4.5 4.7 4.4  CL 106 105 105  CO2 25 28 28   BUN 18 25* 27*  CREATININE 1.18 1.23 1.26*  CALCIUM 9.1 9.0 9.0  MG 1.7 1.9 1.8  PHOS 2.2* 2.1* 2.0*  GLUCOSE 137* 144* 147*    Nutritional Anemia Profile:  CBC Latest Ref Rng 06/21/2015 06/20/2015 06/18/2015  WBC 3.8 - 10.6 K/uL 8.3 8.4 5.1  Hemoglobin 13.0 - 18.0 g/dL 11.3(L) 12.3(L) 10.9(L)  Hematocrit 40.0 - 52.0 % 34.6(L) 37.0(L) 33.4(L)  Platelets 150 - 440 K/uL 34(L) 40(L) 34(L)   Glucose Profile:  Recent Labs  06/21/15 0010 06/21/15 0413 06/21/15 0748  GLUCAP 128* 123* 126*     Height:   Ht Readings from Last 1 Encounters:  06/13/15 5\' 8"  (1.727 m)    Weight:   Wt Readings from Last 1 Encounters:  06/21/15 247 lb 12.8 oz (112.4 kg)    Ideal Body Weight:     Filed Weights   06/19/15 0400 06/20/15 0326 06/21/15 0429   Weight: 262 lb 9.1 oz (119.1 kg) 257 lb 11.5 oz (116.9 kg) 247 lb 12.8 oz (112.4 kg)    BMI:  Body mass index is 37.69 kg/(m^2).  Estimated Nutritional Needs:   Kcal:  0998-3382 kcals (11-14 kcals/kg)   Protein:  140-175 g (2.0-2.5 g/kg)   Fluid:  1750-2100 mL (25-30 ml/kg)   HIGH Care Level Kerman Passey MS, RD, LDN [phone] Pager

## 2015-06-21 NOTE — Progress Notes (Signed)
S/w dr. Megan Salon. Informed her that pt was extubated and updated her on pt condition. She will visit with pt later today.

## 2015-06-21 NOTE — Progress Notes (Signed)
[place] NOTE  Pharmacy Consult for Electrolytes/CRRT dose adjustment    Allergies  Allergen Reactions  . Penicillins Nausea And Vomiting    Patient Measurements: Height: 5\' 8"  (172.7 cm) Weight: 247 lb 12.8 oz (112.4 kg) IBW/kg (Calculated) : 68.4   Vital Signs: Temp: 98.1 F (36.7 C) (08/25 0700) Temp Source: Other (Comment) (08/25 0800) BP: 118/68 mmHg (08/25 0818) Pulse Rate: 71 (08/25 0818) Intake/Output from previous day: 08/24 0701 - 08/25 0700 In: 2391.2 [I.V.:426.7; NG/GT:964.5; IV Piggyback:1000] Out: 5541 [Urine:77] Intake/Output from this shift: Total I/O In: 359.6 [I.V.:49.6; NG/GT:60; IV Piggyback:250] Out: 485 [Other:485] Vent settings for last 24 hours: Vent Mode:  [-] PRVC FiO2 (%):  [35 %] 35 % Set Rate:  [24 bmp] 24 bmp Vt Set:  [500 mL] 500 mL PEEP:  [5 cmH20] 5 cmH20 Pressure Support:  [12 cmH20-16 cmH20] 12 cmH20  Labs:  Recent Labs  06/20/15 0439 06/20/15 0825 06/20/15 1030 06/21/15 0026 06/21/15 0507 06/21/15 0810  WBC 8.4  --   --   --  8.3  --   HGB 12.3*  --   --   --  11.3*  --   HCT 37.0*  --   --   --  34.6*  --   PLT 40*  --   --   --  34*  --   CREATININE 1.11  --  1.18 1.23  --  1.26*  MG  --  1.0* 1.7 1.9  --   --   PHOS  --   --  2.2* 2.1*  --  2.0*  ALBUMIN 3.5  --  3.3* 3.4*  --  3.5  PROT 6.5  --   --   --   --   --   AST 23  --   --   --   --   --   ALT 19  --   --   --   --   --   ALKPHOS 53  --   --   --   --   --   BILITOT 3.3*  --   --   --   --   --    Estimated Creatinine Clearance: 73.9 mL/min (by C-G formula based on Cr of 1.26).   Recent Labs  06/21/15 0010 06/21/15 0413 06/21/15 0748  GLUCAP 128* 123* 126*   BMP Latest Ref Rng 06/21/2015 06/21/2015 06/20/2015  Glucose 65 - 99 mg/dL 147(H) 144(H) 137(H)  BUN 6 - 20 mg/dL 27(H) 25(H) 18  Creatinine 0.61 - 1.24 mg/dL 1.26(H) 1.23 1.18  Sodium 135 - 145 mmol/L 139 139 140  Potassium 3.5 - 5.1 mmol/L 4.4 4.7 4.5  Chloride  101 - 111 mmol/L 105 105 106  CO2 22 - 32 mmol/L 28 28 25   Calcium 8.9 - 10.3 mg/dL 9.0 9.0 9.1     Microbiology: Recent Results (from the past 720 hour(s))  Blood Culture (routine x 2)     Status: None   Collection Time: 06/10/15  2:35 PM  Result Value Ref Range Status   Specimen Description BLOOD RIGHT WRIST  Final   Special Requests BOTTLES DRAWN AEROBIC AND ANAEROBIC  1CC  Final   Culture NO GROWTH 5 DAYS  Final   Report Status 06/15/2015 FINAL  Final  Blood Culture (routine x 2)     Status: None   Collection Time: 06/10/15  2:40 PM  Result Value Ref Range Status   Specimen Description BLOOD LEFT ASSIST  CONTROL  Final   Special Requests   Final    BOTTLES DRAWN AEROBIC AND ANAEROBIC  AER 6CC ANA 4CC   Culture NO GROWTH 5 DAYS  Final   Report Status 06/15/2015 FINAL  Final  Urine culture     Status: None   Collection Time: 06/10/15  4:42 PM  Result Value Ref Range Status   Specimen Description URINE, RANDOM  Final   Special Requests NONE  Final   Culture 4,000 COLONIES/mL INSIGNIFICANT GROWTH  Final   Report Status 06/12/2015 FINAL  Final  MRSA PCR Screening     Status: None   Collection Time: 06/10/15  6:27 PM  Result Value Ref Range Status   MRSA by PCR NEGATIVE NEGATIVE Final    Comment:        The GeneXpert MRSA Assay (FDA approved for NASAL specimens only), is one component of a comprehensive MRSA colonization surveillance program. It is not intended to diagnose MRSA infection nor to guide or monitor treatment for MRSA infections.   Culture, sputum-assessment     Status: None   Collection Time: 06/11/15  2:50 PM  Result Value Ref Range Status   Specimen Description SPUTUM  Final   Special Requests NONE  Final   Sputum evaluation THIS SPECIMEN IS ACCEPTABLE FOR SPUTUM CULTURE  Final   Report Status 06/13/2015 FINAL  Final  Culture, respiratory (NON-Expectorated)     Status: None   Collection Time: 06/11/15  2:50 PM  Result Value Ref Range Status    Specimen Description SPUTUM  Final   Special Requests NONE Reflexed from [id number]  Final   Gram Stain   Final    MODERATE WBC SEEN RARE GRAM POSITIVE COCCI GOOD SPECIMEN - 80-90% WBCS    Culture MODERATE GROWTH STAPHYLOCOCCUS AUREUS  Final   Report Status 06/14/2015 FINAL  Final   Organism ID, Bacteria STAPHYLOCOCCUS AUREUS  Final      Susceptibility   Staphylococcus aureus - MIC*    CIPROFLOXACIN <=0.5 SENSITIVE Sensitive     GENTAMICIN <=0.5 SENSITIVE Sensitive     OXACILLIN 0.5 SENSITIVE Sensitive     TRIMETH/SULFA <=10 SENSITIVE Sensitive     CEFOXITIN SCREEN NEGATIVE Sensitive     Inducible Clindamycin NEGATIVE Sensitive     TETRACYCLINE Value in next row Sensitive      SENSITIVE<=1    * MODERATE GROWTH STAPHYLOCOCCUS AUREUS  Wound culture     Status: None   Collection Time: 06/13/15  1:56 PM  Result Value Ref Range Status   Specimen Description LEG  Final   Special Requests Normal  Final   Gram Stain   Final    FEW WBC SEEN MODERATE GRAM NEGATIVE RODS FEW GRAM POSITIVE COCCI    Culture   Final    LIGHT GROWTH STAPHYLOCOCCUS AUREUS RARE GROWTH PROTEUS PENNERI RARE GROWTH ENTEROCOCCUS FAECALIS    Report Status 06/17/2015 FINAL  Final   Organism ID, Bacteria STAPHYLOCOCCUS AUREUS  Final   Organism ID, Bacteria PROTEUS PENNERI  Final   Organism ID, Bacteria ENTEROCOCCUS FAECALIS  Final      Susceptibility   Staphylococcus aureus - MIC*    CIPROFLOXACIN <=0.5 SENSITIVE Sensitive     GENTAMICIN <=0.5 SENSITIVE Sensitive     OXACILLIN 0.5 SENSITIVE Sensitive     TRIMETH/SULFA <=10 SENSITIVE Sensitive     CEFOXITIN SCREEN NEGATIVE Sensitive     Inducible Clindamycin NEGATIVE Sensitive     TETRACYCLINE Value in next row Sensitive      SENSITIVE<=1    *  LIGHT GROWTH STAPHYLOCOCCUS AUREUS   Proteus penneri - MIC*    AMPICILLIN Value in next row Resistant      SENSITIVE<=1    CEFAZOLIN Value in next row Resistant      SENSITIVE<=1    CEFTRIAXONE Value in next row  Sensitive      SENSITIVE<=1    CIPROFLOXACIN Value in next row Sensitive      SENSITIVE<=1    GENTAMICIN Value in next row Sensitive      SENSITIVE<=1    IMIPENEM Value in next row Sensitive      SENSITIVE<=1    NITROFURANTOIN Value in next row Resistant      SENSITIVE<=1    TRIMETH/SULFA Value in next row Sensitive      SENSITIVE<=1    PIP/TAZO Value in next row Sensitive      SENSITIVE<=4    * RARE GROWTH PROTEUS PENNERI   Enterococcus faecalis - MIC*    AMPICILLIN Value in next row Sensitive      SENSITIVE<=4    LINEZOLID Value in next row Sensitive      SENSITIVE<=4    * RARE GROWTH ENTEROCOCCUS FAECALIS  Wound culture     Status: None   Collection Time: 06/13/15  1:58 PM  Result Value Ref Range Status   Specimen Description LEG  Final   Special Requests Normal  Final   Gram Stain RARE WBC SEEN FEW GRAM NEGATIVE RODS   Final   Culture NO GROWTH 4 DAYS  Final   Report Status 06/17/2015 FINAL  Final    Medications:  Scheduled:  . albumin human  12.5 g Intravenous Q6H  . ampicillin-sulbactam (UNASYN) IV  3 g Intravenous Q8H  . antiseptic oral rinse  7 mL Mouth Rinse QID  . chlorhexidine gluconate  15 mL Mouth Rinse BID  . ciprofloxacin  400 mg Intravenous Q12H  . clonazePAM  0.25 mg Oral BID  . famotidine  20 mg Oral Q48H  . feeding supplement (PRO-STAT SUGAR FREE 64)  30 mL Oral TID  . ipratropium-albuterol  3 mL Nebulization Q6H  . metoprolol tartrate  25 mg Oral Q6H  . sodium phosphate  Dextrose 5% IVPB  30 mmol Intravenous Once    Assessment: 64 y/o M ventilated, sedated on CRRT.   Phos is low today.   Plan:  Will replace phos with 30 mmol NaPhos x 1. No medications require adjustment for CRRT at present. Will f/u AM labs and adjust as necessary.   Ulice Dash, PharmD 06/21/2015,9:32 AM

## 2015-06-21 NOTE — Progress Notes (Signed)
[facility] [place] NAME: Aaron Harrison    MR#:  [id number]  DATE OF BIRTH:  09-Feb-1951  SUBJECTIVE:  Finally extubated. Conversing ell, no complaints. CRRT +   Review of Systems  Constitutional: Negative for fever, chills and weight loss.  HENT: Negative for ear discharge, ear pain and nosebleeds.   Eyes: Negative for blurred vision, pain and discharge.  Respiratory: Negative for sputum production, shortness of breath, wheezing and stridor.   Cardiovascular: Negative for chest pain, palpitations, orthopnea and PND.  Gastrointestinal: Negative for nausea, vomiting, abdominal pain and diarrhea.  Genitourinary: Negative for urgency and frequency.  Musculoskeletal: Negative for back pain and joint pain.  Neurological: Positive for weakness. Negative for sensory change, speech change and focal weakness.  Psychiatric/Behavioral: Negative for depression and hallucinations. The patient is not nervous/anxious.      DRUG ALLERGIES:   Allergies  Allergen Reactions  . Penicillins Nausea And Vomiting    VITALS:  Blood pressure 123/79, pulse 77, temperature 98.2 F (36.8 C), temperature source Other (Comment), resp. rate 28, height 5\' 8"  (1.727 m), weight 112.4 kg (247 lb 12.8 oz), SpO2 100 %.  PHYSICAL EXAMINATION:   Physical Exam  GENERAL:  64 y.o.-year-old patient lying in the bed with no acute distress. critically ill anasarca EYES: Pupils equal, round, reactive to light and accommodation. No scleral icterus. Extraocular muscles intact.  HEENT: Head atraumatic, normocephalic. Oropharynx and nasopharynx clear.  NECK:  Supple, no jugular venous distention. No thyroid enlargement, no tenderness.  LUNGS: Normal breath sounds bilaterally, no wheezing, rales, rhonchi. No use of accessory muscles of respiration. Left upper chest CL+ CARDIOVASCULAR: S1, S2 normal. No murmurs, rubs, or gallops.  ABDOMEN: Soft, nontender, nondistended. Bowel  sounds present. No organomegaly or mass.  -severe scrotal edema EXTREMITIES: bilateral +++ edema b/l with weeping skin ulcers on tibial shin NEUROLOGIC: sedated and on the vent PSYCHIATRIC: on the vent SKIN:bilateral LE cellulitis and weeping skin ulcers over the tibial shin  LABORATORY PANEL:   CBC  Recent Labs Lab 06/21/15 0507  WBC 8.3  HGB 11.3*  HCT 34.6*  PLT 34*    Chemistries   Recent Labs Lab 06/20/15 0439  06/21/15 0810  NA 138  < > 139  K 4.7  < > 4.4  CL 105  < > 105  CO2 27  < > 28  GLUCOSE 130*  < > 147*  BUN 17  < > 27*  CREATININE 1.11  < > 1.26*  CALCIUM 8.9  < > 9.0  MG  --   < > 1.8  AST 23  --   --   ALT 19  --   --   ALKPHOS 53  --   --   BILITOT 3.3*  --   --   < > = values in this interval not displayed.  Cardiac Enzymes No results for input(s): TROPONINI in the last 168 hours.  RADIOLOGY:  Dg Chest Port 1 View  06/20/2015   CLINICAL DATA:  Respiratory failure.  EXAM: PORTABLE CHEST - 1 VIEW  COMPARISON:  06/18/2015.  FINDINGS: Endotracheal tube, NG tube, left subclavian line in stable position. Cardiomegaly with progressive bilateral pulmonary alveolar infiltrates and pleural effusions noted. Findings consistent with progressive congestive heart failure. Bilateral pneumonia cannot be excluded. No pneumothorax.  IMPRESSION: 1. Lines and tubes in stable position. 2. Cardiomegaly with progressive bilateral pulmonary alveolar infiltrates and pleural effusions consistent congestive heart failure. Bilateral pneumonia cannot be excluded.  Electronically Signed   By: Marcello Moores  Register   On: 06/20/2015 07:19   ASSESSMENT AND PLAN:  64 y.o. male with a known history of A. fib, CHF came in with  * Acute respiratory failure with hypoxia: intubated in ED, extubated on August 15 - Reintubated on August 17 due to increased work of breathing. -extubated now and doing well  * Sepsis with multiorgan failure: Likely hospital-acquired pneumonia -BC  negative -wbc stable -no fever  * MSSA Pneumonia- on  IV Unasyn  * Acute renal failure: Possible cardiorenal syndrome due to underlying CHF. Appreciate nephrology input - temporary dialysis catheter placed in the right groin and CRRT started 8/17  * Acute on chronic systolic CHF  Echo showing ejection fraction 30% to 35% -CRRT with  UF -pt has  severe anasarca  * A. fib with RVR:  Not able to use rate controlling medication due to hypotension. - On po  amiodarone-Continue aspirin for now  * Elevated troponin: Due to supply demand ischemia  * Bilateral lower extremity cellulitis: change to IV cipro given proteus in the wound Seen by wound care RN.   * Hyperkalemia: Resolved  * Ascites/Abnormal liver function test: GI following, on lactulose  Case discussed with Care Management/Social Worker. CODE STATUS: DNR DVT Prophylaxis: sq heparin  TOTAL Critical TIME TAKING CARE OF THIS PATIENT: 24minutes.  >50% time spent on counselling and coordination of care with RN, Dr Carlene Coria M.D on 06/21/2015 at 3:12 PM  Between 7am to 6pm - Pager - [phone]  After 6pm go to [email or web address] - password [email or web address] [facility]  [facility]  Office  [phone]  CC: Primary care physician; No PCP Per Patient

## 2015-06-21 NOTE — Progress Notes (Addendum)
Patient: Aaron Harrison / Admit Date: 06/10/2015 / Date of Encounter: 06/21/2015, 8:07 AM   Subjective: Intubated, eyes respond to verbal commands this morning. Upon presentation to hospital patient with history of chronic Afib and was found to be in Afib with RVR and hypotensive requiring starting of IV amiodarone for rate control as he was in acute respiratory failure with sepsis and found to have acute on chronic systolic CHF. It was felt, give his cardiomyopathy, he would tolerate a better controlled heart rate with amiodarone as we were unable to use rate controlling agent such as BB or CCB 2/2 hypotension or digoxin given he was in acute renal failure. His heart rate responded quite well on amiodarone. Amiodarone gtt was continued until 8/20 in the setting of patient being extubated and re-intubated on 8/17. On 8/20 he was changed from amiodarone gtt to po amiodarone 400 mg bid.  He was changed from amiodarone to metoprolol on 8/24 at the request of PCCM and palliative care.  Unfortunately, he developed Afib with RVR with 11 beats of NSVT after this change requiring up titration of his metoprolol.  His BP has improved quite nicely. He is now back in rate controlled Afib and intubated.   Review of Systems: Review of Systems  Unable to perform ROS: mental acuity     Objective: Telemetry: Afib, 70's, went back into Afib with RVR overnight into the 105's with 11 beats of NSVT Physical Exam: Blood pressure 127/86, pulse 68, temperature 98.4 F (36.9 C), temperature source Other (Comment), resp. rate 24, height 5\' 8"  (1.727 m), weight 247 lb 12.8 oz (112.4 kg), SpO2 94 %. Body mass index is 37.69 kg/(m^2). General: Well developed, well nourished, in no acute distress. Head: Normocephalic, atraumatic, sclera non-icteric, no xanthomas, nares are without discharge. Neck: Negative for carotid bruits. JVP not elevated. Lungs: Clear bilaterally to auscultation without wheezes, rales, or rhonchi.  Breathing is unlabored. Heart: Irregularly-irregular, S1 S2 without murmurs, rubs, or gallops.  Abdomen: Soft, non-tender, non-distended with normoactive bowel sounds. No rebound/guarding. Extremities: No clubbing or cyanosis. Trace  bilateral lower extremity edema with cellulitis. Ecchymosis and petechiae. Neuro: Intubated. Psych:  Intubated.   Intake/Output Summary (Last 24 hours) at 06/21/15 0807 Last data filed at 06/21/15 0600  Gross per 24 hour  Intake 2366.83 ml  Output   5106 ml  Net -2739.17 ml    Inpatient Medications:  . albumin human  12.5 g Intravenous Q6H  . ampicillin-sulbactam (UNASYN) IV  3 g Intravenous Q8H  . antiseptic oral rinse  7 mL Mouth Rinse QID  . chlorhexidine gluconate  15 mL Mouth Rinse BID  . ciprofloxacin  400 mg Intravenous Q12H  . clonazePAM  0.25 mg Oral BID  . famotidine  20 mg Oral Q48H  . feeding supplement (PRO-STAT SUGAR FREE 64)  30 mL Oral TID  . ipratropium-albuterol  3 mL Nebulization Q6H  . metoprolol tartrate  25 mg Oral Q6H  . sodium phosphate  Dextrose 5% IVPB  30 mmol Intravenous Once   Infusions:  . dexmedetomidine 0.8 mcg/kg/hr (06/21/15 0510)  . feeding supplement (VITAL HIGH PROTEIN) 1,000 mL (06/21/15 0522)  . pureflow 2,500 mL/hr at 06/21/15 0141    Labs:  Recent Labs  06/20/15 1030 06/21/15 0026  NA 140 139  K 4.5 4.7  CL 106 105  CO2 25 28  GLUCOSE 137* 144*  BUN 18 25*  CREATININE 1.18 1.23  CALCIUM 9.1 9.0  MG 1.7 1.9  PHOS 2.2* 2.1*  Recent Labs  06/18/15 0814  06/20/15 0439 06/20/15 1030 06/21/15 0026  AST 30  --  23  --   --   ALT 23  --  19  --   --   ALKPHOS  --   --  53  --   --   BILITOT  --   --  3.3*  --   --   PROT  --   --  6.5  --   --   ALBUMIN 2.3*  < > 3.5 3.3* 3.4*  < > = values in this interval not displayed.  Recent Labs  06/20/15 0439 06/21/15 0507  WBC 8.4 8.3  NEUTROABS  --  7.1*  HGB 12.3* 11.3*  HCT 37.0* 34.6*  MCV 102.3* 102.3*  PLT 40* 34*   No results  for input(s): CKTOTAL, CKMB, TROPONINI in the last 72 hours. Invalid input(s): POCBNP No results for input(s): HGBA1C in the last 72 hours.   Weights: Filed Weights   06/19/15 0400 06/20/15 0326 06/21/15 0429  Weight: 262 lb 9.1 oz (119.1 kg) 257 lb 11.5 oz (116.9 kg) 247 lb 12.8 oz (112.4 kg)     Radiology/Studies:  Dg Chest 1 View  06/18/2015   CLINICAL DATA:  Acute respiratory failure, community-acquired pneumonia, intubated patient.  EXAM: CHEST  1 VIEW  COMPARISON:  Portable chest x-ray of June 17, 2015  FINDINGS: The lungs are adequately inflated. The interstitial markings remain increased with areas of confluence noted particularly on the left. The retrocardiac region remains dense and the left hemidiaphragm remains obscured. The cardiac silhouette is enlarged. The endotracheal tube tip lies approximately 1.7 cm above the carina. The esophagogastric tube tip projects below the inferior margin of the image. The left subclavian venous catheter tip projects over the midportion of the SVC.  IMPRESSION: Cardiomegaly with mild interstitial prominence bilaterally. Alveolar edema or pneumonia on the left with retrocardiac atelectasis. The support apparatus is in stable position.   Electronically Signed   By: David  Martinique M.D.   On: 06/18/2015 07:39   Dg Chest 1 View  06/17/2015   CLINICAL DATA:  Patient with acute respiratory failure and community acquired pneumonia.  EXAM: CHEST  1 VIEW  COMPARISON:  Chest radiograph 06/15/2015  FINDINGS: ET tube terminates in the distal trachea. Left subclavian central venous catheter tip projects over the superior vena cava. Enteric tube courses inferior to the diaphragm. Multiple additional monitoring leads overlie the patient. Stable cardiomegaly. Low lung volumes. Diffuse bilateral interstitial pulmonary opacities. Small bilateral pleural effusions. No definite pneumothorax.  IMPRESSION: Stable support apparatus.  Cardiomegaly and mild interstitial edema.    Electronically Signed   By: Lovey Newcomer M.D.   On: 06/17/2015 09:15   Dg Abd 1 View  06/18/2015   CLINICAL DATA:  Followup abdominal distension  EXAM: ABDOMEN - 1 VIEW  COMPARISON:  06/17/2015  FINDINGS: A right femoral dual lumen catheter is again seen and stable. Scattered large and small bowel gas is noted. No obstructive changes are seen. Bony structures show degenerative change of the lumbar spine.  IMPRESSION: No acute abnormality noted.   Electronically Signed   By: Inez Catalina M.D.   On: 06/18/2015 11:57   Dg Abd 1 View  06/17/2015   CLINICAL DATA:  Abdominal distention.  Sepsis.  Respiratory failure.  EXAM: ABDOMEN - 1 VIEW  COMPARISON:  06/13/2015  FINDINGS: Gaseous distention is seen involving the nondependent portions of the colon, mainly the transverse colon, consistent with adynamic ileus. Cecum  measures approximately 9.4 cm in diameter. No dilated small bowel loops seen. Catheter is noted within the bladder. Right femoral venous catheter also noted.  IMPRESSION: Moderate colonic ileus.   Electronically Signed   By: Earle Gell M.D.   On: 06/17/2015 08:40   Dg Abd 1 View  06/13/2015   CLINICAL DATA:  Status post OG tube placement.  EXAM: ABDOMEN - 1 VIEW  COMPARISON:  Single view of the abdomen 06/10/2015.  FINDINGS: OG tube is in place with the tip in the distal stomach. The bowel gas pattern is unremarkable.  IMPRESSION: OG tube tip is in the distal stomach.   Electronically Signed   By: Inge Rise M.D.   On: 06/13/2015 11:32   Dg Abd 1 View  06/10/2015   CLINICAL DATA:  64 year old male enteric tube placement. Initial encounter.  EXAM: ABDOMEN - 1 VIEW  COMPARISON:  Portable chest radiograph 1556 hours today.  FINDINGS: Portable AP supine view at 1552 hours. There is no enteric tube visible in the abdomen. There is gaseous distension of the stomach. Non obstructed bowel gas pattern otherwise. On the recent portable chest tubing was looped over the lower neck.  IMPRESSION: 1.  Strongly suspect the enteric tube is looped in the pharynx, visible only on the portable chest radiograph from 1556 hours. Recommend removal and replacement. 2. Gaseous distension of the stomach.   Electronically Signed   By: Genevie Ann M.D.   On: 06/10/2015 16:32   US Abdomen Complete  06/16/2015   CLINICAL DATA:  Patient with liver and kidney disease. Respiratory failure.  EXAM: ULTRASOUND ABDOMEN COMPLETE  COMPARISON:  None.  FINDINGS: Gallbladder: Probable sludge within the gallbladder lumen. No gallbladder wall thickening. No pericholecystic fluid. Unable to assess for sonographic Murphy's sign.  Common bile duct: Diameter: Approximately 3 mm  Liver: No focal lesion identified. Within normal limits in parenchymal echogenicity.  IVC: No abnormality visualized.  Pancreas: Not visualized due to overlying bowel gas and body habitus.  Spleen: Size and appearance within normal limits.  Right Kidney: Length: 14.2 cm. No hydronephrosis. There is a 7 cm cyst off the superior pole.  Left Kidney: Length: 12.4 cm. No hydronephrosis. There is a 2.2 cm hypoechoic lesion within the interpolar region of the left kidney, incompletely characterized.  Abdominal aorta: Not well visualized.  Other findings: None.  IMPRESSION: Possible sludge within the gallbladder lumen. No definite sonographic evidence to suggest acute cholecystitis.  No hydronephrosis.  Indeterminate hypoechoic mass within the interpolar region left kidney, potentially representing a complicated cyst. Solid mass is not excluded. Consider correlation with dedicated cross-sectional imaging when patient clinically able.   Electronically Signed   By: Lovey Newcomer M.D.   On: 06/16/2015 18:21   Dg Chest Port 1 View  06/20/2015   CLINICAL DATA:  Respiratory failure.  EXAM: PORTABLE CHEST - 1 VIEW  COMPARISON:  06/18/2015.  FINDINGS: Endotracheal tube, NG tube, left subclavian line in stable position. Cardiomegaly with progressive bilateral pulmonary alveolar  infiltrates and pleural effusions noted. Findings consistent with progressive congestive heart failure. Bilateral pneumonia cannot be excluded. No pneumothorax.  IMPRESSION: 1. Lines and tubes in stable position. 2. Cardiomegaly with progressive bilateral pulmonary alveolar infiltrates and pleural effusions consistent congestive heart failure. Bilateral pneumonia cannot be excluded.   Electronically Signed   By: Marcello Moores  Register   On: 06/20/2015 07:19   Dg Chest Port 1 View  06/15/2015   CLINICAL DATA:  Intubated patient, acute respiratory failure, community-acquired pneumonia, CHF.  EXAM:  PORTABLE CHEST - 1 VIEW  COMPARISON:  Portable chest x-ray of June 13, 2015  FINDINGS: The lungs are mildly hypoinflated. The interstitial markings are increased bilaterally. The retrocardiac region remains dense. There is no pneumothorax or significant pleural effusion. The cardiac silhouette remains enlarged. The pulmonary vascularity is engorged. The endotracheal tube tip lies 3.2 cm above the carina. The esophagogastric tube tip projects below the inferior margin of the image. The left subclavian venous catheter tip projects over the midportion of the SVC.  IMPRESSION: Slight interval improvement in pulmonary interstitial edema despite mild hypo inflation. The support tubes are in reasonable position.   Electronically Signed   By: David  Martinique M.D.   On: 06/15/2015 08:45   Dg Chest Port 1 View  06/13/2015   CLINICAL DATA:  Hypoxia  EXAM: PORTABLE CHEST - 1 VIEW  COMPARISON:  June 12, 2015  FINDINGS: Endotracheal tube tip is at the carina. Central catheter tip is in the superior cava. Nasogastric tube tip and side port are below the diaphragm. No pneumothorax. There is alveolar edema throughout much of the right lung. There is also alveolar edema in both lower lobes. Heart is enlarged with pulmonary venous hypertension. Suspect small layering effusion on the right. No adenopathy.  IMPRESSION: Tube and catheter  positions as described without pneumothorax. Note that the endotracheal tube tip is at the carina.  Congestive heart failure with persistent areas of alveolar edema. Stable cardiomegaly. Suspect layering effusion on the right.  Critical Value/emergent results were called by telephone at the time of interpretation on 06/13/2015 at 10:17 am to Delton See, RN , who verbally acknowledged these results.   Electronically Signed   By: Lowella Grip III M.D.   On: 06/13/2015 10:18   Dg Chest Port 1 View  06/12/2015   CLINICAL DATA:  Shortness of breath.  EXAM: PORTABLE CHEST - 1 VIEW  COMPARISON:  06/11/2015.  FINDINGS: Interim removal of endotracheal tube. Left subclavian line in good anatomic position. Cardiomegaly. Progressive bilateral pulmonary infiltrates. Small left pleural effusion. No pneumothorax.  IMPRESSION: 1. Interim removal of endotracheal tube and NG tube. Left sub plate central line in stable position. 2. Cardiomegaly with pulmonary venous congestion and significant interim progression of bilateral pulmonary infiltrates. Persistent left pleural effusion. Findings are consistent with progressive congestive heart failure. Underlying pneumonia cannot be excluded.   Electronically Signed   By: Marcello Moores  Register   On: 06/12/2015 07:41   Dg Chest Port 1 View  06/11/2015   CLINICAL DATA:  Central line placement.  Initial encounter.  EXAM: PORTABLE CHEST - 1 VIEW  COMPARISON:  Chest radiograph performed 06/10/2015  FINDINGS: The patient's endotracheal tube is seen ending 3 cm above the carina. A left subclavian line is noted ending about the distal SVC.  A small left pleural effusion is noted. Right upper lobe airspace opacification is concerning for pneumonia, similar in appearance to the recent prior study. Underlying vascular congestion is noted. No pneumothorax is seen  The cardiomediastinal silhouette is enlarged. No acute osseous abnormalities are identified.  IMPRESSION: 1. Endotracheal tube seen  ending 3 cm above the carina. 2. Left subclavian line noted ending about the distal SVC. 3. Right upper lobe airspace opacification, compatible with pneumonia, similar in appearance to the prior study. 4. Small left pleural effusion noted. Cardiomegaly and vascular congestion.   Electronically Signed   By: Garald Balding M.D.   On: 06/11/2015 02:08   Dg Chest Port 1 View  06/10/2015   CLINICAL DATA:  Status post intubation  EXAM: PORTABLE CHEST - 1 VIEW  COMPARISON:  06/10/2015  FINDINGS: Cardiac shadow remains enlarged. A nasogastric catheter and endotracheal tube are again seen. The endotracheal tube is been withdrawn and now lies in satisfactory position. Increasing consolidation within the right upper lobe is noted. Some patchy changes are noted in the left lung base as well. The left changes are stable from the recent exam.  IMPRESSION: Increasing right upper lobe consolidation.   Electronically Signed   By: Inez Catalina M.D.   On: 06/10/2015 19:05   Dg Chest Portable 1 View  06/10/2015   ADDENDUM REPORT: 06/10/2015 16:41  ADDENDUM: Study discussed by telephone with Dr. Hinda Kehr on 06/10/2015 at 1636 hours.  We also discussed that the enteric tube appears to be looped in the pharynx/neck on this image.  He advises that both the ET tube and enteric tube have been repositioned, and a repeat abdominal film is pending.   Electronically Signed   By: Genevie Ann M.D.   On: 06/10/2015 16:41   06/10/2015   CLINICAL DATA:  64 year old male intubated. Tachypnea. Initial encounter.  EXAM: PORTABLE CHEST - 1 VIEW  COMPARISON:  1442 hours today, and earlier  FINDINGS: Portable AP supine view at 1556 hours. Endotracheal tube tip at the carina. Decreased ventilation at the left lung base. Stable cardiomegaly and mediastinal contours. Mild crowding of markings elsewhere.  IMPRESSION: 1. Intubated, endotracheal tube tip at the carina. Retract up to 3 cm 4 Mar I optimal placement. 2. Left greater than right perihilar  increased atelectasis.  Electronically Signed: By: Genevie Ann M.D. On: 06/10/2015 16:31   Dg Chest Port 1 View  06/10/2015   CLINICAL DATA:  Tachypnea, shallow breathing.  EXAM: PORTABLE CHEST - 1 VIEW  COMPARISON:  April 08, 2015.  FINDINGS: Stable cardiomegaly. No pneumothorax is noted. Increased central pulmonary vascular congestion is noted. Mild bibasilar opacities are noted concerning for subsegmental atelectasis, edema or effusions. Increased right upper lobe opacity is noted concerning for possible pneumonia or edema. Bony thorax is intact.  IMPRESSION: Cardiomegaly and increased central pulmonary vascular congestion is noted, with increased right upper lobe opacity concerning for pneumonia or possibly edema. Mild bibasilar opacities are noted concerning for subsegmental atelectasis or edema with possible associated minimal pleural effusions.   Electronically Signed   By: Marijo Conception, M.D.   On: 06/10/2015 15:08   Dg Abd Portable 1v  06/10/2015   CLINICAL DATA:  OG tube placement.  EXAM: PORTABLE ABDOMEN - 1 VIEW 4:31 p.m.  COMPARISON:  06/10/2015 3:52 p.m.  FINDINGS: There is an OG tube with tip in the fundus of the stomach. Endotracheal tube appears in good position 3 cm above the carina.  No visible dilated bowel. Pulmonary infiltrates noted in the right upper lobe and left lower lobe.  IMPRESSION: OG tube tip in the fundus of the stomach.   Electronically Signed   By: Lorriane Shire M.D.   On: 06/10/2015 16:51     Assessment and Plan  1. Chronic atrial fibrillation: initially with RVR, controlled on amiodarone drip since 8/15, changed to po amiodarone 400 mg bid on 8/20 in the setting of hypotension. Discussed case with PCCM and palliative care on 8/24. Would prefer rate controlling agent at this time. Discontinued PO amiodarone on 8/24 and started low dose metoprolol 12.5 mg bid with hold parameters. Unfortunately, he he episodes of Afib with RVR and 11 beats of NSVT overnight requiring up  titration of his  metoprolol to 25 mg qid. He is at high risk of further malignant cardiac arrhythmias and would have a low threshold of restarting amiodarone at this point if there is going to be continued care. Low risk of amiodarone lung, though it certainly could be playing a role. Would be a diagnosis of exlcusion and would need a bronch to evaluate for foam cells. Treatment would be prednisone for months. There is discussion with his cousin with Palliative care regarding possible cessation of care in the near future. Because of this, his thrombocytopenia, and his history of liver disease he would not be a candidate for long term full dose anticoagulation.   2. Acute on chronic systolic heart failure, LVEF 35%, in setting multiorgan failure. Metoprolol added as above. If he is able to be successfully extubated could consider Entresto at a later date along with ischemic work up.   3. Acute respiratory failure, MSSA in the sputum, Enterococcus on resp cx. On ABX per PCCM and IM. Intubated. High risk of re-intubation if he is extubated. Wean as tolerated.   4. Acute renal failure, stable  5. Hospital acquired pneumonia, as above  6. Coagulopathy; PLT count continues to drop  7. Dispo, patient with overall poor prognosis and is of high risk of cardiopulmonary death    Melvern Banker Jul 10, 2015, 8:07 AM   Attending Note Patient seen and examined, agree with detailed note above,  Patient presentation and plan discussed on rounds.   Currently extubated, stable on nasal canula oxygen NSVT last night at 5:50 pm, no further VT since then. On metoprolol. Amio held. --Would monitor tele closely, If he has additional long episodes of NSVT, would consider restarting amiodarone by mouth.  --Discussed with nurses. They will call if tele shows arrhythmia.   Signed: Esmond Plants  M.D., Ph.D.

## 2015-06-21 NOTE — Progress Notes (Signed)
Pt. Remains alert.  Will follow commands.  Precedex now at .8 mcg/kg/hr.  Pt has remained calm and has not since the beginning of the shift tried to pull the et tube out.  Pt will answer yes or no questions appropriately.  Pt complained of discomfort one time and fentanyl was given as ordered.  Pt has remained afib on the cardiac monitor.  Lungs have been clear/diminished.  Bowel sounds are present.  Flexi seal remains in place with a small, clay colored output.  Foley remains in place with 30 cc out.  Pt had multiple visitors during the night from the [employer].  Names and numbers were left in the chart on sticky notes and business cards.  Pt has rested comfortably during the night.  CRRT has run without incident.

## 2015-06-21 NOTE — Progress Notes (Signed)
Completed bedside swallow eval. Pt tolerated ice chips and water with no issues or complications.

## 2015-06-21 NOTE — Progress Notes (Addendum)
[facility] Progess Note     ASSESSMENAT/PLAN   64 yo white male admitted to ICU for acute  hypercapnic and hypoxic resp failure from acute CHF exacerbation with acute liver dysfunction/liver failure with acute renal failure with acute encephalopathy with mixed resp and metabolic acidosis with recurrent resp failure. Pt grew MSSA in sputum, suspect pneumonia.  Pt failed extubation and reintubated. Now becoming more awake but still with difficult wean due to mentation and vent dependence  PULMONARY 1.Respiratory Failure-patient reintubated  due to worsening cardio-renal syndrome - Did not tolerate vent weaning yesterday. We'll attempt again today. - Limit sedation -MSSA in sputum. Possible pneumonia, on antibiotics.  Enterococcus on resp cx, now on unasyn -continue Bronchodilator Therapy -Wean Fio2 and PEEP as tolerated - cont with even I/O status -check abg prn  CARDIOVASCULAR ACUTE CHF, chronic atrial fibrillation with RVR, now controlled.  -History of systolic congestive heart failure with an ejection fraction of 35% -history of afib - started on IV amiodarone due to BP issues, now with stable Bp. - continue to have massive edema  RENAL Renal Failure-most likely due to ATN, currently on CRRT -follow chem 7 -follow UO -continue Foley Catheter-assess need -vasc cath placed for CRRT-nephrology consulted - continue to have generalized edema  -placed on intermittent albumin by nephro   GASTROINTESTINAL GI Prophylaxic -OG placed-on TF's - U/S of RUL with GB sludge but no fluid noted, no leukocytosis - hx of ? Liver dysfunction,    HEMATOLOGIC Follow h/h  INFECTIOUS ?cellulitis of LE-wound culture positive for multiple organisms including E faecalis MSSA, and Proteus. -on unasyn -Sputum culture positive for MSSA. Continue unasyn for MSSA pneumonia, ID following.   ENDOCRINE - ICU hypoglycemic\Hyperglycemia protocol   NEUROLOGIC -  intubated and sedated - minimal sedation to achieve a RASS goal: -1     -Patient was discussed with the critical care RN. Continue current vent support. Patient will attempt sedation vacation again, vent wean as tolerated, cont with crrt. - palliative to reassess again tomorrow.   -DVT prophylaxis, lower extremity venous avoided due to large ulcers, patient on heparin 5000 subcutaneous twice daily. -Gi Prophylaxis, on tube feeds, Famotidine.  ----------------------------------------   Name: Aaron Harrison MRN: [id number] DOB: Nov 06, 1950    ADMISSION DATE:  06/10/2015  SUBJECTIVE:   Pt currently on the ventilator, following simple commands, more alert this morning.   Review of Systems:  Pt currently on the ventilator, can not provide history or review of systems.    VITAL SIGNS: Temp:  [98.1 F (36.7 C)-99.9 F (37.7 C)] 98.1 F (36.7 C) (08/25 0700) Pulse Rate:  [66-129] 71 (08/25 0818) Resp:  [22-33] 24 (08/25 0700) BP: (110-158)/(68-112) 118/68 mmHg (08/25 0818) SpO2:  [88 %-100 %] 94 % (08/25 0700) FiO2 (%):  [35 %] 35 % (08/25 0723) Weight:  [112.4 kg (247 lb 12.8 oz)] 112.4 kg (247 lb 12.8 oz) (08/25 0429) HEMODYNAMICS:   VENTILATOR SETTINGS: Vent Mode:  [-] PRVC FiO2 (%):  [35 %] 35 % Set Rate:  [24 bmp] 24 bmp Vt Set:  [500 mL] 500 mL PEEP:  [5 cmH20] 5 cmH20 Pressure Support:  [12 cmH20-16 cmH20] 12 cmH20 INTAKE / OUTPUT:  Intake/Output Summary (Last 24 hours) at 06/21/15 0902 Last data filed at 06/21/15 0827  Gross per 24 hour  Intake 2714.23 ml  Output   4914 ml  Net -2199.77 ml    PHYSICAL EXAMINATION: Physical Examination:   VS: BP 118/68 mmHg  Pulse 71  Temp(Src)  98.1 F (36.7 C) (Other (Comment))  Resp 24  Ht 5\' 8"  (1.727 m)  Wt 112.4 kg (247 lb 12.8 oz)  BMI 37.69 kg/m2  SpO2 94%  General Appearance: No distress  Neuro:without focal findings, mental status normal. HEENT: PERRLA, EOM intact. Pulmonary: coarse upper airway sounds, on the  MV.   CardiovascularNormal S1,S2.  No m/r/g.   Abdomen: Benign, Soft, non-tender. Renal:  No costovertebral tenderness  GU:  Not performed at this time. Endocrine: No evident thyromegaly. Skin:   Warm , anterior LE B/L skin wounds (clean based, no purulent drainage).  Extremities: normal, no cyanosis, clubbing.   LABS:  CBC  Recent Labs Lab 06/18/15 0408 06/20/15 0439 06/21/15 0507  WBC 5.1 8.4 8.3  HGB 10.9* 12.3* 11.3*  HCT 33.4* 37.0* 34.6*  PLT 34* 40* 34*   Coag's No results for input(s): APTT, INR in the last 168 hours. BMET  Recent Labs Lab 06/20/15 1030 06/21/15 0026 06/21/15 0810  NA 140 139 139  K 4.5 4.7 4.4  CL 106 105 105  CO2 25 28 28   BUN 18 25* 27*  CREATININE 1.18 1.23 1.26*  GLUCOSE 137* 144* 147*   Electrolytes  Recent Labs Lab 06/20/15 0825 06/20/15 1030 06/21/15 0026 06/21/15 0810  CALCIUM  --  9.1 9.0 9.0  MG 1.0* 1.7 1.9  --   PHOS  --  2.2* 2.1* 2.0*   Sepsis Markers No results for input(s): LATICACIDVEN, PROCALCITON, O2SATVEN in the last 168 hours. ABG  Recent Labs Lab 06/17/15 0408 06/18/15 0500 06/20/15 0424  PHART 7.38 7.41 7.44  PCO2ART 48 44 38  PO2ART 75* 76* 85   Liver Enzymes  Recent Labs Lab 06/18/15 0814  06/20/15 0439 06/20/15 1030 06/21/15 0026 06/21/15 0810  AST 30  --  23  --   --   --   ALT 23  --  19  --   --   --   ALKPHOS  --   --  53  --   --   --   BILITOT  --   --  3.3*  --   --   --   ALBUMIN 2.3*  < > 3.5 3.3* 3.4* 3.5  < > = values in this interval not displayed. Cardiac Enzymes No results for input(s): TROPONINI, PROBNP in the last 168 hours. Glucose  Recent Labs Lab 06/20/15 1125 06/20/15 1625 06/20/15 1957 06/21/15 0010 06/21/15 0413 06/21/15 0748  GLUCAP 136* 131* 111* 128* 123* 126*     No results found.   -Marda Stalker, M.D.   Critical Care Attestation.  I have personally obtained a history, examined the patient, evaluated laboratory and imaging results,  formulated the assessment and plan and placed orders. Case is discussed with the critical care RN nutrition ICU pharmacist, social worker, palliative care. The Patient requires high complexity decision making for assessment and support, frequent evaluation and titration of therapies, application of advanced monitoring technologies and extensive interpretation of multiple databases. The patient has critical illness that could lead imminently to failure of 1 or more organ systems and requires the highest level of physician preparedness to intervene.  Critical Care Time devoted to patient care services described in this note is 35 minutes and is exclusive of time spent in procedures.

## 2015-06-21 NOTE — Progress Notes (Signed)
Per dr. Juanell Fairly in rounds, change pt from 5/12 to 5/5 and complete abg in 30 mins. Vent settings changed and respiratory notified of abg order.

## 2015-06-21 NOTE — Progress Notes (Signed)
   06/21/15 1742  Clinical Encounter Type  Visited With Patient  Visit Type Initial  Referral From Nurse  Consult/Referral To Chaplain  Advance Directives (For Healthcare)  Does patient have an advance directive? Yes  Provided pastoral support and presence to patient.  Patient declined Engineer, materials.  He said his own attorney was coming in the morning to facilitate this with him. Pt thanked me for my visit.  [place] [phone]

## 2015-06-21 NOTE — Progress Notes (Signed)
Subjective:  Pt remains on CRRT. Remains oliguric. UF 250 cc/hr Abdomen distended Continues to have massive edema, but improving UF 5200 cc last 24 hrs Net neg by - 2900 cc Awake, spontaneous mode of ventilator   Objective:  Vital signs in last 24 hours:  Temp:  [98.1 F (36.7 C)-99.9 F (37.7 C)] 98.6 F (37 C) (08/25 1200) Pulse Rate:  [54-129] 54 (08/25 1200) Resp:  [21-33] 28 (08/25 1200) BP: (110-158)/(68-112) 113/76 mmHg (08/25 1200) SpO2:  [88 %-100 %] 98 % (08/25 1200) FiO2 (%):  [35 %] 35 % (08/25 0723) Weight:  [112.4 kg (247 lb 12.8 oz)] 112.4 kg (247 lb 12.8 oz) (08/25 0429)  Weight change: -4.5 kg (-9 lb 14.7 oz) Filed Weights   06/19/15 0400 06/20/15 0326 06/21/15 0429  Weight: 119.1 kg (262 lb 9.1 oz) 116.9 kg (257 lb 11.5 oz) 112.4 kg (247 lb 12.8 oz)    Intake/Output: I/O last 3 completed shifts: In: 4210.3 [I.V.:1025.8; NG/GT:1429.5; IV Piggyback:1755] Out: 9735 [Urine:117; Other:7777]   Intake/Output this shift:  Total I/O In: 764 [I.V.:124; NG/GT:390; IV Piggyback:250] Out: 605 [Other:605]  Physical Exam: General: Critically ill appearing  Head: Normocephalic, atraumatic. ETT in place  Eyes: anicteric  Neck: Supple, trachea midline  Lungs:  Bilateral rhonchi; vent assisted  Heart: Irregular S1S2 no rubs  Abdomen:  nontender, mild distension, tense  Extremities:  ++ generalized  edema  Neurologic:  able to nod yes/no questions   Skin: ulcertaion bilateral lower extremities on shins, ? jaundice   Access: Femoral dialysis catheter    Basic Metabolic Panel:  Recent Labs Lab 06/19/15 1700 06/19/15 2357 06/20/15 0439 06/20/15 0825 06/20/15 1030 06/21/15 0026 06/21/15 0810  NA 138 136 138  --  140 139 139  K 4.8 4.7 4.7  --  4.5 4.7 4.4  CL 104 104 105  --  106 105 105  CO2 26 26 27   --  25 28 28   GLUCOSE 124* 147* 130*  --  137* 144* 147*  BUN 16 16 17   --  18 25* 27*  CREATININE 1.12 1.24 1.11  --  1.18 1.23 1.26*  CALCIUM 9.0  8.6* 8.9  --  9.1 9.0 9.0  MG 1.8 1.7  --  1.0* 1.7 1.9 1.8  PHOS 2.2* 2.8  --   --  2.2* 2.1* 2.0*    Liver Function Tests:  Recent Labs Lab 06/18/15 0814  06/19/15 2357 06/20/15 0439 06/20/15 1030 06/21/15 0026 06/21/15 0810  AST 30  --   --  23  --   --   --   ALT 23  --   --  19  --   --   --   ALKPHOS  --   --   --  53  --   --   --   BILITOT  --   --   --  3.3*  --   --   --   PROT  --   --   --  6.5  --   --   --   ALBUMIN 2.3*  < > 3.1* 3.5 3.3* 3.4* 3.5  < > = values in this interval not displayed. No results for input(s): LIPASE, AMYLASE in the last 168 hours.  Recent Labs Lab 06/18/15 1115  AMMONIA 24    CBC:  Recent Labs Lab 06/16/15 0006 06/17/15 0423 06/18/15 0408 06/20/15 0439 06/21/15 0507  WBC 5.4 5.3 5.1 8.4 8.3  NEUTROABS  --   --   --   --  7.1*  HGB 11.0* 10.8* 10.9* 12.3* 11.3*  HCT 34.3* 33.9* 33.4* 37.0* 34.6*  MCV 104.1* 103.0* 103.3* 102.3* 102.3*  PLT 36* 33* 34* 40* 34*    Cardiac Enzymes: No results for input(s): CKTOTAL, CKMB, CKMBINDEX, TROPONINI in the last 168 hours.  BNP: Invalid input(s): POCBNP  CBG:  Recent Labs Lab 06/20/15 1957 06/21/15 0010 06/21/15 0413 06/21/15 0748 06/21/15 1143  GLUCAP 111* 128* 123* 126* 150*    Microbiology: Results for orders placed or performed during the hospital encounter of 06/10/15  Blood Culture (routine x 2)     Status: None   Collection Time: 06/10/15  2:35 PM  Result Value Ref Range Status   Specimen Description BLOOD RIGHT WRIST  Final   Special Requests BOTTLES DRAWN AEROBIC AND ANAEROBIC  [place]  Final   Culture NO GROWTH 5 DAYS  Final   Report Status 06/15/2015 FINAL  Final  Blood Culture (routine x 2)     Status: None   Collection Time: 06/10/15  2:40 PM  Result Value Ref Range Status   Specimen Description BLOOD LEFT ASSIST CONTROL  Final   Special Requests   Final    BOTTLES DRAWN AEROBIC AND ANAEROBIC  AER 6CC ANA 4CC   Culture NO GROWTH 5 DAYS  Final   Report  Status 06/15/2015 FINAL  Final  Urine culture     Status: None   Collection Time: 06/10/15  4:42 PM  Result Value Ref Range Status   Specimen Description URINE, RANDOM  Final   Special Requests NONE  Final   Culture 4,000 COLONIES/mL INSIGNIFICANT GROWTH  Final   Report Status 06/12/2015 FINAL  Final  MRSA PCR Screening     Status: None   Collection Time: 06/10/15  6:27 PM  Result Value Ref Range Status   MRSA by PCR NEGATIVE NEGATIVE Final    Comment:        The GeneXpert MRSA Assay (FDA approved for NASAL specimens only), is one component of a comprehensive MRSA colonization surveillance program. It is not intended to diagnose MRSA infection nor to guide or monitor treatment for MRSA infections.   Culture, sputum-assessment     Status: None   Collection Time: 06/11/15  2:50 PM  Result Value Ref Range Status   Specimen Description SPUTUM  Final   Special Requests NONE  Final   Sputum evaluation THIS SPECIMEN IS ACCEPTABLE FOR SPUTUM CULTURE  Final   Report Status 06/13/2015 FINAL  Final  Culture, respiratory (NON-Expectorated)     Status: None   Collection Time: 06/11/15  2:50 PM  Result Value Ref Range Status   Specimen Description SPUTUM  Final   Special Requests NONE Reflexed from [id number]  Final   Gram Stain   Final    MODERATE WBC SEEN RARE GRAM POSITIVE COCCI GOOD SPECIMEN - 80-90% WBCS    Culture MODERATE GROWTH STAPHYLOCOCCUS AUREUS  Final   Report Status 06/14/2015 FINAL  Final   Organism ID, Bacteria STAPHYLOCOCCUS AUREUS  Final      Susceptibility   Staphylococcus aureus - MIC*    CIPROFLOXACIN <=0.5 SENSITIVE Sensitive     GENTAMICIN <=0.5 SENSITIVE Sensitive     OXACILLIN 0.5 SENSITIVE Sensitive     TRIMETH/SULFA <=10 SENSITIVE Sensitive     CEFOXITIN SCREEN NEGATIVE Sensitive     Inducible Clindamycin NEGATIVE Sensitive     TETRACYCLINE Value in next row Sensitive      SENSITIVE<=1    * MODERATE GROWTH STAPHYLOCOCCUS AUREUS  Wound culture  Status:  None   Collection Time: 06/13/15  1:56 PM  Result Value Ref Range Status   Specimen Description LEG  Final   Special Requests Normal  Final   Gram Stain   Final    FEW WBC SEEN MODERATE GRAM NEGATIVE RODS FEW GRAM POSITIVE COCCI    Culture   Final    LIGHT GROWTH STAPHYLOCOCCUS AUREUS RARE GROWTH PROTEUS PENNERI RARE GROWTH ENTEROCOCCUS FAECALIS    Report Status 06/17/2015 FINAL  Final   Organism ID, Bacteria STAPHYLOCOCCUS AUREUS  Final   Organism ID, Bacteria PROTEUS PENNERI  Final   Organism ID, Bacteria ENTEROCOCCUS FAECALIS  Final      Susceptibility   Staphylococcus aureus - MIC*    CIPROFLOXACIN <=0.5 SENSITIVE Sensitive     GENTAMICIN <=0.5 SENSITIVE Sensitive     OXACILLIN 0.5 SENSITIVE Sensitive     TRIMETH/SULFA <=10 SENSITIVE Sensitive     CEFOXITIN SCREEN NEGATIVE Sensitive     Inducible Clindamycin NEGATIVE Sensitive     TETRACYCLINE Value in next row Sensitive      SENSITIVE<=1    * LIGHT GROWTH STAPHYLOCOCCUS AUREUS   Proteus penneri - MIC*    AMPICILLIN Value in next row Resistant      SENSITIVE<=1    CEFAZOLIN Value in next row Resistant      SENSITIVE<=1    CEFTRIAXONE Value in next row Sensitive      SENSITIVE<=1    CIPROFLOXACIN Value in next row Sensitive      SENSITIVE<=1    GENTAMICIN Value in next row Sensitive      SENSITIVE<=1    IMIPENEM Value in next row Sensitive      SENSITIVE<=1    NITROFURANTOIN Value in next row Resistant      SENSITIVE<=1    TRIMETH/SULFA Value in next row Sensitive      SENSITIVE<=1    PIP/TAZO Value in next row Sensitive      SENSITIVE<=4    * RARE GROWTH PROTEUS PENNERI   Enterococcus faecalis - MIC*    AMPICILLIN Value in next row Sensitive      SENSITIVE<=4    LINEZOLID Value in next row Sensitive      SENSITIVE<=4    * RARE GROWTH ENTEROCOCCUS FAECALIS  Wound culture     Status: None   Collection Time: 06/13/15  1:58 PM  Result Value Ref Range Status   Specimen Description LEG  Final   Special  Requests Normal  Final   Gram Stain RARE WBC SEEN FEW GRAM NEGATIVE RODS   Final   Culture NO GROWTH 4 DAYS  Final   Report Status 06/17/2015 FINAL  Final    Coagulation Studies: No results for input(s): LABPROT, INR in the last 72 hours.  Urinalysis: No results for input(s): COLORURINE, LABSPEC, PHURINE, GLUCOSEU, HGBUR, BILIRUBINUR, KETONESUR, PROTEINUR, UROBILINOGEN, NITRITE, LEUKOCYTESUR in the last 72 hours.  Invalid input(s): APPERANCEUR    Imaging: Dg Chest Port 1 View  06/20/2015   CLINICAL DATA:  Respiratory failure.  EXAM: PORTABLE CHEST - 1 VIEW  COMPARISON:  06/18/2015.  FINDINGS: Endotracheal tube, NG tube, left subclavian line in stable position. Cardiomegaly with progressive bilateral pulmonary alveolar infiltrates and pleural effusions noted. Findings consistent with progressive congestive heart failure. Bilateral pneumonia cannot be excluded. No pneumothorax.  IMPRESSION: 1. Lines and tubes in stable position. 2. Cardiomegaly with progressive bilateral pulmonary alveolar infiltrates and pleural effusions consistent congestive heart failure. Bilateral pneumonia cannot be excluded.   Electronically Signed   By: Marcello Moores  Register   On: 06/20/2015 07:19     Medications:   . dexmedetomidine 0.8 mcg/kg/hr (06/21/15 0950)  . feeding supplement (VITAL HIGH PROTEIN)    . pureflow 2,500 mL/hr at 06/21/15 0141   . albumin human  12.5 g Intravenous Q6H  . ampicillin-sulbactam (UNASYN) IV  3 g Intravenous Q8H  . antiseptic oral rinse  7 mL Mouth Rinse QID  . chlorhexidine gluconate  15 mL Mouth Rinse BID  . ciprofloxacin  400 mg Intravenous Q12H  . clonazePAM  0.25 mg Oral BID  . famotidine  20 mg Oral Q48H  . feeding supplement (PRO-STAT SUGAR FREE 64)  30 mL Oral TID  . heparin subcutaneous  5,000 Units Subcutaneous Q12H  . ipratropium-albuterol  3 mL Nebulization Q6H  . lactulose  20 g Oral BID  . metoprolol tartrate  25 mg Oral Q6H  . sodium phosphate  Dextrose 5% IVPB   30 mmol Intravenous Once   ammonium lactate, anticoagulant sodium citrate, fentaNYL (SUBLIMAZE) injection, ipratropium-albuterol, rocuronium, senna-docusate  Assessment/ Plan:  64 y.o. male with a PMHx of congestive heart failure ejection fraction 30-35%, prior pneumonia, lower extremity edema with cellulitis, atrial fibrillation who was admitted to [facility] on 06/10/2015 for evaluation of altered mental status and shortness of breath. Upon admission the patient underwent intubation with transition to a mechanical ventilation.  1. Acute renal failure/chronic kidney disease stage III with baseline creatinine 1.3/proteinuria. The patient most likely has acute renal failure secondary to altered cardiorenal hemodynamics. His ejection fraction is low at 30%. - remains off pressors -Oliguria persists, continue CRRT at this time, continue  UF target of 250cc/hr.   - 2900 cc negative negative last 24 hours  2. Acute respiratory failure. Remains on the vent, , continue volume removal to treat underlying heart failure/pulmonary edema.   3.anasarca from  Acute systolic heart failure. EF 30-35%. - d/c 1/2 ND-D5 as it is contributing to volume - use D10 for low blood sugars - UF 250 cc/hr (with iv albumin for oncotic support)  - CRRT probably another 24 hours and then we'll see   LOS: 11 Tymel Conely 8/25/201612:11 PM

## 2015-06-21 NOTE — Progress Notes (Signed)
Palliative Medicine Inpatient Consult Follow Up Note   Name: Aaron Harrison Date: 06/21/2015 MRN: [id number]  DOB: February 12, 1951  Referring Physician: Fritzi Mandes, MD  Palliative Care consult requested for this 64 y.o. male for goals of medical therapy in patient with respiratory failure and multi-organ dysfunction --now extubated.   TODAY'S CONVERSATIONS, EVENTS AND PLAN:  (see also this morning's note) 1)  He confirms that he wishes to be DNR 2)  He would be fine with living while being on dialysis. 3)  He does not want to live with a feeding tube (his mother had one and he does not want to go through that) 4)  He does not want to be put back on a ventilator if short of breath. 5)  He would like Aaron Harrison, his attorney friend, to be his 66 and he can complete these documents tomorrow.  6)  I have updated pts cousin, Aaron Harrison, who is now aware that pt can (as of right now at least) make his own healthcare decisions.     REVIEW OF SYSTEMS:  Patient is not able to provide ROS --He is able to tell me he has no pain --but he is anxious to talk about what I came to talk about and that is 'wishes'.   CODE STATUS: DNR  --this is confirmed to be patient's wishes (his nurse, Roselyn Reef, is present to witness this choice on part of pt as I talked with him)   PAST MEDICAL HISTORY: Past Medical History  Diagnosis Date  . Chronic atrial fibrillation     a. not on long term anticoagulation  . Pneumonia 2011  . Chronic systolic CHF (congestive heart failure)   . Cellulitis     PAST SURGICAL HISTORY: History reviewed. No pertinent past surgical history.  Vital Signs: BP 129/85 mmHg  Pulse 75  Temp(Src) 98.1 F (36.7 C) (Other (Comment))  Resp 27  Ht _0  (1.727 m)  Wt 112.4 kg (247 lb 12.8 oz)  BMI 37.69 kg/m2  SpO2 94% Filed Weights   06/19/15 0400 06/20/15 0326 06/21/15 0429  Weight: 119.1 kg (262 lb 9.1 oz) 116.9 kg (257 lb 11.5 oz) 112.4 kg (247 lb 12.8 oz)    Estimated body  mass index is 37.69 kg/(m^2) as calculated from the following:   Height as of this encounter: _1  (1.727 m).   Weight as of this encounter: 112.4 kg (247 lb 12.8 oz).  PHYSICAL EXAM: Off the vent Awake and alert and grossly oriented (he is off on the date a bit) He does repeat certain things and seems not to have retained some facts related to him (mild short term recall deficit noted in conversation --but overall, he is speaking logically) EOMI OP clear Neck w/o JVD or thyromegaly Heart irreg irreg no mgr Lungs cta anteriorly Abd obese and NT with nl BS Ext no cyanosis --legs are wrapped with gauze Skin no mottling  LABS: CBC:    Component Value Date/Time   WBC 8.3 06/21/2015 0507   WBC 19.5* 07/16/2013 0239   HGB 11.3* 06/21/2015 0507   HGB 15.2 07/16/2013 0239   HCT 34.6* 06/21/2015 0507   HCT 43.8 07/16/2013 0239   PLT 34* 06/21/2015 0507   PLT 179 07/16/2013 0239   MCV 102.3* 06/21/2015 0507   MCV 90 07/16/2013 0239   NEUTROABS 7.1* 06/21/2015 0507   LYMPHSABS 0.6* 06/21/2015 0507   MONOABS 0.5 06/21/2015 0507   EOSABS 0.1 06/21/2015 0507   BASOSABS 0.0 06/21/2015  0507   Comprehensive Metabolic Panel:    Component Value Date/Time   NA 139 06/21/2015 0810   NA 137 07/16/2013 0239   K 4.4 06/21/2015 0810   K 3.0* 07/16/2013 0239   CL 105 06/21/2015 0810   CL 103 07/16/2013 0239   CO2 28 06/21/2015 0810   CO2 25 07/16/2013 0239   BUN 27* 06/21/2015 0810   BUN 19* 07/16/2013 0239   CREATININE 1.26* 06/21/2015 0810   CREATININE 1.53* 07/16/2013 0239   GLUCOSE 147* 06/21/2015 0810   GLUCOSE 198* 07/16/2013 0239   CALCIUM 9.0 06/21/2015 0810   CALCIUM 8.8 07/16/2013 0239   AST 23 06/20/2015 0439   AST 22 07/16/2013 0239   ALT 19 06/20/2015 0439   ALT 23 07/16/2013 0239   ALKPHOS 53 06/20/2015 0439   ALKPHOS 91 07/16/2013 0239   BILITOT 3.3* 06/20/2015 0439   BILITOT 1.8* 07/16/2013 0239   PROT 6.5 06/20/2015 0439   PROT 7.3 07/16/2013 0239   ALBUMIN  3.5 06/21/2015 0810   ALBUMIN 4.1 07/16/2013 0239    IMPRESSION:  1) Acute Respiratory Failure due to pneumonia (RUL infiltrate on adm CXR) and pulmonary edema/ CHF ---intubated in the ED 06/10/15 ---extubated 06/12/15 but failed BIPAP and was re-intubated urgently on 06/13/15 ---NOW EXTUBATED AND TALKING 2)  Metabolic Encephalopathy ---he is awake and talking and (mostly) making sense ---he has some short term recall issues today in conversation ---he is, however, grossly oriented to self, others, date, location, and situation ---he is able to make his own decisions today  ---He is told he needs to assign a HCPOA (and Living Will can be done at same time).  2) Community Acquired Pneumonia ---Sputum Cx is growing MSSA (Bld and Ur Cxs neg) ---Treated with ABX as per ID consultant (Zosyn/ Vanco --then Unasyn  3) Afib with RVR w/ HR of about 150 at adm --and NSVT 11 beats last pm ---Followed by Cardiology (Ryan Dunn PA-C and Aris Everts MD)  ---On Amiodarone and anticoagulated with heparin drip 4) Acute on Chronic Systolic Congestive Heart Failure with EF of 30-35% ---with chronic lower leg edema worse recently 5) Suspected to have ischemic heart disease ---Mild elevations of troponin noted without diagnosis of MI (demand ischemia)  6) Acute Renal Failure on Chronic Kidney Disease stage 3 ---Likely due to ATN related to poor cardiac function  ---with baseline creatinine 1.3 and proteinuria.  ---On CRRT since 06/13/15 per Nephrologist ---Possible DC of CRRT on 06/22/15 7) Leg Cellulitis (related to venous stasis edema)--treated with Unasyn per ID 8) Anemia of chronic disease and kidney disease --BUT has macrocytosis with MCV of 103  (B12 ok this adm) 9) Metabolic Acidosis due to critical illness/ renal disease 10) Electrolyte Disarray --managed daily  11) Unclear history of 'liver disease'.  ---Ammonia level only 26 on 06/10/15 ---LFTS were quite elevated on 06/10/15 with  AST 301 and ALT 150 adn Total Bilirubin 6.7 with Alk Phos only 87. AST is now down to 108 and ALT is 112 and Total bili is 5.4 now.  ---Patient says he was on lactulose in 2011 when they told him his liver failed and he had a hospital stay for 'everything shutting down'.  Pt says he does not drink alcohol .  He doesn't know when he quit taking the lactulose but says he wasn't on it recently.  12) thrombocytopenia --likely related to liver disease  REFERRALS TO BE ORDERED:  None at this time  More than 50% of the visit was  spent in counseling/coordination of care: YES  Time Spent: 45 minutes

## 2015-06-21 NOTE — Progress Notes (Addendum)
Palliative Medicine Inpatient Consult Follow Up Note   Name: Aaron Harrison Date: 06/21/2015 MRN: [id number]  DOB: 1951/04/24  Referring Physician: Fritzi Mandes, MD  Palliative Care consult requested for this 64 y.o. male for goals of medical therapy in patient with multiple problems associated with his initial acute respiratory failure due to CAP and AFIB with RVR. He has been on CRRT and is not producing much urine. He has been on the vent for about 10 days (extubated and then reintubated the next day on the 17th). He has a cousin who is making decisions, but no HCPOA or closer family. Consideration is being given to withdrawal of aggressive care, given that those who know him have been stating that he would not want any of the following: Trache, feeding tube, or hemodialysis. However, if he can be extubated successfully (he has failed trials this week so far), then we can ask him his wishes.     TODAY'S CONVERSATIONS, EVENTS, AND PLANS: 1)  Pt is expected to be extubated today.  When that takes place, code status will be changed to full DNR. 2)  Also, once extubated, will revisit patient and discuss goals of care etc.  If he is able, he might be able to indicate a HCPOA on Friday (too early to ask him to do too much today). Will determine how much aggressive care he desires in conversation, but may have to see how much he can discuss and how much he needs to 'think about'.   3)  Family and friends indicate he was not caring for himself in his home (it was filthy) --if he recovers and does not want comfort terminal care, he will need some kind of rehab. I have learned that he is about 5 months behind paying his mortgage on his own home (but he still currently owns his home), so its a bit uncertain as to whether he would have any payor source such as Medicaid ---for a facility (non-rehab stay).   He has no health insurance apparently either.  4)  Family and friends also feel he would not want long  term dialysis, but will need to ask pt what he would see as acceptable.  Also, CRRT might end tomorrow apparently , per nephrology note. 5)  I will update his cousin, Aaron Harrison, on Friday.      REVIEW OF SYSTEMS:  Patient is not able to provide ROS  --INTUBATED THIS AM.  CODE STATUS: DNR  (DO NOT RE-INTUBATE)   PAST MEDICAL HISTORY: Past Medical History  Diagnosis Date  . Chronic atrial fibrillation     a. not on long term anticoagulation  . Pneumonia 2011  . Chronic systolic CHF (congestive heart failure)   . Cellulitis     PAST SURGICAL HISTORY: History reviewed. No pertinent past surgical history.  Vital Signs: BP 108/72 mmHg  Pulse 68  Temp(Src) 98.4 F (36.9 C) (Other (Comment))  Resp 30  Ht _0  (1.727 m)  Wt 112.4 kg (247 lb 12.8 oz)  BMI 37.69 kg/m2  SpO2 94% Filed Weights   06/19/15 0400 06/20/15 0326 06/21/15 0429  Weight: 119.1 kg (262 lb 9.1 oz) 116.9 kg (257 lb 11.5 oz) 112.4 kg (247 lb 12.8 oz)    Estimated body mass index is 37.69 kg/(m^2) as calculated from the following:   Height as of this encounter: _1  (1.727 m).   Weight as of this encounter: 112.4 kg (247 lb 12.8 oz).  PHYSICAL EXAM: NAD Vented Nods once  Hrt rrr no mgr Lungs with some rales  Abd soft and NT but large Skin w/o mottling Legs are wrapped w/ gauze distally  LABS: CBC:    Component Value Date/Time   WBC 8.3 06/21/2015 0507   WBC 19.5* 07/16/2013 0239   HGB 11.3* 06/21/2015 0507   HGB 15.2 07/16/2013 0239   HCT 34.6* 06/21/2015 0507   HCT 43.8 07/16/2013 0239   PLT 34* 06/21/2015 0507   PLT 179 07/16/2013 0239   MCV 102.3* 06/21/2015 0507   MCV 90 07/16/2013 0239   NEUTROABS 7.1* 06/21/2015 0507   LYMPHSABS 0.6* 06/21/2015 0507   MONOABS 0.5 06/21/2015 0507   EOSABS 0.1 06/21/2015 0507   BASOSABS 0.0 06/21/2015 0507   Comprehensive Metabolic Panel:    Component Value Date/Time   NA 139 06/21/2015 0810   NA 137 07/16/2013 0239   K 4.4 06/21/2015 0810    K 3.0* 07/16/2013 0239   CL 105 06/21/2015 0810   CL 103 07/16/2013 0239   CO2 28 06/21/2015 0810   CO2 25 07/16/2013 0239   BUN 27* 06/21/2015 0810   BUN 19* 07/16/2013 0239   CREATININE 1.26* 06/21/2015 0810   CREATININE 1.53* 07/16/2013 0239   GLUCOSE 147* 06/21/2015 0810   GLUCOSE 198* 07/16/2013 0239   CALCIUM 9.0 06/21/2015 0810   CALCIUM 8.8 07/16/2013 0239   AST 23 06/20/2015 0439   AST 22 07/16/2013 0239   ALT 19 06/20/2015 0439   ALT 23 07/16/2013 0239   ALKPHOS 53 06/20/2015 0439   ALKPHOS 91 07/16/2013 0239   BILITOT 3.3* 06/20/2015 0439   BILITOT 1.8* 07/16/2013 0239   PROT 6.5 06/20/2015 0439   PROT 7.3 07/16/2013 0239   ALBUMIN 3.5 06/21/2015 0810   ALBUMIN 4.1 07/16/2013 0239    IMPRESSION: 1) Acute Respiratory Failure due to pneumonia (RUL infiltrate on adm CXR) and pulmonary edema/ CHF ---intubated in the ED 06/10/15 ---extubated 06/12/15 but failed BIPAP and was re-intubated urgently on 06/13/15 ---Now again with spontaneous breathing trials underway to see if he can be extubated. ---Approaching time for trache if cannot be extubated 2) Community Acquired Pneumonia ---Sputum Cx is growing MSSA (Bld and Ur Cxs neg) ---Treated with ABX as per ID consultant (Zosyn/ Vanco --then Unasyn  3) Afib with RVR w/ HR of about 150 at adm - ---Followed by Cardiology (Ryan Dunn PA-C and Aris Everts MD)  ---On Amiodarone and anticoagulated with heparin drip 4) Acute on Chronic Systolic Congestive Heart Failure with EF of 30-35% ---with chronic lower leg edema worse recently 5) Suspected to have ischemic heart disease ---Mild elevations of troponin noted without diagnosis of MI (demand ischemia)  6) Acute Renal Failure on Chronic Kidney Disease stage 3 ---Likely due to ATN related to poor cardiac function  ---with baseline creatinine 1.3 and proteinuria.  ---On CRRT since 06/13/15 per Nephrologist, Dr. Holley Raring 7) Leg Cellulitis (related to venous stasis  edema)--treated with Unasyn per ID ---Cxs growing staph, proteus, and enterococcus 8) Anemia of chronic disease and kidney disease and Macrocytic Anemia -- MCV of 103 ([id number]) 9) Metabolic Acidosis due to critical illness/ renal disease 10) Electrolyte Disarray --managed daily  11) Unclear history of 'liver disease'.  --- At adm, his total bili was about 6 and it has come down gradually to around 1.9.  ---LFTS were quite elevated on 06/10/15 with AST 301 and ALT 150 adn Total Bilirubin 6.7 with Alk Phos only 87. AST is now down to 108 and ALT is 112 and Total  bili is 5.4 now. ---He was on lactulose at home, but no details of the etiology, work-up, or management of his supposed cirrhosis is known. Ammonia levels have not been elevated ---He is said to have ascites, but I do not see an imaging study confirming this  ---there is not any imaging study of his liver in the records I am able to review ---Now to get twice weekly LFTS and an ammonia level today 12) Possible gall bladder sludge 13) Thrombocytopenia --likely related to liver disease?  14) Macrocytic Anemia (B12/ folate pending --per MD Rounds discussion of pt's mentation). 25) Self -neglect at his home --per pt's attorney friend who visited today and reported that pt was not paying bills, was borrowing money often, and had let his home go to the point where it was incredibly 'nasty' per neighbors who went into home (with a key) to feed his two cats while he was here. 16) Leukocytosis 17) Constipation --Crit Care has ordered lactulose  PLAN: See top of note for PLAN   REFERRALS TO BE ORDERED:  None as yet   More than 50% of the visit was spent in counseling/coordination of care: YES  Time Spent:  50 min

## 2015-06-21 NOTE — Progress Notes (Signed)
Dr. Juanell Fairly assessed pt. Per instructions, wean trial started. Pt placed on spontaneous mode 5/12. Pt tolerating well. 02 sat 96-100%, vss. Will continue to monitor.

## 2015-06-22 ENCOUNTER — Inpatient Hospital Stay: Payer: Medicaid Other

## 2015-06-22 ENCOUNTER — Other Ambulatory Visit: Payer: Self-pay | Admitting: Internal Medicine

## 2015-06-22 DIAGNOSIS — G9341 Metabolic encephalopathy: Secondary | ICD-10-CM

## 2015-06-22 DIAGNOSIS — J15211 Pneumonia due to Methicillin susceptible Staphylococcus aureus: Secondary | ICD-10-CM

## 2015-06-22 DIAGNOSIS — N179 Acute kidney failure, unspecified: Secondary | ICD-10-CM | POA: Diagnosis not present

## 2015-06-22 DIAGNOSIS — R188 Other ascites: Secondary | ICD-10-CM | POA: Diagnosis not present

## 2015-06-22 DIAGNOSIS — I481 Persistent atrial fibrillation: Secondary | ICD-10-CM | POA: Diagnosis not present

## 2015-06-22 DIAGNOSIS — I5021 Acute systolic (congestive) heart failure: Secondary | ICD-10-CM | POA: Diagnosis not present

## 2015-06-22 LAB — RENAL FUNCTION PANEL
ALBUMIN: 3.6 g/dL (ref 3.5–5.0)
ANION GAP: 8 (ref 5–15)
Albumin: 3.8 g/dL (ref 3.5–5.0)
Albumin: 3.7 g/dL (ref 3.5–5.0)
Anion gap: 5 (ref 5–15)
Anion gap: 10 (ref 5–15)
BUN: 30 mg/dL — AB (ref 6–20)
BUN: 33 mg/dL — ABNORMAL HIGH (ref 6–20)
BUN: 43 mg/dL — AB (ref 6–20)
CALCIUM: 8.9 mg/dL (ref 8.9–10.3)
CHLORIDE: 106 mmol/L (ref 101–111)
CHLORIDE: 104 mmol/L (ref 101–111)
CO2: 30 mmol/L (ref 22–32)
CO2: 27 mmol/L (ref 22–32)
CO2: 27 mmol/L (ref 22–32)
Calcium: 8.9 mg/dL (ref 8.9–10.3)
Calcium: 8.7 mg/dL — ABNORMAL LOW (ref 8.9–10.3)
Chloride: 105 mmol/L (ref 101–111)
Creatinine, Ser: 1.39 mg/dL — ABNORMAL HIGH (ref 0.61–1.24)
Creatinine, Ser: 1.8 mg/dL — ABNORMAL HIGH (ref 0.61–1.24)
Creatinine, Ser: 2.5 mg/dL — ABNORMAL HIGH (ref 0.61–1.24)
GFR calc Af Amer: >60 mL/min (ref >60)
GFR calc Af Amer: 30 mL/min — ABNORMAL LOW (ref >60)
GFR calc non Af Amer: 39 mL/min — ABNORMAL LOW (ref >60)
GFR calc non Af Amer: 26 mL/min — ABNORMAL LOW (ref >60)
GFR, EST AFRICAN AMERICAN: 45 mL/min — AB (ref >60)
GFR, EST NON AFRICAN AMERICAN: 53 mL/min — AB (ref >60)
GLUCOSE: 95 mg/dL (ref 65–99)
Glucose, Bld: 94 mg/dL (ref 65–99)
Glucose, Bld: 87 mg/dL (ref 65–99)
PHOSPHORUS: 2.7 mg/dL (ref 2.5–4.6)
POTASSIUM: 4.3 mmol/L (ref 3.5–5.1)
POTASSIUM: 4.3 mmol/L (ref 3.5–5.1)
Phosphorus: 2.6 mg/dL (ref 2.5–4.6)
Phosphorus: 3.2 mg/dL (ref 2.5–4.6)
Potassium: 4.3 mmol/L (ref 3.5–5.1)
SODIUM: 140 mmol/L (ref 135–145)
Sodium: 141 mmol/L (ref 135–145)
Sodium: 141 mmol/L (ref 135–145)

## 2015-06-22 LAB — HEPATIC FUNCTION PANEL
ALBUMIN: 3.8 g/dL (ref 3.5–5.0)
ALK PHOS: 47 U/L (ref 38–126)
ALT: 28 U/L (ref 17–63)
AST: 40 U/L (ref 15–41)
Bilirubin, Direct: 1.2 mg/dL — ABNORMAL HIGH (ref 0.1–0.5)
Indirect Bilirubin: 1.5 mg/dL — ABNORMAL HIGH (ref 0.3–0.9)
TOTAL PROTEIN: 6.5 g/dL (ref 6.5–8.1)
Total Bilirubin: 2.7 mg/dL — ABNORMAL HIGH (ref 0.3–1.2)

## 2015-06-22 LAB — GLUCOSE, CAPILLARY
GLUCOSE-CAPILLARY: 72 mg/dL (ref 65–99)
GLUCOSE-CAPILLARY: 90 mg/dL (ref 65–99)
GLUCOSE-CAPILLARY: 93 mg/dL (ref 65–99)
Glucose-Capillary: 73 mg/dL (ref 65–99)
Glucose-Capillary: 70 mg/dL (ref 65–99)
Glucose-Capillary: 84 mg/dL (ref 65–99)

## 2015-06-22 LAB — MAGNESIUM
MAGNESIUM: 1.8 mg/dL (ref 1.7–2.4)
MAGNESIUM: 1.9 mg/dL (ref 1.7–2.4)
Magnesium: 1.8 mg/dL (ref 1.7–2.4)

## 2015-06-22 LAB — TYPE AND SCREEN
ABO/RH(D): O NEG
ANTIBODY SCREEN: NEGATIVE

## 2015-06-22 LAB — AMMONIA: Ammonia: 53 umol/L — ABNORMAL HIGH (ref 9–35)

## 2015-06-22 MED ORDER — CIPROFLOXACIN HCL 500 MG PO TABS: 500.0000 mg | ORAL_TABLET | Freq: Every day | ORAL | Status: DC

## 2015-06-22 MED ORDER — APIXABAN 5 MG PO TABS
5.0000 mg | ORAL_TABLET | Freq: Two times a day (BID) | ORAL | Status: DC
  Administered 2015-06-23: 5 mg via ORAL
  Filled 2015-06-22 (×2): qty 1

## 2015-06-22 MED ORDER — HEPARIN SODIUM (PORCINE) 1000 UNIT/ML IJ SOLN
1000.0000 [IU] | Freq: Once | INTRAMUSCULAR | Status: AC
  Administered 2015-06-22: 1000 [IU] via INTRAVENOUS
  Filled 2015-06-22: qty 1

## 2015-06-22 MED ORDER — AMOXICILLIN-POT CLAVULANATE 500-125 MG PO TABS
1.0000 | ORAL_TABLET | Freq: Every day | ORAL | Status: DC
  Filled 2015-06-22: qty 1

## 2015-06-22 MED ORDER — METOPROLOL TARTRATE 1 MG/ML IV SOLN
2.5000 mg | Freq: Four times a day (QID) | INTRAVENOUS | Status: DC
  Administered 2015-06-23 – 2015-06-24 (×6): 2.5 mg via INTRAVENOUS
  Filled 2015-06-22 (×6): qty 5

## 2015-06-22 MED ORDER — BOOST / RESOURCE BREEZE PO LIQD
1.0000 | Freq: Three times a day (TID) | ORAL | Status: DC
  Administered 2015-06-23 – 2015-07-12 (×48): 1 via ORAL

## 2015-06-22 MED ORDER — ONDANSETRON HCL 4 MG/2ML IJ SOLN
4.0000 mg | Freq: Four times a day (QID) | INTRAMUSCULAR | Status: DC | PRN
  Administered 2015-06-22 – 2015-06-23 (×2): 4 mg via INTRAVENOUS
  Filled 2015-06-22 (×2): qty 2

## 2015-06-22 MED ORDER — SODIUM CHLORIDE 0.9 % IV SOLN
3.0000 g | Freq: Four times a day (QID) | INTRAVENOUS | Status: DC
  Administered 2015-06-22 – 2015-06-23 (×4): 3 g via INTRAVENOUS
  Filled 2015-06-22 (×8): qty 3

## 2015-06-22 MED ORDER — LORAZEPAM 2 MG/ML IJ SOLN
0.5000 mg | Freq: Four times a day (QID) | INTRAMUSCULAR | Status: DC | PRN
  Administered 2015-06-22: 0.5 mg via INTRAVENOUS
  Filled 2015-06-22 (×2): qty 1

## 2015-06-22 MED ORDER — FUROSEMIDE 10 MG/ML IJ SOLN
80.0000 mg | Freq: Once | INTRAMUSCULAR | Status: AC
  Administered 2015-06-22: 80 mg via INTRAVENOUS
  Filled 2015-06-22: qty 8

## 2015-06-22 MED ORDER — CIPROFLOXACIN IN D5W 400 MG/200ML IV SOLN
400.0000 mg | INTRAVENOUS | Status: DC
  Filled 2015-06-22 (×2): qty 200

## 2015-06-22 MED ORDER — FAMOTIDINE IN NACL 20-0.9 MG/50ML-% IV SOLN
20.0000 mg | INTRAVENOUS | Status: DC
  Administered 2015-06-22 – 2015-06-23 (×2): 20 mg via INTRAVENOUS
  Filled 2015-06-22 (×2): qty 50

## 2015-06-22 MED ORDER — SODIUM CHLORIDE 0.9 % IV SOLN
3.0000 g | Freq: Every day | INTRAVENOUS | Status: DC
  Filled 2015-06-22 (×2): qty 3

## 2015-06-22 NOTE — Progress Notes (Signed)
[facility] NOTE  Pharmacy Consult for Electrolytes/CRRT dose adjustment    Allergies  Allergen Reactions  . Penicillins Nausea And Vomiting    Patient Measurements: Height: 5\' 8"  (172.7 cm) Weight: 248 lb 0.3 oz (112.5 kg) IBW/kg (Calculated) : 68.4   Vital Signs: Temp: 99.5 F (37.5 C) (08/26 0900) Temp Source: Oral (08/26 0730) BP: 147/82 mmHg (08/26 0900) Pulse Rate: 101 (08/26 0900) Intake/Output from previous day: 08/25 0701 - 08/26 0700 In: 1920.5 [P.O.:240; I.V.:140.5; NG/GT:510; IV Piggyback:1000] Out: 3152 [Urine:123; Stool:50] Intake/Output from this shift: Total I/O In: -  Out: 100 [Urine:100] Vent settings for last 24 hours:    Labs:  Recent Labs  06/20/15 0439  06/21/15 0507 06/21/15 0810 06/21/15 1736 06/21/15 1755 06/22/15 0125 06/22/15 0820  WBC 8.4  --  8.3  --   --   --   --   --   HGB 12.3*  --  11.3*  --   --   --   --   --   HCT 37.0*  --  34.6*  --   --   --   --   --   PLT 40*  --  34*  --   --   --   --   --   CREATININE 1.11  < >  --  1.26*  --  1.30* 1.39* 1.80*  MG  --   < >  --  1.8 1.8  --  1.8  --   PHOS  --   < >  --  2.0*  --  3.2 2.6 2.7  ALBUMIN 3.5  < >  --  3.5  --  3.7 3.8 3.7  PROT 6.5  --   --   --   --   --   --   --   AST 23  --   --   --   --   --   --   --   ALT 19  --   --   --   --   --   --   --   ALKPHOS 53  --   --   --   --   --   --   --   BILITOT 3.3*  --   --   --   --   --   --   --   < > = values in this interval not displayed. Estimated Creatinine Clearance: 51.8 mL/min (by C-G formula based on Cr of 1.8).   Recent Labs  06/21/15 2359 06/22/15 0403 06/22/15 0854  GLUCAP 72 73 70   BMP Latest Ref Rng 06/22/2015 06/22/2015 06/21/2015  Glucose 65 - 99 mg/dL 87 94 101(H)  BUN 6 - 20 mg/dL 33(H) 30(H) 30(H)  Creatinine 0.61 - 1.24 mg/dL 1.80(H) 1.39(H) 1.30(H)  Sodium 135 - 145 mmol/L 140 141 139  Potassium 3.5 - 5.1 mmol/L 4.3 4.3 4.6  Chloride 101 - 111 mmol/L 105 106 105   CO2 22 - 32 mmol/L 27 30 28   Calcium 8.9 - 10.3 mg/dL 8.9 8.9 8.9     Microbiology: Recent Results (from the past 720 hour(s))  Blood Culture (routine x 2)     Status: None   Collection Time: 06/10/15  2:35 PM  Result Value Ref Range Status   Specimen Description BLOOD RIGHT WRIST  Final   Special Requests BOTTLES DRAWN AEROBIC AND ANAEROBIC  1CC  Final   Culture NO GROWTH 5 DAYS  Final   Report Status 06/15/2015 FINAL  Final  Blood Culture (routine x 2)     Status: None   Collection Time: 06/10/15  2:40 PM  Result Value Ref Range Status   Specimen Description BLOOD LEFT ASSIST CONTROL  Final   Special Requests   Final    BOTTLES DRAWN AEROBIC AND ANAEROBIC  AER 6CC ANA 4CC   Culture NO GROWTH 5 DAYS  Final   Report Status 06/15/2015 FINAL  Final  Urine culture     Status: None   Collection Time: 06/10/15  4:42 PM  Result Value Ref Range Status   Specimen Description URINE, RANDOM  Final   Special Requests NONE  Final   Culture 4,000 COLONIES/mL INSIGNIFICANT GROWTH  Final   Report Status 06/12/2015 FINAL  Final  MRSA PCR Screening     Status: None   Collection Time: 06/10/15  6:27 PM  Result Value Ref Range Status   MRSA by PCR NEGATIVE NEGATIVE Final    Comment:        The GeneXpert MRSA Assay (FDA approved for NASAL specimens only), is one component of a comprehensive MRSA colonization surveillance program. It is not intended to diagnose MRSA infection nor to guide or monitor treatment for MRSA infections.   Culture, sputum-assessment     Status: None   Collection Time: 06/11/15  2:50 PM  Result Value Ref Range Status   Specimen Description SPUTUM  Final   Special Requests NONE  Final   Sputum evaluation THIS SPECIMEN IS ACCEPTABLE FOR SPUTUM CULTURE  Final   Report Status 06/13/2015 FINAL  Final  Culture, respiratory (NON-Expectorated)     Status: None   Collection Time: 06/11/15  2:50 PM  Result Value Ref Range Status   Specimen Description SPUTUM  Final    Special Requests NONE Reflexed from [id number]  Final   Gram Stain   Final    MODERATE WBC SEEN RARE GRAM POSITIVE COCCI GOOD SPECIMEN - 80-90% WBCS    Culture MODERATE GROWTH STAPHYLOCOCCUS AUREUS  Final   Report Status 06/14/2015 FINAL  Final   Organism ID, Bacteria STAPHYLOCOCCUS AUREUS  Final      Susceptibility   Staphylococcus aureus - MIC*    CIPROFLOXACIN <=0.5 SENSITIVE Sensitive     GENTAMICIN <=0.5 SENSITIVE Sensitive     OXACILLIN 0.5 SENSITIVE Sensitive     TRIMETH/SULFA <=10 SENSITIVE Sensitive     CEFOXITIN SCREEN NEGATIVE Sensitive     Inducible Clindamycin NEGATIVE Sensitive     TETRACYCLINE Value in next row Sensitive      SENSITIVE<=1    * MODERATE GROWTH STAPHYLOCOCCUS AUREUS  Wound culture     Status: None   Collection Time: 06/13/15  1:56 PM  Result Value Ref Range Status   Specimen Description LEG  Final   Special Requests Normal  Final   Gram Stain   Final    FEW WBC SEEN MODERATE GRAM NEGATIVE RODS FEW GRAM POSITIVE COCCI    Culture   Final    LIGHT GROWTH STAPHYLOCOCCUS AUREUS RARE GROWTH PROTEUS PENNERI RARE GROWTH ENTEROCOCCUS FAECALIS    Report Status 06/17/2015 FINAL  Final   Organism ID, Bacteria STAPHYLOCOCCUS AUREUS  Final   Organism ID, Bacteria PROTEUS PENNERI  Final   Organism ID, Bacteria ENTEROCOCCUS FAECALIS  Final      Susceptibility   Staphylococcus aureus - MIC*    CIPROFLOXACIN <=0.5 SENSITIVE Sensitive     GENTAMICIN <=0.5 SENSITIVE Sensitive     OXACILLIN 0.5 SENSITIVE  Sensitive     TRIMETH/SULFA <=10 SENSITIVE Sensitive     CEFOXITIN SCREEN NEGATIVE Sensitive     Inducible Clindamycin NEGATIVE Sensitive     TETRACYCLINE Value in next row Sensitive      SENSITIVE<=1    * LIGHT GROWTH STAPHYLOCOCCUS AUREUS   Proteus penneri - MIC*    AMPICILLIN Value in next row Resistant      SENSITIVE<=1    CEFAZOLIN Value in next row Resistant      SENSITIVE<=1    CEFTRIAXONE Value in next row Sensitive      SENSITIVE<=1     CIPROFLOXACIN Value in next row Sensitive      SENSITIVE<=1    GENTAMICIN Value in next row Sensitive      SENSITIVE<=1    IMIPENEM Value in next row Sensitive      SENSITIVE<=1    NITROFURANTOIN Value in next row Resistant      SENSITIVE<=1    TRIMETH/SULFA Value in next row Sensitive      SENSITIVE<=1    PIP/TAZO Value in next row Sensitive      SENSITIVE<=4    * RARE GROWTH PROTEUS PENNERI   Enterococcus faecalis - MIC*    AMPICILLIN Value in next row Sensitive      SENSITIVE<=4    LINEZOLID Value in next row Sensitive      SENSITIVE<=4    * RARE GROWTH ENTEROCOCCUS FAECALIS  Wound culture     Status: None   Collection Time: 06/13/15  1:58 PM  Result Value Ref Range Status   Specimen Description LEG  Final   Special Requests Normal  Final   Gram Stain RARE WBC SEEN FEW GRAM NEGATIVE RODS   Final   Culture NO GROWTH 4 DAYS  Final   Report Status 06/17/2015 FINAL  Final    Medications:  Scheduled:  . sodium chloride   Intravenous Once  . ampicillin-sulbactam (UNASYN) IV  3 g Intravenous Daily  . antiseptic oral rinse  7 mL Mouth Rinse QID  . chlorhexidine gluconate  15 mL Mouth Rinse BID  . ciprofloxacin  400 mg Intravenous Q24H  . clonazePAM  0.25 mg Oral BID  . famotidine  20 mg Oral Q48H  . feeding supplement (PRO-STAT SUGAR FREE 64)  30 mL Oral TID  . furosemide  80 mg Intravenous Once  . ipratropium-albuterol  3 mL Nebulization Q6H  . lactulose  20 g Oral BID  . metoprolol tartrate  25 mg Oral Q6H    Assessment: 64 y/o M with acute respiratory failure now extubated. Patient off CRRT and may begin HD tomorrow per nephrology.   Plan:  As patient is off CRRT, will change ciprofloxacin to 400 mg iv q 24 hours and Unasyn to 3 g iv q 24 hours. Electrolytes are WNL. Will f/u AM labs.  Ulice Dash, PharmD 06/22/2015,9:26 AM

## 2015-06-22 NOTE — Progress Notes (Signed)
Approx 0325 pt crrt stopped due to venous port occluding. S/w dr Candiss Norse, give verbal order to pack with heparin. (see Mar). Dr. Candiss Norse plans to stop CRRT at this time and possibly start HD tomorrow.

## 2015-06-22 NOTE — Progress Notes (Signed)
Nutrition Follow-up   INTERVENTION:   Coordination of Care: discussed poc with MD Megan Salon, plan to downgrade diet to CL with plans to advance as tolerated to solid diet.  Medical Food Supplement Therapy: recommend addition of Ensure Clear while on CL diet  NUTRITION DIAGNOSIS:   Inadequate oral intake related to acute illness as evidenced by NPO status. Being addressed as diet advanced post extubation   GOAL:   Patient will meet greater than or equal to 90% of their needs  MONITOR:    (Energy Intake, Anthropometrics, Digestive System, Electrolyte/Renal Profile)  ASSESSMENT:    Pt s/p extubation yesterday, off CRRT, plan for dialysis as needed; pt alert this AM, noted pt sundowning late afternoon into evening  Diet Order: Renal Diet  Energy Intake: pt tolerated some [place], salad and fruit per Roselyn Reef RN mid afternoon yesterday, recorded po intake 25% at dinner last night. Per Roselyn Reef RN, pt nauseated and vomited large amount of bilious output this AM. Per documentation, pt became nauseated when rolled in bed  Digestive System: +large amount of unmeasured stool yesterday around flexiseal; +N/V this AM, no change in abdomen as abdomen remains distended  Electrolyte/Renal profile  Recent Labs Lab 06/21/15 1736 06/21/15 1755 06/22/15 0125 06/22/15 0820  NA  --  139 141 140  K  --  4.6 4.3 4.3  CL  --  105 106 105  CO2  --  28 30 27   BUN  --  30* 30* 33*  CREATININE  --  1.30* 1.39* 1.80*  CALCIUM  --  8.9 8.9 8.9  MG 1.8  --  1.8 1.9  PHOS  --  3.2 2.6 2.7  GLUCOSE  --  101* 94 87   Glucose Profile:   Recent Labs  06/22/15 0403 06/22/15 0854 06/22/15 1143  GLUCAP 73 70 84   Protein Profile:   Recent Labs Lab 06/21/15 1755 06/22/15 0125 06/22/15 0820  ALBUMIN 3.7 3.8 3.8  3.7   Meds: lasix, lactulose, ativan  Skin:  Reviewed, no issues  Height:   Ht Readings from Last 1 Encounters:  06/13/15 5\' 8"  (1.727 m)    Weight:   Wt Readings from Last 1  Encounters:  06/22/15 248 lb 0.3 oz (112.5 kg)   BMI:  Body mass index is 37.72 kg/(m^2).  Estimated Nutritional Needs:   Kcal:  1750-2100   Protein:  84-105 g (1.2-1.5 g/kg)   Fluid:  1000 mL plus UOP  HIGH Care Level  Kerman Passey MS, RD, LDN [phone] Pager

## 2015-06-22 NOTE — Care Management Note (Signed)
Case Management Note  Patient Details  Name: Aaron Harrison MRN: [id number] Date of Birth: Oct 27, 1951  Subjective/Objective:   Extubated 06/21/2015. Currently on 2L/ [place] and tolerating well. Continues CRT. To have Advanced Directives completed today. [facility] updated. Following                 Action/Plan:   Expected Discharge Date:                  Expected Discharge Plan:     In-House Referral:     Discharge planning Services     Post Acute Care Choice:    Choice offered to:     DME Arranged:    DME Agency:     HH Arranged:    [place] Agency:     Status of Service:     Medicare Important Message Given:    Date Medicare IM Given:    Medicare IM give by:    Date Additional Medicare IM Given:    Additional Medicare Important Message give by:     If discussed at [place] of Stay Meetings, dates discussed:    Additional Comments:  Jolly Mango, RN 06/22/2015, 10:17 AM

## 2015-06-22 NOTE — Progress Notes (Signed)
Palliative Care Update   Pt sundowns.  No elective questions need to be posed to him after about 3pm.  He goes by Aaron Harrison' not [name] or [name] (FYI)  Also, his attorney friend, Aaron Harrison, requested that Chaplain do the Advance Diurectives b/c Aaron Harrison is to be named as the HCPOA.  Pt probably has a mild dementia.  This does not mean he cannot make his health care decisions when he is not sundowning.     Aaron Can, MD

## 2015-06-22 NOTE — Progress Notes (Signed)
64 y/o M with ARF requiring CRRT/HD on ciprofloxacin iv and Unasyn IV for LE cellulitis and MSSA PNA.  CBC    Component Value Date/Time   WBC 8.3 06/21/2015 0507   WBC 19.5* 07/16/2013 0239   RBC 3.39* 06/21/2015 0507   RBC 4.84 07/16/2013 0239   HGB 11.3* 06/21/2015 0507   HGB 15.2 07/16/2013 0239   HCT 34.6* 06/21/2015 0507   HCT 43.8 07/16/2013 0239   PLT 34* 06/21/2015 0507   PLT 179 07/16/2013 0239   MCV 102.3* 06/21/2015 0507   MCV 90 07/16/2013 0239   MCH 33.4 06/21/2015 0507   MCH 31.4 07/16/2013 0239   MCHC 32.7 06/21/2015 0507   MCHC 34.7 07/16/2013 0239   RDW 17.1* 06/21/2015 0507   RDW 13.6 07/16/2013 0239   LYMPHSABS 0.6* 06/21/2015 0507   MONOABS 0.5 06/21/2015 0507   EOSABS 0.1 06/21/2015 0507   BASOSABS 0.0 06/21/2015 0507    Patient is tolerating oral medications and diet so will convert to oral antibiotics with ciprofloxacin 500 mg daily and Augmentin 500 mg daily.

## 2015-06-22 NOTE — Progress Notes (Signed)
[place] [facility] Progess Note     ASSESSMENAT/PLAN   64 yo white male admitted to ICU for acute  hypercapnic and hypoxic resp failure from acute CHF exacerbation with acute liver dysfunction/liver failure with acute renal failure with acute encephalopathy with mixed resp and metabolic acidosis with recurrent resp failure. Pt grew MSSA in sputum, suspect pneumonia.  Pt failed extubation and reintubated, extubated on 8/25. Palliative care following to address goals of care and expectations.   PULMONARY 1.Respiratory Failure-patient reintubated  due to worsening cardio-renal syndrome - extubated on 06/21/15 afternoon - Limit sedation -MSSA in sputum. Possible pneumonia, on antibiotics.  Enterococcus on resp cx, now on unasyn -continue Bronchodilator Therapy - cont with even I/O status -check abg prn  CARDIOVASCULAR ACUTE CHF, chronic atrial fibrillation with RVR, now controlled.  -History of systolic congestive heart failure with an ejection fraction of 35% -history of afib - started on IV amiodarone due to BP issues, now with stable Bp and transitioned to metoprolol - continue to have massive edema - receiving intermittent HD also - Afbit, controlled on Beta blocker  RENAL Renal Failure-most likely due to ATN, currently on CRRT - catheter clotted overnight, now with elevated Cr, nephro following -follow chem 7 -follow UO -continue Foley Catheter-assess need -vasc cath placed for CRRT-nephrology following - continue to have generalized edema  -placed on intermittent albumin by nephro   GASTROINTESTINAL GI Prophylaxic -OG placed-on TF's - U/S of RUL with GB sludge but no fluid noted, no leukocytosis - hx of ? 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   INFECTIOUS ?cellulitis of LE-wound culture positive for multiple organisms including E faecalis MSSA, and Proteus. -on unasyn -Sputum culture positive for MSSA - continue unasyn for MSSA pneumonia, ID following.   ENDOCRINE - ICU hypoglycemic\Hyperglycemia protocol   NEUROLOGIC - extubated on 8/25, awake and alert - limit - benzos and opiods     -Patient was discussed with the critical care RN.  - palliative care following - discussing goals of care, expectations, future code status>>currently not be reintubated 64 yo white male admitted (see palliative care note 06/21/15) -DVT prophylaxis, lower extremity venous avoided due to large ulcers, pharmacologic DVT prophylaxis on hold due to worsening thrombocytopenia.  May try a direct thrombin inhibitor for DVT prophylaxis.  Fabienne Bruns Prophylaxis ----------------------------------------   Name: Aaron Harrison MRN: [id number] DOB: 30-Jun-1951    ADMISSION DATE:  06/10/2015  SUBJECTIVE:   Patient awake and alert this morning, friend (fellow EMT) in room, had some nausea.  Has a mild jaundice appearance. Had some nausea this morning. His dialysis access clotted over night.      VITAL SIGNS: Temp:  [98.1 F (36.7 C)-99.3 F (37.4 C)] 99.3 F (37.4 C) (08/26 0800) Pulse Rate:  [54-103] 101 (08/26 0828) Resp:  [21-35] 23 (08/26 0700) BP: (108-161)/(68-97) 125/68 mmHg (08/26 0828) SpO2:  [90 %-100 %] 99 % (08/26 0825) Weight:  [248 lb 0.3 oz (112.5 kg)] 248 lb 0.3 oz (112.5 kg) (08/26 0500) HEMODYNAMICS:   VENTILATOR SETTINGS:   INTAKE / OUTPUT:  Intake/Output Summary (Last 24 hours) at 06/22/15 0913 Last data filed at 06/22/15 0500  Gross per 24 hour  Intake 1380.93 ml  Output   2667 ml  Net -1286.07 ml    PHYSICAL EXAMINATION: Physical Examination:   VS: BP  125/68 mmHg  Pulse 101  Temp(Src) 99.3 F (37.4 C) (Other (Comment))  Resp 23  Ht 5\' 8"  (1.727 m)  Wt 248 lb 0.3 oz (112.5 kg)  BMI 37.72 kg/m2  SpO2  99%  General Appearance: No distress, awake, mild jaundice appearance Neuro:without focal findings, mental status normal. HEENT: PERRLA, EOM intact. Pulmonary: coarse upper airway sounds, dec bs at the bases, with some fine crackles.  CardiovascularNormal S1,S2.  No m/r/g.   Abdomen: Benign, Soft, non-tender. GU:  Not performed at this time. Endocrine: No evident thyromegaly. Skin:   Warm , anterior LE B/L skin wounds (clean based, no purulent drainage).  Extremities: normal, no cyanosis, clubbing.   LABS:  CBC  Recent Labs Lab 06/18/15 0408 06/20/15 0439 06/21/15 0507  WBC 5.1 8.4 8.3  HGB 10.9* 12.3* 11.3*  HCT 33.4* 37.0* 34.6*  PLT 34* 40* 34*   Coag's No results for input(s): APTT, INR in the last 168 hours. BMET  Recent Labs Lab 06/21/15 1755 06/22/15 0125 06/22/15 0820  NA 139 141 140  K 4.6 4.3 4.3  CL 105 106 105  CO2 28 30 27   BUN 30* 30* 33*  CREATININE 1.30* 1.39* 1.80*  GLUCOSE 101* 94 87   Electrolytes  Recent Labs Lab 06/21/15 0810 06/21/15 1736 06/21/15 1755 06/22/15 0125 06/22/15 0820  CALCIUM 9.0  --  8.9 8.9 8.9  MG 1.8 1.8  --  1.8  --   PHOS 2.0*  --  3.2 2.6 2.7   Sepsis Markers No results for input(s): LATICACIDVEN, PROCALCITON, O2SATVEN in the last 168 hours. ABG  Recent Labs Lab 06/18/15 0500 06/20/15 0424 06/21/15 1050  PHART 7.41 7.44 7.41  PCO2ART 44 38 43  PO2ART 76* 85 85   Liver Enzymes  Recent Labs Lab 06/18/15 0814  06/20/15 0439  06/21/15 1755 06/22/15 0125 06/22/15 0820  AST 30  --  23  --   --   --   --   ALT 23  --  19  --   --   --   --   ALKPHOS  --   --  53  --   --   --   --   BILITOT  --   --  3.3*  --   --   --   --   ALBUMIN 2.3*  < > 3.5  < > 3.7 3.8 3.7  < > = values in this interval not displayed. Cardiac Enzymes No results for input(s): TROPONINI, PROBNP in the last 168 hours. Glucose  Recent Labs Lab 06/21/15 1143 06/21/15 1654 06/21/15 2003 06/21/15 2359 06/22/15 0403  06/22/15 0854  GLUCAP 150* 82 78 72 73 70     No results found.   I have personally obtained a history, examined the patient, evaluated laboratory and imaging results, formulated the assessment and plan and placed orders. CRITICAL CARE: The patient is critically ill with multiple organ systems failure and requires high complexity decision making for assessment and support, frequent evaluation and titration of therapies, application of advanced monitoring technologies and extensive interpretation of multiple databases. Critical Care Time devoted to patient care services described in this note is 30 minutes.    Vilinda Boehringer, MD [facility] Pager [phone] (please enter 7-digits) On Call Pager - [phone] (please enter 7-digits)

## 2015-06-22 NOTE — Progress Notes (Signed)
[place] [facility] [place] NAME: Aaron Harrison    MR#:  [id number]  DATE OF BIRTH:  09/14/1951  SUBJECTIVE:  Finally extubated.  voimting x1 this am while repositioning. No complaints. Dialysis held due to HD Cath clotting.  oliguria    Review of Systems  Constitutional: Negative for fever, chills and weight loss.  HENT: Negative for ear discharge, ear pain and nosebleeds.   Eyes: Negative for blurred vision, pain and discharge.  Respiratory: Negative for sputum production, shortness of breath, wheezing and stridor.   Cardiovascular: Negative for chest pain, palpitations, orthopnea and PND.  Gastrointestinal: Positive for vomiting. Negative for nausea, abdominal pain and diarrhea.  Genitourinary: Negative for urgency and frequency.  Musculoskeletal: Negative for back pain and joint pain.  Neurological: Positive for weakness. Negative for sensory change, speech change and focal weakness.  Psychiatric/Behavioral: Negative for depression and hallucinations. The patient is not nervous/anxious.    DRUG ALLERGIES:   Allergies  Allergen Reactions  . Penicillins Nausea And Vomiting    VITALS:  Blood pressure 147/82, pulse 101, temperature 99.5 F (37.5 C), temperature source Other (Comment), resp. rate 33, height 5\' 8"  (1.727 m), weight 112.5 kg (248 lb 0.3 oz), SpO2 99 %.  PHYSICAL EXAMINATION:   Physical Exam  GENERAL:  64 y.o.-year-old patient lying in the bed with no acute distress. critically ill anasarca EYES: Pupils equal, round, reactive to light and accommodation. No scleral icterus. Extraocular muscles intact.  HEENT: Head atraumatic, normocephalic. Oropharynx and nasopharynx clear.  NECK:  Supple, no jugular venous distention. No thyroid enlargement, no tenderness.  LUNGS: Normal breath sounds bilaterally, no wheezing, rales, rhonchi. No use of accessory muscles of respiration. Left upper chest CL+ CARDIOVASCULAR: S1, S2  normal. No murmurs, rubs, or gallops.  ABDOMEN: Soft, nontender, nondistended. Bowel sounds present. No organomegaly or mass.  -severe scrotal edema EXTREMITIES: bilateral +++ edema b/l with weeping skin ulcers on tibial shin NEUROLOGIC: sedated and on the vent PSYCHIATRIC: on the vent SKIN:bilateral LE cellulitis and weeping skin ulcers over the tibial shin  LABORATORY PANEL:   CBC  Recent Labs Lab 06/21/15 0507  WBC 8.3  HGB 11.3*  HCT 34.6*  PLT 34*    Chemistries   Recent Labs Lab 06/22/15 0820  NA 140  K 4.3  CL 105  CO2 27  GLUCOSE 87  BUN 33*  CREATININE 1.80*  CALCIUM 8.9  MG 1.9  AST 40  ALT 28  ALKPHOS 47  BILITOT 2.7*    Cardiac Enzymes No results for input(s): TROPONINI in the last 168 hours.  RADIOLOGY:  No results found. ASSESSMENT AND PLAN:  64 y.o. male with a known history of A. fib, CHF came in with  * Acute respiratory failure with hypoxia: intubated in ED, extubated on August 15 - Reintubated on August 17 due to increased work of breathing. -extubated 06/20/15 and doing well  * Sepsis with multiorgan failure: Likely hospital-acquired pneumonia -BC negative -wbc stable -no fever  * MSSA Pneumonia- on  IV Unasyn  * Acute renal failure: Possible cardiorenal syndrome due to underlying CHF. Appreciate nephrology input - temporary dialysis catheter placed in the right groin and CRRT started 8/17 -Dialysis cath clotted. Remains oliguric. Lasix trial today  * Acute on chronic systolic CHF  Echo showing ejection fraction 30% to 35% -CRRT with  UF on hold due to catheter issues -pt has anasarca  * A. fib with RVR:  Not able to use rate controlling  medication due to hypotension. - On po  Amiodarone -Continue aspirin for now  * Elevated troponin: Due to supply demand ischemia  * Bilateral lower extremity cellulitis: change to IV cipro given proteus in the wound Seen by wound care RN.   * Hyperkalemia: Resolved  *  Ascites/Abnormal liver function test: GI following, on lactulose  Case discussed with Care Management/Social Worker. CODE STATUS: DNR DVT Prophylaxis: sq heparin  TOTAL Critical TIME TAKING CARE OF THIS PATIENT: 84minutes.  >50% time spent on counselling and coordination of care with RN, Dr Carlene Coria M.D on 06/22/2015 at 12:05 PM  Between 7am to 6pm - Pager - [phone]  After 6pm go to [email or web address] - password [email or web address] [facility]  [facility]  Office  [phone]  CC: Primary care physician; No PCP Per Patient

## 2015-06-22 NOTE — Progress Notes (Signed)
   06/22/15 1456  Clinical Encounter Type  Visited With Patient;Health care provider  Visit Type Spiritual support  Referral From Palliative care team  Consult/Referral To Chaplain  Spiritual Encounters  Spiritual Needs Literature;Brochure;Other (Comment)  Stress Factors  Patient Stress Factors None identified  Chaplain was notified to come to the unit as patient wanted to complete an AD. Spoke with Physician and we visited with patient and was able to write down his wishes and desires pertaining to his health care. chaplain was able to locate two witnesses and contact notary Ms. [name]. Completed AD was given to patient and copy made and placed in patient's chart and notified Dr. Megan Salon that it was complete. Chaplain Vernella Niznik A. Soloman Mckeithan Ext. [place] Emilyrose Darrah

## 2015-06-22 NOTE — Progress Notes (Signed)
Subjective:  Off CRRT. Catheter clotted. Packed with tPA. Clotted again.  Remains oliguric. uop 115 cc UF 2969 cc Extubated 8/25. Alert, awake today Patient reports he became nauseus from rolling during sheet change, leading to vomiting x1   Objective:  Vital signs in last 24 hours:  Temp:  [98.1 F (36.7 C)-99.3 F (37.4 C)] 99.3 F (37.4 C) (08/26 0800) Pulse Rate:  [54-103] 101 (08/26 0828) Resp:  [21-35] 23 (08/26 0700) BP: (108-161)/(68-97) 125/68 mmHg (08/26 0828) SpO2:  [90 %-100 %] 99 % (08/26 0825) Weight:  [112.5 kg (248 lb 0.3 oz)] 112.5 kg (248 lb 0.3 oz) (08/26 0500)  Weight change: 0.1 kg (3.5 oz) Filed Weights   06/20/15 0326 06/21/15 0429 06/22/15 0500  Weight: 116.9 kg (257 lb 11.5 oz) 112.4 kg (247 lb 12.8 oz) 112.5 kg (248 lb 0.3 oz)    Intake/Output: I/O last 3 completed shifts: In: 3404.1 [P.O.:240; I.V.:499.6; Other:30; NG/GT:1134.5; IV Piggyback:1500] Out: 3846 [Urine:153; KZLDJ:5701; Stool:50]   Intake/Output this shift:     Physical Exam: General: ill appearing  Head: Normocephalic, atraumatic.  ENT Moist mucus membranes  Neck: Supple, trachea midline  Lungs:  Bilateral rhonchi; basilar crackles  Heart: Irregular S1S2 no rubs  Abdomen:  nontender, mild distension,  Extremities:  ++ generalized  edema  Neurologic:  alert, able to converse   Skin: ulcertaion bilateral lower extremities on shins, ? jaundice   Access: Femoral dialysis catheter    Basic Metabolic Panel:  Recent Labs Lab 06/20/15 1030 06/21/15 0026 06/21/15 0810 06/21/15 1736 06/21/15 1755 06/22/15 0125  NA 140 139 139  --  139 141  K 4.5 4.7 4.4  --  4.6 4.3  CL 106 105 105  --  105 106  CO2 25 28 28   --  28 30  GLUCOSE 137* 144* 147*  --  101* 94  BUN 18 25* 27*  --  30* 30*  CREATININE 1.18 1.23 1.26*  --  1.30* 1.39*  CALCIUM 9.1 9.0 9.0  --  8.9 8.9  MG 1.7 1.9 1.8 1.8  --  1.8  PHOS 2.2* 2.1* 2.0*  --  3.2 2.6    Liver Function Tests:  Recent Labs Lab  06/18/15 0814  06/20/15 0439 06/20/15 1030 06/21/15 0026 06/21/15 0810 06/21/15 1755 06/22/15 0125  AST 30  --  23  --   --   --   --   --   ALT 23  --  19  --   --   --   --   --   ALKPHOS  --   --  53  --   --   --   --   --   BILITOT  --   --  3.3*  --   --   --   --   --   PROT  --   --  6.5  --   --   --   --   --   ALBUMIN 2.3*  < > 3.5 3.3* 3.4* 3.5 3.7 3.8  < > = values in this interval not displayed. No results for input(s): LIPASE, AMYLASE in the last 168 hours.  Recent Labs Lab 06/18/15 1115  AMMONIA 24    CBC:  Recent Labs Lab 06/16/15 0006 06/17/15 0423 06/18/15 0408 06/20/15 0439 06/21/15 0507  WBC 5.4 5.3 5.1 8.4 8.3  NEUTROABS  --   --   --   --  7.1*  HGB 11.0* 10.8* 10.9* 12.3* 11.3*  HCT 34.3* 33.9* 33.4* 37.0* 34.6*  MCV 104.1* 103.0* 103.3* 102.3* 102.3*  PLT 36* 33* 34* 40* 34*    Cardiac Enzymes: No results for input(s): CKTOTAL, CKMB, CKMBINDEX, TROPONINI in the last 168 hours.  BNP: Invalid input(s): POCBNP  CBG:  Recent Labs Lab 06/21/15 1654 06/21/15 2003 06/21/15 2359 06/22/15 0403 06/22/15 0854  GLUCAP 82 78 72 73 70    Microbiology: Results for orders placed or performed during the hospital encounter of 06/10/15  Blood Culture (routine x 2)     Status: None   Collection Time: 06/10/15  2:35 PM  Result Value Ref Range Status   Specimen Description BLOOD RIGHT WRIST  Final   Special Requests BOTTLES DRAWN AEROBIC AND ANAEROBIC  [place]  Final   Culture NO GROWTH 5 DAYS  Final   Report Status 06/15/2015 FINAL  Final  Blood Culture (routine x 2)     Status: None   Collection Time: 06/10/15  2:40 PM  Result Value Ref Range Status   Specimen Description BLOOD LEFT ASSIST CONTROL  Final   Special Requests   Final    BOTTLES DRAWN AEROBIC AND ANAEROBIC  AER 6CC ANA 4CC   Culture NO GROWTH 5 DAYS  Final   Report Status 06/15/2015 FINAL  Final  Urine culture     Status: None   Collection Time: 06/10/15  4:42 PM  Result Value  Ref Range Status   Specimen Description URINE, RANDOM  Final   Special Requests NONE  Final   Culture 4,000 COLONIES/mL INSIGNIFICANT GROWTH  Final   Report Status 06/12/2015 FINAL  Final  MRSA PCR Screening     Status: None   Collection Time: 06/10/15  6:27 PM  Result Value Ref Range Status   MRSA by PCR NEGATIVE NEGATIVE Final    Comment:        The GeneXpert MRSA Assay (FDA approved for NASAL specimens only), is one component of a comprehensive MRSA colonization surveillance program. It is not intended to diagnose MRSA infection nor to guide or monitor treatment for MRSA infections.   Culture, sputum-assessment     Status: None   Collection Time: 06/11/15  2:50 PM  Result Value Ref Range Status   Specimen Description SPUTUM  Final   Special Requests NONE  Final   Sputum evaluation THIS SPECIMEN IS ACCEPTABLE FOR SPUTUM CULTURE  Final   Report Status 06/13/2015 FINAL  Final  Culture, respiratory (NON-Expectorated)     Status: None   Collection Time: 06/11/15  2:50 PM  Result Value Ref Range Status   Specimen Description SPUTUM  Final   Special Requests NONE Reflexed from [id number]  Final   Gram Stain   Final    MODERATE WBC SEEN RARE GRAM POSITIVE COCCI GOOD SPECIMEN - 80-90% WBCS    Culture MODERATE GROWTH STAPHYLOCOCCUS AUREUS  Final   Report Status 06/14/2015 FINAL  Final   Organism ID, Bacteria STAPHYLOCOCCUS AUREUS  Final      Susceptibility   Staphylococcus aureus - MIC*    CIPROFLOXACIN <=0.5 SENSITIVE Sensitive     GENTAMICIN <=0.5 SENSITIVE Sensitive     OXACILLIN 0.5 SENSITIVE Sensitive     TRIMETH/SULFA <=10 SENSITIVE Sensitive     CEFOXITIN SCREEN NEGATIVE Sensitive     Inducible Clindamycin NEGATIVE Sensitive     TETRACYCLINE Value in next row Sensitive      SENSITIVE<=1    * MODERATE GROWTH STAPHYLOCOCCUS AUREUS  Wound culture     Status: None   Collection  Time: 06/13/15  1:56 PM  Result Value Ref Range Status   Specimen Description LEG  Final    Special Requests Normal  Final   Gram Stain   Final    FEW WBC SEEN MODERATE GRAM NEGATIVE RODS FEW GRAM POSITIVE COCCI    Culture   Final    LIGHT GROWTH STAPHYLOCOCCUS AUREUS RARE GROWTH PROTEUS PENNERI RARE GROWTH ENTEROCOCCUS FAECALIS    Report Status 06/17/2015 FINAL  Final   Organism ID, Bacteria STAPHYLOCOCCUS AUREUS  Final   Organism ID, Bacteria PROTEUS PENNERI  Final   Organism ID, Bacteria ENTEROCOCCUS FAECALIS  Final      Susceptibility   Staphylococcus aureus - MIC*    CIPROFLOXACIN <=0.5 SENSITIVE Sensitive     GENTAMICIN <=0.5 SENSITIVE Sensitive     OXACILLIN 0.5 SENSITIVE Sensitive     TRIMETH/SULFA <=10 SENSITIVE Sensitive     CEFOXITIN SCREEN NEGATIVE Sensitive     Inducible Clindamycin NEGATIVE Sensitive     TETRACYCLINE Value in next row Sensitive      SENSITIVE<=1    * LIGHT GROWTH STAPHYLOCOCCUS AUREUS   Proteus penneri - MIC*    AMPICILLIN Value in next row Resistant      SENSITIVE<=1    CEFAZOLIN Value in next row Resistant      SENSITIVE<=1    CEFTRIAXONE Value in next row Sensitive      SENSITIVE<=1    CIPROFLOXACIN Value in next row Sensitive      SENSITIVE<=1    GENTAMICIN Value in next row Sensitive      SENSITIVE<=1    IMIPENEM Value in next row Sensitive      SENSITIVE<=1    NITROFURANTOIN Value in next row Resistant      SENSITIVE<=1    TRIMETH/SULFA Value in next row Sensitive      SENSITIVE<=1    PIP/TAZO Value in next row Sensitive      SENSITIVE<=4    * RARE GROWTH PROTEUS PENNERI   Enterococcus faecalis - MIC*    AMPICILLIN Value in next row Sensitive      SENSITIVE<=4    LINEZOLID Value in next row Sensitive      SENSITIVE<=4    * RARE GROWTH ENTEROCOCCUS FAECALIS  Wound culture     Status: None   Collection Time: 06/13/15  1:58 PM  Result Value Ref Range Status   Specimen Description LEG  Final   Special Requests Normal  Final   Gram Stain RARE WBC SEEN FEW GRAM NEGATIVE RODS   Final   Culture NO GROWTH 4 DAYS  Final    Report Status 06/17/2015 FINAL  Final    Coagulation Studies: No results for input(s): LABPROT, INR in the last 72 hours.  Urinalysis: No results for input(s): COLORURINE, LABSPEC, PHURINE, GLUCOSEU, HGBUR, BILIRUBINUR, KETONESUR, PROTEINUR, UROBILINOGEN, NITRITE, LEUKOCYTESUR in the last 72 hours.  Invalid input(s): APPERANCEUR    Imaging: No results found.   Medications:   . dexmedetomidine Stopped (06/21/15 1140)  . pureflow 2,500 mL/hr at 06/21/15 1630   . sodium chloride   Intravenous Once  . albumin human  12.5 g Intravenous Q6H  . ampicillin-sulbactam (UNASYN) IV  3 g Intravenous Q8H  . antiseptic oral rinse  7 mL Mouth Rinse QID  . chlorhexidine gluconate  15 mL Mouth Rinse BID  . ciprofloxacin  400 mg Intravenous Q12H  . clonazePAM  0.25 mg Oral BID  . famotidine  20 mg Oral Q48H  . feeding supplement (PRO-STAT SUGAR FREE 64)  30  mL Oral TID  . heparin subcutaneous  5,000 Units Subcutaneous Q12H  . ipratropium-albuterol  3 mL Nebulization Q6H  . lactulose  20 g Oral BID  . metoprolol tartrate  25 mg Oral Q6H   ammonium lactate, anticoagulant sodium citrate, fentaNYL (SUBLIMAZE) injection, ipratropium-albuterol, rocuronium, senna-docusate  Assessment/ Plan:  64 y.o. male with a PMHx of congestive heart failure ejection fraction 30-35%, prior pneumonia, lower extremity edema with cellulitis, atrial fibrillation who was admitted to [facility] on 06/10/2015 for evaluation of altered mental status and shortness of breath. Upon admission the patient underwent intubation with transition to a mechanical ventilation.  1. Acute renal failure/chronic kidney disease stage III with baseline creatinine 1.3/proteinuria. The patient most likely has acute renal failure secondary to altered cardiorenal hemodynamics. His ejection fraction is low at 30%. - remains off pressors - Oliguria persists, .   -  Trial of iv lasix x 1 - if UOP remains low, will dialyze tomorrow  2. Acute  respiratory failure. Remains on the vent, continue volume removal to treat underlying heart failure/pulmonary edema.   3.anasarca from  Acute systolic heart failure. EF 30-35%. - iv lasix    LOS: 12 Aaron Harrison 8/26/20169:06 AM

## 2015-06-22 NOTE — Progress Notes (Signed)
Palliative Medicine Inpatient Consult Follow Up Note   Name: Aaron Harrison Date: 06/22/2015 MRN: [id number]  DOB: [birth year]  Referring Physician: Fritzi Mandes, MD  Palliative Care consult requested for this 64 y.o. male for goals of medical therapy in patient with respiratory failure and multi-organ dysfunction --now extubated as of yesterday.  TODAY'S CONVERSATIONS, EVENTS, AND PLAN: 1)  He was able to complete a HCPOA and Living Will Document with the Chaplain today.  I had to talk with him quite a while about this b/c he strongly wanted his attorney friend, Aaron Harrison, to come and do this form for him.  But, since pt is naming Aaron Harrison as his HCPOA, it is not appropriate for Aaron Harrison to complete the document.  Pt understood and with this explanation, he proceeded to get this done with the Chaplain today.    Hence, Aaron Harrison, is, as of now, the one to call when pt cannot make decisions for himself.  DNR continues.  No re-intubation.   2)  Pt confirms he does not want to be on the vent again, have resuscitation, or get a feeding tube (a permanent one like a PEG).  He confirms that dialysis is OK. This is exactly what he conveyed yesterday, so his wishes are consistent with what he related then.   3)  Pt is still quite medically ill with critical care team directing management still.  He might not come out of ICU this weekend.  Defer all medical management to critical care and attending, but will be available to continue to answer questions related to Palliative matters on Monday - Friday.    REVIEW OF SYSTEMS:  No pain.  Tired.    CODE STATUS: DNR   PAST MEDICAL HISTORY: Past Medical History  Diagnosis Date  . Chronic atrial fibrillation     a. not on long term anticoagulation  . Pneumonia 2011  . Chronic systolic CHF (congestive heart failure)   . Cellulitis     PAST SURGICAL HISTORY: History reviewed. No pertinent past surgical history.  Vital Signs: BP 131/77 mmHg  Pulse 102   Temp(Src) 99.5 F (37.5 C) (Other (Comment))  Resp 33  Ht 5\' 8"  (1.727 m)  Wt 112.5 kg (248 lb 0.3 oz)  BMI 37.72 kg/m2  SpO2 99% Filed Weights   06/20/15 0326 06/21/15 0429 06/22/15 0500  Weight: 116.9 kg (257 lb 11.5 oz) 112.4 kg (247 lb 12.8 oz) 112.5 kg (248 lb 0.3 oz)    Estimated body mass index is 37.72 kg/(m^2) as calculated from the following:   Height as of this encounter: 5\' 8"  (1.727 m).   Weight as of this encounter: 112.5 kg (248 lb 0.3 oz).  PHYSICAL EXAM: He wakens easily and knows where he is He does repeat himself and at times has to be reminded of things just told to him, but he is overall able to make judgements about his wishes, etc EOMI OP clear Neck no JVD or TM Heart irreg irreg Lungs with some ronchi and sats drop to 83 off of nasal cannula oxygen at 2 LPM Abd soft sounds --very protuberant  LABS: CBC:    Component Value Date/Time   WBC 8.3 06/21/2015 0507   WBC 19.5* 07/16/2013 0239   HGB 11.3* 06/21/2015 0507   HGB 15.2 07/16/2013 0239   HCT 34.6* 06/21/2015 0507   HCT 43.8 07/16/2013 0239   PLT 34* 06/21/2015 0507   PLT 179 07/16/2013 0239   MCV 102.3* 06/21/2015 0507   MCV  90 07/16/2013 0239   NEUTROABS 7.1* 06/21/2015 0507   LYMPHSABS 0.6* 06/21/2015 0507   MONOABS 0.5 06/21/2015 0507   EOSABS 0.1 06/21/2015 0507   BASOSABS 0.0 06/21/2015 0507   Comprehensive Metabolic Panel:    Component Value Date/Time   NA 140 06/22/2015 0820   NA 137 07/16/2013 0239   K 4.3 06/22/2015 0820   K 3.0* 07/16/2013 0239   CL 105 06/22/2015 0820   CL 103 07/16/2013 0239   CO2 27 06/22/2015 0820   CO2 25 07/16/2013 0239   BUN 33* 06/22/2015 0820   BUN 19* 07/16/2013 0239   CREATININE 1.80* 06/22/2015 0820   CREATININE 1.53* 07/16/2013 0239   GLUCOSE 87 06/22/2015 0820   GLUCOSE 198* 07/16/2013 0239   CALCIUM 8.9 06/22/2015 0820   CALCIUM 8.8 07/16/2013 0239   AST 40 06/22/2015 0820   AST 22 07/16/2013 0239   ALT 28 06/22/2015 0820   ALT 23  07/16/2013 0239   ALKPHOS 47 06/22/2015 0820   ALKPHOS 91 07/16/2013 0239   BILITOT 2.7* 06/22/2015 0820   BILITOT 1.8* 07/16/2013 0239   PROT 6.5 06/22/2015 0820   PROT 7.3 07/16/2013 0239   ALBUMIN 3.7 06/22/2015 0820   ALBUMIN 3.8 06/22/2015 0820   ALBUMIN 4.1 07/16/2013 0239    IMPRESSION:  1) Acute Respiratory Failure due to pneumonia (RUL infiltrate on adm CXR) and pulmonary edema/ CHF ---intubated in the ED 06/10/15 ---extubated 06/12/15 but failed BIPAP and was re-intubated urgently on 06/13/15 ---NOW EXTUBATED AND TALKING --- Crit Care managing 2) Metabolic Encephalopathy ---he is awake and talking and (mostly) making sense --though he had some confusion (sundowning) last evening.  ---he has some short term recall issues  ---he is, however, grossly oriented to self, others, date, location, and situation.  2) Community Acquired Pneumonia ---Sputum Cx is growing MSSA (Bld and Ur Cxs neg) ---Treated with ABX as per ID consultant (Zosyn/ Vanco --then Unasyn ) ---Crit Care managing. 3) Afib with RVR w/ HR of about 150 at adm --and NSVT 11 beats last pm ---Followed by Cardiology (Ryan Dunn PA-C and Aris Everts MD)  ---rate controlling medications per his acute care team ---anticoagulation is an issue due to platelets dropping steadily 4) Acute on Chronic Systolic Congestive Heart Failure with EF of 30-35% ---with chronic lower leg edema worse recently at home (pt also says he didn't like to take the lasix he was prescribed b/c it 'kept him in the bathroom'.  5) Suspected to have ischemic heart disease ---Mild elevations of troponin noted without diagnosis of MI (demand ischemia)  6) Acute Renal Failure on Chronic Kidney Disease stage 3 ---Likely due to ATN related to poor cardiac function  ---with baseline creatinine 1.3 and proteinuria.  ---CRRT lines became clogged last night ---management per nephrologist 7) Leg Cellulitis (related to venous stasis  edema)--treated with Unasyn per ID ---being changed over to oral ABX ---improved ---has prevented use of SCDs for VTE prophylaxis 8) Anemia of chronic disease and kidney disease 9) Metabolic Acidosis due to critical illness/ renal disease 10) Electrolyte Disarray --managed daily  11) History of Liver Disease --apparently in 2011 when he had organ failure and HD twice (per pts report) ---Ammonia level only 26 on 06/10/15 ---Patient says he was on lactulose in 2011 when they told him his liver failed and he had a hospital stay for 'everything shutting down'. Pt says he does not drink alcohol . He doesn't know when he quit taking the lactulose but says he wasn't on  it recently.  12) thrombocytopenia  --- Crit Care managing/ directing options for VTE prophylaxis 13) Sundowning.  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 14) Vomiting --vomited twice today   See above for plan.  REFERRALS TO BE ORDERED:   I spoke with care mgmt and social worker and updated them about pts new HCPOA  And Living Will and anticipated lengthy recovery needs etc.    More than 50% of the visit was spent in counseling/coordination of care: YES  Time Spent:  22min

## 2015-06-22 NOTE — Progress Notes (Signed)
PT Cancellation Note  Patient Details Name: WACEY ZIEGER MRN: [id number] DOB: 1951-09-14   Cancelled Treatment:    Reason Eval/Treat Not Completed: Patient not medically ready  Spoke with nursing, she reports that pt has still been having nausea/vomiting and suggests that it would be better to wait until tomorrow to try to see him.   Wayne Both, PT, DPT [phone]  Kreg Shropshire 06/22/2015, 3:31 PM

## 2015-06-22 NOTE — Progress Notes (Signed)
Venous port treated with alteplase, patent, flushes w/blood return noted. Resume CRRT.

## 2015-06-23 DIAGNOSIS — I130 Hypertensive heart and chronic kidney disease with heart failure and stage 1 through stage 4 chronic kidney disease, or unspecified chronic kidney disease: Secondary | ICD-10-CM

## 2015-06-23 LAB — RENAL FUNCTION PANEL
ALBUMIN: 3.4 g/dL — AB (ref 3.5–5.0)
ANION GAP: 10 (ref 5–15)
BUN: 47 mg/dL — ABNORMAL HIGH (ref 6–20)
CALCIUM: 8.6 mg/dL — AB (ref 8.9–10.3)
CO2: 27 mmol/L (ref 22–32)
CREATININE: 2.97 mg/dL — AB (ref 0.61–1.24)
Chloride: 106 mmol/L (ref 101–111)
GFR, EST AFRICAN AMERICAN: 24 mL/min — AB (ref >60)
GFR, EST NON AFRICAN AMERICAN: 21 mL/min — AB (ref >60)
Glucose, Bld: 88 mg/dL (ref 65–99)
PHOSPHORUS: 3.6 mg/dL (ref 2.5–4.6)
Potassium: 4.2 mmol/L (ref 3.5–5.1)
SODIUM: 143 mmol/L (ref 135–145)

## 2015-06-23 LAB — GLUCOSE, CAPILLARY
GLUCOSE-CAPILLARY: 93 mg/dL (ref 65–99)
Glucose-Capillary: 80 mg/dL (ref 65–99)
Glucose-Capillary: 93 mg/dL (ref 65–99)
Glucose-Capillary: 115 mg/dL — ABNORMAL HIGH (ref 65–99)

## 2015-06-23 LAB — MAGNESIUM: MAGNESIUM: 1.9 mg/dL (ref 1.7–2.4)

## 2015-06-23 MED ORDER — STERILE WATER FOR INJECTION IJ SOLN
INTRAMUSCULAR | Status: AC
  Administered 2015-06-23: 10 mL
  Filled 2015-06-23: qty 10

## 2015-06-23 MED ORDER — PROMETHAZINE HCL 25 MG/ML IJ SOLN
12.5000 mg | INTRAMUSCULAR | Status: AC
  Administered 2015-06-23: 12.5 mg via INTRAVENOUS
  Filled 2015-06-23 (×2): qty 1

## 2015-06-23 MED ORDER — AMOXICILLIN-POT CLAVULANATE 875-125 MG PO TABS
1.0000 | ORAL_TABLET | Freq: Two times a day (BID) | ORAL | Status: DC
Start: 2015-06-23 — End: 2015-06-24
  Administered 2015-06-23 – 2015-06-24 (×2): 1 via ORAL
  Filled 2015-06-23 (×3): qty 1

## 2015-06-23 NOTE — Progress Notes (Signed)
[place] NOTE  Pharmacy Consult for Electrolytes/CRRT dose adjustment    Allergies  Allergen Reactions  . Penicillins Nausea And Vomiting    Patient Measurements: Height: 5\' 8"  (172.7 cm) Weight: 245 lb 9.5 oz (111.4 kg) IBW/kg (Calculated) : 68.4   Vital Signs: Temp: 98.4 F (36.9 C) (08/27 1300) Temp Source: Oral (08/27 1300) BP: 158/80 mmHg (08/27 1300) Pulse Rate: 106 (08/27 1300) Intake/Output from previous day: 08/26 0701 - 08/27 0700 In: 350 [IV Piggyback:350] Out: 1425 [Urine:950; Emesis/NG output:450; Stool:25] Intake/Output from this shift: Total I/O In: 100 [IV Piggyback:100] Out: -  Vent settings for last 24 hours:    Labs:  Recent Labs  06/21/15 0507  06/22/15 0820 06/22/15 2039 06/23/15 0502  WBC 8.3  --   --   --   --   HGB 11.3*  --   --   --   --   HCT 34.6*  --   --   --   --   PLT 34*  --   --   --   --   CREATININE  --   < > 1.80* 2.50* 2.97*  MG  --   < > 1.9 1.8 1.9  PHOS  --   < > 2.7 3.2 3.6  ALBUMIN  --   < > 3.8  3.7 3.6 3.4*  PROT  --   --  6.5  --   --   AST  --   --  40  --   --   ALT  --   --  28  --   --   ALKPHOS  --   --  47  --   --   BILITOT  --   --  2.7*  --   --   BILIDIR  --   --  1.2*  --   --   IBILI  --   --  1.5*  --   --   < > = values in this interval not displayed. Estimated Creatinine Clearance: 31.2 mL/min (by C-G formula based on Cr of 2.97).   Recent Labs  06/23/15 0352 06/23/15 0723 06/23/15 1115  GLUCAP 93 80 93   BMP Latest Ref Rng 06/23/2015 06/22/2015 06/22/2015  Glucose 65 - 99 mg/dL 88 95 87  BUN 6 - 20 mg/dL 47(H) 43(H) 33(H)  Creatinine 0.61 - 1.24 mg/dL 2.97(H) 2.50(H) 1.80(H)  Sodium 135 - 145 mmol/L 143 141 140  Potassium 3.5 - 5.1 mmol/L 4.2 4.3 4.3  Chloride 101 - 111 mmol/L 106 104 105  CO2 22 - 32 mmol/L 27 27 27   Calcium 8.9 - 10.3 mg/dL 8.6(L) 8.7(L) 8.9     Microbiology: Recent Results (from the past 720 hour(s))  Blood Culture (routine x 2)      Status: None   Collection Time: 06/10/15  2:35 PM  Result Value Ref Range Status   Specimen Description BLOOD RIGHT WRIST  Final   Special Requests BOTTLES DRAWN AEROBIC AND ANAEROBIC  1CC  Final   Culture NO GROWTH 5 DAYS  Final   Report Status 06/15/2015 FINAL  Final  Blood Culture (routine x 2)     Status: None   Collection Time: 06/10/15  2:40 PM  Result Value Ref Range Status   Specimen Description BLOOD LEFT ASSIST CONTROL  Final   Special Requests   Final    BOTTLES DRAWN AEROBIC AND ANAEROBIC  AER 6CC ANA 4CC   Culture NO GROWTH 5 DAYS  Final   Report Status 06/15/2015 FINAL  Final  Urine culture     Status: None   Collection Time: 06/10/15  4:42 PM  Result Value Ref Range Status   Specimen Description URINE, RANDOM  Final   Special Requests NONE  Final   Culture 4,000 COLONIES/mL INSIGNIFICANT GROWTH  Final   Report Status 06/12/2015 FINAL  Final  MRSA PCR Screening     Status: None   Collection Time: 06/10/15  6:27 PM  Result Value Ref Range Status   MRSA by PCR NEGATIVE NEGATIVE Final    Comment:        The GeneXpert MRSA Assay (FDA approved for NASAL specimens only), is one component of a comprehensive MRSA colonization surveillance program. It is not intended to diagnose MRSA infection nor to guide or monitor treatment for MRSA infections.   Culture, sputum-assessment     Status: None   Collection Time: 06/11/15  2:50 PM  Result Value Ref Range Status   Specimen Description SPUTUM  Final   Special Requests NONE  Final   Sputum evaluation THIS SPECIMEN IS ACCEPTABLE FOR SPUTUM CULTURE  Final   Report Status 06/13/2015 FINAL  Final  Culture, respiratory (NON-Expectorated)     Status: None   Collection Time: 06/11/15  2:50 PM  Result Value Ref Range Status   Specimen Description SPUTUM  Final   Special Requests NONE Reflexed from [id number]  Final   Gram Stain   Final    MODERATE WBC SEEN RARE GRAM POSITIVE COCCI GOOD SPECIMEN - 80-90% WBCS    Culture  MODERATE GROWTH STAPHYLOCOCCUS AUREUS  Final   Report Status 06/14/2015 FINAL  Final   Organism ID, Bacteria STAPHYLOCOCCUS AUREUS  Final      Susceptibility   Staphylococcus aureus - MIC*    CIPROFLOXACIN <=0.5 SENSITIVE Sensitive     GENTAMICIN <=0.5 SENSITIVE Sensitive     OXACILLIN 0.5 SENSITIVE Sensitive     TRIMETH/SULFA <=10 SENSITIVE Sensitive     CEFOXITIN SCREEN NEGATIVE Sensitive     Inducible Clindamycin NEGATIVE Sensitive     TETRACYCLINE Value in next row Sensitive      SENSITIVE<=1    * MODERATE GROWTH STAPHYLOCOCCUS AUREUS  Wound culture     Status: None   Collection Time: 06/13/15  1:56 PM  Result Value Ref Range Status   Specimen Description LEG  Final   Special Requests Normal  Final   Gram Stain   Final    FEW WBC SEEN MODERATE GRAM NEGATIVE RODS FEW GRAM POSITIVE COCCI    Culture   Final    LIGHT GROWTH STAPHYLOCOCCUS AUREUS RARE GROWTH PROTEUS PENNERI RARE GROWTH ENTEROCOCCUS FAECALIS    Report Status 06/17/2015 FINAL  Final   Organism ID, Bacteria STAPHYLOCOCCUS AUREUS  Final   Organism ID, Bacteria PROTEUS PENNERI  Final   Organism ID, Bacteria ENTEROCOCCUS FAECALIS  Final      Susceptibility   Staphylococcus aureus - MIC*    CIPROFLOXACIN <=0.5 SENSITIVE Sensitive     GENTAMICIN <=0.5 SENSITIVE Sensitive     OXACILLIN 0.5 SENSITIVE Sensitive     TRIMETH/SULFA <=10 SENSITIVE Sensitive     CEFOXITIN SCREEN NEGATIVE Sensitive     Inducible Clindamycin NEGATIVE Sensitive     TETRACYCLINE Value in next row Sensitive      SENSITIVE<=1    * LIGHT GROWTH STAPHYLOCOCCUS AUREUS   Proteus penneri - MIC*    AMPICILLIN Value in next row Resistant      SENSITIVE<=1  CEFAZOLIN Value in next row Resistant      SENSITIVE<=1    CEFTRIAXONE Value in next row Sensitive      SENSITIVE<=1    CIPROFLOXACIN Value in next row Sensitive      SENSITIVE<=1    GENTAMICIN Value in next row Sensitive      SENSITIVE<=1    IMIPENEM Value in next row Sensitive       SENSITIVE<=1    NITROFURANTOIN Value in next row Resistant      SENSITIVE<=1    TRIMETH/SULFA Value in next row Sensitive      SENSITIVE<=1    PIP/TAZO Value in next row Sensitive      SENSITIVE<=4    * RARE GROWTH PROTEUS PENNERI   Enterococcus faecalis - MIC*    AMPICILLIN Value in next row Sensitive      SENSITIVE<=4    LINEZOLID Value in next row Sensitive      SENSITIVE<=4    * RARE GROWTH ENTEROCOCCUS FAECALIS  Wound culture     Status: None   Collection Time: 06/13/15  1:58 PM  Result Value Ref Range Status   Specimen Description LEG  Final   Special Requests Normal  Final   Gram Stain RARE WBC SEEN FEW GRAM NEGATIVE RODS   Final   Culture NO GROWTH 4 DAYS  Final   Report Status 06/17/2015 FINAL  Final    Medications:  Scheduled:  . sodium chloride   Intravenous Once  . ampicillin-sulbactam (UNASYN) IV  3 g Intravenous Q6H  . famotidine (PEPCID) IV  20 mg Intravenous Q24H  . famotidine  20 mg Oral Q48H  . feeding supplement  1 Container Oral TID WC  . ipratropium-albuterol  3 mL Nebulization Q6H  . lactulose  20 g Oral BID  . metoprolol  2.5 mg Intravenous 4 times per day  . metoprolol tartrate  25 mg Oral Q6H    Assessment: 64 y/o M with acute respiratory failure now extubated. Patient off CRRT and may begin HD tomorrow per nephrology.   Plan:  Electrolytes are WNL. Will f/u AM labs.  Kalee Broxton K, RPH 06/23/2015,1:55 PM

## 2015-06-23 NOTE — Evaluation (Signed)
Physical Therapy Evaluation Patient Details Name: Aaron Harrison MRN: [id number] DOB: 22-May-1951 Today's Date: 06/23/2015   History of Present Illness   (CHF, acute respiratory failure)  Clinical Impression  Pt had been intubated for ~10 days, is now on nasal cannula with O2 sats in the high 90s (HR generally in the 110s t/o session).  He is very weak in U&LEs and fatigues very quickly with the 9 minutes of bed exercises apart from the exam.      Follow Up Recommendations SNF    Equipment Recommendations       Recommendations for Other Services       Precautions / Restrictions Precautions Precautions: Fall Restrictions Weight Bearing Restrictions: No Other Position/Activity Restrictions: pt with temp cath, deferred mobility      Mobility  Bed Mobility Overal bed mobility:  (unable; deferred mobility secondary to severe weakness)                Transfers                    Ambulation/Gait                Stairs            Wheelchair Mobility    Modified Rankin (Stroke Patients Only)       Balance                                             Pertinent Vitals/Pain Pain Assessment: No/denies pain    Home Living Family/patient expects to be discharged to:: Assisted living               Home Equipment: Walker - 2 wheels      Prior Function Level of Independence: Independent               Hand Dominance        Extremity/Trunk Assessment   Upper Extremity Assessment: Generalized weakness           Lower Extremity Assessment: Generalized weakness (pt with very limited ROM and functional strength)         Communication   Communication: No difficulties  Cognition Arousal/Alertness: Lethargic Behavior During Therapy: Restless Overall Cognitive Status: Within Functional Limits for tasks assessed                      General Comments      Exercises General Exercises - Lower  Extremity Ankle Circles/Pumps: AAROM;10 reps Quad Sets: AROM;10 reps Heel Slides: AAROM;10 reps Hip ABduction/ADduction: AROM;10 reps      Assessment/Plan    PT Assessment Patient needs continued PT services  PT Diagnosis Difficulty walking;Generalized weakness   PT Problem List Decreased strength;Decreased activity tolerance;Decreased balance;Decreased mobility;Decreased coordination;Decreased cognition;Decreased safety awareness  PT Treatment Interventions Gait training;Functional mobility training;Therapeutic exercise;Therapeutic activities;Balance training;Neuromuscular re-education;Patient/family education   PT Goals (Current goals can be found in the Care Plan section) Acute Rehab PT Goals Patient Stated Goal: to get stronger PT Goal Formulation: With patient Time For Goal Achievement: 07/07/15 Potential to Achieve Goals: Fair    Frequency Min 2X/week   Barriers to discharge        Co-evaluation               End of Session   Activity Tolerance: Patient limited by fatigue (pt's HR in the 110s most  of the time) Patient left: with bed alarm set           Time: 1150-1208 PT Time Calculation (min) (ACUTE ONLY): 18 min   Charges:   PT Evaluation $Initial PT Evaluation Tier I: 1 Procedure PT Treatments $Therapeutic Exercise: 8-22 mins   PT G Codes:        Kreg Shropshire July 03, 2015, 1:38 PM

## 2015-06-23 NOTE — Progress Notes (Signed)
SUBJECTIVE:  No chest pain.  No SOB.  Fatigued   PHYSICAL EXAM Filed Vitals:   06/23/15 0900 06/23/15 1000 06/23/15 1100 06/23/15 1109  BP: 146/69 146/72 144/89   Pulse: 101 108 106 106  Temp: 99.5 F (37.5 C) 99.9 F (37.7 C) 99.9 F (37.7 C)   TempSrc:      Resp: 35 30 35 31  Height:      Weight:      SpO2: 100% 100% 100% 100%   General:  No distress Lungs:  Clear Heart:  Irregular Abdomen:  Positive bowel sounds, no rebound no guarding Extremities:  Dependent edema moderately severe  LABS:  Results for orders placed or performed during the hospital encounter of 06/10/15 (from the past 24 hour(s))  Glucose, capillary     Status: None   Collection Time: 06/22/15  4:19 PM  Result Value Ref Range   Glucose-Capillary 90 65 - 99 mg/dL  Glucose, capillary     Status: None   Collection Time: 06/22/15  7:59 PM  Result Value Ref Range   Glucose-Capillary 93 65 - 99 mg/dL  Renal function     Status: Abnormal   Collection Time: 06/22/15  8:39 PM  Result Value Ref Range   Sodium 141 135 - 145 mmol/L   Potassium 4.3 3.5 - 5.1 mmol/L   Chloride 104 101 - 111 mmol/L   CO2 27 22 - 32 mmol/L   Glucose, Bld 95 65 - 99 mg/dL   BUN 43 (H) 6 - 20 mg/dL   Creatinine, Ser 2.50 (H) 0.61 - 1.24 mg/dL   Calcium 8.7 (L) 8.9 - 10.3 mg/dL   Phosphorus 3.2 2.5 - 4.6 mg/dL   Albumin 3.6 3.5 - 5.0 g/dL   GFR calc non Af Amer 26 (L) >60 mL/min   GFR calc Af Amer 30 (L) >60 mL/min   Anion gap 10 5 - 15  Magnesium     Status: None   Collection Time: 06/22/15  8:39 PM  Result Value Ref Range   Magnesium 1.8 1.7 - 2.4 mg/dL  Glucose, capillary     Status: None   Collection Time: 06/23/15  3:52 AM  Result Value Ref Range   Glucose-Capillary 93 65 - 99 mg/dL   Comment 1 Notify RN   Renal function panel     Status: Abnormal   Collection Time: 06/23/15  5:02 AM  Result Value Ref Range   Sodium 143 135 - 145 mmol/L   Potassium 4.2 3.5 - 5.1 mmol/L   Chloride 106 101 - 111 mmol/L   CO2  27 22 - 32 mmol/L   Glucose, Bld 88 65 - 99 mg/dL   BUN 47 (H) 6 - 20 mg/dL   Creatinine, Ser 2.97 (H) 0.61 - 1.24 mg/dL   Calcium 8.6 (L) 8.9 - 10.3 mg/dL   Phosphorus 3.6 2.5 - 4.6 mg/dL   Albumin 3.4 (L) 3.5 - 5.0 g/dL   GFR calc non Af Amer 21 (L) >60 mL/min   GFR calc Af Amer 24 (L) >60 mL/min   Anion gap 10 5 - 15  Magnesium     Status: None   Collection Time: 06/23/15  5:02 AM  Result Value Ref Range   Magnesium 1.9 1.7 - 2.4 mg/dL  Glucose, capillary     Status: None   Collection Time: 06/23/15  7:23 AM  Result Value Ref Range   Glucose-Capillary 80 65 - 99 mg/dL  Glucose, capillary     Status:  None   Collection Time: 06/23/15 11:15 AM  Result Value Ref Range   Glucose-Capillary 93 65 - 99 mg/dL    Intake/Output Summary (Last 24 hours) at 06/23/15 1212 Last data filed at 06/23/15 0946  Gross per 24 hour  Intake    450 ml  Output   1325 ml  Net   -875 ml     ASSESSMENT AND PLAN:  Acute on chronic systolic HF:   I have reviewed the chart.  There are little date to use NOAC at this level of creat clearance.  He has multiple high risk bleeding features for long term anticoagulation.  Per Dr. Donivan Scull last note, he thought the patient would not be a good candidate for long term anticoagulation.  I will stop the Eliquis.  The risk of bleeding outweighs the risk of stopping the meds.    Atrial fib with RVR:   Rate is somewhat fast.  However, he just finished PT.    CKD:  Creat clearance is 40.  UO has improved.  Nephrology is following.  CRRT has stopped.    Jeneen Rinks Tenya Araque 06/23/2015 12:12 PM

## 2015-06-23 NOTE — Progress Notes (Signed)
[facility] [place] NAME: Aaron Harrison    MR#:  [id number]  DATE OF BIRTH:  1951-08-26  SUBJECTIVE:  No complaints. Dialysis held due to HD Cath clotting.  oliguria    Review of Systems  Constitutional: Negative for fever, chills and weight loss.  HENT: Negative for ear discharge, ear pain and nosebleeds.   Eyes: Negative for blurred vision, pain and discharge.  Respiratory: Negative for sputum production, shortness of breath, wheezing and stridor.   Cardiovascular: Negative for chest pain, palpitations, orthopnea and PND.  Gastrointestinal: Positive for vomiting. Negative for nausea, abdominal pain and diarrhea.  Genitourinary: Negative for urgency and frequency.  Musculoskeletal: Negative for back pain and joint pain.  Neurological: Positive for weakness. Negative for sensory change, speech change and focal weakness.  Psychiatric/Behavioral: Negative for depression and hallucinations. The patient is not nervous/anxious.    DRUG ALLERGIES:   Allergies  Allergen Reactions  . Penicillins Nausea And Vomiting    VITALS:  Blood pressure 145/76, pulse 105, temperature 98.3 F (36.8 C), temperature source Oral, resp. rate 35, height 5\' 8"  (1.727 m), weight 111.4 kg (245 lb 9.5 oz), SpO2 99 %.  PHYSICAL EXAMINATION:   Physical Exam  GENERAL:  64 y.o.-year-old patient lying in the bed with no acute distress. critically ill anasarca EYES: Pupils equal, round, reactive to light and accommodation. No scleral icterus. Extraocular muscles intact.  HEENT: Head atraumatic, normocephalic. Oropharynx and nasopharynx clear.  NECK:  Supple, no jugular venous distention. No thyroid enlargement, no tenderness.  LUNGS: Normal breath sounds bilaterally, no wheezing, rales, rhonchi. No use of accessory muscles of respiration. Left upper chest CL+ CARDIOVASCULAR: S1, S2 normal. No murmurs, rubs, or gallops.  ABDOMEN: Soft, nontender,  nondistended. Bowel sounds present. No organomegaly or mass.  -severe scrotal edema EXTREMITIES: bilateral +++ edema b/l with weeping skin ulcers on tibial shin NEUROLOGIC: sedated and on the vent PSYCHIATRIC: on the vent SKIN:bilateral LE cellulitis and weeping skin ulcers over the tibial shin  LABORATORY PANEL:   CBC  Recent Labs Lab 06/21/15 0507  WBC 8.3  HGB 11.3*  HCT 34.6*  PLT 34*    Chemistries   Recent Labs Lab 06/22/15 0820  06/23/15 0502  NA 140  < > 143  K 4.3  < > 4.2  CL 105  < > 106  CO2 27  < > 27  GLUCOSE 87  < > 88  BUN 33*  < > 47*  CREATININE 1.80*  < > 2.97*  CALCIUM 8.9  < > 8.6*  MG 1.9  < > 1.9  AST 40  --   --   ALT 28  --   --   ALKPHOS 47  --   --   BILITOT 2.7*  --   --   < > = values in this interval not displayed.  Cardiac Enzymes No results for input(s): TROPONINI in the last 168 hours.  RADIOLOGY:  Dg Abd Portable 1v  06/22/2015   CLINICAL DATA:  Vomiting. History of CHF and pneumonia. Abdominal distention. Multi organ failure.  EXAM: PORTABLE ABDOMEN - 1 VIEW  COMPARISON:  06/18/2015  FINDINGS: Right femoral dialysis catheter is identified with tip in the projection of the lower IVC. There is marked gaseous distension of the stomach. Gas noted within small and large bowel loops.  IMPRESSION: 1. Marked gaseous distension of the stomach.   Electronically Signed   By: Kerby Moors M.D.   On: 06/22/2015  16:57   ASSESSMENT AND PLAN:  63 y.o. male with a known history of A. fib, CHF came in with  * Acute respiratory failure with hypoxia: intubated in ED, extubated on August 15 - Reintubated on August 17 due to increased work of breathing. -extubated 06/20/15 and doing well  * Sepsis with multiorgan failure: Likely hospital-acquired pneumonia -BC negative -wbc stable -no fever  * MSSA Pneumonia- on  IV Unasyn-->change to po augmentin for 2-3 days  * Acute renal failure: Possible cardiorenal syndrome due to underlying CHF.  Appreciate nephrology input - temporary dialysis catheter placed in the right groin and CRRT started 8/17 -Dialysis cath clotted. Remains oliguric. Lasix trial given -likely will need long term HD  * Acute on chronic systolic CHF  Echo showing ejection fraction 30% to 35% -CRRT with  UF on hold due to catheter issues -pt has anasarca  * A. fib with RVR:  Not able to use rate controlling medication due to hypotension. - On po  Amiodarone -Continue aspirin for now  * Elevated troponin: Due to supply demand ischemia  *Passive PT strated  Case discussed with Care Management/Social Worker. CODE STATUS: DNR DVT Prophylaxis: sq heparin  TOTAL Critical TIME TAKING CARE OF THIS PATIENT: 2minutes.  >50% time spent on counselling and coordination of care with RN, Dr Carlene Coria M.D on 06/23/2015 at 4:40 PM  Between 7am to 6pm - Pager - [phone]  After 6pm go to [email or web address] - password [email or web address] [facility]  [facility]  Office  [phone]  CC: Primary care physician; No PCP Per Patient

## 2015-06-23 NOTE — Progress Notes (Signed)
Subjective:  CRRT d/c'd night of 8/25 UOP has improved after iv lasix.  950 cc last 24 hrs Abdomen is distended  Objective:  Vital signs in last 24 hours:  Temp:  [97.7 F (36.5 C)-99.9 F (37.7 C)] 99.3 F (37.4 C) (08/27 0700) Pulse Rate:  [26-125] 109 (08/27 0800) Resp:  [19-37] 30 (08/27 0800) BP: (117-177)/(75-121) 153/79 mmHg (08/27 0800) SpO2:  [90 %-100 %] 100 % (08/27 0800) Weight:  [111.4 kg (245 lb 9.5 oz)] 111.4 kg (245 lb 9.5 oz) (08/27 0455)  Weight change: -1.1 kg (-2 lb 6.8 oz) Filed Weights   06/21/15 0429 06/22/15 0500 06/23/15 0455  Weight: 112.4 kg (247 lb 12.8 oz) 112.5 kg (248 lb 0.3 oz) 111.4 kg (245 lb 9.5 oz)    Intake/Output: I/O last 3 completed shifts: In: 29 [Other:30; IV Piggyback:750] Out: 2166 [Urine:998; Emesis/NG output:450; WVPXT:062; Stool:25]   Intake/Output this shift:     Physical Exam: General: ill appearing  Head: Normocephalic, atraumatic.  ENT Moist mucus membranes  Neck: Supple, trachea midline  Lungs:  Normal effort  Heart: Irregular S1S2 no rubs  Abdomen:  nontender, mild distension,  Extremities:  + generalized  Edema- improved  Neurologic:  alert, able to converse   Skin: ulcertaion bilateral lower extremities on shins, ? jaundice   Access: Femoral dialysis catheter    Basic Metabolic Panel:  Recent Labs Lab 06/21/15 1736 06/21/15 1755 06/22/15 0125 06/22/15 0820 06/22/15 2039 06/23/15 0502  NA  --  139 141 140 141 143  K  --  4.6 4.3 4.3 4.3 4.2  CL  --  105 106 105 104 106  CO2  --  28 30 27 27 27   GLUCOSE  --  101* 94 87 95 88  BUN  --  30* 30* 33* 43* 47*  CREATININE  --  1.30* 1.39* 1.80* 2.50* 2.97*  CALCIUM  --  8.9 8.9 8.9 8.7* 8.6*  MG 1.8  --  1.8 1.9 1.8 1.9  PHOS  --  3.2 2.6 2.7 3.2 3.6    Liver Function Tests:  Recent Labs Lab 06/18/15 0814  06/20/15 0439  06/21/15 1755 06/22/15 0125 06/22/15 0820 06/22/15 2039 06/23/15 0502  AST 30  --  23  --   --   --  40  --   --   ALT  23  --  19  --   --   --  28  --   --   ALKPHOS  --   --  53  --   --   --  47  --   --   BILITOT  --   --  3.3*  --   --   --  2.7*  --   --   PROT  --   --  6.5  --   --   --  6.5  --   --   ALBUMIN 2.3*  < > 3.5  < > 3.7 3.8 3.8  3.7 3.6 3.4*  < > = values in this interval not displayed. No results for input(s): LIPASE, AMYLASE in the last 168 hours.  Recent Labs Lab 06/18/15 1115 06/22/15 1109  AMMONIA 24 53*    CBC:  Recent Labs Lab 06/17/15 0423 06/18/15 0408 06/20/15 0439 06/21/15 0507  WBC 5.3 5.1 8.4 8.3  NEUTROABS  --   --   --  7.1*  HGB 10.8* 10.9* 12.3* 11.3*  HCT 33.9* 33.4* 37.0* 34.6*  MCV 103.0* 103.3* 102.3* 102.3*  PLT 33* 34* 40* 34*    Cardiac Enzymes: No results for input(s): CKTOTAL, CKMB, CKMBINDEX, TROPONINI in the last 168 hours.  BNP: Invalid input(s): POCBNP  CBG:  Recent Labs Lab 06/22/15 1143 06/22/15 1619 06/22/15 1959 06/23/15 0352 06/23/15 0723  GLUCAP 84 90 93 93 80    Microbiology: Results for orders placed or performed during the hospital encounter of 06/10/15  Blood Culture (routine x 2)     Status: None   Collection Time: 06/10/15  2:35 PM  Result Value Ref Range Status   Specimen Description BLOOD RIGHT WRIST  Final   Special Requests BOTTLES DRAWN AEROBIC AND ANAEROBIC  [place]  Final   Culture NO GROWTH 5 DAYS  Final   Report Status 06/15/2015 FINAL  Final  Blood Culture (routine x 2)     Status: None   Collection Time: 06/10/15  2:40 PM  Result Value Ref Range Status   Specimen Description BLOOD LEFT ASSIST CONTROL  Final   Special Requests   Final    BOTTLES DRAWN AEROBIC AND ANAEROBIC  AER 6CC ANA 4CC   Culture NO GROWTH 5 DAYS  Final   Report Status 06/15/2015 FINAL  Final  Urine culture     Status: None   Collection Time: 06/10/15  4:42 PM  Result Value Ref Range Status   Specimen Description URINE, RANDOM  Final   Special Requests NONE  Final   Culture 4,000 COLONIES/mL INSIGNIFICANT GROWTH  Final    Report Status 06/12/2015 FINAL  Final  MRSA PCR Screening     Status: None   Collection Time: 06/10/15  6:27 PM  Result Value Ref Range Status   MRSA by PCR NEGATIVE NEGATIVE Final    Comment:        The GeneXpert MRSA Assay (FDA approved for NASAL specimens only), is one component of a comprehensive MRSA colonization surveillance program. It is not intended to diagnose MRSA infection nor to guide or monitor treatment for MRSA infections.   Culture, sputum-assessment     Status: None   Collection Time: 06/11/15  2:50 PM  Result Value Ref Range Status   Specimen Description SPUTUM  Final   Special Requests NONE  Final   Sputum evaluation THIS SPECIMEN IS ACCEPTABLE FOR SPUTUM CULTURE  Final   Report Status 06/13/2015 FINAL  Final  Culture, respiratory (NON-Expectorated)     Status: None   Collection Time: 06/11/15  2:50 PM  Result Value Ref Range Status   Specimen Description SPUTUM  Final   Special Requests NONE Reflexed from [id number]  Final   Gram Stain   Final    MODERATE WBC SEEN RARE GRAM POSITIVE COCCI GOOD SPECIMEN - 80-90% WBCS    Culture MODERATE GROWTH STAPHYLOCOCCUS AUREUS  Final   Report Status 06/14/2015 FINAL  Final   Organism ID, Bacteria STAPHYLOCOCCUS AUREUS  Final      Susceptibility   Staphylococcus aureus - MIC*    CIPROFLOXACIN <=0.5 SENSITIVE Sensitive     GENTAMICIN <=0.5 SENSITIVE Sensitive     OXACILLIN 0.5 SENSITIVE Sensitive     TRIMETH/SULFA <=10 SENSITIVE Sensitive     CEFOXITIN SCREEN NEGATIVE Sensitive     Inducible Clindamycin NEGATIVE Sensitive     TETRACYCLINE Value in next row Sensitive      SENSITIVE<=1    * MODERATE GROWTH STAPHYLOCOCCUS AUREUS  Wound culture     Status: None   Collection Time: 06/13/15  1:56 PM  Result Value Ref Range Status   Specimen Description  LEG  Final   Special Requests Normal  Final   Gram Stain   Final    FEW WBC SEEN MODERATE GRAM NEGATIVE RODS FEW GRAM POSITIVE COCCI    Culture   Final    LIGHT  GROWTH STAPHYLOCOCCUS AUREUS RARE GROWTH PROTEUS PENNERI RARE GROWTH ENTEROCOCCUS FAECALIS    Report Status 06/17/2015 FINAL  Final   Organism ID, Bacteria STAPHYLOCOCCUS AUREUS  Final   Organism ID, Bacteria PROTEUS PENNERI  Final   Organism ID, Bacteria ENTEROCOCCUS FAECALIS  Final      Susceptibility   Staphylococcus aureus - MIC*    CIPROFLOXACIN <=0.5 SENSITIVE Sensitive     GENTAMICIN <=0.5 SENSITIVE Sensitive     OXACILLIN 0.5 SENSITIVE Sensitive     TRIMETH/SULFA <=10 SENSITIVE Sensitive     CEFOXITIN SCREEN NEGATIVE Sensitive     Inducible Clindamycin NEGATIVE Sensitive     TETRACYCLINE Value in next row Sensitive      SENSITIVE<=1    * LIGHT GROWTH STAPHYLOCOCCUS AUREUS   Proteus penneri - MIC*    AMPICILLIN Value in next row Resistant      SENSITIVE<=1    CEFAZOLIN Value in next row Resistant      SENSITIVE<=1    CEFTRIAXONE Value in next row Sensitive      SENSITIVE<=1    CIPROFLOXACIN Value in next row Sensitive      SENSITIVE<=1    GENTAMICIN Value in next row Sensitive      SENSITIVE<=1    IMIPENEM Value in next row Sensitive      SENSITIVE<=1    NITROFURANTOIN Value in next row Resistant      SENSITIVE<=1    TRIMETH/SULFA Value in next row Sensitive      SENSITIVE<=1    PIP/TAZO Value in next row Sensitive      SENSITIVE<=4    * RARE GROWTH PROTEUS PENNERI   Enterococcus faecalis - MIC*    AMPICILLIN Value in next row Sensitive      SENSITIVE<=4    LINEZOLID Value in next row Sensitive      SENSITIVE<=4    * RARE GROWTH ENTEROCOCCUS FAECALIS  Wound culture     Status: None   Collection Time: 06/13/15  1:58 PM  Result Value Ref Range Status   Specimen Description LEG  Final   Special Requests Normal  Final   Gram Stain RARE WBC SEEN FEW GRAM NEGATIVE RODS   Final   Culture NO GROWTH 4 DAYS  Final   Report Status 06/17/2015 FINAL  Final    Coagulation Studies: No results for input(s): LABPROT, INR in the last 72 hours.  Urinalysis: No results  for input(s): COLORURINE, LABSPEC, PHURINE, GLUCOSEU, HGBUR, BILIRUBINUR, KETONESUR, PROTEINUR, UROBILINOGEN, NITRITE, LEUKOCYTESUR in the last 72 hours.  Invalid input(s): APPERANCEUR    Imaging: Dg Abd Portable 1v  06/22/2015   CLINICAL DATA:  Vomiting. History of CHF and pneumonia. Abdominal distention. Multi organ failure.  EXAM: PORTABLE ABDOMEN - 1 VIEW  COMPARISON:  06/18/2015  FINDINGS: Right femoral dialysis catheter is identified with tip in the projection of the lower IVC. There is marked gaseous distension of the stomach. Gas noted within small and large bowel loops.  IMPRESSION: 1. Marked gaseous distension of the stomach.   Electronically Signed   By: Kerby Moors M.D.   On: 06/22/2015 16:57     Medications:     . sodium chloride   Intravenous Once  . ampicillin-sulbactam (UNASYN) IV  3 g Intravenous Q6H  .  apixaban  5 mg Oral BID  . famotidine (PEPCID) IV  20 mg Intravenous Q24H  . famotidine  20 mg Oral Q48H  . feeding supplement  1 Container Oral TID WC  . ipratropium-albuterol  3 mL Nebulization Q6H  . lactulose  20 g Oral BID  . metoprolol  2.5 mg Intravenous 4 times per day  . metoprolol tartrate  25 mg Oral Q6H   ammonium lactate, anticoagulant sodium citrate, ipratropium-albuterol, LORazepam, ondansetron (ZOFRAN) IV, senna-docusate  Assessment/ Plan:  64 y.o. male with a PMHx of congestive heart failure ejection fraction 30-35%, prior pneumonia, lower extremity edema with cellulitis, atrial fibrillation who was admitted to [facility] on 06/10/2015 for evaluation of altered mental status and shortness of breath. Upon admission the patient underwent intubation with transition to a mechanical ventilation.  1. Acute renal failure/chronic kidney disease stage III with baseline creatinine 1.3/proteinuria. The patient most likely has acute renal failure secondary to altered cardiorenal hemodynamics. His ejection fraction is low at 30%. - remains off pressors - UOP  improving.   -  Electrolytes and Volume status are acceptable No acute indication for Dialysis at present   2. Acute respiratory failure. From  underlying heart failure/pulmonary edema.  - extubated now. - improving  3.anasarca from  Acute systolic heart failure. EF 30-35%. - improving    LOS: 13 Nyimah Shadduck 8/27/20169:23 AM

## 2015-06-23 NOTE — Progress Notes (Signed)
[facility] Progess Note     ASSESSMENAT/PLAN   64 yo white male admitted to ICU for acute  hypercapnic and hypoxic resp failure from acute CHF exacerbation with acute liver dysfunction/liver failure with acute renal failure with acute encephalopathy with mixed resp and metabolic acidosis with recurrent resp failure. Pt grew MSSA in sputum, suspect pneumonia.  Pt failed extubation and reintubated, extubated on 8/25. Palliative care following to address goals of care and expectations.   PULMONARY 1.Respiratory Failure-patient reintubated  due to worsening cardio-renal syndrome - extubated on 06/21/15 afternoon - Limit sedation -MSSA in sputum. Possible pneumonia, on antibiotics.  Enterococcus on resp cx, now on unasyn -continue Bronchodilator Therapy - cont with even I/O status -check abg prn  CARDIOVASCULAR ACUTE CHF, chronic atrial fibrillation with RVR, now controlled.  -History of systolic congestive heart failure with an ejection fraction of 35% -history of afib - started on IV amiodarone due to BP issues, now with stable Bp and transitioned to metoprolol - continue to have massive edema - receiving intermittent HD also - Afbit, controlled on Beta blocker  RENAL Renal Failure Anasarca -follow chem 7 -follow UO -continue Foley Catheter-assess need -HD per renal - continue to have generalized edema  -placed on intermittent albumin by nephro   GASTROINTESTINAL GI Prophylaxis - U/S of RUL with GB sludge but no fluid noted, no leukocytosis - hx of ? Liver dysfunction -->jaundice appearance, check LFTs today, could be due to his possible cardiorenal syndrome   HEMATOLOGIC Anemia without overt blood loss Thrombocytopenia - HIT panel pending On apixaban - monitor for bleeding  INFECTIOUS ?cellulitis of LE-wound culture positive for multiple organisms including E faecalis MSSA, and Proteus. Abx changed to PO due to nausea 8/26 Cont Unasyn    ENDOCRINE No issues   NEUROLOGIC - extubated on 8/25, awake and alert - limit - benzos and opiods     ----------------------------------------   Name: Aaron Harrison MRN: [id number] DOB: 1951/04/16    ADMISSION DATE:  06/10/2015  SUBJECTIVE:  No distress. No new complaints Palliative Care input noted. Pt in DNR/DNI. Continue HD as needed for now    VITAL SIGNS: Temp:  [97.7 F (36.5 C)-99.9 F (37.7 C)] 99.9 F (37.7 C) (08/27 1100) Pulse Rate:  [26-125] 106 (08/27 1109) Resp:  [19-37] 31 (08/27 1109) BP: (117-177)/(69-121) 144/89 mmHg (08/27 1100) SpO2:  [90 %-100 %] 100 % (08/27 1109) Weight:  [111.4 kg (245 lb 9.5 oz)] 111.4 kg (245 lb 9.5 oz) (08/27 0455) HEMODYNAMICS:   VENTILATOR SETTINGS:   INTAKE / OUTPUT:  Intake/Output Summary (Last 24 hours) at 06/23/15 1117 Last data filed at 06/23/15 0946  Gross per 24 hour  Intake    450 ml  Output   1325 ml  Net   -875 ml    PHYSICAL EXAMINATION: Physical Examination:   VS: BP 144/89 mmHg  Pulse 106  Temp(Src) 99.9 F (37.7 C) (Other (Comment))  Resp 31  Ht 5\' 8"  (1.727 m)  Wt 111.4 kg (245 lb 9.5 oz)  BMI 37.35 kg/m2  SpO2 100%  General Appearance: No distress, awake, mild jaundice appearance Neuro:without focal findings, mental status normal. HEENT: PERRLA, EOM intact. Pulmonary: coarse upper airway sounds, dec bs at the bases, with some fine crackles.  CardiovascularNormal S1,S2.  No m/r/g.   Abdomen: Benign, Soft, non-tender. GU:  Not performed at this time. Endocrine: No evident thyromegaly. Skin:   Warm , anterior LE B/L skin wounds (clean based, no purulent drainage).  Extremities:  normal, no cyanosis, clubbing.   LABS:  CBC  Recent Labs Lab 06/18/15 0408 06/20/15 0439 06/21/15 0507  WBC 5.1 8.4 8.3  HGB 10.9* 12.3* 11.3*  HCT 33.4* 37.0* 34.6*  PLT 34* 40* 34*   Coag's No results for input(s): APTT, INR in the last 168 hours. BMET  Recent Labs Lab 06/22/15 0820  06/22/15 2039 06/23/15 0502  NA 140 141 143  K 4.3 4.3 4.2  CL 105 104 106  CO2 27 27 27   BUN 33* 43* 47*  CREATININE 1.80* 2.50* 2.97*  GLUCOSE 87 95 88   Electrolytes  Recent Labs Lab 06/22/15 0820 06/22/15 2039 06/23/15 0502  CALCIUM 8.9 8.7* 8.6*  MG 1.9 1.8 1.9  PHOS 2.7 3.2 3.6   Sepsis Markers No results for input(s): LATICACIDVEN, PROCALCITON, O2SATVEN in the last 168 hours. ABG  Recent Labs Lab 06/18/15 0500 06/20/15 0424 06/21/15 1050  PHART 7.41 7.44 7.41  PCO2ART 44 38 43  PO2ART 76* 85 85   Liver Enzymes  Recent Labs Lab 06/18/15 0814  06/20/15 0439  06/22/15 0820 06/22/15 2039 06/23/15 0502  AST 30  --  23  --  40  --   --   ALT 23  --  19  --  28  --   --   ALKPHOS  --   --  53  --  47  --   --   BILITOT  --   --  3.3*  --  2.7*  --   --   ALBUMIN 2.3*  < > 3.5  < > 3.8  3.7 3.6 3.4*  < > = values in this interval not displayed. Cardiac Enzymes No results for input(s): TROPONINI, PROBNP in the last 168 hours. Glucose  Recent Labs Lab 06/22/15 0854 06/22/15 1143 06/22/15 1619 06/22/15 1959 06/23/15 0352 06/23/15 0723  GLUCAP 70 84 90 93 93 80     Dg Abd Portable 1v  06/22/2015   CLINICAL DATA:  Vomiting. History of CHF and pneumonia. Abdominal distention. Multi organ failure.  EXAM: PORTABLE ABDOMEN - 1 VIEW  COMPARISON:  06/18/2015  FINDINGS: Right femoral dialysis catheter is identified with tip in the projection of the lower IVC. There is marked gaseous distension of the stomach. Gas noted within small and large bowel loops.  IMPRESSION: 1. Marked gaseous distension of the stomach.   Electronically Signed   By: Kerby Moors M.D.   On: 06/22/2015 16:57     Merton Border, MD PCCM service Mobile [phone] Pager [phone]

## 2015-06-24 LAB — GLUCOSE, CAPILLARY
GLUCOSE-CAPILLARY: 111 mg/dL — AB (ref 65–99)
Glucose-Capillary: 78 mg/dL (ref 65–99)
Glucose-Capillary: 92 mg/dL (ref 65–99)

## 2015-06-24 LAB — RENAL FUNCTION PANEL
Albumin: 3.2 g/dL — ABNORMAL LOW (ref 3.5–5.0)
Anion gap: 8 (ref 5–15)
BUN: 57 mg/dL — AB (ref 6–20)
CHLORIDE: 105 mmol/L (ref 101–111)
CO2: 29 mmol/L (ref 22–32)
CREATININE: 3.87 mg/dL — AB (ref 0.61–1.24)
Calcium: 8.5 mg/dL — ABNORMAL LOW (ref 8.9–10.3)
GFR calc Af Amer: 18 mL/min — ABNORMAL LOW (ref >60)
GFR calc non Af Amer: 15 mL/min — ABNORMAL LOW (ref >60)
GLUCOSE: 102 mg/dL — AB (ref 65–99)
POTASSIUM: 3.9 mmol/L (ref 3.5–5.1)
Phosphorus: 5.2 mg/dL — ABNORMAL HIGH (ref 2.5–4.6)
Sodium: 142 mmol/L (ref 135–145)

## 2015-06-24 LAB — MAGNESIUM: Magnesium: 2 mg/dL (ref 1.7–2.4)

## 2015-06-24 MED ORDER — AMOXICILLIN-POT CLAVULANATE 500-125 MG PO TABS
1.0000 | ORAL_TABLET | Freq: Two times a day (BID) | ORAL | Status: DC
  Administered 2015-06-24 – 2015-06-25 (×2): 500 mg via ORAL
  Filled 2015-06-24 (×3): qty 1

## 2015-06-24 MED ORDER — METOPROLOL TARTRATE 50 MG PO TABS
50.0000 mg | ORAL_TABLET | Freq: Three times a day (TID) | ORAL | Status: DC
  Administered 2015-06-24 – 2015-07-06 (×34): 50 mg via ORAL
  Filled 2015-06-24 (×36): qty 1

## 2015-06-24 NOTE — Progress Notes (Signed)
Subjective:  CRRT d/c'd night of 8/25 UOP has improved after iv lasix.  10.65 mL last 24 hrs Abdomen is distended  Objective:  Vital signs in last 24 hours:  Temp:  [98.3 F (36.8 C)-99.9 F (37.7 C)] 98.8 F (37.1 C) (08/28 0400) Pulse Rate:  [66-111] 94 (08/28 0600) Resp:  [24-39] 31 (08/28 0600) BP: (113-160)/(70-96) 144/73 mmHg (08/28 0600) SpO2:  [87 %-100 %] 99 % (08/28 0600) Weight:  [108.1 kg (238 lb 5.1 oz)] 108.1 kg (238 lb 5.1 oz) (08/28 0500)  Weight change: -3.3 kg (-7 lb 4.4 oz) Filed Weights   06/22/15 0500 06/23/15 0455 06/24/15 0500  Weight: 112.5 kg (248 lb 0.3 oz) 111.4 kg (245 lb 9.5 oz) 108.1 kg (238 lb 5.1 oz)    Intake/Output: I/O last 3 completed shifts: In: 2354 [P.O.:2004; IV Piggyback:350] Out: 2015 [Urine:1465; Emesis/NG output:450; Stool:100]   Intake/Output this shift:     Physical Exam: General: ill appearing  Head: Normocephalic, atraumatic.  ENT Moist mucus membranes  Neck: Supple, trachea midline  Lungs:  Normal effort  Heart: Irregular S1S2 no rubs  Abdomen:  nontender, mild distension,  Extremities:  + generalized  Edema- improved  Neurologic:  resting comfortably   Skin: ulcertaion bilateral lower extremities on shins, ? jaundice   Access: Femoral dialysis catheter    Basic Metabolic Panel:  Recent Labs Lab 06/22/15 0125 06/22/15 0820 06/22/15 2039 06/23/15 0502 06/24/15 0505 06/24/15 0506  NA 141 140 141 143 142  --   K 4.3 4.3 4.3 4.2 3.9  --   CL 106 105 104 106 105  --   CO2 30 27 27 27 29   --   GLUCOSE 94 87 95 88 102*  --   BUN 30* 33* 43* 47* 57*  --   CREATININE 1.39* 1.80* 2.50* 2.97* 3.87*  --   CALCIUM 8.9 8.9 8.7* 8.6* 8.5*  --   MG 1.8 1.9 1.8 1.9  --  2.0  PHOS 2.6 2.7 3.2 3.6 5.2*  --     Liver Function Tests:  Recent Labs Lab 06/18/15 0814  06/20/15 0439  06/22/15 0125 06/22/15 0820 06/22/15 2039 06/23/15 0502 06/24/15 0505  AST 30  --  23  --   --  40  --   --   --   ALT 23  --  19   --   --  28  --   --   --   ALKPHOS  --   --  53  --   --  47  --   --   --   BILITOT  --   --  3.3*  --   --  2.7*  --   --   --   PROT  --   --  6.5  --   --  6.5  --   --   --   ALBUMIN 2.3*  < > 3.5  < > 3.8 3.8  3.7 3.6 3.4* 3.2*  < > = values in this interval not displayed. No results for input(s): LIPASE, AMYLASE in the last 168 hours.  Recent Labs Lab 06/18/15 1115 06/22/15 1109  AMMONIA 24 53*    CBC:  Recent Labs Lab 06/18/15 0408 06/20/15 0439 06/21/15 0507  WBC 5.1 8.4 8.3  NEUTROABS  --   --  7.1*  HGB 10.9* 12.3* 11.3*  HCT 33.4* 37.0* 34.6*  MCV 103.3* 102.3* 102.3*  PLT 34* 40* 34*    Cardiac Enzymes: No results  for input(s): CKTOTAL, CKMB, CKMBINDEX, TROPONINI in the last 168 hours.  BNP: Invalid input(s): POCBNP  CBG:  Recent Labs Lab 06/23/15 0352 06/23/15 0723 06/23/15 1115 06/23/15 1632 06/24/15 0717  GLUCAP 93 80 93 115* 78    Microbiology: Results for orders placed or performed during the hospital encounter of 06/10/15  Blood Culture (routine x 2)     Status: None   Collection Time: 06/10/15  2:35 PM  Result Value Ref Range Status   Specimen Description BLOOD RIGHT WRIST  Final   Special Requests BOTTLES DRAWN AEROBIC AND ANAEROBIC  [place]  Final   Culture NO GROWTH 5 DAYS  Final   Report Status 06/15/2015 FINAL  Final  Blood Culture (routine x 2)     Status: None   Collection Time: 06/10/15  2:40 PM  Result Value Ref Range Status   Specimen Description BLOOD LEFT ASSIST CONTROL  Final   Special Requests   Final    BOTTLES DRAWN AEROBIC AND ANAEROBIC  AER 6CC ANA 4CC   Culture NO GROWTH 5 DAYS  Final   Report Status 06/15/2015 FINAL  Final  Urine culture     Status: None   Collection Time: 06/10/15  4:42 PM  Result Value Ref Range Status   Specimen Description URINE, RANDOM  Final   Special Requests NONE  Final   Culture 4,000 COLONIES/mL INSIGNIFICANT GROWTH  Final   Report Status 06/12/2015 FINAL  Final  MRSA PCR Screening      Status: None   Collection Time: 06/10/15  6:27 PM  Result Value Ref Range Status   MRSA by PCR NEGATIVE NEGATIVE Final    Comment:        The GeneXpert MRSA Assay (FDA approved for NASAL specimens only), is one component of a comprehensive MRSA colonization surveillance program. It is not intended to diagnose MRSA infection nor to guide or monitor treatment for MRSA infections.   Culture, sputum-assessment     Status: None   Collection Time: 06/11/15  2:50 PM  Result Value Ref Range Status   Specimen Description SPUTUM  Final   Special Requests NONE  Final   Sputum evaluation THIS SPECIMEN IS ACCEPTABLE FOR SPUTUM CULTURE  Final   Report Status 06/13/2015 FINAL  Final  Culture, respiratory (NON-Expectorated)     Status: None   Collection Time: 06/11/15  2:50 PM  Result Value Ref Range Status   Specimen Description SPUTUM  Final   Special Requests NONE Reflexed from [id number]  Final   Gram Stain   Final    MODERATE WBC SEEN RARE GRAM POSITIVE COCCI GOOD SPECIMEN - 80-90% WBCS    Culture MODERATE GROWTH STAPHYLOCOCCUS AUREUS  Final   Report Status 06/14/2015 FINAL  Final   Organism ID, Bacteria STAPHYLOCOCCUS AUREUS  Final      Susceptibility   Staphylococcus aureus - MIC*    CIPROFLOXACIN <=0.5 SENSITIVE Sensitive     GENTAMICIN <=0.5 SENSITIVE Sensitive     OXACILLIN 0.5 SENSITIVE Sensitive     TRIMETH/SULFA <=10 SENSITIVE Sensitive     CEFOXITIN SCREEN NEGATIVE Sensitive     Inducible Clindamycin NEGATIVE Sensitive     TETRACYCLINE Value in next row Sensitive      SENSITIVE<=1    * MODERATE GROWTH STAPHYLOCOCCUS AUREUS  Wound culture     Status: None   Collection Time: 06/13/15  1:56 PM  Result Value Ref Range Status   Specimen Description LEG  Final   Special Requests Normal  Final  Gram Stain   Final    FEW WBC SEEN MODERATE GRAM NEGATIVE RODS FEW GRAM POSITIVE COCCI    Culture   Final    LIGHT GROWTH STAPHYLOCOCCUS AUREUS RARE GROWTH PROTEUS  PENNERI RARE GROWTH ENTEROCOCCUS FAECALIS    Report Status 06/17/2015 FINAL  Final   Organism ID, Bacteria STAPHYLOCOCCUS AUREUS  Final   Organism ID, Bacteria PROTEUS PENNERI  Final   Organism ID, Bacteria ENTEROCOCCUS FAECALIS  Final      Susceptibility   Staphylococcus aureus - MIC*    CIPROFLOXACIN <=0.5 SENSITIVE Sensitive     GENTAMICIN <=0.5 SENSITIVE Sensitive     OXACILLIN 0.5 SENSITIVE Sensitive     TRIMETH/SULFA <=10 SENSITIVE Sensitive     CEFOXITIN SCREEN NEGATIVE Sensitive     Inducible Clindamycin NEGATIVE Sensitive     TETRACYCLINE Value in next row Sensitive      SENSITIVE<=1    * LIGHT GROWTH STAPHYLOCOCCUS AUREUS   Proteus penneri - MIC*    AMPICILLIN Value in next row Resistant      SENSITIVE<=1    CEFAZOLIN Value in next row Resistant      SENSITIVE<=1    CEFTRIAXONE Value in next row Sensitive      SENSITIVE<=1    CIPROFLOXACIN Value in next row Sensitive      SENSITIVE<=1    GENTAMICIN Value in next row Sensitive      SENSITIVE<=1    IMIPENEM Value in next row Sensitive      SENSITIVE<=1    NITROFURANTOIN Value in next row Resistant      SENSITIVE<=1    TRIMETH/SULFA Value in next row Sensitive      SENSITIVE<=1    PIP/TAZO Value in next row Sensitive      SENSITIVE<=4    * RARE GROWTH PROTEUS PENNERI   Enterococcus faecalis - MIC*    AMPICILLIN Value in next row Sensitive      SENSITIVE<=4    LINEZOLID Value in next row Sensitive      SENSITIVE<=4    * RARE GROWTH ENTEROCOCCUS FAECALIS  Wound culture     Status: None   Collection Time: 06/13/15  1:58 PM  Result Value Ref Range Status   Specimen Description LEG  Final   Special Requests Normal  Final   Gram Stain RARE WBC SEEN FEW GRAM NEGATIVE RODS   Final   Culture NO GROWTH 4 DAYS  Final   Report Status 06/17/2015 FINAL  Final    Coagulation Studies: No results for input(s): LABPROT, INR in the last 72 hours.  Urinalysis: No results for input(s): COLORURINE, LABSPEC, PHURINE,  GLUCOSEU, HGBUR, BILIRUBINUR, KETONESUR, PROTEINUR, UROBILINOGEN, NITRITE, LEUKOCYTESUR in the last 72 hours.  Invalid input(s): APPERANCEUR    Imaging: Dg Abd Portable 1v  06/22/2015   CLINICAL DATA:  Vomiting. History of CHF and pneumonia. Abdominal distention. Multi organ failure.  EXAM: PORTABLE ABDOMEN - 1 VIEW  COMPARISON:  06/18/2015  FINDINGS: Right femoral dialysis catheter is identified with tip in the projection of the lower IVC. There is marked gaseous distension of the stomach. Gas noted within small and large bowel loops.  IMPRESSION: 1. Marked gaseous distension of the stomach.   Electronically Signed   By: Kerby Moors M.D.   On: 06/22/2015 16:57     Medications:     . sodium chloride   Intravenous Once  . amoxicillin-clavulanate  1 tablet Oral Q12H  . famotidine  20 mg Oral Q48H  . feeding supplement  1 Container Oral  TID WC  . lactulose  20 g Oral BID  . metoprolol  2.5 mg Intravenous 4 times per day  . metoprolol tartrate  25 mg Oral Q6H   ammonium lactate, anticoagulant sodium citrate, ipratropium-albuterol, LORazepam, ondansetron (ZOFRAN) IV, senna-docusate  Assessment/ Plan:  64 y.o. 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 Upon admission the patient underwent intubation with transition to a mechanical ventilation.  1. Acute renal failure/chronic kidney disease stage III with baseline creatinine 1.3/proteinuria. The patient most likely has acute renal failure secondary to altered cardiorenal hemodynamics. His ejection fraction is low at 30%. - remains off pressors - UOP improving, although S Cr is higher. -  Electrolytes and Volume status are acceptable No acute indication for Dialysis at present   2. Acute respiratory failure. From  underlying heart  failure/pulmonary edema.  - extubated now. - improving  3.anasarca from  Acute systolic heart failure. EF 30-35%. - improving    LOS: 14 Christohper Dube 8/28/20169:15 AM

## 2015-06-24 NOTE — Progress Notes (Signed)
[place] NOTE  Pharmacy Consult for Electrolytes/CRRT dose adjustment    Allergies  Allergen Reactions  . Penicillins Nausea And Vomiting    Patient Measurements: Height: 5\' 8"  (172.7 cm) Weight: 238 lb 5.1 oz (108.1 kg) IBW/kg (Calculated) : 68.4   Vital Signs: Temp: 98.8 F (37.1 C) (08/28 0400) Temp Source: Oral (08/28 0400) BP: 144/73 mmHg (08/28 0600) Pulse Rate: 94 (08/28 0600) Intake/Output from previous day: 08/27 0701 - 08/28 0700 In: 2154 [P.O.:2004; IV Piggyback:150] Out: 1140 [Urine:1065; Stool:75] Intake/Output from this shift: Total I/O In: 450 [P.O.:450] Out: 515 [Urine:515] Vent settings for last 24 hours:    Labs:  Recent Labs  06/22/15 0820 06/22/15 2039 06/23/15 0502 06/24/15 0505 06/24/15 0506  CREATININE 1.80* 2.50* 2.97* 3.87*  --   MG 1.9 1.8 1.9  --  2.0  PHOS 2.7 3.2 3.6 5.2*  --   ALBUMIN 3.8  3.7 3.6 3.4* 3.2*  --   PROT 6.5  --   --   --   --   AST 40  --   --   --   --   ALT 28  --   --   --   --   ALKPHOS 47  --   --   --   --   BILITOT 2.7*  --   --   --   --   BILIDIR 1.2*  --   --   --   --   IBILI 1.5*  --   --   --   --    Estimated Creatinine Clearance: 23.6 mL/min (by C-G formula based on Cr of 3.87).   Recent Labs  06/23/15 0723 06/23/15 1115 06/23/15 1632  GLUCAP 80 93 115*   BMP Latest Ref Rng 06/24/2015 06/23/2015 06/22/2015  Glucose 65 - 99 mg/dL 102(H) 88 95  BUN 6 - 20 mg/dL 57(H) 47(H) 43(H)  Creatinine 0.61 - 1.24 mg/dL 3.87(H) 2.97(H) 2.50(H)  Sodium 135 - 145 mmol/L 142 143 141  Potassium 3.5 - 5.1 mmol/L 3.9 4.2 4.3  Chloride 101 - 111 mmol/L 105 106 104  CO2 22 - 32 mmol/L 29 27 27   Calcium 8.9 - 10.3 mg/dL 8.5(L) 8.6(L) 8.7(L)     Microbiology: Recent Results (from the past 720 hour(s))  Blood Culture (routine x 2)     Status: None   Collection Time: 06/10/15  2:35 PM  Result Value Ref Range Status   Specimen Description BLOOD RIGHT WRIST  Final   Special  Requests BOTTLES DRAWN AEROBIC AND ANAEROBIC  1CC  Final   Culture NO GROWTH 5 DAYS  Final   Report Status 06/15/2015 FINAL  Final  Blood Culture (routine x 2)     Status: None   Collection Time: 06/10/15  2:40 PM  Result Value Ref Range Status   Specimen Description BLOOD LEFT ASSIST CONTROL  Final   Special Requests   Final    BOTTLES DRAWN AEROBIC AND ANAEROBIC  AER 6CC ANA 4CC   Culture NO GROWTH 5 DAYS  Final   Report Status 06/15/2015 FINAL  Final  Urine culture     Status: None   Collection Time: 06/10/15  4:42 PM  Result Value Ref Range Status   Specimen Description URINE, RANDOM  Final   Special Requests NONE  Final   Culture 4,000 COLONIES/mL INSIGNIFICANT GROWTH  Final   Report Status 06/12/2015 FINAL  Final  MRSA PCR Screening     Status: None  Collection Time: 06/10/15  6:27 PM  Result Value Ref Range Status   MRSA by PCR NEGATIVE NEGATIVE Final    Comment:        The GeneXpert MRSA Assay (FDA approved for NASAL specimens only), is one component of a comprehensive MRSA colonization surveillance program. It is not intended to diagnose MRSA infection nor to guide or monitor treatment for MRSA infections.   Culture, sputum-assessment     Status: None   Collection Time: 06/11/15  2:50 PM  Result Value Ref Range Status   Specimen Description SPUTUM  Final   Special Requests NONE  Final   Sputum evaluation THIS SPECIMEN IS ACCEPTABLE FOR SPUTUM CULTURE  Final   Report Status 06/13/2015 FINAL  Final  Culture, respiratory (NON-Expectorated)     Status: None   Collection Time: 06/11/15  2:50 PM  Result Value Ref Range Status   Specimen Description SPUTUM  Final   Special Requests NONE Reflexed from [id number]  Final   Gram Stain   Final    MODERATE WBC SEEN RARE GRAM POSITIVE COCCI GOOD SPECIMEN - 80-90% WBCS    Culture MODERATE GROWTH STAPHYLOCOCCUS AUREUS  Final   Report Status 06/14/2015 FINAL  Final   Organism ID, Bacteria STAPHYLOCOCCUS AUREUS  Final       Susceptibility   Staphylococcus aureus - MIC*    CIPROFLOXACIN <=0.5 SENSITIVE Sensitive     GENTAMICIN <=0.5 SENSITIVE Sensitive     OXACILLIN 0.5 SENSITIVE Sensitive     TRIMETH/SULFA <=10 SENSITIVE Sensitive     CEFOXITIN SCREEN NEGATIVE Sensitive     Inducible Clindamycin NEGATIVE Sensitive     TETRACYCLINE Value in next row Sensitive      SENSITIVE<=1    * MODERATE GROWTH STAPHYLOCOCCUS AUREUS  Wound culture     Status: None   Collection Time: 06/13/15  1:56 PM  Result Value Ref Range Status   Specimen Description LEG  Final   Special Requests Normal  Final   Gram Stain   Final    FEW WBC SEEN MODERATE GRAM NEGATIVE RODS FEW GRAM POSITIVE COCCI    Culture   Final    LIGHT GROWTH STAPHYLOCOCCUS AUREUS RARE GROWTH PROTEUS PENNERI RARE GROWTH ENTEROCOCCUS FAECALIS    Report Status 06/17/2015 FINAL  Final   Organism ID, Bacteria STAPHYLOCOCCUS AUREUS  Final   Organism ID, Bacteria PROTEUS PENNERI  Final   Organism ID, Bacteria ENTEROCOCCUS FAECALIS  Final      Susceptibility   Staphylococcus aureus - MIC*    CIPROFLOXACIN <=0.5 SENSITIVE Sensitive     GENTAMICIN <=0.5 SENSITIVE Sensitive     OXACILLIN 0.5 SENSITIVE Sensitive     TRIMETH/SULFA <=10 SENSITIVE Sensitive     CEFOXITIN SCREEN NEGATIVE Sensitive     Inducible Clindamycin NEGATIVE Sensitive     TETRACYCLINE Value in next row Sensitive      SENSITIVE<=1    * LIGHT GROWTH STAPHYLOCOCCUS AUREUS   Proteus penneri - MIC*    AMPICILLIN Value in next row Resistant      SENSITIVE<=1    CEFAZOLIN Value in next row Resistant      SENSITIVE<=1    CEFTRIAXONE Value in next row Sensitive      SENSITIVE<=1    CIPROFLOXACIN Value in next row Sensitive      SENSITIVE<=1    GENTAMICIN Value in next row Sensitive      SENSITIVE<=1    IMIPENEM Value in next row Sensitive      SENSITIVE<=1    NITROFURANTOIN  Value in next row Resistant      SENSITIVE<=1    TRIMETH/SULFA Value in next row Sensitive      SENSITIVE<=1     PIP/TAZO Value in next row Sensitive      SENSITIVE<=4    * RARE GROWTH PROTEUS PENNERI   Enterococcus faecalis - MIC*    AMPICILLIN Value in next row Sensitive      SENSITIVE<=4    LINEZOLID Value in next row Sensitive      SENSITIVE<=4    * RARE GROWTH ENTEROCOCCUS FAECALIS  Wound culture     Status: None   Collection Time: 06/13/15  1:58 PM  Result Value Ref Range Status   Specimen Description LEG  Final   Special Requests Normal  Final   Gram Stain RARE WBC SEEN FEW GRAM NEGATIVE RODS   Final   Culture NO GROWTH 4 DAYS  Final   Report Status 06/17/2015 FINAL  Final    Medications:  Scheduled:  . sodium chloride   Intravenous Once  . amoxicillin-clavulanate  1 tablet Oral Q12H  . famotidine (PEPCID) IV  20 mg Intravenous Q24H  . famotidine  20 mg Oral Q48H  . feeding supplement  1 Container Oral TID WC  . ipratropium-albuterol  3 mL Nebulization Q6H  . lactulose  20 g Oral BID  . metoprolol  2.5 mg Intravenous 4 times per day  . metoprolol tartrate  25 mg Oral Q6H    Assessment: 64 y/o M with acute respiratory failure now extubated. Patient off CRRT and may begin HD tomorrow per nephrology.   Plan:  Electrolytes are WNL. Will f/u AM labs.  8/28 Electrolytes WNL. Labs in AM.  Aaron Harrison S, RPH 06/24/2015,6:37 AM

## 2015-06-24 NOTE — Clinical Social Work Note (Signed)
Patient currently is off ventilator and now off CRRT. According to nephrology, currently, patient will not require dialysis. PT is working with patient and recommending rehab in a facility. Patient currently does not have insurance and more than likely would be an LOG (letter of guarantee). Patient's friend, Annye Asa [phone]), was made HPOA on Friday and paperwork is on patient's chart. Patient has intermittent confusion according to documentation. CSW to talk with patient and HPOA and then initiate bedsearch. FL2 and pasrr completed. FL2 is ready to be sent out for bedsearch.

## 2015-06-24 NOTE — Progress Notes (Signed)
    SUBJECTIVE:  No chest pain.  No SOB.     PHYSICAL EXAM Filed Vitals:   06/24/15 0900 06/24/15 1000 06/24/15 1100 06/24/15 1200  BP: 130/74 142/81 120/68   Pulse: 84 84 79   Temp:    98.7 F (37.1 C)  TempSrc:    Oral  Resp: 25 26 31    Height:      Weight:      SpO2: 100% 100% 98%    General:  No distress Lungs:  Clear Heart:  Irregular Abdomen:  Positive bowel sounds, no rebound no guarding Extremities:  Dependent edema moderately severe  LABS:  Results for orders placed or performed during the hospital encounter of 06/10/15 (from the past 24 hour(s))  Glucose, capillary     Status: Abnormal   Collection Time: 06/23/15  4:32 PM  Result Value Ref Range   Glucose-Capillary 115 (H) 65 - 99 mg/dL  Renal function panel     Status: Abnormal   Collection Time: 06/24/15  5:05 AM  Result Value Ref Range   Sodium 142 135 - 145 mmol/L   Potassium 3.9 3.5 - 5.1 mmol/L   Chloride 105 101 - 111 mmol/L   CO2 29 22 - 32 mmol/L   Glucose, Bld 102 (H) 65 - 99 mg/dL   BUN 57 (H) 6 - 20 mg/dL   Creatinine, Ser 3.87 (H) 0.61 - 1.24 mg/dL   Calcium 8.5 (L) 8.9 - 10.3 mg/dL   Phosphorus 5.2 (H) 2.5 - 4.6 mg/dL   Albumin 3.2 (L) 3.5 - 5.0 g/dL   GFR calc non Af Amer 15 (L) >60 mL/min   GFR calc Af Amer 18 (L) >60 mL/min   Anion gap 8 5 - 15  Magnesium     Status: None   Collection Time: 06/24/15  5:06 AM  Result Value Ref Range   Magnesium 2.0 1.7 - 2.4 mg/dL  Glucose, capillary     Status: None   Collection Time: 06/24/15  7:17 AM  Result Value Ref Range   Glucose-Capillary 78 65 - 99 mg/dL  Glucose, capillary     Status: Abnormal   Collection Time: 06/24/15 11:13 AM  Result Value Ref Range   Glucose-Capillary 111 (H) 65 - 99 mg/dL    Intake/Output Summary (Last 24 hours) at 06/24/15 1305 Last data filed at 06/24/15 0930  Gross per 24 hour  Intake   1694 ml  Output    715 ml  Net    979 ml     ASSESSMENT AND PLAN:  Acute on chronic systolic HF:   Creat continues  to rise.  Not a NOAC candidate.  I stopped Eliquis yesterday.  He would be high risk for bleeding with warfarin.      Atrial fib with RVR:   Rate is ok.  I will discontinue IV beta blocker.   I will increase the PO beta blocker  CKD:  Progressively elevated creatinine with net positive UO yesterday.  Nephrology following to decide on appropriateness or indication for dialysis going forward.    Aaron Harrison 06/24/2015 1:05 PM

## 2015-06-24 NOTE — Progress Notes (Signed)
[place] [facility] [place] NAME: Aaron Harrison    MR#:  [id number]  DATE OF BIRTH:  1951/05/26  SUBJECTIVE:   Feeling better this morning. Feels more energetic. Less respiratory distress. Still very weak.    Review of Systems  Constitutional: Negative for fever, chills and weight loss.  HENT: Negative for ear discharge, ear pain and nosebleeds.   Eyes: Negative for blurred vision, pain and discharge.  Respiratory: Negative for sputum production, shortness of breath, wheezing and stridor.   Cardiovascular: Negative for chest pain, palpitations, orthopnea and PND.  Gastrointestinal: Positive for vomiting. Negative for nausea, abdominal pain and diarrhea.  Genitourinary: Negative for urgency and frequency.  Musculoskeletal: Negative for back pain and joint pain.  Neurological: Positive for weakness. Negative for sensory change, speech change and focal weakness.  Psychiatric/Behavioral: Negative for depression and hallucinations. The patient is not nervous/anxious.    DRUG ALLERGIES:   Allergies  Allergen Reactions  . Penicillins Nausea And Vomiting    VITALS:  Blood pressure 115/61, pulse 92, temperature 98.7 F (37.1 C), temperature source Oral, resp. rate 30, height 5\' 8"  (1.727 m), weight 108.1 kg (238 lb 5.1 oz), SpO2 100 %.  PHYSICAL EXAMINATION:   Physical Exam  GENERAL:  64 y.o.-year-old patient lying in the bed with no acute distress. critically ill, weak appearing EYES: Pupils equal, round, reactive to light and accommodation. No scleral icterus. Extraocular muscles intact.  HEENT: Head atraumatic, normocephalic. Oropharynx and nasopharynx clear.  NECK:  Supple, no jugular venous distention. No thyroid enlargement, no tenderness.  LUNGS: Normal breath sounds bilaterally, no wheezing, rales, rhonchi. No use of accessory muscles of respiration. CARDIOVASCULAR: S1, S2 normal. No murmurs, rubs, or gallops.  ABDOMEN: Soft,  nontender, nondistended. Bowel sounds present. No organomegaly or mass.  -severe scrotal edema EXTREMITIES: bilateral trace edema b/l with skin changes and thickening NEUROLOGIC: Alert, oriented 4, moves all 4 extremities strength 4 out of 5 PSYCHIATRIC: Calm, oriented SKIN:  bilateral LE cellulitis and weeping skin ulcers over the tibial shin  LABORATORY PANEL:   CBC  Recent Labs Lab 06/21/15 0507  WBC 8.3  HGB 11.3*  HCT 34.6*  PLT 34*    Chemistries   Recent Labs Lab 06/22/15 0820  06/24/15 0505 06/24/15 0506  NA 140  < > 142  --   K 4.3  < > 3.9  --   CL 105  < > 105  --   CO2 27  < > 29  --   GLUCOSE 87  < > 102*  --   BUN 33*  < > 57*  --   CREATININE 1.80*  < > 3.87*  --   CALCIUM 8.9  < > 8.5*  --   MG 1.9  < >  --  2.0  AST 40  --   --   --   ALT 28  --   --   --   ALKPHOS 47  --   --   --   BILITOT 2.7*  --   --   --   < > = values in this interval not displayed.  Cardiac Enzymes No results for input(s): TROPONINI in the last 168 hours.  RADIOLOGY:  Dg Abd Portable 1v  06/22/2015   CLINICAL DATA:  Vomiting. History of CHF and pneumonia. Abdominal distention. Multi organ failure.  EXAM: PORTABLE ABDOMEN - 1 VIEW  COMPARISON:  06/18/2015  FINDINGS: Right femoral dialysis catheter is identified with tip  in the projection of the lower IVC. There is marked gaseous distension of the stomach. Gas noted within small and large bowel loops.  IMPRESSION: 1. Marked gaseous distension of the stomach.   Electronically Signed   By: Kerby Moors M.D.   On: 06/22/2015 16:57   ASSESSMENT AND PLAN:  64 y.o. male with a known history of A. fib, CHF came in with  * Acute respiratory failure with hypoxia:  - intubated in ED 8/14-8/15, Reintubated on 8/17- 8/25 - currently doing well on nasal canula  * Sepsis with multiorgan failure: Likely hospital-acquired pneumonia -BC negative -wbc stable -no fever  * MSSA and enterococcus PNA - on  IV Unasyn-->change to po  augmentin for 2-3 days  * Acute renal failure: Possible cardiorenal syndrome due to underlying CHF.  - Appreciate nephrology input  - temporary dialysis catheter placed in the right groin and CRRT started 8/17, now with right chest portacath - UOP improving, Cr still high, may need long term HD  * Acute on chronic systolic CHF  - Echo 5/92/92 ejection fraction 30% to 35% - CRRT with  UF on hold for now  * A. fib with RVR:  - continue BB, rate is controlled -Continue aspirin, not a candidate for anticoagulation - appreciate cardiology following  Agree with transfer to SDU status. Will need SNF vs LTAC.  Case discussed with Care Management/Social Worker. CODE STATUS: DNR DVT Prophylaxis: sq heparin  TOTAL Critical TIME TAKING CARE OF THIS PATIENT: 74minutes.  >50% time spent on counselling and coordination of care with RN, Dr Duwayne Heck, Cedar Roseman M.D on 06/24/2015 at 2:38 PM  Between 7am to 6pm - Pager - [phone]  After 6pm go to [email or web address] - password [email or web address] [facility]  [facility]  Office  [phone]  CC: Primary care physician; No PCP Per Patient

## 2015-06-24 NOTE — Progress Notes (Signed)
[facility] NOTE  Pharmacy Consult for Electrolytes/CRRT dose adjustment    Allergies  Allergen Reactions  . Penicillins Nausea And Vomiting    Patient Measurements: Height: 5\' 8"  (172.7 cm) Weight: 238 lb 5.1 oz (108.1 kg) IBW/kg (Calculated) : 68.4   Vital Signs: Temp: 98.8 F (37.1 C) (08/28 0400) Temp Source: Oral (08/28 0400) BP: 144/73 mmHg (08/28 0600) Pulse Rate: 94 (08/28 0600) Intake/Output from previous day: 08/27 0701 - 08/28 0700 In: 2154 [P.O.:2004; IV Piggyback:150] Out: 1140 [Urine:1065; Stool:75] Intake/Output from this shift:   Vent settings for last 24 hours:    Labs:  Recent Labs  06/22/15 0820 06/22/15 2039 06/23/15 0502 06/24/15 0505 06/24/15 0506  CREATININE 1.80* 2.50* 2.97* 3.87*  --   MG 1.9 1.8 1.9  --  2.0  PHOS 2.7 3.2 3.6 5.2*  --   ALBUMIN 3.8  3.7 3.6 3.4* 3.2*  --   PROT 6.5  --   --   --   --   AST 40  --   --   --   --   ALT 28  --   --   --   --   ALKPHOS 47  --   --   --   --   BILITOT 2.7*  --   --   --   --   BILIDIR 1.2*  --   --   --   --   IBILI 1.5*  --   --   --   --    Estimated Creatinine Clearance: 23.6 mL/min (by C-G formula based on Cr of 3.87).   Recent Labs  06/23/15 1632 06/24/15 0717 06/24/15 1113  GLUCAP 115* 78 111*   BMP Latest Ref Rng 06/24/2015 06/23/2015 06/22/2015  Glucose 65 - 99 mg/dL 102(H) 88 95  BUN 6 - 20 mg/dL 57(H) 47(H) 43(H)  Creatinine 0.61 - 1.24 mg/dL 3.87(H) 2.97(H) 2.50(H)  Sodium 135 - 145 mmol/L 142 143 141  Potassium 3.5 - 5.1 mmol/L 3.9 4.2 4.3  Chloride 101 - 111 mmol/L 105 106 104  CO2 22 - 32 mmol/L 29 27 27   Calcium 8.9 - 10.3 mg/dL 8.5(L) 8.6(L) 8.7(L)     Microbiology: Recent Results (from the past 720 hour(s))  Blood Culture (routine x 2)     Status: None   Collection Time: 06/10/15  2:35 PM  Result Value Ref Range Status   Specimen Description BLOOD RIGHT WRIST  Final   Special Requests BOTTLES DRAWN AEROBIC AND ANAEROBIC  1CC   Final   Culture NO GROWTH 5 DAYS  Final   Report Status 06/15/2015 FINAL  Final  Blood Culture (routine x 2)     Status: None   Collection Time: 06/10/15  2:40 PM  Result Value Ref Range Status   Specimen Description BLOOD LEFT ASSIST CONTROL  Final   Special Requests   Final    BOTTLES DRAWN AEROBIC AND ANAEROBIC  AER 6CC ANA 4CC   Culture NO GROWTH 5 DAYS  Final   Report Status 06/15/2015 FINAL  Final  Urine culture     Status: None   Collection Time: 06/10/15  4:42 PM  Result Value Ref Range Status   Specimen Description URINE, RANDOM  Final   Special Requests NONE  Final   Culture 4,000 COLONIES/mL INSIGNIFICANT GROWTH  Final   Report Status 06/12/2015 FINAL  Final  MRSA PCR Screening     Status: None   Collection Time: 06/10/15  6:27 PM  Result Value Ref Range Status   MRSA by PCR NEGATIVE NEGATIVE Final    Comment:        The GeneXpert MRSA Assay (FDA approved for NASAL specimens only), is one component of a comprehensive MRSA colonization surveillance program. It is not intended to diagnose MRSA infection nor to guide or monitor treatment for MRSA infections.   Culture, sputum-assessment     Status: None   Collection Time: 06/11/15  2:50 PM  Result Value Ref Range Status   Specimen Description SPUTUM  Final   Special Requests NONE  Final   Sputum evaluation THIS SPECIMEN IS ACCEPTABLE FOR SPUTUM CULTURE  Final   Report Status 06/13/2015 FINAL  Final  Culture, respiratory (NON-Expectorated)     Status: None   Collection Time: 06/11/15  2:50 PM  Result Value Ref Range Status   Specimen Description SPUTUM  Final   Special Requests NONE Reflexed from [id number]  Final   Gram Stain   Final    MODERATE WBC SEEN RARE GRAM POSITIVE COCCI GOOD SPECIMEN - 80-90% WBCS    Culture MODERATE GROWTH STAPHYLOCOCCUS AUREUS  Final   Report Status 06/14/2015 FINAL  Final   Organism ID, Bacteria STAPHYLOCOCCUS AUREUS  Final      Susceptibility   Staphylococcus aureus - MIC*     CIPROFLOXACIN <=0.5 SENSITIVE Sensitive     GENTAMICIN <=0.5 SENSITIVE Sensitive     OXACILLIN 0.5 SENSITIVE Sensitive     TRIMETH/SULFA <=10 SENSITIVE Sensitive     CEFOXITIN SCREEN NEGATIVE Sensitive     Inducible Clindamycin NEGATIVE Sensitive     TETRACYCLINE Value in next row Sensitive      SENSITIVE<=1    * MODERATE GROWTH STAPHYLOCOCCUS AUREUS  Wound culture     Status: None   Collection Time: 06/13/15  1:56 PM  Result Value Ref Range Status   Specimen Description LEG  Final   Special Requests Normal  Final   Gram Stain   Final    FEW WBC SEEN MODERATE GRAM NEGATIVE RODS FEW GRAM POSITIVE COCCI    Culture   Final    LIGHT GROWTH STAPHYLOCOCCUS AUREUS RARE GROWTH PROTEUS PENNERI RARE GROWTH ENTEROCOCCUS FAECALIS    Report Status 06/17/2015 FINAL  Final   Organism ID, Bacteria STAPHYLOCOCCUS AUREUS  Final   Organism ID, Bacteria PROTEUS PENNERI  Final   Organism ID, Bacteria ENTEROCOCCUS FAECALIS  Final      Susceptibility   Staphylococcus aureus - MIC*    CIPROFLOXACIN <=0.5 SENSITIVE Sensitive     GENTAMICIN <=0.5 SENSITIVE Sensitive     OXACILLIN 0.5 SENSITIVE Sensitive     TRIMETH/SULFA <=10 SENSITIVE Sensitive     CEFOXITIN SCREEN NEGATIVE Sensitive     Inducible Clindamycin NEGATIVE Sensitive     TETRACYCLINE Value in next row Sensitive      SENSITIVE<=1    * LIGHT GROWTH STAPHYLOCOCCUS AUREUS   Proteus penneri - MIC*    AMPICILLIN Value in next row Resistant      SENSITIVE<=1    CEFAZOLIN Value in next row Resistant      SENSITIVE<=1    CEFTRIAXONE Value in next row Sensitive      SENSITIVE<=1    CIPROFLOXACIN Value in next row Sensitive      SENSITIVE<=1    GENTAMICIN Value in next row Sensitive      SENSITIVE<=1    IMIPENEM Value in next row Sensitive      SENSITIVE<=1    NITROFURANTOIN Value in next row Resistant  SENSITIVE<=1    TRIMETH/SULFA Value in next row Sensitive      SENSITIVE<=1    PIP/TAZO Value in next row Sensitive       SENSITIVE<=4    * RARE GROWTH PROTEUS PENNERI   Enterococcus faecalis - MIC*    AMPICILLIN Value in next row Sensitive      SENSITIVE<=4    LINEZOLID Value in next row Sensitive      SENSITIVE<=4    * RARE GROWTH ENTEROCOCCUS FAECALIS  Wound culture     Status: None   Collection Time: 06/13/15  1:58 PM  Result Value Ref Range Status   Specimen Description LEG  Final   Special Requests Normal  Final   Gram Stain RARE WBC SEEN FEW GRAM NEGATIVE RODS   Final   Culture NO GROWTH 4 DAYS  Final   Report Status 06/17/2015 FINAL  Final    Medications:  Scheduled:  . sodium chloride   Intravenous Once  . amoxicillin-clavulanate  1 tablet Oral Q12H  . famotidine  20 mg Oral Q48H  . feeding supplement  1 Container Oral TID WC  . lactulose  20 g Oral BID  . metoprolol  2.5 mg Intravenous 4 times per day  . metoprolol tartrate  25 mg Oral Q6H    Assessment: 63 y/o M with acute respiratory failure now extubated. Patient off CRRT and may begin HD tomorrow per nephrology.   Plan:  8/28 Electrolytes WNL. Labs in AM.  Order for Augmentin 875-125mg  PO Q12H.  Transitioned to Augmentin 500-125mg  PO Q12H based on renal function as per consult.  Olivia Canter, [facility]  Clinical Pharmacist 06/24/2015,11:35 AM

## 2015-06-24 NOTE — Progress Notes (Addendum)
No distress No new complaints O2 sats high 90s on 2 lpm [place]  Filed Vitals:   06/24/15 0800 06/24/15 0900 06/24/15 1000 06/24/15 1100  BP: 115/65 130/74 142/81 120/68  Pulse: 68 84 84 79  Temp:      TempSrc:      Resp: 28 25 26 31   Height:      Weight:      SpO2: 100% 100% 100% 98%   HEENT WNL JVP not well visualized Bibasilar crackles, no wheezes IRIR, no M noted NABS, soft 2-3+ symmetric LE edema No focal deficits  BMP Latest Ref Rng 06/24/2015 06/23/2015 06/22/2015  Glucose 65 - 99 mg/dL 102(H) 88 95  BUN 6 - 20 mg/dL 57(H) 47(H) 43(H)  Creatinine 0.61 - 1.24 mg/dL 3.87(H) 2.97(H) 2.50(H)  Sodium 135 - 145 mmol/L 142 143 141  Potassium 3.5 - 5.1 mmol/L 3.9 4.2 4.3  Chloride 101 - 111 mmol/L 105 106 104  CO2 22 - 32 mmol/L 29 27 27   Calcium 8.9 - 10.3 mg/dL 8.5(L) 8.6(L) 8.7(L)    CBC Latest Ref Rng 06/21/2015 06/20/2015 06/18/2015  WBC 3.8 - 10.6 K/uL 8.3 8.4 5.1  Hemoglobin 13.0 - 18.0 g/dL 11.3(L) 12.3(L) 10.9(L)  Hematocrit 40.0 - 52.0 % 34.6(L) 37.0(L) 33.4(L)  Platelets 150 - 440 K/uL 34(L) 40(L) 34(L)    No new CXR  IMPRESSION: Acute hypoxic resp failure due to pulmonary edema - improved End stage cardiomyopathy CAF, rate well controlled AKI on CRI - suspect cardiorenal syndrome Severe thrombocytopenia - HIT panel pending Cellulitis - on augmentin   PLAN/REC: Cont supplemental O2 Agree with DNR/DNI status Cards managing cardiomyopathy, CAF Renal service following - pt indicates that he still wishes to undergo HD if necessary  I do not believe that he is a candidate for further CRRT Holding all anticoagulation.  Follow up HIT panel Primary service to decide on duration of abx I have changes to SDU status 8/28. If next 24 hrs are quiet, consider transfer to telemetry  Merton Border, MD PCCM service Mobile [phone] Pager [phone]

## 2015-06-25 ENCOUNTER — Other Ambulatory Visit: Payer: Self-pay | Admitting: Internal Medicine

## 2015-06-25 ENCOUNTER — Inpatient Hospital Stay: Payer: Medicaid Other

## 2015-06-25 DIAGNOSIS — R944 Abnormal results of kidney function studies: Secondary | ICD-10-CM

## 2015-06-25 LAB — CBC
HCT: 30.9 % — ABNORMAL LOW (ref 40.0–52.0)
Hemoglobin: 10 g/dL — ABNORMAL LOW (ref 13.0–18.0)
MCH: 33.6 pg (ref 26.0–34.0)
MCHC: 32.4 g/dL (ref 32.0–36.0)
MCV: 103.6 fL — AB (ref 80.0–100.0)
PLATELETS: 91 10*3/uL — AB (ref 150–440)
RBC: 2.98 MIL/uL — AB (ref 4.40–5.90)
RDW: 16.6 % — AB (ref 11.5–14.5)
WBC: 9.2 10*3/uL (ref 3.8–10.6)

## 2015-06-25 LAB — BASIC METABOLIC PANEL
ANION GAP: 9 (ref 5–15)
BUN: 62 mg/dL — ABNORMAL HIGH (ref 6–20)
CO2: 28 mmol/L (ref 22–32)
Calcium: 7.8 mg/dL — ABNORMAL LOW (ref 8.9–10.3)
Chloride: 102 mmol/L (ref 101–111)
Creatinine, Ser: 4.62 mg/dL — ABNORMAL HIGH (ref 0.61–1.24)
GFR calc Af Amer: 14 mL/min — ABNORMAL LOW (ref >60)
GFR calc non Af Amer: 12 mL/min — ABNORMAL LOW (ref >60)
GLUCOSE: 101 mg/dL — AB (ref 65–99)
POTASSIUM: 3.7 mmol/L (ref 3.5–5.1)
Sodium: 139 mmol/L (ref 135–145)

## 2015-06-25 LAB — MAGNESIUM: MAGNESIUM: 1.8 mg/dL (ref 1.7–2.4)

## 2015-06-25 LAB — GLUCOSE, CAPILLARY: GLUCOSE-CAPILLARY: 89 mg/dL (ref 65–99)

## 2015-06-25 MED ORDER — SODIUM CHLORIDE 0.9 % IV SOLN
1.0000 g | Freq: Once | INTRAVENOUS | Status: AC
  Administered 2015-06-25: 1 g via INTRAVENOUS
  Filled 2015-06-25: qty 10

## 2015-06-25 NOTE — Clinical Social Work Placement (Signed)
   CLINICAL SOCIAL WORK PLACEMENT  NOTE  Date:  06/25/2015  Patient Details  Name: Aaron Harrison MRN: [id number] Date of Birth: 1951/06/27  Clinical Social Work is seeking post-discharge placement for this patient at the [place] level of care (*CSW will initial, date and re-position this form in  chart as items are completed):  Yes   Patient/family provided with [facility] Work Department's list of facilities offering this level of care within the geographic area requested by the patient (or if unable, by the patient's family).  Yes   Patient/family informed of their freedom to choose among providers that offer the needed level of care, that participate in Medicare, Medicaid or managed care program needed by the patient, have an available bed and are willing to accept the patient.  Yes   Patient/family informed of [facility]'s ownership interest in [facility] and [facility], as well as of the fact that they are under no obligation to receive care at these facilities.  PASRR submitted to EDS on 06/12/15     PASRR number received on 06/12/15     Existing PASRR number confirmed on       FL2 transmitted to all facilities in geographic area requested by pt/family on 06/25/15     FL2 transmitted to all facilities within larger geographic area on       Patient informed that his/her managed care company has contracts with or will negotiate with certain facilities, including the following:            Patient/family informed of bed offers received.  Patient chooses bed at       Physician recommends and patient chooses bed at      Patient to be transferred to   on  .  Patient to be transferred to facility by       Patient family notified on   of transfer.  Name of family member notified:        PHYSICIAN       Additional Comment:    _______________________________________________ Naida Sleight, LCSW 06/25/2015, 11:52 AM

## 2015-06-25 NOTE — Care Management Note (Signed)
Case Management Note  Patient Details  Name: DONNEL VENUTO MRN: [id number] Date of Birth: Sep 08, 1951  Subjective/Objective:  Chart reviewed.  CSW has initiated bed search for skilled nursing.  Will provide assistance as needed.                 Action/Plan:   Expected Discharge Date:                  Expected Discharge Plan:  Skilled Nursing Facility  In-House Referral:  Clinical Social Work  Discharge planning Services     Post Acute Care Choice:    Choice offered to:     DME Arranged:    DME Agency:     HH Arranged:    [place] Agency:     Status of Service:  In process, will continue to follow  Medicare Important Message Given:    Date Medicare IM Given:    Medicare IM give by:    Date Additional Medicare IM Given:    Additional Medicare Important Message give by:     If discussed at [place] of Stay Meetings, dates discussed:    Additional Comments:  Jolly Mango, RN 06/25/2015, 9:36 AM

## 2015-06-25 NOTE — Progress Notes (Signed)
SUBJECTIVE:  No chest pain.  No SOB.  He is alert and looks much better overall.    PHYSICAL EXAM Filed Vitals:   06/25/15 0500 06/25/15 0600 06/25/15 0700 06/25/15 0730  BP: 117/65 115/84 105/60   Pulse: 69 78 92   Temp:    98.5 F (36.9 C)  TempSrc:    Oral  Resp: 30 34 20   Height:      Weight:      SpO2: 100% 97% 100%    General:  No distress Lungs:  Clear Heart:  Irregular Abdomen:  Positive bowel sounds, no rebound no guarding Extremities:  Dependent edema moderately severe  LABS:  Results for orders placed or performed during the hospital encounter of 06/10/15 (from the past 24 hour(s))  Glucose, capillary     Status: Abnormal   Collection Time: 06/24/15 11:13 AM  Result Value Ref Range   Glucose-Capillary 111 (H) 65 - 99 mg/dL  Glucose, capillary     Status: None   Collection Time: 06/24/15  4:22 PM  Result Value Ref Range   Glucose-Capillary 92 65 - 99 mg/dL  Magnesium     Status: None   Collection Time: 06/25/15  5:33 AM  Result Value Ref Range   Magnesium 1.8 1.7 - 2.4 mg/dL  Basic metabolic panel     Status: Abnormal   Collection Time: 06/25/15  5:33 AM  Result Value Ref Range   Sodium 139 135 - 145 mmol/L   Potassium 3.7 3.5 - 5.1 mmol/L   Chloride 102 101 - 111 mmol/L   CO2 28 22 - 32 mmol/L   Glucose, Bld 101 (H) 65 - 99 mg/dL   BUN 62 (H) 6 - 20 mg/dL   Creatinine, Ser 4.62 (H) 0.61 - 1.24 mg/dL   Calcium 7.8 (L) 8.9 - 10.3 mg/dL   GFR calc non Af Amer 12 (L) >60 mL/min   GFR calc Af Amer 14 (L) >60 mL/min   Anion gap 9 5 - 15  CBC     Status: Abnormal   Collection Time: 06/25/15  5:33 AM  Result Value Ref Range   WBC 9.2 3.8 - 10.6 K/uL   RBC 2.98 (L) 4.40 - 5.90 MIL/uL   Hemoglobin 10.0 (L) 13.0 - 18.0 g/dL   HCT 30.9 (L) 40.0 - 52.0 %   MCV 103.6 (H) 80.0 - 100.0 fL   MCH 33.6 26.0 - 34.0 pg   MCHC 32.4 32.0 - 36.0 g/dL   RDW 16.6 (H) 11.5 - 14.5 %   Platelets 91 (L) 150 - 440 K/uL    Intake/Output Summary (Last 24 hours) at  06/25/15 0842 Last data filed at 06/25/15 0816  Gross per 24 hour  Intake   2311 ml  Output   1080 ml  Net   1231 ml     ASSESSMENT AND PLAN:  Acute on chronic systolic HF:   Creat continues to rise.     Continue treatment with metoprolol. No Ace inhibitor or ARB at the present time due to acute on chronic renal failure. Blood pressure is too low to allow the addition of hydralazine/Imdur.  Atrial fib with RVR:   Ventricular rate seems to be reasonably controlled on current dose of metoprolol 50 mg 3 times daily. He is not a NOAC candidate at the present time due to acute on chronic renal failure. He is also high risk for bleeding overall with any form of anticoagulation. This might change in the future if  his overall condition improves. We can evaluate candidacy for anticoagulation in the outpatient setting.  CKD:  Progressively elevated creatinine with net positive UO yesterday.  Nephrology following to decide on appropriateness or indication for dialysis going forward.    Aaron Harrison 06/25/2015 8:42 AM

## 2015-06-25 NOTE — Clinical Social Work Note (Signed)
CSW met with pt and talked to [facility] by phone to discuss the recommendation for STR.  Pt stated he does not want to go to STR but rather prefers to go home.  He stated his "son" [place]illerm[place] lives next door.  CSW explained that searching for a bed and having one available whether pt decides to go or not would be a better option.  Pt agreed that it is better to have options and agreed that CSW could initiate a SNF search.  CSW explained to pt that she would call pt's HCPOA and discuss discharge planning options.  Pt was in agreement.  Pt stated he wants to make his own decisions regarding his discharge plan.    CSW called pt's HCPOA and shared pt's desire to discharge home.  HCPOA explained that he is not certain that pt fully understands his needs.  HCPOA explained that pt is experiencing financial difficulties.  He explained that pt's housing and utility payments are behind and pt cannot afford STR.  CSW explained that pt may qualify for a LOG in order to receive STR.    CSW initiated bed search and will extend bed offers once received.  CSW will continue to follow and assist with d/c planning needs.  [place], [place]

## 2015-06-25 NOTE — Progress Notes (Signed)
Physical Therapy Treatment Patient Details Name: Aaron Harrison MRN: [id number] DOB: 1951-05-13 Today's Date: 06/25/2015    History of Present Illness presented to ER secondary to weakness, AMS; admitted (and subsequently intubated) for acute hypercarbic/hypoxic respiratory failure secondary to CHF exacerbation.  Hospital course also significant for liver dysfunction, acute encephalopathy and renal failure (requiring CRRT, ended 8/25; now with R temp fem cath); successfully extubated 8/25, currently on 1L O2 via .    PT Comments    Patient with difficulty attending to task this date; question some degree of confusion.  Bilat UE/LE strength appears functional for supine activities (except R LE NT secondary to R temp-fem cath).  Prefers to rest with head in position of R rotation/lateral flexion; encouraged for neutral positioning and movement towards L to maintain position/flexibility. Will continue to progress mobility as medically appropriate (pending removal of R temp fem cath); RN informed/aware, to discuss with team in rounding.   Follow Up Recommendations  SNF     Equipment Recommendations       Recommendations for Other Services       Precautions / Restrictions Precautions Precautions: Fall Precaution Comments: R temp fem cath Restrictions Weight Bearing Restrictions: No    Mobility  Bed Mobility               General bed mobility comments: bedrest secondary to R temp fem cath  Transfers                    Ambulation/Gait                 Stairs            Wheelchair Mobility    Modified Rankin (Stroke Patients Only)       Balance                                    Cognition                            Exercises Other Exercises Other Exercises: Bilat LE ankle pumps, quad sets, 1x15; L LE SAQs, heel slides, hip abduct/adduct and SLR, 1x15.  Additional R LE therex deferred secondary to R temp fem  cath. Other Exercises: Bilat UE shoulder flex (shoulder height only)/ext, horiz abduct/adduct, rowing, 1x15 Other Exercises: Cervical rotation, lateral flexion (emphasis on movement towards L), 1x15.  Prefers to rest in position or R rotation with lateral flexion; will be high risk for torticollis if does not self-correct.  Educated in [place] and importance of cervical movement/neutral alignment; question full comprehension.    General Comments        Pertinent Vitals/Pain Pain Assessment: No/denies pain    Home Living                      Prior Function            PT Goals (current goals can now be found in the care plan section) Acute Rehab PT Goals Patient Stated Goal: to get stronger PT Goal Formulation: With patient Time For Goal Achievement: 07/07/15 Potential to Achieve Goals: Fair Progress towards PT goals: Progressing toward goals    Frequency  Min 2X/week    PT Plan Current plan remains appropriate    Co-evaluation             End of Session  Activity Tolerance:  (additional mobility progression limited by presence of R temp fem cath) Patient left: with bed alarm set     Time: 2409-7353 PT Time Calculation (min) (ACUTE ONLY): 15 min  Charges:  $Therapeutic Exercise: 8-22 mins                    G Codes:      Zandra Lajeunesse H. Owens Shark, PT, DPT, NCS 06/25/2015, 11:22 AM [phone]

## 2015-06-25 NOTE — Progress Notes (Signed)
CC: follow up resp failure  No acute  Distress today No new complaints O2 sats high 90s on 2 lpm [place], tolerating oral intake Ok to transfer to gen med floor today  Filed Vitals:   06/25/15 0500 06/25/15 0600 06/25/15 0700 06/25/15 0730  BP: 117/65 115/84 105/60   Pulse: 69 78 92   Temp:    98.5 F (36.9 C)  TempSrc:    Oral  Resp: 30 34 20   Height:      Weight:      SpO2: 100% 97% 100%    HEENT WNL JVP not well visualized Bibasilar crackles, no wheezes IRIR, no M noted NABS, soft 2-3+ symmetric LE edema No focal deficits  BMP Latest Ref Rng 06/25/2015 06/24/2015 06/23/2015  Glucose 65 - 99 mg/dL 101(H) 102(H) 88  BUN 6 - 20 mg/dL 62(H) 57(H) 47(H)  Creatinine 0.61 - 1.24 mg/dL 4.62(H) 3.87(H) 2.97(H)  Sodium 135 - 145 mmol/L 139 142 143  Potassium 3.5 - 5.1 mmol/L 3.7 3.9 4.2  Chloride 101 - 111 mmol/L 102 105 106  CO2 22 - 32 mmol/L 28 29 27   Calcium 8.9 - 10.3 mg/dL 7.8(L) 8.5(L) 8.6(L)    CBC Latest Ref Rng 06/25/2015 06/21/2015 06/20/2015  WBC 3.8 - 10.6 K/uL 9.2 8.3 8.4  Hemoglobin 13.0 - 18.0 g/dL 10.0(L) 11.3(L) 12.3(L)  Hematocrit 40.0 - 52.0 % 30.9(L) 34.6(L) 37.0(L)  Platelets 150 - 440 K/uL 91(L) 34(L) 40(L)      IMPRESSION: Acute hypoxic resp failure due to pulmonary edema - improved End stage cardiomyopathy CAF, rate well controlled AKI on CRI - suspect cardiorenal syndrome Severe thrombocytopenia - HIT panel pending Cellulitis - on augmentin   PLAN/REC: 1.Cont supplemental O2 as needed 2.Agree with DNR/DNI status 3.Cards managing cardiomyopathy, CAF 4.Renal service following - pt indicates that he still wishes to undergo HD if necessary  I do not believe that he is a candidate for further CRRT 5.Holding all anticoagulation.  6.Follow up HIT panel  Ok to transfer to gen med floor  I have personally obtained a history, examined the patient, evaluated Pertinent laboratory and RadioGraphic/imaging results, and  formulated the assessment and  plan   The Patient requires high complexity decision making for assessment and support, frequent evaluation and titration of therapies. Time Spent with patient 30 mins   Shametra Cumberland Patricia Pesa, M.D.  [facility]  Medical Director [place] Director [facility] Cardio-Pulmonary Department

## 2015-06-25 NOTE — Progress Notes (Signed)
[place] [facility] [place] NAME: Aaron Harrison    MR#:  [id number]  DATE OF BIRTH:  December 22, 1950  SUBJECTIVE:   Breathing well, no distress. Still very weak. Decreased appetite.    Review of Systems  Constitutional: Negative for fever, chills and weight loss.  HENT: Negative for ear discharge, ear pain and nosebleeds.   Eyes: Negative for blurred vision, pain and discharge.  Respiratory: Negative for sputum production, shortness of breath, wheezing and stridor.   Cardiovascular: Negative for chest pain, palpitations, orthopnea and PND.  Gastrointestinal: Positive for vomiting. Negative for nausea, abdominal pain and diarrhea.  Genitourinary: Negative for urgency and frequency.  Musculoskeletal: Negative for back pain and joint pain.  Neurological: Positive for weakness. Negative for sensory change, speech change and focal weakness.  Psychiatric/Behavioral: Negative for depression and hallucinations. The patient is not nervous/anxious.    DRUG ALLERGIES:   Allergies  Allergen Reactions  . Penicillins Nausea And Vomiting    VITALS:  Blood pressure 135/73, pulse 73, temperature 98.5 F (36.9 C), temperature source Oral, resp. rate 34, height 5\' 8"  (1.727 m), weight 110.9 kg (244 lb 7.8 oz), SpO2 94 %.  PHYSICAL EXAMINATION:   Physical Exam  GENERAL:  64 y.o.-year-old patient lying in the bed with no acute distress. critically ill, weak appearing EYES: Pupils equal, round, reactive to light and accommodation. No scleral icterus. Extraocular muscles intact.  HEENT: Head atraumatic, normocephalic. Oropharynx and nasopharynx clear.  NECK:  Supple, no jugular venous distention. No thyroid enlargement, no tenderness.  LUNGS: Normal breath sounds bilaterally, no wheezing, rales, rhonchi. No use of accessory muscles of respiration. CARDIOVASCULAR: S1, S2 normal. No murmurs, rubs, or gallops.  ABDOMEN: Soft, nontender, nondistended. Bowel  sounds present. No organomegaly or mass.  -severe scrotal edema EXTREMITIES: bilateral trace edema b/l with skin changes and thickening NEUROLOGIC: Alert, oriented 4, moves all 4 extremities strength 4 out of 5 PSYCHIATRIC: Calm, oriented SKIN:  Chronic venous stasis changes BLE's  LABORATORY PANEL:   CBC  Recent Labs Lab 06/25/15 0533  WBC 9.2  HGB 10.0*  HCT 30.9*  PLT 91*    Chemistries   Recent Labs Lab 06/22/15 0820  06/25/15 0533  NA 140  < > 139  K 4.3  < > 3.7  CL 105  < > 102  CO2 27  < > 28  GLUCOSE 87  < > 101*  BUN 33*  < > 62*  CREATININE 1.80*  < > 4.62*  CALCIUM 8.9  < > 7.8*  MG 1.9  < > 1.8  AST 40  --   --   ALT 28  --   --   ALKPHOS 47  --   --   BILITOT 2.7*  --   --   < > = values in this interval not displayed.  Cardiac Enzymes No results for input(s): TROPONINI in the last 168 hours.  RADIOLOGY:  Dg Chest Port 1 View  06/25/2015   CLINICAL DATA:  Respiratory failure  EXAM: PORTABLE CHEST - 1 VIEW  COMPARISON:  June 20, 2015  FINDINGS: The heart size and mediastinal contours are stable. The heart size is enlarged. Previously noted endotracheal tube and nasogastric tube have been removed. Left subclavian central venous line is unchanged. There is central pulmonary vascular congestion. There is no focal pneumonia or pleural effusion. The visualized skeletal structures are stable.  IMPRESSION: Interval removal of endotracheal tube and nasogastric tube. Mild central pulmonary vascular congestion.  Electronically Signed   By: Abelardo Diesel M.D.   On: 06/25/2015 07:24   ASSESSMENT AND PLAN:  64 y.o. male with a known history of A. fib, CHF came in with  * Acute respiratory failure with hypoxia:  - intubated in ED 8/14-8/15, Reintubated on 8/17- 8/25 - currently doing well on nasal canula - CXR today with mild central venous congestion  * Sepsis with multiorgan failure: Likely hospital-acquired pneumonia. Improving. -BC negative -wbc  stable -no fever  * MSSA and enterococcus PNA - on  IV Unasyn-->change to po augmentin for 2-3 days, stop today  * Acute renal failure: Possible cardiorenal syndrome due to underlying CHF.  - Appreciate nephrology input  - temporary dialysis catheter placed in the right groin and CRRT 8/17- 8/25 - UOP improving, Cr continues to rise, would like to change access site for HD as it seems he will need it and current femoral access is preventing PT progress. Will discuss with Nephrology today.  * Acute on chronic systolic CHF : improving - Echo 06/11/15 ejection fraction 30% to 35% - CRRT with  UF on hold for now  * A. fib with RVR:  - continue BB, rate is controlled -Continue aspirin, not a candidate for anticoagulation - appreciate cardiology following  Agree with transfer to SDU status. Will need SNF vs LTAC.  Case discussed with Care Management/Social Worker. CODE STATUS: DNR DVT Prophylaxis: sq heparin  TOTAL Critical TIME TAKING CARE OF THIS PATIENT: 16minutes.  >50% time spent on counselling and coordination of care with RN, Dr Duwayne Heck, Keia Rask M.D on 06/25/2015 at 1:29 PM  Between 7am to 6pm - Pager - [phone]  After 6pm go to [email or web address] - password [email or web address] [facility]  [facility]  Office  [phone]  CC: Primary care physician; No PCP Per Patient

## 2015-06-25 NOTE — Progress Notes (Signed)
[facility] NOTE  Pharmacy Consult for Electrolytes/CRRT dose adjustment    Allergies  Allergen Reactions  . Penicillins Nausea And Vomiting    Patient Measurements: Height: 5\' 8"  (172.7 cm) Weight: 244 lb 7.8 oz (110.9 kg) IBW/kg (Calculated) : 68.4   Vital Signs: Temp: 99.3 F (37.4 C) (08/28 2000) Temp Source: Oral (08/28 2000) BP: 115/63 mmHg (08/29 0400) Pulse Rate: 76 (08/29 0400) Intake/Output from previous day: 08/28 0701 - 08/29 0700 In: 2211 [P.O.:2211] Out: 1080 [Urine:510] Intake/Output from this shift: Total I/O In: 417 [P.O.:417] Out: 60 [Urine:60] Vent settings for last 24 hours:    Labs:  Recent Labs  06/22/15 0820 06/22/15 2039 06/23/15 0502 06/24/15 0505 06/24/15 0506 06/25/15 0533  WBC  --   --   --   --   --  9.2  HGB  --   --   --   --   --  10.0*  HCT  --   --   --   --   --  30.9*  PLT  --   --   --   --   --  91*  CREATININE 1.80* 2.50* 2.97* 3.87*  --  4.62*  MG 1.9 1.8 1.9  --  2.0 1.8  PHOS 2.7 3.2 3.6 5.2*  --   --   ALBUMIN 3.8  3.7 3.6 3.4* 3.2*  --   --   PROT 6.5  --   --   --   --   --   AST 40  --   --   --   --   --   ALT 28  --   --   --   --   --   ALKPHOS 47  --   --   --   --   --   BILITOT 2.7*  --   --   --   --   --   BILIDIR 1.2*  --   --   --   --   --   IBILI 1.5*  --   --   --   --   --    Estimated Creatinine Clearance: 20 mL/min (by C-G formula based on Cr of 4.62).   Recent Labs  06/24/15 0717 06/24/15 1113 06/24/15 1622  GLUCAP 78 111* 92   BMP Latest Ref Rng 06/25/2015 06/24/2015 06/23/2015  Glucose 65 - 99 mg/dL 101(H) 102(H) 88  BUN 6 - 20 mg/dL 62(H) 57(H) 47(H)  Creatinine 0.61 - 1.24 mg/dL 4.62(H) 3.87(H) 2.97(H)  Sodium 135 - 145 mmol/L 139 142 143  Potassium 3.5 - 5.1 mmol/L 3.7 3.9 4.2  Chloride 101 - 111 mmol/L 102 105 106  CO2 22 - 32 mmol/L 28 29 27   Calcium 8.9 - 10.3 mg/dL 7.8(L) 8.5(L) 8.6(L)     Microbiology: Recent Results (from the past 720 hour(s))   Blood Culture (routine x 2)     Status: None   Collection Time: 06/10/15  2:35 PM  Result Value Ref Range Status   Specimen Description BLOOD RIGHT WRIST  Final   Special Requests BOTTLES DRAWN AEROBIC AND ANAEROBIC  1CC  Final   Culture NO GROWTH 5 DAYS  Final   Report Status 06/15/2015 FINAL  Final  Blood Culture (routine x 2)     Status: None   Collection Time: 06/10/15  2:40 PM  Result Value Ref Range Status   Specimen Description BLOOD LEFT ASSIST CONTROL  Final   Special Requests  Final    BOTTLES DRAWN AEROBIC AND ANAEROBIC  AER 6CC ANA 4CC   Culture NO GROWTH 5 DAYS  Final   Report Status 06/15/2015 FINAL  Final  Urine culture     Status: None   Collection Time: 06/10/15  4:42 PM  Result Value Ref Range Status   Specimen Description URINE, RANDOM  Final   Special Requests NONE  Final   Culture 4,000 COLONIES/mL INSIGNIFICANT GROWTH  Final   Report Status 06/12/2015 FINAL  Final  MRSA PCR Screening     Status: None   Collection Time: 06/10/15  6:27 PM  Result Value Ref Range Status   MRSA by PCR NEGATIVE NEGATIVE Final    Comment:        The GeneXpert MRSA Assay (FDA approved for NASAL specimens only), is one component of a comprehensive MRSA colonization surveillance program. It is not intended to diagnose MRSA infection nor to guide or monitor treatment for MRSA infections.   Culture, sputum-assessment     Status: None   Collection Time: 06/11/15  2:50 PM  Result Value Ref Range Status   Specimen Description SPUTUM  Final   Special Requests NONE  Final   Sputum evaluation THIS SPECIMEN IS ACCEPTABLE FOR SPUTUM CULTURE  Final   Report Status 06/13/2015 FINAL  Final  Culture, respiratory (NON-Expectorated)     Status: None   Collection Time: 06/11/15  2:50 PM  Result Value Ref Range Status   Specimen Description SPUTUM  Final   Special Requests NONE Reflexed from [id number]  Final   Gram Stain   Final    MODERATE WBC SEEN RARE GRAM POSITIVE COCCI GOOD  SPECIMEN - 80-90% WBCS    Culture MODERATE GROWTH STAPHYLOCOCCUS AUREUS  Final   Report Status 06/14/2015 FINAL  Final   Organism ID, Bacteria STAPHYLOCOCCUS AUREUS  Final      Susceptibility   Staphylococcus aureus - MIC*    CIPROFLOXACIN <=0.5 SENSITIVE Sensitive     GENTAMICIN <=0.5 SENSITIVE Sensitive     OXACILLIN 0.5 SENSITIVE Sensitive     TRIMETH/SULFA <=10 SENSITIVE Sensitive     CEFOXITIN SCREEN NEGATIVE Sensitive     Inducible Clindamycin NEGATIVE Sensitive     TETRACYCLINE Value in next row Sensitive      SENSITIVE<=1    * MODERATE GROWTH STAPHYLOCOCCUS AUREUS  Wound culture     Status: None   Collection Time: 06/13/15  1:56 PM  Result Value Ref Range Status   Specimen Description LEG  Final   Special Requests Normal  Final   Gram Stain   Final    FEW WBC SEEN MODERATE GRAM NEGATIVE RODS FEW GRAM POSITIVE COCCI    Culture   Final    LIGHT GROWTH STAPHYLOCOCCUS AUREUS RARE GROWTH PROTEUS PENNERI RARE GROWTH ENTEROCOCCUS FAECALIS    Report Status 06/17/2015 FINAL  Final   Organism ID, Bacteria STAPHYLOCOCCUS AUREUS  Final   Organism ID, Bacteria PROTEUS PENNERI  Final   Organism ID, Bacteria ENTEROCOCCUS FAECALIS  Final      Susceptibility   Staphylococcus aureus - MIC*    CIPROFLOXACIN <=0.5 SENSITIVE Sensitive     GENTAMICIN <=0.5 SENSITIVE Sensitive     OXACILLIN 0.5 SENSITIVE Sensitive     TRIMETH/SULFA <=10 SENSITIVE Sensitive     CEFOXITIN SCREEN NEGATIVE Sensitive     Inducible Clindamycin NEGATIVE Sensitive     TETRACYCLINE Value in next row Sensitive      SENSITIVE<=1    * LIGHT GROWTH STAPHYLOCOCCUS AUREUS  Proteus penneri - MIC*    AMPICILLIN Value in next row Resistant      SENSITIVE<=1    CEFAZOLIN Value in next row Resistant      SENSITIVE<=1    CEFTRIAXONE Value in next row Sensitive      SENSITIVE<=1    CIPROFLOXACIN Value in next row Sensitive      SENSITIVE<=1    GENTAMICIN Value in next row Sensitive      SENSITIVE<=1    IMIPENEM  Value in next row Sensitive      SENSITIVE<=1    NITROFURANTOIN Value in next row Resistant      SENSITIVE<=1    TRIMETH/SULFA Value in next row Sensitive      SENSITIVE<=1    PIP/TAZO Value in next row Sensitive      SENSITIVE<=4    * RARE GROWTH PROTEUS PENNERI   Enterococcus faecalis - MIC*    AMPICILLIN Value in next row Sensitive      SENSITIVE<=4    LINEZOLID Value in next row Sensitive      SENSITIVE<=4    * RARE GROWTH ENTEROCOCCUS FAECALIS  Wound culture     Status: None   Collection Time: 06/13/15  1:58 PM  Result Value Ref Range Status   Specimen Description LEG  Final   Special Requests Normal  Final   Gram Stain RARE WBC SEEN FEW GRAM NEGATIVE RODS   Final   Culture NO GROWTH 4 DAYS  Final   Report Status 06/17/2015 FINAL  Final    Medications:  Scheduled:  . sodium chloride   Intravenous Once  . amoxicillin-clavulanate  1 tablet Oral Q12H  . calcium gluconate  1 g Intravenous Once  . famotidine  20 mg Oral Q48H  . feeding supplement  1 Container Oral TID WC  . lactulose  20 g Oral BID  . metoprolol tartrate  50 mg Oral Q8H    Assessment: 64 y/o M with acute respiratory failure now extubated. Patient off CRRT and may begin HD tomorrow per nephrology.   Plan:  8/28 Electrolytes WNL. Labs in AM. 8/29 Calcium 7.8. 1 gram Calcium gluconate ordered x1. Recheck in AM.  Order for Augmentin 875-125mg  PO Q12H.  Transitioned to Augmentin 500-125mg  PO Q12H based on renal function as per consult.  Saheed Carrington S, [facility]  Clinical Pharmacist 06/25/2015,6:21 AM

## 2015-06-25 NOTE — Progress Notes (Signed)
Educated pt on use of incentive spirometer. Demonstrated proper use and indications. Pt was receptive and offered return demonstration. Pt has no questions and used device appropriately.

## 2015-06-25 NOTE — Progress Notes (Signed)
Palliative Medicine Inpatient Consult Follow Up Note   Name: Aaron Harrison Date: 06/25/2015 MRN: [id number]  DOB: September 06, 1951  Referring Physician: Aldean Jewett, MD   TODAY'S CONVERSATIONS, EVENTS, AND PLAN: 1)  DNR continues 2) Creatinine is rising and urine output is still not great.  Dialysis is a day to day decision for nephrology.  Patient's creatinine is not stable and it would need to be reasonably stable to recommend discharge (most likely to a skilled faciltiy). He would benefit from therapy as he is quite deconditioned from critical illness and he has potential to recover full functional status.   PT has recommended SNF and he does need rehab/ therapy.  3)  I will continue to check on patient periodically, but as he has made his wishes known (both verbally and in writing), there is not much to be decided going forward regarding palliative care.  I will sign off, but still review the chart and can be reconsulted at any time.    History:  Palliative Care consult requested for this 64 y.o. male for goals of medical therapy in patient with acute respiratory failure and anasarca/ CHF due to end stage cardiomyopathy.  He has been extubated and CRRT has been stopped but elevated creatinine continues to be a problem.  He recently completed a HCPOA and Living Will.  He is DNR (and would never want a feeding tube) but he says dialysis would be fine with him if needed.  That is a day to day decision per nephrologist, as patient's urine output is not very good and creatinine is rising and greater than 4.0.    REVIEW OF SYSTEMS:  Feeling better.  Intermittently confused per staff (he sundowns --but is rational during the early part of each day.   CODE STATUS: DNR   PAST MEDICAL HISTORY: Past Medical History  Diagnosis Date  . Chronic atrial fibrillation     a. not on long term anticoagulation  . Pneumonia 2011  . Chronic systolic CHF (congestive heart failure)   . Cellulitis     PAST  SURGICAL HISTORY: History reviewed. No pertinent past surgical history.  Vital Signs: BP 123/70 mmHg  Pulse 77  Temp(Src) 98.5 F (36.9 C) (Oral)  Resp 32  Ht 5\' 8"  (1.727 m)  Wt 110.9 kg (244 lb 7.8 oz)  BMI 37.18 kg/m2  SpO2 100% Filed Weights   06/23/15 0455 06/24/15 0500 06/25/15 0300  Weight: 111.4 kg (245 lb 9.5 oz) 108.1 kg (238 lb 5.1 oz) 110.9 kg (244 lb 7.8 oz)    Estimated body mass index is 37.18 kg/(m^2) as calculated from the following:   Height as of this encounter: 5\' 8"  (1.727 m).   Weight as of this encounter: 110.9 kg (244 lb 7.8 oz).  PHYSICAL EXAM: NAD His color looks good --pink instead of ashen He always has his head tilted down and to the right but can turn head EOMI OP clear Neck w/o JVD Hrt rrr no mgr Lungs clear , no rales but decreased BS in bases Abd soft and NT Ext --leg wounds are wrapped with gauze    (He says he has had leg wrappings for 'about a year now'.    LABS: CBC:    Component Value Date/Time   WBC 9.2 06/25/2015 0533   WBC 19.5* 07/16/2013 0239   HGB 10.0* 06/25/2015 0533   HGB 15.2 07/16/2013 0239   HCT 30.9* 06/25/2015 0533   HCT 43.8 07/16/2013 0239   PLT 91* 06/25/2015 0533  PLT 179 07/16/2013 0239   MCV 103.6* 06/25/2015 0533   MCV 90 07/16/2013 0239   NEUTROABS 7.1* 06/21/2015 0507   LYMPHSABS 0.6* 06/21/2015 0507   MONOABS 0.5 06/21/2015 0507   EOSABS 0.1 06/21/2015 0507   BASOSABS 0.0 06/21/2015 0507   Comprehensive Metabolic Panel:    Component Value Date/Time   NA 139 06/25/2015 0533   NA 137 07/16/2013 0239   K 3.7 06/25/2015 0533   K 3.0* 07/16/2013 0239   CL 102 06/25/2015 0533   CL 103 07/16/2013 0239   CO2 28 06/25/2015 0533   CO2 25 07/16/2013 0239   BUN 62* 06/25/2015 0533   BUN 19* 07/16/2013 0239   CREATININE 4.62* 06/25/2015 0533   CREATININE 1.53* 07/16/2013 0239   GLUCOSE 101* 06/25/2015 0533   GLUCOSE 198* 07/16/2013 0239   CALCIUM 7.8* 06/25/2015 0533   CALCIUM 8.8 07/16/2013 0239    AST 40 06/22/2015 0820   AST 22 07/16/2013 0239   ALT 28 06/22/2015 0820   ALT 23 07/16/2013 0239   ALKPHOS 47 06/22/2015 0820   ALKPHOS 91 07/16/2013 0239   BILITOT 2.7* 06/22/2015 0820   BILITOT 1.8* 07/16/2013 0239   PROT 6.5 06/22/2015 0820   PROT 7.3 07/16/2013 0239   ALBUMIN 3.2* 06/24/2015 0505   ALBUMIN 4.1 07/16/2013 0239    IMPRESSION: 1) Acute Respiratory Failure due to pneumonia (RUL infiltrate on adm CXR) and pulmonary edema/ CHF ---intubated in the ED 06/10/15 ---extubated 06/12/15 but failed BIPAP and was re-intubated urgently on 06/13/15 ---extubated again on 06/22/15 --- Dunn 2) Metabolic Encephalopathy ---greatly improved.  Sundowns some in the evenings but is very well oriented in daytime ---he has some short term recall issues --occasionally 2) Community Acquired Pneumonia ---Sputum Cx is growing MSSA (Bld and Ur Cxs neg) ---Treated with ABX as per ID consultant (Zosyn/ Vanco --then Unasyn ) ---Crit Care managing. 3) Afib with RVR w/ HR of about 150 at adm --and NSVT 11 beats last pm ---Followed by Cardiology (Ryan Dunn PA-C and Aris Everts MD)  ---rate controlling medications per his acute care team ---not a candidate for anticoagulation  4) Acute on Chronic Systolic Congestive Heart Failure with EF of 30-35% ---with chronic lower leg edema worse recently at home (pt also says he didn't like to take the lasix he was prescribed b/c it 'kept him in the bathroom'.  5) Suspected to have ischemic heart disease ---Mild elevations of troponin noted without diagnosis of MI (demand ischemia)  6) Acute Renal Failure on Chronic Kidney Disease stage 3 ---Likely due to ATN related to poor cardiac function  ---with baseline creatinine 1.3 and proteinuria. Creatinine going up daily recently.  ---management per nephrologist 7) Leg Cellulitis (related to venous stasis edema)--treated with Unasyn per ID ---being changed over to oral  ABX ---improved ---has prevented use of SCDs for VTE prophylaxis 8) Anemia of chronic disease and kidney disease 9) Metabolic Acidosis due to critical illness/ renal disease 10) Electrolyte Disarray --managed daily  11) History of Liver Disease --apparently in 2011 when he had organ failure and HD twice (per pts report)  --Ammonia levels have not been high here. ---Patient says he was on lactulose in 2011 when they told him his liver failed and he had a hospital stay for 'everything shutting down'. Pt says he does not drink alcohol . He doesn't know when he quit taking the lactulose but says he wasn't on it recently.  12) thrombocytopenia  --- Crit Care managing/ directing options  for VTE prophylaxis ---HIT panel ordered.  13) Sundowning. 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 14) Vomiting --none today so far  See top of note for plan  More than 50% of the visit was spent in counseling/coordination of care: YES  Time Spent:  35 min

## 2015-06-25 NOTE — Progress Notes (Signed)
Subjective:  Pt resting comfortably. Denies any worsening of shortness of breath. UOP documented as 1025 over the past 24 hours. Cr rising however.   Objective:  Vital signs in last 24 hours:  Temp:  [98.5 F (36.9 C)-99.3 F (37.4 C)] 98.5 F (36.9 C) (08/29 0730) Pulse Rate:  [69-95] 72 (08/29 0800) Resp:  [18-39] 20 (08/29 0800) BP: (97-142)/(57-84) 122/75 mmHg (08/29 0800) SpO2:  [87 %-100 %] 92 % (08/29 0800) Weight:  [110.9 kg (244 lb 7.8 oz)] 110.9 kg (244 lb 7.8 oz) (08/29 0300)  Weight change: 2.8 kg (6 lb 2.8 oz) Filed Weights   06/23/15 0455 06/24/15 0500 06/25/15 0300  Weight: 111.4 kg (245 lb 9.5 oz) 108.1 kg (238 lb 5.1 oz) 110.9 kg (244 lb 7.8 oz)    Intake/Output: I/O last 3 completed shifts: In: 2661 [P.O.:2661] Out: 1595 [Urine:1025; Other:570]   Intake/Output this shift:  Total I/O In: 100 [IV Piggyback:100] Out: -   Physical Exam: General: ill appearing  Head: Normocephalic, atraumatic.  ENT Moist mucus membranes hearing intact  Neck: Supple, trachea midline  Lungs:  Normal effort basilar rales  Heart: Irregular S1S2 no rubs  Abdomen:  nontender, mild distension, BS [resemt  Extremities:  + generalized edema  Neurologic:  resting comfortably   Skin: ulcertaion bilateral lower extremities on shins  Access: Femoral dialysis catheter    Basic Metabolic Panel:  Recent Labs Lab 06/22/15 0125 06/22/15 0820 06/22/15 2039 06/23/15 0502 06/24/15 0505 06/24/15 0506 06/25/15 0533  NA 141 140 141 143 142  --  139  K 4.3 4.3 4.3 4.2 3.9  --  3.7  CL 106 105 104 106 105  --  102  CO2 30 27 27 27 29   --  28  GLUCOSE 94 87 95 88 102*  --  101*  BUN 30* 33* 43* 47* 57*  --  62*  CREATININE 1.39* 1.80* 2.50* 2.97* 3.87*  --  4.62*  CALCIUM 8.9 8.9 8.7* 8.6* 8.5*  --  7.8*  MG 1.8 1.9 1.8 1.9  --  2.0 1.8  PHOS 2.6 2.7 3.2 3.6 5.2*  --   --     Liver Function Tests:  Recent Labs Lab 06/20/15 0439  06/22/15 0125 06/22/15 0820  06/22/15 2039 06/23/15 0502 06/24/15 0505  AST 23  --   --  40  --   --   --   ALT 19  --   --  28  --   --   --   ALKPHOS 53  --   --  47  --   --   --   BILITOT 3.3*  --   --  2.7*  --   --   --   PROT 6.5  --   --  6.5  --   --   --   ALBUMIN 3.5  < > 3.8 3.8  3.7 3.6 3.4* 3.2*  < > = values in this interval not displayed. No results for input(s): LIPASE, AMYLASE in the last 168 hours.  Recent Labs Lab 06/18/15 1115 06/22/15 1109  AMMONIA 24 53*    CBC:  Recent Labs Lab 06/20/15 0439 06/21/15 0507 06/25/15 0533  WBC 8.4 8.3 9.2  NEUTROABS  --  7.1*  --   HGB 12.3* 11.3* 10.0*  HCT 37.0* 34.6* 30.9*  MCV 102.3* 102.3* 103.6*  PLT 40* 34* 91*    Cardiac Enzymes: No results for input(s): CKTOTAL, CKMB, CKMBINDEX, TROPONINI in the last 168 hours.  BNP: Invalid input(s): POCBNP  CBG:  Recent Labs Lab 06/23/15 1115 06/23/15 1632 06/24/15 0717 06/24/15 1113 06/24/15 1622  GLUCAP 93 115* 78 111* 92    Microbiology: Results for orders placed or performed during the hospital encounter of 06/10/15  Blood Culture (routine x 2)     Status: None   Collection Time: 06/10/15  2:35 PM  Result Value Ref Range Status   Specimen Description BLOOD RIGHT WRIST  Final   Special Requests BOTTLES DRAWN AEROBIC AND ANAEROBIC  [place]  Final   Culture NO GROWTH 5 DAYS  Final   Report Status 06/15/2015 FINAL  Final  Blood Culture (routine x 2)     Status: None   Collection Time: 06/10/15  2:40 PM  Result Value Ref Range Status   Specimen Description BLOOD LEFT ASSIST CONTROL  Final   Special Requests   Final    BOTTLES DRAWN AEROBIC AND ANAEROBIC  AER 6CC ANA 4CC   Culture NO GROWTH 5 DAYS  Final   Report Status 06/15/2015 FINAL  Final  Urine culture     Status: None   Collection Time: 06/10/15  4:42 PM  Result Value Ref Range Status   Specimen Description URINE, RANDOM  Final   Special Requests NONE  Final   Culture 4,000 COLONIES/mL INSIGNIFICANT GROWTH  Final    Report Status 06/12/2015 FINAL  Final  MRSA PCR Screening     Status: None   Collection Time: 06/10/15  6:27 PM  Result Value Ref Range Status   MRSA by PCR NEGATIVE NEGATIVE Final    Comment:        The GeneXpert MRSA Assay (FDA approved for NASAL specimens only), is one component of a comprehensive MRSA colonization surveillance program. It is not intended to diagnose MRSA infection nor to guide or monitor treatment for MRSA infections.   Culture, sputum-assessment     Status: None   Collection Time: 06/11/15  2:50 PM  Result Value Ref Range Status   Specimen Description SPUTUM  Final   Special Requests NONE  Final   Sputum evaluation THIS SPECIMEN IS ACCEPTABLE FOR SPUTUM CULTURE  Final   Report Status 06/13/2015 FINAL  Final  Culture, respiratory (NON-Expectorated)     Status: None   Collection Time: 06/11/15  2:50 PM  Result Value Ref Range Status   Specimen Description SPUTUM  Final   Special Requests NONE Reflexed from [id number]  Final   Gram Stain   Final    MODERATE WBC SEEN RARE GRAM POSITIVE COCCI GOOD SPECIMEN - 80-90% WBCS    Culture MODERATE GROWTH STAPHYLOCOCCUS AUREUS  Final   Report Status 06/14/2015 FINAL  Final   Organism ID, Bacteria STAPHYLOCOCCUS AUREUS  Final      Susceptibility   Staphylococcus aureus - MIC*    CIPROFLOXACIN <=0.5 SENSITIVE Sensitive     GENTAMICIN <=0.5 SENSITIVE Sensitive     OXACILLIN 0.5 SENSITIVE Sensitive     TRIMETH/SULFA <=10 SENSITIVE Sensitive     CEFOXITIN SCREEN NEGATIVE Sensitive     Inducible Clindamycin NEGATIVE Sensitive     TETRACYCLINE Value in next row Sensitive      SENSITIVE<=1    * MODERATE GROWTH STAPHYLOCOCCUS AUREUS  Wound culture     Status: None   Collection Time: 06/13/15  1:56 PM  Result Value Ref Range Status   Specimen Description LEG  Final   Special Requests Normal  Final   Gram Stain   Final    FEW WBC SEEN MODERATE  GRAM NEGATIVE RODS FEW GRAM POSITIVE COCCI    Culture   Final    LIGHT  GROWTH STAPHYLOCOCCUS AUREUS RARE GROWTH PROTEUS PENNERI RARE GROWTH ENTEROCOCCUS FAECALIS    Report Status 06/17/2015 FINAL  Final   Organism ID, Bacteria STAPHYLOCOCCUS AUREUS  Final   Organism ID, Bacteria PROTEUS PENNERI  Final   Organism ID, Bacteria ENTEROCOCCUS FAECALIS  Final      Susceptibility   Staphylococcus aureus - MIC*    CIPROFLOXACIN <=0.5 SENSITIVE Sensitive     GENTAMICIN <=0.5 SENSITIVE Sensitive     OXACILLIN 0.5 SENSITIVE Sensitive     TRIMETH/SULFA <=10 SENSITIVE Sensitive     CEFOXITIN SCREEN NEGATIVE Sensitive     Inducible Clindamycin NEGATIVE Sensitive     TETRACYCLINE Value in next row Sensitive      SENSITIVE<=1    * LIGHT GROWTH STAPHYLOCOCCUS AUREUS   Proteus penneri - MIC*    AMPICILLIN Value in next row Resistant      SENSITIVE<=1    CEFAZOLIN Value in next row Resistant      SENSITIVE<=1    CEFTRIAXONE Value in next row Sensitive      SENSITIVE<=1    CIPROFLOXACIN Value in next row Sensitive      SENSITIVE<=1    GENTAMICIN Value in next row Sensitive      SENSITIVE<=1    IMIPENEM Value in next row Sensitive      SENSITIVE<=1    NITROFURANTOIN Value in next row Resistant      SENSITIVE<=1    TRIMETH/SULFA Value in next row Sensitive      SENSITIVE<=1    PIP/TAZO Value in next row Sensitive      SENSITIVE<=4    * RARE GROWTH PROTEUS PENNERI   Enterococcus faecalis - MIC*    AMPICILLIN Value in next row Sensitive      SENSITIVE<=4    LINEZOLID Value in next row Sensitive      SENSITIVE<=4    * RARE GROWTH ENTEROCOCCUS FAECALIS  Wound culture     Status: None   Collection Time: 06/13/15  1:58 PM  Result Value Ref Range Status   Specimen Description LEG  Final   Special Requests Normal  Final   Gram Stain RARE WBC SEEN FEW GRAM NEGATIVE RODS   Final   Culture NO GROWTH 4 DAYS  Final   Report Status 06/17/2015 FINAL  Final    Coagulation Studies: No results for input(s): LABPROT, INR in the last 72 hours.  Urinalysis: No results  for input(s): COLORURINE, LABSPEC, PHURINE, GLUCOSEU, HGBUR, BILIRUBINUR, KETONESUR, PROTEINUR, UROBILINOGEN, NITRITE, LEUKOCYTESUR in the last 72 hours.  Invalid input(s): APPERANCEUR    Imaging: Dg Chest Port 1 View  06/25/2015   CLINICAL DATA:  Respiratory failure  EXAM: PORTABLE CHEST - 1 VIEW  COMPARISON:  June 20, 2015  FINDINGS: The heart size and mediastinal contours are stable. The heart size is enlarged. Previously noted endotracheal tube and nasogastric tube have been removed. Left subclavian central venous line is unchanged. There is central pulmonary vascular congestion. There is no focal pneumonia or pleural effusion. The visualized skeletal structures are stable.  IMPRESSION: Interval removal of endotracheal tube and nasogastric tube. Mild central pulmonary vascular congestion.   Electronically Signed   By: Abelardo Diesel M.D.   On: 06/25/2015 07:24     Medications:     . sodium chloride   Intravenous Once  . amoxicillin-clavulanate  1 tablet Oral Q12H  . calcium gluconate  1 g Intravenous Once  .  famotidine  20 mg Oral Q48H  . feeding supplement  1 Container Oral TID WC  . lactulose  20 g Oral BID  . metoprolol tartrate  50 mg Oral Q8H   ammonium lactate, anticoagulant sodium citrate, ipratropium-albuterol, LORazepam, ondansetron (ZOFRAN) IV, senna-docusate  Assessment/ Plan:  64 y.o. 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 Upon admission the patient underwent intubation with transition to a mechanical ventilation.  1. Acute renal failure/chronic kidney disease stage III with baseline creatinine 1.3/proteinuria. The patient most likely has acute renal failure secondary to altered cardiorenal hemodynamics. His ejection fraction is low at 30%. - BUN/Cr rising off of  dialysis, was previously receiving lasix.  Breathing comfortably at the moment.  Will continue to monitor renal function for now.  If bun/cr continue to rise may need to consider restarting dailsysi but will hold off for now.  2. Acute respiratory failure. From  underlying heart failure/pulmonary edema.  - doing well post extubation, breathing comfortably.   3.generalized edema from  Acute systolic heart failure. EF 30-35%. - has improved since I last saw him, had considerable UF with CRRT.  Also was receiving lasix.  Will continue to clinically monitor this.    LOS: 15 Joclynn Lumb 8/29/20169:05 AM

## 2015-06-25 NOTE — Progress Notes (Signed)
Nutrition Follow-up       INTERVENTION:  Meals and snacks: Cater to pt preferences Nutrition Supplement Therapy: Continue boost breeze TID for added nutrition   NUTRITION DIAGNOSIS:   Inadequate oral intake related to acute illness as evidenced by NPO status, being addressed with tube feeding and supplement    GOAL:   Patient will meet greater than or equal to 90% of their needs    MONITOR:    (Energy Intake, Anthropometrics, Digestive System, Electrolyte/Renal Profile)  REASON FOR ASSESSMENT:   Consult Enteral/tube feeding initiation and management  ASSESSMENT:       Current Nutrition: eating 10-20% per documentation.  Pt reports eating a little and drinking some of boost breeze supplement   Gastrointestinal Profile: Last BM: 8/29   Medications: reviewed  Electrolyte/Renal Profile and Glucose Profile:   Recent Labs Lab 06/22/15 2039 06/23/15 0502 06/24/15 0505 06/24/15 0506 06/25/15 0533  NA 141 143 142  --  139  K 4.3 4.2 3.9  --  3.7  CL 104 106 105  --  102  CO2 27 27 29   --  28  BUN 43* 47* 57*  --  62*  CREATININE 2.50* 2.97* 3.87*  --  4.62*  CALCIUM 8.7* 8.6* 8.5*  --  7.8*  MG 1.8 1.9  --  2.0 1.8  PHOS 3.2 3.6 5.2*  --   --   GLUCOSE 95 88 102*  --  101*   Protein Profile:  Recent Labs Lab 06/22/15 2039 06/23/15 0502 06/24/15 0505  ALBUMIN 3.6 3.4* 3.2*        Weight Trend since Admission: Filed Weights   06/23/15 0455 06/24/15 0500 06/25/15 0300  Weight: 245 lb 9.5 oz (111.4 kg) 238 lb 5.1 oz (108.1 kg) 244 lb 7.8 oz (110.9 kg)      Diet Order:  Diet heart healthy/carb modified Room service appropriate?: Yes; Fluid consistency:: Thin  Skin:  Reviewed, no issues      Height:   Ht Readings from Last 1 Encounters:  06/13/15 5\' 8"  (1.727 m)    Weight:   Wt Readings from Last 1 Encounters:  06/25/15 244 lb 7.8 oz (110.9 kg)   BMI:  Body mass index is 37.18 kg/(m^2).  Estimated Nutritional Needs:   Kcal:   1750-2100   Protein:  84-105 g (1.2-1.5 g/kg)   Fluid:  1000 mL plus UOP  EDUCATION NEEDS:      MODERATE Care Level  Ardath Lepak B. Zenia Resides, [place], [place] (pager)

## 2015-06-26 DIAGNOSIS — N189 Chronic kidney disease, unspecified: Secondary | ICD-10-CM

## 2015-06-26 LAB — RENAL FUNCTION PANEL
ANION GAP: 11 (ref 5–15)
Albumin: 2.9 g/dL — ABNORMAL LOW (ref 3.5–5.0)
BUN: 62 mg/dL — ABNORMAL HIGH (ref 6–20)
CALCIUM: 8.1 mg/dL — AB (ref 8.9–10.3)
CHLORIDE: 100 mmol/L — AB (ref 101–111)
CO2: 26 mmol/L (ref 22–32)
Creatinine, Ser: 4.69 mg/dL — ABNORMAL HIGH (ref 0.61–1.24)
GFR calc Af Amer: 14 mL/min — ABNORMAL LOW (ref >60)
GFR calc non Af Amer: 12 mL/min — ABNORMAL LOW (ref >60)
GLUCOSE: 92 mg/dL (ref 65–99)
Phosphorus: 5.9 mg/dL — ABNORMAL HIGH (ref 2.5–4.6)
Potassium: 3.6 mmol/L (ref 3.5–5.1)
SODIUM: 137 mmol/L (ref 135–145)

## 2015-06-26 LAB — BASIC METABOLIC PANEL
ANION GAP: 10 (ref 5–15)
BUN: 64 mg/dL — AB (ref 6–20)
CHLORIDE: 101 mmol/L (ref 101–111)
CO2: 26 mmol/L (ref 22–32)
Calcium: 8 mg/dL — ABNORMAL LOW (ref 8.9–10.3)
Creatinine, Ser: 4.72 mg/dL — ABNORMAL HIGH (ref 0.61–1.24)
GFR calc Af Amer: 14 mL/min — ABNORMAL LOW (ref >60)
GFR calc non Af Amer: 12 mL/min — ABNORMAL LOW (ref >60)
GLUCOSE: 92 mg/dL (ref 65–99)
POTASSIUM: 3.5 mmol/L (ref 3.5–5.1)
Sodium: 137 mmol/L (ref 135–145)

## 2015-06-26 LAB — CBC
HEMATOCRIT: 32.8 % — AB (ref 40.0–52.0)
HEMOGLOBIN: 10.7 g/dL — AB (ref 13.0–18.0)
MCH: 33.6 pg (ref 26.0–34.0)
MCHC: 32.7 g/dL (ref 32.0–36.0)
MCV: 102.7 fL — AB (ref 80.0–100.0)
PLATELETS: 95 10*3/uL — AB (ref 150–440)
RBC: 3.19 MIL/uL — AB (ref 4.40–5.90)
RDW: 16.4 % — ABNORMAL HIGH (ref 11.5–14.5)
WBC: 8.2 10*3/uL (ref 3.8–10.6)

## 2015-06-26 LAB — MAGNESIUM: Magnesium: 1.8 mg/dL (ref 1.7–2.4)

## 2015-06-26 MED ORDER — NOREPINEPHRINE 4 MG/250ML-% IV SOLN
INTRAVENOUS | Status: AC
  Filled 2015-06-26: qty 250

## 2015-06-26 MED ORDER — LEVALBUTEROL HCL 0.63 MG/3ML IN NEBU
INHALATION_SOLUTION | RESPIRATORY_TRACT | Status: AC
  Filled 2015-06-26: qty 3

## 2015-06-26 NOTE — Progress Notes (Signed)
Physical Therapy Treatment Patient Details Name: Aaron Harrison MRN: [id number] DOB: Dec 11, 1950 Today's Date: 06/26/2015    History of Present Illness presented to ER secondary to weakness, AMS; admitted (and subsequently intubated) for acute hypercarbic/hypoxic respiratory failure secondary to CHF exacerbation.  Hospital course also significant for liver dysfunction, acute encephalopathy and renal failure (requiring CRRT, ended 8/25; now with R temp fem cath); successfully extubated 8/25, currently on 1L O2 via [place].    PT Comments    Patient R femoral temporary dialysis catheter now removed; cleared for OOB activity per RN.  Able to complete rolling, supine/sit, sit/stand and basic transfers with RW this date, though requiring extensive physical assist (mod/max +2) for all functional activities.  Significant posterior weight shift/trunk lean with all upright positions; very limited spontaneous righting reactions noted.  Remains generally delayed in processing, comprehension, task initiation/sequencing; increased time and frequent cuing required to complete all tasks. Vitals stable and WFL throughout session on 2L supplemental O2.  No orthostasis noted.   Follow Up Recommendations  SNF     Equipment Recommendations       Recommendations for Other Services       Precautions / Restrictions Precautions Precautions: Fall Restrictions Weight Bearing Restrictions: No    Mobility  Bed Mobility Overal bed mobility: Needs Assistance Bed Mobility: Rolling;Supine to Sit Rolling: Mod assist;Max assist   Supine to sit: Mod assist;Max assist     General bed mobility comments: poor task initiation and sequencing; hand over hand required to initiate all functional movement  Transfers Overall transfer level: Needs assistance Equipment used: Rolling walker (2 wheeled) Transfers: Sit to/from Stand Sit to Stand: Mod assist;Max assist;+2 physical assistance         General transfer  comment: assist for foot placement, hand placement, anterior weight translation and lift off from seating surface  Ambulation/Gait Ambulation/Gait assistance: Mod assist;+2 physical assistance;Max assist Ambulation Distance (Feet): 5 Feet Assistive device: Rolling walker (2 wheeled)       General Gait Details: very short, shuffling steps; posterior trunk lean/weight shift with absent attempts at spontanous correction.  Very unsteady.  Unsafe to attempt without RW and +2 assist.   Stairs            Wheelchair Mobility    Modified Rankin (Stroke Patients Only)       Balance Overall balance assessment: Needs assistance Sitting-balance support: No upper extremity supported;Feet supported Sitting balance-Leahy Scale: Fair Sitting balance - Comments: min assist for unsupported sitting balance Postural control: Posterior lean Standing balance support: Bilateral upper extremity supported Standing balance-Leahy Scale: Poor                 High Level Balance Comments: absent spontaneous balance/righting reactions; excessive posterior weight shift.  Very high fall risk.    Cognition Arousal/Alertness: Awake/alert Behavior During Therapy: WFL for tasks assessed/performed Overall Cognitive Status: Difficult to assess (poor STM, limited insight/awareness, difficulty with task initiation at times)                      Exercises Other Exercises Other Exercises: Rolling bilat, mod/max assist for dependent care after incontinent bowel/bladder; requiring hand-over-hand for UE placement, task initiation Other Exercises: Sit/stand with RW x3, mod/max assist +2--posterior weight shift/trunk lean with absent balance/righting reactions.  Poor eccentric control with stand to sit.    General Comments        Pertinent Vitals/Pain Pain Assessment: No/denies pain    Home Living  Prior Function            PT Goals (current goals can now be  found in the care plan section) Acute Rehab PT Goals Patient Stated Goal: to get stronger PT Goal Formulation: With patient Time For Goal Achievement: 07/07/15 Potential to Achieve Goals: Fair Progress towards PT goals: Progressing toward goals    Frequency  Min 2X/week    PT Plan Current plan remains appropriate    Co-evaluation             End of Session Equipment Utilized During Treatment: Gait belt Activity Tolerance: Patient tolerated treatment well Patient left: in chair;with call bell/phone within reach;with chair alarm set     Time: 1332-1400 PT Time Calculation (min) (ACUTE ONLY): 28 min  Charges:  $Gait Training: 8-22 mins $Therapeutic Activity: 8-22 mins                    G Codes:      Shariq Puig H. Owens Shark, PT, DPT, NCS 06/26/2015, 2:11 PM [phone]

## 2015-06-26 NOTE — Progress Notes (Signed)
   06/26/15 1040  Clinical Encounter Type  Visited With Patient  Referral From Chaplain  Spiritual Encounters  Spiritual Needs Emotional  Stress Factors  Patient Stress Factors None identified  Chaplain rounded in the unit and checked in with the patient. Chaplain Sonya A. Laws Ext. [phone]

## 2015-06-26 NOTE — Consult Note (Signed)
WOC wound follow up Wound type: Chronic venous insufficiency.  Seen at [facility] for weekly compression.  Measurement:Left anterior calf 2 cm x 1.2 cm scabbed Lesion left posterior 2 cm x 2 cm x 0.2 cm Right anterior calf 4 cm x 2 cm x 0.2 cm 100% pink and moist posterior right 2 cm x 1 cm x 0.2 cm Wound CWU:GQBVQ red Drainage (amount, consistency, odor) Moderate serosanguinous drainage  No odor Periwound:Dry skin Dressing procedure/placement/frequency:Cleanse bilateral legs with soap and water.  Apply Xeroform gauze to wound bed.  Cover with Calamine based Unnas boots.  Cover nonintact areas with ABD for added absorption.  Secure with Coban.  Wrap from base of toes to just below the knee.  Change Monday and Thursday.  [place] team will continue to follow.   Domenic Moras RN BSN [place] Pager [phone]

## 2015-06-26 NOTE — Clinical Social Work Note (Signed)
CSW is in the process of working with hospital staff to determine if pt will be eligible for Medicaid.  CSW will continue to follow and assist with d/c planning needs.  [place], [place]

## 2015-06-26 NOTE — Progress Notes (Signed)
[facility] NOTE  Pharmacy Consult for Electrolytes/CRRT dose adjustment    Allergies  Allergen Reactions  . Penicillins Nausea And Vomiting    Patient Measurements: Height: 5\' 8"  (172.7 cm) Weight: 246 lb 14.6 oz (112 kg) IBW/kg (Calculated) : 68.4   Vital Signs: Temp: 98.6 F (37 C) (08/29 1900) Temp Source: Oral (08/29 1900) BP: 99/49 mmHg (08/30 0400) Pulse Rate: 73 (08/30 0500) Intake/Output from previous day: 08/29 0701 - 08/30 0700 In: 100 [IV Piggyback:100] Out: -  Intake/Output from this shift:   Vent settings for last 24 hours:    Labs:  Recent Labs  06/24/15 0505 06/24/15 0506 06/25/15 0533 06/26/15 0530  WBC  --   --  9.2 8.2  HGB  --   --  10.0* 10.7*  HCT  --   --  30.9* 32.8*  PLT  --   --  91* 95*  CREATININE 3.87*  --  4.62* 4.69*  4.72*  MG  --  2.0 1.8 1.8  PHOS 5.2*  --   --  5.9*  ALBUMIN 3.2*  --   --  2.9*   Estimated Creatinine Clearance: 19.8 mL/min (by C-G formula based on Cr of 4.69).   Recent Labs  06/24/15 1113 06/24/15 1622 06/25/15 1621  GLUCAP 111* 92 89   BMP Latest Ref Rng 06/26/2015 06/26/2015 06/25/2015  Glucose 65 - 99 mg/dL 92 92 101(H)  BUN 6 - 20 mg/dL 64(H) 62(H) 62(H)  Creatinine 0.61 - 1.24 mg/dL 4.72(H) 4.69(H) 4.62(H)  Sodium 135 - 145 mmol/L 137 137 139  Potassium 3.5 - 5.1 mmol/L 3.5 3.6 3.7  Chloride 101 - 111 mmol/L 101 100(L) 102  CO2 22 - 32 mmol/L 26 26 28   Calcium 8.9 - 10.3 mg/dL 8.0(L) 8.1(L) 7.8(L)     Microbiology: Recent Results (from the past 720 hour(s))  Blood Culture (routine x 2)     Status: None   Collection Time: 06/10/15  2:35 PM  Result Value Ref Range Status   Specimen Description BLOOD RIGHT WRIST  Final   Special Requests BOTTLES DRAWN AEROBIC AND ANAEROBIC  1CC  Final   Culture NO GROWTH 5 DAYS  Final   Report Status 06/15/2015 FINAL  Final  Blood Culture (routine x 2)     Status: None   Collection Time: 06/10/15  2:40 PM  Result Value Ref Range  Status   Specimen Description BLOOD LEFT ASSIST CONTROL  Final   Special Requests   Final    BOTTLES DRAWN AEROBIC AND ANAEROBIC  AER 6CC ANA 4CC   Culture NO GROWTH 5 DAYS  Final   Report Status 06/15/2015 FINAL  Final  Urine culture     Status: None   Collection Time: 06/10/15  4:42 PM  Result Value Ref Range Status   Specimen Description URINE, RANDOM  Final   Special Requests NONE  Final   Culture 4,000 COLONIES/mL INSIGNIFICANT GROWTH  Final   Report Status 06/12/2015 FINAL  Final  MRSA PCR Screening     Status: None   Collection Time: 06/10/15  6:27 PM  Result Value Ref Range Status   MRSA by PCR NEGATIVE NEGATIVE Final    Comment:        The GeneXpert MRSA Assay (FDA approved for NASAL specimens only), is one component of a comprehensive MRSA colonization surveillance program. It is not intended to diagnose MRSA infection nor to guide or monitor treatment for MRSA infections.   Culture, sputum-assessment  Status: None   Collection Time: 06/11/15  2:50 PM  Result Value Ref Range Status   Specimen Description SPUTUM  Final   Special Requests NONE  Final   Sputum evaluation THIS SPECIMEN IS ACCEPTABLE FOR SPUTUM CULTURE  Final   Report Status 06/13/2015 FINAL  Final  Culture, respiratory (NON-Expectorated)     Status: None   Collection Time: 06/11/15  2:50 PM  Result Value Ref Range Status   Specimen Description SPUTUM  Final   Special Requests NONE Reflexed from [id number]  Final   Gram Stain   Final    MODERATE WBC SEEN RARE GRAM POSITIVE COCCI GOOD SPECIMEN - 80-90% WBCS    Culture MODERATE GROWTH STAPHYLOCOCCUS AUREUS  Final   Report Status 06/14/2015 FINAL  Final   Organism ID, Bacteria STAPHYLOCOCCUS AUREUS  Final      Susceptibility   Staphylococcus aureus - MIC*    CIPROFLOXACIN <=0.5 SENSITIVE Sensitive     GENTAMICIN <=0.5 SENSITIVE Sensitive     OXACILLIN 0.5 SENSITIVE Sensitive     TRIMETH/SULFA <=10 SENSITIVE Sensitive     CEFOXITIN SCREEN  NEGATIVE Sensitive     Inducible Clindamycin NEGATIVE Sensitive     TETRACYCLINE Value in next row Sensitive      SENSITIVE<=1    * MODERATE GROWTH STAPHYLOCOCCUS AUREUS  Wound culture     Status: None   Collection Time: 06/13/15  1:56 PM  Result Value Ref Range Status   Specimen Description LEG  Final   Special Requests Normal  Final   Gram Stain   Final    FEW WBC SEEN MODERATE GRAM NEGATIVE RODS FEW GRAM POSITIVE COCCI    Culture   Final    LIGHT GROWTH STAPHYLOCOCCUS AUREUS RARE GROWTH PROTEUS PENNERI RARE GROWTH ENTEROCOCCUS FAECALIS    Report Status 06/17/2015 FINAL  Final   Organism ID, Bacteria STAPHYLOCOCCUS AUREUS  Final   Organism ID, Bacteria PROTEUS PENNERI  Final   Organism ID, Bacteria ENTEROCOCCUS FAECALIS  Final      Susceptibility   Staphylococcus aureus - MIC*    CIPROFLOXACIN <=0.5 SENSITIVE Sensitive     GENTAMICIN <=0.5 SENSITIVE Sensitive     OXACILLIN 0.5 SENSITIVE Sensitive     TRIMETH/SULFA <=10 SENSITIVE Sensitive     CEFOXITIN SCREEN NEGATIVE Sensitive     Inducible Clindamycin NEGATIVE Sensitive     TETRACYCLINE Value in next row Sensitive      SENSITIVE<=1    * LIGHT GROWTH STAPHYLOCOCCUS AUREUS   Proteus penneri - MIC*    AMPICILLIN Value in next row Resistant      SENSITIVE<=1    CEFAZOLIN Value in next row Resistant      SENSITIVE<=1    CEFTRIAXONE Value in next row Sensitive      SENSITIVE<=1    CIPROFLOXACIN Value in next row Sensitive      SENSITIVE<=1    GENTAMICIN Value in next row Sensitive      SENSITIVE<=1    IMIPENEM Value in next row Sensitive      SENSITIVE<=1    NITROFURANTOIN Value in next row Resistant      SENSITIVE<=1    TRIMETH/SULFA Value in next row Sensitive      SENSITIVE<=1    PIP/TAZO Value in next row Sensitive      SENSITIVE<=4    * RARE GROWTH PROTEUS PENNERI   Enterococcus faecalis - MIC*    AMPICILLIN Value in next row Sensitive      SENSITIVE<=4    LINEZOLID Value in  next row Sensitive       SENSITIVE<=4    * RARE GROWTH ENTEROCOCCUS FAECALIS  Wound culture     Status: None   Collection Time: 06/13/15  1:58 PM  Result Value Ref Range Status   Specimen Description LEG  Final   Special Requests Normal  Final   Gram Stain RARE WBC SEEN FEW GRAM NEGATIVE RODS   Final   Culture NO GROWTH 4 DAYS  Final   Report Status 06/17/2015 FINAL  Final    Medications:  Scheduled:  . sodium chloride   Intravenous Once  . famotidine  20 mg Oral Q48H  . feeding supplement  1 Container Oral TID WC  . lactulose  20 g Oral BID  . levalbuterol      . metoprolol tartrate  50 mg Oral Q8H  . norepinephrine        Assessment: 64 y/o M with acute respiratory failure now extubated. Patient off CRRT and may begin HD tomorrow per nephrology.   Plan:  8/28 Electrolytes WNL. Labs in AM. 8/29 Calcium 7.8. 1 gram Calcium gluconate ordered x1. Recheck in AM.  Order for Augmentin 875-125mg  PO Q12H.  Transitioned to Augmentin 500-125mg  PO Q12H based on renal function as per consult.  0830 AM: calcium 8.1, albumin 2.9, corrected calcium approximately 9 (WNL). K 3.6 WNL, phos 5.9 please consider limiting dietary sources of phosphate. Will continue to monitor.    Laural Benes, [facility]  Clinical Pharmacist 06/26/2015,6:23 AM

## 2015-06-26 NOTE — Progress Notes (Signed)
Palliative Medicine Inpatient Consult Follow Up Note   Name: Aaron Harrison Date: 06/26/2015 MRN: [id number]  DOB: 1950-12-31  Referring Physician: Aldean Jewett, MD  Palliative Care consult requested for this 64 y.o. male for goals of medical therapy in patient with ongoing renal failure and cardiomyopathy following a critical care illness.     TODAY'S CONVERSATIONS AND PLAN: 1.  I was asked to assess whether pt is or is not able to do executive tasks such as completing a will.  His attorney friend (and [place]) would like to know before he brings people up here to get this completed.  Pt has asked [name] to do this for him.  I find that the patient is fully oriented but he does get a bit confused in the evenings still.  Pt still sundowns slightly. I recommend bringing paperwork in before 3 pm --never in the evenings.  2.  I am told by [name] that pt is now 4 mos behind on his mortgage, he has not paid utilities, he has no means of paying for anything and there really won't be anything left of pts assets /possessions to pass on to others --but this will is being done more to satisfy the pt. 3.   Mr Rowe Robert asked that I assess the degree to which pt appreciates the severity of his illness.  My take on it is that pt probably would not appreciate fully the complexity or severity of his illness/ condition --even if he had not been through a critical care stay.  It seems to be his personality to be a bit cavalier.  This could have to do with an early/ mild dementia --or just his personality.  I don't think telling him again about how bad his heart and kidneys are at this time will sink in any more than it has to date.   4.  The HCPOA needs to be listed as such in the computer and I informed Mr Rowe Robert that he might want to see that this gets addressed.   5.  Again, I am signing off, but can be asked to see pt at any time should there be a need.     CODE STATUS: DNR   PAST MEDICAL HISTORY: Past  Medical History  Diagnosis Date  . Chronic atrial fibrillation     a. not on long term anticoagulation  . Pneumonia 2011  . Chronic systolic CHF (congestive heart failure)   . Cellulitis     PAST SURGICAL HISTORY: History reviewed. No pertinent past surgical history.  Vital Signs: BP 121/74 mmHg  Pulse 88  Temp(Src) 98.1 F (36.7 C) (Oral)  Resp 24  Ht 5\' 8"  (1.727 m)  Wt 112 kg (246 lb 14.6 oz)  BMI 37.55 kg/m2  SpO2 100% Filed Weights   06/24/15 0500 06/25/15 0300 06/26/15 0500  Weight: 108.1 kg (238 lb 5.1 oz) 110.9 kg (244 lb 7.8 oz) 112 kg (246 lb 14.6 oz)    Estimated body mass index is 37.55 kg/(m^2) as calculated from the following:   Height as of this encounter: 5\' 8"  (1.727 m).   Weight as of this encounter: 112 kg (246 lb 14.6 oz).  PHYSICAL EXAM: NAD EOMI OP clear Lungs clear  Heart rrr no mgr Abd soft and NT Ext --now legs are to be wrapped in UNNA BOOTS w/ calamine He is fully alert time 4   LABS: CBC:    Component Value Date/Time   WBC 8.2 06/26/2015 0530  WBC 19.5* 07/16/2013 0239   HGB 10.7* 06/26/2015 0530   HGB 15.2 07/16/2013 0239   HCT 32.8* 06/26/2015 0530   HCT 43.8 07/16/2013 0239   PLT 95* 06/26/2015 0530   PLT 179 07/16/2013 0239   MCV 102.7* 06/26/2015 0530   MCV 90 07/16/2013 0239   NEUTROABS 7.1* 06/21/2015 0507   LYMPHSABS 0.6* 06/21/2015 0507   MONOABS 0.5 06/21/2015 0507   EOSABS 0.1 06/21/2015 0507   BASOSABS 0.0 06/21/2015 0507   Comprehensive Metabolic Panel:    Component Value Date/Time   NA 137 06/26/2015 0530   NA 137 06/26/2015 0530   NA 137 07/16/2013 0239   K 3.6 06/26/2015 0530   K 3.5 06/26/2015 0530   K 3.0* 07/16/2013 0239   CL 100* 06/26/2015 0530   CL 101 06/26/2015 0530   CL 103 07/16/2013 0239   CO2 26 06/26/2015 0530   CO2 26 06/26/2015 0530   CO2 25 07/16/2013 0239   BUN 62* 06/26/2015 0530   BUN 64* 06/26/2015 0530   BUN 19* 07/16/2013 0239   CREATININE 4.69* 06/26/2015 0530   CREATININE  4.72* 06/26/2015 0530   CREATININE 1.53* 07/16/2013 0239   GLUCOSE 92 06/26/2015 0530   GLUCOSE 92 06/26/2015 0530   GLUCOSE 198* 07/16/2013 0239   CALCIUM 8.1* 06/26/2015 0530   CALCIUM 8.0* 06/26/2015 0530   CALCIUM 8.8 07/16/2013 0239   AST 40 06/22/2015 0820   AST 22 07/16/2013 0239   ALT 28 06/22/2015 0820   ALT 23 07/16/2013 0239   ALKPHOS 47 06/22/2015 0820   ALKPHOS 91 07/16/2013 0239   BILITOT 2.7* 06/22/2015 0820   BILITOT 1.8* 07/16/2013 0239   PROT 6.5 06/22/2015 0820   PROT 7.3 07/16/2013 0239   ALBUMIN 2.9* 06/26/2015 0530   ALBUMIN 4.1 07/16/2013 0239    IMPRESSION: 1.  Acute on Chronic Renal Failure --probably going to need HD going forward 2.  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  Pneumonia --treated and resolved 4.  AFIB --chronic 5.  Suspected ischemic heart disease 6.  Congestive liver disease (which had occurred in the past also) --LFTs much improved 7.  Acute lower leg cellulitis---treated --with chronic dependent edema  --with leg dressings wraps 8.  Thrombocytopenia--stable   See top of note for plan  More than 50% of the visit was spent in counseling/coordination of care: YES--I spoke at length with his [place] friend.    Time Spent: 35 minutes

## 2015-06-26 NOTE — Progress Notes (Signed)
Subjective:  Renal function worse this AM. No UOP recorded. Temp HD catheter removed.   Objective:  Vital signs in last 24 hours:  Temp:  [98.1 F (36.7 C)-98.6 F (37 C)] 98.1 F (36.7 C) (08/30 0800) Pulse Rate:  [33-89] 89 (08/30 0800) Resp:  [20-34] 29 (08/30 0800) BP: (99-150)/(49-98) 121/98 mmHg (08/30 0800) SpO2:  [84 %-100 %] 100 % (08/30 0800) Weight:  [112 kg (246 lb 14.6 oz)] 112 kg (246 lb 14.6 oz) (08/30 0500)  Weight change: 1.1 kg (2 lb 6.8 oz) Filed Weights   06/24/15 0500 06/25/15 0300 06/26/15 0500  Weight: 108.1 kg (238 lb 5.1 oz) 110.9 kg (244 lb 7.8 oz) 112 kg (246 lb 14.6 oz)    Intake/Output: I/O last 3 completed shifts: In: 517 [P.O.:417; IV Piggyback:100] Out: 60 [Urine:60]   Intake/Output this shift:     Physical Exam: General: ill appearing  Head: Normocephalic, atraumatic.  ENT Moist mucus membranes hearing intact  Neck: Supple, trachea midline  Lungs:  Normal effort basilar rales  Heart: Irregular S1S2 no rubs  Abdomen:  nontender, mild distension, BS [resemt  Extremities:  + generalized edema, b/l legs wrapped.  Neurologic:  resting comfortably   Skin: ulcertaion bilateral lower extremities on shins  Access: Femoral dialysis catheter removed    Basic Metabolic Panel:  Recent Labs Lab 06/22/15 0820 06/22/15 2039 06/23/15 0502 06/24/15 0505 06/24/15 0506 06/25/15 0533 06/26/15 0530  NA 140 141 143 142  --  139 137  137  K 4.3 4.3 4.2 3.9  --  3.7 3.6  3.5  CL 105 104 106 105  --  102 100*  101  CO2 _0 --  _1 GLUCOSE 87 95 88 102*  --  101* 92  92  BUN 33* 43* 47* 57*  --  62* 62*  64*  CREATININE 1.80* 2.50* 2.97* 3.87*  --  4.62* 4.69*  4.72*  CALCIUM 8.9 8.7* 8.6* 8.5*  --  7.8* 8.1*  8.0*  MG 1.9 1.8 1.9  --  2.0 1.8 1.8  PHOS 2.7 3.2 3.6 5.2*  --   --  5.9*    Liver Function Tests:  Recent Labs Lab 06/20/15 0439  06/22/15 0820 06/22/15 2039 06/23/15 0502 06/24/15 0505 06/26/15 0530   AST 23  --  40  --   --   --   --   ALT 19  --  28  --   --   --   --   ALKPHOS 53  --  47  --   --   --   --   BILITOT 3.3*  --  2.7*  --   --   --   --   PROT 6.5  --  6.5  --   --   --   --   ALBUMIN 3.5  < > 3.8  3.7 3.6 3.4* 3.2* 2.9*  < > = values in this interval not displayed. No results for input(s): LIPASE, AMYLASE in the last 168 hours.  Recent Labs Lab 06/22/15 1109  AMMONIA 53*    CBC:  Recent Labs Lab 06/20/15 0439 06/21/15 0507 06/25/15 0533 06/26/15 0530  WBC 8.4 8.3 9.2 8.2  NEUTROABS  --  7.1*  --   --   HGB 12.3* 11.3* 10.0* 10.7*  HCT 37.0* 34.6* 30.9* 32.8*  MCV 102.3* 102.3* 103.6* 102.7*  PLT 40* 34* 91* 95*    Cardiac Enzymes: No results for  input(s): CKTOTAL, CKMB, CKMBINDEX, TROPONINI in the last 168 hours.  BNP: Invalid input(s): POCBNP  CBG:  Recent Labs Lab 06/23/15 1632 06/24/15 0717 06/24/15 1113 06/24/15 1622 06/25/15 1621  GLUCAP 115* 78 111* 92 89    Microbiology: Results for orders placed or performed during the hospital encounter of 06/10/15  Blood Culture (routine x 2)     Status: None   Collection Time: 06/10/15  2:35 PM  Result Value Ref Range Status   Specimen Description BLOOD RIGHT WRIST  Final   Special Requests BOTTLES DRAWN AEROBIC AND ANAEROBIC  [place]  Final   Culture NO GROWTH 5 DAYS  Final   Report Status 06/15/2015 FINAL  Final  Blood Culture (routine x 2)     Status: None   Collection Time: 06/10/15  2:40 PM  Result Value Ref Range Status   Specimen Description BLOOD LEFT ASSIST CONTROL  Final   Special Requests   Final    BOTTLES DRAWN AEROBIC AND ANAEROBIC  AER 6CC ANA 4CC   Culture NO GROWTH 5 DAYS  Final   Report Status 06/15/2015 FINAL  Final  Urine culture     Status: None   Collection Time: 06/10/15  4:42 PM  Result Value Ref Range Status   Specimen Description URINE, RANDOM  Final   Special Requests NONE  Final   Culture 4,000 COLONIES/mL INSIGNIFICANT GROWTH  Final   Report Status  06/12/2015 FINAL  Final  MRSA PCR Screening     Status: None   Collection Time: 06/10/15  6:27 PM  Result Value Ref Range Status   MRSA by PCR NEGATIVE NEGATIVE Final    Comment:        The GeneXpert MRSA Assay (FDA approved for NASAL specimens only), is one component of a comprehensive MRSA colonization surveillance program. It is not intended to diagnose MRSA infection nor to guide or monitor treatment for MRSA infections.   Culture, sputum-assessment     Status: None   Collection Time: 06/11/15  2:50 PM  Result Value Ref Range Status   Specimen Description SPUTUM  Final   Special Requests NONE  Final   Sputum evaluation THIS SPECIMEN IS ACCEPTABLE FOR SPUTUM CULTURE  Final   Report Status 06/13/2015 FINAL  Final  Culture, respiratory (NON-Expectorated)     Status: None   Collection Time: 06/11/15  2:50 PM  Result Value Ref Range Status   Specimen Description SPUTUM  Final   Special Requests NONE Reflexed from [id number]  Final   Gram Stain   Final    MODERATE WBC SEEN RARE GRAM POSITIVE COCCI GOOD SPECIMEN - 80-90% WBCS    Culture MODERATE GROWTH STAPHYLOCOCCUS AUREUS  Final   Report Status 06/14/2015 FINAL  Final   Organism ID, Bacteria STAPHYLOCOCCUS AUREUS  Final      Susceptibility   Staphylococcus aureus - MIC*    CIPROFLOXACIN <=0.5 SENSITIVE Sensitive     GENTAMICIN <=0.5 SENSITIVE Sensitive     OXACILLIN 0.5 SENSITIVE Sensitive     TRIMETH/SULFA <=10 SENSITIVE Sensitive     CEFOXITIN SCREEN NEGATIVE Sensitive     Inducible Clindamycin NEGATIVE Sensitive     TETRACYCLINE Value in next row Sensitive      SENSITIVE<=1    * MODERATE GROWTH STAPHYLOCOCCUS AUREUS  Wound culture     Status: None   Collection Time: 06/13/15  1:56 PM  Result Value Ref Range Status   Specimen Description LEG  Final   Special Requests Normal  Final   Gram  Stain   Final    FEW WBC SEEN MODERATE GRAM NEGATIVE RODS FEW GRAM POSITIVE COCCI    Culture   Final    LIGHT GROWTH  STAPHYLOCOCCUS AUREUS RARE GROWTH PROTEUS PENNERI RARE GROWTH ENTEROCOCCUS FAECALIS    Report Status 06/17/2015 FINAL  Final   Organism ID, Bacteria STAPHYLOCOCCUS AUREUS  Final   Organism ID, Bacteria PROTEUS PENNERI  Final   Organism ID, Bacteria ENTEROCOCCUS FAECALIS  Final      Susceptibility   Staphylococcus aureus - MIC*    CIPROFLOXACIN <=0.5 SENSITIVE Sensitive     GENTAMICIN <=0.5 SENSITIVE Sensitive     OXACILLIN 0.5 SENSITIVE Sensitive     TRIMETH/SULFA <=10 SENSITIVE Sensitive     CEFOXITIN SCREEN NEGATIVE Sensitive     Inducible Clindamycin NEGATIVE Sensitive     TETRACYCLINE Value in next row Sensitive      SENSITIVE<=1    * LIGHT GROWTH STAPHYLOCOCCUS AUREUS   Proteus penneri - MIC*    AMPICILLIN Value in next row Resistant      SENSITIVE<=1    CEFAZOLIN Value in next row Resistant      SENSITIVE<=1    CEFTRIAXONE Value in next row Sensitive      SENSITIVE<=1    CIPROFLOXACIN Value in next row Sensitive      SENSITIVE<=1    GENTAMICIN Value in next row Sensitive      SENSITIVE<=1    IMIPENEM Value in next row Sensitive      SENSITIVE<=1    NITROFURANTOIN Value in next row Resistant      SENSITIVE<=1    TRIMETH/SULFA Value in next row Sensitive      SENSITIVE<=1    PIP/TAZO Value in next row Sensitive      SENSITIVE<=4    * RARE GROWTH PROTEUS PENNERI   Enterococcus faecalis - MIC*    AMPICILLIN Value in next row Sensitive      SENSITIVE<=4    LINEZOLID Value in next row Sensitive      SENSITIVE<=4    * RARE GROWTH ENTEROCOCCUS FAECALIS  Wound culture     Status: None   Collection Time: 06/13/15  1:58 PM  Result Value Ref Range Status   Specimen Description LEG  Final   Special Requests Normal  Final   Gram Stain RARE WBC SEEN FEW GRAM NEGATIVE RODS   Final   Culture NO GROWTH 4 DAYS  Final   Report Status 06/17/2015 FINAL  Final    Coagulation Studies: No results for input(s): LABPROT, INR in the last 72 hours.  Urinalysis: No results for  input(s): COLORURINE, LABSPEC, PHURINE, GLUCOSEU, HGBUR, BILIRUBINUR, KETONESUR, PROTEINUR, UROBILINOGEN, NITRITE, LEUKOCYTESUR in the last 72 hours.  Invalid input(s): APPERANCEUR    Imaging: Dg Chest Port 1 View  06/25/2015   CLINICAL DATA:  Respiratory failure  EXAM: PORTABLE CHEST - 1 VIEW  COMPARISON:  June 20, 2015  FINDINGS: The heart size and mediastinal contours are stable. The heart size is enlarged. Previously noted endotracheal tube and nasogastric tube have been removed. Left subclavian central venous line is unchanged. There is central pulmonary vascular congestion. There is no focal pneumonia or pleural effusion. The visualized skeletal structures are stable.  IMPRESSION: Interval removal of endotracheal tube and nasogastric tube. Mild central pulmonary vascular congestion.   Electronically Signed   By: Abelardo Diesel M.D.   On: 06/25/2015 07:24     Medications:     . sodium chloride   Intravenous Once  . famotidine  20 mg  Oral Q48H  . feeding supplement  1 Container Oral TID WC  . lactulose  20 g Oral BID  . levalbuterol      . metoprolol tartrate  50 mg Oral Q8H  . norepinephrine       ammonium lactate, anticoagulant sodium citrate, ipratropium-albuterol, LORazepam, ondansetron (ZOFRAN) IV, senna-docusate  Assessment/ Plan:  64 y.o. male with a PMHx of congestive heart failure ejection fraction 30-35%, prior pneumonia, lower extremity edema with cellulitis, atrial fibrillation who was admitted to [facility] on 06/10/2015 for evaluation of altered mental status and shortness of breath. Upon admission the patient underwent intubation with transition to a mechanical ventilation.  1. Acute renal failure/chronic kidney disease stage III with baseline creatinine 1.3/proteinuria. The patient most likely has acute renal failure secondary to altered cardiorenal hemodynamics. His ejection fraction is low at 30%. - Renal function continues to worsen.  ? If UOP accurate at present.  We  are concerned that he could develop volume overload again, weight currently is 110.9kg.  Discused this with pt.  Given low EGFR will proceed with permcath placement at this time.    2. Acute respiratory failure. From  underlying heart failure/pulmonary edema.  - continues to do well but at risk for recurrent pulmonary edema, therefore will likely need to go back on dialysis for volume removal.   3.generalized edema from  Acute systolic heart failure. EF 30-35%. - legs wrapped now, also has poor nutrition which is contributing to the issue.     LOS: [employer], Rajendra Spiller 8/30/20169:24 AM

## 2015-06-26 NOTE — Progress Notes (Signed)
[place] [facility] [place] NAME: Aaron Harrison    MR#:  [id number]  DATE OF BIRTH:  Nov 05, 1950  SUBJECTIVE:   Strength improving, breathing well, heart rate well controlled, eating    Review of Systems  Constitutional: Negative for fever, chills and weight loss.  HENT: Negative for ear discharge, ear pain and nosebleeds.   Eyes: Negative for blurred vision, pain and discharge.  Respiratory: Negative for sputum production, shortness of breath, wheezing and stridor.   Cardiovascular: Negative for chest pain, palpitations, orthopnea and PND.  Gastrointestinal: Positive for vomiting. Negative for nausea, abdominal pain and diarrhea.  Genitourinary: Negative for urgency and frequency.  Musculoskeletal: Negative for back pain and joint pain.  Neurological: Positive for weakness. Negative for sensory change, speech change and focal weakness.  Psychiatric/Behavioral: Negative for depression and hallucinations. The patient is not nervous/anxious.    DRUG ALLERGIES:   Allergies  Allergen Reactions  . Penicillins Nausea And Vomiting    VITALS:  Blood pressure 133/75, pulse 75, temperature 98.1 F (36.7 C), temperature source Oral, resp. rate 24, height 5\' 8"  (1.727 m), weight 112 kg (246 lb 14.6 oz), SpO2 98 %.  PHYSICAL EXAMINATION:   Physical Exam  GENERAL:  63 y.o.-year-old patient sitting up, eating EYES: Pupils equal, round, reactive to light and accommodation. No scleral icterus. Extraocular muscles intact.  HEENT: Head atraumatic, normocephalic. Oropharynx and nasopharynx clear.  NECK:  Supple, no jugular venous distention. No thyroid enlargement, no tenderness.  LUNGS: Normal breath sounds bilaterally, no wheezing, rales, rhonchi. No use of accessory muscles of respiration. CARDIOVASCULAR: S1, S2 normal. No murmurs, rubs, or gallops.  ABDOMEN: Soft, nontender, nondistended. Bowel sounds present. No organomegaly or mass.  -severe  scrotal edema EXTREMITIES: bilateral trace edema b/l with skin changes and thickening NEUROLOGIC: Alert, oriented 4, moves all 4 extremities strength 4 out of 5 PSYCHIATRIC: Calm, oriented SKIN:  Chronic venous stasis changes BLE's  LABORATORY PANEL:   CBC  Recent Labs Lab 06/26/15 0530  WBC 8.2  HGB 10.7*  HCT 32.8*  PLT 95*    Chemistries   Recent Labs Lab 06/22/15 0820  06/26/15 0530  NA 140  < > 137  137  K 4.3  < > 3.6  3.5  CL 105  < > 100*  101  CO2 27  < > 26  26  GLUCOSE 87  < > 92  92  BUN 33*  < > 62*  64*  CREATININE 1.80*  < > 4.69*  4.72*  CALCIUM 8.9  < > 8.1*  8.0*  MG 1.9  < > 1.8  AST 40  --   --   ALT 28  --   --   ALKPHOS 47  --   --   BILITOT 2.7*  --   --   < > = values in this interval not displayed.  Cardiac Enzymes No results for input(s): TROPONINI in the last 168 hours.  RADIOLOGY:  Dg Chest Port 1 View  06/25/2015   CLINICAL DATA:  Respiratory failure  EXAM: PORTABLE CHEST - 1 VIEW  COMPARISON:  June 20, 2015  FINDINGS: The heart size and mediastinal contours are stable. The heart size is enlarged. Previously noted endotracheal tube and nasogastric tube have been removed. Left subclavian central venous line is unchanged. There is central pulmonary vascular congestion. There is no focal pneumonia or pleural effusion. The visualized skeletal structures are stable.  IMPRESSION: Interval removal of endotracheal tube and  nasogastric tube. Mild central pulmonary vascular congestion.   Electronically Signed   By: Abelardo Diesel M.D.   On: 06/25/2015 07:24   ASSESSMENT AND PLAN:  64 y.o. male with a known history of A. fib, CHF came in with  * Acute respiratory failure with hypoxia: Resolved - intubated in ED 8/14-8/15, Reintubated on 8/17- 8/25 - currently doing well on nasal canula - CXR today with mild central venous congestion  * Sepsis with multiorgan failure: Likely hospital-acquired pneumonia. Resolved. -BC negative, wbc  stable, no fever - All anti-biotics discontinued on 8/29  * MSSA and enterococcus PNA - on  IV Unasyn-->change to po augmentin for 2-3 days now course complete  * Acute renal failure: Possible cardiorenal syndrome due to underlying CHF.  - Appreciate nephrology input , anticipate need for long-term dialysis - temporary dialysis catheter placed in the right groin and CRRT 8/17- 8/25 - UOP improving, Cr continues to rise,  - Replace PermCath today, remove groin catheter  * Acute on chronic systolic CHF : improving - Echo 06/11/15 ejection fraction 30% to 35% - CRRT with  UF on hold for now  * A. fib with RVR:  - continue BB, rate is controlled -Continue aspirin, not a candidate for anticoagulation - appreciate cardiology following  Transfer to floor with continued telemetry monitoring. Continue physical therapy. Once dialysis plan and axis are established can look for skilled nursing facility  Case discussed with Care Management/Social Worker. CODE STATUS: DNR DVT Prophylaxis: sq heparin  TOTAL Critical TIME TAKING CARE OF THIS PATIENT: 21minutes.  >50% time spent on counselling and coordination of care with RN, Dr Dwana Melena, Calah Gershman M.D on 06/26/2015 at 3:42 PM  Between 7am to 6pm - Pager - [phone]  After 6pm go to [email or web address] - password [email or web address] [facility]  [facility]  Office  [phone]  CC: Primary care physician; No PCP Per Patient

## 2015-06-27 ENCOUNTER — Encounter
Admission: EM | Disposition: A | Discharge status: Home / Self Care | Payer: Self-pay | Source: Home / Self Care | Attending: Internal Medicine

## 2015-06-27 HISTORY — PX: PERIPHERAL VASCULAR CATHETERIZATION: SHX172C

## 2015-06-27 LAB — RENAL FUNCTION PANEL
ALBUMIN: 2.8 g/dL — AB (ref 3.5–5.0)
Anion gap: 8 (ref 5–15)
BUN: 66 mg/dL — AB (ref 6–20)
CALCIUM: 7.8 mg/dL — AB (ref 8.9–10.3)
CO2: 27 mmol/L (ref 22–32)
Chloride: 101 mmol/L (ref 101–111)
Creatinine, Ser: 5.22 mg/dL — ABNORMAL HIGH (ref 0.61–1.24)
GFR calc Af Amer: 12 mL/min — ABNORMAL LOW (ref >60)
GFR calc non Af Amer: 11 mL/min — ABNORMAL LOW (ref >60)
GLUCOSE: 101 mg/dL — AB (ref 65–99)
PHOSPHORUS: 6.6 mg/dL — AB (ref 2.5–4.6)
Potassium: 3.5 mmol/L (ref 3.5–5.1)
SODIUM: 136 mmol/L (ref 135–145)

## 2015-06-27 LAB — BASIC METABOLIC PANEL
Anion gap: 7 (ref 5–15)
BUN: 69 mg/dL — ABNORMAL HIGH (ref 6–20)
CALCIUM: 7.9 mg/dL — AB (ref 8.9–10.3)
CO2: 28 mmol/L (ref 22–32)
CREATININE: 5.12 mg/dL — AB (ref 0.61–1.24)
Chloride: 102 mmol/L (ref 101–111)
GFR calc Af Amer: 13 mL/min — ABNORMAL LOW (ref >60)
GFR calc non Af Amer: 11 mL/min — ABNORMAL LOW (ref >60)
GLUCOSE: 102 mg/dL — AB (ref 65–99)
Potassium: 3.5 mmol/L (ref 3.5–5.1)
Sodium: 137 mmol/L (ref 135–145)

## 2015-06-27 LAB — HEPATIC FUNCTION PANEL
ALK PHOS: 56 U/L (ref 38–126)
ALT: 25 U/L (ref 17–63)
AST: 28 U/L (ref 15–41)
Albumin: 2.8 g/dL — ABNORMAL LOW (ref 3.5–5.0)
BILIRUBIN DIRECT: 0.8 mg/dL — AB (ref 0.1–0.5)
BILIRUBIN INDIRECT: 1.2 mg/dL — AB (ref 0.3–0.9)
Total Bilirubin: 2 mg/dL — ABNORMAL HIGH (ref 0.3–1.2)
Total Protein: 5.7 g/dL — ABNORMAL LOW (ref 6.5–8.1)

## 2015-06-27 LAB — AMMONIA: Ammonia: 24 umol/L (ref 9–35)

## 2015-06-27 LAB — MAGNESIUM: Magnesium: 1.8 mg/dL (ref 1.7–2.4)

## 2015-06-27 LAB — PHOSPHORUS: Phosphorus: 6.6 mg/dL — ABNORMAL HIGH (ref 2.5–4.6)

## 2015-06-27 SURGERY — DIALYSIS/PERMA CATHETER INSERTION
Anesthesia: Moderate Sedation

## 2015-06-27 MED ORDER — FENTANYL CITRATE (PF) 100 MCG/2ML IJ SOLN
INTRAMUSCULAR | Status: DC | PRN
  Administered 2015-06-27: 50 ug via INTRAVENOUS

## 2015-06-27 MED ORDER — FENTANYL CITRATE (PF) 100 MCG/2ML IJ SOLN
INTRAMUSCULAR | Status: AC
  Filled 2015-06-27: qty 2

## 2015-06-27 MED ORDER — ALTEPLASE 2 MG IJ SOLR
2.0000 mg | Freq: Once | INTRAMUSCULAR | Status: DC | PRN
  Filled 2015-06-27: qty 2

## 2015-06-27 MED ORDER — SODIUM CHLORIDE 0.9 % IV SOLN: 100.0000 mL | INTRAVENOUS | Status: DC | PRN

## 2015-06-27 MED ORDER — LIDOCAINE-EPINEPHRINE (PF) 1 %-1:200000 IJ SOLN
INTRAMUSCULAR | Status: AC
  Filled 2015-06-27: qty 30

## 2015-06-27 MED ORDER — LIDOCAINE-PRILOCAINE 2.5-2.5 % EX CREA: 1.0000 "application " | TOPICAL_CREAM | CUTANEOUS | Status: DC | PRN

## 2015-06-27 MED ORDER — HEPARIN (PORCINE) IN NACL 2-0.9 UNIT/ML-% IJ SOLN
INTRAMUSCULAR | Status: AC
  Filled 2015-06-27: qty 500

## 2015-06-27 MED ORDER — SODIUM CHLORIDE 0.9 % IV SOLN: INTRAVENOUS | Status: DC

## 2015-06-27 MED ORDER — HEPARIN SODIUM (PORCINE) 10000 UNIT/ML IJ SOLN
INTRAMUSCULAR | Status: AC
  Filled 2015-06-27: qty 1

## 2015-06-27 MED ORDER — LIDOCAINE HCL (PF) 1 % IJ SOLN
5.0000 mL | INTRAMUSCULAR | Status: DC | PRN
  Filled 2015-06-27: qty 5

## 2015-06-27 MED ORDER — HEPARIN SODIUM (PORCINE) 1000 UNIT/ML DIALYSIS
1000.0000 [IU] | INTRAMUSCULAR | Status: DC | PRN
Start: 2015-06-27 — End: 2015-06-28
  Filled 2015-06-27: qty 1

## 2015-06-27 MED ORDER — PENTAFLUOROPROP-TETRAFLUOROETH EX AERO: 1.0000 "application " | INHALATION_SPRAY | CUTANEOUS | Status: DC | PRN

## 2015-06-27 MED ORDER — CHLORHEXIDINE GLUCONATE CLOTH 2 % EX PADS
6.0000 | MEDICATED_PAD | Freq: Once | CUTANEOUS | Status: AC
  Administered 2015-06-27: 6 via TOPICAL

## 2015-06-27 MED ORDER — CLINDAMYCIN PHOSPHATE 300 MG/50ML IV SOLN
300.0000 mg | Freq: Once | INTRAVENOUS | Status: AC
  Administered 2015-06-27: 300 mg via INTRAVENOUS
  Filled 2015-06-27: qty 50

## 2015-06-27 MED ORDER — MIDAZOLAM HCL 2 MG/2ML IJ SOLN
INTRAMUSCULAR | Status: DC | PRN
  Administered 2015-06-27: 2 mg via INTRAVENOUS

## 2015-06-27 MED ORDER — MIDAZOLAM HCL 2 MG/2ML IJ SOLN
INTRAMUSCULAR | Status: AC
  Filled 2015-06-27: qty 2

## 2015-06-27 SURGICAL SUPPLY — 2 items
CATH PALINDROME RT-P 15FX19CM (CATHETERS) ×2 IMPLANT
PACK ANGIOGRAPHY (CUSTOM PROCEDURE TRAY) ×2 IMPLANT

## 2015-06-27 NOTE — Progress Notes (Signed)
[place] NOTE  Pharmacy Consult for Electrolytes/CRRT dose adjustment    Allergies  Allergen Reactions  . Penicillins Nausea And Vomiting    Patient Measurements: Height: 5\' 8"  (172.7 cm) Weight: 248 lb 6.4 oz (112.674 kg) IBW/kg (Calculated) : 68.4   Vital Signs: Temp: 97.8 F (36.6 C) (08/30 2309) Temp Source: Oral (08/30 2309) BP: 116/58 mmHg (08/31 0228) Pulse Rate: 81 (08/31 0228) Intake/Output from previous day: 08/30 0701 - 08/31 0700 In: 1160 [P.O.:1160] Out: 200 [Urine:200] Intake/Output from this shift: Total I/O In: 240 [P.O.:240] Out: -  Vent settings for last 24 hours:    Labs:  Recent Labs  06/25/15 0533 06/26/15 0530 06/27/15 0554 06/27/15 0555  WBC 9.2 8.2  --   --   HGB 10.0* 10.7*  --   --   HCT 30.9* 32.8*  --   --   PLT 91* 95*  --   --   CREATININE 4.62* 4.69*  4.72* 5.22* 5.12*  MG 1.8 1.8  --  1.8  PHOS  --  5.9* 6.6* 6.6*  ALBUMIN  --  2.9* 2.8* 2.8*  PROT  --   --   --  5.7*  AST  --   --   --  28  ALT  --   --   --  25  ALKPHOS  --   --   --  56  BILITOT  --   --   --  2.0*  BILIDIR  --   --   --  0.8*  IBILI  --   --   --  1.2*   Estimated Creatinine Clearance: 18.2 mL/min (by C-G formula based on Cr of 5.12).   Recent Labs  06/24/15 1113 06/24/15 1622 06/25/15 1621  GLUCAP 111* 92 89   BMP Latest Ref Rng 06/27/2015 06/27/2015 06/26/2015  Glucose 65 - 99 mg/dL 102(H) 101(H) 92  BUN 6 - 20 mg/dL 69(H) 66(H) 64(H)  Creatinine 0.61 - 1.24 mg/dL 5.12(H) 5.22(H) 4.72(H)  Sodium 135 - 145 mmol/L 137 136 137  Potassium 3.5 - 5.1 mmol/L 3.5 3.5 3.5  Chloride 101 - 111 mmol/L 102 101 101  CO2 22 - 32 mmol/L 28 27 26   Calcium 8.9 - 10.3 mg/dL 7.9(L) 7.8(L) 8.0(L)     Microbiology: Recent Results (from the past 720 hour(s))  Blood Culture (routine x 2)     Status: None   Collection Time: 06/10/15  2:35 PM  Result Value Ref Range Status   Specimen Description BLOOD RIGHT WRIST  Final   Special  Requests BOTTLES DRAWN AEROBIC AND ANAEROBIC  1CC  Final   Culture NO GROWTH 5 DAYS  Final   Report Status 06/15/2015 FINAL  Final  Blood Culture (routine x 2)     Status: None   Collection Time: 06/10/15  2:40 PM  Result Value Ref Range Status   Specimen Description BLOOD LEFT ASSIST CONTROL  Final   Special Requests   Final    BOTTLES DRAWN AEROBIC AND ANAEROBIC  AER 6CC ANA 4CC   Culture NO GROWTH 5 DAYS  Final   Report Status 06/15/2015 FINAL  Final  Urine culture     Status: None   Collection Time: 06/10/15  4:42 PM  Result Value Ref Range Status   Specimen Description URINE, RANDOM  Final   Special Requests NONE  Final   Culture 4,000 COLONIES/mL INSIGNIFICANT GROWTH  Final   Report Status 06/12/2015 FINAL  Final  MRSA PCR Screening  Status: None   Collection Time: 06/10/15  6:27 PM  Result Value Ref Range Status   MRSA by PCR NEGATIVE NEGATIVE Final    Comment:        The GeneXpert MRSA Assay (FDA approved for NASAL specimens only), is one component of a comprehensive MRSA colonization surveillance program. It is not intended to diagnose MRSA infection nor to guide or monitor treatment for MRSA infections.   Culture, sputum-assessment     Status: None   Collection Time: 06/11/15  2:50 PM  Result Value Ref Range Status   Specimen Description SPUTUM  Final   Special Requests NONE  Final   Sputum evaluation THIS SPECIMEN IS ACCEPTABLE FOR SPUTUM CULTURE  Final   Report Status 06/13/2015 FINAL  Final  Culture, respiratory (NON-Expectorated)     Status: None   Collection Time: 06/11/15  2:50 PM  Result Value Ref Range Status   Specimen Description SPUTUM  Final   Special Requests NONE Reflexed from [id number]  Final   Gram Stain   Final    MODERATE WBC SEEN RARE GRAM POSITIVE COCCI GOOD SPECIMEN - 80-90% WBCS    Culture MODERATE GROWTH STAPHYLOCOCCUS AUREUS  Final   Report Status 06/14/2015 FINAL  Final   Organism ID, Bacteria STAPHYLOCOCCUS AUREUS  Final       Susceptibility   Staphylococcus aureus - MIC*    CIPROFLOXACIN <=0.5 SENSITIVE Sensitive     GENTAMICIN <=0.5 SENSITIVE Sensitive     OXACILLIN 0.5 SENSITIVE Sensitive     TRIMETH/SULFA <=10 SENSITIVE Sensitive     CEFOXITIN SCREEN NEGATIVE Sensitive     Inducible Clindamycin NEGATIVE Sensitive     TETRACYCLINE Value in next row Sensitive      SENSITIVE<=1    * MODERATE GROWTH STAPHYLOCOCCUS AUREUS  Wound culture     Status: None   Collection Time: 06/13/15  1:56 PM  Result Value Ref Range Status   Specimen Description LEG  Final   Special Requests Normal  Final   Gram Stain   Final    FEW WBC SEEN MODERATE GRAM NEGATIVE RODS FEW GRAM POSITIVE COCCI    Culture   Final    LIGHT GROWTH STAPHYLOCOCCUS AUREUS RARE GROWTH PROTEUS PENNERI RARE GROWTH ENTEROCOCCUS FAECALIS    Report Status 06/17/2015 FINAL  Final   Organism ID, Bacteria STAPHYLOCOCCUS AUREUS  Final   Organism ID, Bacteria PROTEUS PENNERI  Final   Organism ID, Bacteria ENTEROCOCCUS FAECALIS  Final      Susceptibility   Staphylococcus aureus - MIC*    CIPROFLOXACIN <=0.5 SENSITIVE Sensitive     GENTAMICIN <=0.5 SENSITIVE Sensitive     OXACILLIN 0.5 SENSITIVE Sensitive     TRIMETH/SULFA <=10 SENSITIVE Sensitive     CEFOXITIN SCREEN NEGATIVE Sensitive     Inducible Clindamycin NEGATIVE Sensitive     TETRACYCLINE Value in next row Sensitive      SENSITIVE<=1    * LIGHT GROWTH STAPHYLOCOCCUS AUREUS   Proteus penneri - MIC*    AMPICILLIN Value in next row Resistant      SENSITIVE<=1    CEFAZOLIN Value in next row Resistant      SENSITIVE<=1    CEFTRIAXONE Value in next row Sensitive      SENSITIVE<=1    CIPROFLOXACIN Value in next row Sensitive      SENSITIVE<=1    GENTAMICIN Value in next row Sensitive      SENSITIVE<=1    IMIPENEM Value in next row Sensitive      SENSITIVE<=1  NITROFURANTOIN Value in next row Resistant      SENSITIVE<=1    TRIMETH/SULFA Value in next row Sensitive      SENSITIVE<=1     PIP/TAZO Value in next row Sensitive      SENSITIVE<=4    * RARE GROWTH PROTEUS PENNERI   Enterococcus faecalis - MIC*    AMPICILLIN Value in next row Sensitive      SENSITIVE<=4    LINEZOLID Value in next row Sensitive      SENSITIVE<=4    * RARE GROWTH ENTEROCOCCUS FAECALIS  Wound culture     Status: None   Collection Time: 06/13/15  1:58 PM  Result Value Ref Range Status   Specimen Description LEG  Final   Special Requests Normal  Final   Gram Stain RARE WBC SEEN FEW GRAM NEGATIVE RODS   Final   Culture NO GROWTH 4 DAYS  Final   Report Status 06/17/2015 FINAL  Final    Medications:  Scheduled:  . famotidine  20 mg Oral Q48H  . feeding supplement  1 Container Oral TID WC  . lactulose  20 g Oral BID  . metoprolol tartrate  50 mg Oral Q8H    Assessment: 64 y/o M with acute respiratory failure now extubated. Patient off CRRT and may begin HD tomorrow per nephrology.   Plan:  8/28 Electrolytes WNL. Labs in AM. 8/29 Calcium 7.8. 1 gram Calcium gluconate ordered x1. Recheck in AM.  Order for Augmentin 875-125mg  PO Q12H.  Transitioned to Augmentin 500-125mg  PO Q12H based on renal function as per consult.  0830 AM: calcium 8.1, albumin 2.9, corrected calcium approximately 9 (WNL). K 3.6 WNL, phos 5.9 please consider limiting dietary sources of phosphate. Will continue to monitor.  0831 AM: K 3.5, calcium 7.9 but adjusted WNL, Mg 1.8. Phos 6.6, consider limiting dietary sources of phosphate. Will continue to monitor.     Laural Benes, [facility]  Clinical Pharmacist 06/27/2015,6:35 AM

## 2015-06-27 NOTE — Progress Notes (Signed)
Post hd tx 

## 2015-06-27 NOTE — OR Nursing (Signed)
Report called to Abby on 2-C, and Tanya on Dialysis as patient is going from specials recovery to Dialysis

## 2015-06-27 NOTE — Clinical Social Work Note (Signed)
CSW spoke to Mr. Aaron Harrison, pt's HCPOA as pt continues to be confused.  CSW inquired about pt's plan to apply for disability.  Mr. Aaron Harrison explained that he was under the impression that the [facility] Financial Specialist was initiating the application.  CSW explained that she would not initiate the disability application but could give instructions as to how to apply.  CSW provided Mr. Aaron Harrison with the phone number for the [place] Specialist and encouraged him to call her regarding applying for disability.  CSW called Aaron Harrison, Patent attorney and informed her of the conversation.    CSW also informed Mr. Aaron Harrison that PT continues to recommend STR and CSW is continuing to seek placement.  CSW explained that due to pt's financial status he has not received offers in [place] or [place] and the search will need to be broadened.  CSW will continue to seek placement and assist with d/c planning needs.  [place], [place]

## 2015-06-27 NOTE — Op Note (Signed)
OPERATIVE NOTE    PRE-OPERATIVE DIAGNOSIS: 1. ESRD 2. CHF 3. Cardiorenal syndrome  POST-OPERATIVE DIAGNOSIS: same as above  PROCEDURE: 1. Ultrasound guidance for vascular access to the right internal jugular vein 2. Fluoroscopic guidance for placement of catheter 3. Placement of a 19 cm tip to cuff tunneled hemodialysis catheter via the right internal jugular vein  SURGEON: Leotis Pain, MD  ANESTHESIA:  Local/MCS  ESTIMATED BLOOD LOSS: 25 cc  FINDING(S): 1.  Patent right internal jugular vein  SPECIMEN(S):  None  INDICATIONS:   Aaron Harrison is a 64 y.o. male who presents with renal failure and possible cardiorenal syndrome.  The patient needs long term dialysis access for their ESRD, and a Permcath is necessary.  Risks and benefits are discussed and informed consent is obtained.    DESCRIPTION: After obtaining full informed written consent, the patient was brought back to the vascular suited. The patient's right neck and chest were sterilely prepped and draped in a sterile surgical field was created.  The right internal jugular vein was visualized with ultrasound and found to be patent. It was then accessed under direct ultrasound guidance and a permanent image was recorded. A wire was placed. After skin nick and dilatation, the peel-away sheath was placed over the wire. I then turned my attention to an area under the clavicle. Approximately 1-2 fingerbreadths below the clavicle a small counterincision was created and tunneled from the subclavicular incision to the access site. Using fluoroscopic guidance, a 19 centimeter tip to cuff tunneled hemodialysis catheter was selected, and tunneled from the subclavicular incision to the access site. It was then placed through the peel-away sheath and the peel-away sheath was removed. Using fluoroscopic guidance the catheter tips were parked in the right atrium. The appropriate distal connectors were placed. It withdrew blood well and flushed  easily with heparinized saline and a concentrated heparin solution was then placed. It was secured to the chest wall with 2 Prolene sutures. The access incision was closed single 4-0 Monocryl. A 4-0 Monocryl pursestring suture was placed around the exit site. Sterile dressings were placed. The patient tolerated the procedure well and was taken to the recovery room in stable condition.  COMPLICATIONS: None  CONDITION: Stable  DEW,JASON  06/27/2015, 3:26 PM

## 2015-06-27 NOTE — Progress Notes (Signed)
HD tx start 

## 2015-06-27 NOTE — Clinical Social Work Note (Signed)
CSW spoke to [place], [facility] Patient Financial Resource Specialist [facility] who explained that she has been following pt.  PFRS explained that she plans to meet with pt today to sign papers that may assist with Medicaid application.  She also explained that pt will not be able to apply for Medicaid until he applies for disability.  PFRS stated she informed pt and his HCPOA.  PFRS stated pt can initiate the disability application by phone.  CSW will meet with pt to determine whether he plans to apply for disability.  CSW will continue to follow and assist with d/c planning needs.  [place], [place]

## 2015-06-27 NOTE — Progress Notes (Signed)
Subjective:  Pt seen at bedside. Due for permcath placement today.    Objective:  Vital signs in last 24 hours:  Temp:  [97.8 F (36.6 C)-98.5 F (36.9 C)] 98.5 F (36.9 C) (08/31 1413) Pulse Rate:  [64-88] 64 (08/31 1413) Resp:  [20-24] 24 (08/31 1413) BP: (116-143)/(51-76) 143/76 mmHg (08/31 1413) SpO2:  [96 %-100 %] 99 % (08/31 1413) Weight:  [112.674 kg (248 lb 6.4 oz)] 112.674 kg (248 lb 6.4 oz) (08/31 0544)  Weight change: 0.674 kg (1 lb 7.8 oz) Filed Weights   06/25/15 0300 06/26/15 0500 06/27/15 0544  Weight: 110.9 kg (244 lb 7.8 oz) 112 kg (246 lb 14.6 oz) 112.674 kg (248 lb 6.4 oz)    Intake/Output: I/O last 3 completed shifts: In: 1160 [P.O.:1160] Out: 200 [Urine:200]   Intake/Output this shift:     Physical Exam: General: ill appearing  Head: Normocephalic, atraumatic.  ENT Moist mucus membranes hearing intact  Neck: Supple, trachea midline  Lungs:  Normal effort basilar rales  Heart: Irregular S1S2 no rubs  Abdomen:  nontender, mild distension, BS [resemt  Extremities:  + generalized edema, b/l legs wrapped.  Neurologic:  resting comfortably   Skin: ulcertaion bilateral lower extremities on shins  Access: none    Basic Metabolic Panel:  Recent Labs Lab 06/23/15 0502 06/24/15 0505 06/24/15 0506 06/25/15 0533 06/26/15 0530 06/27/15 0554 06/27/15 0555  NA 143 142  --  139 137  137 136 137  K 4.2 3.9  --  3.7 3.6  3.5 3.5 3.5  CL 106 105  --  102 100*  101 101 102  CO2 27 29  --  28 26  26 27 28   GLUCOSE 88 102*  --  101* 92  92 101* 102*  BUN 47* 57*  --  62* 62*  64* 66* 69*  CREATININE 2.97* 3.87*  --  4.62* 4.69*  4.72* 5.22* 5.12*  CALCIUM 8.6* 8.5*  --  7.8* 8.1*  8.0* 7.8* 7.9*  MG 1.9  --  2.0 1.8 1.8  --  1.8  PHOS 3.6 5.2*  --   --  5.9* 6.6* 6.6*    Liver Function Tests:  Recent Labs Lab 06/22/15 0820  06/23/15 0502 06/24/15 0505 06/26/15 0530 06/27/15 0554 06/27/15 0555  AST 40  --   --   --   --   --  28   ALT 28  --   --   --   --   --  25  ALKPHOS 47  --   --   --   --   --  56  BILITOT 2.7*  --   --   --   --   --  2.0*  PROT 6.5  --   --   --   --   --  5.7*  ALBUMIN 3.8  3.7  < > 3.4* 3.2* 2.9* 2.8* 2.8*  < > = values in this interval not displayed. No results for input(s): LIPASE, AMYLASE in the last 168 hours.  Recent Labs Lab 06/22/15 1109 06/27/15 0600  AMMONIA 53* 24    CBC:  Recent Labs Lab 06/21/15 0507 06/25/15 0533 06/26/15 0530  WBC 8.3 9.2 8.2  NEUTROABS 7.1*  --   --   HGB 11.3* 10.0* 10.7*  HCT 34.6* 30.9* 32.8*  MCV 102.3* 103.6* 102.7*  PLT 34* 91* 95*    Cardiac Enzymes: No results for input(s): CKTOTAL, CKMB, CKMBINDEX, TROPONINI in the last 168 hours.  BNP:  Invalid input(s): POCBNP  CBG:  Recent Labs Lab 06/23/15 1632 06/24/15 0717 06/24/15 1113 06/24/15 1622 06/25/15 1621  GLUCAP 115* 78 111* 92 89    Microbiology: Results for orders placed or performed during the hospital encounter of 06/10/15  Blood Culture (routine x 2)     Status: None   Collection Time: 06/10/15  2:35 PM  Result Value Ref Range Status   Specimen Description BLOOD RIGHT WRIST  Final   Special Requests BOTTLES DRAWN AEROBIC AND ANAEROBIC  [place]  Final   Culture NO GROWTH 5 DAYS  Final   Report Status 06/15/2015 FINAL  Final  Blood Culture (routine x 2)     Status: None   Collection Time: 06/10/15  2:40 PM  Result Value Ref Range Status   Specimen Description BLOOD LEFT ASSIST CONTROL  Final   Special Requests   Final    BOTTLES DRAWN AEROBIC AND ANAEROBIC  AER 6CC ANA 4CC   Culture NO GROWTH 5 DAYS  Final   Report Status 06/15/2015 FINAL  Final  Urine culture     Status: None   Collection Time: 06/10/15  4:42 PM  Result Value Ref Range Status   Specimen Description URINE, RANDOM  Final   Special Requests NONE  Final   Culture 4,000 COLONIES/mL INSIGNIFICANT GROWTH  Final   Report Status 06/12/2015 FINAL  Final  MRSA PCR Screening     Status: None    Collection Time: 06/10/15  6:27 PM  Result Value Ref Range Status   MRSA by PCR NEGATIVE NEGATIVE Final    Comment:        The GeneXpert MRSA Assay (FDA approved for NASAL specimens only), is one component of a comprehensive MRSA colonization surveillance program. It is not intended to diagnose MRSA infection nor to guide or monitor treatment for MRSA infections.   Culture, sputum-assessment     Status: None   Collection Time: 06/11/15  2:50 PM  Result Value Ref Range Status   Specimen Description SPUTUM  Final   Special Requests NONE  Final   Sputum evaluation THIS SPECIMEN IS ACCEPTABLE FOR SPUTUM CULTURE  Final   Report Status 06/13/2015 FINAL  Final  Culture, respiratory (NON-Expectorated)     Status: None   Collection Time: 06/11/15  2:50 PM  Result Value Ref Range Status   Specimen Description SPUTUM  Final   Special Requests NONE Reflexed from [id number]  Final   Gram Stain   Final    MODERATE WBC SEEN RARE GRAM POSITIVE COCCI GOOD SPECIMEN - 80-90% WBCS    Culture MODERATE GROWTH STAPHYLOCOCCUS AUREUS  Final   Report Status 06/14/2015 FINAL  Final   Organism ID, Bacteria STAPHYLOCOCCUS AUREUS  Final      Susceptibility   Staphylococcus aureus - MIC*    CIPROFLOXACIN <=0.5 SENSITIVE Sensitive     GENTAMICIN <=0.5 SENSITIVE Sensitive     OXACILLIN 0.5 SENSITIVE Sensitive     TRIMETH/SULFA <=10 SENSITIVE Sensitive     CEFOXITIN SCREEN NEGATIVE Sensitive     Inducible Clindamycin NEGATIVE Sensitive     TETRACYCLINE Value in next row Sensitive      SENSITIVE<=1    * MODERATE GROWTH STAPHYLOCOCCUS AUREUS  Wound culture     Status: None   Collection Time: 06/13/15  1:56 PM  Result Value Ref Range Status   Specimen Description LEG  Final   Special Requests Normal  Final   Gram Stain   Final    FEW WBC SEEN MODERATE GRAM  NEGATIVE RODS FEW GRAM POSITIVE COCCI    Culture   Final    LIGHT GROWTH STAPHYLOCOCCUS AUREUS RARE GROWTH PROTEUS PENNERI RARE GROWTH  ENTEROCOCCUS FAECALIS    Report Status 06/17/2015 FINAL  Final   Organism ID, Bacteria STAPHYLOCOCCUS AUREUS  Final   Organism ID, Bacteria PROTEUS PENNERI  Final   Organism ID, Bacteria ENTEROCOCCUS FAECALIS  Final      Susceptibility   Staphylococcus aureus - MIC*    CIPROFLOXACIN <=0.5 SENSITIVE Sensitive     GENTAMICIN <=0.5 SENSITIVE Sensitive     OXACILLIN 0.5 SENSITIVE Sensitive     TRIMETH/SULFA <=10 SENSITIVE Sensitive     CEFOXITIN SCREEN NEGATIVE Sensitive     Inducible Clindamycin NEGATIVE Sensitive     TETRACYCLINE Value in next row Sensitive      SENSITIVE<=1    * LIGHT GROWTH STAPHYLOCOCCUS AUREUS   Proteus penneri - MIC*    AMPICILLIN Value in next row Resistant      SENSITIVE<=1    CEFAZOLIN Value in next row Resistant      SENSITIVE<=1    CEFTRIAXONE Value in next row Sensitive      SENSITIVE<=1    CIPROFLOXACIN Value in next row Sensitive      SENSITIVE<=1    GENTAMICIN Value in next row Sensitive      SENSITIVE<=1    IMIPENEM Value in next row Sensitive      SENSITIVE<=1    NITROFURANTOIN Value in next row Resistant      SENSITIVE<=1    TRIMETH/SULFA Value in next row Sensitive      SENSITIVE<=1    PIP/TAZO Value in next row Sensitive      SENSITIVE<=4    * RARE GROWTH PROTEUS PENNERI   Enterococcus faecalis - MIC*    AMPICILLIN Value in next row Sensitive      SENSITIVE<=4    LINEZOLID Value in next row Sensitive      SENSITIVE<=4    * RARE GROWTH ENTEROCOCCUS FAECALIS  Wound culture     Status: None   Collection Time: 06/13/15  1:58 PM  Result Value Ref Range Status   Specimen Description LEG  Final   Special Requests Normal  Final   Gram Stain RARE WBC SEEN FEW GRAM NEGATIVE RODS   Final   Culture NO GROWTH 4 DAYS  Final   Report Status 06/17/2015 FINAL  Final    Coagulation Studies: No results for input(s): LABPROT, INR in the last 72 hours.  Urinalysis: No results for input(s): COLORURINE, LABSPEC, PHURINE, GLUCOSEU, HGBUR,  BILIRUBINUR, KETONESUR, PROTEINUR, UROBILINOGEN, NITRITE, LEUKOCYTESUR in the last 72 hours.  Invalid input(s): APPERANCEUR    Imaging: No results found.   Medications:   . sodium chloride     . clindamycin (CLEOCIN) IV  300 mg Intravenous Once  . famotidine  20 mg Oral Q48H  . feeding supplement  1 Container Oral TID WC  . lactulose  20 g Oral BID  . metoprolol tartrate  50 mg Oral Q8H   ammonium lactate, anticoagulant sodium citrate, ipratropium-albuterol, LORazepam, ondansetron (ZOFRAN) IV, senna-docusate  Assessment/ Plan:  64 y.o. male with a PMHx of congestive heart failure ejection fraction 30-35%, prior pneumonia, lower extremity edema with cellulitis, atrial fibrillation who was admitted to [facility] on 06/10/2015 for evaluation of altered mental status and shortness of breath. Upon admission the patient underwent intubation with transition to a mechanical ventilation.  1. Acute renal failure/chronic kidney disease stage III with baseline creatinine 1.3/proteinuria. The patient most likely  has acute renal failure secondary to altered cardiorenal hemodynamics. His ejection fraction is low at 30%. -  Renal function remains quite diminished.  Permcath being placed today, will plan for HD thereafter.    2. Acute respiratory failure. From underlying heart failure/pulmonary edema.  - Has done well post extubation, on nasal canula O2 at present.  3.generalized edema from  Acute systolic heart failure. EF 30-35%. - Will plan on UF target of 1.5kg.    LOS: [place] 8/31/20162:38 PM

## 2015-06-27 NOTE — Progress Notes (Signed)
[facility] [place] NAME: Aaron Harrison    MR#:  [id number]  DATE OF BIRTH:  Jun 05, 1951  SUBJECTIVE:   Doing well. Breathing comfortably. Tolerating diet. Intermittent confusion.    Review of Systems  Constitutional: Negative for fever, chills and weight loss.  HENT: Negative for ear discharge, ear pain and nosebleeds.   Eyes: Negative for blurred vision, pain and discharge.  Respiratory: Negative for sputum production, shortness of breath, wheezing and stridor.   Cardiovascular: Negative for chest pain, palpitations, orthopnea and PND.  Gastrointestinal: Positive for vomiting. Negative for nausea, abdominal pain and diarrhea.  Genitourinary: Negative for urgency and frequency.  Musculoskeletal: Negative for back pain and joint pain.  Neurological: Positive for weakness. Negative for sensory change, speech change and focal weakness.  Psychiatric/Behavioral: Negative for depression and hallucinations. The patient is not nervous/anxious.    DRUG ALLERGIES:   Allergies  Allergen Reactions  . Penicillins Nausea And Vomiting    VITALS:  Blood pressure 122/65, pulse 70, temperature 98 F (36.7 C), temperature source Oral, resp. rate 20, height 5\' 8"  (1.727 m), weight 112.674 kg (248 lb 6.4 oz), SpO2 98 %.  PHYSICAL EXAMINATION:   Physical Exam  GENERAL:  64 y.o.-year-old patient sitting up, no distress LUNGS: Fine bibasilar crackles, good air movement, no respiratory distress, no wheezes CARDIOVASCULAR: S1, S2 normal. No murmurs, rubs, or gallops.  ABDOMEN: Soft, nontender, nondistended. Bowel sounds present. No organomegaly or mass.  -severe scrotal edema EXTREMITIES: bilateral trace edema b/l with skin changes and thickening NEUROLOGIC: Alert, oriented 4, moves all 4 extremities strength 4 out of 5 PSYCHIATRIC: Calm, oriented SKIN:  Chronic venous stasis changes BLE's  LABORATORY PANEL:   CBC  Recent Labs Lab  06/26/15 0530  WBC 8.2  HGB 10.7*  HCT 32.8*  PLT 95*    Chemistries   Recent Labs Lab 06/27/15 0555  NA 137  K 3.5  CL 102  CO2 28  GLUCOSE 102*  BUN 69*  CREATININE 5.12*  CALCIUM 7.9*  MG 1.8  AST 28  ALT 25  ALKPHOS 56  BILITOT 2.0*    Cardiac Enzymes No results for input(s): TROPONINI in the last 168 hours.  RADIOLOGY:  No results found. ASSESSMENT AND PLAN:  64 y.o. male with a known history of A. fib, CHF came in with  * Acute respiratory failure with hypoxia: Resolved - intubated in ED 8/14-8/15, Reintubated on 8/17- 8/25 - currently doing well on nasal canula - CXR 8/29 with mild central venous congestion  * Sepsis with multiorgan failure: Likely hospital-acquired pneumonia. Resolved. -BC negative, wbc stable, no fever - All anti-biotics discontinued on 8/29  * MSSA and enterococcus PNA - on  IV Unasyn-->change to po augmentin for 2-3 days course completed 8/29  * Acute renal failure, now end-stage renal disease:  - Possible cardiorenal syndrome due to underlying CHF.  - Appreciate nephrology input , anticipate need for long-term dialysis - temporary dialysis catheter placed in the right groin and CRRT 8/17- 8/25 - Plan is for placement of Perm-Cath  * Acute on chronic systolic CHF : - Echo 03/27/08 ejection fraction 30% to 35% - CRRT with  UF on hold for now  * A. fib with RVR:  - continue BB, rate is controlled -Continue aspirin, not a candidate for anticoagulation - appreciate cardiology following    Continue physical therapy. Once dialysis plan and axis are established can look for skilled nursing facility  Case discussed with  Care Management/Social Worker. CODE STATUS: DNR DVT Prophylaxis: sq heparin  TOTAL Critical TIME TAKING CARE OF THIS PATIENT: 39minutes.  >50% time spent on counselling and coordination of care with RN, Dr Dwana Melena, CATHERINE M.D on 06/27/2015 at 2:07 PM  Between 7am to 6pm - Pager -  [phone]  After 6pm go to [email or web address] - password [email or web address] [facility]  [facility]  Office  [phone]  CC: Primary care physician; No PCP Per Patient

## 2015-06-27 NOTE — Progress Notes (Signed)
Pre-hd tx 

## 2015-06-27 NOTE — Progress Notes (Signed)
HD tx completed.

## 2015-06-27 NOTE — Progress Notes (Signed)
Physical Therapy Treatment Patient Details Name: Aaron Harrison MRN: [id number] DOB: 1951-04-06 Today's Date: 06/27/2015    History of Present Illness presented to ER secondary to weakness, AMS; admitted (and subsequently intubated) for acute hypercarbic/hypoxic respiratory failure secondary to CHF exacerbation.  Hospital course also significant for liver dysfunction, acute encephalopathy and renal failure (requiring CRRT, ended 8/25; now with R temp fem cath); successfully extubated 8/25, currently on 1L O2 via [place].    PT Comments    Patient with slight progression in tolerance to upright, gait distance this date.  Continues with heavy posterior learn; initiates correction with cuing/assist from therapist, but unable to complete indep. Continues with noted cognitive deficits--difficulty with processing, initiation and overall comprehension of verbal instruction. Scheduled for perm-cath placement this date (due to worsening renal status).   Follow Up Recommendations  SNF     Equipment Recommendations       Recommendations for Other Services       Precautions / Restrictions Precautions Precautions: Fall Restrictions Weight Bearing Restrictions: No    Mobility  Bed Mobility Overal bed mobility: Needs Assistance Bed Mobility: Supine to Sit     Supine to sit: Min assist;Mod assist     General bed mobility comments: assist for truncal elevation  Transfers Overall transfer level: Needs assistance Equipment used: Rolling walker (2 wheeled) Transfers: Sit to/from Stand Sit to Stand: Mod assist;+2 physical assistance         General transfer comment: assist for foot placement, hand placement, anterior weight translation and lift off from seating surface  Ambulation/Gait Ambulation/Gait assistance: Mod assist;+2 physical assistance Ambulation Distance (Feet): 5 Feet Assistive device: Rolling walker (2 wheeled)       General Gait Details: gait forward/backwards, mod  assist for balance throughout.  Very short, shuffling steps.  Initiates correction of posterior sway with cuing from therapist (but unable to initiate indep)   Stairs            Wheelchair Mobility    Modified Rankin (Stroke Patients Only)       Balance Overall balance assessment: Needs assistance Sitting-balance support: No upper extremity supported;Feet supported Sitting balance-Leahy Scale: Fair Sitting balance - Comments: min assist for unsupported sitting balance     Standing balance-Leahy Scale: Poor                 High Level Balance Comments: continues with posterior weight shift, but able to initiate correction this date when cued by therapist (verbal and tactile); does not initiate without instruction.    Cognition Arousal/Alertness: Awake/alert Behavior During Therapy: WFL for tasks assessed/performed Overall Cognitive Status:  (delayed processing/comprehension, poor task initiation)                      Exercises Other Exercises Other Exercises: Bilat LE seated therex, 1x15, AROM for muscular strength/endurance: ankle pumps, LAQs, marching; dynamic alternate UE reaching to promote anterior weight translation; lift-off from seating surface (emphasis on anterior weight translation). Other Exercises: Unsupported standing with RW, mod assist +2 for safety--worked on awareness and correction of posterior trunk lean.  Able to correct when cued/initiated by therapist, but unable to correct indep.    General Comments        Pertinent Vitals/Pain Pain Assessment: No/denies pain    Home Living                      Prior Function            PT  Goals (current goals can now be found in the care plan section) Acute Rehab PT Goals Patient Stated Goal: to get stronger PT Goal Formulation: With patient Time For Goal Achievement: 07/07/15 Potential to Achieve Goals: Fair Progress towards PT goals: Progressing toward goals    Frequency   Min 2X/week    PT Plan Current plan remains appropriate    Co-evaluation             End of Session Equipment Utilized During Treatment: Gait belt Activity Tolerance: Patient tolerated treatment well Patient left: in chair;with call bell/phone within reach;with chair alarm set     Time: 7741-2878 PT Time Calculation (min) (ACUTE ONLY): 23 min  Charges:  $Gait Training: 8-22 mins $Therapeutic Exercise: 8-22 mins                    G Codes:      Jaylon Boylen H. Owens Shark, PT, DPT, NCS 06/27/2015, 12:23 PM [phone]

## 2015-06-27 NOTE — Consult Note (Signed)
[facility] Consult Note  MRN : [id number]  Aaron Harrison is a 64 y.o. (17-Oct-1951) male who presents with chief complaint of  Chief Complaint  Patient presents with  . Weakness    "not acting right" per family  .  History of Present Illness: Patient admitted to the hospital several days ago with weakness and altered mental status. 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 Cardiorenal syndrome is also considered. He has severe cardiac disease with arrhythmias and congestive heart failure of her many years. He has been seen by the nephrology service and is now felt to need long-term dialysis. We are asked to place a PermCath.  Current Facility-Administered Medications  Medication Dose Route Frequency Provider Last Rate Last Dose  . 0.9 %  sodium chloride infusion   Intravenous Continuous Algernon Huxley, MD      . ammonium lactate (LAC-HYDRIN) 12 % lotion   Topical PRN Florene Glen, MD      . anticoagulant sodium citrate solution 5 mL  5 mL Intravenous PRN Dustin Flock, MD      . clindamycin (CLEOCIN) IVPB 300 mg  300 mg Intravenous Once Katha Cabal, MD      . famotidine (PEPCID) tablet 20 mg  20 mg Oral Q48H Fritzi Mandes, MD   20 mg at 06/24/15 1751  . feeding supplement (BOOST / RESOURCE BREEZE) liquid 1 Container  1 Container Oral TID WC Fritzi Mandes, MD   1 Container at 06/26/15 1700  . ipratropium-albuterol (DUONEB) 0.5-2.5 (3) MG/3ML nebulizer solution 3 mL  3 mL Nebulization Q6H PRN Vishal Mungal, MD      . lactulose (CHRONULAC) 10 GM/15ML solution 20 g  20 g Oral BID Laverle Hobby, MD   20 g at 06/26/15 2244  . LORazepam (ATIVAN) injection 0.5 mg  0.5 mg Intravenous Q6H PRN Colleen Can, MD   0.5 mg at 06/22/15 2319  . metoprolol (LOPRESSOR) tablet 50 mg  50 mg Oral Q8H Minus Breeding, MD   50 mg at 06/27/15 1037  . ondansetron (ZOFRAN) injection 4 mg  4 mg Intravenous  Q6H PRN Colleen Can, MD   4 mg at 06/23/15 0410  . senna-docusate (Senokot-S) tablet 1 tablet  1 tablet Oral BID PRN Vilinda Boehringer, MD        Past Medical History  Diagnosis Date  . Chronic atrial fibrillation     a. not on long term anticoagulation  . Pneumonia 2011  . Chronic systolic CHF (congestive heart failure)   . Cellulitis     History reviewed. No pertinent past surgical history.  Social History Social History  Substance Use Topics  . Smoking status: Never Smoker   . Smokeless tobacco: Never Used  . Alcohol Use: No  No IVDU  Family History Family History  Problem Relation Age of Onset  . Diabetes Mother   . Stroke Mother   . Heart failure Father   No bleeding disorders or clotting disorders  Allergies  Allergen Reactions  . Penicillins Nausea And Vomiting     REVIEW OF SYSTEMS (Negative unless checked)  Constitutional: [] Weight loss  [] Fever  [] Chills Cardiac: [] Chest pain   [] Chest pressure   [x] Palpitations   [x] Shortness of breath when laying flat   [] Shortness of breath at rest   [x] Shortness of breath with exertion. Vascular:  [] Pain in legs with walking   [] Pain in legs at rest   []   Pain in legs when laying flat   [] Claudication   [] Pain in feet when walking  [] Pain in feet at rest  [] Pain in feet when laying flat   [] History of DVT   [] Phlebitis   [x] Swelling in legs   [] Varicose veins   [] Non-healing ulcers Pulmonary:   [] Uses home oxygen   [] Productive cough   [] Hemoptysis   [] Wheeze  [] COPD   [] Asthma Neurologic:  [] Dizziness  [] Blackouts   [] Seizures   [] History of stroke   [] History of TIA  [] Aphasia   [] Temporary blindness   [] Dysphagia   [] Weakness or numbness in arms   [] Weakness or numbness in legs Musculoskeletal:  [] Arthritis   [] Joint swelling   [] Joint pain   [] Low back pain Hematologic:  [] Easy bruising  [] Easy bleeding   [] Hypercoagulable state   [] Anemic  [] Hepatitis Gastrointestinal:  [] Blood in stool   [] Vomiting blood   [] Gastroesophageal reflux/heartburn   [] Difficulty swallowing. Genitourinary:  [x] Chronic kidney disease   [] Difficult urination  [] Frequent urination  [] Burning with urination   [] Blood in urine Skin:  [] Rashes   [] Ulcers   [] Wounds Psychological:  [] History of anxiety   []  History of major depression.  Physical Examination  Filed Vitals:   06/27/15 0228 06/27/15 0544 06/27/15 0817 06/27/15 1413  BP: 116/58  122/65 143/76  Pulse: 81  70 64  Temp:   98 F (36.7 C) 98.5 F (36.9 C)  TempSrc:   Oral Oral  Resp:   20 24  Height:      Weight:  112.674 kg (248 lb 6.4 oz)    SpO2: 97%  98% 99%   Body mass index is 37.78 kg/(m^2). Gen:  WD/WN, NAD. obese Head: [place]/AT, No temporalis wasting. Prominent temp pulse not noted. Ear/Nose/Throat: Hearing grossly intact, nares w/o erythema or drainage, oropharynx w/o Erythema/Exudate Eyes: PERRLA, EOMI.  Neck: Supple, no nuchal rigidity.  No bruit or JVD.  Pulmonary:  Good air movement, clear to auscultation bilaterally.  Cardiac: irregularly irregular with murmur Vascular: good cap refill distally Vessel Right Left  Radial Palpable Palpable                                   Gastrointestinal: soft, non-tender/non-distended. No guarding/reflex. obese Musculoskeletal: M/S 5/5 throughout.  Extremities without ischemic changes.  No deformity or atrophy. No edema. Neurologic: CN 2-12 intact. Pain and light touch intact in extremities.  Symmetrical.  Speech is fluent. Motor exam as listed above. Psychiatric: Judgment intact, Mood & affect appropriate for pt's clinical situation. Dermatologic: No rashes or ulcers noted.  No cellulitis or open wounds. Lymph : No Cervical, Axillary, or Inguinal lymphadenopathy.    CBC Lab Results  Component Value Date   WBC 8.2 06/26/2015   HGB 10.7* 06/26/2015   HCT 32.8* 06/26/2015   MCV 102.7* 06/26/2015   PLT 95* 06/26/2015    BMET    Component Value Date/Time   NA 137 06/27/2015 0555   NA  137 07/16/2013 0239   K 3.5 06/27/2015 0555   K 3.0* 07/16/2013 0239   CL 102 06/27/2015 0555   CL 103 07/16/2013 0239   CO2 28 06/27/2015 0555   CO2 25 07/16/2013 0239   GLUCOSE 102* 06/27/2015 0555   GLUCOSE 198* 07/16/2013 0239   BUN 69* 06/27/2015 0555   BUN 19* 07/16/2013 0239   CREATININE 5.12* 06/27/2015 0555   CREATININE 1.53* 07/16/2013 0239   CALCIUM 7.9* 06/27/2015 0555  CALCIUM 8.8 07/16/2013 0239   GFRNONAA 11* 06/27/2015 0555   GFRNONAA 49* 07/16/2013 0239   GFRAA 13* 06/27/2015 0555   GFRAA 56* 07/16/2013 0239   Estimated Creatinine Clearance: 18.2 mL/min (by C-G formula based on Cr of 5.12).  COAG Lab Results  Component Value Date   INR 2.00 06/10/2015    Radiology Dg Chest 1 View  06/18/2015   CLINICAL DATA:  Acute respiratory failure, community-acquired pneumonia, intubated patient.  EXAM: CHEST  1 VIEW  COMPARISON:  Portable chest x-ray of June 17, 2015  FINDINGS: The lungs are adequately inflated. The interstitial markings remain increased with areas of confluence noted particularly on the left. The retrocardiac region remains dense and the left hemidiaphragm remains obscured. The cardiac silhouette is enlarged. The endotracheal tube tip lies approximately 1.7 cm above the carina. The esophagogastric tube tip projects below the inferior margin of the image. The left subclavian venous catheter tip projects over the midportion of the SVC.  IMPRESSION: Cardiomegaly with mild interstitial prominence bilaterally. Alveolar edema or pneumonia on the left with retrocardiac atelectasis. The support apparatus is in stable position.   Electronically Signed   By: David  Martinique M.D.   On: 06/18/2015 07:39   Dg Chest 1 View  06/17/2015   CLINICAL DATA:  Patient with acute respiratory failure and community acquired pneumonia.  EXAM: CHEST  1 VIEW  COMPARISON:  Chest radiograph 06/15/2015  FINDINGS: ET tube terminates in the distal trachea. Left subclavian central venous  catheter tip projects over the superior vena cava. Enteric tube courses inferior to the diaphragm. Multiple additional monitoring leads overlie the patient. Stable cardiomegaly. Low lung volumes. Diffuse bilateral interstitial pulmonary opacities. Small bilateral pleural effusions. No definite pneumothorax.  IMPRESSION: Stable support apparatus.  Cardiomegaly and mild interstitial edema.   Electronically Signed   By: Lovey Newcomer M.D.   On: 06/17/2015 09:15   Dg Abd 1 View  06/18/2015   CLINICAL DATA:  Followup abdominal distension  EXAM: ABDOMEN - 1 VIEW  COMPARISON:  06/17/2015  FINDINGS: A right femoral dual lumen catheter is again seen and stable. Scattered large and small bowel gas is noted. No obstructive changes are seen. Bony structures show degenerative change of the lumbar spine.  IMPRESSION: No acute abnormality noted.   Electronically Signed   By: Inez Catalina M.D.   On: 06/18/2015 11:57   Dg Abd 1 View  06/17/2015   CLINICAL DATA:  Abdominal distention.  Sepsis.  Respiratory failure.  EXAM: ABDOMEN - 1 VIEW  COMPARISON:  06/13/2015  FINDINGS: Gaseous distention is seen involving the nondependent portions of the colon, mainly the transverse colon, consistent with adynamic ileus. Cecum measures approximately 9.4 cm in diameter. No dilated small bowel loops seen. Catheter is noted within the bladder. Right femoral venous catheter also noted.  IMPRESSION: Moderate colonic ileus.   Electronically Signed   By: Earle Gell M.D.   On: 06/17/2015 08:40   Dg Abd 1 View  06/13/2015   CLINICAL DATA:  Status post OG tube placement.  EXAM: ABDOMEN - 1 VIEW  COMPARISON:  Single view of the abdomen 06/10/2015.  FINDINGS: OG tube is in place with the tip in the distal stomach. The bowel gas pattern is unremarkable.  IMPRESSION: OG tube tip is in the distal stomach.   Electronically Signed   By: Inge Rise M.D.   On: 06/13/2015 11:32   Dg Abd 1 View  06/10/2015   CLINICAL DATA:  64 year old male enteric  tube placement.  Initial encounter.  EXAM: ABDOMEN - 1 VIEW  COMPARISON:  Portable chest radiograph 1556 hours today.  FINDINGS: Portable AP supine view at 1552 hours. There is no enteric tube visible in the abdomen. There is gaseous distension of the stomach. Non obstructed bowel gas pattern otherwise. On the recent portable chest tubing was looped over the lower neck.  IMPRESSION: 1. Strongly suspect the enteric tube is looped in the pharynx, visible only on the portable chest radiograph from 1556 hours. Recommend removal and replacement. 2. Gaseous distension of the stomach.   Electronically Signed   By: Genevie Ann M.D.   On: 06/10/2015 16:32   US Abdomen Complete  06/16/2015   CLINICAL DATA:  Patient with liver and kidney disease. Respiratory failure.  EXAM: ULTRASOUND ABDOMEN COMPLETE  COMPARISON:  None.  FINDINGS: Gallbladder: Probable sludge within the gallbladder lumen. No gallbladder wall thickening. No pericholecystic fluid. Unable to assess for sonographic Murphy's sign.  Common bile duct: Diameter: Approximately 3 mm  Liver: No focal lesion identified. Within normal limits in parenchymal echogenicity.  IVC: No abnormality visualized.  Pancreas: Not visualized due to overlying bowel gas and body habitus.  Spleen: Size and appearance within normal limits.  Right Kidney: Length: 14.2 cm. No hydronephrosis. There is a 7 cm cyst off the superior pole.  Left Kidney: Length: 12.4 cm. No hydronephrosis. There is a 2.2 cm hypoechoic lesion within the interpolar region of the left kidney, incompletely characterized.  Abdominal aorta: Not well visualized.  Other findings: None.  IMPRESSION: Possible sludge within the gallbladder lumen. No definite sonographic evidence to suggest acute cholecystitis.  No hydronephrosis.  Indeterminate hypoechoic mass within the interpolar region left kidney, potentially representing a complicated cyst. Solid mass is not excluded. Consider correlation with dedicated cross-sectional  imaging when patient clinically able.   Electronically Signed   By: Lovey Newcomer M.D.   On: 06/16/2015 18:21   Dg Chest Port 1 View  06/25/2015   CLINICAL DATA:  Respiratory failure  EXAM: PORTABLE CHEST - 1 VIEW  COMPARISON:  June 20, 2015  FINDINGS: The heart size and mediastinal contours are stable. The heart size is enlarged. Previously noted endotracheal tube and nasogastric tube have been removed. Left subclavian central venous line is unchanged. There is central pulmonary vascular congestion. There is no focal pneumonia or pleural effusion. The visualized skeletal structures are stable.  IMPRESSION: Interval removal of endotracheal tube and nasogastric tube. Mild central pulmonary vascular congestion.   Electronically Signed   By: Abelardo Diesel M.D.   On: 06/25/2015 07:24   Dg Chest Port 1 View  06/20/2015   CLINICAL DATA:  Respiratory failure.  EXAM: PORTABLE CHEST - 1 VIEW  COMPARISON:  06/18/2015.  FINDINGS: Endotracheal tube, NG tube, left subclavian line in stable position. Cardiomegaly with progressive bilateral pulmonary alveolar infiltrates and pleural effusions noted. Findings consistent with progressive congestive heart failure. Bilateral pneumonia cannot be excluded. No pneumothorax.  IMPRESSION: 1. Lines and tubes in stable position. 2. Cardiomegaly with progressive bilateral pulmonary alveolar infiltrates and pleural effusions consistent congestive heart failure. Bilateral pneumonia cannot be excluded.   Electronically Signed   By: Marcello Moores  Register   On: 06/20/2015 07:19   Dg Chest Port 1 View  06/15/2015   CLINICAL DATA:  Intubated patient, acute respiratory failure, community-acquired pneumonia, CHF.  EXAM: PORTABLE CHEST - 1 VIEW  COMPARISON:  Portable chest x-ray of June 13, 2015  FINDINGS: The lungs are mildly hypoinflated. The interstitial markings are increased bilaterally. The retrocardiac region  remains dense. There is no pneumothorax or significant pleural effusion. The  cardiac silhouette remains enlarged. The pulmonary vascularity is engorged. The endotracheal tube tip lies 3.2 cm above the carina. The esophagogastric tube tip projects below the inferior margin of the image. The left subclavian venous catheter tip projects over the midportion of the SVC.  IMPRESSION: Slight interval improvement in pulmonary interstitial edema despite mild hypo inflation. The support tubes are in reasonable position.   Electronically Signed   By: David  Martinique M.D.   On: 06/15/2015 08:45   Dg Chest Port 1 View  06/13/2015   CLINICAL DATA:  Hypoxia  EXAM: PORTABLE CHEST - 1 VIEW  COMPARISON:  June 12, 2015  FINDINGS: Endotracheal tube tip is at the carina. Central catheter tip is in the superior cava. Nasogastric tube tip and side port are below the diaphragm. No pneumothorax. There is alveolar edema throughout much of the right lung. There is also alveolar edema in both lower lobes. Heart is enlarged with pulmonary venous hypertension. Suspect small layering effusion on the right. No adenopathy.  IMPRESSION: Tube and catheter positions as described without pneumothorax. Note that the endotracheal tube tip is at the carina.  Congestive heart failure with persistent areas of alveolar edema. Stable cardiomegaly. Suspect layering effusion on the right.  Critical Value/emergent results were called by telephone at the time of interpretation on 06/13/2015 at 10:17 am to Delton See, RN , who verbally acknowledged these results.   Electronically Signed   By: Lowella Grip III M.D.   On: 06/13/2015 10:18   Dg Chest Port 1 View  06/12/2015   CLINICAL DATA:  Shortness of breath.  EXAM: PORTABLE CHEST - 1 VIEW  COMPARISON:  06/11/2015.  FINDINGS: Interim removal of endotracheal tube. Left subclavian line in good anatomic position. Cardiomegaly. Progressive bilateral pulmonary infiltrates. Small left pleural effusion. No pneumothorax.  IMPRESSION: 1. Interim removal of endotracheal tube and NG tube.  Left sub plate central line in stable position. 2. Cardiomegaly with pulmonary venous congestion and significant interim progression of bilateral pulmonary infiltrates. Persistent left pleural effusion. Findings are consistent with progressive congestive heart failure. Underlying pneumonia cannot be excluded.   Electronically Signed   By: Marcello Moores  Register   On: 06/12/2015 07:41   Dg Chest Port 1 View  06/11/2015   CLINICAL DATA:  Central line placement.  Initial encounter.  EXAM: PORTABLE CHEST - 1 VIEW  COMPARISON:  Chest radiograph performed 06/10/2015  FINDINGS: The patient's endotracheal tube is seen ending 3 cm above the carina. A left subclavian line is noted ending about the distal SVC.  A small left pleural effusion is noted. Right upper lobe airspace opacification is concerning for pneumonia, similar in appearance to the recent prior study. Underlying vascular congestion is noted. No pneumothorax is seen  The cardiomediastinal silhouette is enlarged. No acute osseous abnormalities are identified.  IMPRESSION: 1. Endotracheal tube seen ending 3 cm above the carina. 2. Left subclavian line noted ending about the distal SVC. 3. Right upper lobe airspace opacification, compatible with pneumonia, similar in appearance to the prior study. 4. Small left pleural effusion noted. Cardiomegaly and vascular congestion.   Electronically Signed   By: Garald Balding M.D.   On: 06/11/2015 02:08   Dg Chest Port 1 View  06/10/2015   CLINICAL DATA:  Status post intubation  EXAM: PORTABLE CHEST - 1 VIEW  COMPARISON:  06/10/2015  FINDINGS: Cardiac shadow remains enlarged. A nasogastric catheter and endotracheal tube are again seen. The endotracheal  tube is been withdrawn and now lies in satisfactory position. Increasing consolidation within the right upper lobe is noted. Some patchy changes are noted in the left lung base as well. The left changes are stable from the recent exam.  IMPRESSION: Increasing right upper lobe  consolidation.   Electronically Signed   By: Inez Catalina M.D.   On: 06/10/2015 19:05   Dg Chest Portable 1 View  06/10/2015   ADDENDUM REPORT: 06/10/2015 16:41  ADDENDUM: Study discussed by telephone with Dr. Hinda Kehr on 06/10/2015 at 1636 hours.  We also discussed that the enteric tube appears to be looped in the pharynx/neck on this image.  He advises that both the ET tube and enteric tube have been repositioned, and a repeat abdominal film is pending.   Electronically Signed   By: Genevie Ann M.D.   On: 06/10/2015 16:41   06/10/2015   CLINICAL DATA:  64 year old male intubated. Tachypnea. Initial encounter.  EXAM: PORTABLE CHEST - 1 VIEW  COMPARISON:  1442 hours today, and earlier  FINDINGS: Portable AP supine view at 1556 hours. Endotracheal tube tip at the carina. Decreased ventilation at the left lung base. Stable cardiomegaly and mediastinal contours. Mild crowding of markings elsewhere.  IMPRESSION: 1. Intubated, endotracheal tube tip at the carina. Retract up to 3 cm 4 Mar I optimal placement. 2. Left greater than right perihilar increased atelectasis.  Electronically Signed: By: Genevie Ann M.D. On: 06/10/2015 16:31   Dg Chest Port 1 View  06/10/2015   CLINICAL DATA:  Tachypnea, shallow breathing.  EXAM: PORTABLE CHEST - 1 VIEW  COMPARISON:  April 08, 2015.  FINDINGS: Stable cardiomegaly. No pneumothorax is noted. Increased central pulmonary vascular congestion is noted. Mild bibasilar opacities are noted concerning for subsegmental atelectasis, edema or effusions. Increased right upper lobe opacity is noted concerning for possible pneumonia or edema. Bony thorax is intact.  IMPRESSION: Cardiomegaly and increased central pulmonary vascular congestion is noted, with increased right upper lobe opacity concerning for pneumonia or possibly edema. Mild bibasilar opacities are noted concerning for subsegmental atelectasis or edema with possible associated minimal pleural effusions.   Electronically Signed    By: Marijo Conception, M.D.   On: 06/10/2015 15:08   Dg Abd Portable 1v  06/22/2015   CLINICAL DATA:  Vomiting. History of CHF and pneumonia. Abdominal distention. Multi organ failure.  EXAM: PORTABLE ABDOMEN - 1 VIEW  COMPARISON:  06/18/2015  FINDINGS: Right femoral dialysis catheter is identified with tip in the projection of the lower IVC. There is marked gaseous distension of the stomach. Gas noted within small and large bowel loops.  IMPRESSION: 1. Marked gaseous distension of the stomach.   Electronically Signed   By: Kerby Moors M.D.   On: 06/22/2015 16:57   Dg Abd Portable 1v  06/10/2015   CLINICAL DATA:  OG tube placement.  EXAM: PORTABLE ABDOMEN - 1 VIEW 4:31 p.m.  COMPARISON:  06/10/2015 3:52 p.m.  FINDINGS: There is an OG tube with tip in the fundus of the stomach. Endotracheal tube appears in good position 3 cm above the carina.  No visible dilated bowel. Pulmonary infiltrates noted in the right upper lobe and left lower lobe.  IMPRESSION: OG tube tip in the fundus of the stomach.   Electronically Signed   By: Lorriane Shire M.D.   On: 06/10/2015 16:51    Assessment/Plan 1. Renal failure, now felt to be ESRD:  Needs a permcath.  Risks and benefits discussed and placed today.  Can use.  Will need to see as an outpatient in a couple of weeks with vein mapping and plan AVF or AVG. 2. Severe cardiac disease.  May be cardiorenal syndrome.  Management per primary service 3. Cardiac arrythmias.   Rate control as best possible   Avaleigh Decuir, MD  06/27/2015 3:21 PM

## 2015-06-27 NOTE — Progress Notes (Signed)
PT Cancellation Note  Patient Details Name: Aaron Harrison MRN: [id number] DOB: 1951-05-07   Cancelled Treatment:    Reason Eval/Treat Not Completed: Other (comment) (Treatment session attempted.  Patient currently in meeting with financial representative.  Will re-attempt at later time/date as patient available and medically appropriate.  Scheduled for perm-cath placement at later time this date.)   Heli Dino H. Owens Shark, PT, DPT, NCS 06/27/2015, 10:16 AM [phone]

## 2015-06-28 LAB — RENAL FUNCTION PANEL
ALBUMIN: 2.9 g/dL — AB (ref 3.5–5.0)
ANION GAP: 8 (ref 5–15)
BUN: 47 mg/dL — ABNORMAL HIGH (ref 6–20)
CO2: 29 mmol/L (ref 22–32)
Calcium: 7.9 mg/dL — ABNORMAL LOW (ref 8.9–10.3)
Chloride: 101 mmol/L (ref 101–111)
Creatinine, Ser: 4.01 mg/dL — ABNORMAL HIGH (ref 0.61–1.24)
GFR calc non Af Amer: 15 mL/min — ABNORMAL LOW (ref >60)
GFR, EST AFRICAN AMERICAN: 17 mL/min — AB (ref >60)
GLUCOSE: 132 mg/dL — AB (ref 65–99)
PHOSPHORUS: 4.4 mg/dL (ref 2.5–4.6)
POTASSIUM: 3.3 mmol/L — AB (ref 3.5–5.1)
Sodium: 138 mmol/L (ref 135–145)

## 2015-06-28 LAB — BASIC METABOLIC PANEL
ANION GAP: 10 (ref 5–15)
BUN: 49 mg/dL — ABNORMAL HIGH (ref 6–20)
CALCIUM: 7.9 mg/dL — AB (ref 8.9–10.3)
CO2: 27 mmol/L (ref 22–32)
Chloride: 101 mmol/L (ref 101–111)
Creatinine, Ser: 4.04 mg/dL — ABNORMAL HIGH (ref 0.61–1.24)
GFR, EST AFRICAN AMERICAN: 17 mL/min — AB (ref >60)
GFR, EST NON AFRICAN AMERICAN: 15 mL/min — AB (ref >60)
Glucose, Bld: 127 mg/dL — ABNORMAL HIGH (ref 65–99)
Potassium: 3.2 mmol/L — ABNORMAL LOW (ref 3.5–5.1)
SODIUM: 138 mmol/L (ref 135–145)

## 2015-06-28 LAB — HEPATITIS B SURFACE ANTIGEN: Hepatitis B Surface Ag: NEGATIVE

## 2015-06-28 LAB — PHOSPHORUS: PHOSPHORUS: 4.4 mg/dL (ref 2.5–4.6)

## 2015-06-28 LAB — MAGNESIUM: MAGNESIUM: 1.8 mg/dL (ref 1.7–2.4)

## 2015-06-28 LAB — HEPATITIS B SURFACE ANTIBODY, QUANTITATIVE: Hepatitis B-Post: <3.1 m[IU]/mL — ABNORMAL LOW (ref >9.9)

## 2015-06-28 MED ORDER — ANTICOAGULANT SODIUM CITRATE 4% (200MG/5ML) IV SOLN
5.0000 mL | Status: DC | PRN
  Filled 2015-06-28: qty 250

## 2015-06-28 NOTE — Progress Notes (Signed)
Subjective:  Pt had R IJ permcath placed yesterday. Had HD later in the day. Appears to have tolerated well.  Objective:  Vital signs in last 24 hours:  Temp:  [98 F (36.7 C)-98.7 F (37.1 C)] 98 F (36.7 C) (09/01 0810) Pulse Rate:  [26-78] 71 (09/01 0810) Resp:  [16-24] 21 (09/01 0810) BP: (94-145)/(58-78) 137/78 mmHg (09/01 0810) SpO2:  [91 %-100 %] 91 % (09/01 0810) Weight:  [111 kg (244 lb 11.4 oz)-112.447 kg (247 lb 14.4 oz)] 112.447 kg (247 lb 14.4 oz) (09/01 0256)  Weight change: -1.673 kg (-3 lb 11 oz) Filed Weights   06/27/15 0544 06/27/15 1600 06/28/15 0256  Weight: 112.674 kg (248 lb 6.4 oz) 111 kg (244 lb 11.4 oz) 112.447 kg (247 lb 14.4 oz)    Intake/Output: I/O last 3 completed shifts: In: 360 [P.O.:360] Out: 1500 [Other:1500]   Intake/Output this shift:     Physical Exam: General: ill appearing  Head: Normocephalic, atraumatic.  ENT Moist mucus membranes hearing intact  Neck: Supple, trachea midline  Lungs:  Normal effort basilar rales  Heart: Irregular S1S2 no rubs  Abdomen:  nontender, mild distension, BS [resemt  Extremities:  + generalized edema, b/l legs wrapped.  Neurologic:  resting comfortably   Skin: ulcertaion bilateral lower extremities on shins  Access: none    Basic Metabolic Panel:  Recent Labs Lab 06/24/15 0505 06/24/15 0506 06/25/15 0533 06/26/15 0530 06/27/15 0554 06/27/15 0555 06/28/15 0459  NA 142  --  139 137  137 136 137 138  138  K 3.9  --  3.7 3.6  3.5 3.5 3.5 3.2*  3.3*  CL 105  --  102 100*  101 101 102 101  101  CO2 29  --  28 26  26 27 28 27  29   GLUCOSE 102*  --  101* 92  92 101* 102* 127*  132*  BUN 57*  --  62* 62*  64* 66* 69* 49*  47*  CREATININE 3.87*  --  4.62* 4.69*  4.72* 5.22* 5.12* 4.04*  4.01*  CALCIUM 8.5*  --  7.8* 8.1*  8.0* 7.8* 7.9* 7.9*  7.9*  MG  --  2.0 1.8 1.8  --  1.8 1.8  PHOS 5.2*  --   --  5.9* 6.6* 6.6* 4.4  4.4    Liver Function Tests:  Recent Labs Lab  06/22/15 0820  06/24/15 0505 06/26/15 0530 06/27/15 0554 06/27/15 0555 06/28/15 0459  AST 40  --   --   --   --  28  --   ALT 28  --   --   --   --  25  --   ALKPHOS 47  --   --   --   --  56  --   BILITOT 2.7*  --   --   --   --  2.0*  --   PROT 6.5  --   --   --   --  5.7*  --   ALBUMIN 3.8  3.7  < > 3.2* 2.9* 2.8* 2.8* 2.9*  < > = values in this interval not displayed. No results for input(s): LIPASE, AMYLASE in the last 168 hours.  Recent Labs Lab 06/22/15 1109 06/27/15 0600  AMMONIA 53* 24    CBC:  Recent Labs Lab 06/25/15 0533 06/26/15 0530  WBC 9.2 8.2  HGB 10.0* 10.7*  HCT 30.9* 32.8*  MCV 103.6* 102.7*  PLT 91* 95*    Cardiac Enzymes: No  results for input(s): CKTOTAL, CKMB, CKMBINDEX, TROPONINI in the last 168 hours.  BNP: Invalid input(s): POCBNP  CBG:  Recent Labs Lab 06/23/15 1632 06/24/15 0717 06/24/15 1113 06/24/15 1622 06/25/15 1621  GLUCAP 115* 78 111* 92 89    Microbiology: Results for orders placed or performed during the hospital encounter of 06/10/15  Blood Culture (routine x 2)     Status: None   Collection Time: 06/10/15  2:35 PM  Result Value Ref Range Status   Specimen Description BLOOD RIGHT WRIST  Final   Special Requests BOTTLES DRAWN AEROBIC AND ANAEROBIC  [place]  Final   Culture NO GROWTH 5 DAYS  Final   Report Status 06/15/2015 FINAL  Final  Blood Culture (routine x 2)     Status: None   Collection Time: 06/10/15  2:40 PM  Result Value Ref Range Status   Specimen Description BLOOD LEFT ASSIST CONTROL  Final   Special Requests   Final    BOTTLES DRAWN AEROBIC AND ANAEROBIC  AER 6CC ANA 4CC   Culture NO GROWTH 5 DAYS  Final   Report Status 06/15/2015 FINAL  Final  Urine culture     Status: None   Collection Time: 06/10/15  4:42 PM  Result Value Ref Range Status   Specimen Description URINE, RANDOM  Final   Special Requests NONE  Final   Culture 4,000 COLONIES/mL INSIGNIFICANT GROWTH  Final   Report Status  06/12/2015 FINAL  Final  MRSA PCR Screening     Status: None   Collection Time: 06/10/15  6:27 PM  Result Value Ref Range Status   MRSA by PCR NEGATIVE NEGATIVE Final    Comment:        The GeneXpert MRSA Assay (FDA approved for NASAL specimens only), is one component of a comprehensive MRSA colonization surveillance program. It is not intended to diagnose MRSA infection nor to guide or monitor treatment for MRSA infections.   Culture, sputum-assessment     Status: None   Collection Time: 06/11/15  2:50 PM  Result Value Ref Range Status   Specimen Description SPUTUM  Final   Special Requests NONE  Final   Sputum evaluation THIS SPECIMEN IS ACCEPTABLE FOR SPUTUM CULTURE  Final   Report Status 06/13/2015 FINAL  Final  Culture, respiratory (NON-Expectorated)     Status: None   Collection Time: 06/11/15  2:50 PM  Result Value Ref Range Status   Specimen Description SPUTUM  Final   Special Requests NONE Reflexed from [id number]  Final   Gram Stain   Final    MODERATE WBC SEEN RARE GRAM POSITIVE COCCI GOOD SPECIMEN - 80-90% WBCS    Culture MODERATE GROWTH STAPHYLOCOCCUS AUREUS  Final   Report Status 06/14/2015 FINAL  Final   Organism ID, Bacteria STAPHYLOCOCCUS AUREUS  Final      Susceptibility   Staphylococcus aureus - MIC*    CIPROFLOXACIN <=0.5 SENSITIVE Sensitive     GENTAMICIN <=0.5 SENSITIVE Sensitive     OXACILLIN 0.5 SENSITIVE Sensitive     TRIMETH/SULFA <=10 SENSITIVE Sensitive     CEFOXITIN SCREEN NEGATIVE Sensitive     Inducible Clindamycin NEGATIVE Sensitive     TETRACYCLINE Value in next row Sensitive      SENSITIVE<=1    * MODERATE GROWTH STAPHYLOCOCCUS AUREUS  Wound culture     Status: None   Collection Time: 06/13/15  1:56 PM  Result Value Ref Range Status   Specimen Description LEG  Final   Special Requests Normal  Final  Gram Stain   Final    FEW WBC SEEN MODERATE GRAM NEGATIVE RODS FEW GRAM POSITIVE COCCI    Culture   Final    LIGHT GROWTH  STAPHYLOCOCCUS AUREUS RARE GROWTH PROTEUS PENNERI RARE GROWTH ENTEROCOCCUS FAECALIS    Report Status 06/17/2015 FINAL  Final   Organism ID, Bacteria STAPHYLOCOCCUS AUREUS  Final   Organism ID, Bacteria PROTEUS PENNERI  Final   Organism ID, Bacteria ENTEROCOCCUS FAECALIS  Final      Susceptibility   Staphylococcus aureus - MIC*    CIPROFLOXACIN <=0.5 SENSITIVE Sensitive     GENTAMICIN <=0.5 SENSITIVE Sensitive     OXACILLIN 0.5 SENSITIVE Sensitive     TRIMETH/SULFA <=10 SENSITIVE Sensitive     CEFOXITIN SCREEN NEGATIVE Sensitive     Inducible Clindamycin NEGATIVE Sensitive     TETRACYCLINE Value in next row Sensitive      SENSITIVE<=1    * LIGHT GROWTH STAPHYLOCOCCUS AUREUS   Proteus penneri - MIC*    AMPICILLIN Value in next row Resistant      SENSITIVE<=1    CEFAZOLIN Value in next row Resistant      SENSITIVE<=1    CEFTRIAXONE Value in next row Sensitive      SENSITIVE<=1    CIPROFLOXACIN Value in next row Sensitive      SENSITIVE<=1    GENTAMICIN Value in next row Sensitive      SENSITIVE<=1    IMIPENEM Value in next row Sensitive      SENSITIVE<=1    NITROFURANTOIN Value in next row Resistant      SENSITIVE<=1    TRIMETH/SULFA Value in next row Sensitive      SENSITIVE<=1    PIP/TAZO Value in next row Sensitive      SENSITIVE<=4    * RARE GROWTH PROTEUS PENNERI   Enterococcus faecalis - MIC*    AMPICILLIN Value in next row Sensitive      SENSITIVE<=4    LINEZOLID Value in next row Sensitive      SENSITIVE<=4    * RARE GROWTH ENTEROCOCCUS FAECALIS  Wound culture     Status: None   Collection Time: 06/13/15  1:58 PM  Result Value Ref Range Status   Specimen Description LEG  Final   Special Requests Normal  Final   Gram Stain RARE WBC SEEN FEW GRAM NEGATIVE RODS   Final   Culture NO GROWTH 4 DAYS  Final   Report Status 06/17/2015 FINAL  Final    Coagulation Studies: No results for input(s): LABPROT, INR in the last 72 hours.  Urinalysis: No results for  input(s): COLORURINE, LABSPEC, PHURINE, GLUCOSEU, HGBUR, BILIRUBINUR, KETONESUR, PROTEINUR, UROBILINOGEN, NITRITE, LEUKOCYTESUR in the last 72 hours.  Invalid input(s): APPERANCEUR    Imaging: No results found.   Medications:     . famotidine  20 mg Oral Q48H  . feeding supplement  1 Container Oral TID WC  . lactulose  20 g Oral BID  . metoprolol tartrate  50 mg Oral Q8H   sodium chloride, sodium chloride, alteplase, ammonium lactate, anticoagulant sodium citrate, heparin, ipratropium-albuterol, lidocaine (PF), lidocaine-prilocaine, LORazepam, ondansetron (ZOFRAN) IV, pentafluoroprop-tetrafluoroeth, senna-docusate  Assessment/ Plan:  64 y.o. 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 Upon admission the patient underwent intubation with transition to a mechanical ventilation.  1. Acute renal failure/chronic kidney disease stage III  with baseline creatinine 1.3/proteinuria. The patient most likely has acute renal failure secondary to altered cardiorenal hemodynamics. His ejection fraction is low at 30%. - Pt had HD yesterday, this could potentially represent ESRD but too early to tell.  Will continue HD on MWF schedule for now.    2. Acute respiratory failure. From underlying heart failure/pulmonary edema. Had period of intubation. - improved, breathing stable on nasal canula.  3.generalized edema from  Acute systolic heart failure. EF 30-35%. - will plan to perform additional UF with HD tomorrow.    LOS: 18 Aaron Harrison 9/1/20169:25 AM

## 2015-06-28 NOTE — Progress Notes (Signed)
Physical Therapy Treatment Patient Details Name: Aaron Harrison MRN: [id number] DOB: 1950-11-16 Today's Date: 06/28/2015    History of Present Illness presented to ER secondary to weakness, AMS; admitted (and subsequently intubated) for acute hypercarbic/hypoxic respiratory failure secondary to CHF exacerbation.  Hospital course also significant for liver dysfunction, acute encephalopathy and renal failure (requiring CRRT, ended 8/25; now with R temp fem cath); successfully extubated 8/25, currently on 1L O2 via [place].    PT Comments    Patient now with R IJ perm cath (placed 8/31); clean, dry and intact. Progressive increase in gait distance, with noted improvement in awareness and self-correction of balance deficits.  Patient now attempting self-correction without direct cuing from therapist; however, relies primarily on UEs vs. LE step strategy for balance recovery. Desat to 85% on 2L with exertion, recovering to >92% with 45-60 seconds of seated rest.   Follow Up Recommendations  SNF     Equipment Recommendations       Recommendations for Other Services       Precautions / Restrictions Precautions Precautions: Fall Precaution Comments: R chest perm-cath Restrictions Weight Bearing Restrictions: No    Mobility  Bed Mobility Overal bed mobility: Needs Assistance Bed Mobility: Supine to Sit Rolling: Mod assist         General bed mobility comments: assist for truncal elevation  Transfers Overall transfer level: Needs assistance Equipment used: Rolling walker (2 wheeled) Transfers: Sit to/from Stand Sit to Stand: Mod assist;+2 physical assistance         General transfer comment: assist for anterior weight translation and lift off; cuing for hand placement to avoid pulling on RW  Ambulation/Gait Ambulation/Gait assistance: Mod assist;+2 physical assistance (second person stand-by for safety) Ambulation Distance (Feet): 40 Feet Assistive device: Rolling walker (2  wheeled)       General Gait Details: broad BOS with decreased step height/length, forward flexed posture with mod WBing bilat UEs.  Decreased bilat ankle/hip strategies with external perturbations; continued high fall risk.   Stairs            Wheelchair Mobility    Modified Rankin (Stroke Patients Only)       Balance Overall balance assessment: Needs assistance Sitting-balance support: No upper extremity supported;Feet supported Sitting balance-Leahy Scale: Fair     Standing balance support: Bilateral upper extremity supported Standing balance-Leahy Scale: Fair Standing balance comment: improving awareness and initiation of self-correction this date.  Once upright, able to maintain static standing with RW, cga.                    Cognition Arousal/Alertness: Awake/alert Behavior During Therapy: WFL for tasks assessed/performed Overall Cognitive Status:  (improved command following and task initiation)                      Exercises Other Exercises Other Exercises: Sit/stand with RW x5, mod assist for anterior weight shift and lift off; patient able to establish self in midline once upright on each trial (no additional cuing from therapist required this date).  Fatigues quickly.    General Comments        Pertinent Vitals/Pain Pain Assessment: No/denies pain    Home Living                      Prior Function            PT Goals (current goals can now be found in the care plan section) Acute Rehab PT  Goals Patient Stated Goal: to get stronger PT Goal Formulation: With patient Time For Goal Achievement: 07/07/15 Potential to Achieve Goals: Fair Progress towards PT goals: Progressing toward goals    Frequency  Min 2X/week    PT Plan Current plan remains appropriate    Co-evaluation             End of Session Equipment Utilized During Treatment: Gait belt Activity Tolerance: Patient tolerated treatment well Patient  left: in bed;with call bell/phone within reach;with bed alarm set     Time: 6811-5726 PT Time Calculation (min) (ACUTE ONLY): 27 min  Charges:  $Gait Training: 8-22 mins $Therapeutic Activity: 8-22 mins                    G Codes:      Anavi Branscum H. Owens Shark, PT, DPT, NCS 06/28/2015, 12:39 PM [phone]

## 2015-06-28 NOTE — Progress Notes (Signed)
[facility]  Daily Progress Note   Subjective  - 1 Day Post-Op  Did well overnight.  No major events.  Minimal pain at catheter site.  Catheter worked well for dialysis.  Objective Filed Vitals:   06/28/15 0810 06/28/15 1230 06/28/15 1426 06/28/15 1609  BP: 137/78  132/55 127/72  Pulse: 71  66 74  Temp: 98 F (36.7 C)  98.6 F (37 C) 97.8 F (36.6 C)  TempSrc: Oral  Oral Oral  Resp: 21  20 18   Height:      Weight:      SpO2: 91% 85% 100% 100%    Intake/Output Summary (Last 24 hours) at 06/28/15 1653 Last data filed at 06/28/15 1342  Gross per 24 hour  Intake    718 ml  Output   1500 ml  Net   -782 ml    PULM  CTAB CV  RRR VASC  Catheter C/D/I  Laboratory CBC    Component Value Date/Time   WBC 8.2 06/26/2015 0530   WBC 19.5* 07/16/2013 0239   HGB 10.7* 06/26/2015 0530   HGB 15.2 07/16/2013 0239   HCT 32.8* 06/26/2015 0530   HCT 43.8 07/16/2013 0239   PLT 95* 06/26/2015 0530   PLT 179 07/16/2013 0239    BMET    Component Value Date/Time   NA 138 06/28/2015 0459   NA 138 06/28/2015 0459   NA 137 07/16/2013 0239   K 3.3* 06/28/2015 0459   K 3.2* 06/28/2015 0459   K 3.0* 07/16/2013 0239   CL 101 06/28/2015 0459   CL 101 06/28/2015 0459   CL 103 07/16/2013 0239   CO2 29 06/28/2015 0459   CO2 27 06/28/2015 0459   CO2 25 07/16/2013 0239   GLUCOSE 132* 06/28/2015 0459   GLUCOSE 127* 06/28/2015 0459   GLUCOSE 198* 07/16/2013 0239   BUN 47* 06/28/2015 0459   BUN 49* 06/28/2015 0459   BUN 19* 07/16/2013 0239   CREATININE 4.01* 06/28/2015 0459   CREATININE 4.04* 06/28/2015 0459   CREATININE 1.53* 07/16/2013 0239   CALCIUM 7.9* 06/28/2015 0459   CALCIUM 7.9* 06/28/2015 0459   CALCIUM 8.8 07/16/2013 0239   GFRNONAA 15* 06/28/2015 0459   GFRNONAA 15* 06/28/2015 0459   GFRNONAA 49* 07/16/2013 0239   GFRAA 17* 06/28/2015 0459   GFRAA 17* 06/28/2015 0459   GFRAA 56* 07/16/2013 0239    Assessment/Planning: POD #1 s/p Permcath  placement   Catheter working well without problems  Dressing is C/D/I  Would recommend outpatient evaluation for vein mapping in 2-3 weeks if renal function does not improve    Aaron Harrison  06/28/2015, 4:53 PM

## 2015-06-28 NOTE — Plan of Care (Signed)
Problem: Phase I Progression Outcomes Goal: Other Phase II Outcomes/Goals Outcome: Not Applicable Date Met:  66/81/59 No additional Phase Outcome/Goals identified at this time.

## 2015-06-28 NOTE — Care Management Note (Signed)
I have spoken with Dr. Holley Raring today in regards to patients current status AKI vs. ESRD.  He said it would be next week before he can say if outpatient dialysis will be needed at discharge.  I have started to gathered the medical records that will required for placement to outpatient center in case placement is needed. ALSO TO NOTE:  Patient has PENDING MEDICAID and I have reached out to the insurance team here at the hospital to make sure that application has been forwarded to DSS so that there is not a delay at discharge due to lack of insurance coverage for the placement to outpatient dialysis. I will monitor patients records daily for updates on his condition. [facility] Dialysis Liaison  [phone]

## 2015-06-28 NOTE — Progress Notes (Signed)
Patient ID: Aaron Harrison, male   DOB: 22-May-1951, 64 y.o.   MRN: [id number] [facility] PROGRESS NOTE  HPI/Subjective: Patient feels okay. Offers no complaints.  Objective: Filed Vitals:   06/28/15 1426  BP: 132/55  Pulse: 66  Temp: 98.6 F (37 C)  Resp: 20    Filed Weights   06/27/15 0544 06/27/15 1600 06/28/15 0256  Weight: 112.674 kg (248 lb 6.4 oz) 111 kg (244 lb 11.4 oz) 112.447 kg (247 lb 14.4 oz)    ROS: Review of Systems  Constitutional: Negative for fever and chills.  Eyes: Negative for blurred vision.  Respiratory: Negative for cough and shortness of breath.   Cardiovascular: Negative for chest pain.  Gastrointestinal: Negative for nausea, vomiting, abdominal pain, diarrhea and constipation.  Genitourinary: Negative for dysuria.  Musculoskeletal: Negative for joint pain.  Neurological: Negative for dizziness and headaches.   Exam: Physical Exam  Constitutional: He is oriented to person, place, and time.  HENT:  Nose: No mucosal edema.  Mouth/Throat: No oropharyngeal exudate or posterior oropharyngeal edema.  Eyes: Conjunctivae, EOM and lids are normal. Pupils are equal, round, and reactive to light.  Neck: No JVD present. Carotid bruit is not present. No edema present. No thyroid mass and no thyromegaly present.  Cardiovascular: S1 normal and S2 normal.  Exam reveals no gallop.   No murmur heard. Pulses:      Dorsalis pedis pulses are 2+ on the right side, and 2+ on the left side.  Respiratory: No respiratory distress. He has no wheezes. He has no rhonchi. He has no rales.  GI: Soft. Bowel sounds are normal. There is no tenderness.  Musculoskeletal:       Right ankle: He exhibits swelling.       Left ankle: He exhibits swelling.  Lymphadenopathy:    He has no cervical adenopathy.  Neurological: He is alert and oriented to person, place, and time. No cranial nerve deficit.  Skin: Skin is warm. Nails show no clubbing.  Bilateral lower  extremities covered with Unna boots  Psychiatric: He has a normal mood and affect.    Data Reviewed: Basic Metabolic Panel:  Recent Labs Lab 06/24/15 0505 06/24/15 0626 06/25/15 0533 06/26/15 0530 06/27/15 0554 06/27/15 0555 06/28/15 0459  NA 142  --  139 137  137 136 137 138  138  K 3.9  --  3.7 3.6  3.5 3.5 3.5 3.2*  3.3*  CL 105  --  102 100*  101 101 102 101  101  CO2 29  --  28 26  26 27 28 27  29   GLUCOSE 102*  --  101* 92  92 101* 102* 127*  132*  BUN 57*  --  62* 62*  64* 66* 69* 49*  47*  CREATININE 3.87*  --  4.62* 4.69*  4.72* 5.22* 5.12* 4.04*  4.01*  CALCIUM 8.5*  --  7.8* 8.1*  8.0* 7.8* 7.9* 7.9*  7.9*  MG  --  2.0 1.8 1.8  --  1.8 1.8  PHOS 5.2*  --   --  5.9* 6.6* 6.6* 4.4  4.4   Liver Function Tests:  Recent Labs Lab 06/22/15 0820  06/24/15 0505 06/26/15 0530 06/27/15 0554 06/27/15 0555 06/28/15 0459  AST 40  --   --   --   --  28  --   ALT 28  --   --   --   --  25  --   ALKPHOS 47  --   --   --   --  56  --   BILITOT 2.7*  --   --   --   --  2.0*  --   PROT 6.5  --   --   --   --  5.7*  --   ALBUMIN 3.8  3.7  < > 3.2* 2.9* 2.8* 2.8* 2.9*  < > = values in this interval not displayed. No results for input(s): LIPASE, AMYLASE in the last 168 hours.  Recent Labs Lab 06/22/15 1109 06/27/15 0600  AMMONIA 53* 24   CBC:  Recent Labs Lab 06/25/15 0533 06/26/15 0530  WBC 9.2 8.2  HGB 10.0* 10.7*  HCT 30.9* 32.8*  MCV 103.6* 102.7*  PLT 91* 95*     Scheduled Meds: . feeding supplement  1 Container Oral TID WC  . lactulose  20 g Oral BID  . metoprolol tartrate  50 mg Oral Q8H    Assessment/Plan:  1. Acute renal failure unspecified- nephrology placed a permacath but are unable to call this end-stage renal disease at this point. 2. Acute respiratory failure with hypoxia- I will check a pulse ox on room air and hopefully we can bring off the oxygen 3. Acute on chronic systolic congestive heart failure- currently lungs  are clear. Dialysis to remove fluid. Patient is on metoprolol. No ACE inhibitor at this point while trying to figure out whether his kidney function is going to get better or not. 4. Essential hypertension continue metoprolol 5. Clinical sepsis, pneumonia, bilateral lower extending cellulitis- patient finished antibiotics course here in the hospital. MSSA and enterococcus pneumonia. 6. Rapid atrial fibrillation on presentation - metoprolol for rate control, aspirin 7. Hyperkalemia on presentation- improved with dialysis   Code Status:     Code Status Orders        Start     Ordered   06/21/15 1714  Do not attempt resuscitation (DNR)   Continuous    Question Answer Comment  In the event of cardiac or respiratory ARREST Do not call a "code blue"   In the event of cardiac or respiratory ARREST Do not perform Intubation, CPR, defibrillation or ACLS   In the event of cardiac or respiratory ARREST Use medication by any route, position, wound care, and other measures to relive pain and suffering. May use oxygen, suction and manual treatment of airway obstruction as needed for comfort.   Comments Pt does not want a Permanent Feeding Tube or to be on a ventilator ever again.  He is OK with dialysis, however.      06/21/15 1714     Disposition Plan: No plans at this point.  Time spent: 20 minutes  Loletha Grayer  [facility]

## 2015-06-28 NOTE — Care Management Note (Signed)
I am monitoring patient records closely for the need for dialysis after discharge.  Today I will follow up with Dr Holley Raring to see what he thinks will be the plan at discharge.  I have started gathering records to send out patient dialysis referral. [facility] Liaison  [phone]

## 2015-06-29 ENCOUNTER — Other Ambulatory Visit: Payer: Self-pay | Admitting: Internal Medicine

## 2015-06-29 ENCOUNTER — Encounter: Payer: Self-pay | Admitting: Vascular Surgery

## 2015-06-29 LAB — RENAL FUNCTION PANEL
ALBUMIN: 3 g/dL — AB (ref 3.5–5.0)
ANION GAP: 9 (ref 5–15)
BUN: 50 mg/dL — ABNORMAL HIGH (ref 6–20)
CO2: 27 mmol/L (ref 22–32)
Calcium: 7.8 mg/dL — ABNORMAL LOW (ref 8.9–10.3)
Chloride: 99 mmol/L — ABNORMAL LOW (ref 101–111)
Creatinine, Ser: 4.46 mg/dL — ABNORMAL HIGH (ref 0.61–1.24)
GFR calc non Af Amer: 13 mL/min — ABNORMAL LOW (ref >60)
GFR, EST AFRICAN AMERICAN: 15 mL/min — AB (ref >60)
GLUCOSE: 92 mg/dL (ref 65–99)
PHOSPHORUS: 4.9 mg/dL — AB (ref 2.5–4.6)
POTASSIUM: 3.2 mmol/L — AB (ref 3.5–5.1)
Sodium: 135 mmol/L (ref 135–145)

## 2015-06-29 LAB — PLATELET COUNT: PLATELETS: 87 10*3/uL — AB (ref 150–440)

## 2015-06-29 LAB — MAGNESIUM: Magnesium: 1.7 mg/dL (ref 1.7–2.4)

## 2015-06-29 NOTE — Progress Notes (Signed)
TX completed.

## 2015-06-29 NOTE — Progress Notes (Signed)
PT Cancellation Note  Patient Details Name: CAVION FAIOLA MRN: [id number] DOB: Jul 18, 1951   Cancelled Treatment:    Reason Eval/Treat Not Completed: Patient at procedure or test/unavailable (Patient off unit for dialysis; will re-attempt at later time/date as patient available and medically appropriate.)   Mammie Meras H. Owens Shark, PT, DPT, NCS 06/29/2015, 10:25 AM [phone]

## 2015-06-29 NOTE — Progress Notes (Signed)
Post hd tx 

## 2015-06-29 NOTE — Progress Notes (Signed)
Pre-hd tx 

## 2015-06-29 NOTE — Progress Notes (Signed)
HD tx start 

## 2015-06-29 NOTE — Care Management Note (Signed)
Spoke with Amy in the financial dept at hospital this morning and she informed me that Medicaid Application is being sent to DSS today.  She is going to update me when she gets a Pending Medicaid #.

## 2015-06-29 NOTE — Care Management (Signed)
Case being followed by CSW for placement.

## 2015-06-29 NOTE — Care Management Note (Signed)
I have all medical records needed for outpatient dialysis gathered and loaded in [employer] if dialysis placement is needed at discharge. There is still no payer source for outpatient dialysis. [facility]  Dialysis Liaison  [phone]

## 2015-06-29 NOTE — Progress Notes (Signed)
Nutrition Follow-up     INTERVENTION:  Meals and snacks: Cater to pt preferences Nutritional Supplement: Will continue boost breeze TID for added nutrition  NUTRITION DIAGNOSIS:   Inadequate oral intake related to acute illness as evidenced by NPO status. Being addressed as on solid foods and supplements     GOAL:   Patient will meet greater than or equal to 90% of their needs    MONITOR:    (Energy Intake, Anthropometrics, Digestive System, Electrolyte/Renal Profile)  REASON FOR ASSESSMENT:   Consult Enteral/tube feeding initiation and management  ASSESSMENT:      Pt s/p permacath placement, per MD note unsure if will need dialysis long term.  Pt currently in dialysis at this time   Current Nutrition: Per I and O sheet intake on average 66% of meals (noted 10% - 100% of meals consumed)   Gastrointestinal Profile: Last BM: 8/31   Medications: senokot S, lactulose  Electrolyte/Renal Profile and Glucose Profile:   Recent Labs Lab 06/27/15 0555 06/28/15 0459 06/29/15 0344  NA 137 138  138 135  K 3.5 3.2*  3.3* 3.2*  CL 102 101  101 99*  CO2 28 27  29 27   BUN 69* 49*  47* 50*  CREATININE 5.12* 4.04*  4.01* 4.46*  CALCIUM 7.9* 7.9*  7.9* 7.8*  MG 1.8 1.8 1.7  PHOS 6.6* 4.4  4.4 4.9*  GLUCOSE 102* 127*  132* 92   Protein Profile:   Recent Labs Lab 06/27/15 0555 06/28/15 0459 06/29/15 0344  ALBUMIN 2.8* 2.9* 3.0*      Weight Trend since Admission: Filed Weights   06/28/15 0256 06/29/15 0435 06/29/15 0915  Weight: 247 lb 14.4 oz (112.447 kg) 247 lb 6.4 oz (112.22 kg) 245 lb 9.5 oz (111.4 kg)      Diet Order:  Diet Heart Room service appropriate?: Yes; Fluid consistency:: Thin  Skin:  Reviewed, no issues   Height:   Ht Readings from Last 1 Encounters:  06/13/15 5\' 8"  (1.727 m)    Weight:   Wt Readings from Last 1 Encounters:  06/29/15 245 lb 9.5 oz (111.4 kg)       BMI:  Body mass index is 37.35  kg/(m^2).  Estimated Nutritional Needs:   Kcal:  1750-2100   Protein:  84-105 g (1.2-1.5 g/kg)   Fluid:  1000 mL plus UOP  EDUCATION NEEDS:   No education needs identified at this time   LOW Care Level  Quinterrius Errington B. Zenia Resides, [place], [place] (pager)

## 2015-06-29 NOTE — Progress Notes (Signed)
Subjective:  Patient seen during dialysis today. Appears to be tolerating quite well at the moment. Utrafiltration target 1.5-2 kg today.  Objective:  Vital signs in last 24 hours:  Temp:  [97.8 F (36.6 C)-98.6 F (37 C)] 98.2 F (36.8 C) (09/02 0915) Pulse Rate:  [40-84] 72 (09/02 1130) Resp:  [18-32] 20 (09/02 1130) BP: (123-157)/(55-89) 126/85 mmHg (09/02 1130) SpO2:  [85 %-100 %] 100 % (09/02 1130) Weight:  [111.4 kg (245 lb 9.5 oz)-112.22 kg (247 lb 6.4 oz)] 111.4 kg (245 lb 9.5 oz) (09/02 0915)  Weight change: 1.22 kg (2 lb 11 oz) Filed Weights   06/28/15 0256 06/29/15 0435 06/29/15 0915  Weight: 112.447 kg (247 lb 14.4 oz) 112.22 kg (247 lb 6.4 oz) 111.4 kg (245 lb 9.5 oz)    Intake/Output: I/O last 3 completed shifts: In: 54 [P.O.:748] Out: 25 [Urine:25]   Intake/Output this shift:  Total I/O In: 350 [P.O.:350] Out: 0   Physical Exam: General: ill appearing  Head: Normocephalic, atraumatic.  ENT Moist mucus membranes hearing intact  Neck: Supple, trachea midline  Lungs:  Normal effort basilar rales  Heart: Irregular S1S2 no rubs  Abdomen:  nontender, mild distension, BS [resemt  Extremities:  + generalized edema  Neurologic:  resting comfortably   Skin: ulcertaion bilateral lower extremities on shins  Access: none    Basic Metabolic Panel:  Recent Labs Lab 06/25/15 0533 06/26/15 0530 06/27/15 0554 06/27/15 0555 06/28/15 0459 06/29/15 0344  NA 139 137  137 136 137 138  138 135  K 3.7 3.6  3.5 3.5 3.5 3.2*  3.3* 3.2*  CL 102 100*  101 101 102 101  101 99*  CO2 28 26  26 27 28 27  29 27   GLUCOSE 101* 92  92 101* 102* 127*  132* 92  BUN 62* 62*  64* 66* 69* 49*  47* 50*  CREATININE 4.62* 4.69*  4.72* 5.22* 5.12* 4.04*  4.01* 4.46*  CALCIUM 7.8* 8.1*  8.0* 7.8* 7.9* 7.9*  7.9* 7.8*  MG 1.8 1.8  --  1.8 1.8 1.7  PHOS  --  5.9* 6.6* 6.6* 4.4  4.4 4.9*    Liver Function Tests:  Recent Labs Lab 06/26/15 0530 06/27/15 0554  06/27/15 0555 06/28/15 0459 06/29/15 0344  AST  --   --  28  --   --   ALT  --   --  25  --   --   ALKPHOS  --   --  56  --   --   BILITOT  --   --  2.0*  --   --   PROT  --   --  5.7*  --   --   ALBUMIN 2.9* 2.8* 2.8* 2.9* 3.0*   No results for input(s): LIPASE, AMYLASE in the last 168 hours.  Recent Labs Lab 06/27/15 0600  AMMONIA 24    CBC:  Recent Labs Lab 06/25/15 0533 06/26/15 0530 06/29/15 0344  WBC 9.2 8.2  --   HGB 10.0* 10.7*  --   HCT 30.9* 32.8*  --   MCV 103.6* 102.7*  --   PLT 91* 95* 87*    Cardiac Enzymes: No results for input(s): CKTOTAL, CKMB, CKMBINDEX, TROPONINI in the last 168 hours.  BNP: Invalid input(s): POCBNP  CBG:  Recent Labs Lab 06/23/15 1632 06/24/15 0717 06/24/15 1113 06/24/15 1622 06/25/15 1621  GLUCAP 115* 78 111* 92 89    Microbiology: Results for orders placed or performed during the hospital  encounter of 06/10/15  Blood Culture (routine x 2)     Status: None   Collection Time: 06/10/15  2:35 PM  Result Value Ref Range Status   Specimen Description BLOOD RIGHT WRIST  Final   Special Requests BOTTLES DRAWN AEROBIC AND ANAEROBIC  1CC  Final   Culture NO GROWTH 5 DAYS  Final   Report Status 06/15/2015 FINAL  Final  Blood Culture (routine x 2)     Status: None   Collection Time: 06/10/15  2:40 PM  Result Value Ref Range Status   Specimen Description BLOOD LEFT ASSIST CONTROL  Final   Special Requests   Final    BOTTLES DRAWN AEROBIC AND ANAEROBIC  AER 6CC ANA 4CC   Culture NO GROWTH 5 DAYS  Final   Report Status 06/15/2015 FINAL  Final  Urine culture     Status: None   Collection Time: 06/10/15  4:42 PM  Result Value Ref Range Status   Specimen Description URINE, RANDOM  Final   Special Requests NONE  Final   Culture 4,000 COLONIES/mL INSIGNIFICANT GROWTH  Final   Report Status 06/12/2015 FINAL  Final  MRSA PCR Screening     Status: None   Collection Time: 06/10/15  6:27 PM  Result Value Ref Range Status    MRSA by PCR NEGATIVE NEGATIVE Final    Comment:        The GeneXpert MRSA Assay (FDA approved for NASAL specimens only), is one component of a comprehensive MRSA colonization surveillance program. It is not intended to diagnose MRSA infection nor to guide or monitor treatment for MRSA infections.   Culture, sputum-assessment     Status: None   Collection Time: 06/11/15  2:50 PM  Result Value Ref Range Status   Specimen Description SPUTUM  Final   Special Requests NONE  Final   Sputum evaluation THIS SPECIMEN IS ACCEPTABLE FOR SPUTUM CULTURE  Final   Report Status 06/13/2015 FINAL  Final  Culture, respiratory (NON-Expectorated)     Status: None   Collection Time: 06/11/15  2:50 PM  Result Value Ref Range Status   Specimen Description SPUTUM  Final   Special Requests NONE Reflexed from [id number]  Final   Gram Stain   Final    MODERATE WBC SEEN RARE GRAM POSITIVE COCCI GOOD SPECIMEN - 80-90% WBCS    Culture MODERATE GROWTH STAPHYLOCOCCUS AUREUS  Final   Report Status 06/14/2015 FINAL  Final   Organism ID, Bacteria STAPHYLOCOCCUS AUREUS  Final      Susceptibility   Staphylococcus aureus - MIC*    CIPROFLOXACIN <=0.5 SENSITIVE Sensitive     GENTAMICIN <=0.5 SENSITIVE Sensitive     OXACILLIN 0.5 SENSITIVE Sensitive     TRIMETH/SULFA <=10 SENSITIVE Sensitive     CEFOXITIN SCREEN NEGATIVE Sensitive     Inducible Clindamycin NEGATIVE Sensitive     TETRACYCLINE Value in next row Sensitive      SENSITIVE<=1    * MODERATE GROWTH STAPHYLOCOCCUS AUREUS  Wound culture     Status: None   Collection Time: 06/13/15  1:56 PM  Result Value Ref Range Status   Specimen Description LEG  Final   Special Requests Normal  Final   Gram Stain   Final    FEW WBC SEEN MODERATE GRAM NEGATIVE RODS FEW GRAM POSITIVE COCCI    Culture   Final    LIGHT GROWTH STAPHYLOCOCCUS AUREUS RARE GROWTH PROTEUS PENNERI RARE GROWTH ENTEROCOCCUS FAECALIS    Report Status 06/17/2015 FINAL  Final  Organism ID,  Bacteria STAPHYLOCOCCUS AUREUS  Final   Organism ID, Bacteria PROTEUS PENNERI  Final   Organism ID, Bacteria ENTEROCOCCUS FAECALIS  Final      Susceptibility   Staphylococcus aureus - MIC*    CIPROFLOXACIN <=0.5 SENSITIVE Sensitive     GENTAMICIN <=0.5 SENSITIVE Sensitive     OXACILLIN 0.5 SENSITIVE Sensitive     TRIMETH/SULFA <=10 SENSITIVE Sensitive     CEFOXITIN SCREEN NEGATIVE Sensitive     Inducible Clindamycin NEGATIVE Sensitive     TETRACYCLINE Value in next row Sensitive      SENSITIVE<=1    * LIGHT GROWTH STAPHYLOCOCCUS AUREUS   Proteus penneri - MIC*    AMPICILLIN Value in next row Resistant      SENSITIVE<=1    CEFAZOLIN Value in next row Resistant      SENSITIVE<=1    CEFTRIAXONE Value in next row Sensitive      SENSITIVE<=1    CIPROFLOXACIN Value in next row Sensitive      SENSITIVE<=1    GENTAMICIN Value in next row Sensitive      SENSITIVE<=1    IMIPENEM Value in next row Sensitive      SENSITIVE<=1    NITROFURANTOIN Value in next row Resistant      SENSITIVE<=1    TRIMETH/SULFA Value in next row Sensitive      SENSITIVE<=1    PIP/TAZO Value in next row Sensitive      SENSITIVE<=4    * RARE GROWTH PROTEUS PENNERI   Enterococcus faecalis - MIC*    AMPICILLIN Value in next row Sensitive      SENSITIVE<=4    LINEZOLID Value in next row Sensitive      SENSITIVE<=4    * RARE GROWTH ENTEROCOCCUS FAECALIS  Wound culture     Status: None   Collection Time: 06/13/15  1:58 PM  Result Value Ref Range Status   Specimen Description LEG  Final   Special Requests Normal  Final   Gram Stain RARE WBC SEEN FEW GRAM NEGATIVE RODS   Final   Culture NO GROWTH 4 DAYS  Final   Report Status 06/17/2015 FINAL  Final    Coagulation Studies: No results for input(s): LABPROT, INR in the last 72 hours.  Urinalysis: No results for input(s): COLORURINE, LABSPEC, PHURINE, GLUCOSEU, HGBUR, BILIRUBINUR, KETONESUR, PROTEINUR, UROBILINOGEN, NITRITE, LEUKOCYTESUR in the last 72  hours.  Invalid input(s): APPERANCEUR    Imaging: No results found.   Medications:     . feeding supplement  1 Container Oral TID WC  . lactulose  20 g Oral BID  . metoprolol tartrate  50 mg Oral Q8H   sodium chloride, sodium chloride, alteplase, ammonium lactate, anticoagulant sodium citrate, ipratropium-albuterol, lidocaine (PF), lidocaine-prilocaine, LORazepam, ondansetron (ZOFRAN) IV, pentafluoroprop-tetrafluoroeth, senna-docusate  Assessment/ Plan:  64 y.o. male with a PMHx of congestive heart failure ejection fraction 30-35%, prior pneumonia, lower extremity edema with cellulitis, atrial fibrillation who was admitted to [facility] on 06/10/2015 for evaluation of altered mental status and shortness of breath. Upon admission the patient underwent intubation with transition to a mechanical ventilation.  1. Acute renal failure/chronic kidney disease stage III with baseline creatinine 1.3/proteinuria. The patient most likely has acute renal failure secondary to altered cardiorenal hemodynamics. His ejection fraction is low at 30%. -as before the patient may have underlying end-stage renal disease however still a bit early to determine this.  If renal failure persists into next week we may consider him as having end-stage renal disease. Patient seen  during dialysis today and appears to be tolerating this quite well.  We will plan for dialysis again on Monday.   2. Acute respiratory failure. From underlying heart failure/pulmonary edema. Had period of intubation. -much improved as compared to his ICU stay.  Continue to monitor respiratory status closely.  Ultrafiltration should continue to help his cardiopulmonary status.Marland Kitchen  3.generalized edema from  Acute systolic heart failure. EF 30-35%. -ultrafiltration target 1.5-2 kg grams today.   LOS: [place], Maloni Musleh 9/2/201612:02 PM

## 2015-06-29 NOTE — Progress Notes (Signed)
Patient ID: Aaron Harrison, male   DOB: Mar 01, 1951, 64 y.o.   MRN: [id number] [facility] PROGRESS NOTE  HPI/Subjective: Patient feels okay. Offers no complaints. States his breathing is fine   Objective: Filed Vitals:   06/29/15 1211  BP: 157/78  Pulse: 78  Temp: 98 F (36.7 C)  Resp: 18    Filed Weights   06/29/15 0435 06/29/15 0915 06/29/15 1211  Weight: 112.22 kg (247 lb 6.4 oz) 111.4 kg (245 lb 9.5 oz) 108.8 kg (239 lb 13.8 oz)    ROS: Review of Systems  Constitutional: Negative for fever and chills.  Eyes: Negative for blurred vision.  Respiratory: Negative for cough and shortness of breath.   Cardiovascular: Negative for chest pain.  Gastrointestinal: Negative for nausea, vomiting, abdominal pain, diarrhea and constipation.  Genitourinary: Negative for dysuria.  Musculoskeletal: Negative for joint pain.  Neurological: Negative for dizziness and headaches.   Exam: Physical Exam  Constitutional: He is oriented to person, place, and time.  HENT:  Nose: No mucosal edema.  Mouth/Throat: No oropharyngeal exudate or posterior oropharyngeal edema.  Eyes: Conjunctivae, EOM and lids are normal. Pupils are equal, round, and reactive to light.  Neck: No JVD present. Carotid bruit is not present. No edema present. No thyroid mass and no thyromegaly present.  Cardiovascular: S1 normal and S2 normal.  Exam reveals no gallop.   No murmur heard. Pulses:      Dorsalis pedis pulses are 2+ on the right side, and 2+ on the left side.  Respiratory: No respiratory distress. He has no wheezes. He has no rhonchi. He has no rales.  GI: Soft. Bowel sounds are normal. There is no tenderness.  Musculoskeletal:       Right ankle: He exhibits swelling.       Left ankle: He exhibits swelling.  Lymphadenopathy:    He has no cervical adenopathy.  Neurological: He is alert and oriented to person, place, and time. No cranial nerve deficit.  Skin: Skin is warm. Nails show no  clubbing.  Bilateral lower extremities covered with Unna boots  Psychiatric: He has a normal mood and affect.    Data Reviewed: Basic Metabolic Panel:  Recent Labs Lab 06/25/15 0533 06/26/15 0530 06/27/15 0554 06/27/15 0555 06/28/15 0459 06/29/15 0344  NA 139 137  137 136 137 138  138 135  K 3.7 3.6  3.5 3.5 3.5 3.2*  3.3* 3.2*  CL 102 100*  101 101 102 101  101 99*  CO2 28 26  26 27 28 27  29 27   GLUCOSE 101* 92  92 101* 102* 127*  132* 92  BUN 62* 62*  64* 66* 69* 49*  47* 50*  CREATININE 4.62* 4.69*  4.72* 5.22* 5.12* 4.04*  4.01* 4.46*  CALCIUM 7.8* 8.1*  8.0* 7.8* 7.9* 7.9*  7.9* 7.8*  MG 1.8 1.8  --  1.8 1.8 1.7  PHOS  --  5.9* 6.6* 6.6* 4.4  4.4 4.9*   Liver Function Tests:  Recent Labs Lab 06/26/15 0530 06/27/15 0554 06/27/15 0555 06/28/15 0459 06/29/15 0344  AST  --   --  28  --   --   ALT  --   --  25  --   --   ALKPHOS  --   --  56  --   --   BILITOT  --   --  2.0*  --   --   PROT  --   --  5.7*  --   --  ALBUMIN 2.9* 2.8* 2.8* 2.9* 3.0*   No results for input(s): LIPASE, AMYLASE in the last 168 hours.  Recent Labs Lab 06/27/15 0600  AMMONIA 24   CBC:  Recent Labs Lab 06/25/15 0533 06/26/15 0530 06/29/15 0344  WBC 9.2 8.2  --   HGB 10.0* 10.7*  --   HCT 30.9* 32.8*  --   MCV 103.6* 102.7*  --   PLT 91* 95* 87*     Scheduled Meds: . feeding supplement  1 Container Oral TID WC  . lactulose  20 g Oral BID  . metoprolol tartrate  50 mg Oral Q8H    Assessment/Plan:  1. Acute renal failure unspecified- nephrology placed a permacath but are unable to call this end-stage renal disease at this point. Patient is status post dialysis today 2. Acute respiratory failure with hypoxia- patient still on 2 L nasal cannula 3. Acute on chronic systolic congestive heart failure- currently lungs are clear. Dialysis to remove fluid. Patient is on metoprolol. No ACE inhibitor at this point while trying to figure out whether his kidney  function is going to get better or not. 4. Essential hypertension continue metoprolol 5. Clinical sepsis, pneumonia, bilateral lower extending cellulitis- patient finished antibiotics course here in the hospital. MSSA and enterococcus pneumonia. 6. Rapid atrial fibrillation on presentation - metoprolol for rate control, aspirin 7. Hyperkalemia on presentation- improved with dialysis   Code Status:     Code Status Orders        Start     Ordered   06/21/15 1714  Do not attempt resuscitation (DNR)   Continuous    Question Answer Comment  In the event of cardiac or respiratory ARREST Do not call a "code blue"   In the event of cardiac or respiratory ARREST Do not perform Intubation, CPR, defibrillation or ACLS   In the event of cardiac or respiratory ARREST Use medication by any route, position, wound care, and other measures to relive pain and suffering. May use oxygen, suction and manual treatment of airway obstruction as needed for comfort.   Comments Pt does not want a Permanent Feeding Tube or to be on a ventilator ever again.  He is OK with dialysis, however.      06/21/15 1714     Disposition Plan: No plans at this point.  Time spent: 20 minutes  Loletha Grayer  [facility]

## 2015-06-30 LAB — MAGNESIUM: Magnesium: 1.8 mg/dL (ref 1.7–2.4)

## 2015-06-30 LAB — RENAL FUNCTION PANEL
ALBUMIN: 3 g/dL — AB (ref 3.5–5.0)
Anion gap: 7 (ref 5–15)
BUN: 36 mg/dL — AB (ref 6–20)
CHLORIDE: 102 mmol/L (ref 101–111)
CO2: 29 mmol/L (ref 22–32)
Calcium: 7.8 mg/dL — ABNORMAL LOW (ref 8.9–10.3)
Creatinine, Ser: 3.5 mg/dL — ABNORMAL HIGH (ref 0.61–1.24)
GFR calc Af Amer: 20 mL/min — ABNORMAL LOW (ref >60)
GFR calc non Af Amer: 17 mL/min — ABNORMAL LOW (ref >60)
GLUCOSE: 93 mg/dL (ref 65–99)
POTASSIUM: 3 mmol/L — AB (ref 3.5–5.1)
Phosphorus: 3.7 mg/dL (ref 2.5–4.6)
Sodium: 138 mmol/L (ref 135–145)

## 2015-06-30 NOTE — Progress Notes (Signed)
Subjective:   Hemodialysis yesterday. Tolerated treatment well. UF of 2 litres.  Today denies uremic symptoms. Continues to have peripheral edema  Objective:  Vital signs in last 24 hours:  Temp:  [97.6 F (36.4 C)-99 F (37.2 C)] 98.4 F (36.9 C) (09/03 0852) Pulse Rate:  [47-88] 84 (09/03 0852) Resp:  [18-20] 20 (09/03 0852) BP: (142-177)/(67-91) 177/91 mmHg (09/03 0852) SpO2:  [88 %-100 %] 88 % (09/03 0852) Weight:  [108.8 kg (239 lb 13.8 oz)-109.7 kg (241 lb 13.5 oz)] 109.7 kg (241 lb 13.5 oz) (09/03 0500)  Weight change: -0.82 kg (-1 lb 12.9 oz) Filed Weights   06/29/15 0915 06/29/15 1211 06/30/15 0500  Weight: 111.4 kg (245 lb 9.5 oz) 108.8 kg (239 lb 13.8 oz) 109.7 kg (241 lb 13.5 oz)    Intake/Output: I/O last 3 completed shifts: In: 380 [P.O.:380] Out: 2050 [Urine:50; Other:2000]   Intake/Output this shift:     Physical Exam: General: ill appearing  Head: Normocephalic, atraumatic.  ENT Moist mucus membranes hearing intact  Neck: Supple, trachea midline  Lungs:  Normal effort basilar rales  Heart: Irregular S1S2 no rubs  Abdomen:  nontender, mild distension, BS [resemt  Extremities:  + generalized edema  Neurologic:  resting comfortably   Skin: ulcertaion bilateral lower extremities on shins - in UNNA boots  Access: RIJ permcath 8/31 Dr. Lucky Cowboy    Basic Metabolic Panel:  Recent Labs Lab 06/26/15 0530 06/27/15 0554 06/27/15 0555 06/28/15 0459 06/29/15 0344 06/30/15 0500  NA 137  137 136 137 138  138 135 138  K 3.6  3.5 3.5 3.5 3.2*  3.3* 3.2* 3.0*  CL 100*  101 101 102 101  101 99* 102  CO2 26  26 27 28 27  29 27 29   GLUCOSE 92  92 101* 102* 127*  132* 92 93  BUN 62*  64* 66* 69* 49*  47* 50* 36*  CREATININE 4.69*  4.72* 5.22* 5.12* 4.04*  4.01* 4.46* 3.50*  CALCIUM 8.1*  8.0* 7.8* 7.9* 7.9*  7.9* 7.8* 7.8*  MG 1.8  --  1.8 1.8 1.7 1.8  PHOS 5.9* 6.6* 6.6* 4.4  4.4 4.9* 3.7    Liver Function Tests:  Recent Labs Lab  06/27/15 0554 06/27/15 0555 06/28/15 0459 06/29/15 0344 06/30/15 0500  AST  --  28  --   --   --   ALT  --  25  --   --   --   ALKPHOS  --  56  --   --   --   BILITOT  --  2.0*  --   --   --   PROT  --  5.7*  --   --   --   ALBUMIN 2.8* 2.8* 2.9* 3.0* 3.0*   No results for input(s): LIPASE, AMYLASE in the last 168 hours.  Recent Labs Lab 06/27/15 0600  AMMONIA 24    CBC:  Recent Labs Lab 06/25/15 0533 06/26/15 0530 06/29/15 0344  WBC 9.2 8.2  --   HGB 10.0* 10.7*  --   HCT 30.9* 32.8*  --   MCV 103.6* 102.7*  --   PLT 91* 95* 87*    Cardiac Enzymes: No results for input(s): CKTOTAL, CKMB, CKMBINDEX, TROPONINI in the last 168 hours.  BNP: Invalid input(s): POCBNP  CBG:  Recent Labs Lab 06/23/15 1632 06/24/15 0717 06/24/15 1113 06/24/15 1622 06/25/15 1621  GLUCAP 115* 78 111* 92 89    Microbiology: Results for orders placed or performed during  the hospital encounter of 06/10/15  Blood Culture (routine x 2)     Status: None   Collection Time: 06/10/15  2:35 PM  Result Value Ref Range Status   Specimen Description BLOOD RIGHT WRIST  Final   Special Requests BOTTLES DRAWN AEROBIC AND ANAEROBIC  1CC  Final   Culture NO GROWTH 5 DAYS  Final   Report Status 06/15/2015 FINAL  Final  Blood Culture (routine x 2)     Status: None   Collection Time: 06/10/15  2:40 PM  Result Value Ref Range Status   Specimen Description BLOOD LEFT ASSIST CONTROL  Final   Special Requests   Final    BOTTLES DRAWN AEROBIC AND ANAEROBIC  AER 6CC ANA 4CC   Culture NO GROWTH 5 DAYS  Final   Report Status 06/15/2015 FINAL  Final  Urine culture     Status: None   Collection Time: 06/10/15  4:42 PM  Result Value Ref Range Status   Specimen Description URINE, RANDOM  Final   Special Requests NONE  Final   Culture 4,000 COLONIES/mL INSIGNIFICANT GROWTH  Final   Report Status 06/12/2015 FINAL  Final  MRSA PCR Screening     Status: None   Collection Time: 06/10/15  6:27 PM   Result Value Ref Range Status   MRSA by PCR NEGATIVE NEGATIVE Final    Comment:        The GeneXpert MRSA Assay (FDA approved for NASAL specimens only), is one component of a comprehensive MRSA colonization surveillance program. It is not intended to diagnose MRSA infection nor to guide or monitor treatment for MRSA infections.   Culture, sputum-assessment     Status: None   Collection Time: 06/11/15  2:50 PM  Result Value Ref Range Status   Specimen Description SPUTUM  Final   Special Requests NONE  Final   Sputum evaluation THIS SPECIMEN IS ACCEPTABLE FOR SPUTUM CULTURE  Final   Report Status 06/13/2015 FINAL  Final  Culture, respiratory (NON-Expectorated)     Status: None   Collection Time: 06/11/15  2:50 PM  Result Value Ref Range Status   Specimen Description SPUTUM  Final   Special Requests NONE Reflexed from [id number]  Final   Gram Stain   Final    MODERATE WBC SEEN RARE GRAM POSITIVE COCCI GOOD SPECIMEN - 80-90% WBCS    Culture MODERATE GROWTH STAPHYLOCOCCUS AUREUS  Final   Report Status 06/14/2015 FINAL  Final   Organism ID, Bacteria STAPHYLOCOCCUS AUREUS  Final      Susceptibility   Staphylococcus aureus - MIC*    CIPROFLOXACIN <=0.5 SENSITIVE Sensitive     GENTAMICIN <=0.5 SENSITIVE Sensitive     OXACILLIN 0.5 SENSITIVE Sensitive     TRIMETH/SULFA <=10 SENSITIVE Sensitive     CEFOXITIN SCREEN NEGATIVE Sensitive     Inducible Clindamycin NEGATIVE Sensitive     TETRACYCLINE Value in next row Sensitive      SENSITIVE<=1    * MODERATE GROWTH STAPHYLOCOCCUS AUREUS  Wound culture     Status: None   Collection Time: 06/13/15  1:56 PM  Result Value Ref Range Status   Specimen Description LEG  Final   Special Requests Normal  Final   Gram Stain   Final    FEW WBC SEEN MODERATE GRAM NEGATIVE RODS FEW GRAM POSITIVE COCCI    Culture   Final    LIGHT GROWTH STAPHYLOCOCCUS AUREUS RARE GROWTH PROTEUS PENNERI RARE GROWTH ENTEROCOCCUS FAECALIS    Report Status  06/17/2015 FINAL  Final   Organism ID, Bacteria STAPHYLOCOCCUS AUREUS  Final   Organism ID, Bacteria PROTEUS PENNERI  Final   Organism ID, Bacteria ENTEROCOCCUS FAECALIS  Final      Susceptibility   Staphylococcus aureus - MIC*    CIPROFLOXACIN <=0.5 SENSITIVE Sensitive     GENTAMICIN <=0.5 SENSITIVE Sensitive     OXACILLIN 0.5 SENSITIVE Sensitive     TRIMETH/SULFA <=10 SENSITIVE Sensitive     CEFOXITIN SCREEN NEGATIVE Sensitive     Inducible Clindamycin NEGATIVE Sensitive     TETRACYCLINE Value in next row Sensitive      SENSITIVE<=1    * LIGHT GROWTH STAPHYLOCOCCUS AUREUS   Proteus penneri - MIC*    AMPICILLIN Value in next row Resistant      SENSITIVE<=1    CEFAZOLIN Value in next row Resistant      SENSITIVE<=1    CEFTRIAXONE Value in next row Sensitive      SENSITIVE<=1    CIPROFLOXACIN Value in next row Sensitive      SENSITIVE<=1    GENTAMICIN Value in next row Sensitive      SENSITIVE<=1    IMIPENEM Value in next row Sensitive      SENSITIVE<=1    NITROFURANTOIN Value in next row Resistant      SENSITIVE<=1    TRIMETH/SULFA Value in next row Sensitive      SENSITIVE<=1    PIP/TAZO Value in next row Sensitive      SENSITIVE<=4    * RARE GROWTH PROTEUS PENNERI   Enterococcus faecalis - MIC*    AMPICILLIN Value in next row Sensitive      SENSITIVE<=4    LINEZOLID Value in next row Sensitive      SENSITIVE<=4    * RARE GROWTH ENTEROCOCCUS FAECALIS  Wound culture     Status: None   Collection Time: 06/13/15  1:58 PM  Result Value Ref Range Status   Specimen Description LEG  Final   Special Requests Normal  Final   Gram Stain RARE WBC SEEN FEW GRAM NEGATIVE RODS   Final   Culture NO GROWTH 4 DAYS  Final   Report Status 06/17/2015 FINAL  Final    Coagulation Studies: No results for input(s): LABPROT, INR in the last 72 hours.  Urinalysis: No results for input(s): COLORURINE, LABSPEC, PHURINE, GLUCOSEU, HGBUR, BILIRUBINUR, KETONESUR, PROTEINUR, UROBILINOGEN,  NITRITE, LEUKOCYTESUR in the last 72 hours.  Invalid input(s): APPERANCEUR    Imaging: No results found.   Medications:     . feeding supplement  1 Container Oral TID WC  . lactulose  20 g Oral BID  . metoprolol tartrate  50 mg Oral Q8H   sodium chloride, sodium chloride, alteplase, ammonium lactate, anticoagulant sodium citrate, ipratropium-albuterol, lidocaine (PF), lidocaine-prilocaine, LORazepam, ondansetron (ZOFRAN) IV, pentafluoroprop-tetrafluoroeth, senna-docusate  Assessment/ Plan:  64 y.o. male with a PMHx of congestive heart failure ejection fraction 30-35%, prior pneumonia, lower extremity edema with cellulitis, atrial fibrillation who was admitted to [facility] on 06/10/2015 for evaluation of altered mental status and shortness of breath. Upon admission the patient underwent intubation with transition to a mechanical ventilation.  1. Acute renal failure/chronic kidney disease stage III with baseline creatinine 1.3/proteinuria. The patient most likely has acute renal failure secondary to altered cardiorenal hemodynamics. His ejection fraction is low at 30%. Versus post strep GN.  - hemodialysis yesterday 9/2, UF of 2 litres.  - 24 hour urine collection for tomorrow for creatinine clearance.   2. Acute respiratory failure. From underlying heart failure/pulmonary edema.  Had period of intubation. -much improved as compared to his ICU stay.  Continue to monitor respiratory status closely.  Ultrafiltration should continue to help his cardiopulmonary status.Marland Kitchen  3.generalized edema from  Acute systolic heart failure. EF 30-35%.  4. Hypertension: on metoprolol   LOS: [place], [place] 9/3/201611:39 AM

## 2015-06-30 NOTE — Progress Notes (Signed)
Patient ID: Aaron Harrison, male   DOB: [birth year], 64 y.o.   MRN: [id number] [facility] PROGRESS NOTE  HPI/Subjective: Patient feels okay. His breathing is good.  Objective: Filed Vitals:   06/30/15 0852  BP: 177/91  Pulse: 84  Temp: 98.4 F (36.9 C)  Resp: 20    Filed Weights   06/29/15 0915 06/29/15 1211 06/30/15 0500  Weight: 111.4 kg (245 lb 9.5 oz) 108.8 kg (239 lb 13.8 oz) 109.7 kg (241 lb 13.5 oz)    ROS: Review of Systems  Constitutional: Negative for fever and chills.  Eyes: Negative for blurred vision.  Respiratory: Negative for cough and shortness of breath.   Cardiovascular: Negative for chest pain.  Gastrointestinal: Negative for nausea, vomiting, abdominal pain, diarrhea and constipation.  Genitourinary: Negative for dysuria.  Musculoskeletal: Negative for joint pain.  Neurological: Negative for dizziness and headaches.   Exam: Physical Exam  Constitutional: He is oriented to person, place, and time.  HENT:  Nose: No mucosal edema.  Mouth/Throat: No oropharyngeal exudate or posterior oropharyngeal edema.  Eyes: Conjunctivae, EOM and lids are normal. Pupils are equal, round, and reactive to light.  Neck: No JVD present. Carotid bruit is not present. No edema present. No thyroid mass and no thyromegaly present.  Cardiovascular: S1 normal and S2 normal.  Exam reveals no gallop.   No murmur heard. Pulses:      Dorsalis pedis pulses are 2+ on the right side, and 2+ on the left side.  Respiratory: No respiratory distress. He has no wheezes. He has no rhonchi. He has no rales.  GI: Soft. Bowel sounds are normal. There is no tenderness.  Musculoskeletal:       Right ankle: He exhibits swelling.       Left ankle: He exhibits swelling.  Lymphadenopathy:    He has no cervical adenopathy.  Neurological: He is alert and oriented to person, place, and time. No cranial nerve deficit.  Skin: Skin is warm. Nails show no clubbing.  Bilateral lower  extremities covered with Unna boots  Psychiatric: He has a normal mood and affect.    Data Reviewed: Basic Metabolic Panel:  Recent Labs Lab 06/26/15 0530 06/27/15 0554 06/27/15 0555 06/28/15 0459 06/29/15 0344 06/30/15 0500  NA 137  137 136 137 138  138 135 138  K 3.6  3.5 3.5 3.5 3.2*  3.3* 3.2* 3.0*  CL 100*  101 101 102 101  101 99* 102  CO2 26  26 27 28 27  29 27 29   GLUCOSE 92  92 101* 102* 127*  132* 92 93  BUN 62*  64* 66* 69* 49*  47* 50* 36*  CREATININE 4.69*  4.72* 5.22* 5.12* 4.04*  4.01* 4.46* 3.50*  CALCIUM 8.1*  8.0* 7.8* 7.9* 7.9*  7.9* 7.8* 7.8*  MG 1.8  --  1.8 1.8 1.7 1.8  PHOS 5.9* 6.6* 6.6* 4.4  4.4 4.9* 3.7   Liver Function Tests:  Recent Labs Lab 06/27/15 0554 06/27/15 0555 06/28/15 0459 06/29/15 0344 06/30/15 0500  AST  --  28  --   --   --   ALT  --  25  --   --   --   ALKPHOS  --  56  --   --   --   BILITOT  --  2.0*  --   --   --   PROT  --  5.7*  --   --   --   ALBUMIN 2.8* 2.8* 2.9*  3.0* 3.0*   No results for input(s): LIPASE, AMYLASE in the last 168 hours.  Recent Labs Lab 06/27/15 0600  AMMONIA 24   CBC:  Recent Labs Lab 06/25/15 0533 06/26/15 0530 06/29/15 0344  WBC 9.2 8.2  --   HGB 10.0* 10.7*  --   HCT 30.9* 32.8*  --   MCV 103.6* 102.7*  --   PLT 91* 95* 87*     Scheduled Meds: . feeding supplement  1 Container Oral TID WC  . lactulose  20 g Oral BID  . metoprolol tartrate  50 mg Oral Q8H    Assessment/Plan:  1. Acute renal failure unspecified- nephrology placed a permacath but are unable to call this end-stage renal disease at this point. Patient is status post dialysis yesterday. Nephrology to order a 24 hour urine for creatinine clearance. This 24 hour urine will help determine whether he is end-stage renal or not. 2. Acute respiratory failure with hypoxia- resolved off oxygen  3. Acute on chronic systolic congestive heart failure- currently lungs are clear. Dialysis to remove fluid.  Patient is on metoprolol. No ACE inhibitor at this point while trying to figure out whether his kidney function is going to get better or not. 4. Essential hypertension continue metoprolol 5. Clinical sepsis, pneumonia, bilateral lower extending cellulitis- patient finished antibiotics course here in the hospital. MSSA and enterococcus pneumonia. 6. Rapid atrial fibrillation on presentation - metoprolol for rate control, aspirin 7. Hyperkalemia on presentation- improved with dialysis, now with hypokalemia.   Code Status:     Code Status Orders        Start     Ordered   06/21/15 1714  Do not attempt resuscitation (DNR)   Continuous    Question Answer Comment  In the event of cardiac or respiratory ARREST Do not call a "code blue"   In the event of cardiac or respiratory ARREST Do not perform Intubation, CPR, defibrillation or ACLS   In the event of cardiac or respiratory ARREST Use medication by any route, position, wound care, and other measures to relive pain and suffering. May use oxygen, suction and manual treatment of airway obstruction as needed for comfort.   Comments Pt does not want a Permanent Feeding Tube or to be on a ventilator ever again.  He is OK with dialysis, however.      06/21/15 1714     Disposition Plan: No plans at this point.  Time spent: 20 minutes  Loletha Grayer  [facility]

## 2015-07-01 ENCOUNTER — Inpatient Hospital Stay: Payer: Medicaid Other

## 2015-07-01 LAB — MRSA PCR SCREENING: MRSA by PCR: NEGATIVE

## 2015-07-01 LAB — RENAL FUNCTION PANEL
Albumin: 3.3 g/dL — ABNORMAL LOW (ref 3.5–5.0)
Anion gap: 10 (ref 5–15)
BUN: 38 mg/dL — ABNORMAL HIGH (ref 6–20)
CALCIUM: 8.2 mg/dL — AB (ref 8.9–10.3)
CHLORIDE: 99 mmol/L — AB (ref 101–111)
CO2: 29 mmol/L (ref 22–32)
CREATININE: 3.79 mg/dL — AB (ref 0.61–1.24)
GFR calc non Af Amer: 16 mL/min — ABNORMAL LOW (ref >60)
GFR, EST AFRICAN AMERICAN: 18 mL/min — AB (ref >60)
Glucose, Bld: 112 mg/dL — ABNORMAL HIGH (ref 65–99)
Phosphorus: 4.3 mg/dL (ref 2.5–4.6)
Potassium: 3.1 mmol/L — ABNORMAL LOW (ref 3.5–5.1)
SODIUM: 138 mmol/L (ref 135–145)

## 2015-07-01 LAB — CBC WITH DIFFERENTIAL/PLATELET
Basophils Absolute: 0.1 10*3/uL (ref 0–0.1)
Basophils Relative: 1 %
Eosinophils Absolute: 0.5 10*3/uL (ref 0–0.7)
Eosinophils Relative: 8 %
HEMATOCRIT: 25.7 % — AB (ref 40.0–52.0)
HEMOGLOBIN: 8.6 g/dL — AB (ref 13.0–18.0)
LYMPHS ABS: 0.5 10*3/uL — AB (ref 1.0–3.6)
MCH: 33.9 pg (ref 26.0–34.0)
MCHC: 33.4 g/dL (ref 32.0–36.0)
MCV: 101.7 fL — AB (ref 80.0–100.0)
MONO ABS: 0.2 10*3/uL (ref 0.2–1.0)
NEUTROS ABS: 4.4 10*3/uL (ref 1.4–6.5)
Platelets: 99 10*3/uL — ABNORMAL LOW (ref 150–440)
RBC: 2.52 MIL/uL — ABNORMAL LOW (ref 4.40–5.90)
RDW: 15.5 % — AB (ref 11.5–14.5)
WBC: 5.6 10*3/uL (ref 3.8–10.6)

## 2015-07-01 LAB — COMPREHENSIVE METABOLIC PANEL
ALT: 25 U/L (ref 17–63)
AST: 22 U/L (ref 15–41)
Albumin: 3 g/dL — ABNORMAL LOW (ref 3.5–5.0)
Alkaline Phosphatase: 75 U/L (ref 38–126)
Anion gap: 10 (ref 5–15)
BUN: 42 mg/dL — ABNORMAL HIGH (ref 6–20)
CO2: 31 mmol/L (ref 22–32)
Calcium: 8.3 mg/dL — ABNORMAL LOW (ref 8.9–10.3)
Chloride: 99 mmol/L — ABNORMAL LOW (ref 101–111)
Creatinine, Ser: 3.74 mg/dL — ABNORMAL HIGH (ref 0.61–1.24)
GFR calc Af Amer: 19 mL/min — ABNORMAL LOW (ref >60)
GFR calc non Af Amer: 16 mL/min — ABNORMAL LOW (ref >60)
Glucose, Bld: 101 mg/dL — ABNORMAL HIGH (ref 65–99)
Potassium: 3.3 mmol/L — ABNORMAL LOW (ref 3.5–5.1)
Sodium: 140 mmol/L (ref 135–145)
Total Bilirubin: 2.5 mg/dL — ABNORMAL HIGH (ref 0.3–1.2)
Total Protein: 6.4 g/dL — ABNORMAL LOW (ref 6.5–8.1)

## 2015-07-01 LAB — MAGNESIUM: Magnesium: 1.9 mg/dL (ref 1.7–2.4)

## 2015-07-01 LAB — GLUCOSE, CAPILLARY: Glucose-Capillary: 126 mg/dL — ABNORMAL HIGH (ref 65–99)

## 2015-07-01 MED ORDER — FUROSEMIDE 40 MG PO TABS
40.0000 mg | ORAL_TABLET | Freq: Two times a day (BID) | ORAL | Status: DC
  Administered 2015-07-01 – 2015-07-04 (×6): 40 mg via ORAL
  Filled 2015-07-01 (×6): qty 1

## 2015-07-01 MED ORDER — HYDRALAZINE HCL 25 MG PO TABS
25.0000 mg | ORAL_TABLET | Freq: Three times a day (TID) | ORAL | Status: DC
  Administered 2015-07-01 – 2015-07-07 (×19): 25 mg via ORAL
  Filled 2015-07-01 (×20): qty 1

## 2015-07-01 MED ORDER — FUROSEMIDE 10 MG/ML IJ SOLN
100.0000 mg | Freq: Once | INTRAMUSCULAR | Status: DC
  Administered 2015-07-01: 100 mg via INTRAVENOUS
  Filled 2015-07-01: qty 10

## 2015-07-01 MED ORDER — ISOSORBIDE MONONITRATE ER 30 MG PO TB24
30.0000 mg | ORAL_TABLET | Freq: Every day | ORAL | Status: DC
  Administered 2015-07-01 – 2015-07-06 (×6): 30 mg via ORAL
  Filled 2015-07-01 (×6): qty 1

## 2015-07-01 MED ORDER — FUROSEMIDE 10 MG/ML IJ SOLN
100.0000 mg | Freq: Once | INTRAVENOUS | Status: AC
  Administered 2015-07-01: 100 mg via INTRAVENOUS
  Filled 2015-07-01: qty 10

## 2015-07-01 NOTE — Progress Notes (Signed)
Called and spoke with Dr.Reddy about patient's blood pressure of 180/100. No new orders at this time except to wait and monitor a little longer per Dr.Reddy. Aaron Harrison

## 2015-07-01 NOTE — Progress Notes (Signed)
Patient noted to be lethargic and foaming at the mouth. B/P 135/74, HR 82, Afib on telemetry, Sats 98% on 2L., RR 28. Notified Dr. Abigail Butts. Orders received to transfer patient to ICU. Orders noted and patient tranfered to the unit.

## 2015-07-01 NOTE — Progress Notes (Signed)
Subjective:   Hemodialysis Friday. However with increasing shortness of breath this morning.  Attempting 24 hour collection however have missed voids. Condom catheter placed but still not accurate.   Objective:  Vital signs in last 24 hours:  Temp:  [98.3 F (36.8 C)-98.9 F (37.2 C)] 98.9 F (37.2 C) (09/04 0746) Pulse Rate:  [94-106] 98 (09/04 0746) Resp:  [18-20] 20 (09/04 0746) BP: (166-180)/(88-109) 166/97 mmHg (09/04 0746) SpO2:  [84 %-97 %] 96 % (09/04 0746) Weight:  [108.591 kg (239 lb 6.4 oz)] 108.591 kg (239 lb 6.4 oz) (09/04 0539)  Weight change: -2.809 kg (-6 lb 3.1 oz) Filed Weights   06/29/15 1211 06/30/15 0500 07/01/15 0539  Weight: 108.8 kg (239 lb 13.8 oz) 109.7 kg (241 lb 13.5 oz) 108.591 kg (239 lb 6.4 oz)    Intake/Output: I/O last 3 completed shifts: In: 480 [P.O.:480] Out: 325 [Urine:325]   Intake/Output this shift:     Physical Exam: General: ill appearing  Head: Normocephalic, atraumatic.  ENT Moist mucus membranes hearing intact  Neck: Supple, trachea midline  Lungs:  Bilateral crackles  Heart: Irregular S1S2 no rubs  Abdomen:  nontender, mild distension, BS [resemt  Extremities:  + generalized edema  Neurologic:  resting comfortably   Skin: ulcertaion bilateral lower extremities on shins - in UNNA boots  Access: RIJ permcath 8/31 Dr. Lucky Cowboy    Basic Metabolic Panel:  Recent Labs Lab 06/27/15 0555 06/28/15 0459 06/29/15 0344 06/30/15 0500 07/01/15 0539  NA 137 138  138 135 138 138  K 3.5 3.2*  3.3* 3.2* 3.0* 3.1*  CL 102 101  101 99* 102 99*  CO2 28 27  29 27 29 29   GLUCOSE 102* 127*  132* 92 93 112*  BUN 69* 49*  47* 50* 36* 38*  CREATININE 5.12* 4.04*  4.01* 4.46* 3.50* 3.79*  CALCIUM 7.9* 7.9*  7.9* 7.8* 7.8* 8.2*  MG 1.8 1.8 1.7 1.8 1.9  PHOS 6.6* 4.4  4.4 4.9* 3.7 4.3    Liver Function Tests:  Recent Labs Lab 06/27/15 0555 06/28/15 0459 06/29/15 0344 06/30/15 0500 07/01/15 0539  AST 28  --   --   --   --    ALT 25  --   --   --   --   ALKPHOS 56  --   --   --   --   BILITOT 2.0*  --   --   --   --   PROT 5.7*  --   --   --   --   ALBUMIN 2.8* 2.9* 3.0* 3.0* 3.3*   No results for input(s): LIPASE, AMYLASE in the last 168 hours.  Recent Labs Lab 06/27/15 0600  AMMONIA 24    CBC:  Recent Labs Lab 06/25/15 0533 06/26/15 0530 06/29/15 0344  WBC 9.2 8.2  --   HGB 10.0* 10.7*  --   HCT 30.9* 32.8*  --   MCV 103.6* 102.7*  --   PLT 91* 95* 87*    Cardiac Enzymes: No results for input(s): CKTOTAL, CKMB, CKMBINDEX, TROPONINI in the last 168 hours.  BNP: Invalid input(s): POCBNP  CBG:  Recent Labs Lab 06/24/15 1113 06/24/15 1622 06/25/15 1621  GLUCAP 111* 92 23    Microbiology: Results for orders placed or performed during the hospital encounter of 06/10/15  Blood Culture (routine x 2)     Status: None   Collection Time: 06/10/15  2:35 PM  Result Value Ref Range Status   Specimen Description BLOOD  RIGHT WRIST  Final   Special Requests BOTTLES DRAWN AEROBIC AND ANAEROBIC  1CC  Final   Culture NO GROWTH 5 DAYS  Final   Report Status 06/15/2015 FINAL  Final  Blood Culture (routine x 2)     Status: None   Collection Time: 06/10/15  2:40 PM  Result Value Ref Range Status   Specimen Description BLOOD LEFT ASSIST CONTROL  Final   Special Requests   Final    BOTTLES DRAWN AEROBIC AND ANAEROBIC  AER 6CC ANA 4CC   Culture NO GROWTH 5 DAYS  Final   Report Status 06/15/2015 FINAL  Final  Urine culture     Status: None   Collection Time: 06/10/15  4:42 PM  Result Value Ref Range Status   Specimen Description URINE, RANDOM  Final   Special Requests NONE  Final   Culture 4,000 COLONIES/mL INSIGNIFICANT GROWTH  Final   Report Status 06/12/2015 FINAL  Final  MRSA PCR Screening     Status: None   Collection Time: 06/10/15  6:27 PM  Result Value Ref Range Status   MRSA by PCR NEGATIVE NEGATIVE Final    Comment:        The GeneXpert MRSA Assay (FDA approved for NASAL  specimens only), is one component of a comprehensive MRSA colonization surveillance program. It is not intended to diagnose MRSA infection nor to guide or monitor treatment for MRSA infections.   Culture, sputum-assessment     Status: None   Collection Time: 06/11/15  2:50 PM  Result Value Ref Range Status   Specimen Description SPUTUM  Final   Special Requests NONE  Final   Sputum evaluation THIS SPECIMEN IS ACCEPTABLE FOR SPUTUM CULTURE  Final   Report Status 06/13/2015 FINAL  Final  Culture, respiratory (NON-Expectorated)     Status: None   Collection Time: 06/11/15  2:50 PM  Result Value Ref Range Status   Specimen Description SPUTUM  Final   Special Requests NONE Reflexed from [id number]  Final   Gram Stain   Final    MODERATE WBC SEEN RARE GRAM POSITIVE COCCI GOOD SPECIMEN - 80-90% WBCS    Culture MODERATE GROWTH STAPHYLOCOCCUS AUREUS  Final   Report Status 06/14/2015 FINAL  Final   Organism ID, Bacteria STAPHYLOCOCCUS AUREUS  Final      Susceptibility   Staphylococcus aureus - MIC*    CIPROFLOXACIN <=0.5 SENSITIVE Sensitive     GENTAMICIN <=0.5 SENSITIVE Sensitive     OXACILLIN 0.5 SENSITIVE Sensitive     TRIMETH/SULFA <=10 SENSITIVE Sensitive     CEFOXITIN SCREEN NEGATIVE Sensitive     Inducible Clindamycin NEGATIVE Sensitive     TETRACYCLINE Value in next row Sensitive      SENSITIVE<=1    * MODERATE GROWTH STAPHYLOCOCCUS AUREUS  Wound culture     Status: None   Collection Time: 06/13/15  1:56 PM  Result Value Ref Range Status   Specimen Description LEG  Final   Special Requests Normal  Final   Gram Stain   Final    FEW WBC SEEN MODERATE GRAM NEGATIVE RODS FEW GRAM POSITIVE COCCI    Culture   Final    LIGHT GROWTH STAPHYLOCOCCUS AUREUS RARE GROWTH PROTEUS PENNERI RARE GROWTH ENTEROCOCCUS FAECALIS    Report Status 06/17/2015 FINAL  Final   Organism ID, Bacteria STAPHYLOCOCCUS AUREUS  Final   Organism ID, Bacteria PROTEUS PENNERI  Final   Organism ID,  Bacteria ENTEROCOCCUS FAECALIS  Final      Susceptibility  Staphylococcus aureus - MIC*    CIPROFLOXACIN <=0.5 SENSITIVE Sensitive     GENTAMICIN <=0.5 SENSITIVE Sensitive     OXACILLIN 0.5 SENSITIVE Sensitive     TRIMETH/SULFA <=10 SENSITIVE Sensitive     CEFOXITIN SCREEN NEGATIVE Sensitive     Inducible Clindamycin NEGATIVE Sensitive     TETRACYCLINE Value in next row Sensitive      SENSITIVE<=1    * LIGHT GROWTH STAPHYLOCOCCUS AUREUS   Proteus penneri - MIC*    AMPICILLIN Value in next row Resistant      SENSITIVE<=1    CEFAZOLIN Value in next row Resistant      SENSITIVE<=1    CEFTRIAXONE Value in next row Sensitive      SENSITIVE<=1    CIPROFLOXACIN Value in next row Sensitive      SENSITIVE<=1    GENTAMICIN Value in next row Sensitive      SENSITIVE<=1    IMIPENEM Value in next row Sensitive      SENSITIVE<=1    NITROFURANTOIN Value in next row Resistant      SENSITIVE<=1    TRIMETH/SULFA Value in next row Sensitive      SENSITIVE<=1    PIP/TAZO Value in next row Sensitive      SENSITIVE<=4    * RARE GROWTH PROTEUS PENNERI   Enterococcus faecalis - MIC*    AMPICILLIN Value in next row Sensitive      SENSITIVE<=4    LINEZOLID Value in next row Sensitive      SENSITIVE<=4    * RARE GROWTH ENTEROCOCCUS FAECALIS  Wound culture     Status: None   Collection Time: 06/13/15  1:58 PM  Result Value Ref Range Status   Specimen Description LEG  Final   Special Requests Normal  Final   Gram Stain RARE WBC SEEN FEW GRAM NEGATIVE RODS   Final   Culture NO GROWTH 4 DAYS  Final   Report Status 06/17/2015 FINAL  Final    Coagulation Studies: No results for input(s): LABPROT, INR in the last 72 hours.  Urinalysis: No results for input(s): COLORURINE, LABSPEC, PHURINE, GLUCOSEU, HGBUR, BILIRUBINUR, KETONESUR, PROTEINUR, UROBILINOGEN, NITRITE, LEUKOCYTESUR in the last 72 hours.  Invalid input(s): APPERANCEUR    Imaging: Dg Chest Port 1 View  07/01/2015   CLINICAL DATA:   New onset shortness of breath.  EXAM: PORTABLE CHEST - 1 VIEW  COMPARISON:  06/25/2015  FINDINGS: Left-sided central line tip is again noted overlying the level of superior vena cava. New right hemodialysis catheter has been placed, via right internal jugular approach with tip overlying the level of the superior vena cava.  The heart is enlarged. There are diffuse changes of pulmonary edema bilaterally, increased since the prior study. There is no evidence for pneumothorax following line placement.  IMPRESSION: 1. Interval placement of right IJ hemodialysis catheter. 2. No evidence for pneumothorax. 3. Congestive heart failure.   Electronically Signed   By: Nolon Nations M.D.   On: 07/01/2015 10:30     Medications:     . feeding supplement  1 Container Oral TID WC  . furosemide  100 mg Intravenous Once  . hydrALAZINE  25 mg Oral 3 times per day  . isosorbide mononitrate  30 mg Oral Daily  . lactulose  20 g Oral BID  . metoprolol tartrate  50 mg Oral Q8H   alteplase, ammonium lactate, anticoagulant sodium citrate, ipratropium-albuterol, lidocaine (PF), lidocaine-prilocaine, LORazepam, ondansetron (ZOFRAN) IV, pentafluoroprop-tetrafluoroeth, senna-docusate  Assessment/ Plan:  64 y.o. male with  a PMHx of congestive heart failure ejection fraction 30-35%, prior pneumonia, lower extremity edema with cellulitis, atrial fibrillation who was admitted to [facility] on 06/10/2015 for evaluation of altered mental status and shortness of breath. Upon admission the patient underwent intubation with transition to a mechanical ventilation.  1. Acute renal failure/chronic kidney disease stage III with baseline creatinine 1.3/proteinuria. The patient most likely has acute renal failure secondary to altered cardiorenal hemodynamics. His ejection fraction is low at 30%. Versus post strep GN.  - hemodialysis yesterday 9/2, UF of 2 litres. However now with volume overload and pulmonary edema on examination and CXR.  -  Agree with IV furosemide. Low threshold for dialysis later today.  - Discontinue 24 hour urine collection. Place foley catheter for accurate urine output.   2. Acute respiratory failure. From underlying heart failure/pulmonary edema. Had period of intubation. Continue to monitor respiratory status closely.  IV furosemide - start telemetry  3.generalized edema from  Acute systolic heart failure. EF 30-35%.  4. Hypertension: on metoprolol, hydralazine, and isosorbide mononitrate.    LOS: [place], Kaide Gage 9/4/201610:55 AM

## 2015-07-01 NOTE — Progress Notes (Signed)
Patient ID: Aaron Harrison, male   DOB: May 27, 1951, 64 y.o.   MRN: [id number]  [facility] PROGRESS NOTE  HPI/Subjective: Called earlier by the nurse that he was short of breath and using accessory muscles to breathe. Patient states that he started getting short of breath when he woke up this morning. Still short of breath at this point. No complaints of chest pain or any other complaints.  Objective: Filed Vitals:   07/01/15 0746  BP: 166/97  Pulse: 98  Temp: 98.9 F (37.2 C)  Resp: 20    Filed Weights   06/29/15 1211 06/30/15 0500 07/01/15 0539  Weight: 108.8 kg (239 lb 13.8 oz) 109.7 kg (241 lb 13.5 oz) 108.591 kg (239 lb 6.4 oz)    ROS: Review of Systems  Constitutional: Negative for fever and chills.  Eyes: Negative for blurred vision.  Respiratory: Positive for shortness of breath. Negative for cough.   Cardiovascular: Negative for chest pain.  Gastrointestinal: Negative for nausea, vomiting, abdominal pain, diarrhea and constipation.  Genitourinary: Negative for dysuria.  Musculoskeletal: Negative for joint pain.  Neurological: Negative for dizziness and headaches.   Exam: Physical Exam  Constitutional: He is oriented to person, place, and time.  HENT:  Nose: No mucosal edema.  Mouth/Throat: No oropharyngeal exudate or posterior oropharyngeal edema.  Eyes: Conjunctivae, EOM and lids are normal. Pupils are equal, round, and reactive to light.  Neck: No JVD present. Carotid bruit is not present. No edema present. No thyroid mass and no thyromegaly present.  Cardiovascular: S1 normal and S2 normal.  Exam reveals no gallop.   No murmur heard. Pulses:      Dorsalis pedis pulses are 2+ on the right side, and 2+ on the left side.  Respiratory: Accessory muscle usage present. He is in respiratory distress. He has decreased breath sounds in the right lower field and the left lower field. He has no wheezes. He has no rhonchi. He has rales in the right lower field  and the left lower field.  GI: Soft. Bowel sounds are normal. There is no tenderness.  Musculoskeletal:       Right ankle: He exhibits swelling.       Left ankle: He exhibits swelling.  Lymphadenopathy:    He has no cervical adenopathy.  Neurological: He is alert and oriented to person, place, and time. No cranial nerve deficit.  Skin: Skin is warm. Nails show no clubbing.  Bilateral lower extremities covered with Unna boots  Psychiatric: He has a normal mood and affect.    Data Reviewed: Basic Metabolic Panel:  Recent Labs Lab 06/27/15 0555 06/28/15 0459 06/29/15 0344 06/30/15 0500 07/01/15 0539  NA 137 138  138 135 138 138  K 3.5 3.2*  3.3* 3.2* 3.0* 3.1*  CL 102 101  101 99* 102 99*  CO2 28 27  29 27 29 29   GLUCOSE 102* 127*  132* 92 93 112*  BUN 69* 49*  47* 50* 36* 38*  CREATININE 5.12* 4.04*  4.01* 4.46* 3.50* 3.79*  CALCIUM 7.9* 7.9*  7.9* 7.8* 7.8* 8.2*  MG 1.8 1.8 1.7 1.8 1.9  PHOS 6.6* 4.4  4.4 4.9* 3.7 4.3   Liver Function Tests:  Recent Labs Lab 06/27/15 0555 06/28/15 0459 06/29/15 0344 06/30/15 0500 07/01/15 0539  AST 28  --   --   --   --   ALT 25  --   --   --   --   ALKPHOS 56  --   --   --   --  BILITOT 2.0*  --   --   --   --   PROT 5.7*  --   --   --   --   ALBUMIN 2.8* 2.9* 3.0* 3.0* 3.3*   No results for input(s): LIPASE, AMYLASE in the last 168 hours.  Recent Labs Lab 06/27/15 0600  AMMONIA 24   CBC:  Recent Labs Lab 06/25/15 0533 06/26/15 0530 06/29/15 0344  WBC 9.2 8.2  --   HGB 10.0* 10.7*  --   HCT 30.9* 32.8*  --   MCV 103.6* 102.7*  --   PLT 91* 95* 87*     Scheduled Meds: . feeding supplement  1 Container Oral TID WC  . furosemide  100 mg Intravenous Once  . hydrALAZINE  25 mg Oral 3 times per day  . lactulose  20 g Oral BID  . metoprolol tartrate  50 mg Oral Q8H    Assessment/Plan:  1. Acute respiratory failure with hypoxia- patient using accessory muscles to breathe. Patient placed back on  oxygen. 2. Acute on chronic systolic congestive heart failure- rales at the bases. Stat chest x-ray consistent with heart failure. I gave Lasix 100 mg IV stat.  I spoke with nephrology to consider dialysis if Lasix does not work. Patient is on metoprolol. No ACE inhibitor at this point while trying to figure out whether his kidney function is going to get better or not. Add imdur. 3. Acute renal failure- could be ATN or poststreptococcal. Nephrology trying to collect 24-hour urine to see if patient is endstage renal disease or not. 4. Essential hypertension continue metoprolol 5. Clinical sepsis, pneumonia, bilateral lower extending cellulitis- patient finished antibiotics course here in the hospital. MSSA and enterococcus pneumonia. 6. Rapid atrial fibrillation on presentation - metoprolol for rate control, aspirin 7. Hyperkalemia on presentation- improved with dialysis, now with hypokalemia.   Code Status:     Code Status Orders        Start     Ordered   06/21/15 1714  Do not attempt resuscitation (DNR)   Continuous    Question Answer Comment  In the event of cardiac or respiratory ARREST Do not call a "code blue"   In the event of cardiac or respiratory ARREST Do not perform Intubation, CPR, defibrillation or ACLS   In the event of cardiac or respiratory ARREST Use medication by any route, position, wound care, and other measures to relive pain and suffering. May use oxygen, suction and manual treatment of airway obstruction as needed for comfort.   Comments Pt does not want a Permanent Feeding Tube or to be on a ventilator ever again.  He is OK with dialysis, however.      06/21/15 1714     Disposition Plan: No plans at this point.  Time spent: 20 minutes  Loletha Grayer  [facility]

## 2015-07-01 NOTE — Progress Notes (Signed)
eLink Physician-Brief Progress Note Patient Name: SAI ZINN DOB: [birth year] MRN: [id number]   Date of Service  07/01/2015  HPI/Events of Note  64 yo male with PMH pneumonia, LE cellulitis/edema, AFIB, Cardiorenal Syndrome and severe cardiomyopathy with LVEF estimated to be 30% - 35%. Moved to IVU d/t increased respiratory distress. Rxed with Lasix to which he has responded to. Nephrology has re-evaluated patient and decided to dialyze the patient in the AM. Temp = 98.2, BP = 113/68 and HR - 77. Sat = 96% on 2.0 L/min [place] O2. Note the patient a central venous catheter which was placed on August 15th.   eICU Interventions  Will order: 1. D/C central venous line. 2. Culture central venous line tip. Otherwise, continue management per on site physicians.      Intervention Category Evaluation Type: New Patient Evaluation  Lysle Dingwall 07/01/2015, 8:46 PM

## 2015-07-01 NOTE — Progress Notes (Signed)
Orders received for 24 hour urine collection. Condom catheter placed this am. Condom changed out do to difficult remaining in place because his penis is retracting and some  Urine lost. Notified Dr. Abigail Butts who is here on the unit to evaluate patient. Will continue to monitor.

## 2015-07-01 NOTE — Progress Notes (Signed)
Patient appears to be short of breath. Sats noted at 96%on 2lpm. Accessory muscles in use. Lungs sound diminished throughout but greater on the left. Abdominal distention noted as well. Edema noted to BLE up to the knees. Notified Dr. Earleen Newport. New orders received for [facility] and IV lasix. HOB elevated. Will continue to monitor.

## 2015-07-02 LAB — RENAL FUNCTION PANEL
ALBUMIN: 2.9 g/dL — AB (ref 3.5–5.0)
Anion gap: 11 (ref 5–15)
BUN: 41 mg/dL — AB (ref 6–20)
CALCIUM: 8.1 mg/dL — AB (ref 8.9–10.3)
CO2: 30 mmol/L (ref 22–32)
CREATININE: 3.97 mg/dL — AB (ref 0.61–1.24)
Chloride: 98 mmol/L — ABNORMAL LOW (ref 101–111)
GFR calc Af Amer: 17 mL/min — ABNORMAL LOW (ref >60)
GFR, EST NON AFRICAN AMERICAN: 15 mL/min — AB (ref >60)
Glucose, Bld: 88 mg/dL (ref 65–99)
PHOSPHORUS: 5.4 mg/dL — AB (ref 2.5–4.6)
Potassium: 3.1 mmol/L — ABNORMAL LOW (ref 3.5–5.1)
Sodium: 139 mmol/L (ref 135–145)

## 2015-07-02 LAB — MAGNESIUM: MAGNESIUM: 1.7 mg/dL (ref 1.7–2.4)

## 2015-07-02 MED ORDER — POTASSIUM CHLORIDE 10 MEQ/100ML IV SOLN
10.0000 meq | INTRAVENOUS | Status: AC
  Administered 2015-07-02 (×2): 10 meq via INTRAVENOUS
  Filled 2015-07-02 (×2): qty 100

## 2015-07-02 NOTE — Progress Notes (Signed)
[facility] Progress Note Patient Name: Aaron Harrison DOB: Oct 17, 1951 MRN: [id number]   Date of Service  07/02/2015  HPI/Events of Note  SCD ordered for DVT prophylaxis. Heparin deferred as note he has a pending order for HITT screen, thrombocytopenia  eICU Interventions       Intervention Category Intermediate Interventions: Best-practice therapies (e.g. DVT, beta blocker, etc.)  Latif Nazareno S. 07/02/2015, 12:23 AM

## 2015-07-02 NOTE — Progress Notes (Signed)
Report given to Ledell Noss, RN on [place]. Patient is A&Ox4. Denies pain. VSS. Afib per cardiac monitor. Afebrile. No respiratory distress noted, on 2L nasal canula.  Excellent UOP. Tolerating diet.  Patient will be moved to room 227.

## 2015-07-02 NOTE — Progress Notes (Addendum)
Patient ID: Aaron Harrison, male   DOB: [birth year], 64 y.o.   MRN: [id number]  [facility] PROGRESS NOTE  HPI/Subjective: This patient is resting comfortably. Denies any shortness of breath today. More awake and alert today  His last dialysis yesterday  Objective: Filed Vitals:   07/02/15 1300  BP: 108/61  Pulse: 73  Temp:   Resp: 32    Filed Weights   07/01/15 0539 07/01/15 1951 07/02/15 0500  Weight: 108.591 kg (239 lb 6.4 oz) 105.8 kg (233 lb 4 oz) 104.2 kg (229 lb 11.5 oz)    ROS: Review of Systems  Constitutional: Negative for fever and chills.  Eyes: Negative for blurred vision.  Respiratory: Negative for cough and shortness of breath.   Cardiovascular: Negative for chest pain.  Gastrointestinal: Negative for nausea, vomiting, abdominal pain, diarrhea and constipation.  Genitourinary: Negative for dysuria.  Musculoskeletal: Negative for joint pain.  Neurological: Negative for dizziness and headaches.   Exam: Physical Exam  Constitutional: He is oriented to person, place, and time.  HENT:  Nose: No mucosal edema.  Mouth/Throat: No oropharyngeal exudate or posterior oropharyngeal edema.  Eyes: Conjunctivae, EOM and lids are normal. Pupils are equal, round, and reactive to light.  Neck: No JVD present. Carotid bruit is not present. No edema present. No thyroid mass and no thyromegaly present.  Cardiovascular: S1 normal and S2 normal.  Exam reveals no gallop.   No murmur heard. Pulses:      Dorsalis pedis pulses are 2+ on the right side, and 2+ on the left side.  Respiratory: Effort normal. No respiratory distress. He has decreased breath sounds in the right lower field and the left lower field. He has no wheezes. He has no rhonchi. He has no rales.  GI: Soft. Bowel sounds are normal. There is no tenderness.  Musculoskeletal:       Right ankle: He exhibits swelling.       Left ankle: He exhibits swelling.  Lymphadenopathy:    He has no cervical  adenopathy.  Neurological: He is alert and oriented to person, place, and time. No cranial nerve deficit.  Skin: Skin is warm. Nails show no clubbing.  Bilateral lower extremities covered with Unna boots  Psychiatric: He has a normal mood and affect.    Data Reviewed: Basic Metabolic Panel:  Recent Labs Lab 06/28/15 0459 06/29/15 0344 06/30/15 0500 07/01/15 0539 07/01/15 2114 07/02/15 0801  NA 138  138 135 138 138 140 139  K 3.2*  3.3* 3.2* 3.0* 3.1* 3.3* 3.1*  CL 101  101 99* 102 99* 99* 98*  CO2 27  29 27 29 29 31 30   GLUCOSE 127*  132* 92 93 112* 101* 88  BUN 49*  47* 50* 36* 38* 42* 41*  CREATININE 4.04*  4.01* 4.46* 3.50* 3.79* 3.74* 3.97*  CALCIUM 7.9*  7.9* 7.8* 7.8* 8.2* 8.3* 8.1*  MG 1.8 1.7 1.8 1.9  --  1.7  PHOS 4.4  4.4 4.9* 3.7 4.3  --  5.4*   Liver Function Tests:  Recent Labs Lab 06/27/15 0555  06/29/15 0344 06/30/15 0500 07/01/15 0539 07/01/15 2114 07/02/15 0801  AST 28  --   --   --   --  22  --   ALT 25  --   --   --   --  25  --   ALKPHOS 56  --   --   --   --  75  --   BILITOT 2.0*  --   --   --   --  2.5*  --   PROT 5.7*  --   --   --   --  6.4*  --   ALBUMIN 2.8*  < > 3.0* 3.0* 3.3* 3.0* 2.9*  < > = values in this interval not displayed. No results for input(s): LIPASE, AMYLASE in the last 168 hours.  Recent Labs Lab 06/27/15 0600  AMMONIA 24   CBC:  Recent Labs Lab 06/26/15 0530 06/29/15 0344 07/01/15 2114  WBC 8.2  --  5.6  NEUTROABS  --   --  4.4  HGB 10.7*  --  8.6*  HCT 32.8*  --  25.7*  MCV 102.7*  --  101.7*  PLT 95* 87* 99*     Scheduled Meds: . feeding supplement  1 Container Oral TID WC  . furosemide  40 mg Oral BID  . hydrALAZINE  25 mg Oral 3 times per day  . isosorbide mononitrate  30 mg Oral Daily  . lactulose  20 g Oral BID  . metoprolol tartrate  50 mg Oral Q8H    Assessment/Plan:  1. Acute respiratory failure with hypoxia- secondary to fluid overload. clinically improved. Patient has  received 1 dose of Lasix yesterday. Patient placed back on oxygen, we will slowly wean it off. 2. Acute on chronic systolic congestive heart failure- rales at the bases. Stat chest x-ray consistent with heart failure. Received Lasix 100 mg IV stat yesterday.  I spoke with nephrology to consider dialysis if Lasix does not work. Patient is on metoprolol. No ACE inhibitor at this point while trying to figure out whether his kidney function is going to get better or not. Add imdur. 3. Acute renal failure on chronic kidney disease stage III- could be ATN or poststreptococcal. Nephrology trying to collect 24-hour urine, started today a.m., to see if patient is endstage renal disease or not. 4. Essential hypertension continue metoprolol 5. Clinical sepsis, pneumonia, bilateral lower extending cellulitis- patient finished antibiotics course here in the hospital. MSSA and enterococcus pneumonia. 6. Rapid atrial fibrillation on presentation - rate controlled at this time. Continue metoprolol for rate control, aspirin 7. Hyperkalemia on presentation- improved with dialysis, now with hypokalemia. Will supplement potassium as needed basis. Check a.m. Labs 8. Generalized weakness-consult physical therapy   Code Status:     Code Status Orders        Start     Ordered   06/21/15 1714  Do not attempt resuscitation (DNR)   Continuous    Question Answer Comment  In the event of cardiac or respiratory ARREST Do not call a "code blue"   In the event of cardiac or respiratory ARREST Do not perform Intubation, CPR, defibrillation or ACLS   In the event of cardiac or respiratory ARREST Use medication by any route, position, wound care, and other measures to relive pain and suffering. May use oxygen, suction and manual treatment of airway obstruction as needed for comfort.   Comments Pt does not want a Permanent Feeding Tube or to be on a ventilator ever again.  He is OK with dialysis, however.      06/21/15 1714      Disposition Plan: No plans at this point.  Time spent: 30  minutes  Danyale Ridinger  [facility]

## 2015-07-02 NOTE — Progress Notes (Signed)
RN spoke with Dr. Margaretmary Eddy and made MD aware that Dr. Mortimer Fries and Dr. Juleen China are okay with patient being moved back to the floor. Dr. Margaretmary Eddy stated "okay I am driving, but you can transfer patient to any floor with tele."

## 2015-07-02 NOTE — Progress Notes (Signed)
Subjective:   Last hemodialysis Friday.  UOP 5725. - furosemide 40mg  BID. 100mg  bolus given yesterday.   Objective:  Vital signs in last 24 hours:  Temp:  [98 F (36.7 C)-98.5 F (36.9 C)] 98 F (36.7 C) (09/05 0830) Pulse Rate:  [62-94] 75 (09/05 0900) Resp:  [16-37] 28 (09/05 0900) BP: (107-155)/(50-95) 132/68 mmHg (09/05 0900) SpO2:  [89 %-100 %] 93 % (09/05 0900) Weight:  [104.2 kg (229 lb 11.5 oz)-105.8 kg (233 lb 4 oz)] 104.2 kg (229 lb 11.5 oz) (09/05 0500)  Weight change: -2.791 kg (-6 lb 2.5 oz) Filed Weights   07/01/15 0539 07/01/15 1951 07/02/15 0500  Weight: 108.591 kg (239 lb 6.4 oz) 105.8 kg (233 lb 4 oz) 104.2 kg (229 lb 11.5 oz)    Intake/Output: I/O last 3 completed shifts: In: 120 [P.O.:120] Out: 5725 [Urine:5725]   Intake/Output this shift:  Total I/O In: 300 [Other:300] Out: -   Physical Exam: General: NAD  Head: Normocephalic, atraumatic.  ENT Moist mucus membranes hearing intact  Neck: Supple, trachea midline  Lungs:  clear  Heart: Irregular S1S2 no rubs  Abdomen:  nontender, mild distension, BS [resemt  Extremities:  + generalized edema  Neurologic:  resting comfortably   Skin: ulcertaion bilateral lower extremities on shins - in UNNA boots  Access: RIJ permcath 8/31 Dr. Lucky Cowboy    Basic Metabolic Panel:  Recent Labs Lab 06/28/15 0459 06/29/15 0344 06/30/15 0500 07/01/15 0539 07/01/15 2114 07/02/15 0801  NA 138  138 135 138 138 140 139  K 3.2*  3.3* 3.2* 3.0* 3.1* 3.3* 3.1*  CL 101  101 99* 102 99* 99* 98*  CO2 27  29 27 29 29 31 30   GLUCOSE 127*  132* 92 93 112* 101* 88  BUN 49*  47* 50* 36* 38* 42* 41*  CREATININE 4.04*  4.01* 4.46* 3.50* 3.79* 3.74* 3.97*  CALCIUM 7.9*  7.9* 7.8* 7.8* 8.2* 8.3* 8.1*  MG 1.8 1.7 1.8 1.9  --  1.7  PHOS 4.4  4.4 4.9* 3.7 4.3  --  5.4*    Liver Function Tests:  Recent Labs Lab 06/27/15 0555  06/29/15 0344 06/30/15 0500 07/01/15 0539 07/01/15 2114 07/02/15 0801  AST 28  --   --    --   --  22  --   ALT 25  --   --   --   --  25  --   ALKPHOS 56  --   --   --   --  75  --   BILITOT 2.0*  --   --   --   --  2.5*  --   PROT 5.7*  --   --   --   --  6.4*  --   ALBUMIN 2.8*  < > 3.0* 3.0* 3.3* 3.0* 2.9*  < > = values in this interval not displayed. No results for input(s): LIPASE, AMYLASE in the last 168 hours.  Recent Labs Lab 06/27/15 0600  AMMONIA 24    CBC:  Recent Labs Lab 06/26/15 0530 06/29/15 0344 07/01/15 2114  WBC 8.2  --  5.6  NEUTROABS  --   --  4.4  HGB 10.7*  --  8.6*  HCT 32.8*  --  25.7*  MCV 102.7*  --  101.7*  PLT 95* 87* 99*    Cardiac Enzymes: No results for input(s): CKTOTAL, CKMB, CKMBINDEX, TROPONINI in the last 168 hours.  BNP: Invalid input(s): POCBNP  CBG:  Recent Labs Lab 06/25/15  1621 07/01/15 2002  GLUCAP 89 126*    Microbiology: Results for orders placed or performed during the hospital encounter of 06/10/15  Blood Culture (routine x 2)     Status: None   Collection Time: 06/10/15  2:35 PM  Result Value Ref Range Status   Specimen Description BLOOD RIGHT WRIST  Final   Special Requests BOTTLES DRAWN AEROBIC AND ANAEROBIC  [place]  Final   Culture NO GROWTH 5 DAYS  Final   Report Status 06/15/2015 FINAL  Final  Blood Culture (routine x 2)     Status: None   Collection Time: 06/10/15  2:40 PM  Result Value Ref Range Status   Specimen Description BLOOD LEFT ASSIST CONTROL  Final   Special Requests   Final    BOTTLES DRAWN AEROBIC AND ANAEROBIC  AER 6CC ANA 4CC   Culture NO GROWTH 5 DAYS  Final   Report Status 06/15/2015 FINAL  Final  Urine culture     Status: None   Collection Time: 06/10/15  4:42 PM  Result Value Ref Range Status   Specimen Description URINE, RANDOM  Final   Special Requests NONE  Final   Culture 4,000 COLONIES/mL INSIGNIFICANT GROWTH  Final   Report Status 06/12/2015 FINAL  Final  MRSA PCR Screening     Status: None   Collection Time: 06/10/15  6:27 PM  Result Value Ref Range Status    MRSA by PCR NEGATIVE NEGATIVE Final    Comment:        The GeneXpert MRSA Assay (FDA approved for NASAL specimens only), is one component of a comprehensive MRSA colonization surveillance program. It is not intended to diagnose MRSA infection nor to guide or monitor treatment for MRSA infections.   Culture, sputum-assessment     Status: None   Collection Time: 06/11/15  2:50 PM  Result Value Ref Range Status   Specimen Description SPUTUM  Final   Special Requests NONE  Final   Sputum evaluation THIS SPECIMEN IS ACCEPTABLE FOR SPUTUM CULTURE  Final   Report Status 06/13/2015 FINAL  Final  Culture, respiratory (NON-Expectorated)     Status: None   Collection Time: 06/11/15  2:50 PM  Result Value Ref Range Status   Specimen Description SPUTUM  Final   Special Requests NONE Reflexed from [id number]  Final   Gram Stain   Final    MODERATE WBC SEEN RARE GRAM POSITIVE COCCI GOOD SPECIMEN - 80-90% WBCS    Culture MODERATE GROWTH STAPHYLOCOCCUS AUREUS  Final   Report Status 06/14/2015 FINAL  Final   Organism ID, Bacteria STAPHYLOCOCCUS AUREUS  Final      Susceptibility   Staphylococcus aureus - MIC*    CIPROFLOXACIN <=0.5 SENSITIVE Sensitive     GENTAMICIN <=0.5 SENSITIVE Sensitive     OXACILLIN 0.5 SENSITIVE Sensitive     TRIMETH/SULFA <=10 SENSITIVE Sensitive     CEFOXITIN SCREEN NEGATIVE Sensitive     Inducible Clindamycin NEGATIVE Sensitive     TETRACYCLINE Value in next row Sensitive      SENSITIVE<=1    * MODERATE GROWTH STAPHYLOCOCCUS AUREUS  Wound culture     Status: None   Collection Time: 06/13/15  1:56 PM  Result Value Ref Range Status   Specimen Description LEG  Final   Special Requests Normal  Final   Gram Stain   Final    FEW WBC SEEN MODERATE GRAM NEGATIVE RODS FEW GRAM POSITIVE COCCI    Culture   Final    LIGHT GROWTH STAPHYLOCOCCUS  AUREUS RARE GROWTH PROTEUS PENNERI RARE GROWTH ENTEROCOCCUS FAECALIS    Report Status 06/17/2015 FINAL  Final   Organism  ID, Bacteria STAPHYLOCOCCUS AUREUS  Final   Organism ID, Bacteria PROTEUS PENNERI  Final   Organism ID, Bacteria ENTEROCOCCUS FAECALIS  Final      Susceptibility   Staphylococcus aureus - MIC*    CIPROFLOXACIN <=0.5 SENSITIVE Sensitive     GENTAMICIN <=0.5 SENSITIVE Sensitive     OXACILLIN 0.5 SENSITIVE Sensitive     TRIMETH/SULFA <=10 SENSITIVE Sensitive     CEFOXITIN SCREEN NEGATIVE Sensitive     Inducible Clindamycin NEGATIVE Sensitive     TETRACYCLINE Value in next row Sensitive      SENSITIVE<=1    * LIGHT GROWTH STAPHYLOCOCCUS AUREUS   Proteus penneri - MIC*    AMPICILLIN Value in next row Resistant      SENSITIVE<=1    CEFAZOLIN Value in next row Resistant      SENSITIVE<=1    CEFTRIAXONE Value in next row Sensitive      SENSITIVE<=1    CIPROFLOXACIN Value in next row Sensitive      SENSITIVE<=1    GENTAMICIN Value in next row Sensitive      SENSITIVE<=1    IMIPENEM Value in next row Sensitive      SENSITIVE<=1    NITROFURANTOIN Value in next row Resistant      SENSITIVE<=1    TRIMETH/SULFA Value in next row Sensitive      SENSITIVE<=1    PIP/TAZO Value in next row Sensitive      SENSITIVE<=4    * RARE GROWTH PROTEUS PENNERI   Enterococcus faecalis - MIC*    AMPICILLIN Value in next row Sensitive      SENSITIVE<=4    LINEZOLID Value in next row Sensitive      SENSITIVE<=4    * RARE GROWTH ENTEROCOCCUS FAECALIS  Wound culture     Status: None   Collection Time: 06/13/15  1:58 PM  Result Value Ref Range Status   Specimen Description LEG  Final   Special Requests Normal  Final   Gram Stain RARE WBC SEEN FEW GRAM NEGATIVE RODS   Final   Culture NO GROWTH 4 DAYS  Final   Report Status 06/17/2015 FINAL  Final  MRSA PCR Screening     Status: None   Collection Time: 07/01/15  7:40 PM  Result Value Ref Range Status   MRSA by PCR NEGATIVE NEGATIVE Final    Comment:        The GeneXpert MRSA Assay (FDA approved for NASAL specimens only), is one component of  a comprehensive MRSA colonization surveillance program. It is not intended to diagnose MRSA infection nor to guide or monitor treatment for MRSA infections.     Coagulation Studies: No results for input(s): LABPROT, INR in the last 72 hours.  Urinalysis: No results for input(s): COLORURINE, LABSPEC, PHURINE, GLUCOSEU, HGBUR, BILIRUBINUR, KETONESUR, PROTEINUR, UROBILINOGEN, NITRITE, LEUKOCYTESUR in the last 72 hours.  Invalid input(s): APPERANCEUR    Imaging: Dg Chest Port 1 View  07/01/2015   CLINICAL DATA:  New onset shortness of breath.  EXAM: PORTABLE CHEST - 1 VIEW  COMPARISON:  06/25/2015  FINDINGS: Left-sided central line tip is again noted overlying the level of superior vena cava. New right hemodialysis catheter has been placed, via right internal jugular approach with tip overlying the level of the superior vena cava.  The heart is enlarged. There are diffuse changes of pulmonary edema bilaterally, increased since the  prior study. There is no evidence for pneumothorax following line placement.  IMPRESSION: 1. Interval placement of right IJ hemodialysis catheter. 2. No evidence for pneumothorax. 3. Congestive heart failure.   Electronically Signed   By: Nolon Nations M.D.   On: 07/01/2015 10:30     Medications:     . feeding supplement  1 Container Oral TID WC  . furosemide  40 mg Oral BID  . hydrALAZINE  25 mg Oral 3 times per day  . isosorbide mononitrate  30 mg Oral Daily  . lactulose  20 g Oral BID  . metoprolol tartrate  50 mg Oral Q8H   alteplase, ammonium lactate, anticoagulant sodium citrate, ipratropium-albuterol, lidocaine (PF), lidocaine-prilocaine, LORazepam, ondansetron (ZOFRAN) IV, pentafluoroprop-tetrafluoroeth, senna-docusate  Assessment/ Plan:  64 y.o. male with a PMHx of congestive heart failure ejection fraction 30-35%, prior pneumonia, lower extremity edema with cellulitis, atrial fibrillation who was admitted to [facility] on 06/10/2015 for evaluation of  altered mental status and shortness of breath. Upon admission the patient underwent intubation with transition to a mechanical ventilation.  1. Acute renal failure/chronic kidney disease stage III with baseline creatinine 1.3 with proteinuria. The patient most likely has acute renal failure secondary to altered cardiorenal hemodynamics. His ejection fraction is low at 30%. Versus post strep GN.  - hemodialysis last on 9/2, UF of 2 litres. - Agree with furosemide. Nonoliguric urine output.  - 24 hour urine collection. Placed foley catheter for accurate urine output.   2. Acute respiratory failure. From underlying heart failure/pulmonary edema. Had period of intubation. Continue to monitor respiratory status closely.  Continue furosemide - Continue telemetry  3.generalized edema from  Acute systolic heart failure. EF 30-35%.  4. Hypertension: on metoprolol, hydralazine, and isosorbide mononitrate.    LOS: [place], [place] 9/5/201610:12 AM

## 2015-07-02 NOTE — Clinical Social Work Note (Signed)
CSW, Baxter Flattery, to place late entry from Friday regarding discussion she had with patient and with [name]. Bedsearch resent to [facility], and now to [facility] and [facility] [place].  Shela Leff MSW,LCSW [phone]

## 2015-07-02 NOTE — Progress Notes (Signed)
Patient moved to room 227 by bed with Ronalee Belts, orderly.  Patient on off unit tele monitor verified by Thayer Headings, tele clerk and o2 at 2L when moved to [facility].  Alert with no distress noted when leaving ICU. 24H urine collection in progress and Abbie, RN aware.

## 2015-07-02 NOTE — Consult Note (Signed)
WOC wound follow up Wound type:Chronic venous insufficiency Weekly Unnas boots applications with Calamine paste.  None in house and will wrap with a dry [facility] boot after moisturizing the legs and applying a moisture retentive dressing to the nonintact lesions.  Measurement:Left anterior calf 2 cm x 1.2 cm scabbed Lesion left posterior 2 cm x 2 cm x 0.2 cm Right anterior calf 4 cm x 2 cm x 0.2 cm 100% pink and moist posterior right 2 cm x 1 cm x 0.2 cm Wound YTK:ZSWFU red Drainage (amount, consistency, odor) Minimal drainage noted today.  No odor.  No edema Periwound:Intact.  Legs cleansed and moisturized Dressing procedure/placement/frequency:Cleanse bilateral legs with soap and water and pat gently dry.  Moisturize legs. Apply petrolatum dressing to wound bed.  Cover with kerlix, from base of toes to just below knee.  Secure with Coban.  Change weekly.  Comfort team will follow.  Will not follow at this time.  Please re-consult if needed.  Domenic Moras RN BSN [place] Pager [phone]

## 2015-07-02 NOTE — Care Management Note (Signed)
Case Management Note  Patient Details  Name: Aaron Harrison MRN: [id number] Date of Birth: 16-Feb-1951  Subjective/Objective:  Transfer back to ICU from [facility] due to respiratory distress.                Action/Plan: Will follow progression  Expected Discharge Date:                  Expected Discharge Plan:  [place]  In-House Referral:  Clinical Social Work  Discharge planning Services     Post Acute Care Choice:    Choice offered to:     DME Arranged:    DME Agency:     HH Arranged:    [place] Agency:     Status of Service:  In process, will continue to follow  Medicare Important Message Given:    Date Medicare IM Given:    Medicare IM give by:    Date Additional Medicare IM Given:    Additional Medicare Important Message give by:     If discussed at [place] of Stay Meetings, dates discussed:    Additional Comments:  Jolly Mango, RN 07/02/2015, 9:04 AM

## 2015-07-03 LAB — RENAL FUNCTION PANEL
ANION GAP: 11 (ref 5–15)
Albumin: 3 g/dL — ABNORMAL LOW (ref 3.5–5.0)
BUN: 46 mg/dL — AB (ref 6–20)
CO2: 33 mmol/L — AB (ref 22–32)
Calcium: 7.7 mg/dL — ABNORMAL LOW (ref 8.9–10.3)
Chloride: 90 mmol/L — ABNORMAL LOW (ref 101–111)
Creatinine, Ser: 3.96 mg/dL — ABNORMAL HIGH (ref 0.61–1.24)
GFR calc Af Amer: 17 mL/min — ABNORMAL LOW (ref >60)
GFR calc non Af Amer: 15 mL/min — ABNORMAL LOW (ref >60)
GLUCOSE: 132 mg/dL — AB (ref 65–99)
POTASSIUM: 2.5 mmol/L — AB (ref 3.5–5.1)
Phosphorus: 3.8 mg/dL (ref 2.5–4.6)
Sodium: 134 mmol/L — ABNORMAL LOW (ref 135–145)

## 2015-07-03 LAB — BASIC METABOLIC PANEL
ANION GAP: 17 — AB (ref 5–15)
BUN: 46 mg/dL — ABNORMAL HIGH (ref 6–20)
CHLORIDE: 86 mmol/L — AB (ref 101–111)
CO2: 27 mmol/L (ref 22–32)
Calcium: 7.2 mg/dL — ABNORMAL LOW (ref 8.9–10.3)
Creatinine, Ser: 3.91 mg/dL — ABNORMAL HIGH (ref 0.61–1.24)
GFR calc non Af Amer: 15 mL/min — ABNORMAL LOW (ref >60)
GFR, EST AFRICAN AMERICAN: 18 mL/min — AB (ref >60)
GLUCOSE: 125 mg/dL — AB (ref 65–99)
POTASSIUM: 2.4 mmol/L — AB (ref 3.5–5.1)
Sodium: 130 mmol/L — ABNORMAL LOW (ref 135–145)

## 2015-07-03 LAB — CBC
HEMATOCRIT: 28.9 % — AB (ref 40.0–52.0)
HEMOGLOBIN: 9.8 g/dL — AB (ref 13.0–18.0)
MCH: 34 pg (ref 26.0–34.0)
MCHC: 33.8 g/dL (ref 32.0–36.0)
MCV: 100.6 fL — AB (ref 80.0–100.0)
Platelets: 133 10*3/uL — ABNORMAL LOW (ref 150–440)
RBC: 2.87 MIL/uL — AB (ref 4.40–5.90)
RDW: 15.4 % — ABNORMAL HIGH (ref 11.5–14.5)
WBC: 4.9 10*3/uL (ref 3.8–10.6)

## 2015-07-03 LAB — MAGNESIUM: Magnesium: 1.4 mg/dL — ABNORMAL LOW (ref 1.7–2.4)

## 2015-07-03 MED ORDER — POTASSIUM CHLORIDE 10 MEQ/100ML IV SOLN
10.0000 meq | INTRAVENOUS | Status: AC
  Administered 2015-07-03 (×4): 10 meq via INTRAVENOUS
  Filled 2015-07-03 (×4): qty 100

## 2015-07-03 MED ORDER — POTASSIUM CHLORIDE CRYS ER 20 MEQ PO TBCR
40.0000 meq | EXTENDED_RELEASE_TABLET | Freq: Every day | ORAL | Status: DC
  Administered 2015-07-03 – 2015-07-08 (×6): 40 meq via ORAL
  Filled 2015-07-03 (×6): qty 2

## 2015-07-03 NOTE — Progress Notes (Signed)
Subjective:   Patient transferred to [facility].  UOP 3200 Creatinine 3.96 (3.97)  K 2.5 - IV replaced.   Objective:  Vital signs in last 24 hours:  Temp:  [97.8 F (36.6 C)-98.8 F (37.1 C)] 97.8 F (36.6 C) (09/06 0831) Pulse Rate:  [67-84] 80 (09/06 0831) Resp:  [17-28] 18 (09/06 0831) BP: (95-136)/(54-74) 129/67 mmHg (09/06 0831) SpO2:  [93 %-100 %] 99 % (09/06 0831) Weight:  [103.8 kg (228 lb 13.4 oz)] 103.8 kg (228 lb 13.4 oz) (09/06 0500)  Weight change: -2 kg (-4 lb 6.6 oz) Filed Weights   07/01/15 1951 07/02/15 0500 07/03/15 0500  Weight: 105.8 kg (233 lb 4 oz) 104.2 kg (229 lb 11.5 oz) 103.8 kg (228 lb 13.4 oz)    Intake/Output: I/O last 3 completed shifts: In: 2275 [P.O.:700; Other:1575] Out: 2956 [Urine:6275]   Intake/Output this shift:  Total I/O In: 480 [P.O.:480] Out: 1350 [Urine:1350]  Physical Exam: General: NAD  Head: Normocephalic, atraumatic.  ENT Moist mucus membranes hearing intact  Neck: Supple, trachea midline  Lungs:  clear  Heart: Irregular S1S2 no rubs  Abdomen:  nontender, mild distension, BS [resemt  Extremities:  + generalized edema  Neurologic:  resting comfortably   Skin: ulcertaion bilateral lower extremities on shins - in UNNA boots  Access: RIJ permcath 8/31 Dr. Lucky Cowboy    Basic Metabolic Panel:  Recent Labs Lab 06/28/15 0459 06/29/15 0344 06/30/15 0500 07/01/15 0539 07/01/15 2114 07/02/15 0801 07/03/15 1027  NA 138  138 135 138 138 140 139 134*  K 3.2*  3.3* 3.2* 3.0* 3.1* 3.3* 3.1* 2.5*  CL 101  101 99* 102 99* 99* 98* 90*  CO2 27  29 27 29 29 31 30  33*  GLUCOSE 127*  132* 92 93 112* 101* 88 132*  BUN 49*  47* 50* 36* 38* 42* 41* 46*  CREATININE 4.04*  4.01* 4.46* 3.50* 3.79* 3.74* 3.97* 3.96*  CALCIUM 7.9*  7.9* 7.8* 7.8* 8.2* 8.3* 8.1* 7.7*  MG 1.8 1.7 1.8 1.9  --  1.7  --   PHOS 4.4  4.4 4.9* 3.7 4.3  --  5.4* 3.8    Liver Function Tests:  Recent Labs Lab 06/27/15 0555  06/30/15 0500 07/01/15 0539  07/01/15 2114 07/02/15 0801 07/03/15 1027  AST 28  --   --   --  22  --   --   ALT 25  --   --   --  25  --   --   ALKPHOS 56  --   --   --  75  --   --   BILITOT 2.0*  --   --   --  2.5*  --   --   PROT 5.7*  --   --   --  6.4*  --   --   ALBUMIN 2.8*  < > 3.0* 3.3* 3.0* 2.9* 3.0*  < > = values in this interval not displayed. No results for input(s): LIPASE, AMYLASE in the last 168 hours.  Recent Labs Lab 06/27/15 0600  AMMONIA 24    CBC:  Recent Labs Lab 06/29/15 0344 07/01/15 2114 07/03/15 1027  WBC  --  5.6 4.9  NEUTROABS  --  4.4  --   HGB  --  8.6* 9.8*  HCT  --  25.7* 28.9*  MCV  --  101.7* 100.6*  PLT 87* 99* 133*    Cardiac Enzymes: No results for input(s): CKTOTAL, CKMB, CKMBINDEX, TROPONINI in the last 168 hours.  BNP: Invalid input(s): POCBNP  CBG:  Recent Labs Lab 07/01/15 2002  GLUCAP 126*    Microbiology: Results for orders placed or performed during the hospital encounter of 06/10/15  Blood Culture (routine x 2)     Status: None   Collection Time: 06/10/15  2:35 PM  Result Value Ref Range Status   Specimen Description BLOOD RIGHT WRIST  Final   Special Requests BOTTLES DRAWN AEROBIC AND ANAEROBIC  [place]  Final   Culture NO GROWTH 5 DAYS  Final   Report Status 06/15/2015 FINAL  Final  Blood Culture (routine x 2)     Status: None   Collection Time: 06/10/15  2:40 PM  Result Value Ref Range Status   Specimen Description BLOOD LEFT ASSIST CONTROL  Final   Special Requests   Final    BOTTLES DRAWN AEROBIC AND ANAEROBIC  AER 6CC ANA 4CC   Culture NO GROWTH 5 DAYS  Final   Report Status 06/15/2015 FINAL  Final  Urine culture     Status: None   Collection Time: 06/10/15  4:42 PM  Result Value Ref Range Status   Specimen Description URINE, RANDOM  Final   Special Requests NONE  Final   Culture 4,000 COLONIES/mL INSIGNIFICANT GROWTH  Final   Report Status 06/12/2015 FINAL  Final  MRSA PCR Screening     Status: None   Collection Time:  06/10/15  6:27 PM  Result Value Ref Range Status   MRSA by PCR NEGATIVE NEGATIVE Final    Comment:        The GeneXpert MRSA Assay (FDA approved for NASAL specimens only), is one component of a comprehensive MRSA colonization surveillance program. It is not intended to diagnose MRSA infection nor to guide or monitor treatment for MRSA infections.   Culture, sputum-assessment     Status: None   Collection Time: 06/11/15  2:50 PM  Result Value Ref Range Status   Specimen Description SPUTUM  Final   Special Requests NONE  Final   Sputum evaluation THIS SPECIMEN IS ACCEPTABLE FOR SPUTUM CULTURE  Final   Report Status 06/13/2015 FINAL  Final  Culture, respiratory (NON-Expectorated)     Status: None   Collection Time: 06/11/15  2:50 PM  Result Value Ref Range Status   Specimen Description SPUTUM  Final   Special Requests NONE Reflexed from [id number]  Final   Gram Stain   Final    MODERATE WBC SEEN RARE GRAM POSITIVE COCCI GOOD SPECIMEN - 80-90% WBCS    Culture MODERATE GROWTH STAPHYLOCOCCUS AUREUS  Final   Report Status 06/14/2015 FINAL  Final   Organism ID, Bacteria STAPHYLOCOCCUS AUREUS  Final      Susceptibility   Staphylococcus aureus - MIC*    CIPROFLOXACIN <=0.5 SENSITIVE Sensitive     GENTAMICIN <=0.5 SENSITIVE Sensitive     OXACILLIN 0.5 SENSITIVE Sensitive     TRIMETH/SULFA <=10 SENSITIVE Sensitive     CEFOXITIN SCREEN NEGATIVE Sensitive     Inducible Clindamycin NEGATIVE Sensitive     TETRACYCLINE Value in next row Sensitive      SENSITIVE<=1    * MODERATE GROWTH STAPHYLOCOCCUS AUREUS  Wound culture     Status: None   Collection Time: 06/13/15  1:56 PM  Result Value Ref Range Status   Specimen Description LEG  Final   Special Requests Normal  Final   Gram Stain   Final    FEW WBC SEEN MODERATE GRAM NEGATIVE RODS FEW GRAM POSITIVE COCCI    Culture  Final    LIGHT GROWTH STAPHYLOCOCCUS AUREUS RARE GROWTH PROTEUS PENNERI RARE GROWTH ENTEROCOCCUS FAECALIS     Report Status 06/17/2015 FINAL  Final   Organism ID, Bacteria STAPHYLOCOCCUS AUREUS  Final   Organism ID, Bacteria PROTEUS PENNERI  Final   Organism ID, Bacteria ENTEROCOCCUS FAECALIS  Final      Susceptibility   Staphylococcus aureus - MIC*    CIPROFLOXACIN <=0.5 SENSITIVE Sensitive     GENTAMICIN <=0.5 SENSITIVE Sensitive     OXACILLIN 0.5 SENSITIVE Sensitive     TRIMETH/SULFA <=10 SENSITIVE Sensitive     CEFOXITIN SCREEN NEGATIVE Sensitive     Inducible Clindamycin NEGATIVE Sensitive     TETRACYCLINE Value in next row Sensitive      SENSITIVE<=1    * LIGHT GROWTH STAPHYLOCOCCUS AUREUS   Proteus penneri - MIC*    AMPICILLIN Value in next row Resistant      SENSITIVE<=1    CEFAZOLIN Value in next row Resistant      SENSITIVE<=1    CEFTRIAXONE Value in next row Sensitive      SENSITIVE<=1    CIPROFLOXACIN Value in next row Sensitive      SENSITIVE<=1    GENTAMICIN Value in next row Sensitive      SENSITIVE<=1    IMIPENEM Value in next row Sensitive      SENSITIVE<=1    NITROFURANTOIN Value in next row Resistant      SENSITIVE<=1    TRIMETH/SULFA Value in next row Sensitive      SENSITIVE<=1    PIP/TAZO Value in next row Sensitive      SENSITIVE<=4    * RARE GROWTH PROTEUS PENNERI   Enterococcus faecalis - MIC*    AMPICILLIN Value in next row Sensitive      SENSITIVE<=4    LINEZOLID Value in next row Sensitive      SENSITIVE<=4    * RARE GROWTH ENTEROCOCCUS FAECALIS  Wound culture     Status: None   Collection Time: 06/13/15  1:58 PM  Result Value Ref Range Status   Specimen Description LEG  Final   Special Requests Normal  Final   Gram Stain RARE WBC SEEN FEW GRAM NEGATIVE RODS   Final   Culture NO GROWTH 4 DAYS  Final   Report Status 06/17/2015 FINAL  Final  MRSA PCR Screening     Status: None   Collection Time: 07/01/15  7:40 PM  Result Value Ref Range Status   MRSA by PCR NEGATIVE NEGATIVE Final    Comment:        The GeneXpert MRSA Assay (FDA approved for  NASAL specimens only), is one component of a comprehensive MRSA colonization surveillance program. It is not intended to diagnose MRSA infection nor to guide or monitor treatment for MRSA infections.   Cath Tip Culture     Status: None (Preliminary result)   Collection Time: 07/01/15  9:14 PM  Result Value Ref Range Status   Specimen Description CATH TIP  Final   Special Requests NONE  Final   Culture NO GROWTH 2 DAYS  Final   Report Status PENDING  Incomplete    Coagulation Studies: No results for input(s): LABPROT, INR in the last 72 hours.  Urinalysis: No results for input(s): COLORURINE, LABSPEC, PHURINE, GLUCOSEU, HGBUR, BILIRUBINUR, KETONESUR, PROTEINUR, UROBILINOGEN, NITRITE, LEUKOCYTESUR in the last 72 hours.  Invalid input(s): APPERANCEUR    Imaging: No results found.   Medications:     . feeding supplement  1 Container Oral TID  WC  . furosemide  40 mg Oral BID  . hydrALAZINE  25 mg Oral 3 times per day  . isosorbide mononitrate  30 mg Oral Daily  . lactulose  20 g Oral BID  . metoprolol tartrate  50 mg Oral Q8H  . potassium chloride  10 mEq Intravenous Q1 Hr x 4  . potassium chloride  40 mEq Oral Daily   alteplase, ammonium lactate, anticoagulant sodium citrate, ipratropium-albuterol, lidocaine (PF), lidocaine-prilocaine, LORazepam, ondansetron (ZOFRAN) IV, pentafluoroprop-tetrafluoroeth, senna-docusate  Assessment/ Plan:  64 y.o. male with a PMHx of congestive heart failure ejection fraction 30-35%, prior pneumonia, lower extremity edema with cellulitis, atrial fibrillation who was admitted to [facility] on 06/10/2015 for evaluation of altered mental status and shortness of breath. Upon admission the patient underwent intubation with transition to a mechanical ventilation.  1. Acute renal failure/chronic kidney disease stage III with baseline creatinine 1.3 with proteinuria. The patient most likely has acute renal failure secondary to altered cardiorenal  hemodynamics. His ejection fraction is low at 30%. Versus post strep GN.  - hemodialysis last on 9/2, UF of 2 litres. - Agree with furosemide. Nonoliguric urine output.  - 24 hour urine collection sent. Placed foley catheter for accurate urine output.   2. Acute respiratory failure. From underlying heart failure/pulmonary edema. Had period of intubation. Continue to monitor respiratory status closely.  Continue furosemide  3.generalized edema from  Acute systolic heart failure. EF 30-35%.  4. Hypertension: on metoprolol, hydralazine, and isosorbide mononitrate.    LOS: 23 [place] 9/6/20161:59 PM

## 2015-07-03 NOTE — Progress Notes (Signed)
Physical Therapy Treatment Patient Details Name: Aaron Harrison MRN: [id number] DOB: Jun 21, 1951 Today's Date: 07/03/2015    History of Present Illness presented to ER secondary to weakness, AMS; admitted (and subsequently intubated) for acute hypercarbic/hypoxic respiratory failure secondary to CHF exacerbation.  Hospital course also significant for liver dysfunction, acute encephalopathy and renal failure (requiring CRRT, ended 8/25; now with R temp fem cath); successfully extubated 8/25, currently on 1L O2 via [place].  Patient noted with transfer to CCU over weekend due to SOB/respiratory distress, now improved and returned to floor; new order received to resume PT services this date.    PT Comments    Patient currently requiring min assist for bed mobility; mod assist for sit/stand, basic transfers and short-distance gait.  Additional distance limited by fatigue, though able to maintain sats >91% on 2L throughout session.   Remains globally confused with noted delay in processing and task initiation; often requiring step by step directions (at times, hand-over-hand assist) to initiate all functional activities.  Global strength remains 3+/5 throughout all extremities. Continue to recommend STR upon discharge to maximize safety/indep with functional activities.   Follow Up Recommendations  SNF     Equipment Recommendations       Recommendations for Other Services       Precautions / Restrictions Precautions Precautions: Fall Precaution Comments: R chest perm-cath Restrictions Weight Bearing Restrictions: No    Mobility  Bed Mobility Overal bed mobility: Needs Assistance Bed Mobility: Supine to Sit Rolling: Mod assist   Supine to sit: Min assist     General bed mobility comments: assist for truncal elevation  Transfers Overall transfer level: Needs assistance Equipment used: Rolling walker (2 wheeled) Transfers: Sit to/from Stand Sit to Stand: Mod assist         General  transfer comment: cuing for hand placement; assist for anterior weight shift and lift off  Ambulation/Gait Ambulation/Gait assistance: Mod assist Ambulation Distance (Feet): 25 Feet Assistive device: Rolling walker (2 wheeled)       General Gait Details: 25' x1, 30' x1 with broad BOS with decreased step height/length, forward flexed posture with mod WBing bilat UEs.  Decreased bilat ankle/hip strategies with external perturbations; increased postural sway in all directions.  Requires constant physical assist to prevent LOB.   Stairs            Wheelchair Mobility    Modified Rankin (Stroke Patients Only)       Balance Overall balance assessment: Needs assistance Sitting-balance support: No upper extremity supported;Feet supported Sitting balance-Leahy Scale: Fair       Standing balance-Leahy Scale: Fair                      Cognition Arousal/Alertness: Awake/alert Behavior During Therapy: WFL for tasks assessed/performed Overall Cognitive Status:  (delayed processing, initiation)                      Exercises Other Exercises Other Exercises: Sit/stand with RW, mod assist +1 for lift off and dynamic balance Other Exercises: SPT, bed/chair with RW, mod assist for dynamic balance.  Increased postural sway in all directions, limited balance reactions.    General Comments        Pertinent Vitals/Pain Pain Assessment: No/denies pain    Home Living                      Prior Function  PT Goals (current goals can now be found in the care plan section) Acute Rehab PT Goals Patient Stated Goal: to get stronger PT Goal Formulation: With patient Time For Goal Achievement: 07/07/15 Potential to Achieve Goals: Fair Progress towards PT goals: Progressing toward goals    Frequency  Min 2X/week    PT Plan Current plan remains appropriate    Co-evaluation             End of Session Equipment Utilized During Treatment:  Gait belt Activity Tolerance: Patient tolerated treatment well Patient left: in chair;with call bell/phone within reach;with chair alarm set;with nursing/sitter in room     Time: 0881-1031 PT Time Calculation (min) (ACUTE ONLY): 26 min  Charges:  $Gait Training: 8-22 mins $Therapeutic Activity: 8-22 mins                    G Codes:      Jahnyla Parrillo H. Owens Shark, PT, DPT, NCS 07/03/2015, 4:48 PM [phone]

## 2015-07-03 NOTE — Progress Notes (Signed)
[place] [facility] [place] NAME: Aaron Harrison    MR#:  [id number]  DATE OF BIRTH:  [birth year]  SUBJECTIVE:  Doing well. Breathing better.  REVIEW OF SYSTEMS:   Review of Systems  Constitutional: Negative for fever, chills and weight loss.  HENT: Negative for ear discharge, ear pain and nosebleeds.   Eyes: Negative for blurred vision, pain and discharge.  Respiratory: Negative for sputum production, shortness of breath, wheezing and stridor.   Cardiovascular: Negative for chest pain, palpitations, orthopnea and PND.  Gastrointestinal: Negative for nausea, vomiting, abdominal pain and diarrhea.  Genitourinary: Negative for urgency and frequency.  Musculoskeletal: Negative for back pain and joint pain.  Neurological: Positive for weakness. Negative for sensory change, speech change and focal weakness.  Psychiatric/Behavioral: Negative for depression. The patient is not nervous/anxious.   All other systems reviewed and are negative.  Tolerating Diet:yes Tolerating PT: rec Rehab  DRUG ALLERGIES:   Allergies  Allergen Reactions  . Penicillins Nausea And Vomiting    VITALS:  Blood pressure 138/71, pulse 80, temperature 97.7 F (36.5 C), temperature source Oral, resp. rate 18, height 5\' 8"  (1.727 m), weight 103.8 kg (228 lb 13.4 oz), SpO2 91 %.  PHYSICAL EXAMINATION:   Physical Exam  GENERAL:  64 y.o.-year-old patient lying in the bed with no acute distress. obses EYES: Pupils equal, round, reactive to light and accommodation. No scleral icterus. Extraocular muscles intact.  HEENT: Head atraumatic, normocephalic. Oropharynx and nasopharynx clear.  NECK:  Supple, no jugular venous distention. No thyroid enlargement, no tenderness.  LUNGS: Normal breath sounds bilaterally, no wheezing, rales, rhonchi. No use of accessory muscles of respiration. HD cath + left upper chest CARDIOVASCULAR: S1, S2 normal. No murmurs, rubs, or gallops.   ABDOMEN: Soft, nontender, nondistended. Bowel sounds present. No organomegaly or mass.  EXTREMITIES: No cyanosis, clubbing or edema b/l.    NEUROLOGIC: Cranial nerves II through XII are intact. No focal Motor or sensory deficits b/l.  Overall weak PSYCHIATRIC: The patient is alert and oriented x 2.  SKIN: No obvious rash, lesion, or ulcer.   LABORATORY PANEL:   CBC  Recent Labs Lab 07/03/15 1027  WBC 4.9  HGB 9.8*  HCT 28.9*  PLT 133*    Chemistries   Recent Labs Lab 07/01/15 2114  07/03/15 1027  NA 140  < > 130*  134*  K 3.3*  < > 2.4*  2.5*  CL 99*  < > 86*  90*  CO2 31  < > 27  33*  GLUCOSE 101*  < > 125*  132*  BUN 42*  < > 46*  46*  CREATININE 3.74*  < > 3.91*  3.96*  CALCIUM 8.3*  < > 7.2*  7.7*  MG  --   < > 1.4*  AST 22  --   --   ALT 25  --   --   ALKPHOS 75  --   --   BILITOT 2.5*  --   --   < > = values in this interval not displayed.  Cardiac Enzymes No results for input(s): TROPONINI in the last 168 hours.  RADIOLOGY:  No results found.   ASSESSMENT AND PLAN:   1. Acute respiratory failure with hypoxia- secondary to fluid overload. clinically improved. Diuresing well with IV lasix. Patient placed back on oxygen, we will slowly wean it off. 2. Acute on chronic systolic congestive heart failure- rales at the bases. Stat chest x-ray consistent with heart  failure.The current medical regimen is effective;  continue present plan and medications. Lasix IV bid, replete K  Patient is on metoprolol. No ACE inhibitor at this point while trying to figure out whether his kidney function is going to get better or not. Added imdur. 3. Acute renal failure on chronic kidney disease stage III- could be ATN or poststreptococcal. Nephrology trying to collect 24-hour urine, started today a.m., to see if patient is endstage renal disease or not. 4. Essential hypertension continue metoprolol 5. Clinical sepsis, pneumonia, bilateral lower extending cellulitis-  patient finished antibiotics course here in the hospital. MSSA and enterococcus pneumonia. 6. Rapid atrial fibrillation on presentation - rate controlled at this time. Continue metoprolol for rate control, aspirin 7. Hyperkalemia on presentation- improved with dialysis, now with hypokalemia. Will supplement potassium as needed basis. Check a.m. Labs 8. Generalized weakness-consult physical therapy noted. Recommends STR. Pt has not payor source. CSW working on it   Case discussed with Care Management/Social Worker. Management plans discussed with the patient and  in agreement.  CODE STATUS: full  DVT Prophylaxis: heparin  TOTAL TIME TAKING CARE OF THIS PATIENT: 30 minutes.  >50% time spent on counselling and coordination of care pt,CSW,RN  POSSIBLE D/C IN next several DAYS, DEPENDING ON CLINICAL CONDITION.   Shetara Launer M.D on 07/03/2015 at 5:11 PM  Between 7am to 6pm - Pager - [phone]  After 6pm go to [email or web address] - password [email or web address] [facility]  [place] [facility]  Office  [phone]  CC: Primary care physician; No PCP Per Patient

## 2015-07-04 LAB — CBC
HCT: 28.9 % — ABNORMAL LOW (ref 40.0–52.0)
HEMOGLOBIN: 9.8 g/dL — AB (ref 13.0–18.0)
MCH: 34 pg (ref 26.0–34.0)
MCHC: 34.1 g/dL (ref 32.0–36.0)
MCV: 99.9 fL (ref 80.0–100.0)
Platelets: 146 10*3/uL — ABNORMAL LOW (ref 150–440)
RBC: 2.89 MIL/uL — ABNORMAL LOW (ref 4.40–5.90)
RDW: 15.4 % — AB (ref 11.5–14.5)
WBC: 3.8 10*3/uL (ref 3.8–10.6)

## 2015-07-04 LAB — MAGNESIUM: MAGNESIUM: 1.4 mg/dL — AB (ref 1.7–2.4)

## 2015-07-04 LAB — RENAL FUNCTION PANEL
ALBUMIN: 3.2 g/dL — AB (ref 3.5–5.0)
ANION GAP: 12 (ref 5–15)
BUN: 43 mg/dL — ABNORMAL HIGH (ref 6–20)
CALCIUM: 8 mg/dL — AB (ref 8.9–10.3)
CO2: 35 mmol/L — ABNORMAL HIGH (ref 22–32)
Chloride: 91 mmol/L — ABNORMAL LOW (ref 101–111)
Creatinine, Ser: 3.44 mg/dL — ABNORMAL HIGH (ref 0.61–1.24)
GFR calc Af Amer: 20 mL/min — ABNORMAL LOW (ref >60)
GFR, EST NON AFRICAN AMERICAN: 18 mL/min — AB (ref >60)
GLUCOSE: 81 mg/dL (ref 65–99)
PHOSPHORUS: 3.6 mg/dL (ref 2.5–4.6)
Potassium: 2.9 mmol/L — CL (ref 3.5–5.1)
SODIUM: 138 mmol/L (ref 135–145)

## 2015-07-04 LAB — CATH TIP CULTURE: Culture: NO GROWTH

## 2015-07-04 MED ORDER — CHLORHEXIDINE GLUCONATE CLOTH 2 % EX PADS
6.0000 | MEDICATED_PAD | Freq: Once | CUTANEOUS | Status: AC
  Administered 2015-07-04: 6 via TOPICAL

## 2015-07-04 MED ORDER — POTASSIUM CHLORIDE 10 MEQ/100ML IV SOLN
10.0000 meq | INTRAVENOUS | Status: AC
  Administered 2015-07-04 (×4): 10 meq via INTRAVENOUS
  Filled 2015-07-04 (×4): qty 100

## 2015-07-04 MED ORDER — SODIUM CHLORIDE 0.9 % IV SOLN
INTRAVENOUS | Status: DC
  Administered 2015-07-05: 01:00:00 via INTRAVENOUS

## 2015-07-04 MED ORDER — MAGNESIUM OXIDE 400 (241.3 MG) MG PO TABS
400.0000 mg | ORAL_TABLET | Freq: Every day | ORAL | Status: DC
  Administered 2015-07-04 – 2015-07-12 (×9): 400 mg via ORAL
  Filled 2015-07-04 (×9): qty 1

## 2015-07-04 MED ORDER — FUROSEMIDE 40 MG PO TABS
40.0000 mg | ORAL_TABLET | Freq: Every day | ORAL | Status: DC
  Administered 2015-07-05 – 2015-07-10 (×6): 40 mg via ORAL
  Filled 2015-07-04 (×7): qty 1

## 2015-07-04 MED ORDER — MAGNESIUM SULFATE 4 GM/100ML IV SOLN
4.0000 g | Freq: Once | INTRAVENOUS | Status: AC
  Administered 2015-07-04: 4 g via INTRAVENOUS
  Filled 2015-07-04: qty 100

## 2015-07-04 NOTE — Progress Notes (Signed)
Subjective:   Patient transferred to [facility].  UOP 4750 (3200) Creatinine 3.61 (3.96)  K 2.9 - IV KCl ordered.   Objective:  Vital signs in last 24 hours:  Temp:  [97.7 F (36.5 C)-98.6 F (37 C)] 98.6 F (37 C) (09/07 0745) Pulse Rate:  [75-80] 75 (09/07 0745) Resp:  [17-18] 17 (09/07 0745) BP: (122-138)/(56-71) 132/67 mmHg (09/07 0745) SpO2:  [91 %-100 %] 98 % (09/07 0745) Weight:  [102.8 kg (226 lb 10.1 oz)] 102.8 kg (226 lb 10.1 oz) (09/07 0500)  Weight change: -1 kg (-2 lb 3.3 oz) Filed Weights   07/02/15 0500 07/03/15 0500 07/04/15 0500  Weight: 104.2 kg (229 lb 11.5 oz) 103.8 kg (228 lb 13.4 oz) 102.8 kg (226 lb 10.1 oz)    Intake/Output: I/O last 3 completed shifts: In: 9381 [P.O.:1120; IV Piggyback:220] Out: 0175 [Urine:7950]   Intake/Output this shift:  Total I/O In: 0  Out: 600 [Urine:600]  Physical Exam: General: NAD  Head: Normocephalic, atraumatic.  ENT Moist mucus membranes hearing intact  Neck: Supple, trachea midline  Lungs:  clear  Heart: Irregular S1S2 no rubs  Abdomen:  nontender, mild distension, BS [resemt  Extremities:  trace generalized edema  Neurologic:  resting comfortably   Skin: ulcertaion bilateral lower extremities on shins - in UNNA boots  Access: RIJ permcath 8/31 Dr. Lucky Cowboy    Basic Metabolic Panel:  Recent Labs Lab 06/30/15 0500 07/01/15 0539 07/01/15 2114 07/02/15 0801 07/03/15 1027 07/04/15 0709  NA 138 138 140 139 130*  134* 139  K 3.0* 3.1* 3.3* 3.1* 2.4*  2.5* 2.8*  CL 102 99* 99* 98* 86*  90* 91*  CO2 29 29 31 30 27   33* 37*  GLUCOSE 93 112* 101* 88 125*  132* 87  BUN 36* 38* 42* 41* 46*  46* 44*  CREATININE 3.50* 3.79* 3.74* 3.97* 3.91*  3.96* 3.61*  CALCIUM 7.8* 8.2* 8.3* 8.1* 7.2*  7.7* 7.8*  MG 1.8 1.9  --  1.7 1.4* 1.4*  PHOS 3.7 4.3  --  5.4* 3.8 3.6    Liver Function Tests:  Recent Labs Lab 07/01/15 0539 07/01/15 2114 07/02/15 0801 07/03/15 1027 07/04/15 0709  AST  --  22  --   --   --    ALT  --  25  --   --   --   ALKPHOS  --  75  --   --   --   BILITOT  --  2.5*  --   --   --   PROT  --  6.4*  --   --   --   ALBUMIN 3.3* 3.0* 2.9* 3.0* 3.1*   No results for input(s): LIPASE, AMYLASE in the last 168 hours. No results for input(s): AMMONIA in the last 168 hours.  CBC:  Recent Labs Lab 06/29/15 0344 07/01/15 2114 07/03/15 1027 07/04/15 0709  WBC  --  5.6 4.9 3.8  NEUTROABS  --  4.4  --   --   HGB  --  8.6* 9.8* 9.8*  HCT  --  25.7* 28.9* 28.9*  MCV  --  101.7* 100.6* 99.9  PLT 87* 99* 133* 146*    Cardiac Enzymes: No results for input(s): CKTOTAL, CKMB, CKMBINDEX, TROPONINI in the last 168 hours.  BNP: Invalid input(s): POCBNP  CBG:  Recent Labs Lab 07/01/15 2002  GLUCAP 126*    Microbiology: Results for orders placed or performed during the hospital encounter of 06/10/15  Blood Culture (routine x 2)  Status: None   Collection Time: 06/10/15  2:35 PM  Result Value Ref Range Status   Specimen Description BLOOD RIGHT WRIST  Final   Special Requests BOTTLES DRAWN AEROBIC AND ANAEROBIC  1CC  Final   Culture NO GROWTH 5 DAYS  Final   Report Status 06/15/2015 FINAL  Final  Blood Culture (routine x 2)     Status: None   Collection Time: 06/10/15  2:40 PM  Result Value Ref Range Status   Specimen Description BLOOD LEFT ASSIST CONTROL  Final   Special Requests   Final    BOTTLES DRAWN AEROBIC AND ANAEROBIC  AER 6CC ANA 4CC   Culture NO GROWTH 5 DAYS  Final   Report Status 06/15/2015 FINAL  Final  Urine culture     Status: None   Collection Time: 06/10/15  4:42 PM  Result Value Ref Range Status   Specimen Description URINE, RANDOM  Final   Special Requests NONE  Final   Culture 4,000 COLONIES/mL INSIGNIFICANT GROWTH  Final   Report Status 06/12/2015 FINAL  Final  MRSA PCR Screening     Status: None   Collection Time: 06/10/15  6:27 PM  Result Value Ref Range Status   MRSA by PCR NEGATIVE NEGATIVE Final    Comment:        The GeneXpert  MRSA Assay (FDA approved for NASAL specimens only), is one component of a comprehensive MRSA colonization surveillance program. It is not intended to diagnose MRSA infection nor to guide or monitor treatment for MRSA infections.   Culture, sputum-assessment     Status: None   Collection Time: 06/11/15  2:50 PM  Result Value Ref Range Status   Specimen Description SPUTUM  Final   Special Requests NONE  Final   Sputum evaluation THIS SPECIMEN IS ACCEPTABLE FOR SPUTUM CULTURE  Final   Report Status 06/13/2015 FINAL  Final  Culture, respiratory (NON-Expectorated)     Status: None   Collection Time: 06/11/15  2:50 PM  Result Value Ref Range Status   Specimen Description SPUTUM  Final   Special Requests NONE Reflexed from [id number]  Final   Gram Stain   Final    MODERATE WBC SEEN RARE GRAM POSITIVE COCCI GOOD SPECIMEN - 80-90% WBCS    Culture MODERATE GROWTH STAPHYLOCOCCUS AUREUS  Final   Report Status 06/14/2015 FINAL  Final   Organism ID, Bacteria STAPHYLOCOCCUS AUREUS  Final      Susceptibility   Staphylococcus aureus - MIC*    CIPROFLOXACIN <=0.5 SENSITIVE Sensitive     GENTAMICIN <=0.5 SENSITIVE Sensitive     OXACILLIN 0.5 SENSITIVE Sensitive     TRIMETH/SULFA <=10 SENSITIVE Sensitive     CEFOXITIN SCREEN NEGATIVE Sensitive     Inducible Clindamycin NEGATIVE Sensitive     TETRACYCLINE Value in next row Sensitive      SENSITIVE<=1    * MODERATE GROWTH STAPHYLOCOCCUS AUREUS  Wound culture     Status: None   Collection Time: 06/13/15  1:56 PM  Result Value Ref Range Status   Specimen Description LEG  Final   Special Requests Normal  Final   Gram Stain   Final    FEW WBC SEEN MODERATE GRAM NEGATIVE RODS FEW GRAM POSITIVE COCCI    Culture   Final    LIGHT GROWTH STAPHYLOCOCCUS AUREUS RARE GROWTH PROTEUS PENNERI RARE GROWTH ENTEROCOCCUS FAECALIS    Report Status 06/17/2015 FINAL  Final   Organism ID, Bacteria STAPHYLOCOCCUS AUREUS  Final   Organism ID, Bacteria PROTEUS  PENNERI  Final   Organism ID, Bacteria ENTEROCOCCUS FAECALIS  Final      Susceptibility   Staphylococcus aureus - MIC*    CIPROFLOXACIN <=0.5 SENSITIVE Sensitive     GENTAMICIN <=0.5 SENSITIVE Sensitive     OXACILLIN 0.5 SENSITIVE Sensitive     TRIMETH/SULFA <=10 SENSITIVE Sensitive     CEFOXITIN SCREEN NEGATIVE Sensitive     Inducible Clindamycin NEGATIVE Sensitive     TETRACYCLINE Value in next row Sensitive      SENSITIVE<=1    * LIGHT GROWTH STAPHYLOCOCCUS AUREUS   Proteus penneri - MIC*    AMPICILLIN Value in next row Resistant      SENSITIVE<=1    CEFAZOLIN Value in next row Resistant      SENSITIVE<=1    CEFTRIAXONE Value in next row Sensitive      SENSITIVE<=1    CIPROFLOXACIN Value in next row Sensitive      SENSITIVE<=1    GENTAMICIN Value in next row Sensitive      SENSITIVE<=1    IMIPENEM Value in next row Sensitive      SENSITIVE<=1    NITROFURANTOIN Value in next row Resistant      SENSITIVE<=1    TRIMETH/SULFA Value in next row Sensitive      SENSITIVE<=1    PIP/TAZO Value in next row Sensitive      SENSITIVE<=4    * RARE GROWTH PROTEUS PENNERI   Enterococcus faecalis - MIC*    AMPICILLIN Value in next row Sensitive      SENSITIVE<=4    LINEZOLID Value in next row Sensitive      SENSITIVE<=4    * RARE GROWTH ENTEROCOCCUS FAECALIS  Wound culture     Status: None   Collection Time: 06/13/15  1:58 PM  Result Value Ref Range Status   Specimen Description LEG  Final   Special Requests Normal  Final   Gram Stain RARE WBC SEEN FEW GRAM NEGATIVE RODS   Final   Culture NO GROWTH 4 DAYS  Final   Report Status 06/17/2015 FINAL  Final  MRSA PCR Screening     Status: None   Collection Time: 07/01/15  7:40 PM  Result Value Ref Range Status   MRSA by PCR NEGATIVE NEGATIVE Final    Comment:        The GeneXpert MRSA Assay (FDA approved for NASAL specimens only), is one component of a comprehensive MRSA colonization surveillance program. It is not intended to  diagnose MRSA infection nor to guide or monitor treatment for MRSA infections.   Cath Tip Culture     Status: None (Preliminary result)   Collection Time: 07/01/15  9:14 PM  Result Value Ref Range Status   Specimen Description CATH TIP  Final   Special Requests NONE  Final   Culture NO GROWTH 2 DAYS  Final   Report Status PENDING  Incomplete    Coagulation Studies: No results for input(s): LABPROT, INR in the last 72 hours.  Urinalysis: No results for input(s): COLORURINE, LABSPEC, PHURINE, GLUCOSEU, HGBUR, BILIRUBINUR, KETONESUR, PROTEINUR, UROBILINOGEN, NITRITE, LEUKOCYTESUR in the last 72 hours.  Invalid input(s): APPERANCEUR    Imaging: No results found.   Medications:     . feeding supplement  1 Container Oral TID WC  . furosemide  40 mg Oral BID  . hydrALAZINE  25 mg Oral 3 times per day  . isosorbide mononitrate  30 mg Oral Daily  . lactulose  20 g Oral BID  . metoprolol tartrate  50 mg Oral Q8H  . potassium chloride  10 mEq Intravenous Q1 Hr x 4  . potassium chloride  40 mEq Oral Daily   alteplase, ammonium lactate, anticoagulant sodium citrate, ipratropium-albuterol, lidocaine (PF), lidocaine-prilocaine, ondansetron (ZOFRAN) IV, pentafluoroprop-tetrafluoroeth, senna-docusate  Assessment/ Plan:  64 y.o. male with a PMHx of congestive heart failure ejection fraction 30-35%, prior pneumonia, lower extremity edema with cellulitis, atrial fibrillation who was admitted to [facility] on 06/10/2015 for evaluation of altered mental status and shortness of breath. Upon admission the patient underwent intubation with transition to a mechanical ventilation.  1. Acute renal failure/chronic kidney disease stage III with baseline creatinine 1.3 with proteinuria. The patient most likely has acute renal failure secondary to altered cardiorenal hemodynamics. His ejection fraction is low at 30%. Versus post strep GN.  - hemodialysis last on 9/2, UF of 2 litres. Will look into taking out  permcath - Agree with furosemide, furosemide dose reduced to 40mg  daily - Nonoliguric urine output.  - discontinue foley catheter.   2. Hypokalemia: due to post ATN diuresis and furosemide - potassium replaced IV and PO.   3. Acute respiratory failure. From underlying heart failure/pulmonary edema. Had period of intubation. Continue to monitor respiratory status closely.  Continue furosemide  4.generalized edema from  Acute systolic heart failure. EF 30-35%. - furosemide  5. Hypertension: on metoprolol, hydralazine, and isosorbide mononitrate.    LOS: [place], [place] 9/7/20169:46 AM

## 2015-07-04 NOTE — Clinical Social Work Note (Signed)
Patient will not require outpatient dialysis. It is still recommended that patient go for STR but patient has no payor source and would need to be an LOG (letter of guarantee). CSW has faxed patient's referral to the facilities that will contract with [facility] for an LOG and currently there are no offers. CSW will continue to try to find placement. Shela Leff MSW,LCSW [phone]

## 2015-07-04 NOTE — Progress Notes (Signed)
[place] at [place] NAME: Aaron Harrison    MR#:  [id number]  DATE OF BIRTH:  01-Jan-1951  SUBJECTIVE:  Doing well. Breathing better.  REVIEW OF SYSTEMS:   Review of Systems  Constitutional: Negative for fever, chills and weight loss.  HENT: Negative for ear discharge, ear pain and nosebleeds.   Eyes: Negative for blurred vision, pain and discharge.  Respiratory: Negative for sputum production, shortness of breath, wheezing and stridor.   Cardiovascular: Negative for chest pain, palpitations, orthopnea and PND.  Gastrointestinal: Negative for nausea, vomiting, abdominal pain and diarrhea.  Genitourinary: Negative for urgency and frequency.  Musculoskeletal: Negative for back pain and joint pain.  Neurological: Positive for weakness. Negative for sensory change, speech change and focal weakness.  Psychiatric/Behavioral: Negative for depression. The patient is not nervous/anxious.   All other systems reviewed and are negative.  Tolerating Diet:yes Tolerating PT: rec Rehab  DRUG ALLERGIES:   Allergies  Allergen Reactions  . Penicillins Nausea And Vomiting    VITALS:  Blood pressure 132/67, pulse 75, temperature 98.6 F (37 C), temperature source Oral, resp. rate 17, height 5\' 8"  (1.727 m), weight 102.8 kg (226 lb 10.1 oz), SpO2 98 %.  PHYSICAL EXAMINATION:   Physical Exam  GENERAL:  64 y.o.-year-old patient lying in the bed with no acute distress. obses EYES: Pupils equal, round, reactive to light and accommodation. No scleral icterus. Extraocular muscles intact.  HEENT: Head atraumatic, normocephalic. Oropharynx and nasopharynx clear.  NECK:  Supple, no jugular venous distention. No thyroid enlargement, no tenderness.  LUNGS: Normal breath sounds bilaterally, no wheezing, rales, rhonchi. No use of accessory muscles of respiration. HD cath + left upper chest CARDIOVASCULAR: S1, S2 normal. No murmurs, rubs, or gallops.   ABDOMEN: Soft, nontender, nondistended. Bowel sounds present. No organomegaly or mass.  EXTREMITIES: No cyanosis, clubbing or edema b/l.    NEUROLOGIC: Cranial nerves II through XII are intact. No focal Motor or sensory deficits b/l.  Overall weak PSYCHIATRIC: The patient is alert and oriented x 2.  SKIN: No obvious rash, lesion, or ulcer.   LABORATORY PANEL:   CBC  Recent Labs Lab 07/04/15 0709  WBC 3.8  HGB 9.8*  HCT 28.9*  PLT 146*    Chemistries   Recent Labs Lab 07/01/15 2114  07/04/15 0709 07/04/15 0813  NA 140  < > 139 138  K 3.3*  < > 2.8* 2.9*  CL 99*  < > 91* 91*  CO2 31  < > 37* 35*  GLUCOSE 101*  < > 87 81  BUN 42*  < > 44* 43*  CREATININE 3.74*  < > 3.61* 3.44*  CALCIUM 8.3*  < > 7.8* 8.0*  MG  --   < > 1.4*  --   AST 22  --   --   --   ALT 25  --   --   --   ALKPHOS 75  --   --   --   BILITOT 2.5*  --   --   --   < > = values in this interval not displayed.  Cardiac Enzymes No results for input(s): TROPONINI in the last 168 hours.  RADIOLOGY:  No results found.   ASSESSMENT AND PLAN:   1. Acute respiratory failure with hypoxia- secondary to fluid overload. clinically improved. Diuresing well with po lasix. Patient placed back on oxygen, we will slowly wean it off. 2. Acute on chronic systolic congestive heart failure- rales  at the bases.  chest x-ray consistent with heart failure. current medical regimen is effective;  continue present plan and medications. Lasix po daily, replete K  Patient is on metoprolol. No ACE inhibitor at this point while trying to figure out whether his kidney function is going to get better or not. Added imdur. 3. Acute renal failure on chronic kidney disease stage III- could be ATN or poststreptococcal. per nephrology no need for HD. HD catheter to be removed in am 4. Essential hypertension continue metoprolol, imdur 5. Clinical sepsis, pneumonia, bilateral lower extending cellulitis- patient finished antibiotics course  here in the hospital. MSSA and enterococcus pneumonia. 6. Rapid atrial fibrillation on presentation - rate controlled at this time. Continue metoprolol for rate control, aspirin 7. Hyperkalemia on presentation- improved with dialysis, now with hypokalemia. Will supplement potassium as needed basis.  8. Generalized weakness-consult physical therapy noted. Recommends STR. Pt has not payor source. CSW working on it  Case discussed with Care Management/Social Worker. Management plans discussed with the patient and  in agreement.  CODE STATUS: full  DVT Prophylaxis: heparin  TOTAL TIME TAKING CARE OF THIS PATIENT: 30 minutes.  >50% time spent on counselling and coordination of care pt,CSW,RN  POSSIBLE D/C IN next several DAYS, DEPENDING ON CLINICAL CONDITION.   Tyreisha Ungar M.D on 07/04/2015 at 1:19 PM  Between 7am to 6pm - Pager - [phone]  After 6pm go to [email or web address] - password [email or web address] [facility]  [facility]  Office  [phone]  CC: Primary care physician; No PCP Per Patient

## 2015-07-05 ENCOUNTER — Encounter
Admission: EM | Disposition: A | Discharge status: Home / Self Care | Payer: Self-pay | Source: Home / Self Care | Attending: Internal Medicine

## 2015-07-05 HISTORY — PX: PERIPHERAL VASCULAR CATHETERIZATION: SHX172C

## 2015-07-05 LAB — CREATININE CLEARANCE, URINE, 24 HOUR
COLLECTION INTERVAL-CRCL: 24 h
CREAT CLEAR: 22 mL/min — AB (ref 75–125)
CREATININE 24H UR: 1104 mg/d (ref 800–2000)
Collection Interval-CRCL: 24 hours
Creatinine, Urine: 23 mg/dL
Urine Total Volume-CRCL: 4800 mL

## 2015-07-05 LAB — RENAL FUNCTION PANEL
ALBUMIN: 3.3 g/dL — AB (ref 3.5–5.0)
Anion gap: 15 (ref 5–15)
BUN: 43 mg/dL — AB (ref 6–20)
CALCIUM: 8.1 mg/dL — AB (ref 8.9–10.3)
CO2: 32 mmol/L (ref 22–32)
CREATININE: 3.41 mg/dL — AB (ref 0.61–1.24)
Chloride: 90 mmol/L — ABNORMAL LOW (ref 101–111)
GFR, EST AFRICAN AMERICAN: 21 mL/min — AB (ref >60)
GFR, EST NON AFRICAN AMERICAN: 18 mL/min — AB (ref >60)
Glucose, Bld: 87 mg/dL (ref 65–99)
Phosphorus: 3.7 mg/dL (ref 2.5–4.6)
Potassium: 3.2 mmol/L — ABNORMAL LOW (ref 3.5–5.1)
SODIUM: 137 mmol/L (ref 135–145)

## 2015-07-05 SURGERY — DIALYSIS/PERMA CATHETER REMOVAL
Anesthesia: LOCAL | Laterality: Right

## 2015-07-05 MED ORDER — BACITRACIN-NEOMYCIN-POLYMYXIN 400-5-5000 EX OINT
TOPICAL_OINTMENT | CUTANEOUS | Status: AC
Start: 2015-07-05 — End: 2015-07-05
  Administered 2015-07-05: 12:00:00
  Filled 2015-07-05: qty 1

## 2015-07-05 MED ORDER — LIDOCAINE-EPINEPHRINE (PF) 1 %-1:200000 IJ SOLN
INTRAMUSCULAR | Status: AC
  Filled 2015-07-05: qty 60

## 2015-07-05 MED ORDER — HEPARIN SODIUM (PORCINE) 5000 UNIT/ML IJ SOLN
5000.0000 [IU] | Freq: Three times a day (TID) | INTRAMUSCULAR | Status: DC
  Filled 2015-07-05 (×8): qty 1

## 2015-07-05 SURGICAL SUPPLY — 2 items
TOWEL OR 17X26 4PK STRL BLUE (TOWEL DISPOSABLE) ×2 IMPLANT
TRAY LACERAT/PLASTIC (MISCELLANEOUS) ×2 IMPLANT

## 2015-07-05 NOTE — Progress Notes (Signed)
Pharmacy Note - HIT antibody testing  Patient with HIT antibody labs ordered 9/2. Spoke with lab in regards to orders. It appears that results are back from [facility] however are not crossing over to [facility]. Results are as follows:  9/2 Heparin induced platelet antibody: 0.156 OD Reference range: 0.000 to 0.400  Conveyed results to [name]. Results not indicative of heparin allergy. Will resume subcutaneous heparin for DVT prophylaxis per [name].  Rexene Edison, PharmD Clinical Pharmacist  07/05/2015

## 2015-07-05 NOTE — Op Note (Signed)
Operative Note     Preoperative diagnosis:   1. ESRD with functional permanent access  Postoperative diagnosis:  1. ESRD with functional permanent access  Procedure:  Removal of right jugular Permcath  Surgeon:  Leotis Pain, MD  Anesthesia:  Local  EBL:  Minimal  Indication for the Procedure:  The patient has a functional permanent dialysis access and no longer needs their permcath.  This can be removed.  Risks and benefits are discussed and informed consent is obtained.  Description of the Procedure:  The patient's right neck, chest and existing catheter were sterilely prepped and draped. The area around the catheter was anesthetized copiously with 1% lidocaine. The catheter was dissected out with curved hemostats until the cuff was freed from the surrounding fibrous sheath. The fiber sheath was transected, and the catheter was then removed in its entirety using gentle traction. Pressure was held and sterile dressings were placed. The patient tolerated the procedure well and was taken to the recovery room in stable condition.     DEW,JASON  07/05/2015, 11:17 AM

## 2015-07-05 NOTE — Progress Notes (Signed)
PT Cancellation Note  Patient Details Name: KWESI SANGHA MRN: [id number] DOB: 1950-11-01   Cancelled Treatment:    Reason Eval/Treat Not Completed: Patient at procedure or test/unavailable (Treatment session attempted; patient currently off unit for procedure (perm-cath removal).  Will re-attempt at later time/date as medically appropriate.)   Johanne Mcglade H. Owens Shark, PT, DPT, NCS 07/05/2015, 11:17 AM [phone]

## 2015-07-05 NOTE — Progress Notes (Signed)
Subjective:   UOP 1590 Labs pending this morning.   Objective:  Vital signs in last 24 hours:  Temp:  [98.2 F (36.8 C)-98.9 F (37.2 C)] 98.2 F (36.8 C) (09/08 0751) Pulse Rate:  [71-94] 71 (09/08 0751) Resp:  [17] 17 (09/08 0751) BP: (100-143)/(51-78) 123/54 mmHg (09/08 0751) SpO2:  [98 %-99 %] 98 % (09/08 0751) Weight:  [97.251 kg (214 lb 6.4 oz)] 97.251 kg (214 lb 6.4 oz) (09/08 0300)  Weight change: -5.549 kg (-12 lb 3.7 oz) Filed Weights   07/03/15 0500 07/04/15 0500 07/05/15 0300  Weight: 103.8 kg (228 lb 13.4 oz) 102.8 kg (226 lb 10.1 oz) 97.251 kg (214 lb 6.4 oz)    Intake/Output: I/O last 3 completed shifts: In: 5003 [P.O.:900; I.V.:260; IV Piggyback:100] Out: 4990 [Urine:4990]   Intake/Output this shift:  Total I/O In: 80 [I.V.:80] Out: 125 [Urine:125]  Physical Exam: General: NAD  Head: Normocephalic, atraumatic.  ENT Moist mucus membranes hearing intact  Neck: Supple, trachea midline  Lungs:  clear  Heart: Irregular S1S2 no rubs  Abdomen:  nontender, mild distension, BS [resemt  Extremities:  trace generalized edema  Neurologic:  resting comfortably   Skin: ulcertaion bilateral lower extremities on shins - in UNNA boots  Access: RIJ permcath 8/31 Dr. Lucky Cowboy    Basic Metabolic Panel:  Recent Labs Lab 06/30/15 0500 07/01/15 0539 07/01/15 2114 07/02/15 0801 07/03/15 1027 07/04/15 0709 07/04/15 0813  NA 138 138 140 139 130*  134* 139 138  K 3.0* 3.1* 3.3* 3.1* 2.4*  2.5* 2.8* 2.9*  CL 102 99* 99* 98* 86*  90* 91* 91*  CO2 29 29 31 30 27   33* 37* 35*  GLUCOSE 93 112* 101* 88 125*  132* 87 81  BUN 36* 38* 42* 41* 46*  46* 44* 43*  CREATININE 3.50* 3.79* 3.74* 3.97* 3.91*  3.96* 3.61* 3.44*  CALCIUM 7.8* 8.2* 8.3* 8.1* 7.2*  7.7* 7.8* 8.0*  MG 1.8 1.9  --  1.7 1.4* 1.4*  --   PHOS 3.7 4.3  --  5.4* 3.8 3.6 3.6    Liver Function Tests:  Recent Labs Lab 07/01/15 2114 07/02/15 0801 07/03/15 1027 07/04/15 0709 07/04/15 0813  AST  22  --   --   --   --   ALT 25  --   --   --   --   ALKPHOS 75  --   --   --   --   BILITOT 2.5*  --   --   --   --   PROT 6.4*  --   --   --   --   ALBUMIN 3.0* 2.9* 3.0* 3.1* 3.2*   No results for input(s): LIPASE, AMYLASE in the last 168 hours. No results for input(s): AMMONIA in the last 168 hours.  CBC:  Recent Labs Lab 06/29/15 0344 07/01/15 2114 07/03/15 1027 07/04/15 0709  WBC  --  5.6 4.9 3.8  NEUTROABS  --  4.4  --   --   HGB  --  8.6* 9.8* 9.8*  HCT  --  25.7* 28.9* 28.9*  MCV  --  101.7* 100.6* 99.9  PLT 87* 99* 133* 146*    Cardiac Enzymes: No results for input(s): CKTOTAL, CKMB, CKMBINDEX, TROPONINI in the last 168 hours.  BNP: Invalid input(s): POCBNP  CBG:  Recent Labs Lab 07/01/15 2002  GLUCAP 126*    Microbiology: Results for orders placed or performed during the hospital encounter of 06/10/15  Blood Culture (routine  x 2)     Status: None   Collection Time: 06/10/15  2:35 PM  Result Value Ref Range Status   Specimen Description BLOOD RIGHT WRIST  Final   Special Requests BOTTLES DRAWN AEROBIC AND ANAEROBIC  1CC  Final   Culture NO GROWTH 5 DAYS  Final   Report Status 06/15/2015 FINAL  Final  Blood Culture (routine x 2)     Status: None   Collection Time: 06/10/15  2:40 PM  Result Value Ref Range Status   Specimen Description BLOOD LEFT ASSIST CONTROL  Final   Special Requests   Final    BOTTLES DRAWN AEROBIC AND ANAEROBIC  AER 6CC ANA 4CC   Culture NO GROWTH 5 DAYS  Final   Report Status 06/15/2015 FINAL  Final  Urine culture     Status: None   Collection Time: 06/10/15  4:42 PM  Result Value Ref Range Status   Specimen Description URINE, RANDOM  Final   Special Requests NONE  Final   Culture 4,000 COLONIES/mL INSIGNIFICANT GROWTH  Final   Report Status 06/12/2015 FINAL  Final  MRSA PCR Screening     Status: None   Collection Time: 06/10/15  6:27 PM  Result Value Ref Range Status   MRSA by PCR NEGATIVE NEGATIVE Final    Comment:         The GeneXpert MRSA Assay (FDA approved for NASAL specimens only), is one component of a comprehensive MRSA colonization surveillance program. It is not intended to diagnose MRSA infection nor to guide or monitor treatment for MRSA infections.   Culture, sputum-assessment     Status: None   Collection Time: 06/11/15  2:50 PM  Result Value Ref Range Status   Specimen Description SPUTUM  Final   Special Requests NONE  Final   Sputum evaluation THIS SPECIMEN IS ACCEPTABLE FOR SPUTUM CULTURE  Final   Report Status 06/13/2015 FINAL  Final  Culture, respiratory (NON-Expectorated)     Status: None   Collection Time: 06/11/15  2:50 PM  Result Value Ref Range Status   Specimen Description SPUTUM  Final   Special Requests NONE Reflexed from [id number]  Final   Gram Stain   Final    MODERATE WBC SEEN RARE GRAM POSITIVE COCCI GOOD SPECIMEN - 80-90% WBCS    Culture MODERATE GROWTH STAPHYLOCOCCUS AUREUS  Final   Report Status 06/14/2015 FINAL  Final   Organism ID, Bacteria STAPHYLOCOCCUS AUREUS  Final      Susceptibility   Staphylococcus aureus - MIC*    CIPROFLOXACIN <=0.5 SENSITIVE Sensitive     GENTAMICIN <=0.5 SENSITIVE Sensitive     OXACILLIN 0.5 SENSITIVE Sensitive     TRIMETH/SULFA <=10 SENSITIVE Sensitive     CEFOXITIN SCREEN NEGATIVE Sensitive     Inducible Clindamycin NEGATIVE Sensitive     TETRACYCLINE Value in next row Sensitive      SENSITIVE<=1    * MODERATE GROWTH STAPHYLOCOCCUS AUREUS  Wound culture     Status: None   Collection Time: 06/13/15  1:56 PM  Result Value Ref Range Status   Specimen Description LEG  Final   Special Requests Normal  Final   Gram Stain   Final    FEW WBC SEEN MODERATE GRAM NEGATIVE RODS FEW GRAM POSITIVE COCCI    Culture   Final    LIGHT GROWTH STAPHYLOCOCCUS AUREUS RARE GROWTH PROTEUS PENNERI RARE GROWTH ENTEROCOCCUS FAECALIS    Report Status 06/17/2015 FINAL  Final   Organism ID, Bacteria STAPHYLOCOCCUS AUREUS  Final  Organism  ID, Bacteria PROTEUS PENNERI  Final   Organism ID, Bacteria ENTEROCOCCUS FAECALIS  Final      Susceptibility   Staphylococcus aureus - MIC*    CIPROFLOXACIN <=0.5 SENSITIVE Sensitive     GENTAMICIN <=0.5 SENSITIVE Sensitive     OXACILLIN 0.5 SENSITIVE Sensitive     TRIMETH/SULFA <=10 SENSITIVE Sensitive     CEFOXITIN SCREEN NEGATIVE Sensitive     Inducible Clindamycin NEGATIVE Sensitive     TETRACYCLINE Value in next row Sensitive      SENSITIVE<=1    * LIGHT GROWTH STAPHYLOCOCCUS AUREUS   Proteus penneri - MIC*    AMPICILLIN Value in next row Resistant      SENSITIVE<=1    CEFAZOLIN Value in next row Resistant      SENSITIVE<=1    CEFTRIAXONE Value in next row Sensitive      SENSITIVE<=1    CIPROFLOXACIN Value in next row Sensitive      SENSITIVE<=1    GENTAMICIN Value in next row Sensitive      SENSITIVE<=1    IMIPENEM Value in next row Sensitive      SENSITIVE<=1    NITROFURANTOIN Value in next row Resistant      SENSITIVE<=1    TRIMETH/SULFA Value in next row Sensitive      SENSITIVE<=1    PIP/TAZO Value in next row Sensitive      SENSITIVE<=4    * RARE GROWTH PROTEUS PENNERI   Enterococcus faecalis - MIC*    AMPICILLIN Value in next row Sensitive      SENSITIVE<=4    LINEZOLID Value in next row Sensitive      SENSITIVE<=4    * RARE GROWTH ENTEROCOCCUS FAECALIS  Wound culture     Status: None   Collection Time: 06/13/15  1:58 PM  Result Value Ref Range Status   Specimen Description LEG  Final   Special Requests Normal  Final   Gram Stain RARE WBC SEEN FEW GRAM NEGATIVE RODS   Final   Culture NO GROWTH 4 DAYS  Final   Report Status 06/17/2015 FINAL  Final  MRSA PCR Screening     Status: None   Collection Time: 07/01/15  7:40 PM  Result Value Ref Range Status   MRSA by PCR NEGATIVE NEGATIVE Final    Comment:        The GeneXpert MRSA Assay (FDA approved for NASAL specimens only), is one component of a comprehensive MRSA colonization surveillance program. It  is not intended to diagnose MRSA infection nor to guide or monitor treatment for MRSA infections.   Cath Tip Culture     Status: None   Collection Time: 07/01/15  9:14 PM  Result Value Ref Range Status   Specimen Description CATH TIP  Final   Special Requests NONE  Final   Culture NO GROWTH 3 DAYS  Final   Report Status 07/04/2015 FINAL  Final    Coagulation Studies: No results for input(s): LABPROT, INR in the last 72 hours.  Urinalysis: No results for input(s): COLORURINE, LABSPEC, PHURINE, GLUCOSEU, HGBUR, BILIRUBINUR, KETONESUR, PROTEINUR, UROBILINOGEN, NITRITE, LEUKOCYTESUR in the last 72 hours.  Invalid input(s): APPERANCEUR    Imaging: No results found.   Medications:   . sodium chloride 20 mL/hr at 07/05/15 0046   . feeding supplement  1 Container Oral TID WC  . furosemide  40 mg Oral Daily  . hydrALAZINE  25 mg Oral 3 times per day  . isosorbide mononitrate  30 mg Oral Daily  .  lactulose  20 g Oral BID  . magnesium oxide  400 mg Oral Daily  . metoprolol tartrate  50 mg Oral Q8H  . potassium chloride  40 mEq Oral Daily   alteplase, ammonium lactate, anticoagulant sodium citrate, ipratropium-albuterol, lidocaine (PF), lidocaine-prilocaine, ondansetron (ZOFRAN) IV, pentafluoroprop-tetrafluoroeth, senna-docusate  Assessment/ Plan:  64 y.o. male with a PMHx of congestive heart failure ejection fraction 30-35%, prior pneumonia, lower extremity edema with cellulitis, atrial fibrillation who was [facility] to [facility] on 06/10/2015 for evaluation of altered mental status and shortness of breath. Upon admission the patient underwent intubation with transition to a mechanical ventilation.  1. Acute renal failure/chronic kidney disease stage III with baseline creatinine 1.3 with proteinuria. The patient most likely has acute renal failure secondary to altered cardiorenal hemodynamics. His ejection fraction is low at 30%. Versus post strep GN.  - hemodialysis last on 9/2, UF of  2 litres. Will look into taking out permcath, discussed with vascular surgery - Agree with furosemide - Nonoliguric urine output.  - discontinue foley catheter.  - pending labs today.   2. Hypokalemia: due to post ATN diuresis and furosemide - potassium PO.   3. Acute respiratory failure. From underlying heart failure/pulmonary edema. Had period of intubation. Continue to monitor respiratory status closely.  Continue furosemide  4.generalized edema from  Acute systolic heart failure. EF 30-35%. - furosemide  5. Hypertension: on metoprolol, hydralazine, and isosorbide mononitrate.    LOS: [place], [place] 9/8/20169:58 AM

## 2015-07-05 NOTE — Progress Notes (Signed)
[place] at [place] NAME: Domonik Levario    MR#:  [id number]  DATE OF BIRTH:  Dec 23, 1950  SUBJECTIVE:  Doing well. Breathing better.just got back from HD cath removal  REVIEW OF SYSTEMS:   Review of Systems  Constitutional: Negative for fever, chills and weight loss.  HENT: Negative for ear discharge, ear pain and nosebleeds.   Eyes: Negative for blurred vision, pain and discharge.  Respiratory: Negative for sputum production, shortness of breath, wheezing and stridor.   Cardiovascular: Negative for chest pain, palpitations, orthopnea and PND.  Gastrointestinal: Negative for nausea, vomiting, abdominal pain and diarrhea.  Genitourinary: Negative for urgency and frequency.  Musculoskeletal: Negative for back pain and joint pain.  Neurological: Positive for weakness. Negative for sensory change, speech change and focal weakness.  Psychiatric/Behavioral: Negative for depression. The patient is not nervous/anxious.   All other systems reviewed and are negative.  Tolerating Diet:yes Tolerating PT: rec Rehab  DRUG ALLERGIES:   Allergies  Allergen Reactions  . Penicillins Nausea And Vomiting    VITALS:  Blood pressure 152/86, pulse 72, temperature 98.5 F (36.9 C), temperature source Oral, resp. rate 18, height 5\' 8"  (1.727 m), weight 97.251 kg (214 lb 6.4 oz), SpO2 99 %.  PHYSICAL EXAMINATION:   Physical Exam  GENERAL:  64 y.o.-year-old patient lying in the bed with no acute distress. obses EYES: Pupils equal, round, reactive to light and accommodation. No scleral icterus. Extraocular muscles intact.  HEENT: Head atraumatic, normocephalic. Oropharynx and nasopharynx clear.  NECK:  Supple, no jugular venous distention. No thyroid enlargement, no tenderness.  LUNGS: Normal breath sounds bilaterally, no wheezing, rales, rhonchi. No use of accessory muscles of respiration. HD cath + left upper chest CARDIOVASCULAR: S1, S2 normal. No  murmurs, rubs, or gallops.  ABDOMEN: Soft, nontender, nondistended. Bowel sounds present. No organomegaly or mass.  EXTREMITIES: No cyanosis, clubbing or edema b/l.    NEUROLOGIC: Cranial nerves II through XII are intact. No focal Motor or sensory deficits b/l.  Overall weak PSYCHIATRIC: patient is alert and oriented x 2.  SKIN: No obvious rash, lesion, or ulcer.   LABORATORY PANEL:   CBC  Recent Labs Lab 07/04/15 0709  WBC 3.8  HGB 9.8*  HCT 28.9*  PLT 146*    Chemistries   Recent Labs Lab 07/01/15 2114  07/04/15 0709  07/05/15 1014  NA 140  < > 139  < > 137  K 3.3*  < > 2.8*  < > 3.2*  CL 99*  < > 91*  < > 90*  CO2 31  < > 37*  < > 32  GLUCOSE 101*  < > 87  < > 87  BUN 42*  < > 44*  < > 43*  CREATININE 3.74*  < > 3.61*  < > 3.41*  CALCIUM 8.3*  < > 7.8*  < > 8.1*  MG  --   < > 1.4*  --   --   AST 22  --   --   --   --   ALT 25  --   --   --   --   ALKPHOS 75  --   --   --   --   BILITOT 2.5*  --   --   --   --   < > = values in this interval not displayed.  ASSESSMENT AND PLAN:   1. Acute respiratory failure with hypoxia- secondary to fluid overload. clinically improved.  Diuresing well with po lasix. Patient placed back on oxygen, we will slowly wean it off. 2. Acute on chronic systolic congestive heart failure- rales at the bases.  chest x-ray consistent with heart failure. current medical regimen is effective;  continue present plan and medications. Lasix po daily, replete K  Patient is on metoprolol. No ACE inhibitor at this point while trying to figure out whether his kidney function is going to get better or not. Added imdur. 3. Acute renal failure on chronic kidney disease stage III- could be ATN or poststreptococcal. per nephrology no need for HD. HD catheter to be removed on sept 8th 4. Essential hypertension continue metoprolol, imdur 5. Clinical sepsis, pneumonia, bilateral lower extending cellulitis- patient finished antibiotics course here in the  hospital. MSSA and enterococcus pneumonia. 6. Rapid atrial fibrillation on presentation - rate controlled at this time. Continue metoprolol for rate control, aspirin 7. Hyperkalemia on presentation- improved with dialysis, now with hypokalemia. Will supplement potassium as needed basis.  8. Generalized weakness-consult physical therapy noted. Recommends STR. Pt has not payor source. CSW working on it  Case discussed with Care Management/Social Worker. Management plans discussed with the patient and  in agreement.  CODE STATUS: full  DVT Prophylaxis: heparin  TOTAL TIME TAKING CARE OF THIS PATIENT: 30 minutes.  >50% time spent on counselling and coordination of care pt,CSW,RN  POSSIBLE D/C IN next several DAYS, DEPENDING ON CLINICAL CONDITION.   Marcellas Marchant M.D on 07/05/2015 at 1:52 PM  Between 7am to 6pm - Pager - [phone]  After 6pm go to [email or web address] - password [email or web address] [facility]  [facility]  Office  [phone]  CC: Primary care physician; No PCP Per Patient

## 2015-07-05 NOTE — Progress Notes (Signed)
Physical Therapy Treatment Patient Details Name: Aaron Harrison MRN: [id number] DOB: 01/23/1951 Today's Date: 07/05/2015    History of Present Illness presented to ER secondary to weakness, AMS; admitted (and subsequently intubated) for acute hypercarbic/hypoxic respiratory failure secondary to CHF exacerbation.  Hospital course also significant for liver dysfunction, acute encephalopathy and renal failure (requiring CRRT, ended 8/25; now with R temp fem cath); successfully extubated 8/25, currently on 1L O2 via [facility].  Patient noted with transfer to CCU over weekend due to SOB/respiratory distress, now improved and returned to floor; new order received to resume PT services this date.    PT Comments    Patient with marked improvement in functional ability this date, requiring no greater than min assist for any bed mobility, transfers or gait.  Continues to fatigue quickly, but demonstrates good motivation/participation with all therex. LE strength/power remains globally deconditioned, indicated by increased time required for 5x sit/stand.  Recommend continued use of RW and +1 with all mobility to maximize safety/minimze fall risk.   Follow Up Recommendations  SNF     Equipment Recommendations       Recommendations for Other Services       Precautions / Restrictions Precautions Precautions: Fall Restrictions Weight Bearing Restrictions: No    Mobility  Bed Mobility Overal bed mobility: Modified Independent Bed Mobility: Supine to Sit;Sit to Supine     Supine to sit: Modified independent (Device/Increase time) Sit to supine: Modified independent (Device/Increase time)   General bed mobility comments: increased time and use of bedrail required to complete  Transfers Overall transfer level: Needs assistance Equipment used: Rolling walker (2 wheeled) Transfers: Sit to/from Stand Sit to Stand: Min assist         General transfer comment: increase sway in A/P plane, heavy use  of bilat UEs to correct  Ambulation/Gait Ambulation/Gait assistance: Min assist Ambulation Distance (Feet): 50 Feet Assistive device: Rolling walker (2 wheeled)       General Gait Details: narrowed BOS with mild forward trunk flexion; limited heel strike/toe off.  Increased sway in A/P plane; decreased ability to respond to external perturbation.  Additional distance limited by need for toileting.   Stairs            Wheelchair Mobility    Modified Rankin (Stroke Patients Only)       Balance Overall balance assessment: Needs assistance Sitting-balance support: No upper extremity supported;Feet supported       Standing balance support: Bilateral upper extremity supported Standing balance-Leahy Scale: Fair                      Cognition Arousal/Alertness: Awake/alert Behavior During Therapy: WFL for tasks assessed/performed                        Exercises Other Exercises Other Exercises: 5x sit/stand with RW, min assist: 36 seconds.  Indicative of decreased LE strength/power, increased fall risk. Heavy reliance on bilat UEs for lift off and balance recovery. Other Exercises: Standing LE therex with RW, cga/min assist: alternate LE target tapping/weight shifting, dynamic UE reaching and horiz abduct/adduct.  All designed to promote dynamic balance reactions, min assist to complete.      General Comments        Pertinent Vitals/Pain Pain Assessment: No/denies pain    Home Living                      Prior Function  PT Goals (current goals can now be found in the care plan section) Acute Rehab PT Goals Patient Stated Goal: to get stronger PT Goal Formulation: With patient Time For Goal Achievement: 07/07/15 Potential to Achieve Goals: Fair Progress towards PT goals: Progressing toward goals    Frequency  Min 2X/week    PT Plan Current plan remains appropriate    Co-evaluation             End of Session  Equipment Utilized During Treatment: Gait belt Activity Tolerance: Patient tolerated treatment well Patient left: in bed;with call bell/phone within reach;with bed alarm set     Time: 0762-2633 PT Time Calculation (min) (ACUTE ONLY): 24 min  Charges:  $Gait Training: 8-22 mins $Therapeutic Exercise: 8-22 mins                    G Codes:      Adoni Greenough H. Owens Shark, PT, DPT, NCS 07/05/2015, 3:51 PM [phone]

## 2015-07-05 NOTE — Progress Notes (Signed)
Spoke with [name] please reinstate previous diet order

## 2015-07-06 ENCOUNTER — Encounter: Payer: Self-pay | Admitting: Vascular Surgery

## 2015-07-06 DIAGNOSIS — G3184 Mild cognitive impairment, so stated: Secondary | ICD-10-CM

## 2015-07-06 LAB — RENAL FUNCTION PANEL
ALBUMIN: 3.1 g/dL — AB (ref 3.5–5.0)
ANION GAP: 11 (ref 5–15)
ANION GAP: 14 (ref 5–15)
Albumin: 3.1 g/dL — ABNORMAL LOW (ref 3.5–5.0)
BUN: 44 mg/dL — ABNORMAL HIGH (ref 6–20)
BUN: 53 mg/dL — ABNORMAL HIGH (ref 6–20)
CALCIUM: 8.1 mg/dL — AB (ref 8.9–10.3)
CHLORIDE: 91 mmol/L — AB (ref 101–111)
CO2: 37 mmol/L — ABNORMAL HIGH (ref 22–32)
CO2: 31 mmol/L (ref 22–32)
CREATININE: 3.61 mg/dL — AB (ref 0.61–1.24)
Calcium: 7.8 mg/dL — ABNORMAL LOW (ref 8.9–10.3)
Chloride: 91 mmol/L — ABNORMAL LOW (ref 101–111)
Creatinine, Ser: 3.68 mg/dL — ABNORMAL HIGH (ref 0.61–1.24)
GFR calc Af Amer: 19 mL/min — ABNORMAL LOW (ref >60)
GFR, EST AFRICAN AMERICAN: 19 mL/min — AB (ref >60)
GFR, EST NON AFRICAN AMERICAN: 17 mL/min — AB (ref >60)
GFR, EST NON AFRICAN AMERICAN: 16 mL/min — AB (ref >60)
Glucose, Bld: 87 mg/dL (ref 65–99)
Glucose, Bld: 91 mg/dL (ref 65–99)
PHOSPHORUS: 3.9 mg/dL (ref 2.5–4.6)
POTASSIUM: 3.5 mmol/L (ref 3.5–5.1)
Phosphorus: 3.6 mg/dL (ref 2.5–4.6)
Potassium: 2.8 mmol/L — CL (ref 3.5–5.1)
SODIUM: 136 mmol/L (ref 135–145)
Sodium: 139 mmol/L (ref 135–145)

## 2015-07-06 NOTE — Progress Notes (Signed)
Nutrition Follow-up    INTERVENTION:  Meals and snacks: cater to pt preferences Nutrition Supplement Therapy: Continue boost breeze at this time for added nutrition   NUTRITION DIAGNOSIS:   Inadequate oral intake related to acute illness as evidenced by NPO status, improved with eating po diet and drinking supplement    GOAL:   Patient will meet greater than or equal to 90% of their needs    MONITOR:    (Energy Intake, Anthropometrics, Digestive System, Electrolyte/Renal Profile)  REASON FOR ASSESSMENT:   Consult Enteral/tube feeding initiation and management  ASSESSMENT:     Noted HD cath removed. Awaiting placement   Current Nutrition: ate muffin, boost breeze, coffee this am Noted per I and O sheet intake 40-100% of meals   Gastrointestinal Profile: Last BM: 9/08   Medications: reviewed  Electrolyte/Renal Profile and Glucose Profile:   Recent Labs Lab 07/02/15 0801 07/03/15 1027 07/04/15 0709 07/04/15 0813 07/05/15 1014 07/06/15 0455  NA 139 130*  134* 139 138 137 136  K 3.1* 2.4*  2.5* 2.8* 2.9* 3.2* 3.5  CL 98* 86*  90* 91* 91* 90* 91*  CO2 30 27  33* 37* 35* 32 31  BUN 41* 46*  46* 44* 43* 43* 53*  CREATININE 3.97* 3.91*  3.96* 3.61* 3.44* 3.41* 3.68*  CALCIUM 8.1* 7.2*  7.7* 7.8* 8.0* 8.1* 8.1*  MG 1.7 1.4* 1.4*  --   --   --   PHOS 5.4* 3.8 3.6 3.6 3.7 3.9  GLUCOSE 88 125*  132* 87 81 87 91   Protein Profile:  Recent Labs Lab 07/04/15 0813 07/05/15 1014 07/06/15 0455  ALBUMIN 3.2* 3.3* 3.1*      Weight Trend since Admission: Filed Weights   07/04/15 0500 07/05/15 0300 07/06/15 0500  Weight: 226 lb 10.1 oz (102.8 kg) 214 lb 6.4 oz (97.251 kg) 213 lb 3.2 oz (96.707 kg)       Diet Order:  Diet Heart Room service appropriate?: Yes; Fluid consistency:: Thin  Skin:  Reviewed, no issues   Height:   Ht Readings from Last 1 Encounters:  06/13/15 5\' 8"  (1.727 m)    Weight:   Wt Readings from Last 1 Encounters:   07/06/15 213 lb 3.2 oz (96.707 kg)     BMI:  Body mass index is 32.42 kg/(m^2).  Estimated Nutritional Needs:   Kcal:  1750-2100   Protein:  84-105 g (1.2-1.5 g/kg)   Fluid:  1000 mL plus UOP  EDUCATION NEEDS:   No education needs identified at this time  LOW Care Level  Eather Chaires B. Zenia Resides, [place], [place] (pager)

## 2015-07-06 NOTE — Clinical Social Work Note (Signed)
Harriet with [facility] returned CSW call and stated that she would be more than happy to review patient's referral and to fax at : [phone]. Reino Bellis did express concern about patient's discharge if they were to accept him, from their facility and that he currently has no water and may not have electricity at the time. CSW has faxed the referral to [facility] for review.   In addition, psychiatry has come by to assess patient and verbally told CSW that patient has the capacity to make the decision regarding if he wishes to go or not to rehab post discharge.   Shela Leff MSW,LCSW [phone]

## 2015-07-06 NOTE — Progress Notes (Signed)
Patient was made a followup appointment at the outpatient [facility] on July 19, 2015 at 10:00am. Thank you.

## 2015-07-06 NOTE — Progress Notes (Signed)
Subjective:   UOP 375 but states he has not being keeping urinals.  Creatinine and BUN slightly elevated this morning.   Objective:  Vital signs in last 24 hours:  Temp:  [97.5 F (36.4 C)-98.6 F (37 C)] 97.5 F (36.4 C) (09/09 0758) Pulse Rate:  [71-141] 76 (09/09 1000) Resp:  [18-20] 20 (09/08 2316) BP: (107-152)/(53-86) 123/57 mmHg (09/09 1000) SpO2:  [96 %-100 %] 96 % (09/09 1000) Weight:  [96.707 kg (213 lb 3.2 oz)] 96.707 kg (213 lb 3.2 oz) (09/09 0500)  Weight change: -0.544 kg (-1 lb 3.2 oz) Filed Weights   07/04/15 0500 07/05/15 0300 07/06/15 0500  Weight: 102.8 kg (226 lb 10.1 oz) 97.251 kg (214 lb 6.4 oz) 96.707 kg (213 lb 3.2 oz)    Intake/Output: I/O last 3 completed shifts: In: 1243.9 [P.O.:580; I.V.:663.9] Out: 1015 [Urine:1015]   Intake/Output this shift:     Physical Exam: General: NAD  Head: Normocephalic, atraumatic.  ENT Moist mucus membranes hearing intact  Neck: Supple, trachea midline  Lungs:  clear  Heart: Irregular S1S2 no rubs  Abdomen:  nontender, mild distension, BS+   Extremities:  trace generalized edema  Neurologic:  resting comfortably   Skin: ulcertaion bilateral lower extremities on shins - in UNNA boots  Access: RIJ permcath 8/31 Dr. Lucky Cowboy    Basic Metabolic Panel:  Recent Labs Lab 06/30/15 0500 07/01/15 0539  07/02/15 0801 07/03/15 1027 07/04/15 0709 07/04/15 0813 07/05/15 1014 07/06/15 0455  NA 138 138  < > 139 130*  134* 139 138 137 136  K 3.0* 3.1*  < > 3.1* 2.4*  2.5* 2.8* 2.9* 3.2* 3.5  CL 102 99*  < > 98* 86*  90* 91* 91* 90* 91*  CO2 29 29  < > 30 27  33* 37* 35* 32 31  GLUCOSE 93 112*  < > 88 125*  132* 87 81 87 91  BUN 36* 38*  < > 41* 46*  46* 44* 43* 43* 53*  CREATININE 3.50* 3.79*  < > 3.97* 3.91*  3.96* 3.61* 3.44* 3.41* 3.68*  CALCIUM 7.8* 8.2*  < > 8.1* 7.2*  7.7* 7.8* 8.0* 8.1* 8.1*  MG 1.8 1.9  --  1.7 1.4* 1.4*  --   --   --   PHOS 3.7 4.3  --  5.4* 3.8 3.6 3.6 3.7 3.9  < > = values in  this interval not displayed.  Liver Function Tests:  Recent Labs Lab 07/01/15 2114  07/03/15 1027 07/04/15 0709 07/04/15 0813 07/05/15 1014 07/06/15 0455  AST 22  --   --   --   --   --   --   ALT 25  --   --   --   --   --   --   ALKPHOS 75  --   --   --   --   --   --   BILITOT 2.5*  --   --   --   --   --   --   PROT 6.4*  --   --   --   --   --   --   ALBUMIN 3.0*  < > 3.0* 3.1* 3.2* 3.3* 3.1*  < > = values in this interval not displayed. No results for input(s): LIPASE, AMYLASE in the last 168 hours. No results for input(s): AMMONIA in the last 168 hours.  CBC:  Recent Labs Lab 07/01/15 2114 07/03/15 1027 07/04/15 0709  WBC 5.6 4.9 3.8  NEUTROABS 4.4  --   --   HGB 8.6* 9.8* 9.8*  HCT 25.7* 28.9* 28.9*  MCV 101.7* 100.6* 99.9  PLT 99* 133* 146*    Cardiac Enzymes: No results for input(s): CKTOTAL, CKMB, CKMBINDEX, TROPONINI in the last 168 hours.  BNP: Invalid input(s): POCBNP  CBG:  Recent Labs Lab 07/01/15 2002  GLUCAP 126*    Microbiology: Results for orders placed or performed during the hospital encounter of 06/10/15  Blood Culture (routine x 2)     Status: None   Collection Time: 06/10/15  2:35 PM  Result Value Ref Range Status   Specimen Description BLOOD RIGHT WRIST  Final   Special Requests BOTTLES DRAWN AEROBIC AND ANAEROBIC  [place]  Final   Culture NO GROWTH 5 DAYS  Final   Report Status 06/15/2015 FINAL  Final  Blood Culture (routine x 2)     Status: None   Collection Time: 06/10/15  2:40 PM  Result Value Ref Range Status   Specimen Description BLOOD LEFT ASSIST CONTROL  Final   Special Requests   Final    BOTTLES DRAWN AEROBIC AND ANAEROBIC  AER 6CC ANA 4CC   Culture NO GROWTH 5 DAYS  Final   Report Status 06/15/2015 FINAL  Final  Urine culture     Status: None   Collection Time: 06/10/15  4:42 PM  Result Value Ref Range Status   Specimen Description URINE, RANDOM  Final   Special Requests NONE  Final   Culture 4,000 COLONIES/mL  INSIGNIFICANT GROWTH  Final   Report Status 06/12/2015 FINAL  Final  MRSA PCR Screening     Status: None   Collection Time: 06/10/15  6:27 PM  Result Value Ref Range Status   MRSA by PCR NEGATIVE NEGATIVE Final    Comment:        The GeneXpert MRSA Assay (FDA approved for NASAL specimens only), is one component of a comprehensive MRSA colonization surveillance program. It is not intended to diagnose MRSA infection nor to guide or monitor treatment for MRSA infections.   Culture, sputum-assessment     Status: None   Collection Time: 06/11/15  2:50 PM  Result Value Ref Range Status   Specimen Description SPUTUM  Final   Special Requests NONE  Final   Sputum evaluation THIS SPECIMEN IS ACCEPTABLE FOR SPUTUM CULTURE  Final   Report Status 06/13/2015 FINAL  Final  Culture, respiratory (NON-Expectorated)     Status: None   Collection Time: 06/11/15  2:50 PM  Result Value Ref Range Status   Specimen Description SPUTUM  Final   Special Requests NONE Reflexed from [id number]  Final   Gram Stain   Final    MODERATE WBC SEEN RARE GRAM POSITIVE COCCI GOOD SPECIMEN - 80-90% WBCS    Culture MODERATE GROWTH STAPHYLOCOCCUS AUREUS  Final   Report Status 06/14/2015 FINAL  Final   Organism ID, Bacteria STAPHYLOCOCCUS AUREUS  Final      Susceptibility   Staphylococcus aureus - MIC*    CIPROFLOXACIN <=0.5 SENSITIVE Sensitive     GENTAMICIN <=0.5 SENSITIVE Sensitive     OXACILLIN 0.5 SENSITIVE Sensitive     TRIMETH/SULFA <=10 SENSITIVE Sensitive     CEFOXITIN SCREEN NEGATIVE Sensitive     Inducible Clindamycin NEGATIVE Sensitive     TETRACYCLINE Value in next row Sensitive      SENSITIVE<=1    * MODERATE GROWTH STAPHYLOCOCCUS AUREUS  Wound culture     Status: None   Collection Time: 06/13/15  1:56  PM  Result Value Ref Range Status   Specimen Description LEG  Final   Special Requests Normal  Final   Gram Stain   Final    FEW WBC SEEN MODERATE GRAM NEGATIVE RODS FEW GRAM POSITIVE COCCI     Culture   Final    LIGHT GROWTH STAPHYLOCOCCUS AUREUS RARE GROWTH PROTEUS PENNERI RARE GROWTH ENTEROCOCCUS FAECALIS    Report Status 06/17/2015 FINAL  Final   Organism ID, Bacteria STAPHYLOCOCCUS AUREUS  Final   Organism ID, Bacteria PROTEUS PENNERI  Final   Organism ID, Bacteria ENTEROCOCCUS FAECALIS  Final      Susceptibility   Staphylococcus aureus - MIC*    CIPROFLOXACIN <=0.5 SENSITIVE Sensitive     GENTAMICIN <=0.5 SENSITIVE Sensitive     OXACILLIN 0.5 SENSITIVE Sensitive     TRIMETH/SULFA <=10 SENSITIVE Sensitive     CEFOXITIN SCREEN NEGATIVE Sensitive     Inducible Clindamycin NEGATIVE Sensitive     TETRACYCLINE Value in next row Sensitive      SENSITIVE<=1    * LIGHT GROWTH STAPHYLOCOCCUS AUREUS   Proteus penneri - MIC*    AMPICILLIN Value in next row Resistant      SENSITIVE<=1    CEFAZOLIN Value in next row Resistant      SENSITIVE<=1    CEFTRIAXONE Value in next row Sensitive      SENSITIVE<=1    CIPROFLOXACIN Value in next row Sensitive      SENSITIVE<=1    GENTAMICIN Value in next row Sensitive      SENSITIVE<=1    IMIPENEM Value in next row Sensitive      SENSITIVE<=1    NITROFURANTOIN Value in next row Resistant      SENSITIVE<=1    TRIMETH/SULFA Value in next row Sensitive      SENSITIVE<=1    PIP/TAZO Value in next row Sensitive      SENSITIVE<=4    * RARE GROWTH PROTEUS PENNERI   Enterococcus faecalis - MIC*    AMPICILLIN Value in next row Sensitive      SENSITIVE<=4    LINEZOLID Value in next row Sensitive      SENSITIVE<=4    * RARE GROWTH ENTEROCOCCUS FAECALIS  Wound culture     Status: None   Collection Time: 06/13/15  1:58 PM  Result Value Ref Range Status   Specimen Description LEG  Final   Special Requests Normal  Final   Gram Stain RARE WBC SEEN FEW GRAM NEGATIVE RODS   Final   Culture NO GROWTH 4 DAYS  Final   Report Status 06/17/2015 FINAL  Final  MRSA PCR Screening     Status: None   Collection Time: 07/01/15  7:40 PM  Result  Value Ref Range Status   MRSA by PCR NEGATIVE NEGATIVE Final    Comment:        The GeneXpert MRSA Assay (FDA approved for NASAL specimens only), is one component of a comprehensive MRSA colonization surveillance program. It is not intended to diagnose MRSA infection nor to guide or monitor treatment for MRSA infections.   Cath Tip Culture     Status: None   Collection Time: 07/01/15  9:14 PM  Result Value Ref Range Status   Specimen Description CATH TIP  Final   Special Requests NONE  Final   Culture NO GROWTH 3 DAYS  Final   Report Status 07/04/2015 FINAL  Final    Coagulation Studies: No results for input(s): LABPROT, INR in the last 72 hours.  Urinalysis: No results for input(s): COLORURINE, LABSPEC, PHURINE, GLUCOSEU, HGBUR, BILIRUBINUR, KETONESUR, PROTEINUR, UROBILINOGEN, NITRITE, LEUKOCYTESUR in the last 72 hours.  Invalid input(s): APPERANCEUR    Imaging: No results found.   Medications:     . feeding supplement  1 Container Oral TID WC  . furosemide  40 mg Oral Daily  . heparin subcutaneous  5,000 Units Subcutaneous 3 times per day  . hydrALAZINE  25 mg Oral 3 times per day  . isosorbide mononitrate  30 mg Oral Daily  . lactulose  20 g Oral BID  . magnesium oxide  400 mg Oral Daily  . metoprolol tartrate  50 mg Oral Q8H  . potassium chloride  40 mEq Oral Daily   alteplase, ammonium lactate, anticoagulant sodium citrate, ipratropium-albuterol, lidocaine (PF), lidocaine-prilocaine, ondansetron (ZOFRAN) IV, pentafluoroprop-tetrafluoroeth, senna-docusate  Assessment/ Plan:  64 y.o. male with a PMHx of congestive heart failure ejection fraction 30-35%, prior pneumonia, lower extremity edema with cellulitis, atrial fibrillation who was admitted to [facility] on 06/10/2015 for evaluation of altered mental status and shortness of breath. Upon admission the patient underwent intubation with transition to a mechanical ventilation.  1. Acute renal failure/chronic kidney  disease stage III with baseline creatinine 1.3 with proteinuria. The patient most likely has acute renal failure secondary to altered cardiorenal hemodynamics. His ejection fraction is low at 30%. Versus post strep GN.  - hemodialysis last on 9/2, UF of 2 litres. Permcath removed on 9/8. - Agree with furosemide - Nonoliguric urine output. Will need stict I&Os.   2. Hypokalemia: due to post ATN diuresis and furosemide - potassium PO.   3. Acute respiratory failure. From underlying heart failure/pulmonary edema. Had period of intubation. Continue to monitor respiratory status closely.  Continue furosemide  4.generalized edema from  Acute systolic heart failure. EF 30-35%. - furosemide  5. Hypertension: on metoprolol, hydralazine, and isosorbide mononitrate.    LOS: 26 Mirko Tailor 9/9/201610:30 AM

## 2015-07-06 NOTE — Clinical Social Work Note (Signed)
CSW met with patient at length today. CSW discussed rehab recommendations by PT and that we had been looking for a facility for him to go to that would accept him on an LOG (CSW explained what this was to patient)and no insurance. CSW explained that there were no facilities at this time willing to accept him. I explained that there was a facility in [place]: [facility] that might accept him due to a partnership with [employer]. Patient initially stated he would not consider this as it was too far away. He stated he would return home. CSW then began discussion regarding his home situation and the concern about this. Patient appeared puzzled at the concern. CSW mentioned that there was concern about his living conditions from EMS when they arrived. CSW mentioned that there was also concern that his electricity and water might be turned off. Patient appeared naturally concerned about this and stated his electricity should not be turned off because he went to the church and dropped of the bill and they paid it. Patient stated that he has money to get food. He stated that his friend, [name], has been going by daily to get his mail and feed his cats. Patient stated he was worried now about his electricity and asked if I would call to see about it and his water. Patient gave CSW permission to contact [name] and so CSW did and [name] stated that he has concern about him returning home at discharge. [name] stated that patient appears to be oriented and then at times will say things like, "...when I was at your house last Thursday night." Knowing that he was in the hospital at that time. [name] stated patient never made it to the church to drop off the bills and thus his bills were not paid. He also stated that he was not the one that was going by the patient's home, it was a Ezequiel Kayser. [name] stated he would help where he could but that his ability was limited.  As asked per patient,  CSW contacted [facility] and they stated he owed $595.40 and had to pay 374.61 by 9/30 in order to avoid disconnect. CSW contacted [employer] and they confirmed patient's water was disconnected and that he would owe: 249.09 to get it caught up and reconnected.   CSW followed up with patient about this and provided him the information in written form so that he could look at it and remember it in case he forgot. Patient was surprised at the information. CSW offered to allow patient to have time to process the information and to decide about the [facility] facility. Patient stated that he did not want to go to [facility] still, but he would allow me to make the referral.   Due to patient's fragile memory recollection and the illness he has been through, CSW recommended psychiatry see patient in regards to decision making as it pertains to his discharge plan. MD placed psych consult.   CSW has left message for Reino Bellis, the Admission's Coordinator at [facility] [place]: [phone].

## 2015-07-06 NOTE — Consult Note (Signed)
[facility] Consult   Reason for Consult:  This is a consult for this 64 year old man currently in the hospital with pneumonia cellulitis and multiple other medical problems. There was concern about his capacity Referring Physician:  Posey Pronto Patient Identification: Aaron Harrison MRN:  [id number] Principal Diagnosis: Mild cognitive impairment Diagnosis:   Patient Active Problem List   Diagnosis Date Noted  . Mild cognitive impairment [G31.84] 07/06/2015  . Blood poisoning [A41.9]   . NSVT (nonsustained ventricular tachycardia) [I47.2]   . Abdominal distention [R14.0]   . Cellulitis of left lower extremity [L03.116]   . Endotracheally intubated [Z78.9]   . Healthcare-associated pneumonia [J18.9]   . Liver disease [K76.9]   . Acute respiratory failure with hypoxemia [J96.01]   . 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 Acute respiratory failure [J96.00] 06/10/2015  . Community acquired pneumonia [J18.9] 06/10/2015  . Acute renal failure [N17.9] 06/10/2015  . Acute CHF [I50.9] 06/10/2015  . Hyperkalemia [E87.5] 06/10/2015  . Ascites [R18.8] 06/10/2015  . Bilateral lower leg cellulitis [L03.116, L03.115] 06/10/2015  . Atrial fibrillation [I48.91] 06/10/2015  . Sepsis [A41.9] 06/10/2015  . Coagulopathy [D68.9] 06/10/2015  . Chronic systolic heart failure [T73.22] 05/11/2015  . Essential hypertension [I10] 05/11/2015  . Bilateral edema of lower extremity [R60.0]   . Cellulitis of lower extremity [L03.119] 04/07/2015  . Atrial fibrillation, chronic [I48.2] 04/07/2015  . Stasis ulcer of lower extremity [I83.009] 04/07/2015    Total Time spent with patient: 1 hour  Subjective:   Aaron Harrison is a 64 y.o. male patient admitted with "I guess I had pneumonia and cellulitis".  HPI:  Information from the patient and the chart. Also discussed the case with the case manager on the unit. This 64 year old man has been in the hospital for several days now with  multiple medical problems. He is still recovering from a pneumonia and cellulitis. He is having difficulty walking independently. He needs a walker and some assistance just walk around the unit he is being recommended to go to rehabilitation when he is discharged. There is concern about whether he will agree. Evidently in earlier conversations the patient was hesitant to agree particularly if the rehabilitation was not going to be located here in town. On interview today the patient has no mental health complaints. Denies feeling depressed. Denies significant anxiety problems. Denies any suicidal or homicidal ideation. Denies having any hallucinations. He says that he at times recognizes a little bit of memory impairment but doesn't feel like it's been major. Discussing his disposition he is able to tell me that he knows that he has multiple serious medical problems and he acknowledges that this puts him at greater risk of falls or other dangerous situations at home. He acknowledges that it would be safer for him to go to rehabilitation to recover before going home. One concern from case management is that apparently they have documented that his utilities including his water have already been turned off. Patient does not deny this but says that he should be able to get some money by the end of the weekend to have his water turned back on. He tells me that he would really prefer to be in his own environment but is considering the safety of it.  Patient denies any psychiatric history. Never seen a psychiatrist or therapist before. Never been in a psychiatric hospital. No history of suicide attempts or violence.  Social history: Patient lives alone in a house. He is a little behind  on his mortgage already. 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 Financially he is struggling. Does say that he has some friends that are of some assistance  to him but admits that it's not a great deal. He does have a vehicle and can drive.  Substance abuse history: Denies that he drinks alcohol or abuses drugs or has had any kind of alcohol or drug abuse history in the past.  Medical history: Multiple medical problems including congestive heart failure, hyperkalemia, pneumonia, cellulitis. Doesn't seem to of been taking care of himself particularly well.  Medications: Long list including medicines for blood pressure and some antibiotics. He is aware that he needs to stay on them.  Laboratory results: No evidence here of alcohol or drug abuse. To examination doing a Mini-Mental status exam test the patient scored 26 out of 30. His biggest deficits were that he could only remember one object in 3 minutes and that he did a poor job in figure drawing. HPI Elements:   Quality:  Some confusion and disagreement with the staff. A little bit of memory loss. Severity:  Mild to moderate. Timing:  Seems to be a chronic issue. Duration:  Ongoing. Context:  Acute illness as well as major financial stress.  Past Medical History:  Past Medical History  Diagnosis Date  . Chronic atrial fibrillation     a. not on long term anticoagulation  . Pneumonia 2011  . Chronic systolic CHF (congestive heart failure)   . Cellulitis     Past Surgical History  Procedure Laterality Date  . Peripheral vascular catheterization N/A 06/27/2015    Procedure: Dialysis/Perma Catheter Insertion;  Surgeon: Algernon Huxley, MD;  Location: [facility];  Service: Cardiovascular;  Laterality: N/A;  . Peripheral vascular catheterization Right 07/05/2015    Procedure: Dialysis/Perma Catheter Removal;  Surgeon: Algernon Huxley, MD;  Location: [facility];  Service: Cardiovascular;  Laterality: Right;   Family History:  Family History  Problem Relation Age of Onset  . Diabetes Mother   . Stroke Mother   . Heart failure Father    Social History:  History  Alcohol Use No      History  Drug Use No    Social History   Social History  . Marital Status: Single    Spouse Name: N/A  . Number of Children: N/A  . Years of Education: N/A   Social History Main Topics  . Smoking status: Never Smoker   . Smokeless tobacco: Never Used  . Alcohol Use: No  . Drug Use: No  . Sexual Activity: Not Asked   Other Topics Concern  . None   Social History Narrative   Additional Social History:                          Allergies:   Allergies  Allergen Reactions  . Penicillins Nausea And Vomiting    Labs:  Results for orders placed or performed during the hospital encounter of 06/10/15 (from the past 48 hour(s))  Creatinine clearance, urine, 24 hour     Status: Abnormal   Collection Time: 07/05/15  7:00 AM  Result Value Ref Range   Urine Total Volume-CRCL 4800 mL   Collection Interval-CRCL 24 hours   Creatinine, Urine 23.00 mg/dL   Creatinine, 24H Ur 1104 800 - 2000 mg/day   Creatinine Clearance 22 (L) 75 -  125 mL/min  Renal function panel     Status: Abnormal   Collection Time: 07/05/15 10:14 AM  Result Value Ref Range   Sodium 137 135 - 145 mmol/L   Potassium 3.2 (L) 3.5 - 5.1 mmol/L   Chloride 90 (L) 101 - 111 mmol/L   CO2 32 22 - 32 mmol/L   Glucose, Bld 87 65 - 99 mg/dL   BUN 43 (H) 6 - 20 mg/dL   Creatinine, Ser 3.41 (H) 0.61 - 1.24 mg/dL   Calcium 8.1 (L) 8.9 - 10.3 mg/dL   Phosphorus 3.7 2.5 - 4.6 mg/dL   Albumin 3.3 (L) 3.5 - 5.0 g/dL   GFR calc non Af Amer 18 (L) >60 mL/min   GFR calc Af Amer 21 (L) >60 mL/min    Comment: (NOTE) The eGFR has been calculated using the CKD EPI equation. This calculation has not been validated in all clinical situations. eGFR's persistently <60 mL/min signify possible Chronic Kidney Disease.    Anion gap 15 5 - 15  Renal function panel     Status: Abnormal   Collection Time: 07/06/15  4:55 AM  Result Value Ref Range   Sodium 136 135 - 145 mmol/L   Potassium 3.5 3.5 - 5.1 mmol/L   Chloride  91 (L) 101 - 111 mmol/L   CO2 31 22 - 32 mmol/L   Glucose, Bld 91 65 - 99 mg/dL   BUN 53 (H) 6 - 20 mg/dL   Creatinine, Ser 3.68 (H) 0.61 - 1.24 mg/dL   Calcium 8.1 (L) 8.9 - 10.3 mg/dL   Phosphorus 3.9 2.5 - 4.6 mg/dL   Albumin 3.1 (L) 3.5 - 5.0 g/dL   GFR calc non Af Amer 16 (L) >60 mL/min   GFR calc Af Amer 19 (L) >60 mL/min    Comment: (NOTE) The eGFR has been calculated using the CKD EPI equation. This calculation has not been validated in all clinical situations. eGFR's persistently <60 mL/min signify possible Chronic Kidney Disease.    Anion gap 14 5 - 15    Vitals: Blood pressure 86/47, pulse 78, temperature 97.9 F (36.6 C), temperature source Oral, resp. rate 20, height _0  (1.727 m), weight 96.707 kg (213 lb 3.2 oz), SpO2 94 %.  Risk to Self: Is patient at risk for suicide?: No Risk to Others:   Prior Inpatient Therapy:   Prior Outpatient Therapy:    Current Facility-Administered Medications  Medication Dose Route Frequency Provider Last Rate Last Dose  . alteplase (CATHFLO ACTIVASE) injection 2 mg  2 mg Intracatheter Once PRN Munsoor Lateef, MD      . ammonium lactate (LAC-HYDRIN) 12 % lotion   Topical PRN Florene Glen, MD      . anticoagulant sodium citrate solution 5 mL  5 mL Intravenous PRN Munsoor Lateef, MD      . feeding supplement (BOOST / RESOURCE BREEZE) liquid 1 Container  1 Container Oral TID WC Fritzi Mandes, MD   1 Container at 07/06/15 1334  . furosemide (LASIX) tablet 40 mg  40 mg Oral Daily Lavonia Dana, MD   40 mg at 07/06/15 1006  . heparin injection 5,000 Units  5,000 Units Subcutaneous 3 times per day Fritzi Mandes, MD   5,000 Units at 07/05/15 1445  . hydrALAZINE (APRESOLINE) tablet 25 mg  25 mg Oral 3 times per day Lavonia Dana, MD   25 mg at 07/06/15 1336  . ipratropium-albuterol (DUONEB) 0.5-2.5 (3) MG/3ML nebulizer solution 3 mL  3 mL Nebulization  Q6H PRN Vilinda Boehringer, MD      . isosorbide mononitrate (IMDUR) 24 hr tablet 30 mg  30 mg  Oral Daily Loletha Grayer, MD   30 mg at 07/06/15 1006  . lactulose (CHRONULAC) 10 GM/15ML solution 20 g  20 g Oral BID Laverle Hobby, MD   20 g at 07/06/15 1006  . lidocaine (PF) (XYLOCAINE) 1 % injection 5 mL  5 mL Intradermal PRN Munsoor Lateef, MD      . lidocaine-prilocaine (EMLA) cream 1 application  1 application Topical PRN Munsoor Lateef, MD      . magnesium oxide (MAG-OX) tablet 400 mg  400 mg Oral Daily Fritzi Mandes, MD   400 mg at 07/06/15 1006  . metoprolol (LOPRESSOR) tablet 50 mg  50 mg Oral Q8H Minus Breeding, MD   50 mg at 07/06/15 0834  . ondansetron (ZOFRAN) injection 4 mg  4 mg Intravenous Q6H PRN Colleen Can, MD   4 mg at 06/23/15 0410  . pentafluoroprop-tetrafluoroeth (GEBAUERS) aerosol 1 application  1 application Topical PRN Munsoor Lateef, MD      . potassium chloride SA (K-DUR,KLOR-CON) CR tablet 40 mEq  40 mEq Oral Daily Lavonia Dana, MD   40 mEq at 07/06/15 1006  . senna-docusate (Senokot-S) tablet 1 tablet  1 tablet Oral BID PRN Vilinda Boehringer, MD        Musculoskeletal: Strength & Muscle Tone: decreased Gait & Station: ataxic Patient leans: N/A  Psychiatric Specialty Exam: Physical Exam  Nursing note and vitals reviewed. Constitutional: He appears well-developed and well-nourished.  HENT:  Head: Normocephalic and atraumatic.  Eyes: Conjunctivae are normal. Pupils are equal, round, and reactive to light.  Neck: Normal range of motion.  Cardiovascular: Normal heart sounds.   Respiratory: Effort normal.  GI: Soft.  Musculoskeletal: Normal range of motion. He exhibits tenderness.  Neurological: He is alert.  Skin: Skin is warm and dry.  Psychiatric: He has a normal mood and affect. His speech is normal and behavior is normal. Judgment and thought content normal. Cognition and memory are impaired. He exhibits abnormal recent memory.    Review of Systems  Constitutional: Negative.   HENT: Negative.   Eyes: Negative.   Respiratory: Positive  for cough.   Cardiovascular: Negative.   Gastrointestinal: Negative.   Musculoskeletal: Positive for joint pain and falls.  Skin: Negative.   Neurological: Negative.   Psychiatric/Behavioral: Negative for depression, suicidal ideas, hallucinations and substance abuse. The patient is not nervous/anxious and does not have insomnia.     Blood pressure 86/47, pulse 78, temperature 97.9 F (36.6 C), temperature source Oral, resp. rate 20, height _0  (1.727 m), weight 96.707 kg (213 lb 3.2 oz), SpO2 94 %.Body mass index is 32.42 kg/(m^2).  General Appearance: Disheveled  Eye Contact::  Good  Speech:  Clear and Coherent  Volume:  Normal  Mood:  Euthymic  Affect:  Congruent  Thought Process:  Linear  Orientation:  Full (Time, Place, and Person)  Thought Content:  Negative  Suicidal Thoughts:  No  Homicidal Thoughts:  No  Memory:  Immediate;   Good Recent;   Fair Remote;   Fair  Judgement:  Fair  Insight:  Fair  Psychomotor Activity:  Decreased  Concentration:  Fair  Recall:  Good  Fund of Knowledge:Good  Language: Good  Akathisia:  No  Handed:  Right  AIMS (if indicated):     Assets:  Communication Skills Housing Resilience  ADL's:  Impaired  Cognition: Impaired,  Mild  Sleep:  Medical Decision Making: New problem, with additional work up planned, Review of Psycho-Social Stressors (1), Review or order clinical lab tests (1) and Review of Medication Regimen & Side Effects (2)  Treatment Plan Summary: Plan Question was about capacity. The patient has a 26 out of 30 on a Mini-Mental status. He has memory impairment and a little bit of trouble with his figure drawing but everything else is pretty intact. On interview today he is able to acknowledge his medical problems and the problems they are causing him. He acknowledges that it is dangerous for him to try and take care of himself on his own. The reasons he gives for wanting to get back home rather than go to [place] are  rational he is considering the problem using reasonable information. He understands the risks and benefits. I think I have to say that this patient does have capacity to make decisions. I don't see any evidence of acute depression or psychosis. I explained to the patient that he has some cognitive impairment which could be part of his medical problems or could be a more underlying cognitive disease although he's a little young to have Alzheimer's. I do not recommend starting any medication. Do not recommend IVC. Case discussed with the care manager.  Plan:  No evidence of imminent risk to self or others at present.   Patient does not meet criteria for psychiatric inpatient admission. Supportive therapy provided about ongoing stressors. Disposition: No need for specific psychiatric treatment. We'll follow-up as needed  Alethia Berthold 07/06/2015 4:05 PM

## 2015-07-06 NOTE — Progress Notes (Addendum)
Physical Therapy Treatment Patient Details Name: Aaron Harrison MRN: [id number] DOB: [birth year] Today's Date: 07/06/2015    History of Present Illness presented to ER secondary to weakness, AMS; admitted (and subsequently intubated) for acute hypercarbic/hypoxic respiratory failure secondary to CHF exacerbation.  Hospital course also significant for liver dysfunction, acute encephalopathy and renal failure (requiring CRRT, ended 8/25; now with R temp fem cath); successfully extubated 8/25, currently on 1L O2 via [place].  Patient noted with transfer to CCU over weekend due to SOB/respiratory distress, now improved and returned to floor; new order received to resume PT services this date.    PT Comments    Upon arrival, Aaron Harrison stated that he was feeling good today. Pt performed a supine <> sitting transfer where he immediately described a feeling of lightheadedness. Pt was allowed to rest while BP measurement was taken (87/47) and SPO2% was measured and found to be 88%. Pt educated on pursed lip breathing which was able to raise SPO2% to 92-93% after 1 minute. Pt allowed to remain seated on EOB but complaints on lightheadedness persisted. Pt repositioned into supine with HOB elevated to 30 degrees and BP measurement was taken again (104/49). Pt was instructed through therapeutic exercises in supine but complained of feeling lightheaded and "room spinning" sensation. Pt's nurse was made aware of his condition.   Follow Up Recommendations  SNF     Equipment Recommendations       Recommendations for Other Services       Precautions / Restrictions Precautions Precautions: Fall Restrictions Weight Bearing Restrictions: No    Mobility  Bed Mobility Overal bed mobility: Modified Independent Bed Mobility: Rolling;Supine to Sit;Sit to Supine     Supine to sit: Modified independent (Device/Increase time) Sit to supine: Modified independent (Device/Increase time)   General bed mobility comments:  increased time and use of bedrail required to complete  Transfers       Sit to Stand:  (not performed today due to low BP and lightheadedness)            Ambulation/Gait             General Gait Details: gait not performed today due to low BP and lightheadedness   Stairs            Wheelchair Mobility    Modified Rankin (Stroke Patients Only)       Balance Overall balance assessment: Needs assistance Sitting-balance support: Bilateral upper extremity supported (pt appeard a little unsteady sitting on EOB, need BUE support for balance) Sitting balance-Leahy Scale: Fair Sitting balance - Comments: min assist for unsupported sitting balance Postural control: Posterior lean     Standing balance comment: no standing done today due to low BP and lightheadedness                    Cognition Arousal/Alertness: Lethargic Behavior During Therapy: WFL for tasks assessed/performed Overall Cognitive Status:  (pt seemed distracted today and was unable to follow directions accurately (i.e. asking to move L leg and he moved R several times0)                      Exercises Other Exercises Other Exercises: SLR x 10 bilat, D1 flexion x 5 bilat performed in supine (took a break after first 10 SLRs due to light headedness, could only perform 5 D1 flexion per leg due to feeling of room spinning)    General Comments  Pertinent Vitals/Pain Pain Assessment: No/denies pain    Home Living                      Prior Function            PT Goals (current goals can now be found in the care plan section) Acute Rehab PT Goals Patient Stated Goal: to get stronger PT Goal Formulation: With patient Time For Goal Achievement: 07/07/15 Potential to Achieve Goals: Fair Progress towards PT goals: Progressing toward goals    Frequency  Min 2X/week    PT Plan Current plan remains appropriate    Co-evaluation             End of Session  Equipment Utilized During Treatment: Gait belt Activity Tolerance: Treatment limited secondary to medical complications (Comment) (treatment session was limited today due to the patient feeling light headed upon coming from supine to seated on EOB,) Patient left: in bed;with call bell/phone within reach;with bed alarm set     Time: 1540-1600 PT Time Calculation (min) (ACUTE ONLY): 20 min  Charges:                       G Codes:      Aaron Harrison, SPT 07/06/2015, 4:24 PM    Treatment session lead, directed and documented by Aaron Harrison, SPT.  This patient note, response to treatment and overall treatment plan has been reviewed and this clinician agrees with the information provided.  Aaron Harrison, PT, DPT, NCS 07/09/2015, 1:14 PM [phone]

## 2015-07-06 NOTE — Progress Notes (Signed)
[name] notified of BP 86/47. Hold metoprolol per MD orders.

## 2015-07-06 NOTE — Progress Notes (Signed)
[facility]  Daily Progress Note   Subjective  - 1 Day Post-Op  Doing well.  Making urine.  Likely home first of the week Catheter removed yesterday  Objective Filed Vitals:   07/06/15 0500 07/06/15 0554 07/06/15 0758 07/06/15 1000  BP:  116/53 125/72 123/57  Pulse:   141 76  Temp:   97.5 F (36.4 C)   TempSrc:   Oral   Resp:   18   Height:      Weight: 96.707 kg (213 lb 3.2 oz)     SpO2:   96% 96%    Intake/Output Summary (Last 24 hours) at 07/06/15 1258 Last data filed at 07/06/15 1213  Gross per 24 hour  Intake 1783.93 ml  Output    350 ml  Net 1433.93 ml    PULM  CTAB CV  RRR VASC  Dressing at catheter removal site is C/D/I  Laboratory CBC    Component Value Date/Time   WBC 3.8 07/04/2015 0709   WBC 19.5* 07/16/2013 0239   HGB 9.8* 07/04/2015 0709   HGB 15.2 07/16/2013 0239   HCT 28.9* 07/04/2015 0709   HCT 43.8 07/16/2013 0239   PLT 146* 07/04/2015 0709   PLT 179 07/16/2013 0239    BMET    Component Value Date/Time   NA 136 07/06/2015 0455   NA 137 07/16/2013 0239   K 3.5 07/06/2015 0455   K 3.0* 07/16/2013 0239   CL 91* 07/06/2015 0455   CL 103 07/16/2013 0239   CO2 31 07/06/2015 0455   CO2 25 07/16/2013 0239   GLUCOSE 91 07/06/2015 0455   GLUCOSE 198* 07/16/2013 0239   BUN 53* 07/06/2015 0455   BUN 19* 07/16/2013 0239   CREATININE 3.68* 07/06/2015 0455   CREATININE 1.53* 07/16/2013 0239   CALCIUM 8.1* 07/06/2015 0455   CALCIUM 8.8 07/16/2013 0239   GFRNONAA 16* 07/06/2015 0455   GFRNONAA 49* 07/16/2013 0239   GFRAA 19* 07/06/2015 0455   GFRAA 56* 07/16/2013 0239    Assessment/Planning: POD #1 s/p removal of permcath   Renal function still poor, but not needing dialysis so catheter removed  Site looks good.  Can remove bandage tomorrow  No other recs, will sign off    Aaron Harrison  07/06/2015, 12:58 PM

## 2015-07-06 NOTE — Clinical Social Work Note (Signed)
[name] at [facility] has stated that they will take patient first of the week. She asked that I have Aaron Harrison , the Surveyor, quantity, call her to make the financial/contractual arrangements. CSW has texted Aaron Harrison and faxed him the LOG paperwork. CSW spoke with patient and he is agreeable to go to [facility]. CSW has updated patient's friend, Aaron Harrison, and he has changed his work schedule to be able to drive him there on Tuesday. MD updated this afternoon. Shela Leff MSW,LCSW [phone]

## 2015-07-06 NOTE — Care Management (Signed)
Spoke with Dr Posey Pronto regarding discharge plan. MD stated that physical therapy would work with patient this weekend andpatient would likely discharge on Monday with [facility]. Relayed information to CSW. Continue to follow.

## 2015-07-06 NOTE — Progress Notes (Signed)
[place] at [place] NAME: Aaron Harrison    MR#:  [id number]  DATE OF BIRTH:  1951/09/26  SUBJECTIVE:  Doing well. Breathing better.just got back from HD cath removal  REVIEW OF SYSTEMS:   Review of Systems  Constitutional: Negative for fever, chills and weight loss.  HENT: Negative for ear discharge, ear pain and nosebleeds.   Eyes: Negative for blurred vision, pain and discharge.  Respiratory: Negative for sputum production, shortness of breath, wheezing and stridor.   Cardiovascular: Negative for chest pain, palpitations, orthopnea and PND.  Gastrointestinal: Negative for nausea, vomiting, abdominal pain and diarrhea.  Genitourinary: Negative for urgency and frequency.  Musculoskeletal: Negative for back pain and joint pain.  Neurological: Positive for weakness. Negative for sensory change, speech change and focal weakness.  Psychiatric/Behavioral: Negative for depression. The patient is not nervous/anxious.   All other systems reviewed and are negative.  Tolerating Diet:yes Tolerating PT: rec Rehab vs HHPT  DRUG ALLERGIES:   Allergies  Allergen Reactions  . Penicillins Nausea And Vomiting    VITALS:  Blood pressure 123/57, pulse 76, temperature 97.5 F (36.4 C), temperature source Oral, resp. rate 18, height 5\' 8"  (1.727 m), weight 96.707 kg (213 lb 3.2 oz), SpO2 96 %.  PHYSICAL EXAMINATION:   Physical Exam  GENERAL:  64 y.o.-year-old patient lying in the bed with no acute distress. obses EYES: Pupils equal, round, reactive to light and accommodation. No scleral icterus. Extraocular muscles intact.  HEENT: Head atraumatic, normocephalic. Oropharynx and nasopharynx clear.  NECK:  Supple, no jugular venous distention. No thyroid enlargement, no tenderness.  LUNGS: Normal breath sounds bilaterally, no wheezing, rales, rhonchi. No use of accessory muscles of respiration. HD cath removed CARDIOVASCULAR: S1, S2 normal. No  murmurs, rubs, or gallops.  ABDOMEN: Soft, nontender, nondistended. Bowel sounds present. No organomegaly or mass.  EXTREMITIES: No cyanosis, clubbing or edema b/l.    NEUROLOGIC: Cranial nerves II through XII are intact. No focal Motor or sensory deficits b/l.  Overall weak PSYCHIATRIC: patient is alert and oriented x 2.  SKIN: No obvious rash, lesion, or ulcer.   LABORATORY PANEL:   CBC  Recent Labs Lab 07/04/15 0709  WBC 3.8  HGB 9.8*  HCT 28.9*  PLT 146*    Chemistries   Recent Labs Lab 07/01/15 2114  07/04/15 0709  07/06/15 0455  NA 140  < > 139  < > 136  K 3.3*  < > 2.8*  < > 3.5  CL 99*  < > 91*  < > 91*  CO2 31  < > 37*  < > 31  GLUCOSE 101*  < > 87  < > 91  BUN 42*  < > 44*  < > 53*  CREATININE 3.74*  < > 3.61*  < > 3.68*  CALCIUM 8.3*  < > 7.8*  < > 8.1*  MG  --   < > 1.4*  --   --   AST 22  --   --   --   --   ALT 25  --   --   --   --   ALKPHOS 75  --   --   --   --   BILITOT 2.5*  --   --   --   --   < > = values in this interval not displayed.  ASSESSMENT AND PLAN:   1. Acute respiratory failure with hypoxia- secondary to fluid overload. clinically improved. Diuresing  well with po lasix. Wean oxygen off 2. Acute on chronic systolic congestive heart failure- rales at the bases.  chest x-ray consistent with heart failure. current medical regimen is effective;  continue present plan and medications. Lasix po daily, replete K  Patient is on metoprolol. No ACE inhibitor at this point while trying to figure out whether his kidney function is going to get better or not. Added imdur. 3. Acute renal failure on chronic kidney disease stage III- could be ATN or poststreptococcal. per nephrology no need for HD. HD catheter removed on sept 8th 4. Essential hypertension continue metoprolol, imdur 5. Clinical sepsis, pneumonia, bilateral lower extending cellulitis- patient finished antibiotics course here in the hospital. MSSA and enterococcus pneumonia. 6. Rapid  atrial fibrillation on presentation - rate controlled at this time. Continue metoprolol for rate control, aspirin 7. Hyperkalemia on presentation- improved with dialysis, now with hypokalemia. Will supplement potassium as needed basis.  8. Generalized weakness-consult physical therapy noted. Recommends STR. Pt has not payor source. CSW working on it Pt is keen on going home if he continues to improve as noted in PT's progress. Spoke with PT so see if they can do some extra PT over the weekend.  Case discussed with Care Management/Social Worker. Management plans discussed with the patient and  in agreement.  CODE STATUS: full  DVT Prophylaxis: heparin  TOTAL TIME TAKING CARE OF THIS PATIENT: 25 minutes.  >50% time spent on counselling and coordination of care pt,CSW,RN  POSSIBLE D/C IN next several DAYS, DEPENDING ON CLINICAL CONDITION.   Audreanna Torrisi M.D on 07/06/2015 at 11:05 AM  Between 7am to 6pm - Pager - [phone]  After 6pm go to [email or web address] - password [email or web address] [facility]  [place] [facility]  Office  [phone]  CC: Primary care physician; No PCP Per Patient

## 2015-07-07 ENCOUNTER — Inpatient Hospital Stay: Payer: Medicaid Other

## 2015-07-07 LAB — RENAL FUNCTION PANEL
ANION GAP: 11 (ref 5–15)
Albumin: 3.2 g/dL — ABNORMAL LOW (ref 3.5–5.0)
BUN: 62 mg/dL — AB (ref 6–20)
CHLORIDE: 93 mmol/L — AB (ref 101–111)
CO2: 32 mmol/L (ref 22–32)
Calcium: 8.5 mg/dL — ABNORMAL LOW (ref 8.9–10.3)
Creatinine, Ser: 4.34 mg/dL — ABNORMAL HIGH (ref 0.61–1.24)
GFR calc Af Amer: 15 mL/min — ABNORMAL LOW (ref >60)
GFR, EST NON AFRICAN AMERICAN: 13 mL/min — AB (ref >60)
GLUCOSE: 94 mg/dL (ref 65–99)
POTASSIUM: 3.6 mmol/L (ref 3.5–5.1)
Phosphorus: 4 mg/dL (ref 2.5–4.6)
Sodium: 136 mmol/L (ref 135–145)

## 2015-07-07 LAB — CBC
HEMATOCRIT: 28 % — AB (ref 40.0–52.0)
HEMOGLOBIN: 9.6 g/dL — AB (ref 13.0–18.0)
MCH: 34.1 pg — ABNORMAL HIGH (ref 26.0–34.0)
MCHC: 34.2 g/dL (ref 32.0–36.0)
MCV: 99.6 fL (ref 80.0–100.0)
Platelets: 150 10*3/uL (ref 150–440)
RBC: 2.81 MIL/uL — ABNORMAL LOW (ref 4.40–5.90)
RDW: 15.1 % — AB (ref 11.5–14.5)
WBC: 4.1 10*3/uL (ref 3.8–10.6)

## 2015-07-07 MED ORDER — FUROSEMIDE 10 MG/ML IJ SOLN
40.0000 mg | Freq: Once | INTRAMUSCULAR | Status: AC
  Administered 2015-07-07: 40 mg via INTRAVENOUS
  Filled 2015-07-07: qty 4

## 2015-07-07 NOTE — Progress Notes (Signed)
[place] at [place] NAME: Aaron Harrison    MR#:  [id number]  DATE OF BIRTH:  07/14/1951  SUBJECTIVE:   -Weakness noted, decreased urine output. -Creatinine worsened again. -Blood pressure was low yesterday. Had about 3 bowel movements.  REVIEW OF SYSTEMS:   Review of Systems  Constitutional: Negative for fever, chills and weight loss.  HENT: Negative for ear discharge, ear pain and nosebleeds.   Eyes: Negative for blurred vision, pain and discharge.  Respiratory: Negative for sputum production, shortness of breath, wheezing and stridor.   Cardiovascular: Negative for chest pain, palpitations, orthopnea and PND.  Gastrointestinal: Negative for nausea, vomiting, abdominal pain and diarrhea.  Genitourinary: Negative for urgency and frequency.  Musculoskeletal: Negative for back pain and joint pain.  Neurological: Positive for weakness. Negative for sensory change, speech change and focal weakness.  Psychiatric/Behavioral: Negative for depression. The patient is not nervous/anxious.   All other systems reviewed and are negative.  Tolerating Diet:yes Tolerating PT: rec Rehab vs HHPT  DRUG ALLERGIES:   Allergies  Allergen Reactions  . Penicillins Nausea And Vomiting    VITALS:  Blood pressure 126/63, pulse 68, temperature 98.4 F (36.9 C), temperature source Oral, resp. rate 20, height 5\' 8"  (1.727 m), weight 97.115 kg (214 lb 1.6 oz), SpO2 95 %.  PHYSICAL EXAMINATION:   Physical Exam  GENERAL:  64 y.o.-year-old patient lying in the bed with no acute distress. obses EYES: Pupils equal, round, reactive to light and accommodation. No scleral icterus. Extraocular muscles intact.  HEENT: Head atraumatic, normocephalic. Oropharynx and nasopharynx clear.  NECK:  Supple, no jugular venous distention. No thyroid enlargement, no tenderness.  LUNGS: Normal breath sounds bilaterally, no wheezing,rhonchi. No use of accessory muscles of  respiration. Right basilar crackles heard on exam. CARDIOVASCULAR: S1, S2 normal. No murmurs, rubs, or gallops.  ABDOMEN: Soft, nontender, nondistended. Bowel sounds present. No organomegaly or mass.  EXTREMITIES: No cyanosis, clubbing or edema b/l.    NEUROLOGIC: Cranial nerves II through XII are intact. No focal Motor or sensory deficits b/l.  Overall weak PSYCHIATRIC: patient is alert and oriented x 3.  SKIN: No obvious rash, lesion, or ulcer.   LABORATORY PANEL:   CBC  Recent Labs Lab 07/07/15 0742  WBC 4.1  HGB 9.6*  HCT 28.0*  PLT 150    Chemistries   Recent Labs Lab 07/01/15 2114  07/04/15 0709  07/07/15 0742  NA 140  < > 139  < > 136  K 3.3*  < > 2.8*  < > 3.6  CL 99*  < > 91*  < > 93*  CO2 31  < > 37*  < > 32  GLUCOSE 101*  < > 87  < > 94  BUN 42*  < > 44*  < > 62*  CREATININE 3.74*  < > 3.61*  < > 4.34*  CALCIUM 8.3*  < > 7.8*  < > 8.5*  MG  --   < > 1.4*  --   --   AST 22  --   --   --   --   ALT 25  --   --   --   --   ALKPHOS 75  --   --   --   --   BILITOT 2.5*  --   --   --   --   < > = values in this interval not displayed.  ASSESSMENT AND PLAN:   1. Acute respiratory  failure with hypoxia-on admission. secondary to fluid overload. clinically improved. Diuresing well with po lasix. Wean oxygen off -Patient was intubated, extubated and then reintubated and had a complicated ICU course. -Currently on room air and doing well. Follow-up chest x-ray today.  2. Acute on chronic systolic congestive heart failure- rales at the bases.  chest x-ray consistent with heart failure.  - Continue Lasix daily and POTASSIUM SUPPLEMENTS -CHEST X-RAY. - Echo with diffuse hypokinesis, ejection fraction of 30-35%. - Hold the metoprolol and other blood pressure medications due to hypotension yesterday. -Not on any ACE inhibitor due to hypotension and also renal failure  3. Acute renal failure on chronic kidney disease stage III- could be ATN - Appreciate nephrology  consult. Received temporary dialysis in the hospital and the permacath is taken out. -Urine output has decreased again, hold blood pressure medicines. -Give extra dose of Lasix. -If continues to worsen might need to be started on dialysis again.  4. Essential hypertension- hypotensive yesterday. -Hold metoprolol, Imdur and hydralazine for now  5. Sepsis- due to pneumonia, bilateral lower extending cellulitis- patient finished antibiotics course here in the hospital. MSSA and enterococcus pneumonia. - f/u CXR today  6. Rapid atrial fibrillation on presentation - rate controlled at this time.  - metoprolol on hold due to hypotension.   7. Generalized weakness-consult physical therapy noted. Recommends STR.  - Appreciate social worker consult. -Has a walker at the present time.  Case discussed with Care Management/Social Worker. Management plans discussed with the patient and  in agreement.  CODE STATUS: full  DVT Prophylaxis: heparin  TOTAL TIME TAKING CARE OF THIS PATIENT: 35 minutes.   POSSIBLE D/C IN next several DAYS, DEPENDING ON CLINICAL CONDITION.   Gladstone Lighter M.D on 07/07/2015 at 11:22 AM  Between 7am to 6pm - Pager - [phone]  After 6pm go to [email or web address] - password [email or web address]  [place] [facility]  Office  [phone]  CC: Primary care physician; No PCP Per Patient

## 2015-07-07 NOTE — Progress Notes (Signed)
Subjective:   UOP 350. Worsening renal function and urine output.  Patient seems to be in denial.   Objective:  Vital signs in last 24 hours:  Temp:  [97.9 F (36.6 C)-98.4 F (36.9 C)] 98.4 F (36.9 C) (09/10 0735) Pulse Rate:  [68-85] 68 (09/10 0735) Resp:  [20] 20 (09/09 2311) BP: (86-129)/(47-63) 126/63 mmHg (09/10 0735) SpO2:  [88 %-96 %] 95 % (09/10 0735) Weight:  [97.115 kg (214 lb 1.6 oz)] 97.115 kg (214 lb 1.6 oz) (09/10 0254)  Weight change: 0.408 kg (14.4 oz) Filed Weights   07/05/15 0300 07/06/15 0500 07/07/15 0254  Weight: 97.251 kg (214 lb 6.4 oz) 96.707 kg (213 lb 3.2 oz) 97.115 kg (214 lb 1.6 oz)    Intake/Output: I/O last 3 completed shifts: In: 1457.9 [P.O.:1300; I.V.:157.9] Out: 450 [Urine:450]   Intake/Output this shift:     Physical Exam: General: NAD  Head: Normocephalic, atraumatic.  ENT Moist mucus membranes hearing intact  Neck: Supple, trachea midline  Lungs:  clear  Heart: Irregular S1S2 no rubs  Abdomen:  nontender, mild distension, BS+   Extremities:  trace generalized edema  Neurologic:  resting comfortably   Skin: ulcertaion bilateral lower extremities on shins - in UNNA boots  Access: none    Basic Metabolic Panel:  Recent Labs Lab 07/01/15 0539  07/02/15 0801 07/03/15 1027 07/04/15 0709 07/04/15 0813 07/05/15 1014 07/06/15 0455 07/07/15 0742  NA 138  < > 139 130*  134* 139 138 137 136 136  K 3.1*  < > 3.1* 2.4*  2.5* 2.8* 2.9* 3.2* 3.5 3.6  CL 99*  < > 98* 86*  90* 91* 91* 90* 91* 93*  CO2 29  < > 30 27  33* 37* 35* 32 31 32  GLUCOSE 112*  < > 88 125*  132* 87 81 87 91 94  BUN 38*  < > 41* 46*  46* 44* 43* 43* 53* 62*  CREATININE 3.79*  < > 3.97* 3.91*  3.96* 3.61* 3.44* 3.41* 3.68* 4.34*  CALCIUM 8.2*  < > 8.1* 7.2*  7.7* 7.8* 8.0* 8.1* 8.1* 8.5*  MG 1.9  --  1.7 1.4* 1.4*  --   --   --   --   PHOS 4.3  --  5.4* 3.8 3.6 3.6 3.7 3.9 4.0  < > = values in this interval not displayed.  Liver Function  Tests:  Recent Labs Lab 07/01/15 2114  07/04/15 0709 07/04/15 0813 07/05/15 1014 07/06/15 0455 07/07/15 0742  AST 22  --   --   --   --   --   --   ALT 25  --   --   --   --   --   --   ALKPHOS 75  --   --   --   --   --   --   BILITOT 2.5*  --   --   --   --   --   --   PROT 6.4*  --   --   --   --   --   --   ALBUMIN 3.0*  < > 3.1* 3.2* 3.3* 3.1* 3.2*  < > = values in this interval not displayed. No results for input(s): LIPASE, AMYLASE in the last 168 hours. No results for input(s): AMMONIA in the last 168 hours.  CBC:  Recent Labs Lab 07/01/15 2114 07/03/15 1027 07/04/15 0709 07/07/15 0742  WBC 5.6 4.9 3.8 4.1  NEUTROABS 4.4  --   --   --  HGB 8.6* 9.8* 9.8* 9.6*  HCT 25.7* 28.9* 28.9* 28.0*  MCV 101.7* 100.6* 99.9 99.6  PLT 99* 133* 146* 150    Cardiac Enzymes: No results for input(s): CKTOTAL, CKMB, CKMBINDEX, TROPONINI in the last 168 hours.  BNP: Invalid input(s): POCBNP  CBG:  Recent Labs Lab 07/01/15 2002  GLUCAP 126*    Microbiology: Results for orders placed or performed during the hospital encounter of 06/10/15  Blood Culture (routine x 2)     Status: None   Collection Time: 06/10/15  2:35 PM  Result Value Ref Range Status   Specimen Description BLOOD RIGHT WRIST  Final   Special Requests BOTTLES DRAWN AEROBIC AND ANAEROBIC  [place]  Final   Culture NO GROWTH 5 DAYS  Final   Report Status 06/15/2015 FINAL  Final  Blood Culture (routine x 2)     Status: None   Collection Time: 06/10/15  2:40 PM  Result Value Ref Range Status   Specimen Description BLOOD LEFT ASSIST CONTROL  Final   Special Requests   Final    BOTTLES DRAWN AEROBIC AND ANAEROBIC  AER 6CC ANA 4CC   Culture NO GROWTH 5 DAYS  Final   Report Status 06/15/2015 FINAL  Final  Urine culture     Status: None   Collection Time: 06/10/15  4:42 PM  Result Value Ref Range Status   Specimen Description URINE, RANDOM  Final   Special Requests NONE  Final   Culture 4,000 COLONIES/mL  INSIGNIFICANT GROWTH  Final   Report Status 06/12/2015 FINAL  Final  MRSA PCR Screening     Status: None   Collection Time: 06/10/15  6:27 PM  Result Value Ref Range Status   MRSA by PCR NEGATIVE NEGATIVE Final    Comment:        The GeneXpert MRSA Assay (FDA approved for NASAL specimens only), is one component of a comprehensive MRSA colonization surveillance program. It is not intended to diagnose MRSA infection nor to guide or monitor treatment for MRSA infections.   Culture, sputum-assessment     Status: None   Collection Time: 06/11/15  2:50 PM  Result Value Ref Range Status   Specimen Description SPUTUM  Final   Special Requests NONE  Final   Sputum evaluation THIS SPECIMEN IS ACCEPTABLE FOR SPUTUM CULTURE  Final   Report Status 06/13/2015 FINAL  Final  Culture, respiratory (NON-Expectorated)     Status: None   Collection Time: 06/11/15  2:50 PM  Result Value Ref Range Status   Specimen Description SPUTUM  Final   Special Requests NONE Reflexed from [id number]  Final   Gram Stain   Final    MODERATE WBC SEEN RARE GRAM POSITIVE COCCI GOOD SPECIMEN - 80-90% WBCS    Culture MODERATE GROWTH STAPHYLOCOCCUS AUREUS  Final   Report Status 06/14/2015 FINAL  Final   Organism ID, Bacteria STAPHYLOCOCCUS AUREUS  Final      Susceptibility   Staphylococcus aureus - MIC*    CIPROFLOXACIN <=0.5 SENSITIVE Sensitive     GENTAMICIN <=0.5 SENSITIVE Sensitive     OXACILLIN 0.5 SENSITIVE Sensitive     TRIMETH/SULFA <=10 SENSITIVE Sensitive     CEFOXITIN SCREEN NEGATIVE Sensitive     Inducible Clindamycin NEGATIVE Sensitive     TETRACYCLINE Value in next row Sensitive      SENSITIVE<=1    * MODERATE GROWTH STAPHYLOCOCCUS AUREUS  Wound culture     Status: None   Collection Time: 06/13/15  1:56 PM  Result Value Ref  Range Status   Specimen Description LEG  Final   Special Requests Normal  Final   Gram Stain   Final    FEW WBC SEEN MODERATE GRAM NEGATIVE RODS FEW GRAM POSITIVE COCCI     Culture   Final    LIGHT GROWTH STAPHYLOCOCCUS AUREUS RARE GROWTH PROTEUS PENNERI RARE GROWTH ENTEROCOCCUS FAECALIS    Report Status 06/17/2015 FINAL  Final   Organism ID, Bacteria STAPHYLOCOCCUS AUREUS  Final   Organism ID, Bacteria PROTEUS PENNERI  Final   Organism ID, Bacteria ENTEROCOCCUS FAECALIS  Final      Susceptibility   Staphylococcus aureus - MIC*    CIPROFLOXACIN <=0.5 SENSITIVE Sensitive     GENTAMICIN <=0.5 SENSITIVE Sensitive     OXACILLIN 0.5 SENSITIVE Sensitive     TRIMETH/SULFA <=10 SENSITIVE Sensitive     CEFOXITIN SCREEN NEGATIVE Sensitive     Inducible Clindamycin NEGATIVE Sensitive     TETRACYCLINE Value in next row Sensitive      SENSITIVE<=1    * LIGHT GROWTH STAPHYLOCOCCUS AUREUS   Proteus penneri - MIC*    AMPICILLIN Value in next row Resistant      SENSITIVE<=1    CEFAZOLIN Value in next row Resistant      SENSITIVE<=1    CEFTRIAXONE Value in next row Sensitive      SENSITIVE<=1    CIPROFLOXACIN Value in next row Sensitive      SENSITIVE<=1    GENTAMICIN Value in next row Sensitive      SENSITIVE<=1    IMIPENEM Value in next row Sensitive      SENSITIVE<=1    NITROFURANTOIN Value in next row Resistant      SENSITIVE<=1    TRIMETH/SULFA Value in next row Sensitive      SENSITIVE<=1    PIP/TAZO Value in next row Sensitive      SENSITIVE<=4    * RARE GROWTH PROTEUS PENNERI   Enterococcus faecalis - MIC*    AMPICILLIN Value in next row Sensitive      SENSITIVE<=4    LINEZOLID Value in next row Sensitive      SENSITIVE<=4    * RARE GROWTH ENTEROCOCCUS FAECALIS  Wound culture     Status: None   Collection Time: 06/13/15  1:58 PM  Result Value Ref Range Status   Specimen Description LEG  Final   Special Requests Normal  Final   Gram Stain RARE WBC SEEN FEW GRAM NEGATIVE RODS   Final   Culture NO GROWTH 4 DAYS  Final   Report Status 06/17/2015 FINAL  Final  MRSA PCR Screening     Status: None   Collection Time: 07/01/15  7:40 PM  Result  Value Ref Range Status   MRSA by PCR NEGATIVE NEGATIVE Final    Comment:        The GeneXpert MRSA Assay (FDA approved for NASAL specimens only), is one component of a comprehensive MRSA colonization surveillance program. It is not intended to diagnose MRSA infection nor to guide or monitor treatment for MRSA infections.   Cath Tip Culture     Status: None   Collection Time: 07/01/15  9:14 PM  Result Value Ref Range Status   Specimen Description CATH TIP  Final   Special Requests NONE  Final   Culture NO GROWTH 3 DAYS  Final   Report Status 07/04/2015 FINAL  Final    Coagulation Studies: No results for input(s): LABPROT, INR in the last 72 hours.  Urinalysis: No results for input(s):  COLORURINE, LABSPEC, [place], GLUCOSEU, HGBUR, BILIRUBINUR, KETONESUR, PROTEINUR, UROBILINOGEN, NITRITE, LEUKOCYTESUR in the last 72 hours.  Invalid input(s): APPERANCEUR    Imaging: No results found.   Medications:     . feeding supplement  1 Container Oral TID WC  . furosemide  40 mg Oral Daily  . heparin subcutaneous  5,000 Units Subcutaneous 3 times per day  . hydrALAZINE  25 mg Oral 3 times per day  . isosorbide mononitrate  30 mg Oral Daily  . lactulose  20 g Oral BID  . magnesium oxide  400 mg Oral Daily  . metoprolol tartrate  50 mg Oral Q8H  . potassium chloride  40 mEq Oral Daily   alteplase, ammonium lactate, anticoagulant sodium citrate, ipratropium-albuterol, lidocaine (PF), lidocaine-prilocaine, ondansetron (ZOFRAN) IV, pentafluoroprop-tetrafluoroeth, senna-docusate  Assessment/ Plan:  64 y.o. male with a PMHx of congestive heart failure ejection fraction 30-35%, prior pneumonia, lower extremity edema with cellulitis, atrial fibrillation who was admitted to [facility] on 06/10/2015 for evaluation of altered mental status and shortness of breath. Upon admission the patient underwent intubation with transition to a mechanical ventilation.  1. Acute renal failure/chronic kidney  disease stage III with baseline creatinine 1.3 with proteinuria. The patient most likely has acute renal failure secondary to altered cardiorenal hemodynamics. However now more concerning for progression to End Stage Renal Disease - hemodialysis last on 9/2, UF of 2 litres. Permcath removed on 9/8. - Agree with furosemide - will increase to IV 40mg  one time bolus to see if urine output increases.  - Nonoliguric urine output. Will need stict I&Os.   2. Hypokalemia: due to post ATN diuresis and furosemide - potassium PO.   3. Acute respiratory failure. From underlying heart failure/pulmonary edema. Had period of intubation. Continue to monitor respiratory status closely.  Continue furosemide  4.generalized edema from  Acute systolic heart failure. EF 30-35%. - furosemide  5. Hypertension: on metoprolol, hydralazine, and isosorbide mononitrate.    LOS: [place], [place] 9/10/20168:58 AM

## 2015-07-08 LAB — RENAL FUNCTION PANEL
ALBUMIN: 3.3 g/dL — AB (ref 3.5–5.0)
ANION GAP: 11 (ref 5–15)
BUN: 70 mg/dL — ABNORMAL HIGH (ref 6–20)
CALCIUM: 8.8 mg/dL — AB (ref 8.9–10.3)
CO2: 32 mmol/L (ref 22–32)
CREATININE: 4.95 mg/dL — AB (ref 0.61–1.24)
Chloride: 93 mmol/L — ABNORMAL LOW (ref 101–111)
GFR calc non Af Amer: 11 mL/min — ABNORMAL LOW (ref >60)
GFR, EST AFRICAN AMERICAN: 13 mL/min — AB (ref >60)
GLUCOSE: 96 mg/dL (ref 65–99)
PHOSPHORUS: 4.4 mg/dL (ref 2.5–4.6)
Potassium: 3.9 mmol/L (ref 3.5–5.1)
SODIUM: 136 mmol/L (ref 135–145)

## 2015-07-08 LAB — CBC
HCT: 28 % — ABNORMAL LOW (ref 40.0–52.0)
HEMOGLOBIN: 9.5 g/dL — AB (ref 13.0–18.0)
MCH: 33.9 pg (ref 26.0–34.0)
MCHC: 34 g/dL (ref 32.0–36.0)
MCV: 99.7 fL (ref 80.0–100.0)
PLATELETS: 141 10*3/uL — AB (ref 150–440)
RBC: 2.81 MIL/uL — ABNORMAL LOW (ref 4.40–5.90)
RDW: 15.1 % — ABNORMAL HIGH (ref 11.5–14.5)
WBC: 3.7 10*3/uL — ABNORMAL LOW (ref 3.8–10.6)

## 2015-07-08 NOTE — Progress Notes (Addendum)
Refused to have his unna boots changed and patient also refused heparin injection.

## 2015-07-08 NOTE — Clinical Social Work Note (Signed)
Dr. Abigail Butts has now determined patient is ESRD and that patient will require permanent outpatient hemodialysis. Due to this, it is doubtful that the [facility] facility will take patient but CSW will call [name] at [facility] tomorrow and inform her of the change. Patient would then have to have an outpatient dialysis center willing to take him in the [place] area.  Shela Leff MSW,LCSW [phone]

## 2015-07-08 NOTE — Progress Notes (Signed)
[facility] [place] NAME: Aaron Harrison    MR#:  [id number]  DATE OF BIRTH:  [birth year]  SUBJECTIVE:   -Continues to feel better. Creatinine worsening and urine output only up to 700 cc over 24 hours. -Likely will need permacath and dialysis again.  REVIEW OF SYSTEMS:   Review of Systems  Constitutional: Negative for fever, chills and weight loss.  HENT: Negative for ear discharge, ear pain and nosebleeds.   Eyes: Negative for blurred vision, pain and discharge.  Respiratory: Negative for sputum production, shortness of breath, wheezing and stridor.   Cardiovascular: Negative for chest pain, palpitations, orthopnea and PND.  Gastrointestinal: Negative for nausea, vomiting, abdominal pain and diarrhea.  Genitourinary: Negative for urgency and frequency.  Musculoskeletal: Negative for back pain and joint pain.  Neurological: Positive for weakness. Negative for sensory change, speech change and focal weakness.  Psychiatric/Behavioral: Negative for depression. The patient is not nervous/anxious.   All other systems reviewed and are negative.  Tolerating Diet:yes Tolerating PT: rec Rehab vs HHPT  DRUG ALLERGIES:   Allergies  Allergen Reactions  . Penicillins Nausea And Vomiting    VITALS:  Blood pressure 134/66, pulse 74, temperature 97.9 F (36.6 C), temperature source Oral, resp. rate 16, height 5\' 8"  (1.727 m), weight 98.431 kg (217 lb), SpO2 99 %.  PHYSICAL EXAMINATION:   Physical Exam  GENERAL:  64 y.o.-year-old patient lying in the bed with no acute distress. EYES: Pupils equal, round, reactive to light and accommodation. No scleral icterus. Extraocular muscles intact.  HEENT: Head atraumatic, normocephalic. Oropharynx and nasopharynx clear.  NECK:  Supple, no jugular venous distention. No thyroid enlargement, no tenderness.  LUNGS: Normal breath sounds bilaterally, no wheezing,rhonchi. No use of accessory muscles of  respiration. Right basilar crackles heard on exam. CARDIOVASCULAR: S1, S2 normal. No murmurs, rubs, or gallops.  ABDOMEN: Soft, nontender, nondistended. Bowel sounds present. No organomegaly or mass.  EXTREMITIES: No cyanosis, clubbing or edema b/l.    NEUROLOGIC: Cranial nerves II through XII are intact. No focal Motor or sensory deficits b/l.  Overall weak PSYCHIATRIC: patient is alert and oriented x 3.  SKIN: No obvious rash, lesion, or ulcer.   LABORATORY PANEL:   CBC  Recent Labs Lab 07/08/15 0520  WBC 3.7*  HGB 9.5*  HCT 28.0*  PLT 141*    Chemistries   Recent Labs Lab 07/01/15 2114  07/04/15 0709  07/08/15 0520  NA 140  < > 139  < > 136  K 3.3*  < > 2.8*  < > 3.9  CL 99*  < > 91*  < > 93*  CO2 31  < > 37*  < > 32  GLUCOSE 101*  < > 87  < > 96  BUN 42*  < > 44*  < > 70*  CREATININE 3.74*  < > 3.61*  < > 4.95*  CALCIUM 8.3*  < > 7.8*  < > 8.8*  MG  --   < > 1.4*  --   --   AST 22  --   --   --   --   ALT 25  --   --   --   --   ALKPHOS 75  --   --   --   --   BILITOT 2.5*  --   --   --   --   < > = values in this interval not displayed.  ASSESSMENT AND PLAN:  1. Acute respiratory failure with hypoxia-on admission. secondary to fluid overload. clinically improved. Diuresing well with po lasix. Off oxygen now -Patient was intubated, extubated and then reintubated and had a complicated ICU course. -Currently on room air and doing well.  2. Acute on chronic systolic congestive heart failure- rales at the bases.  chest x-ray consistent with heart failure.  - Continue Lasix daily and POTASSIUM SUPPLEMENTS - Echo with diffuse hypokinesis, ejection fraction of 30-35%. - Hold the metoprolol and other blood pressure medications due to hypotension. -Not on any ACE inhibitor due to hypotension and also renal failure  3. Acute renal failure on chronic kidney disease stage III-now progressed to end-stage renal disease per nephrology - Appreciate nephrology consult.  Received temporary dialysis in the hospital and the permacath is taken out. But looks like his creatinine is worsening and urine output decreased without dialysis. So he will need to be placed back on dialysis. Discussed with nephrology. Permacath needs to be put in again likely tomorrow. -Urine output has decreased again, hold blood pressure medicines.  4. Essential hypertension- blood pressure within normal limits while medications are on hold. -Hold metoprolol, Imdur and hydralazine for now  5. Sepsis- due to pneumonia, bilateral lower extending cellulitis- patient finished antibiotics course here in the hospital. MSSA and enterococcus pneumonia. - f/u CXR with no infiltrate noted  6. Rapid atrial fibrillation on presentation - rate controlled at this time.  - metoprolol on hold due to hypotension.   7. Generalized weakness-consult physical therapy noted. Recommends STR.  - Appreciate social worker consult. -Has a walker at the present time. -Continue to work with physical therapy  Case discussed with Care Management/Social Worker. Management plans discussed with the patient and  in agreement.  CODE STATUS: full  DVT Prophylaxis: heparin  TOTAL TIME TAKING CARE OF THIS PATIENT: 35 minutes.   POSSIBLE D/C IN next several DAYS, DEPENDING ON CLINICAL CONDITION.   Gladstone Lighter M.D on 07/08/2015 at 11:43 AM  Between 7am to 6pm - Pager - [phone]  After 6pm go to [email or web address] - password [email or web address] [facility]  [facility]  Office  [phone]  CC: Primary care physician; No PCP Per Patient

## 2015-07-08 NOTE — Care Management Note (Signed)
I see that patient is now ESRD. I have all records needed for outpatient dialysis referral.  Tomorrow 07/09/15 I will meet with the patient to discuss clinic options.  Of important notice patient is still pending Medicaid and I will follow up with the insurance dept. tomorrow for updated application information. [facility] Liaison [phone]

## 2015-07-08 NOTE — Progress Notes (Signed)
Subjective:   UOP 700 after IV furosemide administration. However creatinine 4.95 with eGFR of 11 and BUN rising to 70.   Objective:  Vital signs in last 24 hours:  Temp:  [97.9 F (36.6 C)-98.4 F (36.9 C)] 97.9 F (36.6 C) (09/11 0607) Pulse Rate:  [74-81] 74 (09/11 0607) Resp:  [16-18] 16 (09/11 0607) BP: (95-134)/(46-68) 134/66 mmHg (09/11 0607) SpO2:  [94 %-99 %] 99 % (09/11 0607) Weight:  [98.431 kg (217 lb)] 98.431 kg (217 lb) (09/11 0100)  Weight change: 1.315 kg (2 lb 14.4 oz) Filed Weights   07/06/15 0500 07/07/15 0254 07/08/15 0100  Weight: 96.707 kg (213 lb 3.2 oz) 97.115 kg (214 lb 1.6 oz) 98.431 kg (217 lb)    Intake/Output: I/O last 3 completed shifts: In: 840 [P.O.:840] Out: 950 [Urine:950]   Intake/Output this shift:  Total I/O In: 240 [P.O.:240] Out: 100 [Urine:100]  Physical Exam: General: NAD  Head: Normocephalic, atraumatic.  ENT Moist mucus membranes hearing intact  Neck: Supple, trachea midline  Lungs:  clear  Heart: Irregular S1S2 no rubs  Abdomen:  nontender, mild distension, BS+   Extremities:  trace generalized edema  Neurologic:  resting comfortably   Skin: ulcertaion bilateral lower extremities on shins - in UNNA boots  Access: none    Basic Metabolic Panel:  Recent Labs Lab 07/02/15 0801 07/03/15 1027 07/04/15 0709 07/04/15 0813 07/05/15 1014 07/06/15 0455 07/07/15 0742 07/08/15 0520  NA 139 130*  134* 139 138 137 136 136 136  K 3.1* 2.4*  2.5* 2.8* 2.9* 3.2* 3.5 3.6 3.9  CL 98* 86*  90* 91* 91* 90* 91* 93* 93*  CO2 30 27  33* 37* 35* 32 31 32 32  GLUCOSE 88 125*  132* 87 81 87 91 94 96  BUN 41* 46*  46* 44* 43* 43* 53* 62* 70*  CREATININE 3.97* 3.91*  3.96* 3.61* 3.44* 3.41* 3.68* 4.34* 4.95*  CALCIUM 8.1* 7.2*  7.7* 7.8* 8.0* 8.1* 8.1* 8.5* 8.8*  MG 1.7 1.4* 1.4*  --   --   --   --   --   PHOS 5.4* 3.8 3.6 3.6 3.7 3.9 4.0 4.4    Liver Function Tests:  Recent Labs Lab 07/01/15 2114  07/04/15 0813  07/05/15 1014 07/06/15 0455 07/07/15 0742 07/08/15 0520  AST 22  --   --   --   --   --   --   ALT 25  --   --   --   --   --   --   ALKPHOS 75  --   --   --   --   --   --   BILITOT 2.5*  --   --   --   --   --   --   PROT 6.4*  --   --   --   --   --   --   ALBUMIN 3.0*  < > 3.2* 3.3* 3.1* 3.2* 3.3*  < > = values in this interval not displayed. No results for input(s): LIPASE, AMYLASE in the last 168 hours. No results for input(s): AMMONIA in the last 168 hours.  CBC:  Recent Labs Lab 07/01/15 2114 07/03/15 1027 07/04/15 0709 07/07/15 0742 07/08/15 0520  WBC 5.6 4.9 3.8 4.1 3.7*  NEUTROABS 4.4  --   --   --   --   HGB 8.6* 9.8* 9.8* 9.6* 9.5*  HCT 25.7* 28.9* 28.9* 28.0* 28.0*  MCV 101.7* 100.6* 99.9 99.6  99.7  PLT 99* 133* 146* 150 141*    Cardiac Enzymes: No results for input(s): CKTOTAL, CKMB, CKMBINDEX, TROPONINI in the last 168 hours.  BNP: Invalid input(s): POCBNP  CBG:  Recent Labs Lab 07/01/15 2002  GLUCAP 126*    Microbiology: Results for orders placed or performed during the hospital encounter of 06/10/15  Blood Culture (routine x 2)     Status: None   Collection Time: 06/10/15  2:35 PM  Result Value Ref Range Status   Specimen Description BLOOD RIGHT WRIST  Final   Special Requests BOTTLES DRAWN AEROBIC AND ANAEROBIC  1CC  Final   Culture NO GROWTH 5 DAYS  Final   Report Status 06/15/2015 FINAL  Final  Blood Culture (routine x 2)     Status: None   Collection Time: 06/10/15  2:40 PM  Result Value Ref Range Status   Specimen Description BLOOD LEFT ASSIST CONTROL  Final   Special Requests   Final    BOTTLES DRAWN AEROBIC AND ANAEROBIC  AER 6CC ANA 4CC   Culture NO GROWTH 5 DAYS  Final   Report Status 06/15/2015 FINAL  Final  Urine culture     Status: None   Collection Time: 06/10/15  4:42 PM  Result Value Ref Range Status   Specimen Description URINE, RANDOM  Final   Special Requests NONE  Final   Culture 4,000 COLONIES/mL INSIGNIFICANT  GROWTH  Final   Report Status 06/12/2015 FINAL  Final  MRSA PCR Screening     Status: None   Collection Time: 06/10/15  6:27 PM  Result Value Ref Range Status   MRSA by PCR NEGATIVE NEGATIVE Final    Comment:        The GeneXpert MRSA Assay (FDA approved for NASAL specimens only), is one component of a comprehensive MRSA colonization surveillance program. It is not intended to diagnose MRSA infection nor to guide or monitor treatment for MRSA infections.   Culture, sputum-assessment     Status: None   Collection Time: 06/11/15  2:50 PM  Result Value Ref Range Status   Specimen Description SPUTUM  Final   Special Requests NONE  Final   Sputum evaluation THIS SPECIMEN IS ACCEPTABLE FOR SPUTUM CULTURE  Final   Report Status 06/13/2015 FINAL  Final  Culture, respiratory (NON-Expectorated)     Status: None   Collection Time: 06/11/15  2:50 PM  Result Value Ref Range Status   Specimen Description SPUTUM  Final   Special Requests NONE Reflexed from [id number]  Final   Gram Stain   Final    MODERATE WBC SEEN RARE GRAM POSITIVE COCCI GOOD SPECIMEN - 80-90% WBCS    Culture MODERATE GROWTH STAPHYLOCOCCUS AUREUS  Final   Report Status 06/14/2015 FINAL  Final   Organism ID, Bacteria STAPHYLOCOCCUS AUREUS  Final      Susceptibility   Staphylococcus aureus - MIC*    CIPROFLOXACIN <=0.5 SENSITIVE Sensitive     GENTAMICIN <=0.5 SENSITIVE Sensitive     OXACILLIN 0.5 SENSITIVE Sensitive     TRIMETH/SULFA <=10 SENSITIVE Sensitive     CEFOXITIN SCREEN NEGATIVE Sensitive     Inducible Clindamycin NEGATIVE Sensitive     TETRACYCLINE Value in next row Sensitive      SENSITIVE<=1    * MODERATE GROWTH STAPHYLOCOCCUS AUREUS  Wound culture     Status: None   Collection Time: 06/13/15  1:56 PM  Result Value Ref Range Status   Specimen Description LEG  Final   Special Requests Normal  Final   Gram Stain   Final    FEW WBC SEEN MODERATE GRAM NEGATIVE RODS FEW GRAM POSITIVE COCCI    Culture    Final    LIGHT GROWTH STAPHYLOCOCCUS AUREUS RARE GROWTH PROTEUS PENNERI RARE GROWTH ENTEROCOCCUS FAECALIS    Report Status 06/17/2015 FINAL  Final   Organism ID, Bacteria STAPHYLOCOCCUS AUREUS  Final   Organism ID, Bacteria PROTEUS PENNERI  Final   Organism ID, Bacteria ENTEROCOCCUS FAECALIS  Final      Susceptibility   Staphylococcus aureus - MIC*    CIPROFLOXACIN <=0.5 SENSITIVE Sensitive     GENTAMICIN <=0.5 SENSITIVE Sensitive     OXACILLIN 0.5 SENSITIVE Sensitive     TRIMETH/SULFA <=10 SENSITIVE Sensitive     CEFOXITIN SCREEN NEGATIVE Sensitive     Inducible Clindamycin NEGATIVE Sensitive     TETRACYCLINE Value in next row Sensitive      SENSITIVE<=1    * LIGHT GROWTH STAPHYLOCOCCUS AUREUS   Proteus penneri - MIC*    AMPICILLIN Value in next row Resistant      SENSITIVE<=1    CEFAZOLIN Value in next row Resistant      SENSITIVE<=1    CEFTRIAXONE Value in next row Sensitive      SENSITIVE<=1    CIPROFLOXACIN Value in next row Sensitive      SENSITIVE<=1    GENTAMICIN Value in next row Sensitive      SENSITIVE<=1    IMIPENEM Value in next row Sensitive      SENSITIVE<=1    NITROFURANTOIN Value in next row Resistant      SENSITIVE<=1    TRIMETH/SULFA Value in next row Sensitive      SENSITIVE<=1    PIP/TAZO Value in next row Sensitive      SENSITIVE<=4    * RARE GROWTH PROTEUS PENNERI   Enterococcus faecalis - MIC*    AMPICILLIN Value in next row Sensitive      SENSITIVE<=4    LINEZOLID Value in next row Sensitive      SENSITIVE<=4    * RARE GROWTH ENTEROCOCCUS FAECALIS  Wound culture     Status: None   Collection Time: 06/13/15  1:58 PM  Result Value Ref Range Status   Specimen Description LEG  Final   Special Requests Normal  Final   Gram Stain RARE WBC SEEN FEW GRAM NEGATIVE RODS   Final   Culture NO GROWTH 4 DAYS  Final   Report Status 06/17/2015 FINAL  Final  MRSA PCR Screening     Status: None   Collection Time: 07/01/15  7:40 PM  Result Value Ref  Range Status   MRSA by PCR NEGATIVE NEGATIVE Final    Comment:        The GeneXpert MRSA Assay (FDA approved for NASAL specimens only), is one component of a comprehensive MRSA colonization surveillance program. It is not intended to diagnose MRSA infection nor to guide or monitor treatment for MRSA infections.   Cath Tip Culture     Status: None   Collection Time: 07/01/15  9:14 PM  Result Value Ref Range Status   Specimen Description CATH TIP  Final   Special Requests NONE  Final   Culture NO GROWTH 3 DAYS  Final   Report Status 07/04/2015 FINAL  Final    Coagulation Studies: No results for input(s): LABPROT, INR in the last 72 hours.  Urinalysis: No results for input(s): COLORURINE, LABSPEC, PHURINE, GLUCOSEU, HGBUR, BILIRUBINUR, KETONESUR, PROTEINUR, UROBILINOGEN, NITRITE, LEUKOCYTESUR in the last 72  hours.  Invalid input(s): APPERANCEUR    Imaging: Dg Chest 2 View  07/07/2015   CLINICAL DATA:  Acute respiratory failure  EXAM: CHEST  2 VIEW  COMPARISON:  07/01/2015  FINDINGS: Moderate cardiac enlargement. Mild interstitial edema. Bilateral pleural effusions are identified. No airspace consolidation.  IMPRESSION: 1. Mild to moderate CHF.   Electronically Signed   By: Kerby Moors M.D.   On: 07/07/2015 12:31     Medications:     . feeding supplement  1 Container Oral TID WC  . furosemide  40 mg Oral Daily  . heparin subcutaneous  5,000 Units Subcutaneous 3 times per day  . magnesium oxide  400 mg Oral Daily  . potassium chloride  40 mEq Oral Daily   alteplase, ammonium lactate, anticoagulant sodium citrate, ipratropium-albuterol, lidocaine (PF), lidocaine-prilocaine, ondansetron (ZOFRAN) IV, pentafluoroprop-tetrafluoroeth, senna-docusate  Assessment/ Plan:  64 y.o. male with a PMHx of congestive heart failure ejection fraction 30-35%, prior pneumonia, lower extremity edema with cellulitis, atrial fibrillation who was admitted to [facility] on 06/10/2015 for evaluation of  altered mental status and shortness of breath. Upon admission the patient underwent intubation with transition to a mechanical ventilation.  1. End stage renal disease: patient seems to have progressed to ESRD from chronic kidney disease with worsening renal function.  - Will need outpatient dialysis planning and getting a new tunneled catheter.  - hemodialysis last on 9/2, UF of 2 litres. Permcath removed on 9/8. - Nonoliguric urine output. Will need stict I&Os.   2. Hypertension: well controlled on furosemide, metoprolol, hydralazine, and isosorbide mononitrate.  3. Anemia of chronic kidney disease:  Hemoglobin 9.5  - will require erythropoietin   4. Secondary Hyperparathyroidism: phos 4.4, calcium 8.8. Not currently on binders.  - check PTH.    LOS: 28 Bryony Kaman 9/11/201610:32 AM

## 2015-07-08 NOTE — Progress Notes (Signed)
Physical Therapy Treatment Patient Details Name: Aaron Harrison MRN: [id number] DOB: Feb 04, 1951 Today's Date: 07/08/2015    History of Present Illness presented to ER secondary to weakness, AMS; admitted (and subsequently intubated) for acute hypercarbic/hypoxic respiratory failure secondary to CHF exacerbation.  Hospital course also significant for liver dysfunction, acute encephalopathy and renal failure (requiring CRRT, ended 8/25; now with R temp fem cath); successfully extubated 8/25, currently on 1L O2 via [place].  Patient noted with transfer to CCU over weekend due to SOB/respiratory distress, now improved and returned to floor; new order received to resume PT services this date.    PT Comments    Pt does much better with PT today and is able to walk ~150 ft, some w/o AD.  He is unsteady and cautious w/o AD and feels he would need a walker at home for safety (PT agrees with this and recommends AD).  Pt has no real LOBs, but was unsteady during ambulation.  He has some fatigue with the effort, but reports feeling much better than he has and is eager to be able to go home and avoid rehab.  He has a friend who can check on him daily for a while and run errands when he first returns home.  Follow Up Recommendations  Home health PT     Equipment Recommendations  Rolling walker with 5" wheels    Recommendations for Other Services       Precautions / Restrictions Precautions Precautions: Fall Restrictions Weight Bearing Restrictions: No    Mobility  Bed Mobility Overal bed mobility: Modified Independent Bed Mobility: Sidelying to Sit;Supine to Sit   Sidelying to sit: Supervision Supine to sit: Supervision     General bed mobility comments: Pt does well with bed mobility and does not need direct physical assist.   Transfers Overall transfer level: Modified independent Equipment used: Rolling walker (2 wheeled) Transfers: Sit to/from Stand Sit to Stand: Supervision          General transfer comment: Pt rises with good confidence, does not need excessive UE use and only minimal cuing for set up  Ambulation/Gait Ambulation/Gait assistance: Min guard Ambulation Distance (Feet): 150 Feet Assistive device: Rolling walker (2 wheeled);None (hallway rail)       General Gait Details: Pt walking slower than his baseline and generally is cautious and guarded but is able to go ~150 ft. He does ~75 ft with walker, ~50 ft with rail and ~25 ft w/o AD.  Pt with no LOBs and though she does fatigue this is not a limiter.   Stairs            Wheelchair Mobility    Modified Rankin (Stroke Patients Only)       Balance                                    Cognition Arousal/Alertness: Awake/alert Behavior During Therapy: WFL for tasks assessed/performed Overall Cognitive Status: Within Functional Limits for tasks assessed                      Exercises General Exercises - Lower Extremity Ankle Circles/Pumps: AAROM;10 reps Long Arc Quad: 10 reps;Both;Strengthening Hip ABduction/ADduction: Strengthening;10 reps;Both Hip Flexion/Marching: Strengthening;10 reps;Both    General Comments        Pertinent Vitals/Pain Pain Assessment: No/denies pain    Home Living  Prior Function            PT Goals (current goals can now be found in the care plan section) Progress towards PT goals: Progressing toward goals    Frequency  Min 2X/week    PT Plan Current plan remains appropriate    Co-evaluation             End of Session Equipment Utilized During Treatment: Gait belt Activity Tolerance: Patient tolerated treatment well Patient left: with bed alarm set     Time: 2355-7322 PT Time Calculation (min) (ACUTE ONLY): 24 min  Charges:  $Gait Training: 8-22 mins $Therapeutic Exercise: 8-22 mins                    G Codes:     Wayne Both, PT, DPT [phone]  Kreg Shropshire 07/08/2015, 2:54  PM

## 2015-07-09 ENCOUNTER — Encounter
Admission: EM | Disposition: A | Discharge status: Home / Self Care | Payer: Self-pay | Source: Home / Self Care | Attending: Internal Medicine

## 2015-07-09 LAB — BASIC METABOLIC PANEL
Anion gap: 13 (ref 5–15)
BUN: 74 mg/dL — ABNORMAL HIGH (ref 6–20)
CHLORIDE: 94 mmol/L — AB (ref 101–111)
CO2: 30 mmol/L (ref 22–32)
CREATININE: 5.16 mg/dL — AB (ref 0.61–1.24)
Calcium: 9.2 mg/dL (ref 8.9–10.3)
GFR calc non Af Amer: 11 mL/min — ABNORMAL LOW (ref >60)
GFR, EST AFRICAN AMERICAN: 13 mL/min — AB (ref >60)
Glucose, Bld: 104 mg/dL — ABNORMAL HIGH (ref 65–99)
POTASSIUM: 4.3 mmol/L (ref 3.5–5.1)
SODIUM: 137 mmol/L (ref 135–145)

## 2015-07-09 SURGERY — DIALYSIS/PERMA CATHETER INSERTION
Anesthesia: Moderate Sedation

## 2015-07-09 MED ORDER — METOPROLOL TARTRATE 25 MG PO TABS
12.5000 mg | ORAL_TABLET | Freq: Two times a day (BID) | ORAL | Status: DC
  Administered 2015-07-09 – 2015-07-10 (×3): 12.5 mg via ORAL
  Filled 2015-07-09 (×5): qty 1

## 2015-07-09 MED ORDER — TAMSULOSIN HCL 0.4 MG PO CAPS
0.4000 mg | ORAL_CAPSULE | Freq: Every day | ORAL | Status: DC
  Administered 2015-07-09 – 2015-07-12 (×4): 0.4 mg via ORAL
  Filled 2015-07-09 (×4): qty 1

## 2015-07-09 MED ORDER — HYDROCORTISONE 1 % EX OINT
TOPICAL_OINTMENT | Freq: Two times a day (BID) | CUTANEOUS | Status: DC
  Administered 2015-07-09 – 2015-07-12 (×6): via TOPICAL
  Filled 2015-07-09: qty 28.35

## 2015-07-09 NOTE — Progress Notes (Signed)
Subjective:   UOP 375 cc  Feels well overall [place] is worse today Patient reports abdominal discomfort   Objective:  Vital signs in last 24 hours:  Temp:  [97.9 F (36.6 C)-98.9 F (37.2 C)] 98.8 F (37.1 C) (09/12 0600) Pulse Rate:  [76-91] 76 (09/12 0600) Resp:  [17-18] 17 (09/12 0600) BP: (124-156)/(59-73) 137/61 mmHg (09/12 0600) SpO2:  [93 %-96 %] 93 % (09/12 0600) Weight:  [99.474 kg (219 lb 4.8 oz)] 99.474 kg (219 lb 4.8 oz) (09/12 0600)  Weight change: 1.043 kg (2 lb 4.8 oz) Filed Weights   07/07/15 0254 07/08/15 0100 07/09/15 0600  Weight: 97.115 kg (214 lb 1.6 oz) 98.431 kg (217 lb) 99.474 kg (219 lb 4.8 oz)    Intake/Output: I/O last 3 completed shifts: In: 480 [P.O.:480] Out: 950 [Urine:950]   Intake/Output this shift:     Physical Exam: General: NAD  Head: Normocephalic, atraumatic.  ENT Moist mucus membranes hearing intact  Neck: Supple, trachea midline  Lungs:  clear  Heart: Irregular S1S2 no rubs  Abdomen:  nontender, mild distension, BS+ , Urinary bladder palpable  Extremities:  trace generalized edema  Neurologic:  resting comfortably   Skin: ulcertaion bilateral lower extremities on shins - in UNNA boots  Access: none    Basic Metabolic Panel:  Recent Labs Lab 07/03/15 1027 07/04/15 0709 07/04/15 0813 07/05/15 1014 07/06/15 0455 07/07/15 0742 07/08/15 0520 07/09/15 0414  NA 130*  134* 139 138 137 136 136 136 137  K 2.4*  2.5* 2.8* 2.9* 3.2* 3.5 3.6 3.9 4.3  CL 86*  90* 91* 91* 90* 91* 93* 93* 94*  CO2 27  33* 37* 35* 32 31 32 32 30  GLUCOSE 125*  132* 87 81 87 91 94 96 104*  BUN 46*  46* 44* 43* 43* 53* 62* 70* 74*  CREATININE 3.91*  3.96* 3.61* 3.44* 3.41* 3.68* 4.34* 4.95* 5.16*  CALCIUM 7.2*  7.7* 7.8* 8.0* 8.1* 8.1* 8.5* 8.8* 9.2  MG 1.4* 1.4*  --   --   --   --   --   --   PHOS 3.8 3.6 3.6 3.7 3.9 4.0 4.4  --     Liver Function Tests:  Recent Labs Lab 07/04/15 0813 07/05/15 1014 07/06/15 0455 07/07/15 0742  07/08/15 0520  ALBUMIN 3.2* 3.3* 3.1* 3.2* 3.3*   No results for input(s): LIPASE, AMYLASE in the last 168 hours. No results for input(s): AMMONIA in the last 168 hours.  CBC:  Recent Labs Lab 07/03/15 1027 07/04/15 0709 07/07/15 0742 07/08/15 0520  WBC 4.9 3.8 4.1 3.7*  HGB 9.8* 9.8* 9.6* 9.5*  HCT 28.9* 28.9* 28.0* 28.0*  MCV 100.6* 99.9 99.6 99.7  PLT 133* 146* 150 141*    Cardiac Enzymes: No results for input(s): CKTOTAL, CKMB, CKMBINDEX, TROPONINI in the last 168 hours.  BNP: Invalid input(s): POCBNP  CBG: No results for input(s): GLUCAP in the last 168 hours.  Microbiology: Results for orders placed or performed during the hospital encounter of 06/10/15  Blood Culture (routine x 2)     Status: None   Collection Time: 06/10/15  2:35 PM  Result Value Ref Range Status   Specimen Description BLOOD RIGHT WRIST  Final   Special Requests BOTTLES DRAWN AEROBIC AND ANAEROBIC  [place]  Final   Culture NO GROWTH 5 DAYS  Final   Report Status 06/15/2015 FINAL  Final  Blood Culture (routine x 2)     Status: None   Collection Time: 06/10/15  2:40 PM  Result Value Ref Range Status   Specimen Description BLOOD LEFT ASSIST CONTROL  Final   Special Requests   Final    BOTTLES DRAWN AEROBIC AND ANAEROBIC  AER 6CC ANA 4CC   Culture NO GROWTH 5 DAYS  Final   Report Status 06/15/2015 FINAL  Final  Urine culture     Status: None   Collection Time: 06/10/15  4:42 PM  Result Value Ref Range Status   Specimen Description URINE, RANDOM  Final   Special Requests NONE  Final   Culture 4,000 COLONIES/mL INSIGNIFICANT GROWTH  Final   Report Status 06/12/2015 FINAL  Final  MRSA PCR Screening     Status: None   Collection Time: 06/10/15  6:27 PM  Result Value Ref Range Status   MRSA by PCR NEGATIVE NEGATIVE Final    Comment:        The GeneXpert MRSA Assay (FDA approved for NASAL specimens only), is one component of a comprehensive MRSA colonization surveillance program. It is  not intended to diagnose MRSA infection nor to guide or monitor treatment for MRSA infections.   Culture, sputum-assessment     Status: None   Collection Time: 06/11/15  2:50 PM  Result Value Ref Range Status   Specimen Description SPUTUM  Final   Special Requests NONE  Final   Sputum evaluation THIS SPECIMEN IS ACCEPTABLE FOR SPUTUM CULTURE  Final   Report Status 06/13/2015 FINAL  Final  Culture, respiratory (NON-Expectorated)     Status: None   Collection Time: 06/11/15  2:50 PM  Result Value Ref Range Status   Specimen Description SPUTUM  Final   Special Requests NONE Reflexed from [id number]  Final   Gram Stain   Final    MODERATE WBC SEEN RARE GRAM POSITIVE COCCI GOOD SPECIMEN - 80-90% WBCS    Culture MODERATE GROWTH STAPHYLOCOCCUS AUREUS  Final   Report Status 06/14/2015 FINAL  Final   Organism ID, Bacteria STAPHYLOCOCCUS AUREUS  Final      Susceptibility   Staphylococcus aureus - MIC*    CIPROFLOXACIN <=0.5 SENSITIVE Sensitive     GENTAMICIN <=0.5 SENSITIVE Sensitive     OXACILLIN 0.5 SENSITIVE Sensitive     TRIMETH/SULFA <=10 SENSITIVE Sensitive     CEFOXITIN SCREEN NEGATIVE Sensitive     Inducible Clindamycin NEGATIVE Sensitive     TETRACYCLINE Value in next row Sensitive      SENSITIVE<=1    * MODERATE GROWTH STAPHYLOCOCCUS AUREUS  Wound culture     Status: None   Collection Time: 06/13/15  1:56 PM  Result Value Ref Range Status   Specimen Description LEG  Final   Special Requests Normal  Final   Gram Stain   Final    FEW WBC SEEN MODERATE GRAM NEGATIVE RODS FEW GRAM POSITIVE COCCI    Culture   Final    LIGHT GROWTH STAPHYLOCOCCUS AUREUS RARE GROWTH PROTEUS PENNERI RARE GROWTH ENTEROCOCCUS FAECALIS    Report Status 06/17/2015 FINAL  Final   Organism ID, Bacteria STAPHYLOCOCCUS AUREUS  Final   Organism ID, Bacteria PROTEUS PENNERI  Final   Organism ID, Bacteria ENTEROCOCCUS FAECALIS  Final      Susceptibility   Staphylococcus aureus - MIC*     CIPROFLOXACIN <=0.5 SENSITIVE Sensitive     GENTAMICIN <=0.5 SENSITIVE Sensitive     OXACILLIN 0.5 SENSITIVE Sensitive     TRIMETH/SULFA <=10 SENSITIVE Sensitive     CEFOXITIN SCREEN NEGATIVE Sensitive     Inducible Clindamycin NEGATIVE Sensitive  TETRACYCLINE Value in next row Sensitive      SENSITIVE<=1    * LIGHT GROWTH STAPHYLOCOCCUS AUREUS   Proteus penneri - MIC*    AMPICILLIN Value in next row Resistant      SENSITIVE<=1    CEFAZOLIN Value in next row Resistant      SENSITIVE<=1    CEFTRIAXONE Value in next row Sensitive      SENSITIVE<=1    CIPROFLOXACIN Value in next row Sensitive      SENSITIVE<=1    GENTAMICIN Value in next row Sensitive      SENSITIVE<=1    IMIPENEM Value in next row Sensitive      SENSITIVE<=1    NITROFURANTOIN Value in next row Resistant      SENSITIVE<=1    TRIMETH/SULFA Value in next row Sensitive      SENSITIVE<=1    PIP/TAZO Value in next row Sensitive      SENSITIVE<=4    * RARE GROWTH PROTEUS PENNERI   Enterococcus faecalis - MIC*    AMPICILLIN Value in next row Sensitive      SENSITIVE<=4    LINEZOLID Value in next row Sensitive      SENSITIVE<=4    * RARE GROWTH ENTEROCOCCUS FAECALIS  Wound culture     Status: None   Collection Time: 06/13/15  1:58 PM  Result Value Ref Range Status   Specimen Description LEG  Final   Special Requests Normal  Final   Gram Stain RARE WBC SEEN FEW GRAM NEGATIVE RODS   Final   Culture NO GROWTH 4 DAYS  Final   Report Status 06/17/2015 FINAL  Final  MRSA PCR Screening     Status: None   Collection Time: 07/01/15  7:40 PM  Result Value Ref Range Status   MRSA by PCR NEGATIVE NEGATIVE Final    Comment:        The GeneXpert MRSA Assay (FDA approved for NASAL specimens only), is one component of a comprehensive MRSA colonization surveillance program. It is not intended to diagnose MRSA infection nor to guide or monitor treatment for MRSA infections.   Cath Tip Culture     Status: None    Collection Time: 07/01/15  9:14 PM  Result Value Ref Range Status   Specimen Description CATH TIP  Final   Special Requests NONE  Final   Culture NO GROWTH 3 DAYS  Final   Report Status 07/04/2015 FINAL  Final    Coagulation Studies: No results for input(s): LABPROT, INR in the last 72 hours.  Urinalysis: No results for input(s): COLORURINE, LABSPEC, PHURINE, GLUCOSEU, HGBUR, BILIRUBINUR, KETONESUR, PROTEINUR, UROBILINOGEN, NITRITE, LEUKOCYTESUR in the last 72 hours.  Invalid input(s): APPERANCEUR    Imaging: No results found.   Medications:     . feeding supplement  1 Container Oral TID WC  . furosemide  40 mg Oral Daily  . heparin subcutaneous  5,000 Units Subcutaneous 3 times per day  . magnesium oxide  400 mg Oral Daily   alteplase, ammonium lactate, anticoagulant sodium citrate, ipratropium-albuterol, lidocaine (PF), lidocaine-prilocaine, ondansetron (ZOFRAN) IV, pentafluoroprop-tetrafluoroeth, senna-docusate  Assessment/ Plan:  64 y.o. male with a PMHx of congestive heart failure ejection fraction 30-35%, prior pneumonia, lower extremity edema with cellulitis, atrial fibrillation who was admitted to [facility] on 06/10/2015 for evaluation of altered mental status and shortness of breath. Upon admission the patient underwent intubation with transition to a mechanical ventilation.  1.  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 His renal function may recover enough to keep him off dialysis  2.  Acute urinary retention - re-insert foley - bladder scan > 1000 cc.  3. Anemia of chronic kidney disease:  Hemoglobin 9.5  - will continue to monitor   4. Secondary Hyperparathyroidism: phos 4.4, calcium 8.8. Not currently on binders.  - check PTH.    LOS: 29 Branden Shallenberger 9/12/20169:53 AM

## 2015-07-09 NOTE — Progress Notes (Signed)
Physical Therapy Treatment Patient Details Name: Aaron Harrison MRN: [id number] DOB: 09-29-1951 Today's Date: 07/09/2015    History of Present Illness presented to ER secondary to weakness, AMS; admitted (and subsequently intubated) for acute hypercarbic/hypoxic respiratory failure secondary to CHF exacerbation.  Hospital course also significant for liver dysfunction, acute encephalopathy and renal failure (requiring CRRT, ended 8/25; now with R temp fem cath); successfully extubated 8/25, currently on 1L O2 via [place].  Patient noted with transfer to CCU over weekend due to SOB/respiratory distress, now improved and returned to floor; new order received to resume PT services this date.    PT Comments    Pt is making good progress towards goals with increased ambulation this date. Pt agreeable to perform stairs next therapy session. Pt able to perform head turns and changing gait speeds during ambulation with safe technique. Pt still demonstrates slow gait speed at this time. Pt safe to ambulate with rw.  Follow Up Recommendations  Home health PT     Equipment Recommendations  Rolling walker with 5" wheels    Recommendations for Other Services       Precautions / Restrictions Precautions Precautions: Fall Restrictions Weight Bearing Restrictions: No    Mobility  Bed Mobility Overal bed mobility: Independent Bed Mobility: Supine to Sit           General bed mobility comments: bed mobility performed with independence.  Transfers Overall transfer level: Modified independent Equipment used: Rolling walker (2 wheeled) Transfers: Sit to/from Stand Sit to Stand: Supervision         General transfer comment: sit<>Stand with rw and safe technique. Cues given for correct hand placement.  Ambulation/Gait Ambulation/Gait assistance: Min guard Ambulation Distance (Feet): 200 Feet Assistive device: Rolling walker (2 wheeled);None Gait Pattern/deviations: Step-through pattern      General Gait Details: Pt ambulated with rw and reciprocal gait pattern. Pt able to perform ambulation with head turns and different gait speeds. Safe technique performed.   Stairs            Wheelchair Mobility    Modified Rankin (Stroke Patients Only)       Balance Overall balance assessment: Needs assistance                                  Cognition Arousal/Alertness: Awake/alert Behavior During Therapy: WFL for tasks assessed/performed Overall Cognitive Status: Within Functional Limits for tasks assessed                      Exercises      General Comments        Pertinent Vitals/Pain Pain Assessment: No/denies pain    Home Living                      Prior Function            PT Goals (current goals can now be found in the care plan section) Acute Rehab PT Goals Patient Stated Goal: to get stronger PT Goal Formulation: With patient Time For Goal Achievement: 07/23/15 Potential to Achieve Goals: Fair Progress towards PT goals: Progressing toward goals    Frequency  Min 2X/week    PT Plan Current plan remains appropriate    Co-evaluation             End of Session Equipment Utilized During Treatment: Gait belt Activity Tolerance: Patient tolerated treatment well Patient left: with  bed alarm set     Time: 1401-1413 PT Time Calculation (min) (ACUTE ONLY): 12 min  Charges:  $Gait Training: 8-22 mins                    G Codes:      Aaron Harrison July 10, 2015, 2:39 PM  Aaron Harrison, PT, DPT [phone]

## 2015-07-09 NOTE — Progress Notes (Signed)
Per MD and PT, pt appropriate for home with [facility]. Per psych MD and attending MD, pt has capacity to make decisions. RNCm aware of [place] needs. SW signing off but available if SNF becomes needed.   Wandra Feinstein, MSW, LCSW [phone] (2c coverage)

## 2015-07-09 NOTE — Care Management (Signed)
PT recommending home health. Spoke with patient concerning discharge plans. Pt stated that he had spoke who his close friend [name] [phone] this weekend and that  He is planning to get a place at either [facility] or at [facility]. Patient stated that he would be getting Medicaid and Social security beginning in October. 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 Patient now has urinary retention and foley. Nephrology now says Acute renal not ESRD. Writer has concerns as to validity of these statements and will contact Mr Rowe Robert concerning. Patient will need disability to qualify for benefits. Voiced my concerns with Dr Andrey Farmer this morning about discharge plan.

## 2015-07-09 NOTE — Care Management Note (Signed)
Case Management Note  Patient Details  Name: Aaron Harrison MRN: [id number] Date of Birth: 1950/12/25  Subjective/Objective:Phone call to [facility] answered , and pt. Situation clarified.  [name] has described that the pt. Parents are deceased. He has never married , and has no children. The only blood    Relative for the pt. Is a cousin , whom he has contact with infrequently. There is no animosity, they are just not close.  [name] confirms he has been looking into a couple of local ALF's. [place] and [facility]. He is just not sure if the pt. Meets criteria for them right now. The pt. Has also talked about being in Mr.Ste[place] home, which [name] says has never happened.  His concern is that he believes the pt is in denial about his prognosis and personal situation. The pt. Has talked to Mr Rowe Robert about the "lawsuit he has him working on." Mr Rowe Robert attests that he is not the type of lawyer to handle any suit of this nature, he is just trying to help [name] set up his affairs so he has someplace to go at the time of discharge.   Mr Rowe Robert is available for any other questions, but is very concerned about what is going to heppen to the pt.    Action/Plan:   Expected Discharge Date:                  Expected Discharge Plan:  Skilled Nursing Facility  In-House Referral:  Clinical Social Work  Discharge planning Services     Post Acute Care Choice:    Choice offered to:     DME Arranged:    DME Agency:     HH Arranged:    [place] Agency:     Status of Service:  In process, will continue to follow  Medicare Important Message Given:    Date Medicare IM Given:    Medicare IM give by:    Date Additional Medicare IM Given:    Additional Medicare Important Message give by:     If discussed at [place] of Stay Meetings, dates discussed:    Additional Comments:  Beau Fanny, RN 07/09/2015, 3:38 PM

## 2015-07-09 NOTE — Consult Note (Signed)
  Psychiatry: Chart reviewed. Did not go by to see the patient again. I will try to keep an eye on it but if there is a reason for me to reconsult or concern about reevaluating his capacity please let me know.

## 2015-07-09 NOTE — Progress Notes (Signed)
[place] [facility] [place] NAME: Aaron Harrison    MR#:  [id number]  DATE OF BIRTH:  November 10, 1950  SUBJECTIVE:   -Serum creatinine much worsened today. Patient unable to void yesterday very well. Belly was distended this morning, bladder scan revealed about a liter of urine. Foley catheter being placed today. -Hold off on permacath and monitor creatinine -Worked better with physical therapy yesterday, recommending home health at this time  REVIEW OF SYSTEMS:   Review of Systems  Constitutional: Negative for fever, chills and weight loss.  HENT: Negative for ear discharge, ear pain and nosebleeds.   Eyes: Negative for blurred vision, pain and discharge.  Respiratory: Negative for sputum production, shortness of breath, wheezing and stridor.   Cardiovascular: Negative for chest pain, palpitations, orthopnea and PND.  Gastrointestinal: Positive for abdominal pain. Negative for nausea, vomiting and diarrhea.  Genitourinary: Negative for urgency and frequency.       Decreased frequency of urination  Musculoskeletal: Negative for back pain and joint pain.  Neurological: Positive for weakness. Negative for sensory change, speech change and focal weakness.  Psychiatric/Behavioral: Negative for depression. The patient is not nervous/anxious.   All other systems reviewed and are negative.  Tolerating Diet:yes Tolerating PT: rec Rehab vs HHPT  DRUG ALLERGIES:   Allergies  Allergen Reactions  . Penicillins Nausea And Vomiting    VITALS:  Blood pressure 137/61, pulse 76, temperature 98.8 F (37.1 C), temperature source Oral, resp. rate 17, height 5\' 8"  (1.727 m), weight 99.474 kg (219 lb 4.8 oz), SpO2 93 %.  PHYSICAL EXAMINATION:   Physical Exam  GENERAL:  64 y.o.-year-old patient lying in the bed with no acute distress. EYES: Pupils equal, round, reactive to light and accommodation. No scleral icterus. Extraocular muscles intact.  HEENT: Head  atraumatic, normocephalic. Oropharynx and nasopharynx clear.  NECK:  Supple, no jugular venous distention. No thyroid enlargement, no tenderness.  LUNGS: Normal breath sounds bilaterally, no wheezing,rhonchi. No use of accessory muscles of respiration. Right basilar crackles heard on exam. CARDIOVASCULAR: S1, S2 normal. No murmurs, rubs, or gallops.  ABDOMEN: Soft, nontender, nondistended. Bowel sounds present. No organomegaly or mass. Abdomen was soft after Foley placement EXTREMITIES: No cyanosis, clubbing or edema b/l.    NEUROLOGIC: Cranial nerves II through XII are intact. No focal Motor or sensory deficits b/l.  Overall weak PSYCHIATRIC: patient is alert and oriented x 3.  SKIN: No obvious rash, lesion, or ulcer.   LABORATORY PANEL:   CBC  Recent Labs Lab 07/08/15 0520  WBC 3.7*  HGB 9.5*  HCT 28.0*  PLT 141*    Chemistries   Recent Labs Lab 07/04/15 0709  07/09/15 0414  NA 139  < > 137  K 2.8*  < > 4.3  CL 91*  < > 94*  CO2 37*  < > 30  GLUCOSE 87  < > 104*  BUN 44*  < > 74*  CREATININE 3.61*  < > 5.16*  CALCIUM 7.8*  < > 9.2  MG 1.4*  --   --   < > = values in this interval not displayed.  ASSESSMENT AND PLAN:   1. Acute respiratory failure with hypoxia-on admission. secondary to fluid overload. clinically improved. Diuresing well with po lasix. Off oxygen now -Patient was intubated, extubated and then reintubated and had a complicated ICU course. -Currently on room air and doing well.  2. Acute on chronic systolic congestive heart failure- rales at the bases.  chest x-ray  consistent with heart failure.  - Continue Lasix daily and POTASSIUM SUPPLEMENTS - Echo with diffuse hypokinesis, ejection fraction of 30-35%. - Metoprolol was held due to hypotension, low dose being restarted today as blood pressure improving.. -Not on any ACE inhibitor due to hypotension and also renal failure  3. Acute renal failure on chronic kidney disease stage III- - Appreciate  nephrology consult. Received temporary dialysis in the hospital and the permacath is taken out.  -Creatinine worsened, likely from acute urinary retention. -Foley catheter being placed, Flomax started. Recheck creatinine. -If his urine output improves and creatinine improves then he might not need dialysis at this point.   4. Essential hypertension- blood pressure within normal limits while medications are on hold. -Hold  Imdur and hydralazine for now -Low-dose metoprolol being restarted today  5. Sepsis- due to pneumonia, bilateral lower extending cellulitis- patient finished antibiotics course here in the hospital. MSSA and enterococcus pneumonia. - f/u CXR with no infiltrate noted -Has been having macular rash on his arms and also legs which is fading from last week. Could be from antibiotics. Monitor for now.  6. Rapid atrial fibrillation on presentation - rate controlled at this time.  - metoprolol on hold due to hypotension.   7. Generalized weakness-consult to physical therapy noted.  - Appreciate Education officer, museum consult. -Has a walker at the present time. -Improving strength, last physical therapy assessment recommending home health  Case discussed with Care Management/Social Worker. Management plans discussed with the patient and  in agreement.  CODE STATUS: DO NOT RESUSCITATE  DVT Prophylaxis: heparin  TOTAL TIME TAKING CARE OF THIS PATIENT: 35 minutes.   POSSIBLE D/C IN next several DAYS, DEPENDING ON CLINICAL CONDITION. Pending recovery of renal function at this time.   Gladstone Lighter M.D on 07/09/2015 at 12:00 PM  Between 7am to 6pm - Pager - [phone]  After 6pm go to [email or web address] - password [email or web address] [facility]  [place] [facility]  Office  [phone]  CC: Primary care physician; No PCP Per Patient

## 2015-07-09 NOTE — Consult Note (Signed)
WOC wound follow up Wound type:Chronic venous stasis disease.  Duke boot (Dry boot) applied for compression. Patient refuses zinc Unnas boots.  Wounds are improved.  No drainage and no edema today.   Measurement:Left anterior calf 2 cm x 0.5 cm scabbed Lesion left posterior 2 cm x 1 cm x 0.1 cm Right anterior calf 4 cm x 2 cm x 0.1 cm 100% pink and moist posterior right 2 cm x 1 cm x 0.2 cm Wound bed:Intact.  No drainage.  No edema.   Drainage (amount, consistency, odor) None Periwound:Intact Dressing procedure/placement/frequency:Bilateral legs are cleansed with soap and water and moisturized with Amlactin cream.  Vaseline gauze to nonintact lesions.  Kerlix wrap from base of toes to just below knee, secured with Coban.  Will change weekly.  Will not follow at this time.  Please re-consult if needed.  Domenic Moras RN BSN [place] Pager [phone]

## 2015-07-10 LAB — RENAL FUNCTION PANEL
ALBUMIN: 3.2 g/dL — AB (ref 3.5–5.0)
ANION GAP: 13 (ref 5–15)
BUN: 70 mg/dL — ABNORMAL HIGH (ref 6–20)
CO2: 28 mmol/L (ref 22–32)
Calcium: 9 mg/dL (ref 8.9–10.3)
Chloride: 97 mmol/L — ABNORMAL LOW (ref 101–111)
Creatinine, Ser: 4.69 mg/dL — ABNORMAL HIGH (ref 0.61–1.24)
GFR calc Af Amer: 14 mL/min — ABNORMAL LOW (ref >60)
GFR, EST NON AFRICAN AMERICAN: 12 mL/min — AB (ref >60)
Glucose, Bld: 100 mg/dL — ABNORMAL HIGH (ref 65–99)
PHOSPHORUS: 5.2 mg/dL — AB (ref 2.5–4.6)
POTASSIUM: 4 mmol/L (ref 3.5–5.1)
Sodium: 138 mmol/L (ref 135–145)

## 2015-07-10 NOTE — Progress Notes (Signed)
Subjective:   UOP 5900 cc after foley cathter was placed S Creatinine has improved to 4.69  Objective:  Vital signs in last 24 hours:  Temp:  [98.5 F (36.9 C)-98.7 F (37.1 C)] 98.7 F (37.1 C) (09/13 0523) Pulse Rate:  [78-99] 78 (09/13 0523) Resp:  [16-17] 16 (09/13 0523) BP: (108-123)/(56-67) 108/57 mmHg (09/13 0523) SpO2:  [93 %-97 %] 93 % (09/13 0523) Weight:  [94.983 kg (209 lb 6.4 oz)] 94.983 kg (209 lb 6.4 oz) (09/13 0500)  Weight change: -4.491 kg (-9 lb 14.4 oz) Filed Weights   07/08/15 0100 07/09/15 0600 07/10/15 0500  Weight: 98.431 kg (217 lb) 99.474 kg (219 lb 4.8 oz) 94.983 kg (209 lb 6.4 oz)    Intake/Output: I/O last 3 completed shifts: In: 9 [P.O.:720] Out: 33 [Urine:5900]   Intake/Output this shift:  Total I/O In: 480 [P.O.:480] Out: 350 [Urine:350]  Physical Exam: General: NAD  Head: Normocephalic, atraumatic.  ENT Moist mucus membranes hearing intact  Neck: Supple, trachea midline  Lungs:  clear  Heart: Irregular S1S2 no rubs  Abdomen:  nontender, mild distension, BS+ ,    Extremities:  trace generalized edema  Neurologic:  resting comfortably   Skin: ulcertaion bilateral lower extremities on shins - in UNNA boots   Foley present    Basic Metabolic Panel:  Recent Labs Lab 07/03/15 1027 07/04/15 0709  07/05/15 1014 07/06/15 0455 07/07/15 0742 07/08/15 0520 07/09/15 0414 07/10/15 0407  NA 130*  134* 139  < > 137 136 136 136 137 138  K 2.4*  2.5* 2.8*  < > 3.2* 3.5 3.6 3.9 4.3 4.0  CL 86*  90* 91*  < > 90* 91* 93* 93* 94* 97*  CO2 27  33* 37*  < > 32 31 32 32 30 28  GLUCOSE 125*  132* 87  < > 87 91 94 96 104* 100*  BUN 46*  46* 44*  < > 43* 53* 62* 70* 74* 70*  CREATININE 3.91*  3.96* 3.61*  < > 3.41* 3.68* 4.34* 4.95* 5.16* 4.69*  CALCIUM 7.2*  7.7* 7.8*  < > 8.1* 8.1* 8.5* 8.8* 9.2 9.0  MG 1.4* 1.4*  --   --   --   --   --   --   --   PHOS 3.8 3.6  < > 3.7 3.9 4.0 4.4  --  5.2*  < > = values in this interval not  displayed.  Liver Function Tests:  Recent Labs Lab 07/05/15 1014 07/06/15 0455 07/07/15 0742 07/08/15 0520 07/10/15 0407  ALBUMIN 3.3* 3.1* 3.2* 3.3* 3.2*   No results for input(s): LIPASE, AMYLASE in the last 168 hours. No results for input(s): AMMONIA in the last 168 hours.  CBC:  Recent Labs Lab 07/03/15 1027 07/04/15 0709 07/07/15 0742 07/08/15 0520  WBC 4.9 3.8 4.1 3.7*  HGB 9.8* 9.8* 9.6* 9.5*  HCT 28.9* 28.9* 28.0* 28.0*  MCV 100.6* 99.9 99.6 99.7  PLT 133* 146* 150 141*    Cardiac Enzymes: No results for input(s): CKTOTAL, CKMB, CKMBINDEX, TROPONINI in the last 168 hours.  BNP: Invalid input(s): POCBNP  CBG: No results for input(s): GLUCAP in the last 168 hours.  Microbiology: Results for orders placed or performed during the hospital encounter of 06/10/15  Blood Culture (routine x 2)     Status: None   Collection Time: 06/10/15  2:35 PM  Result Value Ref Range Status   Specimen Description BLOOD RIGHT WRIST  Final   Special Requests BOTTLES  DRAWN AEROBIC AND ANAEROBIC  1CC  Final   Culture NO GROWTH 5 DAYS  Final   Report Status 06/15/2015 FINAL  Final  Blood Culture (routine x 2)     Status: None   Collection Time: 06/10/15  2:40 PM  Result Value Ref Range Status   Specimen Description BLOOD LEFT ASSIST CONTROL  Final   Special Requests   Final    BOTTLES DRAWN AEROBIC AND ANAEROBIC  AER 6CC ANA 4CC   Culture NO GROWTH 5 DAYS  Final   Report Status 06/15/2015 FINAL  Final  Urine culture     Status: None   Collection Time: 06/10/15  4:42 PM  Result Value Ref Range Status   Specimen Description URINE, RANDOM  Final   Special Requests NONE  Final   Culture 4,000 COLONIES/mL INSIGNIFICANT GROWTH  Final   Report Status 06/12/2015 FINAL  Final  MRSA PCR Screening     Status: None   Collection Time: 06/10/15  6:27 PM  Result Value Ref Range Status   MRSA by PCR NEGATIVE NEGATIVE Final    Comment:        The GeneXpert MRSA Assay (FDA approved  for NASAL specimens only), is one component of a comprehensive MRSA colonization surveillance program. It is not intended to diagnose MRSA infection nor to guide or monitor treatment for MRSA infections.   Culture, sputum-assessment     Status: None   Collection Time: 06/11/15  2:50 PM  Result Value Ref Range Status   Specimen Description SPUTUM  Final   Special Requests NONE  Final   Sputum evaluation THIS SPECIMEN IS ACCEPTABLE FOR SPUTUM CULTURE  Final   Report Status 06/13/2015 FINAL  Final  Culture, respiratory (NON-Expectorated)     Status: None   Collection Time: 06/11/15  2:50 PM  Result Value Ref Range Status   Specimen Description SPUTUM  Final   Special Requests NONE Reflexed from [id number]  Final   Gram Stain   Final    MODERATE WBC SEEN RARE GRAM POSITIVE COCCI GOOD SPECIMEN - 80-90% WBCS    Culture MODERATE GROWTH STAPHYLOCOCCUS AUREUS  Final   Report Status 06/14/2015 FINAL  Final   Organism ID, Bacteria STAPHYLOCOCCUS AUREUS  Final      Susceptibility   Staphylococcus aureus - MIC*    CIPROFLOXACIN <=0.5 SENSITIVE Sensitive     GENTAMICIN <=0.5 SENSITIVE Sensitive     OXACILLIN 0.5 SENSITIVE Sensitive     TRIMETH/SULFA <=10 SENSITIVE Sensitive     CEFOXITIN SCREEN NEGATIVE Sensitive     Inducible Clindamycin NEGATIVE Sensitive     TETRACYCLINE Value in next row Sensitive      SENSITIVE<=1    * MODERATE GROWTH STAPHYLOCOCCUS AUREUS  Wound culture     Status: None   Collection Time: 06/13/15  1:56 PM  Result Value Ref Range Status   Specimen Description LEG  Final   Special Requests Normal  Final   Gram Stain   Final    FEW WBC SEEN MODERATE GRAM NEGATIVE RODS FEW GRAM POSITIVE COCCI    Culture   Final    LIGHT GROWTH STAPHYLOCOCCUS AUREUS RARE GROWTH PROTEUS PENNERI RARE GROWTH ENTEROCOCCUS FAECALIS    Report Status 06/17/2015 FINAL  Final   Organism ID, Bacteria STAPHYLOCOCCUS AUREUS  Final   Organism ID, Bacteria PROTEUS PENNERI  Final    Organism ID, Bacteria ENTEROCOCCUS FAECALIS  Final      Susceptibility   Staphylococcus aureus - MIC*    CIPROFLOXACIN <=0.5  SENSITIVE Sensitive     GENTAMICIN <=0.5 SENSITIVE Sensitive     OXACILLIN 0.5 SENSITIVE Sensitive     TRIMETH/SULFA <=10 SENSITIVE Sensitive     CEFOXITIN SCREEN NEGATIVE Sensitive     Inducible Clindamycin NEGATIVE Sensitive     TETRACYCLINE Value in next row Sensitive      SENSITIVE<=1    * LIGHT GROWTH STAPHYLOCOCCUS AUREUS   Proteus penneri - MIC*    AMPICILLIN Value in next row Resistant      SENSITIVE<=1    CEFAZOLIN Value in next row Resistant      SENSITIVE<=1    CEFTRIAXONE Value in next row Sensitive      SENSITIVE<=1    CIPROFLOXACIN Value in next row Sensitive      SENSITIVE<=1    GENTAMICIN Value in next row Sensitive      SENSITIVE<=1    IMIPENEM Value in next row Sensitive      SENSITIVE<=1    NITROFURANTOIN Value in next row Resistant      SENSITIVE<=1    TRIMETH/SULFA Value in next row Sensitive      SENSITIVE<=1    PIP/TAZO Value in next row Sensitive      SENSITIVE<=4    * RARE GROWTH PROTEUS PENNERI   Enterococcus faecalis - MIC*    AMPICILLIN Value in next row Sensitive      SENSITIVE<=4    LINEZOLID Value in next row Sensitive      SENSITIVE<=4    * RARE GROWTH ENTEROCOCCUS FAECALIS  Wound culture     Status: None   Collection Time: 06/13/15  1:58 PM  Result Value Ref Range Status   Specimen Description LEG  Final   Special Requests Normal  Final   Gram Stain RARE WBC SEEN FEW GRAM NEGATIVE RODS   Final   Culture NO GROWTH 4 DAYS  Final   Report Status 06/17/2015 FINAL  Final  MRSA PCR Screening     Status: None   Collection Time: 07/01/15  7:40 PM  Result Value Ref Range Status   MRSA by PCR NEGATIVE NEGATIVE Final    Comment:        The GeneXpert MRSA Assay (FDA approved for NASAL specimens only), is one component of a comprehensive MRSA colonization surveillance program. It is not intended to diagnose  MRSA infection nor to guide or monitor treatment for MRSA infections.   Cath Tip Culture     Status: None   Collection Time: 07/01/15  9:14 PM  Result Value Ref Range Status   Specimen Description CATH TIP  Final   Special Requests NONE  Final   Culture NO GROWTH 3 DAYS  Final   Report Status 07/04/2015 FINAL  Final    Coagulation Studies: No results for input(s): LABPROT, INR in the last 72 hours.  Urinalysis: No results for input(s): COLORURINE, LABSPEC, PHURINE, GLUCOSEU, HGBUR, BILIRUBINUR, KETONESUR, PROTEINUR, UROBILINOGEN, NITRITE, LEUKOCYTESUR in the last 72 hours.  Invalid input(s): APPERANCEUR    Imaging: No results found.   Medications:     . feeding supplement  1 Container Oral TID WC  . furosemide  40 mg Oral Daily  . heparin subcutaneous  5,000 Units Subcutaneous 3 times per day  . hydrocortisone   Topical BID  . magnesium oxide  400 mg Oral Daily  . metoprolol tartrate  12.5 mg Oral BID  . tamsulosin  0.4 mg Oral Daily   ammonium lactate, ipratropium-albuterol, ondansetron (ZOFRAN) IV, senna-docusate  Assessment/ Plan:  64 y.o. male with  a PMHx of congestive heart failure ejection fraction 30-35%, prior pneumonia, lower extremity edema with cellulitis, atrial fibrillation who was admitted to [facility] on 06/10/2015 for evaluation of altered mental status and shortness of breath. Upon admission the patient underwent intubation with transition to a mechanical ventilation.  1.  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   UOP significantly improved Electrolytes and Volume status are acceptable No acute indication for Dialysis at present   2.  Acute urinary retention - re-insert foley - flomax - urology follow up  3. Anemia of chronic kidney disease:    - will continue to monitor   4. Secondary Hyperparathyroidism:  Not currently on binders.      LOS:  30 Aaron Harrison 9/13/20169:56 AM

## 2015-07-10 NOTE — Progress Notes (Signed)
Physical Therapy Treatment Patient Details Name: Aaron Harrison MRN: [id number] DOB: Oct 09, 1951 Today's Date: 07/10/2015    History of Present Illness presented to ER secondary to weakness, AMS; admitted (and subsequently intubated) for acute hypercarbic/hypoxic respiratory failure secondary to CHF exacerbation.  Hospital course also significant for liver dysfunction, acute encephalopathy and renal failure (requiring CRRT, ended 8/25; now with R temp fem cath); successfully extubated 8/25, currently on 1L O2 via [place].  Patient noted with transfer to CCU over weekend due to SOB/respiratory distress, now improved and returned to floor; new order received to resume PT services this date.    PT Comments    Pt is making good progress towards goals with improved ambulation distance this session using rw. Pt is physically performing well, however has limited capacity initiating movement. Pt able to acknowledge he wants to get OOB, however need cues to breakdown activity from supine->sit->stand. Not safe to be home alone at this time. Needs supervision for all mobility for safety. Pt very motivated to participate in therapy.  Follow Up Recommendations  Supervision/Assistance - 24 hour;Home health PT     Equipment Recommendations  Rolling walker with 5" wheels    Recommendations for Other Services       Precautions / Restrictions Precautions Precautions: Fall Restrictions Weight Bearing Restrictions: No    Mobility  Bed Mobility Overal bed mobility: Independent Bed Mobility: Supine to Sit           General bed mobility comments: pt independent with bed mobility, however requires cues for sequencing.   Transfers Overall transfer level: Modified independent Equipment used: Rolling walker (2 wheeled) Transfers: Sit to/from Stand Sit to Stand: Supervision         General transfer comment: sit<>stand with rw and supervision. Safe technique performed. Once standing, pt required  foley/diaper adjustment. Pt able to maintain standing balance while therapist adjusted undergarments.  Ambulation/Gait Ambulation/Gait assistance: Min guard Ambulation Distance (Feet): 300 Feet Assistive device: Rolling walker (2 wheeled) Gait Pattern/deviations: Step-through pattern     General Gait Details: ambulated using rw and cga. Pt needs cues for sequencing and direction. Stairs attempted, however pt fearful of only having stairs descending so deferred this date.   Stairs            Wheelchair Mobility    Modified Rankin (Stroke Patients Only)       Balance                                    Cognition Arousal/Alertness: Awake/alert Behavior During Therapy: WFL for tasks assessed/performed Overall Cognitive Status: Within Functional Limits for tasks assessed                      Exercises      General Comments        Pertinent Vitals/Pain Pain Assessment: No/denies pain    Home Living                      Prior Function            PT Goals (current goals can now be found in the care plan section) Acute Rehab PT Goals Patient Stated Goal: to get stronger PT Goal Formulation: With patient Time For Goal Achievement: 07/23/15 Potential to Achieve Goals: Fair Progress towards PT goals: Progressing toward goals    Frequency  Min 2X/week    PT Plan Current  plan remains appropriate    Co-evaluation             End of Session Equipment Utilized During Treatment: Gait belt Activity Tolerance: Patient tolerated treatment well Patient left: with bed alarm set     Time: 6283-6629 PT Time Calculation (min) (ACUTE ONLY): 23 min  Charges:  $Gait Training: 23-37 mins                    G Codes:      Brittany Osier 08-06-2015, 3:32 PM Greggory Stallion, PT, DPT [phone]

## 2015-07-10 NOTE — Progress Notes (Signed)
Case was discussed with Nathaniel Man clinical social work Surveyor, quantity. Patient is walking 200 feet with PT and they are recommending home health. Per MD patient has capacity to make decisions for himself. Per Zack patient will have to go home if he has capacity, he is not eligible for a LOG at this time. Per Edwyna Ready if MD documents that patient does not have capacity then a new disposition plan will have to be put in place. Per RN Case Manager he will contact MD to come back and reevaluate patient's capacity. CSW will continue to follow and assist as needed.   Blima Rich, [place] [phone]

## 2015-07-10 NOTE — Consult Note (Signed)
  Psychiatry: Patient seen. Chart reviewed. Case manager asked me to come by and reevaluate the patient. Patient has no new complaints. Tells me that he's been able to get up and walk around the ward a little bit. He tells me that his mood is feeling fine. As far as discharge he tells me that his friend the lawyer is working on a couple of different options and thathe is hoping that some kind of placement can be found. He says that Plan B is to go back home and have his water turned back on. He says he has some acquaintances who will come by to look in on him.  Patient is able to accurately describe his medical problem. He understands the renal issues. He understands the severity of his medical problems.  If I understand correctly, what the case manager is telling me is that no group homes have been identified. Also that no clear disposition has been identified so far. Also that the patient is said to have no money with which he could have his water turned back on and is said to have no one who will look in on him.  Essentially I am being asked to judge the nature of the facts which I have no direct evidence about and the patient's understanding of the facts. Patient tells me that no one has been by to talk with him about his disposition plan in the last day or so. He tells me that he has not heard from his attorney friend in the last couple days. Looked at from the patient's point of view there is clearly no great rush since he is being comfortably cared for in the hospital.  No signs of depression or psychosis. No advancement of dementia. The decision to say that someone lacks capacity to make decisions is a rather profound 1 since it takes away some of their basic human rights. Under the circumstances I have to continue to say that the patient meets the basic criteria of capacity to make decisions. I would suggest that there be a very clear meeting with the patient in which it is laid out in the strongest  possible terms what options are and are not available and what offers can possibly be made. Once that is done and the patient has clearly been presented with those options then he can make a decision. Having capacity does not require that a person have good judgment or make good decisions simply that they understand what the options are.

## 2015-07-10 NOTE — Care Management (Signed)
Discussed case with Dr Weber Cooks concerning re evaluation.Discussed new information that Dr Carlena Hurl may not have been aware of.  Concerns over patient not providing accurate information about his living situation and finances and being able to take care of his own affairs at time of discharge.  Discussed case with Colletta Maryland physical therapy who stated that patient physically does what is asked but requires prompting. PT has concerns about pt going home without supervision. Contacted Dr Tressia Miners to discuss. Dr Weber Cooks agrees to see patient again.

## 2015-07-10 NOTE — Progress Notes (Signed)
[place] [facility] [place] NAME: Aaron Harrison    MR#:  [id number]  DATE OF BIRTH:  26-Feb-1951  SUBJECTIVE:   - Feels much better today. -Serum creatinine much improved after Foley catheter is placed - working well with physical therapy  REVIEW OF SYSTEMS:   Review of Systems  Constitutional: Negative for fever, chills and weight loss.  HENT: Negative for ear discharge, ear pain and nosebleeds.   Eyes: Negative for blurred vision, pain and discharge.  Respiratory: Negative for sputum production, shortness of breath, wheezing and stridor.   Cardiovascular: Negative for chest pain, palpitations, orthopnea and PND.  Gastrointestinal: Negative for nausea, vomiting, abdominal pain and diarrhea.  Genitourinary: Negative for dysuria, urgency and frequency.  Musculoskeletal: Negative for back pain and joint pain.  Neurological: Positive for weakness. Negative for sensory change, speech change and focal weakness.  Psychiatric/Behavioral: Negative for depression. The patient is not nervous/anxious.   All other systems reviewed and are negative.  Tolerating Diet:yes Tolerating PT:  HHPT  DRUG ALLERGIES:   Allergies  Allergen Reactions  . Penicillins Nausea And Vomiting    VITALS:  Blood pressure 108/57, pulse 78, temperature 98.7 F (37.1 C), temperature source Oral, resp. rate 16, height 5\' 8"  (1.727 m), weight 94.983 kg (209 lb 6.4 oz), SpO2 93 %.  PHYSICAL EXAMINATION:   Physical Exam  GENERAL:  64 y.o.-year-old patient lying in the bed with no acute distress. EYES: Pupils equal, round, reactive to light and accommodation. No scleral icterus. Extraocular muscles intact.  HEENT: Head atraumatic, normocephalic. Oropharynx and nasopharynx clear.  NECK:  Supple, no jugular venous distention. No thyroid enlargement, no tenderness.  LUNGS: Normal breath sounds bilaterally, no wheezing,rhonchi. No use of accessory muscles of respiration.  Right basilar crackles heard on exam. CARDIOVASCULAR: S1, S2 normal. No murmurs, rubs, or gallops.  ABDOMEN: Soft, nontender, nondistended. Bowel sounds present. No organomegaly or mass. Abdomen was soft after Foley placement EXTREMITIES: No cyanosis, clubbing or edema b/l.    NEUROLOGIC: Cranial nerves II through XII are intact. No focal Motor or sensory deficits b/l.  Overall weak PSYCHIATRIC: patient is alert and oriented x 3.  SKIN: No obvious rash, lesion, or ulcer.   LABORATORY PANEL:   CBC  Recent Labs Lab 07/08/15 0520  WBC 3.7*  HGB 9.5*  HCT 28.0*  PLT 141*    Chemistries   Recent Labs Lab 07/04/15 0709  07/10/15 0407  NA 139  < > 138  K 2.8*  < > 4.0  CL 91*  < > 97*  CO2 37*  < > 28  GLUCOSE 87  < > 100*  BUN 44*  < > 70*  CREATININE 3.61*  < > 4.69*  CALCIUM 7.8*  < > 9.0  MG 1.4*  --   --   < > = values in this interval not displayed.  ASSESSMENT AND PLAN:   1. Acute respiratory failure with hypoxia-on admission. secondary to fluid overload. clinically improved. Diuresing well with po lasix. Off oxygen now -Patient was intubated, extubated and then reintubated and had a complicated ICU course. -Currently on room air and doing well.  2. Acute on chronic systolic congestive heart failure- rales at the bases.  chest x-ray consistent with heart failure.  - Continue Lasix daily - Echo with diffuse hypokinesis, ejection fraction of 30-35%. - Metoprolol low dose being restarted as blood pressure improving.. -Not on any ACE inhibitor due to hypotension and also renal failure  3.  Acute renal failure on chronic kidney disease stage III-baseline cr around 3.2 - Appreciate nephrology consult. Received temporary dialysis in the hospital and the permacath is taken out.  -Creatinine worsened, likely from acute urinary retention. -Foley catheter being placed, Flomax started. Recheck creatinine. -If his urine output improves and creatinine improves then he might not  need dialysis at this point.   4. Essential hypertension- blood pressure within normal limits while medications are on hold. -Hold  Imdur and hydralazine for now -Low-dose metoprolol being restarted today  5. Sepsis- due to pneumonia, bilateral lower extending cellulitis- patient finished antibiotics course here in the hospital. MSSA and enterococcus pneumonia. - f/u CXR with no infiltrate noted -Has been having macular rash on his arms and also legs which is fading from last week. Could be from antibiotics. Monitor for now.  6. Rapid atrial fibrillation on presentation - rate controlled at this time.  - Low dose metoprolol started   7. Generalized weakness-consult to physical therapy noted.  - Appreciate Education officer, museum consult. -Has a walker at the present time. -Improving strength, last physical therapy assessment recommending home health  Case discussed with Care Management/Social Worker. Management plans discussed with the patient and  in agreement.  CODE STATUS: DO NOT RESUSCITATE  DVT Prophylaxis: heparin  TOTAL TIME TAKING CARE OF THIS PATIENT: 35 minutes.   POSSIBLE D/C IN 1-2 DAYS, DEPENDING ON CLINICAL CONDITION. Pending recovery of renal function at this time.   Gladstone Lighter M.D on 07/10/2015 at 12:27 PM  Between 7am to 6pm - Pager - [phone]  After 6pm go to [email or web address] - password [email or web address] [facility]  [facility] [facility]  Office  [phone]  CC: Primary care physician; No PCP Per Patient

## 2015-07-11 LAB — BASIC METABOLIC PANEL
Anion gap: 12 (ref 5–15)
BUN: 63 mg/dL — AB (ref 6–20)
CO2: 30 mmol/L (ref 22–32)
CREATININE: 3.72 mg/dL — AB (ref 0.61–1.24)
Calcium: 8.7 mg/dL — ABNORMAL LOW (ref 8.9–10.3)
Chloride: 97 mmol/L — ABNORMAL LOW (ref 101–111)
GFR calc Af Amer: 19 mL/min — ABNORMAL LOW (ref >60)
GFR, EST NON AFRICAN AMERICAN: 16 mL/min — AB (ref >60)
Glucose, Bld: 90 mg/dL (ref 65–99)
Potassium: 3.5 mmol/L (ref 3.5–5.1)
SODIUM: 139 mmol/L (ref 135–145)

## 2015-07-11 LAB — PTH, INTACT AND CALCIUM
Calcium, Total (PTH): 8.7 mg/dL (ref 8.6–10.2)
PTH: 75 pg/mL — AB (ref 15–65)

## 2015-07-11 NOTE — Progress Notes (Signed)
Subjective:   UOP  3450 last 24 hours S Creatinine has improved to 3.72 which is getting close to his baseline No acute complaints today States he is able to eat without any  Objective:  Vital signs in last 24 hours:  Temp:  [97.8 F (36.6 C)-98.8 F (37.1 C)] 98.8 F (37.1 C) (09/14 0511) Pulse Rate:  [79-94] 87 (09/14 1043) Resp:  [16-17] 16 (09/14 0511) BP: (106-118)/(55-73) 108/56 mmHg (09/14 1043) SpO2:  [96 %-97 %] 97 % (09/14 0511) Weight:  [94.983 kg (209 lb 6.4 oz)] 94.983 kg (209 lb 6.4 oz) (09/14 0500)  Weight change: 0 kg (0 lb) Filed Weights   07/09/15 0600 07/10/15 0500 07/11/15 0500  Weight: 99.474 kg (219 lb 4.8 oz) 94.983 kg (209 lb 6.4 oz) 94.983 kg (209 lb 6.4 oz)    Intake/Output: I/O last 3 completed shifts: In: 1800 [P.O.:1800] Out: 5902 [Urine:5901; Stool:1]   Intake/Output this shift:  Total I/O In: 480 [P.O.:480] Out: 350 [Urine:350]  Physical Exam: General: NAD  Head: Normocephalic, atraumatic.  ENT Moist mucus membranes hearing intact  Neck: Supple, trachea midline  Lungs:  clear  Heart: Irregular S1S2 no rubs  Abdomen:  nontender, mild distension, BS+ ,    Extremities:  trace generalized edema  Neurologic:  resting comfortably   Skin: ulcertaion bilateral lower extremities on shins - in UNNA boots   Foley present    Basic Metabolic Panel:  Recent Labs Lab 07/05/15 1014 07/06/15 0455 07/07/15 0742 07/08/15 0520 07/09/15 0414 07/10/15 0358 07/10/15 0407 07/11/15 0755  NA 137 136 136 136 137  --  138 139  K 3.2* 3.5 3.6 3.9 4.3  --  4.0 3.5  CL 90* 91* 93* 93* 94*  --  97* 97*  CO2 32 31 32 32 30  --  28 30  GLUCOSE 87 91 94 96 104*  --  100* 90  BUN 43* 53* 62* 70* 74*  --  70* 63*  CREATININE 3.41* 3.68* 4.34* 4.95* 5.16*  --  4.69* 3.72*  CALCIUM 8.1* 8.1* 8.5* 8.8* 9.2 8.7 9.0 8.7*  PHOS 3.7 3.9 4.0 4.4  --   --  5.2*  --     Liver Function Tests:  Recent Labs Lab 07/05/15 1014 07/06/15 0455 07/07/15 0742  07/08/15 0520 07/10/15 0407  ALBUMIN 3.3* 3.1* 3.2* 3.3* 3.2*   No results for input(s): LIPASE, AMYLASE in the last 168 hours. No results for input(s): AMMONIA in the last 168 hours.  CBC:  Recent Labs Lab 07/07/15 0742 07/08/15 0520  WBC 4.1 3.7*  HGB 9.6* 9.5*  HCT 28.0* 28.0*  MCV 99.6 99.7  PLT 150 141*    Cardiac Enzymes: No results for input(s): CKTOTAL, CKMB, CKMBINDEX, TROPONINI in the last 168 hours.  BNP: Invalid input(s): POCBNP  CBG: No results for input(s): GLUCAP in the last 168 hours.  Microbiology: Results for orders placed or performed during the hospital encounter of 06/10/15  Blood Culture (routine x 2)     Status: None   Collection Time: 06/10/15  2:35 PM  Result Value Ref Range Status   Specimen Description BLOOD RIGHT WRIST  Final   Special Requests BOTTLES DRAWN AEROBIC AND ANAEROBIC  [place]  Final   Culture NO GROWTH 5 DAYS  Final   Report Status 06/15/2015 FINAL  Final  Blood Culture (routine x 2)     Status: None   Collection Time: 06/10/15  2:40 PM  Result Value Ref Range Status   Specimen Description  BLOOD LEFT ASSIST CONTROL  Final   Special Requests   Final    BOTTLES DRAWN AEROBIC AND ANAEROBIC  AER 6CC ANA 4CC   Culture NO GROWTH 5 DAYS  Final   Report Status 06/15/2015 FINAL  Final  Urine culture     Status: None   Collection Time: 06/10/15  4:42 PM  Result Value Ref Range Status   Specimen Description URINE, RANDOM  Final   Special Requests NONE  Final   Culture 4,000 COLONIES/mL INSIGNIFICANT GROWTH  Final   Report Status 06/12/2015 FINAL  Final  MRSA PCR Screening     Status: None   Collection Time: 06/10/15  6:27 PM  Result Value Ref Range Status   MRSA by PCR NEGATIVE NEGATIVE Final    Comment:        The GeneXpert MRSA Assay (FDA approved for NASAL specimens only), is one component of a comprehensive MRSA colonization surveillance program. It is not intended to diagnose MRSA infection nor to guide or monitor  treatment for MRSA infections.   Culture, sputum-assessment     Status: None   Collection Time: 06/11/15  2:50 PM  Result Value Ref Range Status   Specimen Description SPUTUM  Final   Special Requests NONE  Final   Sputum evaluation THIS SPECIMEN IS ACCEPTABLE FOR SPUTUM CULTURE  Final   Report Status 06/13/2015 FINAL  Final  Culture, respiratory (NON-Expectorated)     Status: None   Collection Time: 06/11/15  2:50 PM  Result Value Ref Range Status   Specimen Description SPUTUM  Final   Special Requests NONE Reflexed from [id number]  Final   Gram Stain   Final    MODERATE WBC SEEN RARE GRAM POSITIVE COCCI GOOD SPECIMEN - 80-90% WBCS    Culture MODERATE GROWTH STAPHYLOCOCCUS AUREUS  Final   Report Status 06/14/2015 FINAL  Final   Organism ID, Bacteria STAPHYLOCOCCUS AUREUS  Final      Susceptibility   Staphylococcus aureus - MIC*    CIPROFLOXACIN <=0.5 SENSITIVE Sensitive     GENTAMICIN <=0.5 SENSITIVE Sensitive     OXACILLIN 0.5 SENSITIVE Sensitive     TRIMETH/SULFA <=10 SENSITIVE Sensitive     CEFOXITIN SCREEN NEGATIVE Sensitive     Inducible Clindamycin NEGATIVE Sensitive     TETRACYCLINE Value in next row Sensitive      SENSITIVE<=1    * MODERATE GROWTH STAPHYLOCOCCUS AUREUS  Wound culture     Status: None   Collection Time: 06/13/15  1:56 PM  Result Value Ref Range Status   Specimen Description LEG  Final   Special Requests Normal  Final   Gram Stain   Final    FEW WBC SEEN MODERATE GRAM NEGATIVE RODS FEW GRAM POSITIVE COCCI    Culture   Final    LIGHT GROWTH STAPHYLOCOCCUS AUREUS RARE GROWTH PROTEUS PENNERI RARE GROWTH ENTEROCOCCUS FAECALIS    Report Status 06/17/2015 FINAL  Final   Organism ID, Bacteria STAPHYLOCOCCUS AUREUS  Final   Organism ID, Bacteria PROTEUS PENNERI  Final   Organism ID, Bacteria ENTEROCOCCUS FAECALIS  Final      Susceptibility   Staphylococcus aureus - MIC*    CIPROFLOXACIN <=0.5 SENSITIVE Sensitive     GENTAMICIN <=0.5 SENSITIVE  Sensitive     OXACILLIN 0.5 SENSITIVE Sensitive     TRIMETH/SULFA <=10 SENSITIVE Sensitive     CEFOXITIN SCREEN NEGATIVE Sensitive     Inducible Clindamycin NEGATIVE Sensitive     TETRACYCLINE Value in next row Sensitive  SENSITIVE<=1    * LIGHT GROWTH STAPHYLOCOCCUS AUREUS   Proteus penneri - MIC*    AMPICILLIN Value in next row Resistant      SENSITIVE<=1    CEFAZOLIN Value in next row Resistant      SENSITIVE<=1    CEFTRIAXONE Value in next row Sensitive      SENSITIVE<=1    CIPROFLOXACIN Value in next row Sensitive      SENSITIVE<=1    GENTAMICIN Value in next row Sensitive      SENSITIVE<=1    IMIPENEM Value in next row Sensitive      SENSITIVE<=1    NITROFURANTOIN Value in next row Resistant      SENSITIVE<=1    TRIMETH/SULFA Value in next row Sensitive      SENSITIVE<=1    PIP/TAZO Value in next row Sensitive      SENSITIVE<=4    * RARE GROWTH PROTEUS PENNERI   Enterococcus faecalis - MIC*    AMPICILLIN Value in next row Sensitive      SENSITIVE<=4    LINEZOLID Value in next row Sensitive      SENSITIVE<=4    * RARE GROWTH ENTEROCOCCUS FAECALIS  Wound culture     Status: None   Collection Time: 06/13/15  1:58 PM  Result Value Ref Range Status   Specimen Description LEG  Final   Special Requests Normal  Final   Gram Stain RARE WBC SEEN FEW GRAM NEGATIVE RODS   Final   Culture NO GROWTH 4 DAYS  Final   Report Status 06/17/2015 FINAL  Final  MRSA PCR Screening     Status: None   Collection Time: 07/01/15  7:40 PM  Result Value Ref Range Status   MRSA by PCR NEGATIVE NEGATIVE Final    Comment:        The GeneXpert MRSA Assay (FDA approved for NASAL specimens only), is one component of a comprehensive MRSA colonization surveillance program. It is not intended to diagnose MRSA infection nor to guide or monitor treatment for MRSA infections.   Cath Tip Culture     Status: None   Collection Time: 07/01/15  9:14 PM  Result Value Ref Range Status    Specimen Description CATH TIP  Final   Special Requests NONE  Final   Culture NO GROWTH 3 DAYS  Final   Report Status 07/04/2015 FINAL  Final    Coagulation Studies: No results for input(s): LABPROT, INR in the last 72 hours.  Urinalysis: No results for input(s): COLORURINE, LABSPEC, PHURINE, GLUCOSEU, HGBUR, BILIRUBINUR, KETONESUR, PROTEINUR, UROBILINOGEN, NITRITE, LEUKOCYTESUR in the last 72 hours.  Invalid input(s): APPERANCEUR    Imaging: No results found.   Medications:     . feeding supplement  1 Container Oral TID WC  . furosemide  40 mg Oral Daily  . heparin subcutaneous  5,000 Units Subcutaneous 3 times per day  . hydrocortisone   Topical BID  . magnesium oxide  400 mg Oral Daily  . metoprolol tartrate  12.5 mg Oral BID  . tamsulosin  0.4 mg Oral Daily   ammonium lactate, ipratropium-albuterol, ondansetron (ZOFRAN) IV, senna-docusate  Assessment/ Plan:  64 y.o. male with a PMHx of congestive heart failure ejection fraction 30-35%, prior pneumonia, lower extremity edema with cellulitis, atrial fibrillation who was admitted to [facility] on 06/10/2015 for evaluation of altered mental status and shortness of breath. Upon admission the patient underwent intubation with transition to a mechanical ventilation.  1.  ARF on CKD st 4 Baseline Appears  to be 3.4/GFR 18 ARF is likely secondary to Acute urinary retention UOP significantly improved Electrolytes and Volume status are acceptable No acute indication for Dialysis at present  - We'll follow-up as outpatient  2.  Acute urinary retention - Continue Foley - flomax - We will need urology follow up  3. Anemia of chronic kidney disease:    - will continue to monitor   4. Secondary Hyperparathyroidism:  Not currently on binders.      LOS: 31 Kaitlinn Iversen 9/14/201610:53 AM

## 2015-07-11 NOTE — Progress Notes (Signed)
[facility] [place] NAME: Aaron Harrison    MR#:  [id number]  DATE OF BIRTH:  [birth year]  SUBJECTIVE:   - doing well, orthostatic hypotensive this morning while working with physical therapy - renal function improving  REVIEW OF SYSTEMS:   Review of Systems  Constitutional: Negative for fever, chills and weight loss.  HENT: Negative for ear discharge, ear pain and nosebleeds.   Eyes: Negative for blurred vision, pain and discharge.  Respiratory: Negative for sputum production, shortness of breath, wheezing and stridor.   Cardiovascular: Negative for chest pain, palpitations, orthopnea and PND.  Gastrointestinal: Negative for nausea, vomiting, abdominal pain and diarrhea.  Genitourinary: Negative for dysuria, urgency and frequency.  Musculoskeletal: Negative for back pain and joint pain.  Neurological: Positive for weakness. Negative for sensory change, speech change and focal weakness.  Psychiatric/Behavioral: Negative for depression. The patient is not nervous/anxious.   All other systems reviewed and are negative.  Tolerating Diet:yes Tolerating PT:  HHPT  DRUG ALLERGIES:   Allergies  Allergen Reactions  . Penicillins Nausea And Vomiting    VITALS:  Blood pressure 94/54, pulse 91, temperature 98.7 F (37.1 C), temperature source Oral, resp. rate 16, height 5\' 8"  (1.727 m), weight 94.983 kg (209 lb 6.4 oz), SpO2 96 %.  PHYSICAL EXAMINATION:   Physical Exam  GENERAL:  64 y.o.-year-old patient lying in the bed with no acute distress. EYES: Pupils equal, round, reactive to light and accommodation. No scleral icterus. Extraocular muscles intact.  HEENT: Head atraumatic, normocephalic. Oropharynx and nasopharynx clear.  NECK:  Supple, no jugular venous distention. No thyroid enlargement, no tenderness.  LUNGS: Normal breath sounds bilaterally, no wheezing,rhonchi. No use of accessory muscles of respiration. Right basilar  crackles heard on exam. CARDIOVASCULAR: S1, S2 normal. No murmurs, rubs, or gallops.  ABDOMEN: Soft, nontender, nondistended. Bowel sounds present. No organomegaly or mass. Abdomen was soft after Foley placement EXTREMITIES: No cyanosis, clubbing or edema b/l.    NEUROLOGIC: Cranial nerves II through XII are intact. No focal Motor or sensory deficits b/l.  Overall weak PSYCHIATRIC: patient is alert and oriented x 3.  SKIN: No obvious rash, lesion, or ulcer.   LABORATORY PANEL:   CBC  Recent Labs Lab 07/08/15 0520  WBC 3.7*  HGB 9.5*  HCT 28.0*  PLT 141*    Chemistries   Recent Labs Lab 07/11/15 0755  NA 139  K 3.5  CL 97*  CO2 30  GLUCOSE 90  BUN 63*  CREATININE 3.72*  CALCIUM 8.7*    ASSESSMENT AND PLAN:   1. Acute respiratory failure with hypoxia-secondary to CHF - resolved, extubated, off oxygen  2. Acute on chronic systolic congestive heart failure- rales at the bases.  chest x-ray consistent with heart failure.  - Echo with diffuse hypokinesis, ejection fraction of 30-35%. - hold lasix and metoprolol due to hypotension today -Not on any ACE inhibitor due to hypotension and also renal failure  3. Acute renal failure on chronic kidney disease stage III-baseline cr around 3.2 - Appreciate nephrology consult.  -Creatinine worsened, likely from acute urinary retention. -Foley catheter placed, Flomax started. - creatinine improving and close to baseline  4. Essential hypertension- discontinue lasix and metoprolol today - orthostatic hypotension today- check orthostatics   5. Sepsis- due to pneumonia, bilateral lower extending cellulitis- patient finished antibiotics course here in the hospital. MSSA and enterococcus pneumonia. - f/u CXR with no infiltrate noted -Has been having macular rash on his arms  and also legs which is fading from last week. Could be from antibiotics. Monitor for now.  6. Rapid atrial fibrillation on presentation - rate controlled at  this time.  - discontinue metoprolol, as hypotensive today   7. Generalized weakness-consult to physical therapy noted.  - discharge home tomorrow, home health will be arranged  Case discussed with Care Management/Social Worker. Management plans discussed with the patient and  in agreement.  CODE STATUS: DO NOT RESUSCITATE  DVT Prophylaxis: heparin  TOTAL TIME TAKING CARE OF THIS PATIENT: 35 minutes.   POSSIBLE D/C TOMORROW, DEPENDING ON CLINICAL CONDITION.   Gladstone Lighter M.D on 07/11/2015 at 2:43 PM  Between 7am to 6pm - Pager - [phone]  After 6pm go to [email or web address] - password [email or web address] [facility]  [facility]  Office  [phone]  CC: Primary care physician; No PCP Per Patient

## 2015-07-11 NOTE — Care Management Note (Signed)
Patient is currently not requiring dialysis and plans for discharge when it occurs does not include the need for outpatient dilaysis treatments.  I will continue to monitor until discharge incase plans change. [facility] Liaison [phone]

## 2015-07-11 NOTE — Care Management (Addendum)
Appointment made for [facility] on Saturday September 17th at 10:40 with Dr Clide Deutscher.  Patient must call the day prior to confirm. Must bring photo ID, Proof of residency( light bill, water bill) Proof of income (W2, letter of support ect).  Will have a minimum co pay of $25.

## 2015-07-11 NOTE — Clinical Social Work Note (Signed)
Most recent decision by nephrology is that patient will not require dialysis. Patient had improved with PT to the point that he was ambulating 300 ft. Then today, PT worked with him and he was only able to accomplish 69 ft. CSW contacted Surveyor, quantity, Thedore Mins, to see if we could reconsider an LOG for patient and Thedore Mins stated that we could. In addition, I had called Harriet at [employer] to see if they would still take patient if patient was agreeable but had to leave message.   When CSW presented this to the patient shook his head no to the idea of [place] again and stated he is tired of going back and forth with whether or not he should go to rehab or not and because he stated that the RN CM spoke with the [facility] and they are able to pay for his water and electricity that he wishes to return home. He stated that he will have someone to check in on him a few times a day and RN CM is working on plan with [employer] to have HomeCare Providers provide some assistance in the home. Please reconsult CSW if necessary. Shela Leff MSW,LCSW [phone]

## 2015-07-11 NOTE — Progress Notes (Signed)
Physical Therapy Treatment Patient Details Name: Aaron Harrison MRN: [id number] DOB: Apr 10, 1951 Today's Date: 07/11/2015    History of Present Illness presented to ER secondary to weakness, AMS; admitted (and subsequently intubated) for acute hypercarbic/hypoxic respiratory failure secondary to CHF exacerbation.  Hospital course also significant for liver dysfunction, acute encephalopathy and renal failure (requiring CRRT, ended 8/25; now with R temp fem cath); successfully extubated 8/25, currently on 1L O2 via [place].  Patient noted with transfer to CCU over weekend due to SOB/respiratory distress, now improved and returned to floor; new order received to resume PT services yesterday    PT Comments    Pt making gradual progress towards goals today, but is still limited by his activity tolerance, gait impairments  Balance, and orthostatic hypotension (reference ambulation section). Pt is willing to participate in therapy and motivated to improve. Because of the above impairments, home safety concerns, and currently being far removed from his baseline, pt will continue to benefit from skilled PT in order for him to eventually return home safely.   Follow Up Recommendations  SNF     Equipment Recommendations  Rolling walker with 5" wheels    Recommendations for Other Services       Precautions / Restrictions Precautions Precautions: Fall Restrictions Weight Bearing Restrictions: No    Mobility  Bed Mobility Overal bed mobility: Needs Assistance Bed Mobility: Supine to Sit     Supine to sit: Min assist Sit to supine: Min assist   General bed mobility comments: Pt needs assist for trunk control and and supervision for safety. Pt remains impulsive with mobility  Transfers Overall transfer level: Needs assistance Equipment used: Rolling walker (2 wheeled) Transfers: Sit to/from Stand Sit to Stand: Mod assist         General transfer comment: Pt needs assist to get into standing  and also requires cues for hand placement prior to transfers, particularly with reaching back with sitting down. Pt needs cue to take his hands off of his walker otherwise he is at high risk of falling. Sequencing of RW with transfers requires more reinforcement.   Ambulation/Gait Ambulation/Gait assistance: Min assist Ambulation Distance (Feet): 60 Feet (20 and 40 ft) Assistive device: Rolling walker (2 wheeled) Gait Pattern/deviations: Shuffle;Decreased step length - right;Decreased step length - left;Staggering right;Staggering left Gait velocity: decreased Gait velocity interpretation: Below normal speed for age/gender General Gait Details: Pt needs assist for quidance of the RW and occasional assist for balance correction. Pt has minor staggering, and began getting lightheaded today. Pt's O2 sat in sitting was 87% on room air following brief 20 ft ambulation. Pt's BP in sitting was 90/59 and in standing it was 68/47.   Stairs            Wheelchair Mobility    Modified Rankin (Stroke Patients Only)       Balance Overall balance assessment: Needs assistance Sitting-balance support: Bilateral upper extremity supported Sitting balance-Leahy Scale: Fair Sitting balance - Comments: min assist for unsupported sitting balance   Standing balance support: Bilateral upper extremity supported Standing balance-Leahy Scale: Fair                      Cognition Arousal/Alertness: Awake/alert Behavior During Therapy: Impulsive Overall Cognitive Status: Impaired/Different from baseline Area of Impairment: Safety/judgement;Memory;Awareness     Memory: Decreased short-term memory   Safety/Judgement: Decreased awareness of safety Awareness: Intellectual   General Comments: Pt not recalling answers to his own questions. Repeating questions frequently  Exercises      General Comments        Pertinent Vitals/Pain Pain Assessment: No/denies pain    Home Living                       Prior Function            PT Goals (current goals can now be found in the care plan section) Acute Rehab PT Goals Patient Stated Goal: To do some walking PT Goal Formulation: With patient Time For Goal Achievement: 07/23/15 Potential to Achieve Goals: Fair Progress towards PT goals: PT to reassess next treatment    Frequency  Min 2X/week    PT Plan Discharge plan needs to be updated    Co-evaluation             End of Session Equipment Utilized During Treatment: Gait belt Activity Tolerance:  (limited by orthostatic status. Nurse notified ) Patient left: in chair;with chair alarm set;with call bell/phone within reach     Time: 1001-1035 PT Time Calculation (min) (ACUTE ONLY): 34 min  Charges:                       G CodesJanyth Contes 16-Jul-2015, 11:16 AM  Janyth Contes, SPT. [phone]

## 2015-07-11 NOTE — Care Management (Addendum)
Patient will be discharged possibly in the morning. Plan is as follows:  Home with Home Health RN CSW through [place] as charity care.  New PCP appointment with Dr Clide Deutscher at [facility] 1040 on the 17th of September. Patient given appointment slip and instructions and expressed understanding. Home Health aid through [place] providers via charitable foundation.  Electric and Water bills paid up to date by [employer].  Home meds will be on $4 dollar list lasix and flomax for total of $8 dollars which patient stated that he can pay. Also, Walker provided by [facility] at no cost to patient.

## 2015-07-12 LAB — BASIC METABOLIC PANEL
Anion gap: 10 (ref 5–15)
BUN: 54 mg/dL — ABNORMAL HIGH (ref 6–20)
CHLORIDE: 99 mmol/L — AB (ref 101–111)
CO2: 29 mmol/L (ref 22–32)
Calcium: 8.7 mg/dL — ABNORMAL LOW (ref 8.9–10.3)
Creatinine, Ser: 3.04 mg/dL — ABNORMAL HIGH (ref 0.61–1.24)
GFR calc non Af Amer: 21 mL/min — ABNORMAL LOW (ref >60)
GFR, EST AFRICAN AMERICAN: 24 mL/min — AB (ref >60)
Glucose, Bld: 97 mg/dL (ref 65–99)
POTASSIUM: 3.3 mmol/L — AB (ref 3.5–5.1)
SODIUM: 138 mmol/L (ref 135–145)

## 2015-07-12 MED ORDER — FUROSEMIDE 20 MG PO TABS: 20.0000 mg | ORAL_TABLET | Freq: Every day | ORAL | Status: AC

## 2015-07-12 MED ORDER — TAMSULOSIN HCL 0.4 MG PO CAPS: 0.4000 mg | ORAL_CAPSULE | Freq: Every day | ORAL | Status: AC

## 2015-07-12 MED ORDER — POTASSIUM CHLORIDE CRYS ER 20 MEQ PO TBCR
40.0000 meq | EXTENDED_RELEASE_TABLET | Freq: Once | ORAL | Status: AC
  Administered 2015-07-12: 40 meq via ORAL
  Filled 2015-07-12: qty 2

## 2015-07-12 MED ORDER — POTASSIUM CHLORIDE ER 10 MEQ PO TBCR
10.0000 meq | EXTENDED_RELEASE_TABLET | Freq: Every day | ORAL | Status: DC
Start: 2015-07-12 — End: 2018-09-05

## 2015-07-12 NOTE — Progress Notes (Signed)
Subjective:   UOP  2350 last 24 hours S Creatinine has improved to 3.04 which is getting close to his baseline No acute complaints today   Objective:  Vital signs in last 24 hours:  Temp:  [98.1 F (36.7 C)-98.7 F (37.1 C)] 98.3 F (36.8 C) (09/15 0550) Pulse Rate:  [64-112] 64 (09/15 0902) Resp:  [14-18] 14 (09/15 0550) BP: (94-146)/(54-80) 120/80 mmHg (09/15 0907) SpO2:  [88 %-99 %] 97 % (09/15 0550) Weight:  [91.445 kg (201 lb 9.6 oz)-91.491 kg (201 lb 11.2 oz)] 91.445 kg (201 lb 9.6 oz) (09/15 0500)  Weight change: -3.493 kg (-7 lb 11.2 oz) Filed Weights   07/11/15 0500 07/12/15 0049 07/12/15 0500  Weight: 94.983 kg (209 lb 6.4 oz) 91.491 kg (201 lb 11.2 oz) 91.445 kg (201 lb 9.6 oz)    Intake/Output: I/O last 3 completed shifts: In: 1180 [P.O.:1180] Out: 4350 [Urine:4350]   Intake/Output this shift:  Total I/O In: 240 [P.O.:240] Out: 325 [Urine:325]  Physical Exam: General: NAD  Head: Normocephalic, atraumatic.  ENT Moist mucus membranes hearing intact  Neck: Supple, trachea midline  Lungs:  clear  Heart: Irregular S1S2 no rubs  Abdomen:  nontender, mild distension, BS+ ,    Extremities:  trace generalized edema  Neurologic:  resting comfortably   Skin: ulcertaion bilateral lower extremities on shins - in UNNA boots   Foley present    Basic Metabolic Panel:  Recent Labs Lab 07/06/15 0455 07/07/15 0742 07/08/15 0520 07/09/15 0414  07/10/15 0407 07/11/15 0755 07/12/15 0550  NA 136 136 136 137  --  138 139 138  K 3.5 3.6 3.9 4.3  --  4.0 3.5 3.3*  CL 91* 93* 93* 94*  --  97* 97* 99*  CO2 31 32 32 30  --  28 30 29   GLUCOSE 91 94 96 104*  --  100* 90 97  BUN 53* 62* 70* 74*  --  70* 63* 54*  CREATININE 3.68* 4.34* 4.95* 5.16*  --  4.69* 3.72* 3.04*  CALCIUM 8.1* 8.5* 8.8* 9.2  < > 9.0 8.7* 8.7*  PHOS 3.9 4.0 4.4  --   --  5.2*  --   --   < > = values in this interval not displayed.  Liver Function Tests:  Recent Labs Lab 07/06/15 0455  07/07/15 0742 07/08/15 0520 07/10/15 0407  ALBUMIN 3.1* 3.2* 3.3* 3.2*   No results for input(s): LIPASE, AMYLASE in the last 168 hours. No results for input(s): AMMONIA in the last 168 hours.  CBC:  Recent Labs Lab 07/07/15 0742 07/08/15 0520  WBC 4.1 3.7*  HGB 9.6* 9.5*  HCT 28.0* 28.0*  MCV 99.6 99.7  PLT 150 141*    Cardiac Enzymes: No results for input(s): CKTOTAL, CKMB, CKMBINDEX, TROPONINI in the last 168 hours.  BNP: Invalid input(s): POCBNP  CBG: No results for input(s): GLUCAP in the last 168 hours.  Microbiology: Results for orders placed or performed during the hospital encounter of 06/10/15  Blood Culture (routine x 2)     Status: None   Collection Time: 06/10/15  2:35 PM  Result Value Ref Range Status   Specimen Description BLOOD RIGHT WRIST  Final   Special Requests BOTTLES DRAWN AEROBIC AND ANAEROBIC  [place]  Final   Culture NO GROWTH 5 DAYS  Final   Report Status 06/15/2015 FINAL  Final  Blood Culture (routine x 2)     Status: None   Collection Time: 06/10/15  2:40 PM  Result Value  Ref Range Status   Specimen Description BLOOD LEFT ASSIST CONTROL  Final   Special Requests   Final    BOTTLES DRAWN AEROBIC AND ANAEROBIC  AER 6CC ANA 4CC   Culture NO GROWTH 5 DAYS  Final   Report Status 06/15/2015 FINAL  Final  Urine culture     Status: None   Collection Time: 06/10/15  4:42 PM  Result Value Ref Range Status   Specimen Description URINE, RANDOM  Final   Special Requests NONE  Final   Culture 4,000 COLONIES/mL INSIGNIFICANT GROWTH  Final   Report Status 06/12/2015 FINAL  Final  MRSA PCR Screening     Status: None   Collection Time: 06/10/15  6:27 PM  Result Value Ref Range Status   MRSA by PCR NEGATIVE NEGATIVE Final    Comment:        The GeneXpert MRSA Assay (FDA approved for NASAL specimens only), is one component of a comprehensive MRSA colonization surveillance program. It is not intended to diagnose MRSA infection nor to guide  or monitor treatment for MRSA infections.   Culture, sputum-assessment     Status: None   Collection Time: 06/11/15  2:50 PM  Result Value Ref Range Status   Specimen Description SPUTUM  Final   Special Requests NONE  Final   Sputum evaluation THIS SPECIMEN IS ACCEPTABLE FOR SPUTUM CULTURE  Final   Report Status 06/13/2015 FINAL  Final  Culture, respiratory (NON-Expectorated)     Status: None   Collection Time: 06/11/15  2:50 PM  Result Value Ref Range Status   Specimen Description SPUTUM  Final   Special Requests NONE Reflexed from [id number]  Final   Gram Stain   Final    MODERATE WBC SEEN RARE GRAM POSITIVE COCCI GOOD SPECIMEN - 80-90% WBCS    Culture MODERATE GROWTH STAPHYLOCOCCUS AUREUS  Final   Report Status 06/14/2015 FINAL  Final   Organism ID, Bacteria STAPHYLOCOCCUS AUREUS  Final      Susceptibility   Staphylococcus aureus - MIC*    CIPROFLOXACIN <=0.5 SENSITIVE Sensitive     GENTAMICIN <=0.5 SENSITIVE Sensitive     OXACILLIN 0.5 SENSITIVE Sensitive     TRIMETH/SULFA <=10 SENSITIVE Sensitive     CEFOXITIN SCREEN NEGATIVE Sensitive     Inducible Clindamycin NEGATIVE Sensitive     TETRACYCLINE Value in next row Sensitive      SENSITIVE<=1    * MODERATE GROWTH STAPHYLOCOCCUS AUREUS  Wound culture     Status: None   Collection Time: 06/13/15  1:56 PM  Result Value Ref Range Status   Specimen Description LEG  Final   Special Requests Normal  Final   Gram Stain   Final    FEW WBC SEEN MODERATE GRAM NEGATIVE RODS FEW GRAM POSITIVE COCCI    Culture   Final    LIGHT GROWTH STAPHYLOCOCCUS AUREUS RARE GROWTH PROTEUS PENNERI RARE GROWTH ENTEROCOCCUS FAECALIS    Report Status 06/17/2015 FINAL  Final   Organism ID, Bacteria STAPHYLOCOCCUS AUREUS  Final   Organism ID, Bacteria PROTEUS PENNERI  Final   Organism ID, Bacteria ENTEROCOCCUS FAECALIS  Final      Susceptibility   Staphylococcus aureus - MIC*    CIPROFLOXACIN <=0.5 SENSITIVE Sensitive     GENTAMICIN <=0.5  SENSITIVE Sensitive     OXACILLIN 0.5 SENSITIVE Sensitive     TRIMETH/SULFA <=10 SENSITIVE Sensitive     CEFOXITIN SCREEN NEGATIVE Sensitive     Inducible Clindamycin NEGATIVE Sensitive     TETRACYCLINE Value  in next row Sensitive      SENSITIVE<=1    * LIGHT GROWTH STAPHYLOCOCCUS AUREUS   Proteus penneri - MIC*    AMPICILLIN Value in next row Resistant      SENSITIVE<=1    CEFAZOLIN Value in next row Resistant      SENSITIVE<=1    CEFTRIAXONE Value in next row Sensitive      SENSITIVE<=1    CIPROFLOXACIN Value in next row Sensitive      SENSITIVE<=1    GENTAMICIN Value in next row Sensitive      SENSITIVE<=1    IMIPENEM Value in next row Sensitive      SENSITIVE<=1    NITROFURANTOIN Value in next row Resistant      SENSITIVE<=1    TRIMETH/SULFA Value in next row Sensitive      SENSITIVE<=1    PIP/TAZO Value in next row Sensitive      SENSITIVE<=4    * RARE GROWTH PROTEUS PENNERI   Enterococcus faecalis - MIC*    AMPICILLIN Value in next row Sensitive      SENSITIVE<=4    LINEZOLID Value in next row Sensitive      SENSITIVE<=4    * RARE GROWTH ENTEROCOCCUS FAECALIS  Wound culture     Status: None   Collection Time: 06/13/15  1:58 PM  Result Value Ref Range Status   Specimen Description LEG  Final   Special Requests Normal  Final   Gram Stain RARE WBC SEEN FEW GRAM NEGATIVE RODS   Final   Culture NO GROWTH 4 DAYS  Final   Report Status 06/17/2015 FINAL  Final  MRSA PCR Screening     Status: None   Collection Time: 07/01/15  7:40 PM  Result Value Ref Range Status   MRSA by PCR NEGATIVE NEGATIVE Final    Comment:        The GeneXpert MRSA Assay (FDA approved for NASAL specimens only), is one component of a comprehensive MRSA colonization surveillance program. It is not intended to diagnose MRSA infection nor to guide or monitor treatment for MRSA infections.   Cath Tip Culture     Status: None   Collection Time: 07/01/15  9:14 PM  Result Value Ref Range  Status   Specimen Description CATH TIP  Final   Special Requests NONE  Final   Culture NO GROWTH 3 DAYS  Final   Report Status 07/04/2015 FINAL  Final    Coagulation Studies: No results for input(s): LABPROT, INR in the last 72 hours.  Urinalysis: No results for input(s): COLORURINE, LABSPEC, PHURINE, GLUCOSEU, HGBUR, BILIRUBINUR, KETONESUR, PROTEINUR, UROBILINOGEN, NITRITE, LEUKOCYTESUR in the last 72 hours.  Invalid input(s): APPERANCEUR    Imaging: No results found.   Medications:     . feeding supplement  1 Container Oral TID WC  . heparin subcutaneous  5,000 Units Subcutaneous 3 times per day  . hydrocortisone   Topical BID  . magnesium oxide  400 mg Oral Daily  . potassium chloride  40 mEq Oral Once  . tamsulosin  0.4 mg Oral Daily   ammonium lactate, ipratropium-albuterol, ondansetron (ZOFRAN) IV, senna-docusate  Assessment/ Plan:  64 y.o. male with a PMHx of congestive heart failure ejection fraction 30-35%, prior pneumonia, lower extremity edema with cellulitis, atrial fibrillation who was admitted to [facility] on 06/10/2015 for evaluation of altered mental status and shortness of breath. Upon admission the patient underwent intubation with transition to a mechanical ventilation.  1.  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  Acute urinary retention - Continue Foley - flomax - We will need UROLOGY follow up  3. Anemia of chronic kidney disease:    - will continue to monitor   4. Secondary Hyperparathyroidism:  Not currently on binders.      LOS: 64 Shelah Heatley 9/15/201611:25 AM

## 2015-07-12 NOTE — Discharge Summary (Signed)
[place] [facility] [place] NAME: Aaron Harrison    MR#:  [id number]  DATE OF BIRTH:  09-16-1951  DATE OF ADMISSION:  06/10/2015 ADMITTING PHYSICIAN: Demetrios Loll, MD  DATE OF DISCHARGE: 07/12/2015  PRIMARY CARE PHYSICIAN: No PCP Per Patient    ADMISSION DIAGNOSIS:  Healthcare-associated pneumonia [J18.9] Cellulitis of left lower extremity [L03.116] Cellulitis of right lower extremity [L03.115] Elevated troponin I level [R79.89] Atrial fibrillation, chronic [I48.2] Bilateral edema of lower extremity [R60.0] Multi-organ failure with liver failure [K72.90] Sepsis, due to unspecified organism [A41.9] Acute respiratory failure with hypoxemia [J96.01]  DISCHARGE DIAGNOSIS:  Principal Problem:   Mild cognitive impairment Active Problems:   Acute respiratory failure   Community acquired pneumonia   Acute renal failure   Acute CHF   Hyperkalemia   Ascites   Bilateral lower leg cellulitis   Atrial fibrillation   Sepsis   Coagulopathy   Acute respiratory failure with hypoxemia   Multi-organ failure with liver failure   Abdominal distention   Cellulitis of left lower extremity   Endotracheally intubated   Healthcare-associated pneumonia   Liver disease   Blood poisoning   NSVT (nonsustained ventricular tachycardia)   SECONDARY DIAGNOSIS:   Past Medical History  Diagnosis Date  . Chronic atrial fibrillation     a. not on long term anticoagulation  . Pneumonia 2011  . Chronic systolic CHF (congestive heart failure)   . Cellulitis     HOSPITAL COURSE:   1. Acute respiratory failure with hypoxia-secondary to CHF - resolved, extubated, off oxygen  2. Acute on chronic systolic congestive heart failure- rales at the bases. chest x-ray consistent with heart failure.  - Echo with diffuse hypokinesis, ejection fraction of 30-35%. - hold metoprolol due to hypotension, low dose lasix at discharge -Not on any ACE inhibitor due to  hypotension and also renal failure  3. Acute renal failure on chronic kidney disease stage III - Appreciate nephrology consult.  -Creatinine worsened, likely from acute urinary retention. -Foley catheter placed, Flomax started. - creatinine improving and close to baseline - foley for another 10 days ad the voiding trial  4. Essential hypertension- orthostatics improved, was hypotensive earlier - restart low dose lasix  5. Sepsis- due to pneumonia, bilateral lower extending cellulitis- patient finished antibiotics course here in the hospital. MSSA and enterococcus pneumonia. - f/u CXR with no infiltrate noted -Has been having macular rash on his arms and also legs which is fading from last week. Could be from antibiotics. Monitor for now.  6. Rapid atrial fibrillation on presentation - only while in ICU. - sinus now.  - discontinue metoprolol, as hypotensive  7. Generalized weakness-consult to physical therapy noted.  - discharge home today, home health arranged  DISCHARGE CONDITIONS:   Stable  CONSULTS OBTAINED:  Treatment Team:  Anthonette Legato, MD Adrian Prows, MD Algernon Huxley, MD Nicholes Mango, MD Gonzella Lex, MD  DRUG ALLERGIES:   Allergies  Allergen Reactions  . Penicillins Nausea And Vomiting    DISCHARGE MEDICATIONS:   Current Discharge Medication List    START taking these medications   Details  tamsulosin (FLOMAX) 0.4 MG CAPS capsule Take 1 capsule (0.4 mg total) by mouth daily. Qty: 30 capsule, Refills: 2      CONTINUE these medications which have CHANGED   Details  furosemide (LASIX) 20 MG tablet Take 1 tablet (20 mg total) by mouth daily. Qty: 30 tablet, Refills: 2      CONTINUE these  medications which have NOT CHANGED   Details  aspirin EC 81 MG tablet Take 81 mg by mouth daily.         DISCHARGE INSTRUCTIONS:   1. PCP f/u in 1 week 2. Nephrology f/u in 2 weeks 3. Continue foley catheter for another 10 days and remove for a  voiding trial. 4. Home health  If you experience worsening of your admission symptoms, develop shortness of breath, life threatening emergency, suicidal or homicidal thoughts you must seek medical attention immediately by calling 911 or calling your MD immediately  if symptoms less severe.  You Must read complete instructions/literature along with all the possible adverse reactions/side effects for all the Medicines you take and that have been prescribed to you. Take any new Medicines after you have completely understood and accept all the possible adverse reactions/side effects.   Please note  You were cared for by a hospitalist during your hospital stay. If you have any questions about your discharge medications or the care you received while you were in the hospital after you are discharged, you can call the unit and asked to speak with the hospitalist on call if the hospitalist that took care of you is not available. Once you are discharged, your primary care physician will handle any further medical issues. Please note that NO REFILLS for any discharge medications will be authorized once you are discharged, as it is imperative that you return to your primary care physician (or establish a relationship with a primary care physician if you do not have one) for your aftercare needs so that they can reassess your need for medications and monitor your lab values.    Today   CHIEF COMPLAINT:   Chief Complaint  Patient presents with  . Weakness    "not acting right" per family    VITAL SIGNS:  Blood pressure 120/80, pulse 64, temperature 98.3 F (36.8 C), temperature source Oral, resp. rate 14, height 5\' 8"  (1.727 m), weight 91.445 kg (201 lb 9.6 oz), SpO2 97 %.  I/O:   Intake/Output Summary (Last 24 hours) at 07/12/15 1045 Last data filed at 07/12/15 0914  Gross per 24 hour  Intake    940 ml  Output   2325 ml  Net  -1385 ml    PHYSICAL EXAMINATION:   Physical Exam  GENERAL:  64 y.o.-year-old patient lying in the bed with no acute distress. EYES: Pupils equal, round, reactive to light and accommodation. No scleral icterus. Extraocular muscles intact.  HEENT: Head atraumatic, normocephalic. Oropharynx and nasopharynx clear.  NECK: Supple, no jugular venous distention. No thyroid enlargement, no tenderness.  LUNGS: Normal breath sounds bilaterally, no wheezing,rhonchi. No use of accessory muscles of respiration. No crackles on exam. CARDIOVASCULAR: S1, S2 normal. No murmurs, rubs, or gallops.  ABDOMEN: Soft, nontender, nondistended. Bowel sounds present. No organomegaly or mass. Abdomen is soft. Has a foley catheter. EXTREMITIES: No cyanosis, clubbing or edema b/l.  NEUROLOGIC: Cranial nerves II through XII are intact. No focal Motor or sensory deficits b/l. Overall weak PSYCHIATRIC: patient is alert and oriented x 3.  SKIN: No obvious rash, lesion, or ulcer.   DATA REVIEW:   CBC  Recent Labs Lab 07/08/15 0520  WBC 3.7*  HGB 9.5*  HCT 28.0*  PLT 141*    Chemistries   Recent Labs Lab 07/12/15 0550  NA 138  K 3.3*  CL 99*  CO2 29  GLUCOSE 97  BUN 54*  CREATININE 3.04*  CALCIUM 8.7*  Cardiac Enzymes No results for input(s): TROPONINI in the last 168 hours.  Microbiology Results  Results for orders placed or performed during the hospital encounter of 06/10/15  Blood Culture (routine x 2)     Status: None   Collection Time: 06/10/15  2:35 PM  Result Value Ref Range Status   Specimen Description BLOOD RIGHT WRIST  Final   Special Requests BOTTLES DRAWN AEROBIC AND ANAEROBIC  [place]  Final   Culture NO GROWTH 5 DAYS  Final   Report Status 06/15/2015 FINAL  Final  Blood Culture (routine x 2)     Status: None   Collection Time: 06/10/15  2:40 PM  Result Value Ref Range Status   Specimen Description BLOOD LEFT ASSIST CONTROL  Final   Special Requests   Final    BOTTLES DRAWN AEROBIC AND ANAEROBIC  AER 6CC ANA 4CC   Culture NO GROWTH  5 DAYS  Final   Report Status 06/15/2015 FINAL  Final  Urine culture     Status: None   Collection Time: 06/10/15  4:42 PM  Result Value Ref Range Status   Specimen Description URINE, RANDOM  Final   Special Requests NONE  Final   Culture 4,000 COLONIES/mL INSIGNIFICANT GROWTH  Final   Report Status 06/12/2015 FINAL  Final  MRSA PCR Screening     Status: None   Collection Time: 06/10/15  6:27 PM  Result Value Ref Range Status   MRSA by PCR NEGATIVE NEGATIVE Final    Comment:        The GeneXpert MRSA Assay (FDA approved for NASAL specimens only), is one component of a comprehensive MRSA colonization surveillance program. It is not intended to diagnose MRSA infection nor to guide or monitor treatment for MRSA infections.   Culture, sputum-assessment     Status: None   Collection Time: 06/11/15  2:50 PM  Result Value Ref Range Status   Specimen Description SPUTUM  Final   Special Requests NONE  Final   Sputum evaluation THIS SPECIMEN IS ACCEPTABLE FOR SPUTUM CULTURE  Final   Report Status 06/13/2015 FINAL  Final  Culture, respiratory (NON-Expectorated)     Status: None   Collection Time: 06/11/15  2:50 PM  Result Value Ref Range Status   Specimen Description SPUTUM  Final   Special Requests NONE Reflexed from [id number]  Final   Gram Stain   Final    MODERATE WBC SEEN RARE GRAM POSITIVE COCCI GOOD SPECIMEN - 80-90% WBCS    Culture MODERATE GROWTH STAPHYLOCOCCUS AUREUS  Final   Report Status 06/14/2015 FINAL  Final   Organism ID, Bacteria STAPHYLOCOCCUS AUREUS  Final      Susceptibility   Staphylococcus aureus - MIC*    CIPROFLOXACIN <=0.5 SENSITIVE Sensitive     GENTAMICIN <=0.5 SENSITIVE Sensitive     OXACILLIN 0.5 SENSITIVE Sensitive     TRIMETH/SULFA <=10 SENSITIVE Sensitive     CEFOXITIN SCREEN NEGATIVE Sensitive     Inducible Clindamycin NEGATIVE Sensitive     TETRACYCLINE Value in next row Sensitive      SENSITIVE<=1    * MODERATE GROWTH STAPHYLOCOCCUS AUREUS   Wound culture     Status: None   Collection Time: 06/13/15  1:56 PM  Result Value Ref Range Status   Specimen Description LEG  Final   Special Requests Normal  Final   Gram Stain   Final    FEW WBC SEEN MODERATE GRAM NEGATIVE RODS FEW GRAM POSITIVE COCCI    Culture   Final  LIGHT GROWTH STAPHYLOCOCCUS AUREUS RARE GROWTH PROTEUS PENNERI RARE GROWTH ENTEROCOCCUS FAECALIS    Report Status 06/17/2015 FINAL  Final   Organism ID, Bacteria STAPHYLOCOCCUS AUREUS  Final   Organism ID, Bacteria PROTEUS PENNERI  Final   Organism ID, Bacteria ENTEROCOCCUS FAECALIS  Final      Susceptibility   Staphylococcus aureus - MIC*    CIPROFLOXACIN <=0.5 SENSITIVE Sensitive     GENTAMICIN <=0.5 SENSITIVE Sensitive     OXACILLIN 0.5 SENSITIVE Sensitive     TRIMETH/SULFA <=10 SENSITIVE Sensitive     CEFOXITIN SCREEN NEGATIVE Sensitive     Inducible Clindamycin NEGATIVE Sensitive     TETRACYCLINE Value in next row Sensitive      SENSITIVE<=1    * LIGHT GROWTH STAPHYLOCOCCUS AUREUS   Proteus penneri - MIC*    AMPICILLIN Value in next row Resistant      SENSITIVE<=1    CEFAZOLIN Value in next row Resistant      SENSITIVE<=1    CEFTRIAXONE Value in next row Sensitive      SENSITIVE<=1    CIPROFLOXACIN Value in next row Sensitive      SENSITIVE<=1    GENTAMICIN Value in next row Sensitive      SENSITIVE<=1    IMIPENEM Value in next row Sensitive      SENSITIVE<=1    NITROFURANTOIN Value in next row Resistant      SENSITIVE<=1    TRIMETH/SULFA Value in next row Sensitive      SENSITIVE<=1    PIP/TAZO Value in next row Sensitive      SENSITIVE<=4    * RARE GROWTH PROTEUS PENNERI   Enterococcus faecalis - MIC*    AMPICILLIN Value in next row Sensitive      SENSITIVE<=4    LINEZOLID Value in next row Sensitive      SENSITIVE<=4    * RARE GROWTH ENTEROCOCCUS FAECALIS  Wound culture     Status: None   Collection Time: 06/13/15  1:58 PM  Result Value Ref Range Status   Specimen Description  LEG  Final   Special Requests Normal  Final   Gram Stain RARE WBC SEEN FEW GRAM NEGATIVE RODS   Final   Culture NO GROWTH 4 DAYS  Final   Report Status 06/17/2015 FINAL  Final  MRSA PCR Screening     Status: None   Collection Time: 07/01/15  7:40 PM  Result Value Ref Range Status   MRSA by PCR NEGATIVE NEGATIVE Final    Comment:        The GeneXpert MRSA Assay (FDA approved for NASAL specimens only), is one component of a comprehensive MRSA colonization surveillance program. It is not intended to diagnose MRSA infection nor to guide or monitor treatment for MRSA infections.   Cath Tip Culture     Status: None   Collection Time: 07/01/15  9:14 PM  Result Value Ref Range Status   Specimen Description CATH TIP  Final   Special Requests NONE  Final   Culture NO GROWTH 3 DAYS  Final   Report Status 07/04/2015 FINAL  Final    RADIOLOGY:  No results found.  EKG:   Orders placed or performed during the hospital encounter of 06/10/15  . EKG 12-Lead  . EKG 12-Lead  . EKG 12-Lead  . EKG 12-Lead      Management plans discussed with the patient, family and they are in agreement.  CODE STATUS:     Code Status Orders  Start     Ordered   06/21/15 1714  Do not attempt resuscitation (DNR)   Continuous    Question Answer Comment  In the event of cardiac or respiratory ARREST Do not call a "code blue"   In the event of cardiac or respiratory ARREST Do not perform Intubation, CPR, defibrillation or ACLS   In the event of cardiac or respiratory ARREST Use medication by any route, position, wound care, and other measures to relive pain and suffering. May use oxygen, suction and manual treatment of airway obstruction as needed for comfort.   Comments Pt does not want a Permanent Feeding Tube or to be on a ventilator ever again.  He is OK with dialysis, however.      06/21/15 1714      TOTAL TIME TAKING CARE OF THIS PATIENT: 38 minutes.    Gladstone Lighter M.D on  07/12/2015 at 10:45 AM  Between 7am to 6pm - Pager - [phone]  After 6pm go to [email or web address] - password [email or web address] [facility]  [facility]  Office  [phone]  CC: Primary care physician; No PCP Per Patient

## 2015-07-12 NOTE — Discharge Instructions (Signed)
[facility] Failure [facility] appointment on July 19, 2015 at 10:00am with Darylene Price, [place]. Please call [phone] to reschedule.   Continue foley for another 10 days and then- remove for a voiding trial.  Please contact your physician for any shortness of breath or difficulty breathing; any pain, weakness, or confusion; or any other questions or concerns.

## 2015-07-12 NOTE — Care Management (Signed)
Oren Section at [facility] via email:  spoke with patient and he stated he has adequate support in place and nursing to care for his legs so we have reached out to [facility] and asked that the SW contact us once they open and we will go from there in helping to meet his needs.  He refused having [place] come out in the am.  Just wanted you to be aware.  We will follow up with him tomorrow.  Thanks MW

## 2015-07-19 ENCOUNTER — Encounter: Payer: Self-pay | Admitting: Family

## 2015-07-19 ENCOUNTER — Ambulatory Visit: Payer: Medicaid Other | Attending: Family | Admitting: Family

## 2015-07-19 VITALS — BP 126/61 | HR 98 | Resp 20 | Ht 68.0 in | Wt 205.0 lb

## 2015-07-19 DIAGNOSIS — I499 Cardiac arrhythmia, unspecified: Secondary | ICD-10-CM | POA: Insufficient documentation

## 2015-07-19 DIAGNOSIS — Z7982 Long term (current) use of aspirin: Secondary | ICD-10-CM | POA: Diagnosis not present

## 2015-07-19 DIAGNOSIS — L03116 Cellulitis of left lower limb: Secondary | ICD-10-CM | POA: Insufficient documentation

## 2015-07-19 DIAGNOSIS — I129 Hypertensive chronic kidney disease with stage 1 through stage 4 chronic kidney disease, or unspecified chronic kidney disease: Secondary | ICD-10-CM | POA: Insufficient documentation

## 2015-07-19 DIAGNOSIS — L03115 Cellulitis of right lower limb: Secondary | ICD-10-CM | POA: Diagnosis not present

## 2015-07-19 DIAGNOSIS — I4891 Unspecified atrial fibrillation: Secondary | ICD-10-CM | POA: Diagnosis not present

## 2015-07-19 DIAGNOSIS — I10 Essential (primary) hypertension: Secondary | ICD-10-CM

## 2015-07-19 DIAGNOSIS — I5022 Chronic systolic (congestive) heart failure: Secondary | ICD-10-CM | POA: Insufficient documentation

## 2015-07-19 DIAGNOSIS — N189 Chronic kidney disease, unspecified: Secondary | ICD-10-CM | POA: Insufficient documentation

## 2015-07-19 DIAGNOSIS — Z79899 Other long term (current) drug therapy: Secondary | ICD-10-CM | POA: Diagnosis not present

## 2015-07-19 NOTE — Progress Notes (Signed)
Subjective:    Patient ID: Aaron Harrison, male    DOB: 08-18-1951, 64 y.o.   MRN: [id number]  Congestive Heart Failure Presents for follow-up visit. The disease course has been improving. Associated symptoms include edema, fatigue (improving) and shortness of breath (improving). Pertinent negatives include no abdominal pain, chest pain, chest pressure, orthopnea or palpitations. Past treatments include salt and fluid restriction. The treatment provided moderate relief. Compliance with prior treatments has been good. His past medical history is significant for arrhythmia. There is no history of CVA or DVT. He has one 1st degree relative with heart disease.  Other This is a chronic (edema) problem. The current episode started more than 1 month ago. The problem occurs daily. The problem has been gradually improving. Associated symptoms include coughing (improving) and fatigue (improving). Pertinent negatives include no abdominal pain, chest pain, congestion, headaches, neck pain, numbness or sore throat. The symptoms are aggravated by standing and walking. He has tried position changes for the symptoms. The treatment provided significant relief.    Past Medical History  Diagnosis Date  . Chronic atrial fibrillation     a. not on long term anticoagulation  . Pneumonia 2011  . Chronic systolic CHF (congestive heart failure)   . Cellulitis   . Arrhythmia     Past Surgical History  Procedure Laterality Date  . Peripheral vascular catheterization N/A 06/27/2015    Procedure: Dialysis/Perma Catheter Insertion;  Surgeon: Algernon Huxley, MD;  Location: [facility];  Service: Cardiovascular;  Laterality: N/A;  . Peripheral vascular catheterization Right 07/05/2015    Procedure: Dialysis/Perma Catheter Removal;  Surgeon: Algernon Huxley, MD;  Location: [facility];  Service: Cardiovascular;  Laterality: Right;    Family History  Problem Relation Age of Onset  . Diabetes Mother   . Stroke  Mother   . Heart failure Father     Social History  Substance Use Topics  . Smoking status: Never Smoker   . Smokeless tobacco: Never Used  . Alcohol Use: No    Allergies  Allergen Reactions  . Penicillins Nausea And Vomiting    Prior to Admission medications   Medication Sig Start Date End Date Taking? Authorizing Anahita Cua  aspirin EC 81 MG tablet Take 81 mg by mouth daily.   Yes Historical Holley Kocurek, MD  furosemide (LASIX) 20 MG tablet Take 1 tablet (20 mg total) by mouth daily. 07/12/15  Yes Gladstone Lighter, MD  potassium chloride (K-DUR) 10 MEQ tablet Take 1 tablet (10 mEq total) by mouth daily. 07/12/15  Yes Gladstone Lighter, MD  tamsulosin (FLOMAX) 0.4 MG CAPS capsule Take 1 capsule (0.4 mg total) by mouth daily. 07/12/15  Yes Gladstone Lighter, MD     Review of Systems  Constitutional: Positive for fatigue (improving). Negative for appetite change.  HENT: Negative for congestion, postnasal drip and sore throat.   Eyes: Negative.   Respiratory: Positive for cough (improving) and shortness of breath (improving). Negative for chest tightness.   Cardiovascular: Positive for leg swelling (right leg). Negative for chest pain and palpitations.  Gastrointestinal: Negative for abdominal pain and abdominal distention.  Endocrine: Negative.   Genitourinary: Negative for dysuria and hematuria.       Foley catheter present   Musculoskeletal: Negative for back pain and neck pain.  Skin: Positive for wound (right lower leg). Negative for color change.  Allergic/Immunologic: Negative.   Neurological: Positive for dizziness (infrequent). Negative for light-headedness, numbness and headaches.  Hematological: Negative for adenopathy. Bruises/bleeds easily (  on arms).  Psychiatric/Behavioral: Positive for sleep disturbance (sleeping in recliner due to comfort). Negative for dysphoric mood. The patient is nervous/anxious (to get rid of foley catheter).        Objective:   Physical Exam   Constitutional: He is oriented to person, place, and time. He appears well-developed and well-nourished.  HENT:  Head: Normocephalic and atraumatic.  Eyes: Conjunctivae are normal. Pupils are equal, round, and reactive to light.  Neck: Normal range of motion. Neck supple.  Cardiovascular: An irregularly irregular rhythm present. Tachycardia present.   Pulmonary/Chest: Effort normal. He has no wheezes. He has no rales.  Abdominal: Soft. He exhibits no distension. There is no tenderness.  Musculoskeletal: He exhibits edema (edema noted and both legs wrapped in kerlex due to cellulitis). He exhibits no tenderness.  Neurological: He is alert and oriented to person, place, and time.  Skin: Skin is warm and dry.  Psychiatric: He has a normal mood and affect. His behavior is normal. Thought content normal.  Nursing note and vitals reviewed.  BP 126/61 mmHg  Pulse 98  Resp 20  Ht 5\' 8"  (1.727 m)  Wt 205 lb (92.987 kg)  BMI 31.18 kg/m2  SpO2 100%        Assessment & Plan:  1: Chronic heart failure with reduced ejection fraction- Patient presents with fatigue and shortness of breath with moderate exertion. He says that he did not feel short of breath upon walking into the office today but would have if he would have had to walk any farther. When he does experience symptoms, he'll sit down for a few minutes until his symptoms resolve. Overall, he feels better. Continues to weigh daily and has noticed quite a weight loss since his hospital admission. By our scale, his weight is down 58 pounds since he was last here on 05/11/15. Reminded to call for an overnight weight gain of >2 pounds or a weekly weight gain of >5 pounds. He is not adding any salt to his food and tries to eat low sodium foods. Does not have a cardiologist and would like to wait until 2017 as he's hoping to have insurance beginning in January. Does not want to be seen at [facility]. Discussed adding medications in the future to help his  heart function.  2: HTN- Blood pressure looks great today.  3: Cellulitis- Both legs are currently wrapped and it gets changed on a weekly basis by the home health nurse. He says that neither leg is draining and have very little reddness left. Has seen his PCP since discharge and has another appointment in October 2016. 4: Chronic renal insufficiency- Has an appointment with the nephrologist on 07/30/15. Continues to have a foley catheter present.  5: Atrial fibrillation- This is chronic and he says that "my heart has always been irregular".  Not interested in anticoagulation.   Return in 2 months or sooner for any questions/problems before then.

## 2015-07-19 NOTE — Patient Instructions (Addendum)
Continue weighing daily and call for an overnight weight gain of > 2 pounds or a weekly weight gain of >5 pounds. 

## 2015-08-07 ENCOUNTER — Ambulatory Visit: Payer: Self-pay | Admitting: Obstetrics and Gynecology

## 2015-08-15 ENCOUNTER — Ambulatory Visit: Payer: Self-pay | Admitting: Obstetrics and Gynecology

## 2015-09-12 ENCOUNTER — Ambulatory Visit: Payer: Self-pay | Admitting: Obstetrics and Gynecology

## 2015-09-18 ENCOUNTER — Ambulatory Visit: Payer: Medicaid Other | Attending: Family | Admitting: Family

## 2015-09-18 ENCOUNTER — Encounter: Payer: Self-pay | Admitting: Family

## 2015-09-18 VITALS — BP 163/74 | HR 69 | Resp 18 | Ht 68.0 in | Wt 214.0 lb

## 2015-09-18 DIAGNOSIS — Z7982 Long term (current) use of aspirin: Secondary | ICD-10-CM | POA: Insufficient documentation

## 2015-09-18 DIAGNOSIS — I10 Essential (primary) hypertension: Secondary | ICD-10-CM | POA: Insufficient documentation

## 2015-09-18 DIAGNOSIS — I5022 Chronic systolic (congestive) heart failure: Secondary | ICD-10-CM | POA: Diagnosis present

## 2015-09-18 DIAGNOSIS — Z79899 Other long term (current) drug therapy: Secondary | ICD-10-CM | POA: Insufficient documentation

## 2015-09-18 DIAGNOSIS — I482 Chronic atrial fibrillation, unspecified: Secondary | ICD-10-CM

## 2015-09-18 NOTE — Patient Instructions (Signed)
Continue weighing daily and call for an overnight weight gain of > 2 pounds or a weekly weight gain of >5 pounds. 

## 2015-09-18 NOTE — Progress Notes (Signed)
Subjective:    Patient ID: Aaron Harrison, male    DOB: 06-Sep-1951, 64 y.o.   MRN: [id number]  Congestive Heart Failure Presents for follow-up visit. The disease course has been stable. Associated symptoms include fatigue (better). Pertinent negatives include no abdominal pain, chest pain, chest pressure, edema, orthopnea, palpitations or shortness of breath. The symptoms have been stable. Past treatments include salt and fluid restriction. The treatment provided moderate relief. Compliance with prior treatments has been good. His past medical history is significant for arrhythmia. Compliance with total regimen is 76-100%.  Hypertension This is a chronic problem. The current episode started more than 1 year ago. The problem has been waxing and waning since onset. Pertinent negatives include no chest pain, headaches, neck pain, palpitations, peripheral edema or shortness of breath. There are no associated agents to hypertension. Risk factors for coronary artery disease include male gender. Past treatments include diuretics and beta blockers. The current treatment provides mild improvement. Compliance problems include exercise.  Hypertensive end-organ damage includes kidney disease and heart failure.    Past Medical History  Diagnosis Date  . Chronic atrial fibrillation (HCC)     a. not on long term anticoagulation  . Pneumonia 2011  . Chronic systolic CHF (congestive heart failure) ([place])   . Cellulitis   . Arrhythmia     Past Surgical History  Procedure Laterality Date  . Peripheral vascular catheterization N/A 06/27/2015    Procedure: Dialysis/Perma Catheter Insertion;  Surgeon: Algernon Huxley, MD;  Location: [facility];  Service: Cardiovascular;  Laterality: N/A;  . Peripheral vascular catheterization Right 07/05/2015    Procedure: Dialysis/Perma Catheter Removal;  Surgeon: Algernon Huxley, MD;  Location: [facility];  Service: Cardiovascular;  Laterality: Right;    Family  History  Problem Relation Age of Onset  . Diabetes Mother   . Stroke Mother   . Heart failure Father     Social History  Substance Use Topics  . Smoking status: Never Smoker   . Smokeless tobacco: Never Used  . Alcohol Use: No    Allergies  Allergen Reactions  . Penicillins Nausea And Vomiting   Prior to Admission medications   Medication Sig Start Date End Date Taking? Authorizing Provider  aspirin EC 81 MG tablet Take 81 mg by mouth daily.   Yes Historical Provider, MD  furosemide (LASIX) 20 MG tablet Take 1 tablet (20 mg total) by mouth daily. 07/12/15  Yes Gladstone Lighter, MD  potassium chloride (K-DUR) 10 MEQ tablet Take 1 tablet (10 mEq total) by mouth daily. 07/12/15  Yes Gladstone Lighter, MD  tamsulosin (FLOMAX) 0.4 MG CAPS capsule Take 1 capsule (0.4 mg total) by mouth daily. 07/12/15  Yes Gladstone Lighter, MD     Review of Systems  Constitutional: Positive for fatigue (better). Negative for appetite change.  HENT: Positive for congestion. Negative for postnasal drip and sore throat.   Eyes: Negative.   Respiratory: Negative for cough, chest tightness and shortness of breath.   Cardiovascular: Negative for chest pain, palpitations and leg swelling.  Gastrointestinal: Negative for abdominal pain and abdominal distention.  Endocrine: Negative.   Genitourinary: Negative.   Musculoskeletal: Negative for back pain and neck pain.  Skin: Negative.   Allergic/Immunologic: Negative.   Neurological: Negative for dizziness, light-headedness and headaches.  Hematological: Negative for adenopathy. Bruises/bleeds easily.  Psychiatric/Behavioral: Negative for sleep disturbance (sleeping on 2 pillows) and dysphoric mood. The patient is not nervous/anxious.        Objective:  Physical Exam  Constitutional: He is oriented to person, place, and time. He appears well-developed and well-nourished.  HENT:  Head: Normocephalic and atraumatic.  Eyes: Conjunctivae are normal.  Pupils are equal, round, and reactive to light.  Neck: Normal range of motion. Neck supple.  Cardiovascular: Normal rate.  An irregular rhythm present.  Pulmonary/Chest: Effort normal. He has no wheezes. He has no rales.  Abdominal: Soft. He exhibits no distension. There is no tenderness.  Musculoskeletal: He exhibits no edema or tenderness.  Neurological: He is alert and oriented to person, place, and time.  Skin: Skin is warm and dry.  Psychiatric: He has a normal mood and affect. His behavior is normal. Thought content normal.  Nursing note and vitals reviewed.   BP 163/74 mmHg  Pulse 69  Resp 18  Ht 5\' 8"  (1.727 m)  Wt 214 lb (97.07 kg)  BMI 32.55 kg/m2  SpO2 100%       Assessment & Plan:  1: Chronic heart failure with reduced ejection fraction- Patient presents with a mild amount of fatigue (Class II) with exertion. 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 No further edema in his lower legs. He continues to weigh himself daily and reports a gradual weight gain at home. By our scale, he's gained 9 pounds since he was last here in September 2016. Reminded to call for an overnight weight gain of >2 pounds or a weekly weight gain of >5 pounds. He is not adding any salt to his food and tries to eat low sodium foods. Is not interested in any other medications until he's able to get approved for disability and get insurance.  2: HTN- Blood pressure elevated today and he says that he's taken his medications this morning. Blood pressure was good last time he was here so he is to continue to monitor it. Again, he's not interested in adding any medications at this time. 3: Atrial fibrillation- Only on aspirin and is not interested in further anticoagulation. Says that he's had this "all his life" and is not going to risk bleeding. Seems to understand the potential for a blood clot.   Return here  in 6 months or sooner for any questions/problems before then.

## 2015-09-25 ENCOUNTER — Ambulatory Visit: Payer: Self-pay | Admitting: Obstetrics and Gynecology

## 2015-11-15 ENCOUNTER — Ambulatory Visit: Payer: Self-pay | Admitting: Obstetrics and Gynecology

## 2016-03-17 ENCOUNTER — Ambulatory Visit: Payer: Self-pay | Admitting: Family

## 2016-04-02 ENCOUNTER — Encounter: Payer: Self-pay | Admitting: Family

## 2016-04-02 ENCOUNTER — Ambulatory Visit: Payer: Self-pay | Attending: Family | Admitting: Family

## 2016-04-02 VITALS — BP 162/98 | HR 75 | Resp 18 | Ht 68.0 in | Wt 250.0 lb

## 2016-04-02 DIAGNOSIS — I482 Chronic atrial fibrillation, unspecified: Secondary | ICD-10-CM

## 2016-04-02 DIAGNOSIS — R5383 Other fatigue: Secondary | ICD-10-CM | POA: Insufficient documentation

## 2016-04-02 DIAGNOSIS — I10 Essential (primary) hypertension: Secondary | ICD-10-CM

## 2016-04-02 DIAGNOSIS — Z79899 Other long term (current) drug therapy: Secondary | ICD-10-CM | POA: Insufficient documentation

## 2016-04-02 DIAGNOSIS — Z880 Allergy status to penicillin: Secondary | ICD-10-CM | POA: Insufficient documentation

## 2016-04-02 DIAGNOSIS — I5022 Chronic systolic (congestive) heart failure: Secondary | ICD-10-CM | POA: Insufficient documentation

## 2016-04-02 DIAGNOSIS — R0602 Shortness of breath: Secondary | ICD-10-CM | POA: Insufficient documentation

## 2016-04-02 DIAGNOSIS — Z7982 Long term (current) use of aspirin: Secondary | ICD-10-CM | POA: Insufficient documentation

## 2016-04-02 DIAGNOSIS — I110 Hypertensive heart disease with heart failure: Secondary | ICD-10-CM | POA: Insufficient documentation

## 2016-04-02 NOTE — Patient Instructions (Signed)
Continue weighing daily and call for an overnight weight gain of > 2 pounds or a weekly weight gain of >5 pounds. 

## 2016-04-02 NOTE — Progress Notes (Signed)
Subjective:    Patient ID: Aaron Harrison, male    DOB: 1951-01-22, 65 y.o.   MRN: [id number]  Congestive Heart Failure Presents for follow-up visit. The disease course has been stable. Associated symptoms include fatigue and shortness of breath. Pertinent negatives include no abdominal pain, chest pain, chest pressure, edema, orthopnea or palpitations. The symptoms have been stable. Past treatments include salt and fluid restriction. The treatment provided moderate relief. Compliance with prior treatments has been poor. Prior compliance problems include insurance issues. His past medical history is significant for arrhythmia and HTN. There is no history of CVA or DM. He has one 1st degree relative with heart disease.  Hypertension This is a chronic problem. The current episode started more than 1 year ago. The problem is unchanged. The problem is uncontrolled. Associated symptoms include shortness of breath. Pertinent negatives include no chest pain, headaches, neck pain, palpitations or peripheral edema. There are no associated agents to hypertension. Risk factors for coronary artery disease include family history, obesity and male gender. Past treatments include diuretics, lifestyle changes and ACE inhibitors. The current treatment provides mild improvement. Hypertensive end-organ damage includes kidney disease and heart failure. There is no history of CVA.   Past Medical History  Diagnosis Date  . Chronic atrial fibrillation (HCC)     a. not on long term anticoagulation  . Pneumonia 2011  . Chronic systolic CHF (congestive heart failure) ([place])   . Cellulitis   . Arrhythmia     Past Surgical History  Procedure Laterality Date  . Peripheral vascular catheterization N/A 06/27/2015    Procedure: Dialysis/Perma Catheter Insertion;  Surgeon: Algernon Huxley, MD;  Location: [facility];  Service: Cardiovascular;  Laterality: N/A;  . Peripheral vascular catheterization Right 07/05/2015     Procedure: Dialysis/Perma Catheter Removal;  Surgeon: Algernon Huxley, MD;  Location: [facility];  Service: Cardiovascular;  Laterality: Right;    Family History  Problem Relation Age of Onset  . Diabetes Mother   . Stroke Mother   . Heart failure Father     Social History  Substance Use Topics  . Smoking status: Never Smoker   . Smokeless tobacco: Never Used  . Alcohol Use: No    Allergies  Allergen Reactions  . Penicillins Nausea And Vomiting    Prior to Admission medications   Medication Sig Start Date End Date Taking? Authorizing Provider  aspirin EC 81 MG tablet Take 81 mg by mouth daily.   Yes Historical Provider, MD  furosemide (LASIX) 20 MG tablet Take 1 tablet (20 mg total) by mouth daily. Patient not taking: Reported on 04/02/2016 07/12/15   Gladstone Lighter, MD  potassium chloride (K-DUR) 10 MEQ tablet Take 1 tablet (10 mEq total) by mouth daily. Patient not taking: Reported on 04/02/2016 07/12/15   Gladstone Lighter, MD  tamsulosin (FLOMAX) 0.4 MG CAPS capsule Take 1 capsule (0.4 mg total) by mouth daily. Patient not taking: Reported on 04/02/2016 07/12/15   Gladstone Lighter, MD      Review of Systems  Constitutional: Positive for fatigue. Negative for appetite change.  HENT: Positive for rhinorrhea. Negative for congestion and sore throat.   Eyes: Negative.   Respiratory: Positive for shortness of breath. Negative for cough, chest tightness and wheezing.   Cardiovascular: Negative for chest pain, palpitations and leg swelling.  Gastrointestinal: Negative for abdominal pain and abdominal distention.  Endocrine: Negative.   Genitourinary: Negative.   Musculoskeletal: Positive for arthralgias (right knee). Negative for neck  pain.  Skin: Negative.   Allergic/Immunologic: Negative.   Neurological: Negative for dizziness, light-headedness and headaches.  Hematological: Negative for adenopathy. Bruises/bleeds easily.  Psychiatric/Behavioral: Positive for  agitation (due to finding out his medicaid had lapsed). Negative for sleep disturbance (sleeping on 2 pillows) and dysphoric mood. The patient is not nervous/anxious.        Objective:   Physical Exam  Constitutional: He is oriented to person, place, and time. He appears well-developed and well-nourished.  HENT:  Head: Normocephalic and atraumatic.  Eyes: Conjunctivae are normal. Pupils are equal, round, and reactive to light.  Neck: Normal range of motion. Neck supple.  Cardiovascular: Normal rate and regular rhythm.   Pulmonary/Chest: Effort normal. He has no wheezes. He has no rales.  Abdominal: Soft. He exhibits distension. There is no tenderness.  Musculoskeletal: He exhibits no edema or tenderness.  Neurological: He is alert and oriented to person, place, and time.  Skin: Skin is warm and dry.  Psychiatric: He has a normal mood and affect. His behavior is normal. Thought content normal.  Nursing note and vitals reviewed.  BP 162/98 mmHg  Pulse 75  Resp 18  Ht 5\' 8"  (1.727 m)  Wt 250 lb (113.399 kg)  BMI 38.02 kg/m2  SpO2 96%        Assessment & Plan:  1: Chronic heart failure with reduced ejection fraction- Patient presents with fatigue and shortness of breath upon moderate exertion (Class II). He says that he was a little short of breath upon walking into the office but once he sat down, his breathing quickly improved. He denies any swelling in his legs and doesn't feel like his abdomen is distended. He continues to weigh himself and reports a gradual weight gain but says that his appetite has increased. By our scale, he's gained 36 pounds in the last six months. He says that he's stopped taking his diuretic along with his other medications because he was going to the "bathroom all the time" and only takes aspirin now. Discussed the importance of taking the furosemide and potassium daily to get the fluid off. Also discussed other appropriate therapies that could potentially  benefit his heart such as carvedilol and/or entresto. He refused these therapies and says that he's not taking anything else.  2: HTN- Blood pressure markedly elevated on initial check and remained slightly elevated upon recheck but he says that he's very irritated because he found out when he got here to check in that his medicaid had termed as of 07/27/15 and he didn't know this. He says that he was told that as long as he had disability, he would have medicaid. Explained to patient that we do not handle the insurance here and since medicaid told us he was currently uninsured that he needed to speak with them to get this straightened out. Offered to cancel the appointment but he said that he would be seen since he was already here and he was then told that he would be sent a bill for this visit. He says "I don't care if you bill me as I'm not going to pay it anyways". Offered to treat his blood pressure but he refuses. Says it's only high because he's aggravated. Explained the potential consequences of untreated high blood pressure such as stroke, renal disease and/or death but he continues to refuse treatment. 3: Atrial fibrillation- Currently is rate controlled and in a regular rhythm. He's only taking a baby aspirin and has been offered anticoagulation in the past  but he has refused and continues to refuse saying "I've lived with this all my life". Is aware of the risk of developing a blood clot which could be released into the bloodstream causing a stroke, pulmonary embolus or DVT and he still refuses.  Patient did not bring his medications and says that he's only taking the aspirin.   Patient does not want to return at this time and says that he will call us if he feels like he needs to be seen.

## 2016-11-24 IMAGING — CR DG CHEST 1V PORT
1 series · 1 of 1 positions shown · non-contrast
Comparison: April 08, 2015.

CLINICAL DATA: Tachypnea, shallow breathing.

EXAM:
PORTABLE CHEST - 1 VIEW

[ap]
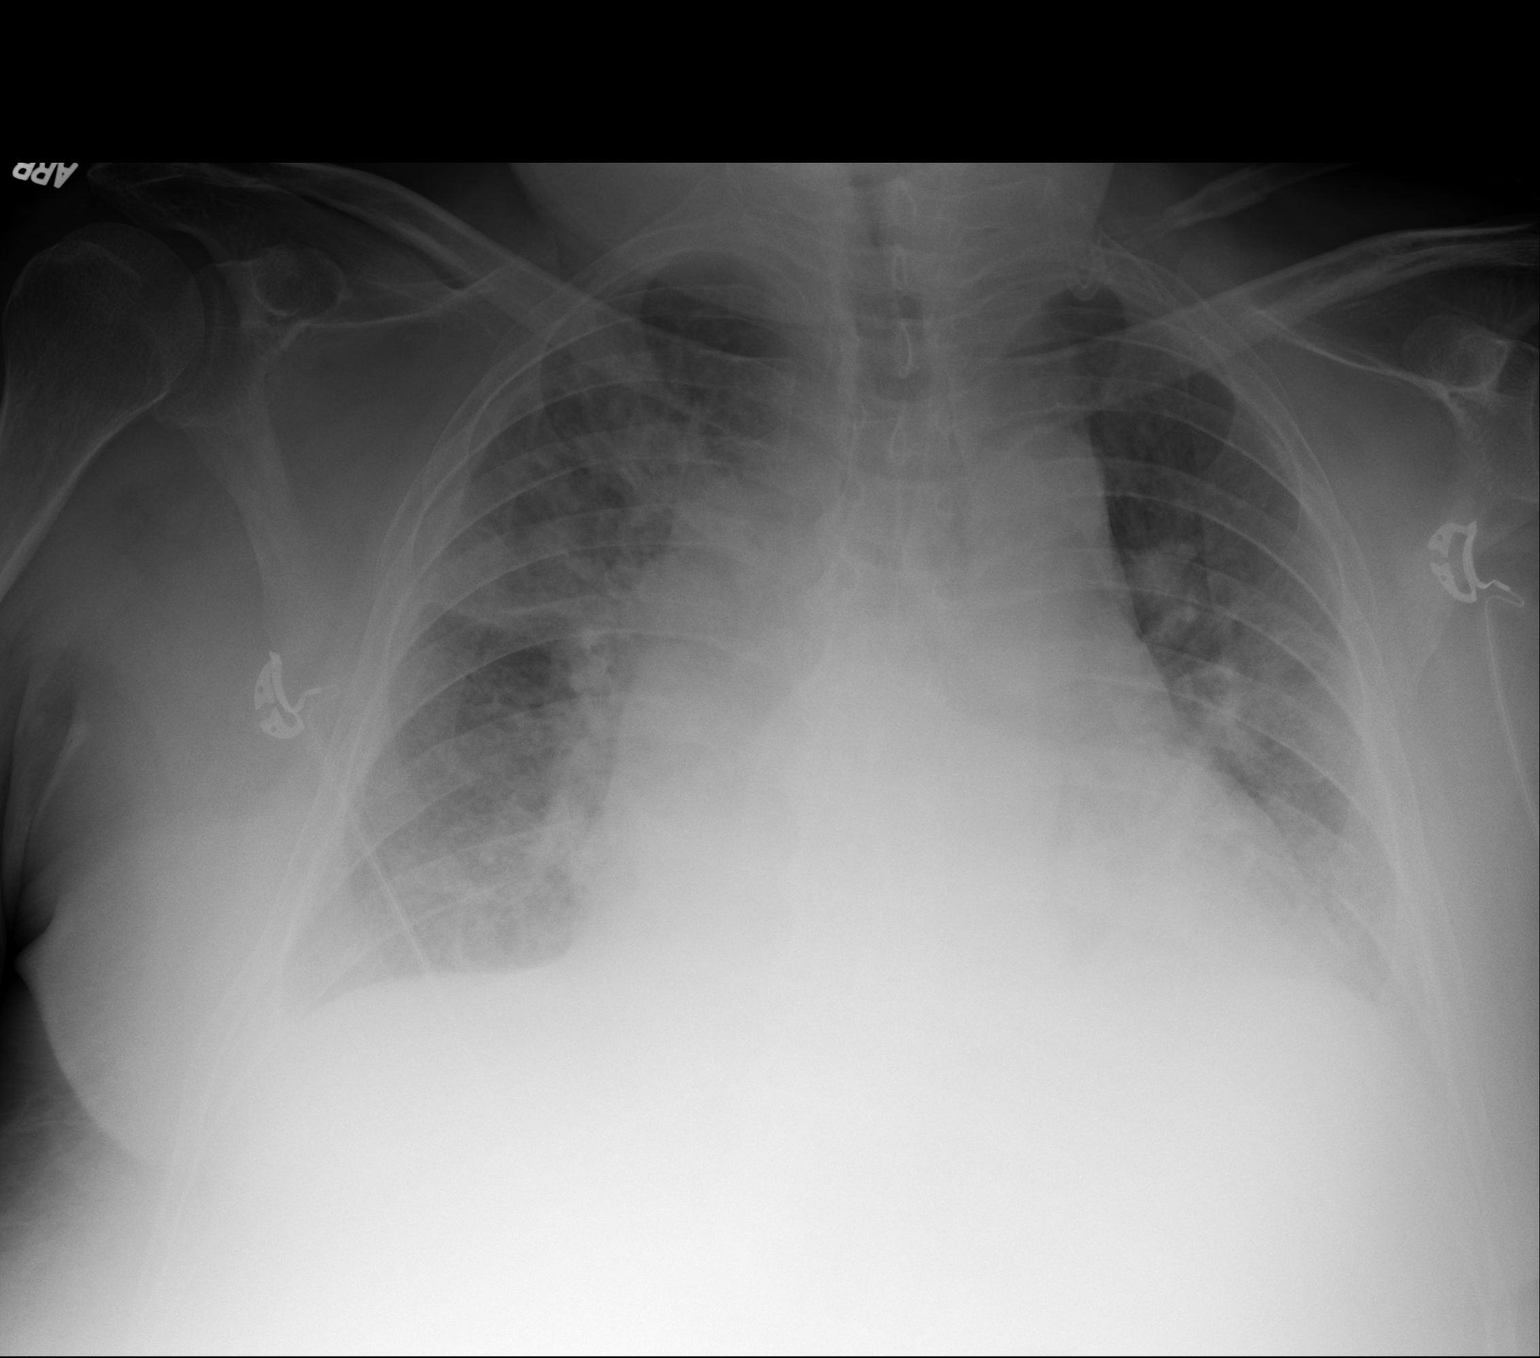

[1 of 1 positions shown; findings below may reference images not displayed]

FINDINGS: Stable cardiomegaly. No pneumothorax is noted. Increased central
pulmonary vascular congestion is noted. Mild bibasilar opacities are
noted concerning for subsegmental atelectasis, edema or effusions.
Increased right upper lobe opacity is noted concerning for possible
pneumonia or edema. Bony thorax is intact.
IMPRESSION: Cardiomegaly and increased central pulmonary vascular congestion is
noted, with increased right upper lobe opacity concerning for
pneumonia or possibly edema. Mild bibasilar opacities are noted
concerning for subsegmental atelectasis or edema with possible
associated minimal pleural effusions.

## 2016-11-24 IMAGING — CR DG CHEST 1V PORT
1 series · 1 of 1 positions shown · non-contrast
Comparison: 06/10/2015

CLINICAL DATA: Status post intubation

EXAM:
PORTABLE CHEST - 1 VIEW

[portable]
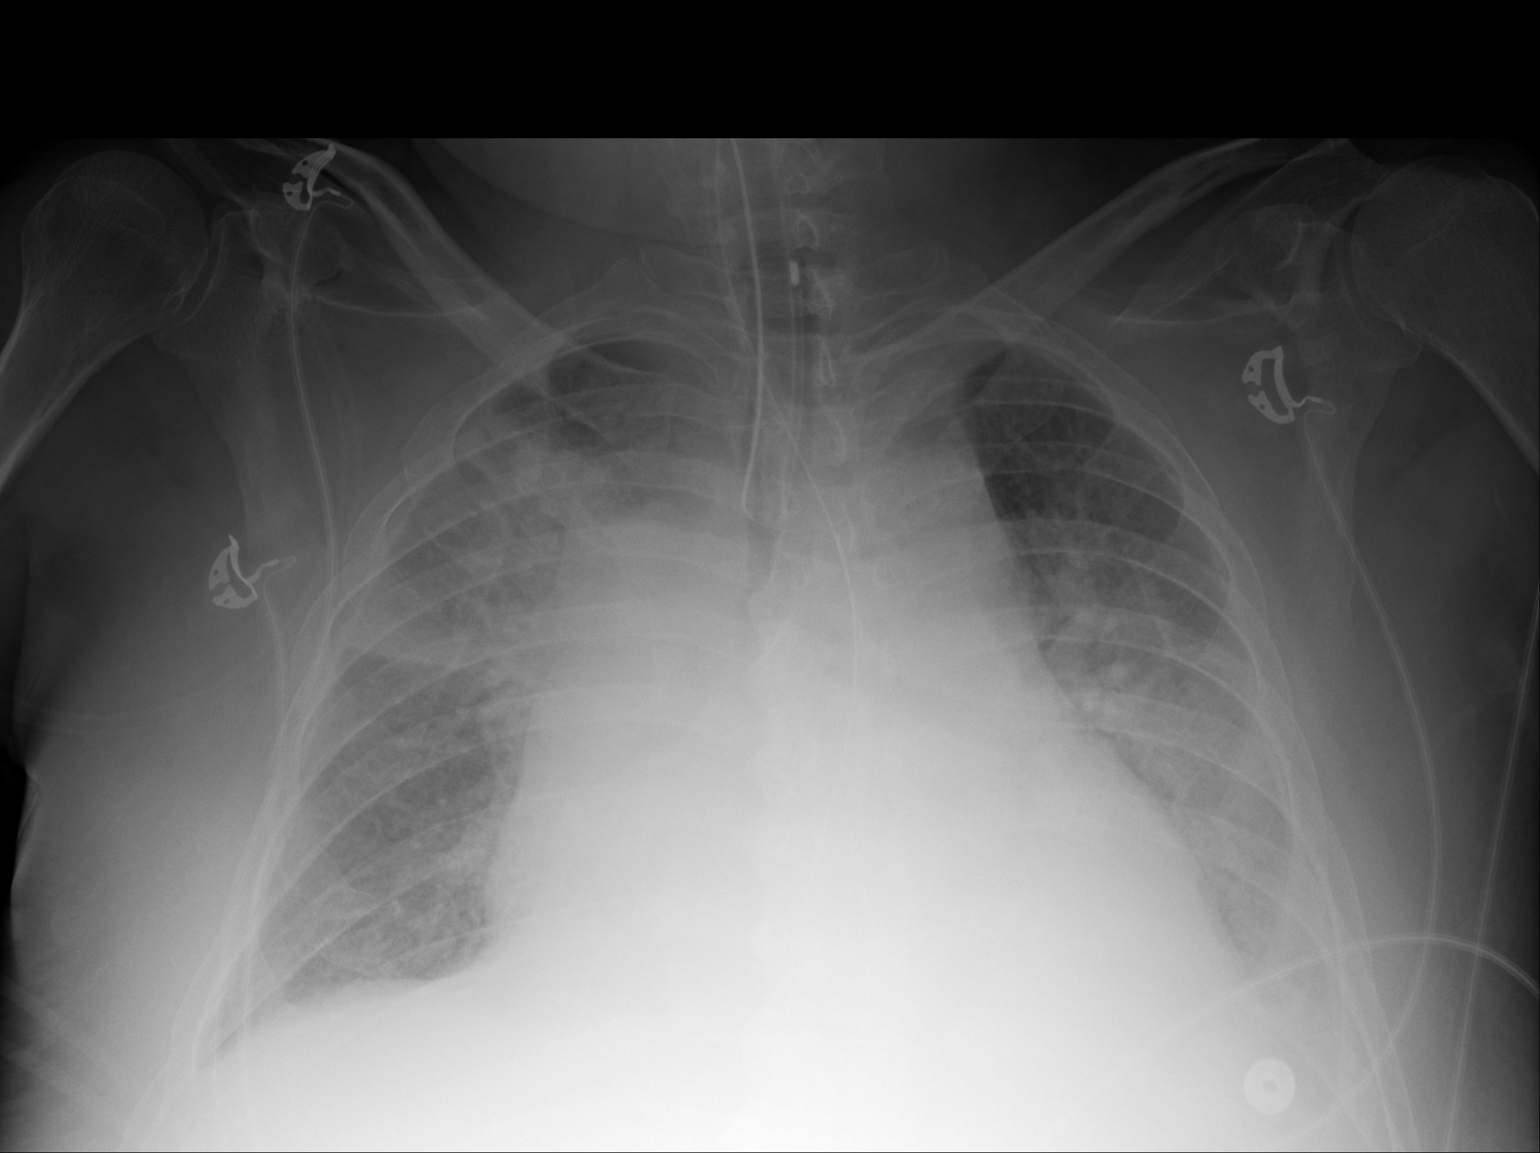

[1 of 1 positions shown; findings below may reference images not displayed]

FINDINGS: Cardiac shadow remains enlarged. A nasogastric catheter and
endotracheal tube are again seen. The endotracheal tube is been
withdrawn and now lies in satisfactory position. Increasing
consolidation within the right upper lobe is noted. Some patchy
changes are noted in the left lung base as well. The left changes
are stable from the recent exam.
IMPRESSION: Increasing right upper lobe consolidation.

## 2016-11-24 IMAGING — CR DG ABD PORTABLE 1V
1 series · 1 of 1 positions shown · non-contrast
Comparison: 06/10/2015 [DATE] p.m.

CLINICAL DATA: OG tube placement.

EXAM:
PORTABLE ABDOMEN - 1 VIEW [DATE] p.m.

[ap]
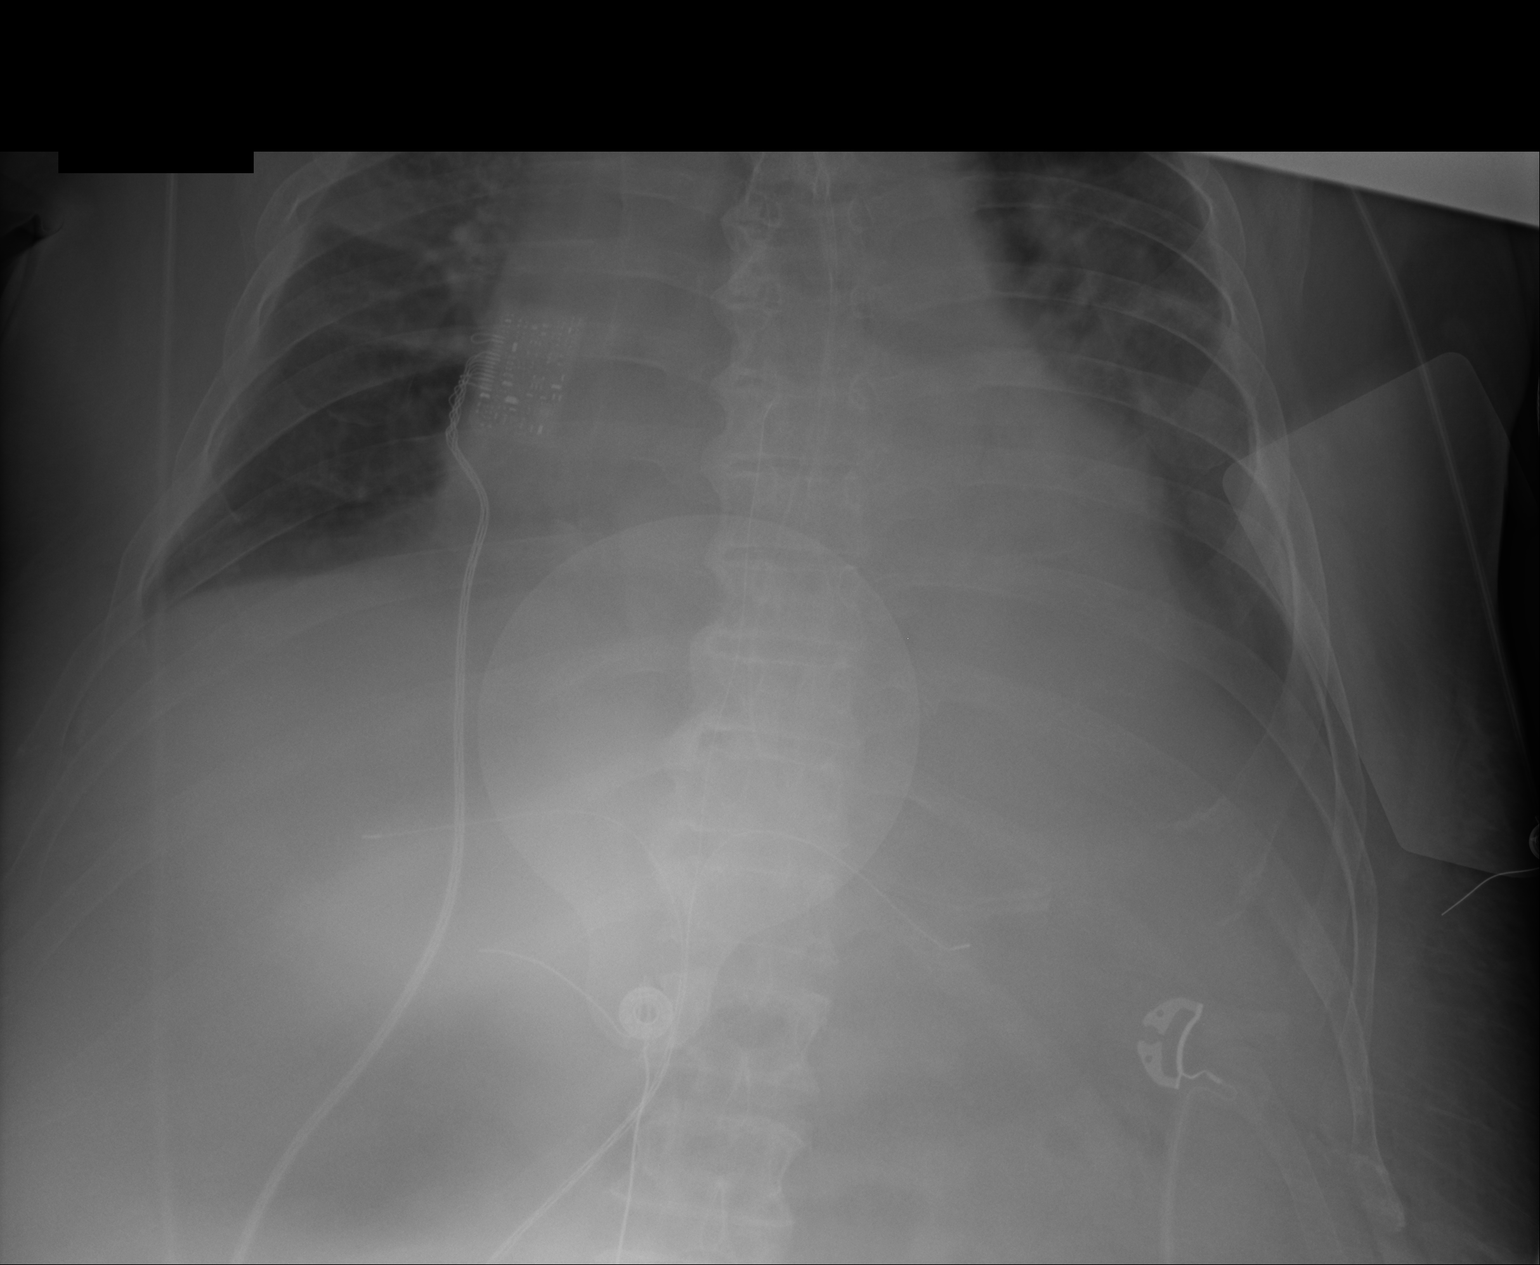

[1 of 1 positions shown; findings below may reference images not displayed]

FINDINGS: There is an OG tube with tip in the fundus of the stomach.
Endotracheal tube appears in good position 3 cm above the carina.

No visible dilated bowel. Pulmonary infiltrates noted in the right
upper lobe and left lower lobe.
IMPRESSION: OG tube tip in the fundus of the stomach.

## 2016-11-25 IMAGING — CR DG CHEST 1V PORT
1 series · 1 of 1 positions shown · non-contrast
Comparison: Chest radiograph performed 06/10/2015

CLINICAL DATA: Central line placement.  Initial encounter.

EXAM:
PORTABLE CHEST - 1 VIEW

[portable]
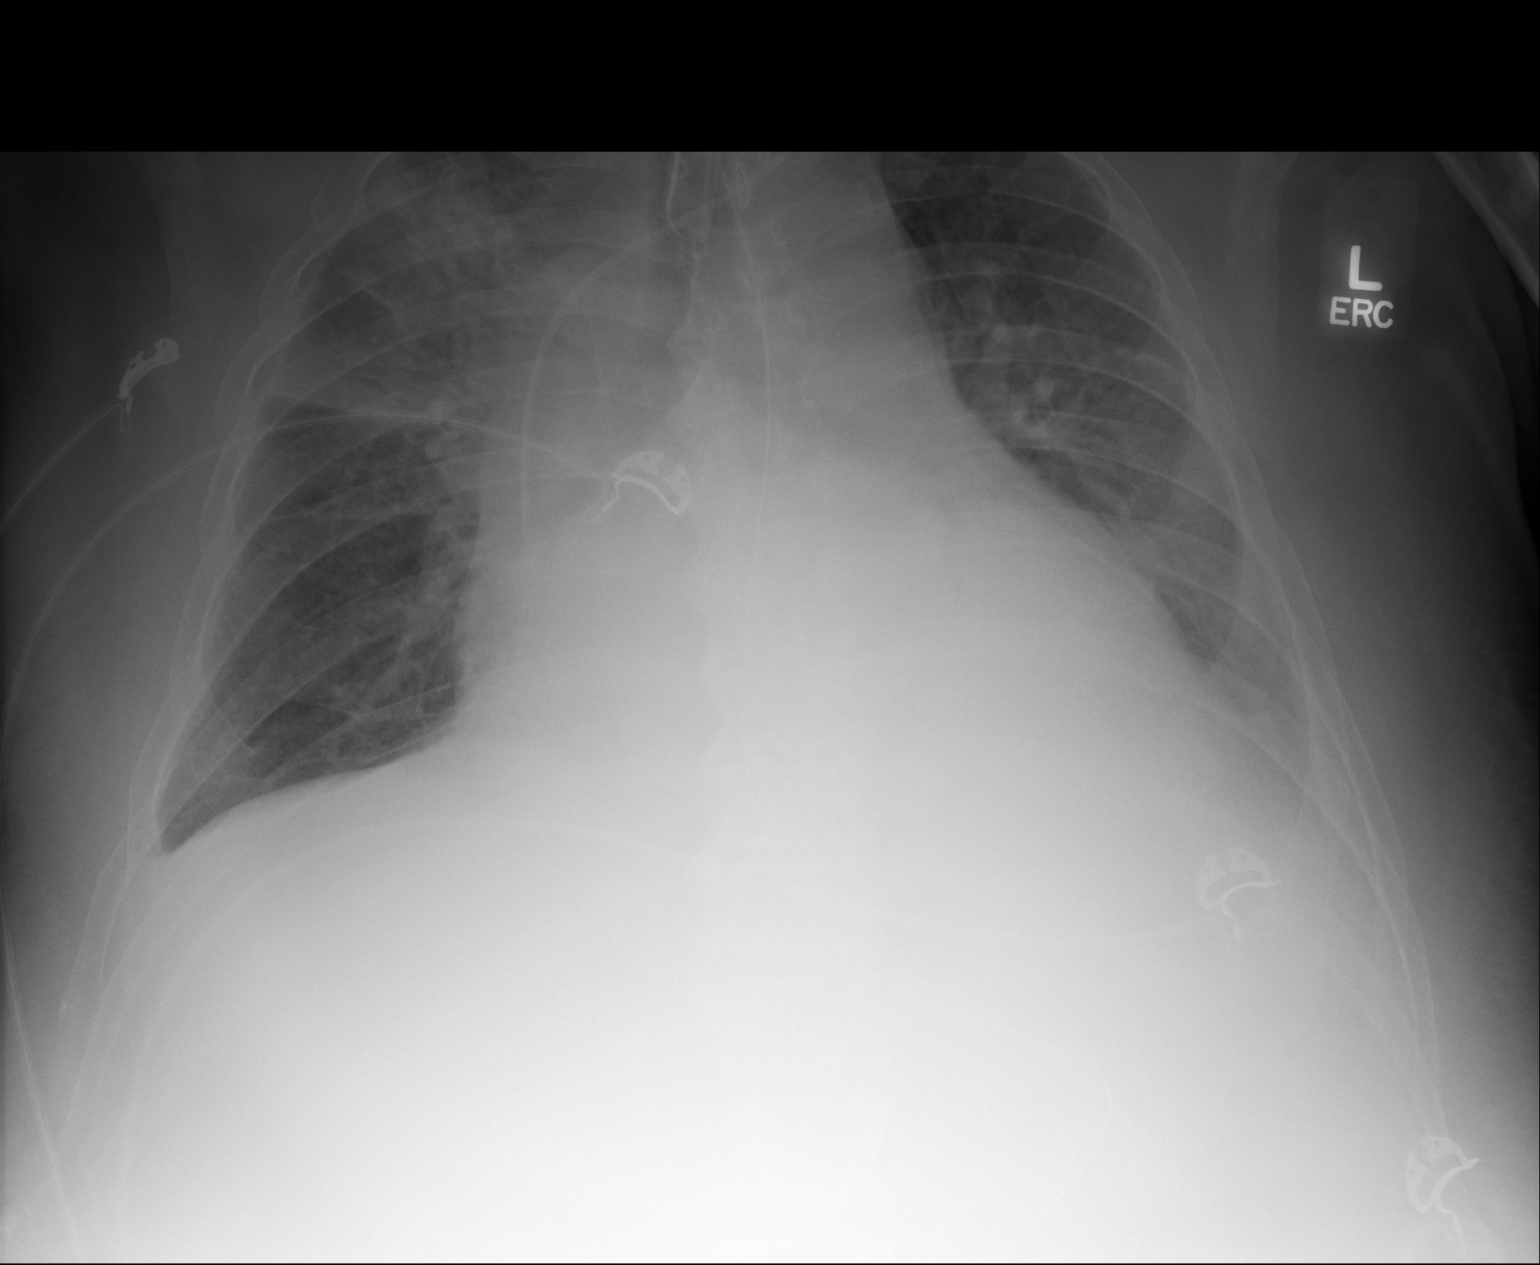

[1 of 1 positions shown; findings below may reference images not displayed]

FINDINGS: The patient's endotracheal tube is seen ending 3 cm above the
carina. A left subclavian line is noted ending about the distal SVC.

A small left pleural effusion is noted. Right upper lobe airspace
opacification is concerning for pneumonia, similar in appearance to
the recent prior study. Underlying vascular congestion is noted. No
pneumothorax is seen

The cardiomediastinal silhouette is enlarged. No acute osseous
abnormalities are identified.
IMPRESSION: 1. Endotracheal tube seen ending 3 cm above the carina.
2. Left subclavian line noted ending about the distal SVC.
3. Right upper lobe airspace opacification, compatible with
pneumonia, similar in appearance to the prior study.
4. Small left pleural effusion noted. Cardiomegaly and vascular
congestion.

## 2016-11-27 IMAGING — CR DG ABDOMEN 1V
1 series · 2 of 2 positions shown · non-contrast
Comparison: Single view of the abdomen 06/10/2015.

CLINICAL DATA: Status post OG tube placement.

EXAM:
ABDOMEN - 1 VIEW

[Series 1: ap · 0.17mm/px · 2 of 2 slices shown]
[im 1/2]
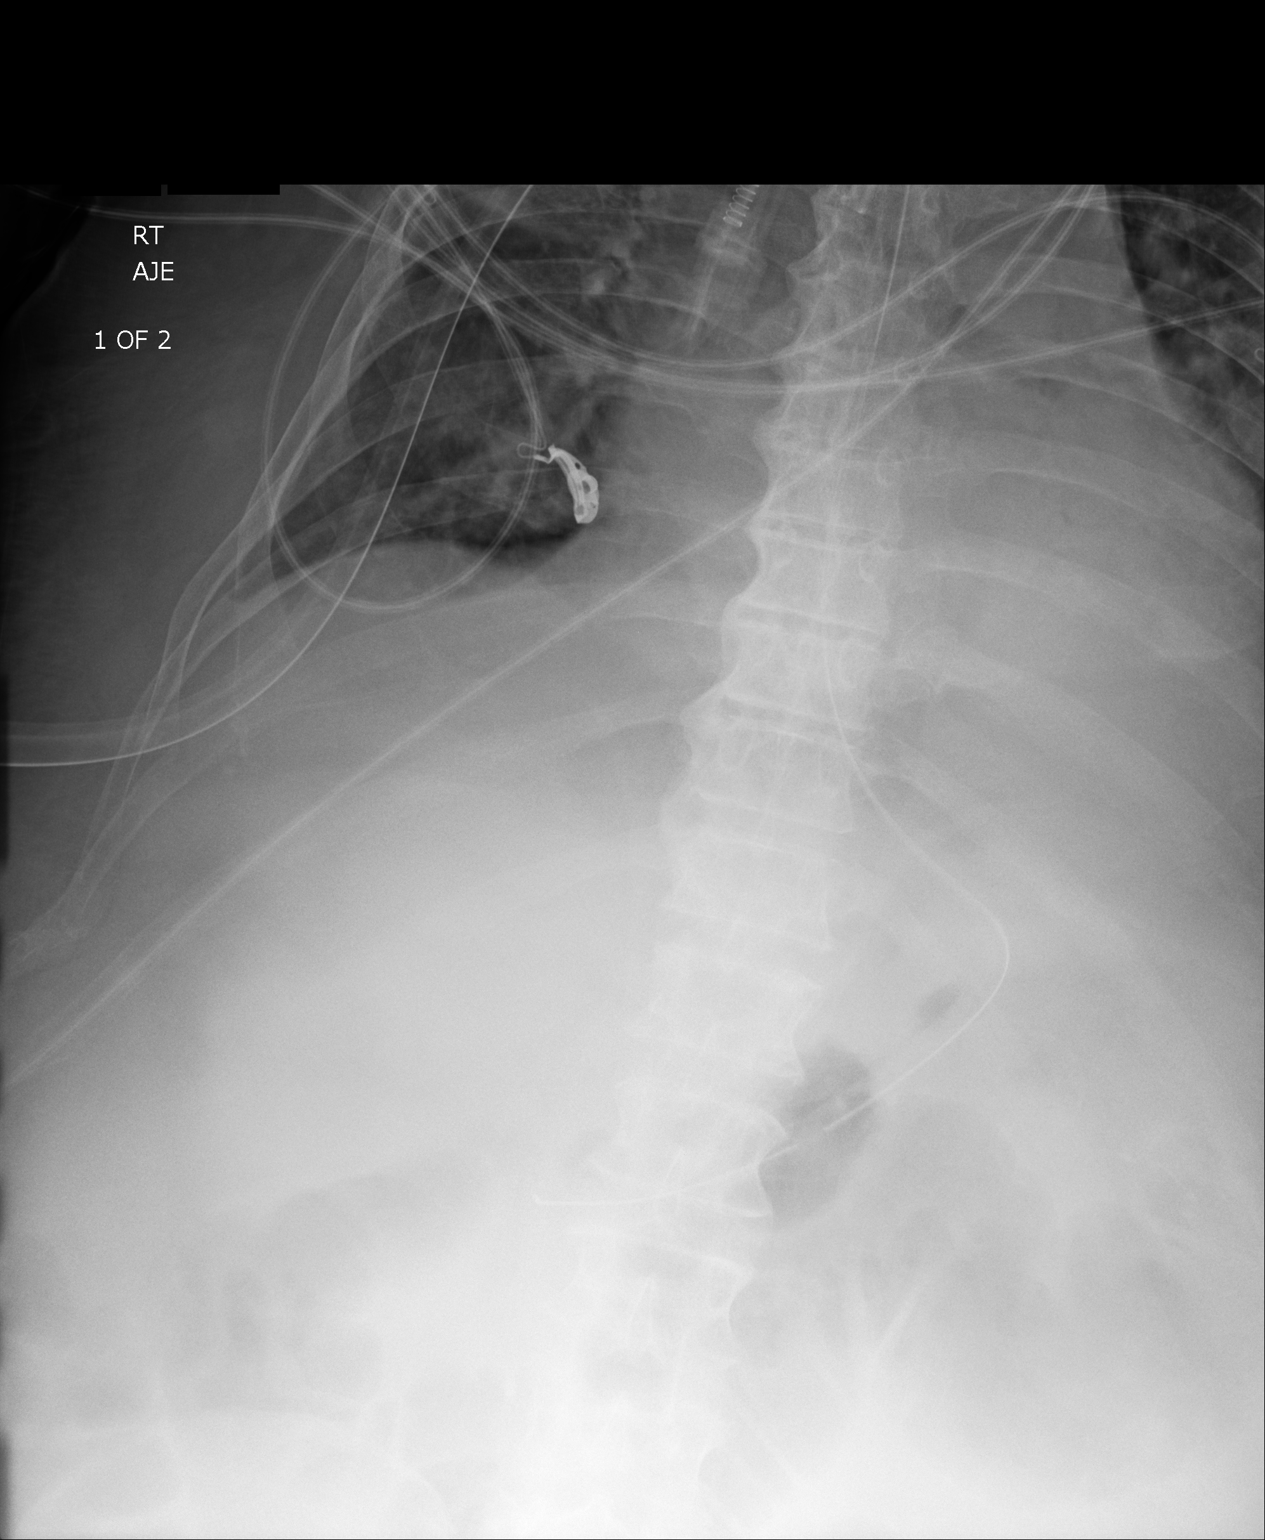
[im 2/2]
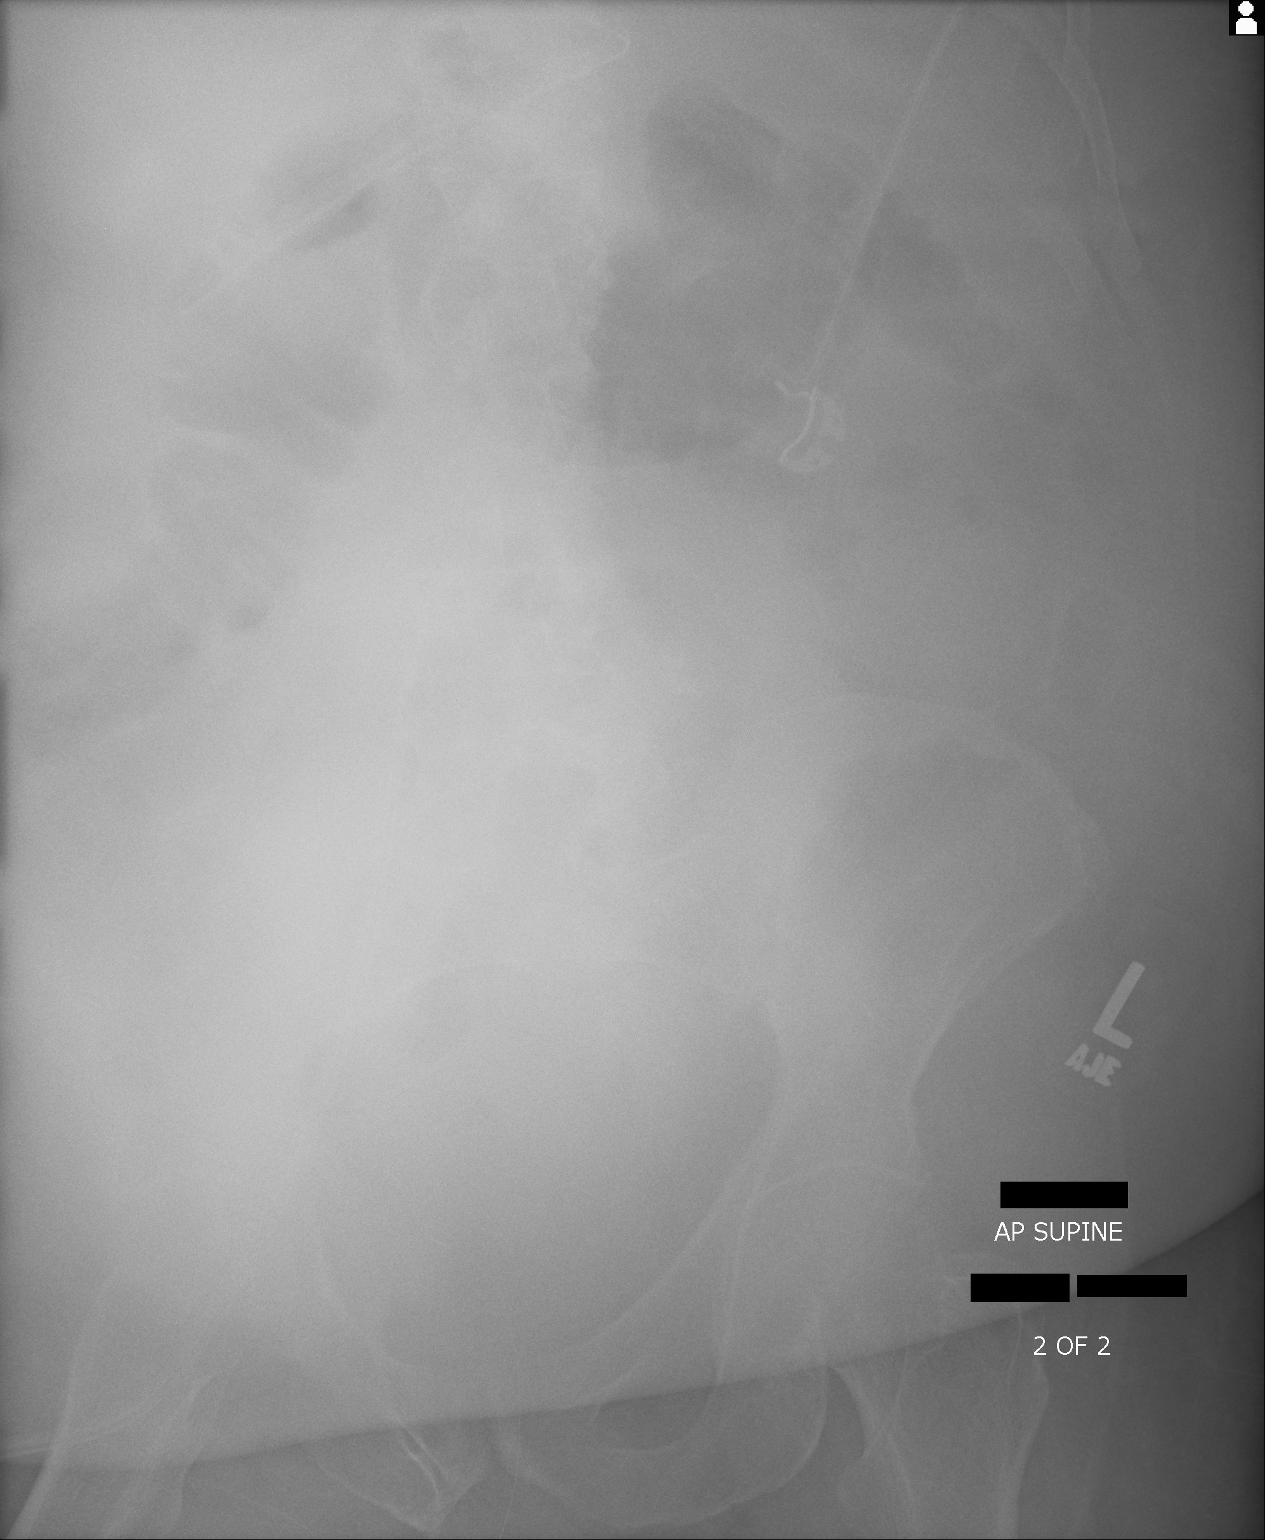

[2 of 2 positions shown; findings below may reference images not displayed]

FINDINGS: OG tube is in place with the tip in the distal stomach. The bowel
gas pattern is unremarkable.
IMPRESSION: OG tube tip is in the distal stomach.

## 2018-08-27 DIAGNOSIS — I639 Cerebral infarction, unspecified: Secondary | ICD-10-CM

## 2018-08-27 HISTORY — DX: Cerebral infarction, unspecified: I63.9

## 2018-09-05 ENCOUNTER — Encounter: Payer: Self-pay | Admitting: *Deleted

## 2018-09-05 ENCOUNTER — Emergency Department: Payer: Medicare Other

## 2018-09-05 ENCOUNTER — Other Ambulatory Visit: Payer: Self-pay

## 2018-09-05 ENCOUNTER — Inpatient Hospital Stay
Admission: EM | Admit: 2018-09-05 | Discharge: 2018-09-06 | DRG: 291 | Discharge status: Against Medical Advice | Payer: Medicare Other | Attending: Internal Medicine | Admitting: Internal Medicine

## 2018-09-05 DIAGNOSIS — Z79899 Other long term (current) drug therapy: Secondary | ICD-10-CM

## 2018-09-05 DIAGNOSIS — I480 Paroxysmal atrial fibrillation: Secondary | ICD-10-CM | POA: Diagnosis present

## 2018-09-05 DIAGNOSIS — Z8249 Family history of ischemic heart disease and other diseases of the circulatory system: Secondary | ICD-10-CM | POA: Diagnosis not present

## 2018-09-05 DIAGNOSIS — I509 Heart failure, unspecified: Secondary | ICD-10-CM | POA: Diagnosis not present

## 2018-09-05 DIAGNOSIS — N3941 Urge incontinence: Secondary | ICD-10-CM | POA: Diagnosis present

## 2018-09-05 DIAGNOSIS — N183 Chronic kidney disease, stage 3 (moderate): Secondary | ICD-10-CM | POA: Diagnosis present

## 2018-09-05 DIAGNOSIS — Z7982 Long term (current) use of aspirin: Secondary | ICD-10-CM | POA: Diagnosis not present

## 2018-09-05 DIAGNOSIS — I5023 Acute on chronic systolic (congestive) heart failure: Secondary | ICD-10-CM | POA: Diagnosis present

## 2018-09-05 DIAGNOSIS — D631 Anemia in chronic kidney disease: Secondary | ICD-10-CM | POA: Diagnosis present

## 2018-09-05 DIAGNOSIS — I5020 Unspecified systolic (congestive) heart failure: Secondary | ICD-10-CM

## 2018-09-05 DIAGNOSIS — Z880 Allergy status to penicillin: Secondary | ICD-10-CM

## 2018-09-05 DIAGNOSIS — I482 Chronic atrial fibrillation, unspecified: Secondary | ICD-10-CM | POA: Diagnosis present

## 2018-09-05 DIAGNOSIS — I130 Hypertensive heart and chronic kidney disease with heart failure and stage 1 through stage 4 chronic kidney disease, or unspecified chronic kidney disease: Principal | ICD-10-CM | POA: Diagnosis present

## 2018-09-05 DIAGNOSIS — D696 Thrombocytopenia, unspecified: Secondary | ICD-10-CM | POA: Diagnosis present

## 2018-09-05 DIAGNOSIS — Z66 Do not resuscitate: Secondary | ICD-10-CM | POA: Diagnosis present

## 2018-09-05 LAB — HEPATIC FUNCTION PANEL
ALT: 9 U/L (ref 0–44)
AST: 13 U/L — ABNORMAL LOW (ref 15–41)
Albumin: 3.8 g/dL (ref 3.5–5.0)
Alkaline Phosphatase: 62 U/L (ref 38–126)
BILIRUBIN DIRECT: 0.5 mg/dL — AB (ref 0.0–0.2)
BILIRUBIN INDIRECT: 1.8 mg/dL — AB (ref 0.3–0.9)
TOTAL PROTEIN: 7.2 g/dL (ref 6.5–8.1)
Total Bilirubin: 2.3 mg/dL — ABNORMAL HIGH (ref 0.3–1.2)

## 2018-09-05 LAB — BRAIN NATRIURETIC PEPTIDE: B Natriuretic Peptide: 813 pg/mL — ABNORMAL HIGH (ref 0.0–100.0)

## 2018-09-05 LAB — CBC
HCT: 30.5 % — ABNORMAL LOW (ref 39.0–52.0)
Hemoglobin: 9.3 g/dL — ABNORMAL LOW (ref 13.0–17.0)
MCH: 30 pg (ref 26.0–34.0)
MCHC: 30.5 g/dL (ref 30.0–36.0)
MCV: 98.4 fL (ref 80.0–100.0)
PLATELETS: 79 10*3/uL — AB (ref 150–400)
RBC: 3.1 MIL/uL — AB (ref 4.22–5.81)
RDW: 16.5 % — ABNORMAL HIGH (ref 11.5–15.5)
WBC: 2.3 10*3/uL — AB (ref 4.0–10.5)
nRBC: 0 % (ref 0.0–0.2)

## 2018-09-05 LAB — BASIC METABOLIC PANEL
Anion gap: 8 (ref 5–15)
BUN: 20 mg/dL (ref 8–23)
CO2: 23 mmol/L (ref 22–32)
CREATININE: 1.36 mg/dL — AB (ref 0.61–1.24)
Calcium: 8.6 mg/dL — ABNORMAL LOW (ref 8.9–10.3)
Chloride: 108 mmol/L (ref 98–111)
GFR, EST NON AFRICAN AMERICAN: 53 mL/min — AB (ref >60)
Glucose, Bld: 106 mg/dL — ABNORMAL HIGH (ref 70–99)
POTASSIUM: 4 mmol/L (ref 3.5–5.1)
SODIUM: 139 mmol/L (ref 135–145)

## 2018-09-05 LAB — TROPONIN I

## 2018-09-05 MED ORDER — FUROSEMIDE 10 MG/ML IJ SOLN
20.0000 mg | Freq: Three times a day (TID) | INTRAMUSCULAR | Status: DC
  Administered 2018-09-05 – 2018-09-06 (×2): 20 mg via INTRAVENOUS
  Filled 2018-09-05 (×3): qty 2

## 2018-09-05 MED ORDER — ASPIRIN EC 81 MG PO TBEC
81.0000 mg | DELAYED_RELEASE_TABLET | Freq: Every day | ORAL | Status: DC
  Administered 2018-09-06: 81 mg via ORAL
  Filled 2018-09-05: qty 1

## 2018-09-05 MED ORDER — METOPROLOL TARTRATE 25 MG PO TABS
25.0000 mg | ORAL_TABLET | Freq: Two times a day (BID) | ORAL | Status: DC
  Administered 2018-09-05 – 2018-09-06 (×2): 25 mg via ORAL
  Filled 2018-09-05 (×2): qty 1

## 2018-09-05 MED ORDER — HEPARIN SODIUM (PORCINE) 5000 UNIT/ML IJ SOLN
5000.0000 [IU] | Freq: Three times a day (TID) | INTRAMUSCULAR | Status: DC
  Filled 2018-09-05: qty 1

## 2018-09-05 MED ORDER — DOCUSATE SODIUM 100 MG PO CAPS: 100.0000 mg | ORAL_CAPSULE | Freq: Two times a day (BID) | ORAL | Status: DC | PRN

## 2018-09-05 MED ORDER — FUROSEMIDE 10 MG/ML IJ SOLN
20.0000 mg | Freq: Once | INTRAMUSCULAR | Status: AC
  Administered 2018-09-05: 20 mg via INTRAVENOUS

## 2018-09-05 MED ORDER — TAMSULOSIN HCL 0.4 MG PO CAPS
0.4000 mg | ORAL_CAPSULE | Freq: Every day | ORAL | Status: DC
  Administered 2018-09-05 – 2018-09-06 (×2): 0.4 mg via ORAL
  Filled 2018-09-05 (×2): qty 1

## 2018-09-05 NOTE — ED Triage Notes (Signed)
First Nurse Note:  Arrives today for a "wellness check".  Patient states he had his blood pressure checked yesterday by the [employer] and it was elevated.  States has history of CHF and has history of HTN, but does not take medications.  Arrives with friend who states that neighbors stated patient was confused yesterday and grey.

## 2018-09-05 NOTE — Progress Notes (Signed)
Family Meeting Note  Advance Directive:yes  Today a meeting took place with the Patient and POA.   The following clinical team members were present during this meeting:MD  The following were discussed:Patient's diagnosis: Systolic congestive heart failure, respiratory failure, chronic kidney disease, hypertension, Patient's progosis: Unable to determine and Goals for treatment: DNR  Additional follow-up to be provided: Cardiology  Time spent during discussion:20 minutes  Vaughan Basta, MD

## 2018-09-05 NOTE — ED Notes (Signed)
Pt dyspneic ambulating in room, sats drop to 83% RA.

## 2018-09-05 NOTE — H&P (Signed)
[facility] [place] NAME: Aaron Harrison    MR#:  [id number]  DATE OF BIRTH:  Dec 31, 1950  DATE OF ADMISSION:  09/05/2018  PRIMARY CARE PHYSICIAN: Donnie Coffin, MD   REQUESTING/REFERRING PHYSICIAN: malinda  CHIEF COMPLAINT:   Chief Complaint  Patient presents with  . Shortness of Breath    HISTORY OF PRESENT ILLNESS: Aaron Harrison  is a 67 y.o. male with a known history of cardiac arrhythmia, chronic atrial fibrillation, chronic systolic CHF with ejection fraction 35%, chronic kidney disease-does not like to take Lasix for CHF as it makes him to pee a lot. For last few days has shortness of breath which is more than his usual.  His neighbors also noted him short of breath and grayish color, concerned with this they suggested to go to emergency room. Emergency room he is noted to be in congestive heart failure and advised to admit to hospitalist team. Patient has mild mental disability but still able to live independently and have a legal guardian.  He initially was refusing to get admitted because he does not want Lasix, after explaining by ER physician that this might be just temporary for a day or 2, he agreed to stay in the hospital but said he would go home on Tuesday afternoon even if you do not discharge me.  PAST MEDICAL HISTORY:   Past Medical History:  Diagnosis Date  . Arrhythmia   . Cellulitis   . Chronic atrial fibrillation    a. not on long term anticoagulation  . Chronic systolic CHF (congestive heart failure) ([place])   . Pneumonia 2011    PAST SURGICAL HISTORY:  Past Surgical History:  Procedure Laterality Date  . PERIPHERAL VASCULAR CATHETERIZATION N/A 06/27/2015   Procedure: Dialysis/Perma Catheter Insertion;  Surgeon: Algernon Huxley, MD;  Location: [facility];  Service: Cardiovascular;  Laterality: N/A;  . PERIPHERAL VASCULAR CATHETERIZATION Right 07/05/2015   Procedure: Dialysis/Perma Catheter Removal;  Surgeon:  Algernon Huxley, MD;  Location: [facility];  Service: Cardiovascular;  Laterality: Right;    SOCIAL HISTORY:  Social History   Tobacco Use  . Smoking status: Never Smoker  . Smokeless tobacco: Never Used  Substance Use Topics  . Alcohol use: No    FAMILY HISTORY:  Family History  Problem Relation Age of Onset  . Diabetes Mother   . Stroke Mother   . Heart failure Father     DRUG ALLERGIES:  Allergies  Allergen Reactions  . Penicillins Nausea And Vomiting    REVIEW OF SYSTEMS:   CONSTITUTIONAL: No fever, fatigue or weakness.  EYES: No blurred or double vision.  EARS, NOSE, AND THROAT: No tinnitus or ear pain.  RESPIRATORY: No cough, have shortness of breath, no wheezing or hemoptysis.  CARDIOVASCULAR: No chest pain, orthopnea, edema.  GASTROINTESTINAL: No nausea, vomiting, diarrhea or abdominal pain.  GENITOURINARY: No dysuria, hematuria.  ENDOCRINE: No polyuria, nocturia,  HEMATOLOGY: No anemia, easy bruising or bleeding SKIN: No rash or lesion. MUSCULOSKELETAL: No joint pain or arthritis.   NEUROLOGIC: No tingling, numbness, weakness.  PSYCHIATRY: No anxiety or depression.   MEDICATIONS AT HOME:  Prior to Admission medications   Medication Sig Start Date End Date Taking? Authorizing Provider  aspirin EC 81 MG tablet Take 81 mg by mouth daily.   Yes [provider]  furosemide (LASIX) 20 MG tablet Take 1 tablet (20 mg total) by mouth daily. Patient not taking: Reported on 04/02/2016 07/12/15  Gladstone Lighter, MD  tamsulosin (FLOMAX) 0.4 MG CAPS capsule Take 1 capsule (0.4 mg total) by mouth daily. Patient not taking: Reported on 04/02/2016 07/12/15   Gladstone Lighter, MD      PHYSICAL EXAMINATION:   VITAL SIGNS: Blood pressure (!) 153/82, pulse 91, temperature 98.3 F (36.8 C), temperature source Oral, resp. rate 19, height 5\' 8"  (1.727 m), weight 86.2 kg, SpO2 (!) 83 %.  GENERAL:  67 y.o.-year-old patient lying in the bed with no acute  distress.  EYES: Pupils equal, round, reactive to light and accommodation. No scleral icterus. Extraocular muscles intact.  HEENT: Head atraumatic, normocephalic. Oropharynx and nasopharynx clear.  NECK:  Supple, no jugular venous distention. No thyroid enlargement, no tenderness.  LUNGS: Normal breath sounds bilaterally, no wheezing, some crepitation. No use of accessory muscles of respiration.  CARDIOVASCULAR: S1, S2 normal. No murmurs, rubs, or gallops.  ABDOMEN: Soft, nontender, nondistended. Bowel sounds present. No organomegaly or mass.  EXTREMITIES: No pedal edema, cyanosis, or clubbing.  NEUROLOGIC: Cranial nerves II through XII are intact. Muscle strength 5/5 in all extremities. Sensation intact. Gait not checked.  PSYCHIATRIC: The patient is alert and oriented x 3.  SKIN: No obvious rash, lesion, or ulcer.   LABORATORY PANEL:   CBC Recent Labs  Lab 09/05/18 1333  WBC 2.3*  HGB 9.3*  HCT 30.5*  PLT 79*  MCV 98.4  MCH 30.0  MCHC 30.5  RDW 16.5*   ------------------------------------------------------------------------------------------------------------------  Chemistries  Recent Labs  Lab 09/05/18 1400  NA 139  K 4.0  CL 108  CO2 23  GLUCOSE 106*  BUN 20  CREATININE 1.36*  CALCIUM 8.6*  AST 13*  ALT 9  ALKPHOS 62  BILITOT 2.3*   ------------------------------------------------------------------------------------------------------------------ estimated creatinine clearance is 57.8 mL/min (A) (by C-G formula based on SCr of 1.36 mg/dL (H)). ------------------------------------------------------------------------------------------------------------------ No results for input(s): TSH, T4TOTAL, T3FREE, THYROIDAB in the last 72 hours.  Invalid input(s): FREET3   Coagulation profile No results for input(s): INR, PROTIME in the last 168  hours. ------------------------------------------------------------------------------------------------------------------- No results for input(s): DDIMER in the last 72 hours. -------------------------------------------------------------------------------------------------------------------  Cardiac Enzymes Recent Labs  Lab 09/05/18 1400  TROPONINI <0.03   ------------------------------------------------------------------------------------------------------------------ Invalid input(s): POCBNP  ---------------------------------------------------------------------------------------------------------------  Urinalysis    Component Value Date/Time   COLORURINE AMBER (A) 06/10/2015 1642   APPEARANCEUR CLEAR (A) 06/10/2015 1642   LABSPEC 1.020 06/10/2015 1642   PHURINE 5.0 06/10/2015 1642   GLUCOSEU NEGATIVE 06/10/2015 1642   HGBUR 1+ (A) 06/10/2015 1642   BILIRUBINUR NEGATIVE 06/10/2015 1642   KETONESUR NEGATIVE 06/10/2015 1642   PROTEINUR 100 (A) 06/10/2015 1642   NITRITE NEGATIVE 06/10/2015 1642   LEUKOCYTESUR NEGATIVE 06/10/2015 1642     RADIOLOGY: Dg Chest 2 View  Result Date: 09/05/2018 CLINICAL DATA:  Patient with elevated blood pressure. EXAM: CHEST - 2 VIEW COMPARISON:  Chest radiograph 07/07/2015 FINDINGS: Stable cardiomegaly. Pulmonary vascular redistribution and bilateral interstitial pulmonary opacities. No pleural effusion or pneumothorax. Thoracic spine degenerative changes. IMPRESSION: Cardiomegaly, pulmonary vascular redistribution and mild interstitial edema. Electronically Signed   By: Lovey Newcomer M.D.   On: 09/05/2018 14:06    EKG: Orders placed or performed during the hospital encounter of 09/05/18  . ED EKG  . ED EKG  . EKG 12-Lead  . EKG 12-Lead    IMPRESSION AND PLAN:  *Acute on chronic systolic congestive heart failure Ejection fraction per echocardiogram 3 years ago was 35%. He will need to follow-up with CHF clinic. I will get a repeat  echocardiogram.  IV Lasix, fluid restriction, intake and output measurement.  *Chronic kidney disease stage III Today patient's creatinine is at best level 10 it was in last few years as per records in our hospital and outside. We will still need to keep checking the renal function as we do not know his to baseline.  *Hypertension We will give metoprolol oral twice daily.  *Chronic anemia Likely due to CKD. Continue to monitor.  All the records are reviewed and case discussed with ED provider. Management plans discussed with the patient, family and they are in agreement.  CODE STATUS: DNR. Code Status History    Date Active Date Inactive Code Status Order ID Comments User Context   06/21/2015 1714 07/12/2015 1802 DNR [id number]  Colleen Can, MD Inpatient   06/21/2015 1309 06/21/2015 1714 DNR [id number]  Colleen Can, MD Inpatient   06/15/2015 1719 06/21/2015 1309 Partial Code [id number] Once extubated, he is NOT TO BE RE-INTUBATED Colleen Can, MD Inpatient   06/10/2015 1833 06/15/2015 1712 Full Code [id number]  Demetrios Loll, MD Inpatient   04/07/2015 2236 04/11/2015 1707 Full Code [id number]  Idelle Crouch, MD Inpatient    Questions for Most Recent Historical Code Status (Order [id number])    Question Answer Comment   In the event of cardiac or respiratory ARREST Do not call a "code blue"    In the event of cardiac or respiratory ARREST Do not perform Intubation, CPR, defibrillation or ACLS    In the event of cardiac or respiratory ARREST Use medication by any route, position, wound care, and other measures to relive pain and suffering. May use oxygen, suction and manual treatment of airway obstruction as needed for comfort.    Comments Pt does not want a Permanent Feeding Tube or to be on a ventilator ever again.  He is OK with dialysis, however.         Advance Directive Documentation     Most Recent Value  Type of Advance Directive  Healthcare Power of Attorney   Pre-existing out of facility DNR order (yellow form or pink MOST form)  -  "MOST" Form in Place?  -     Patient's legal power of attorney was present in the room during my visit.  TOTAL TIME TAKING CARE OF THIS PATIENT: 45 minutes.    Vaughan Basta M.D on 09/05/2018   Between 7am to 6pm - Pager - [phone]  After 6pm go to [email or web address] - password [email or web address]  Office  [phone]  CC: Primary care physician; Donnie Coffin, MD   Note: This dictation was prepared with Dragon dictation along with smaller phrase technology. Any transcriptional errors that result from this process are unintentional.

## 2018-09-05 NOTE — ED Notes (Signed)
Pt states SOB when walking at facility he lives in ([facility]) POA brought pt here after EMS performed a wellness check on pt at facility and found BP to be elevated. States he can walk about 200 feet before he gets SOB. Doesn't wear oxygen at home. Used to take lasix but states "my doctor took me off of it cuz I couldn't stay out of the bathroom." A&O, no resp distress at this time. 100% on RA.

## 2018-09-05 NOTE — ED Triage Notes (Addendum)
Pt reporting he is in the ED for dyspnea upon exertion. Oxygen saturation after walking through lobby dropped to 80% with noted increase in pts WOB. Pt able to rest in treatment bed and oxygenation on RA returned to 100%. Mild swelling noted to lower extremities. PT reporting a hx of lasix use but could no longer take the medications because he did not have the time to use the restroom as much as he was.Pt is alert and oriented but has a foul urine odor upon arrival. Pt also reporting blood pressure has been elevated since yesterday. No headache, Chest pain or dizziness reported.

## 2018-09-05 NOTE — ED Notes (Signed)
Re-sent green top to lab at this time.

## 2018-09-05 NOTE — ED Notes (Signed)
Pt taken to xray 

## 2018-09-05 NOTE — ED Provider Notes (Signed)
[facility] Emergency Department Provider Note   ____________________________________________   First MD Initiated Contact with Patient 09/05/18 1339     (approximate)  I have reviewed the triage vital signs and the nursing notes.   HISTORY  Chief Complaint Shortness of Breath   HPI Aaron Harrison is a 67 y.o. male patient reports he gets short of breath while walking.  EMS did a welfare check on him at [facility] care [facility] and found his blood pressure to be elevated.  When he was walking here his sats dropped to 80 before he got to the room.  They went back up to the high 90s laying in bed.  He says his doctor took him off of Lasix because he could not stay out of the bathroom.  He has been in the hospital previously for congestive heart failure with cellulitis.  He is not running a fever he is not coughing not having any chest pain.   Past Medical History:  Diagnosis Date  . Arrhythmia   . Cellulitis   . Chronic atrial fibrillation    a. not on long term anticoagulation  . Chronic systolic CHF (congestive heart failure) ([place])   . Pneumonia 2011    Patient Active Problem List   Diagnosis Date Noted  . Chronic renal insufficiency 07/19/2015  . Mild cognitive impairment 07/06/2015  . NSVT (nonsustained ventricular tachycardia) ([place])   . Liver disease   . Hyperkalemia 06/10/2015  . Ascites 06/10/2015  . Coagulopathy ([place]) 06/10/2015  . Chronic systolic heart failure ([place]) 05/11/2015  . Essential hypertension 05/11/2015  . Bilateral edema of lower extremity   . Atrial fibrillation, chronic 04/07/2015  . Stasis ulcer of lower extremity ([place]) 04/07/2015    Past Surgical History:  Procedure Laterality Date  . PERIPHERAL VASCULAR CATHETERIZATION N/A 06/27/2015   Procedure: Dialysis/Perma Catheter Insertion;  Surgeon: Algernon Huxley, MD;  Location: [facility];  Service: Cardiovascular;  Laterality: N/A;  . PERIPHERAL VASCULAR CATHETERIZATION  Right 07/05/2015   Procedure: Dialysis/Perma Catheter Removal;  Surgeon: Algernon Huxley, MD;  Location: [facility];  Service: Cardiovascular;  Laterality: Right;    Prior to Admission medications   Medication Sig Start Date End Date Taking? Authorizing Provider  aspirin EC 81 MG tablet Take 81 mg by mouth daily.    [provider]  furosemide (LASIX) 20 MG tablet Take 1 tablet (20 mg total) by mouth daily. Patient not taking: Reported on 04/02/2016 07/12/15   Gladstone Lighter, MD  potassium chloride (K-DUR) 10 MEQ tablet Take 1 tablet (10 mEq total) by mouth daily. Patient not taking: Reported on 04/02/2016 07/12/15   Gladstone Lighter, MD  tamsulosin (FLOMAX) 0.4 MG CAPS capsule Take 1 capsule (0.4 mg total) by mouth daily. Patient not taking: Reported on 04/02/2016 07/12/15   Gladstone Lighter, MD    Allergies Penicillins  Family History  Problem Relation Age of Onset  . Diabetes Mother   . Stroke Mother   . Heart failure Father     Social History Social History   Tobacco Use  . Smoking status: Never Smoker  . Smokeless tobacco: Never Used  Substance Use Topics  . Alcohol use: No  . Drug use: No    Review of Systems  Constitutional: No fever/chills Eyes: No visual changes. ENT: No sore throat. Cardiovascular: Denies chest pain. Respiratory:  shortness of breath. Gastrointestinal: No abdominal pain.  No nausea, no vomiting.  No diarrhea.  No constipation. Genitourinary: Negative for  dysuria. Musculoskeletal: Negative for back pain. Skin: Negative for rash. Neurological: Negative for headaches, focal weakness  ____________________________________________   PHYSICAL EXAM:  VITAL SIGNS: ED Triage Vitals  Enc Vitals Group     BP 09/05/18 1320 (!) 177/89     Pulse Rate 09/05/18 1320 85     Resp 09/05/18 1320 (!) 22     Temp 09/05/18 1320 98.8 F (37.1 C)     Temp Source 09/05/18 1329 Oral     SpO2 09/05/18 1320 94 %     Weight 09/05/18 1319 190 lb  (86.2 kg)     Height 09/05/18 1319 5\' 8"  (1.727 m)     Head Circumference --      Peak Flow --      Pain Score 09/05/18 1330 0     Pain Loc --      Pain Edu? --      Excl. in [place]? --     Constitutional: Alert and oriented. Well appearing and in no acute distress. Eyes: Conjunctivae are normal.  Head: Atraumatic. Nose: No congestion/rhinnorhea. Mouth/Throat: Mucous membranes are moist.  Oropharynx non-erythematous. Neck: No stridor.Cardiovascular: Normal rate, regular rhythm. Grossly normal heart sounds.  Good peripheral circulation. Respiratory: Normal respiratory effort.  No retractions. Lungs CTAB. Gastrointestinal: Soft and nontender. No distention. No abdominal bruits. No CVA tenderness. Musculoskeletal: No lower extremity tenderness 1+ edema. Neurologic:  Normal speech and language. No gross focal neurologic deficits are appreciated.  Skin:  Skin is warm, dry and intact. No rash noted. Psychiatric: Mood and affect are normal. Speech and behavior are normal.  ____________________________________________   LABS (all labs ordered are listed, but only abnormal results are displayed)  Labs Reviewed  CBC - Abnormal; Notable for the following components:      Result Value   WBC 2.3 (*)    RBC 3.10 (*)    Hemoglobin 9.3 (*)    HCT 30.5 (*)    RDW 16.5 (*)    Platelets 79 (*)    All other components within normal limits  BASIC METABOLIC PANEL - Abnormal; Notable for the following components:   Glucose, Bld 106 (*)    Creatinine, Ser 1.36 (*)    Calcium 8.6 (*)    GFR calc non Af Amer 53 (*)    All other components within normal limits  HEPATIC FUNCTION PANEL - Abnormal; Notable for the following components:   AST 13 (*)    Total Bilirubin 2.3 (*)    Bilirubin, Direct 0.5 (*)    Indirect Bilirubin 1.8 (*)    All other components within normal limits  BRAIN NATRIURETIC PEPTIDE - Abnormal; Notable for the following components:   B Natriuretic Peptide 813.0 (*)    All other  components within normal limits  TROPONIN I   ____________________________________________  EKG  EKG read interpreted by me shows normal sinus rhythm rate of 74 normal axis nonspecific ST-T wave changes ____________________________________________  RADIOLOGY  ED MD interpretation:   Official radiology report(s): Dg Chest 2 View  Result Date: 09/05/2018 CLINICAL DATA:  Patient with elevated blood pressure. EXAM: CHEST - 2 VIEW COMPARISON:  Chest radiograph 07/07/2015 FINDINGS: Stable cardiomegaly. Pulmonary vascular redistribution and bilateral interstitial pulmonary opacities. No pleural effusion or pneumothorax. Thoracic spine degenerative changes. IMPRESSION: Cardiomegaly, pulmonary vascular redistribution and mild interstitial edema. Electronically Signed   By: Lovey Newcomer M.D.   On: 09/05/2018 14:06    ____________________________________________   PROCEDURES  Procedure(s) performed:   Procedures  Critical Care performed:  ____________________________________________   INITIAL IMPRESSION / ASSESSMENT AND PLAN / ED COURSE When ambulated well after patient got Lasix patient's rate of breathing work of breathing went up and his O2 sat went down to 83.  We will get him in the hospital to treat his congestive heart failure.        ____________________________________________   FINAL CLINICAL IMPRESSION(S) / ED DIAGNOSES  Final diagnoses:  Systolic congestive heart failure, unspecified HF chronicity [facility])     ED Discharge Orders    None       Note:  This document was prepared using Dragon voice recognition software and may include unintentional dictation errors.    Nena Polio, MD 09/05/18 [phone]

## 2018-09-06 ENCOUNTER — Inpatient Hospital Stay (HOSPITAL_COMMUNITY)
Admit: 2018-09-06 | Discharge: 2018-09-06 | Disposition: A | Discharge status: Home / Self Care | Payer: Medicare Other | Attending: Internal Medicine | Admitting: Internal Medicine

## 2018-09-06 ENCOUNTER — Inpatient Hospital Stay: Payer: Medicare Other

## 2018-09-06 DIAGNOSIS — I509 Heart failure, unspecified: Secondary | ICD-10-CM

## 2018-09-06 DIAGNOSIS — I5023 Acute on chronic systolic (congestive) heart failure: Secondary | ICD-10-CM

## 2018-09-06 LAB — ECHOCARDIOGRAM COMPLETE
HEIGHTINCHES: 68 in
WEIGHTICAEL: 3693.15 [oz_av]

## 2018-09-06 LAB — BASIC METABOLIC PANEL
ANION GAP: 8 (ref 5–15)
BUN: 21 mg/dL (ref 8–23)
CALCIUM: 8.8 mg/dL — AB (ref 8.9–10.3)
CO2: 24 mmol/L (ref 22–32)
CREATININE: 1.32 mg/dL — AB (ref 0.61–1.24)
Chloride: 107 mmol/L (ref 98–111)
GFR, EST NON AFRICAN AMERICAN: 55 mL/min — AB (ref >60)
Glucose, Bld: 103 mg/dL — ABNORMAL HIGH (ref 70–99)
Potassium: 3.9 mmol/L (ref 3.5–5.1)
Sodium: 139 mmol/L (ref 135–145)

## 2018-09-06 LAB — CBC
HCT: 30.4 % — ABNORMAL LOW (ref 39.0–52.0)
Hemoglobin: 9.3 g/dL — ABNORMAL LOW (ref 13.0–17.0)
MCH: 29.9 pg (ref 26.0–34.0)
MCHC: 30.6 g/dL (ref 30.0–36.0)
MCV: 97.7 fL (ref 80.0–100.0)
NRBC: 0 % (ref 0.0–0.2)
PLATELETS: 92 10*3/uL — AB (ref 150–400)
RBC: 3.11 MIL/uL — AB (ref 4.22–5.81)
RDW: 16 % — AB (ref 11.5–15.5)
WBC: 2.1 10*3/uL — ABNORMAL LOW (ref 4.0–10.5)

## 2018-09-06 MED ORDER — CARVEDILOL 6.25 MG PO TABS: 6.2500 mg | ORAL_TABLET | Freq: Two times a day (BID) | ORAL | Status: DC

## 2018-09-06 NOTE — Progress Notes (Signed)
Walked down to Medical Records in an attempt to find Dr. Manuella Ghazi, didn't locate. Patient wants to leave A.M.A. Will have patient sign form and take out all PIV's at this time. Wenda Low [facility]

## 2018-09-06 NOTE — Consult Note (Signed)
Cardiology Consultation:   Patient ID: Aaron Harrison MRN: [id number]; DOB: October 12, 1951  Admit date: 09/05/2018 Date of Consult: 09/06/2018  Primary Care Provider: Donnie Coffin, MD Primary Cardiologist: Fletcher Anon but has not seen since 2016 Primary Electrophysiologist:  None    Patient Profile:   Aaron Harrison is a 67 y.o. male with a hx of chronic systolic heart failure and paroxysmal atrial fibrillation who is being seen today for the evaluation of congestive heart failure at the request of Dr. Manuella Harrison.  History of Present Illness:   Aaron Harrison is a 67 year old male with known history of paroxysmal atrial fibrillation not on long-term anticoagulation due to refusal, chronic systolic heart failure, hypertension and previous lower extremity cellulitis. The patient was hospitalized in 2016 with acute respiratory failure in the setting of pneumonia and acute on chronic systolic heart failure.  He had atrial fibrillation with rapid ventricular response.  He developed multiorgan failure at that time and required temporary dialysis.  Echocardiogram during that admission showed an EF of 30 to 35%. Unfortunately, the patient has history of poor compliance with medications and he has refused heart failure medications in the past in addition to anticoagulation.  He also stopped going to the heart failure clinic and has not been seen since 2016. He presented with worsening shortness of breath after overexertion with no chest discomfort.  He was noted to have mildly elevated BNP in the 800 range.  Chest x-ray showed mild pulmonary vascular congestion.  The patient was admitted for diuresis although he initially refused as he does not want to take any diuretics.  He reports frequent urination at home.The patient did receive IV furosemide and he reports improvement in shortness of breath. He is noted to be in sinus rhythm.  He only takes aspirin at home.  Past Medical History:  Diagnosis Date  . Arrhythmia     . Cellulitis   . Chronic atrial fibrillation    a. not on long term anticoagulation  . Chronic systolic CHF (congestive heart failure) ([place])   . Pneumonia 2011    Past Surgical History:  Procedure Laterality Date  . PERIPHERAL VASCULAR CATHETERIZATION N/A 06/27/2015   Procedure: Dialysis/Perma Catheter Insertion;  Surgeon: Algernon Huxley, MD;  Location: [facility];  Service: Cardiovascular;  Laterality: N/A;  . PERIPHERAL VASCULAR CATHETERIZATION Right 07/05/2015   Procedure: Dialysis/Perma Catheter Removal;  Surgeon: Algernon Huxley, MD;  Location: [facility];  Service: Cardiovascular;  Laterality: Right;     Home Medications:  Prior to Admission medications   Medication Sig Start Date End Date Taking? Authorizing Provider  aspirin EC 81 MG tablet Take 81 mg by mouth daily.   Yes [provider]  furosemide (LASIX) 20 MG tablet Take 1 tablet (20 mg total) by mouth daily. Patient not taking: Reported on 04/02/2016 07/12/15   Gladstone Lighter, MD  tamsulosin (FLOMAX) 0.4 MG CAPS capsule Take 1 capsule (0.4 mg total) by mouth daily. Patient not taking: Reported on 04/02/2016 07/12/15   Gladstone Lighter, MD    Inpatient Medications: Scheduled Meds: . aspirin EC  81 mg Oral Daily  . furosemide  20 mg Intravenous Q8H  . heparin  5,000 Units Subcutaneous Q8H  . metoprolol tartrate  25 mg Oral BID  . tamsulosin  0.4 mg Oral Daily   Continuous Infusions:  PRN Meds: docusate sodium  Allergies:    Allergies  Allergen Reactions  . Penicillins Nausea And Vomiting    Social History:  Social History   Socioeconomic History  . Marital status: Single    Spouse name: Not on file  . Number of children: Not on file  . Years of education: Not on file  . Highest education level: Not on file  Occupational History  . Not on file  Social Needs  . Financial resource strain: Not on file  . Food insecurity:    Worry: Not on file    Inability: Not on file  .  Transportation needs:    Medical: Not on file    Non-medical: Not on file  Tobacco Use  . Smoking status: Never Smoker  . Smokeless tobacco: Never Used  Substance and Sexual Activity  . Alcohol use: No  . Drug use: No  . Sexual activity: Not on file  Lifestyle  . Physical activity:    Days per week: Not on file    Minutes per session: Not on file  . Stress: Not on file  Relationships  . Social connections:    Talks on phone: Not on file    Gets together: Not on file    Attends religious service: Not on file    Active member of club or organization: Not on file    Attends meetings of clubs or organizations: Not on file    Relationship status: Not on file  . Intimate partner violence:    Fear of current or ex partner: Not on file    Emotionally abused: Not on file    Physically abused: Not on file    Forced sexual activity: Not on file  Other Topics Concern  . Not on file  Social History Narrative  . Not on file    Family History:    Family History  Problem Relation Age of Onset  . Diabetes Mother   . Stroke Mother   . Heart failure Father      ROS:  Please see the history of present illness.   All other ROS reviewed and negative.     Physical Exam/Data:   Vitals:   09/05/18 1956 09/05/18 2025 09/06/18 0500 09/06/18 0803  BP:  (!) 179/75  (!) 158/68  Pulse:  79  67  Resp:  18    Temp:  98.6 F (37 C)  98 F (36.7 C)  TempSrc:  Oral  Oral  SpO2: 95% 95%  98%  Weight:  107.4 kg 104.7 kg   Height:       No intake or output data in the 24 hours ending 09/06/18 0939 Filed Weights   09/05/18 1330 09/05/18 2025 09/06/18 0500  Weight: 86.2 kg 107.4 kg 104.7 kg   Body mass index is 35.1 kg/m.  General:  Well nourished, well developed, in no acute distress HEENT: normal Lymph: no adenopathy Neck: no JVD Endocrine:  No thryomegaly Vascular: No carotid bruits; FA pulses 2+ bilaterally without bruits  Cardiac:  normal S1, S2; RRR; no murmur  Lungs:  clear  to auscultation bilaterally, no wheezing, rhonchi or rales  Abd: soft, nontender, no hepatomegaly  Ext: no edema Musculoskeletal:  No deformities, BUE and BLE strength normal and equal Skin: warm and dry  Neuro:  CNs 2-12 intact, no focal abnormalities noted Psych:  Normal affect   EKG:  The EKG was personally reviewed and demonstrates: Normal sinus rhythm with minimal diffuse ST depression suggestive of ischemia Telemetry:  Telemetry was personally reviewed and demonstrates: Normal sinus rhythm  Relevant CV Studies: Echocardiogram is pending. Previous echo in 2016 showed an  EF of 30 to 35%.  Laboratory Data:  Chemistry Recent Labs  Lab 09/05/18 1400 09/06/18 0430  NA 139 139  K 4.0 3.9  CL 108 107  CO2 23 24  GLUCOSE 106* 103*  BUN 20 21  CREATININE 1.36* 1.32*  CALCIUM 8.6* 8.8*  GFRNONAA 53* 55*  GFRAA >60 >60  ANIONGAP 8 8    Recent Labs  Lab 09/05/18 1400  PROT 7.2  ALBUMIN 3.8  AST 13*  ALT 9  ALKPHOS 62  BILITOT 2.3*   Hematology Recent Labs  Lab 09/05/18 1333 09/06/18 0430  WBC 2.3* 2.1*  RBC 3.10* 3.11*  HGB 9.3* 9.3*  HCT 30.5* 30.4*  MCV 98.4 97.7  MCH 30.0 29.9  MCHC 30.5 30.6  RDW 16.5* 16.0*  PLT 79* 92*   Cardiac Enzymes Recent Labs  Lab 09/05/18 1400  TROPONINI <0.03   No results for input(s): TROPIPOC in the last 168 hours.  BNP Recent Labs  Lab 09/05/18 1500  BNP 813.0*    DDimer No results for input(s): DDIMER in the last 168 hours.  Radiology/Studies:  Dg Chest 2 View  Result Date: 09/06/2018 CLINICAL DATA:  Shortness of breath. EXAM: CHEST - 2 VIEW COMPARISON:  09/05/2018. FINDINGS: Trachea is midline. Heart is enlarged. Mild interstitial prominence and indistinctness, similar to yesterday's exam. No pleural fluid. Flowing anterior osteophytosis in the thoracic spine. IMPRESSION: Mild edema, stable. Electronically Signed   By: Lorin Picket M.D.   On: 09/06/2018 07:43   Dg Chest 2 View  Result Date:  09/05/2018 CLINICAL DATA:  Patient with elevated blood pressure. EXAM: CHEST - 2 VIEW COMPARISON:  Chest radiograph 07/07/2015 FINDINGS: Stable cardiomegaly. Pulmonary vascular redistribution and bilateral interstitial pulmonary opacities. No pleural effusion or pneumothorax. Thoracic spine degenerative changes. IMPRESSION: Cardiomegaly, pulmonary vascular redistribution and mild interstitial edema. Electronically Signed   By: Lovey Newcomer M.D.   On: 09/05/2018 14:06    Assessment and Plan:   1. Acute on chronic systolic heart failure: The patient appears to be only mildly volume overloaded.  I agree with gentle diuresis although the patient has been resisting this due to frequent urination.  I agree with repeat echocardiogram to evaluate his ejection fraction..  The patient was started on metoprolol which should be changed to Toprol or Coreg depending on his ejection fraction.  We will likely add an ACE inhibitor/ARB or Entresto depending on his ejection fraction although compliance has been an issue in the past. 2. Paroxysmal atrial fibrillation: Fortunately, the patient is in normal sinus rhythm.  Chads vas score is 3 and thus ideally he should be on long-term anticoagulation.  However, he does have underlying thrombocytopenia with platelet count below 100,000.  Due to that in addition to compliance issues, I do not recommend initiating anticoagulation and this can be discussed further in the outpatient setting once the patient is more compliant. 3. Essential hypertension: Blood pressure is elevated.  Metoprolol was started.  Additional therapy is likely needed as outlined above.      For questions or updates, please contact [place] Please consult [email or web address] for contact info under     Signed, Kathlyn Sacramento, MD  09/06/2018 9:39 AM

## 2018-09-06 NOTE — Plan of Care (Signed)
  Problem: Health Behavior/Discharge Planning: Goal: Ability to manage health-related needs will improve Outcome: Not Progressing Note:  Patient refusing both IV Lasix and Sub Q Heparin today for 2 PM doses. Physician made aware via text page. Will continue to monitor for medication compliance. Aaron Harrison [facility]

## 2018-09-06 NOTE — Progress Notes (Signed)
[facility] [place] NAME: Aaron Harrison    MR#:  [id number]  DATE OF BIRTH:  06-15-1951  SUBJECTIVE:  CHIEF COMPLAINT:   Chief Complaint  Patient presents with  . Shortness of Breath  very fixated and adamant about his care. Wouldn't want Lasix or any other Diuretics, also refusing Heparin, c/o some lab bills from [facility] in past when I mentioned about Urology eval.  REVIEW OF SYSTEMS:  Review of Systems  Constitutional: Negative for chills, fever and weight loss.  HENT: Negative for nosebleeds and sore throat.   Eyes: Negative for blurred vision.  Respiratory: Positive for shortness of breath. Negative for cough and wheezing.   Cardiovascular: Negative for chest pain, orthopnea, leg swelling and PND.  Gastrointestinal: Negative for abdominal pain, constipation, diarrhea, heartburn, nausea and vomiting.  Genitourinary: Positive for frequency and urgency. Negative for dysuria.  Musculoskeletal: Negative for back pain.  Skin: Negative for rash.  Neurological: Negative for dizziness, speech change, focal weakness and headaches.  Endo/Heme/Allergies: Does not bruise/bleed easily.  Psychiatric/Behavioral: Negative for depression.    DRUG ALLERGIES:   Allergies  Allergen Reactions  . Penicillins Nausea And Vomiting   VITALS:  Blood pressure (!) 158/68, pulse 67, temperature 98 F (36.7 C), temperature source Oral, resp. rate 18, height 5\' 8"  (1.727 m), weight 104.7 kg, SpO2 98 %. PHYSICAL EXAMINATION:  Physical Exam  Constitutional: He is oriented to person, place, and time.  HENT:  Head: Normocephalic and atraumatic.  Eyes: Pupils are equal, round, and reactive to light. Conjunctivae and EOM are normal.  Neck: Normal range of motion. Neck supple. No tracheal deviation present. No thyromegaly present.  Cardiovascular: Normal rate, regular rhythm and normal heart sounds.  Pulmonary/Chest: Effort normal and breath sounds  normal. No respiratory distress. He has no wheezes. He exhibits no tenderness.  Abdominal: Soft. Bowel sounds are normal. He exhibits no distension. There is no tenderness.  Musculoskeletal: Normal range of motion.  Neurological: He is alert and oriented to person, place, and time. No cranial nerve deficit.  Skin: Skin is warm and dry. No rash noted.   LABORATORY PANEL:  Male CBC Recent Labs  Lab 09/06/18 0430  WBC 2.1*  HGB 9.3*  HCT 30.4*  PLT 92*   ------------------------------------------------------------------------------------------------------------------ Chemistries  Recent Labs  Lab 09/05/18 1400 09/06/18 0430  NA 139 139  K 4.0 3.9  CL 108 107  CO2 23 24  GLUCOSE 106* 103*  BUN 20 21  CREATININE 1.36* 1.32*  CALCIUM 8.6* 8.8*  AST 13*  --   ALT 9  --   ALKPHOS 62  --   BILITOT 2.3*  --    RADIOLOGY:  Dg Chest 2 View  Result Date: 09/06/2018 CLINICAL DATA:  Shortness of breath. EXAM: CHEST - 2 VIEW COMPARISON:  09/05/2018. FINDINGS: Trachea is midline. Heart is enlarged. Mild interstitial prominence and indistinctness, similar to yesterday's exam. No pleural fluid. Flowing anterior osteophytosis in the thoracic spine. IMPRESSION: Mild edema, stable. Electronically Signed   By: Lorin Picket M.D.   On: 09/06/2018 07:43   ASSESSMENT AND PLAN:   *Acute on chronic systolic congestive heart failure Ejection fraction per echocardiogram 3 years ago was 35%. He will need to follow-up with CHF clinic. - pending repeat echocardiogram. - intake and output measurement. Daily weights - patient is very adamant in not wanting to take any diuretics due to his urinary incontinence  * urinary incontinence and urge incontinence - on flomax.  outpt Urology eval (d/w Dr Erlene Quan)  *Chronic kidney disease stage III - stable  *Hypertension - change metoprolol to coreg per cardio  * Aemia of CKD. Continue to monitor.   He would like to go home tomorrow afternoon.  His room smells like Urine. I offered him discharge if he's not willing to let us manage his clinical condition but he also refused and says he already planned for tomorrow afternoon so that's the only way he will leave.   All the records are reviewed and case discussed with Care Management/Social Worker. Management plans discussed with the patient, cardio and they are in agreement.  CODE STATUS: DNR  TOTAL TIME TAKING CARE OF THIS PATIENT: 35 minutes.   More than 50% of the time was spent in counseling/coordination of care: YES  POSSIBLE D/C IN 1 DAYS, DEPENDING ON CLINICAL CONDITION.   Max Sane M.D on 09/06/2018 at 2:14 PM  Between 7am to 6pm - Pager - [phone]  After 6pm go to [email or web address] - Technical brewer [facility]  Office  [phone]  CC: Primary care physician; Aaron Coffin, MD  Note: This dictation was prepared with Dragon dictation along with smaller phrase technology. Any transcriptional errors that result from this process are unintentional.

## 2018-09-06 NOTE — Progress Notes (Addendum)
Cardiovascular and Pulmonary Nurse Navigator Note:    67 year old male with hx of chronic systolic heart failure and paroxysmal atrial fibrillation.  EF three years ago was 35%.  EF from echo today was 50-55%.  Patient  admitted with dx of acute on chronic systolic CHF, urinary incontinence and urge incontinence, CKD III, HTN,  Anemia of CKD.    CHF Education completed with patient.  Patient sitting up on side of bed at end of bed.  Patient anxious to go home and does not want to be on Lasix any longer.  Patient's RN, Richardson Landry is paging Dr. Carlynn Spry to let him know the patient is wanting to be discharged today after-all.    Provided patient with "Living Better with Heart Failure" packet. Briefly reviewed definition of heart failure and signs and symptoms of an exacerbation.?Explained to patient that HF is a chronic illness which requires self-assessment / self-management along with help from the cardiologist/PCP.?? ? *Reviewed importance of and reason behind checking weight daily in the AM, after using the bathroom, but before getting dressed. Patient does not have scales, but states he will purchase.   ? *Reviewed with patient the following information: *Discussed when to call the Dr= weight gain of >2-3lb overnight of 5lb in a week,  *Discussed yellow zone= call MD: weight gain of >2-3lb overnight of 5lb in a week, increased swelling, increased SOB when lying down, chest discomfort, dizziness, increased fatigue *Red Zone= call 911: struggle to breath, fainting or near fainting, significant chest pain   *Reviewed low sodium diet-provided handout of recommended and not recommended foods.  Patient informed this RN that he loves salt.  He further explained he likes to eat a bag of chips with iced tea while he watches TV. ? *Discussed fluid intake with patient as well. Patient not currently on a fluid restriction, but advised no more than 8-8 ounces glass of fluids per day.? ? *Instructed patient to take  medications as prescribed for heart failure. Explained briefly why pt is on the medications (either make you feel better, live longer or keep you out of the hospital) and discussed monitoring and side effects. As indicated above, patient does not want to take Lasix at all.  Patient has urinary incontinence and is stating he does not wish to take any diuretics due to urinary incontinence.  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   ? *Discussed exercise / activity.  Patient encouraged to remain as active as possible.   ? *Smoking Cessation- Patient is a NEVER smoker.  ? ? *[facility] - Explained the purpose of the HF Clinic. Explained to patient the HF Clinic does not replace PCP nor Cardiologist, but is an additional resource to helping patient manage heart failure at home.  Patient has been seen in the [facility] in the past. Patient informed this RN that he sees Darylene Price, FNP every 6 months.  According to [facility] the last attended appointment was 04/02/2016.   Follow-up appointment in [facility] is scheduled for 09/14/2018 at 9:20 a.m.   ? Again, the 5 Steps to Living Better with Heart Failure were reviewed with patient.  ? Patient thanked me for providing the above information. ? ? Roanna Epley, RN, BSN, [facility]? [place]  Cardiac &?Pulmonary Rehab  Cardiovascular &?Pulmonary Nurse Navigator  Direct Line: [phone]  Department Phone #: [phone] Fax: [phone]? Email Address: [email or web address]

## 2018-09-06 NOTE — Progress Notes (Signed)
Patient claims to see bugs down by the footboard of his room on the L hand side. I took a look at that area, I see no bugs...just some black residue. Still waiting for Dr. Manuella Ghazi to call back to the unit r/t patient request to speak to him from earlier. Will continue to monitor. Wenda Low [facility]

## 2018-09-06 NOTE — Progress Notes (Signed)
*  PRELIMINARY RESULTS* Echocardiogram 2D Echocardiogram has been performed.  Aaron Harrison 09/06/2018, 1:47 PM

## 2018-09-06 NOTE — Care Management Note (Signed)
Case Management Note  Patient Details  Name: Aaron Harrison MRN: [id number] Date of Birth: 25-Sep-1951  Subjective/Objective:        Patient is from home alone.  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  Has a history of A-fib and CHF.  Only home medication is a baby aspirin.  Patient states he was on lasix ant one time but has severe bladder control issues and stopped taking it. He is currently on IV lasix for acute on chronic CHF.  He was found by a friend to have some confusion and SOB.  He states he has a healthcare POA who brought him to the ED.  He states he wet the bed last night and is very unhappy about having to take lasix.  He states he wets himself every time he moves.  Per patient he has never seen a urologist.  Notified Dr. Manuella Ghazi.   Denies difficulties obtaining medications or with transportation.  Currently on room air.  Does not have a functioning scale at home but states he can buy one.  Will continue to monitor as patient progresses.            Action/Plan:   Expected Discharge Date:                  Expected Discharge Plan:  Home/Self Care  In-House Referral:     Discharge planning Services  CM Consult  Post Acute Care Choice:    Choice offered to:     DME Arranged:    DME Agency:     HH Arranged:    HH Agency:     Status of Service:  Completed, signed off  If discussed at [employer] of Stay Meetings, dates discussed:    Additional Comments:  Elza Rafter, RN 09/06/2018, 9:07 AM

## 2018-09-06 NOTE — Progress Notes (Signed)
Paged Dr. Manuella Ghazi again regarding patient wanting to speak w/ him r/t medications, etc. No response as of yet. Will inform patient. Patient may leave [facility] Wenda Low [facility]

## 2018-09-07 LAB — HIV ANTIBODY (ROUTINE TESTING W REFLEX): HIV Screen 4th Generation wRfx: NONREACTIVE

## 2018-09-09 ENCOUNTER — Other Ambulatory Visit: Payer: Self-pay

## 2018-09-09 ENCOUNTER — Emergency Department: Payer: Medicare Other

## 2018-09-09 ENCOUNTER — Observation Stay (EMERGENCY_DEPARTMENT_HOSPITAL)
Admission: EM | Admit: 2018-09-09 | Discharge: 2018-09-11 | Disposition: A | Discharge status: Home / Self Care | Payer: Medicare Other | Source: Home / Self Care | Attending: Emergency Medicine | Admitting: Emergency Medicine

## 2018-09-09 DIAGNOSIS — N179 Acute kidney failure, unspecified: Secondary | ICD-10-CM

## 2018-09-09 DIAGNOSIS — I5023 Acute on chronic systolic (congestive) heart failure: Secondary | ICD-10-CM | POA: Diagnosis present

## 2018-09-09 DIAGNOSIS — R296 Repeated falls: Secondary | ICD-10-CM

## 2018-09-09 DIAGNOSIS — M549 Dorsalgia, unspecified: Secondary | ICD-10-CM | POA: Diagnosis present

## 2018-09-09 DIAGNOSIS — W19XXXA Unspecified fall, initial encounter: Secondary | ICD-10-CM

## 2018-09-09 DIAGNOSIS — N3001 Acute cystitis with hematuria: Secondary | ICD-10-CM

## 2018-09-09 DIAGNOSIS — E860 Dehydration: Secondary | ICD-10-CM

## 2018-09-09 DIAGNOSIS — R531 Weakness: Secondary | ICD-10-CM

## 2018-09-09 LAB — CBC
HCT: 29.2 % — ABNORMAL LOW (ref 39.0–52.0)
Hemoglobin: 9 g/dL — ABNORMAL LOW (ref 13.0–17.0)
MCH: 30.5 pg (ref 26.0–34.0)
MCHC: 30.8 g/dL (ref 30.0–36.0)
MCV: 99 fL (ref 80.0–100.0)
PLATELETS: 94 10*3/uL — AB (ref 150–400)
RBC: 2.95 MIL/uL — ABNORMAL LOW (ref 4.22–5.81)
RDW: 16.7 % — AB (ref 11.5–15.5)
WBC: 2.1 10*3/uL — ABNORMAL LOW (ref 4.0–10.5)
nRBC: 0 % (ref 0.0–0.2)

## 2018-09-09 LAB — URINALYSIS, COMPLETE (UACMP) WITH MICROSCOPIC
Bilirubin Urine: NEGATIVE
Glucose, UA: NEGATIVE mg/dL
KETONES UR: 5 mg/dL — AB
Nitrite: NEGATIVE
PH: 5 (ref 5.0–8.0)
Protein, ur: NEGATIVE mg/dL
SPECIFIC GRAVITY, URINE: 1.017 (ref 1.005–1.030)

## 2018-09-09 LAB — BASIC METABOLIC PANEL
Anion gap: 8 (ref 5–15)
BUN: 29 mg/dL — ABNORMAL HIGH (ref 8–23)
CALCIUM: 8.6 mg/dL — AB (ref 8.9–10.3)
CO2: 24 mmol/L (ref 22–32)
CREATININE: 1.76 mg/dL — AB (ref 0.61–1.24)
Chloride: 108 mmol/L (ref 98–111)
GFR calc non Af Amer: 39 mL/min — ABNORMAL LOW (ref >60)
GFR, EST AFRICAN AMERICAN: 45 mL/min — AB (ref >60)
GLUCOSE: 105 mg/dL — AB (ref 70–99)
Potassium: 3.9 mmol/L (ref 3.5–5.1)
Sodium: 140 mmol/L (ref 135–145)

## 2018-09-09 LAB — TROPONIN I

## 2018-09-09 LAB — AMMONIA: Ammonia: 30 umol/L (ref 9–35)

## 2018-09-09 MED ORDER — SODIUM CHLORIDE 0.9 % IV BOLUS: 500.0000 mL | Freq: Once | INTRAVENOUS | Status: DC

## 2018-09-09 MED ORDER — SODIUM CHLORIDE 0.9 % IV BOLUS
1000.0000 mL | Freq: Once | INTRAVENOUS | Status: AC
  Administered 2018-09-09: 1000 mL via INTRAVENOUS

## 2018-09-09 MED ORDER — ACETAMINOPHEN 500 MG PO TABS
1000.0000 mg | ORAL_TABLET | Freq: Once | ORAL | Status: AC
  Administered 2018-09-09: 1000 mg via ORAL
  Filled 2018-09-09: qty 2

## 2018-09-09 NOTE — ED Triage Notes (Signed)
Pt arrived via EMS from home c/o fall this morning and increased weakness.

## 2018-09-09 NOTE — ED Provider Notes (Signed)
[facility] Emergency Department Provider Note  ____________________________________________  Time seen: Approximately 7:41 PM  I have reviewed the triage vital signs and the nursing notes.   HISTORY  Chief Complaint Weakness   HPI Aaron Harrison is a 67 y.o. male with a history of cognitive impairment, CHF with EF of 50 to 55%, atrial fibrillation not on anticoagulation, liver disease who presents for generalized weakness. Patient recently admitted for CHF exacerbation and discharged 2 days ago after refusing IV Lasix and further management of his CHF.  He reports that since he went home he has been very weak and has sustained several falls.  Today he fell onto his buttock and is complaining of bilateral lower back pain at the level of his pelvic girdle.  He denies head trauma today but 2 days ago did fall and hit his head on the ground.  He is not on any blood thinners.  He denies any dizziness or syncopal events.  No chest pain or shortness of breath, no abdominal pain.  Patient does endorse bloody diarrhea which has been ongoing for several months on and off.  He denies dysuria or hematuria, fever or chills, URI symptoms.   Past Medical History:  Diagnosis Date  . Arrhythmia   . Cellulitis   . Chronic atrial fibrillation    a. not on long term anticoagulation  . Chronic systolic CHF (congestive heart failure) ([place])   . Pneumonia 2011    Patient Active Problem List   Diagnosis Date Noted  . Acute on chronic systolic (congestive) heart failure ([place]) 09/05/2018  . Chronic renal insufficiency 07/19/2015  . Mild cognitive impairment 07/06/2015  . NSVT (nonsustained ventricular tachycardia) ([place])   . Liver disease   . Hyperkalemia 06/10/2015  . Ascites 06/10/2015  . Coagulopathy ([place]) 06/10/2015  . Acute on chronic systolic CHF (congestive heart failure) ([place]) 05/11/2015  . Essential hypertension 05/11/2015  . Bilateral edema of lower extremity   .  Atrial fibrillation, chronic 04/07/2015  . Stasis ulcer of lower extremity ([place]) 04/07/2015    Past Surgical History:  Procedure Laterality Date  . PERIPHERAL VASCULAR CATHETERIZATION N/A 06/27/2015   Procedure: Dialysis/Perma Catheter Insertion;  Surgeon: Algernon Huxley, MD;  Location: [place] [facility];  Service: Cardiovascular;  Laterality: N/A;  . PERIPHERAL VASCULAR CATHETERIZATION Right 07/05/2015   Procedure: Dialysis/Perma Catheter Removal;  Surgeon: Algernon Huxley, MD;  Location: [place] [facility];  Service: Cardiovascular;  Laterality: Right;    Prior to Admission medications   Medication Sig Start Date End Date Taking? Authorizing Provider  aspirin EC 81 MG tablet Take 81 mg by mouth daily.    [provider]  furosemide (LASIX) 20 MG tablet Take 1 tablet (20 mg total) by mouth daily. Patient not taking: Reported on 04/02/2016 07/12/15   [place]ad[place] Lighter, MD  tamsulosin (FLOMAX) 0.4 MG CAPS capsule Take 1 capsule (0.4 mg total) by mouth daily. Patient not taking: Reported on 04/02/2016 07/12/15   Gladstone Lighter, MD    Allergies Penicillins  Family History  Problem Relation Age of Onset  . Diabetes Mother   . Stroke Mother   . Heart failure Father     Social History Social History   Tobacco Use  . Smoking status: Never Smoker  . Smokeless tobacco: Never Used  Substance Use Topics  . Alcohol use: No  . Drug use: No    Review of Systems Constitutional: Negative for fever. + generalized weakness Eyes: Negative for visual changes.  ENT: Negative for facial injury or neck injury Cardiovascular: Negative for chest injury. Respiratory: Negative for shortness of breath. Negative for chest wall injury. Gastrointestinal: Negative for abdominal pain or injury. Genitourinary: Negative for dysuria. Musculoskeletal: + back injury. No negative for arm or leg pain. Skin: Negative for laceration/abrasions. Neurological: Negative for head  injury.   ____________________________________________   PHYSICAL EXAM:  VITAL SIGNS: ED Triage Vitals  Enc Vitals Group     BP 09/09/18 1904 (!) 161/65     Pulse Rate 09/09/18 1904 83     Resp 09/09/18 1904 18     Temp 09/09/18 1904 97.7 F (36.5 C)     Temp Source 09/09/18 1904 Oral     SpO2 09/09/18 1903 94 %     Weight 09/09/18 1905 235 lb 7.2 oz (106.8 kg)     Height 09/09/18 1905 5\' 8"  (1.727 m)     Head Circumference --      Peak Flow --      Pain Score 09/09/18 1904 0     Pain Loc --      Pain Edu? --      Excl. in [place]? --    Full spinal precautions maintained throughout the trauma exam. Constitutional: Alert and oriented. No acute distress. Does not appear intoxicated. HEENT Head: Normocephalic and atraumatic. Face: No facial bony tenderness. Stable midface Ears: No hemotympanum bilaterally. No Battle sign Eyes: No eye injury. PERRL. No raccoon eyes Nose: Nontender. No epistaxis. No rhinorrhea Mouth/Throat: Mucous membranes are moist. No oropharyngeal blood. No dental injury. Airway patent without stridor. Normal voice. Neck: no C-collar in place. No midline c-spine tenderness.  Cardiovascular: Normal rate, regular rhythm. Normal and symmetric distal pulses are present in all extremities. Pulmonary/Chest: Chest wall is stable and nontender to palpation/compression. Normal respiratory effort. Breath sounds are normal. No crepitus.  Abdominal: Soft, nontender, non distended.  Musculoskeletal: Nontender with normal full range of motion in all extremities. No deformities. No thoracic or lumbar midline spinal tenderness. Pelvis is stable. Tender to palpation over the bilateral posterior pelvic girdle Skin: Skin is warm, dry and intact. No abrasions or contutions. Psychiatric: Speech and behavior are appropriate. Neurological: Normal speech and language. Moves all extremities to command. No gross focal neurologic deficits are appreciated.  Glascow Coma Score: 4 - Opens  eyes on own 6 - Follows simple motor commands 5 - Alert and oriented GCS: 15   ____________________________________________   LABS (all labs ordered are listed, but only abnormal results are displayed)  Labs Reviewed  BASIC METABOLIC PANEL - Abnormal; Notable for the following components:      Result Value   Glucose, Bld 105 (*)    BUN 29 (*)    Creatinine, Ser 1.76 (*)    Calcium 8.6 (*)    GFR calc non Af Amer 39 (*)    GFR calc Af Amer 45 (*)    All other components within normal limits  CBC - Abnormal; Notable for the following components:   WBC 2.1 (*)    RBC 2.95 (*)    Hemoglobin 9.0 (*)    HCT 29.2 (*)    RDW 16.7 (*)    Platelets 94 (*)    All other components within normal limits  TROPONIN I  AMMONIA  URINALYSIS, COMPLETE (UACMP) WITH MICROSCOPIC  CBG MONITORING, ED   ____________________________________________  EKG  ED ECG REPORT I, Rudene Re, the attending physician, personally viewed and interpreted this ECG.  Normal sinus rhythm, rate of 80,  prolonged QTC, normal QRS interval, borderline right axis deviation, frequent PVCs, no ST elevations or depressions. Unchanged from prior ____________________________________________  RADIOLOGY  I have personally reviewed the images performed during this visit and I agree with the Radiologist's read.   Interpretation by Radiologist:  Dg Pelvis 1-2 Views  Result Date: 09/09/2018 CLINICAL DATA:  Weakness after fall this morning. EXAM: PELVIS - 1-2 VIEW COMPARISON:  06/22/2015 FINDINGS: Moderate joint space narrowing of both hips. No pelvic fracture or diastasis. Proximal femora appear intact. No joint dislocations. Soft tissues are unremarkable. 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   On: 09/09/2018 20:10   Ct Head Wo Contrast  Result Date: 09/09/2018 CLINICAL DATA:  Patient fell this morning and has increased weakness.  EXAM: CT HEAD WITHOUT CONTRAST TECHNIQUE: Contiguous axial images were obtained from the base of the skull through the vertex without intravenous contrast. COMPARISON:  04/29/2011 FINDINGS: Brain: Chronic involutional changes of brain with microvascular ischemic disease. 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 No intra-axial mass, hemorrhage or midline shift. No large vascular territory infarct. Midline fourth ventricle basal cisterns without effacement. Intact brainstem and cerebellum. Remote right caudate lacunar infarct. Vascular: No hyperdense vessel sign. Atherosclerosis of the carotid siphons. Skull: Intact Sinuses/Orbits: Stigmata of chronic sinusitis with thickened maxillary sinus walls. Mild ethmoid sinus mucosal thickening. Polypoid soft tissue densities within the nasal passages may represent small nasal polyps. Intact orbits. Other: None IMPRESSION: Atrophy with chronic microvascular ischemia. No acute intracranial abnormality. Electronically Signed   By: Ashley Royalty M.D.   On: 09/09/2018 20:07     ____________________________________________   PROCEDURES  Procedure(s) performed: None Procedures Critical Care performed:  None ____________________________________________   INITIAL IMPRESSION / ASSESSMENT AND PLAN / ED COURSE  67 y.o. male with a history of cognitive impairment, CHF with EF of 50 to 55%, atrial fibrillation not on anticoagulation, liver disease who presents for generalized weakness and several falls since leaving the hospital 2 days ago.  Will check labs to rule out acute kidney injury, worsening anemia, dehydration, or electrolyte abnormalities as possible etiologies for patient's generalized weakness.  Will check ammonia level as patient has liver disease listed in his Epic. Patient denies h/o cirrhosis. No imaging suggestive of cirrhosis in the past. Will get head CT due to several falls to rule out intracranial injury. Will get pelvic XR.    Clinical Course as of Sep 10 2039  Thu Sep 09, 2018  2038 Labs showing stable anemia, thrombocytopenia, and leukopenia.  Slightly elevated creatinine of 1.76 (baseline is 1.3).  Patient's last echocardiogram 3 days ago showing normal EF therefore we will give a liter of normal saline.  Patient's healthcare power of attorney who is his best friend Gershon Mussel is now at the bedside.  He does not feel the patient is safe to go back home as he keeps falling at home.  Patient agrees that he does not feel safe going home.  At this time there is no indication for admission.  Will keep patient in the emergency room for social work evaluation in the morning for rehab placement since patient was recently admitted to the hospital.  Imaging studies showing no acute injuries from his falls.   [CV]    Clinical Course User Index [CV] Alfred Levins [place], MD     As part of my medical decision making, I reviewed the following data within the [facility] notes reviewed and  incorporated, Labs reviewed , EKG interpreted , Old EKG reviewed, Old chart reviewed, Radiograph reviewed , A consult was requested and obtained from this/these consultant(s) PT/SW, Notes from prior ED visits and [place] Controlled Substance Database    Pertinent labs & imaging results that were available during my care of the patient were reviewed by me and considered in my medical decision making (see chart for details).    ____________________________________________   FINAL CLINICAL IMPRESSION(S) / ED DIAGNOSES  Final diagnoses:  Generalized weakness  Recurrent falls  Dehydration      NEW MEDICATIONS STARTED DURING THIS VISIT:  ED Discharge Orders    None       Note:  This document was prepared using Dragon voice recognition software and may include unintentional dictation errors.    Rudene Re, MD 09/09/18 2045

## 2018-09-09 NOTE — ED Notes (Signed)
Rectal bleeding

## 2018-09-09 NOTE — ED Notes (Signed)
Patient transported to CT 

## 2018-09-10 ENCOUNTER — Other Ambulatory Visit: Payer: Self-pay

## 2018-09-10 ENCOUNTER — Observation Stay: Payer: Medicare Other

## 2018-09-10 DIAGNOSIS — M549 Dorsalgia, unspecified: Secondary | ICD-10-CM | POA: Diagnosis present

## 2018-09-10 DIAGNOSIS — W19XXXA Unspecified fall, initial encounter: Secondary | ICD-10-CM

## 2018-09-10 DIAGNOSIS — I5023 Acute on chronic systolic (congestive) heart failure: Secondary | ICD-10-CM

## 2018-09-10 DIAGNOSIS — W19XXXD Unspecified fall, subsequent encounter: Secondary | ICD-10-CM

## 2018-09-10 LAB — CREATININE, SERUM
CREATININE: 1.47 mg/dL — AB (ref 0.61–1.24)
GFR calc Af Amer: 56 mL/min — ABNORMAL LOW (ref >60)
GFR calc non Af Amer: 48 mL/min — ABNORMAL LOW (ref >60)

## 2018-09-10 MED ORDER — IBUPROFEN 600 MG PO TABS
600.0000 mg | ORAL_TABLET | Freq: Once | ORAL | Status: AC
  Administered 2018-09-10: 600 mg via ORAL
  Filled 2018-09-10: qty 1

## 2018-09-10 MED ORDER — FOSFOMYCIN TROMETHAMINE 3 G PO PACK
3.0000 g | PACK | Freq: Once | ORAL | Status: AC
  Administered 2018-09-10: 3 g via ORAL
  Filled 2018-09-10: qty 3

## 2018-09-10 MED ORDER — ONDANSETRON HCL 4 MG/2ML IJ SOLN: 4.0000 mg | Freq: Four times a day (QID) | INTRAMUSCULAR | Status: DC | PRN

## 2018-09-10 MED ORDER — HEPARIN SODIUM (PORCINE) 5000 UNIT/ML IJ SOLN: 5000.0000 [IU] | Freq: Three times a day (TID) | INTRAMUSCULAR | Status: DC

## 2018-09-10 MED ORDER — BISACODYL 5 MG PO TBEC: 5.0000 mg | DELAYED_RELEASE_TABLET | Freq: Every day | ORAL | Status: DC | PRN

## 2018-09-10 MED ORDER — CEPHALEXIN 500 MG PO CAPS
500.0000 mg | ORAL_CAPSULE | Freq: Once | ORAL | Status: AC
  Administered 2018-09-10: 500 mg via ORAL
  Filled 2018-09-10: qty 1

## 2018-09-10 MED ORDER — ACETAMINOPHEN 650 MG RE SUPP: 650.0000 mg | Freq: Four times a day (QID) | RECTAL | Status: DC | PRN

## 2018-09-10 MED ORDER — FUROSEMIDE 20 MG PO TABS
20.0000 mg | ORAL_TABLET | Freq: Every day | ORAL | Status: DC
  Administered 2018-09-10 – 2018-09-11 (×2): 20 mg via ORAL
  Filled 2018-09-10 (×2): qty 1

## 2018-09-10 MED ORDER — SODIUM CHLORIDE 0.9 % IV SOLN
1.0000 g | INTRAVENOUS | Status: DC
  Administered 2018-09-11: 1 g via INTRAVENOUS
  Filled 2018-09-10 (×2): qty 10

## 2018-09-10 MED ORDER — CARVEDILOL 6.25 MG PO TABS
6.2500 mg | ORAL_TABLET | Freq: Two times a day (BID) | ORAL | Status: DC
  Administered 2018-09-11: 6.25 mg via ORAL
  Filled 2018-09-10: qty 1

## 2018-09-10 MED ORDER — METOPROLOL TARTRATE 5 MG/5ML IV SOLN
2.5000 mg | INTRAVENOUS | Status: DC | PRN
  Administered 2018-09-10: 2.5 mg via INTRAVENOUS
  Filled 2018-09-10: qty 5

## 2018-09-10 MED ORDER — ONDANSETRON HCL 4 MG PO TABS: 4.0000 mg | ORAL_TABLET | Freq: Four times a day (QID) | ORAL | Status: DC | PRN

## 2018-09-10 MED ORDER — ACETAMINOPHEN 325 MG PO TABS: 650.0000 mg | ORAL_TABLET | Freq: Four times a day (QID) | ORAL | Status: DC | PRN

## 2018-09-10 MED ORDER — SENNOSIDES-DOCUSATE SODIUM 8.6-50 MG PO TABS: 1.0000 | ORAL_TABLET | Freq: Every evening | ORAL | Status: DC | PRN

## 2018-09-10 MED ORDER — MORPHINE SULFATE (PF) 4 MG/ML IV SOLN: 4.0000 mg | INTRAVENOUS | Status: DC | PRN

## 2018-09-10 MED ORDER — HYDROCODONE-ACETAMINOPHEN 5-325 MG PO TABS
1.0000 | ORAL_TABLET | ORAL | Status: DC | PRN
  Administered 2018-09-11: 2 via ORAL
  Filled 2018-09-10: qty 2

## 2018-09-10 MED ORDER — ALBUTEROL SULFATE (2.5 MG/3ML) 0.083% IN NEBU: 2.5000 mg | INHALATION_SOLUTION | RESPIRATORY_TRACT | Status: DC | PRN

## 2018-09-10 MED ORDER — ASPIRIN EC 81 MG PO TBEC
81.0000 mg | DELAYED_RELEASE_TABLET | Freq: Every day | ORAL | Status: DC
  Administered 2018-09-10 – 2018-09-11 (×2): 81 mg via ORAL
  Filled 2018-09-10 (×3): qty 1

## 2018-09-10 NOTE — Care Management Note (Signed)
Case Management Note  Patient Details  Name: Aaron Harrison MRN: [id number] Date of Birth: 30-Mar-1951  Subjective/Objective:      Patient was brought to the ED by EMS after a fall.  HCPOA called for EMS to bring pt to hospital.  Patient reports that he lives in a senior apartments- set up specifically for seniors- he reports there are side rails everywhere, the shower has rails and is easy to get in and out of.  Patient reports he has a rolling walker with front wheels at home, no other equipment.  Patient reports he is independent in ADL's and uses his rolling walker.  Patient does admit to falling twice recently once last night and then the day before.  Patient has a friend that is his [place].   RNCM spoke with [name] and he reports that the patient is unsafe to go home.  [name] states that when he went to the patient's home last night after the patient fell that he was filthy, smelled of urine, obviously not bathed.  He reports that there were no sheets on the bed just blankets and magazines.  [name] reports that the patient requested help with using the restroom but he could not get the patient to stand.  He even got help from a neighbor and neither of them could get the patient standing to use the restroom, that is when he called EMS.  [name] states the patient has suffered a recent significant decline in physical and mental health.   Plan to send patient home with home health services RN, PT, SW, and aide.  Patient offered choice of agency and chooses [facility].   RNCM talked with the HCPOA about the plan of care and he reports that if the patient is discharged he will call DSS to make an APS referral because the patient is not safe.  RNCM will re consult with ED MD, PT, and SW.  Doran Clay RN BSN [phone]                 Action/Plan:   Expected Discharge Date:                  Expected Discharge Plan:  [place]  In-House Referral:   Clinical Social Work  Discharge planning Services  CM Consult  Post Acute Care Choice:    Choice offered to:  Patient  DME Arranged:    DME Agency:     HH Arranged:  RN, PT, Nurse's Aide, Social Work [employer] Agency:  [place]  Status of Service:  Completed, signed off  If discussed at [employer], dates discussed:    Additional Comments:  Shelbie Hutching, RN 09/10/2018, 10:29 AM

## 2018-09-10 NOTE — Care Management Note (Addendum)
Case Management Note  Patient Details  Name: Aaron Harrison MRN: [id number] Date of Birth: 1951-06-14  Subjective/Objective:     RNCM continues to consult with SW and ED MD.  At this time [facility] and MD do not feel that the patient is safe to go home.  The patient cannot even stand by himself.  PT will come and evaluate as well as psychiatry.  HCPOA has been updated on plan of care at this time- RNCM expressed that if patient is competent and wants to go home that we will send him home.  Home health services have been set up with [place].    RNCM attempted to contact [facility] to schedule the patient an appointment with PCP- patient has not been to this clinic in 3 years, the next available new patient appointment is not until January and that schedule has not come out so unable to schedule appointment - the patient reported that he saw Dr. Clide Deutscher about a year ago this is not the case.  It seems that the patient has not seen a doctor in the last 2 years- last HF clinic visit is noted to be in 2017 per Epic.              Action/Plan:   Expected Discharge Date:                  Expected Discharge Plan:  [place]  In-House Referral:  Clinical Social Work  Discharge planning Services  CM Consult  Post Acute Care Choice:    Choice offered to:  Patient  DME Arranged:    DME Agency:     HH Arranged:  RN, PT, Nurse's Aide, Social Work [employer] Agency:  [facility]  Status of Service:  Completed, signed off  If discussed at [employer], dates discussed:    Additional Comments:  Shelbie Hutching, RN 09/10/2018, 2:34 PM

## 2018-09-10 NOTE — Progress Notes (Signed)
Clinical Education officer, museum (CSW) made an Adult Scientist, forensic (APS) report to [facility] so they can try to set up more services and assist with long term care medicaid application.   [employer], LCSW [phone]

## 2018-09-10 NOTE — ED Notes (Signed)
Pt given meal tray and water for lunch

## 2018-09-10 NOTE — ED Notes (Signed)
Caseworker at bedside. Pt is going to be sent home with POA and home health set up. Pt stating he "is ready to go." Case worker is currently trying to reach POA to explain plain and ride home.

## 2018-09-10 NOTE — ED Notes (Signed)
Pt resting in bed, equal rise and fall of chest noted. Lights dimmed for  Comfort. Side rails raised.

## 2018-09-10 NOTE — ED Notes (Signed)
RN entered room to update pt on status for psych consult. Pt found sitting on side of bed with pants unzipped and down. When attempting to assist pt to adjust pants, RN noticed pants extremely wet from zipper all the way down left leg. It appears pt was attempting to urinate and soiled clothing. Pt states "I spilled the cup of water. No water present on floor only pants were wet all the way down legs to shoes. Pt insisted on leaving his wet pants on, RN convinced pt to allow staff to place blue scrub pants on at this time. Pt unable to stand unassisted at this time. With two staff members at each side assisting pt to stand, pt pulling against staff and unable to maintain balance without falling back without heavy resistance from staff.

## 2018-09-10 NOTE — ED Notes (Signed)
When administering medication RN noticed strong odor of urine in room. Pt holding blanked only covering midsection. RN asked pt if he had gotten brief wet attempting to use urinal. Pt initially denied being wet. When checking pt brief, chuck and bed sheets were soiled. RN cleaned pt and changed brief and linens. Pt struggling to assist RN with rolling and repositioning in bed. Condom cath placed on pt to prevent pt from developing skin breakdown.

## 2018-09-10 NOTE — Progress Notes (Signed)
Pt refuses to have bed alarm set. And refuses shots in the stomach.  States that he will get a lawyer if we set his bed alarm.  RN Erline Levine present for this conversation.

## 2018-09-10 NOTE — Care Management Note (Signed)
Case Management Note  Patient Details  Name: Aaron Harrison MRN: [id number] Date of Birth: 08-03-1951  Subjective/Objective:       Psychiatry has evaluated patient and found him to be competent per the consult note: " Patient appears to value his independence to a very large degree.  In talking with the patient he spontaneously was able to acknowledge that a fall could result in a fatal outcome.  He was able to acknowledge that based on his situation he is at an elevated risk of falls.  Despite this the patient states that he is willing to take that risk rather than go to a rehab if it is too far away from home because he is afraid that he would not be able to regain his independent living.  Under the circumstances the patient meets criteria for having normal capacity to make this decision despite the fact that it is not the decision that the healthcare team would recommend.  No psychiatric illness identified no need for any treatment".    HCPOA is coming to speak with patient and reports that he will not help take the patient home because the patient is not safe and cannot take care of himself.  If the patient chooses to go home RNCM has arranged [facility] with [facility] for RN, PT, Aide, and SW .  The earliest [facility] can go out is this Sunday 11/17.  [facility] will arrange for a NP to see patient and write home health orders.  Dr. Doy Hutching has agreed to write orders until NP can see patient, 2-3 weeks. Sharmon Revere with [facility] aware of arrangement and will organize services.  Sharmon Revere tells RNCM to give the patient her number if he needs anything during the weekend she will go out to see him.  [phone] If patient chooses to go home instead of being admitted or placed in Observation patient will be discharged via EMS.   Patient cannot be placed in a SNF or long term care placement from the ED it is not covered by Medicare.  To qualify for SNF Medicare requires a medically necessary 3 night  inpatient stay.  Patient was recently admitted and left the hospital AMA.     RNCM signing off Doran Clay RN BSN [phone]      Action/Plan:   Expected Discharge Date:                  Expected Discharge Plan:  [place]  In-House Referral:  Clinical Social Work  Discharge planning Services  CM Consult  Post Acute Care Choice:    Choice offered to:  Patient  DME Arranged:    DME Agency:     HH Arranged:  RN, PT, Nurse's Aide, Social Work [employer] Agency:  [facility]  Status of Service:  Completed, signed off  If discussed at [employer], dates discussed:    Additional Comments:  Shelbie Hutching, RN 09/10/2018, 5:00 PM

## 2018-09-10 NOTE — Progress Notes (Signed)
Voice mail left for Aaron Harrison, per pt's request.

## 2018-09-10 NOTE — H&P (Signed)
[facility] NAME: Aaron Harrison    MR#:  [id number]  DATE OF BIRTH:  Mar 08, 1951  DATE OF ADMISSION:  09/09/2018  PRIMARY CARE PHYSICIAN: Donnie Coffin, MD   REQUESTING/REFERRING PHYSICIAN:   CHIEF COMPLAINT:   Chief Complaint  Patient presents with  . Weakness   Weakness and back pain. HISTORY OF PRESENT ILLNESS:  Aaron Harrison  is a 67 y.o. male with a known history of arrhythmia, cellulitis, chronic A. fib not on anticoagulation, chronic systolic CHF EF: 41% -   74%, CKD stage III and pneumonia.  The patient left AMA 4 days ago after admission for acute on chronic systolic CHF.  He has generalized weakness and fell multiple times at home.  He complains of back pain and is not able to get up.  He denies any headache, dizziness, no chest pain or shortness of breath.  Urinalysis show UTI.  He is treated with Rocephin in the ED.  Since patient has UTI, dehydration and cannot get up and has back pain, ED physician request admission.  I discussed with the ED physician, case manager and the social worker.  The plan is observation, pain control and discharge to home with home health tomorrow. PAST MEDICAL HISTORY:   Past Medical History:  Diagnosis Date  . Arrhythmia   . Cellulitis   . Chronic atrial fibrillation    a. not on long term anticoagulation  . Chronic systolic CHF (congestive heart failure) ([place])   . Pneumonia 2011    PAST SURGICAL HISTORY:   Past Surgical History:  Procedure Laterality Date  . PERIPHERAL VASCULAR CATHETERIZATION N/A 06/27/2015   Procedure: Dialysis/Perma Catheter Insertion;  Surgeon: Algernon Huxley, MD;  Location: [facility];  Service: Cardiovascular;  Laterality: N/A;  . PERIPHERAL VASCULAR CATHETERIZATION Right 07/05/2015   Procedure: Dialysis/Perma Catheter Removal;  Surgeon: Algernon Huxley, MD;  Location: [facility];  Service: Cardiovascular;  Laterality: Right;    SOCIAL HISTORY:    Social History   Tobacco Use  . Smoking status: Never Smoker  . Smokeless tobacco: Never Used  Substance Use Topics  . Alcohol use: No    FAMILY HISTORY:   Family History  Problem Relation Age of Onset  . Diabetes Mother   . Stroke Mother   . Heart failure Father     DRUG ALLERGIES:   Allergies  Allergen Reactions  . Penicillins Nausea And Vomiting    REVIEW OF SYSTEMS:   Review of Systems  Constitutional: Positive for malaise/fatigue. Negative for chills and fever.  HENT: Negative for sore throat.   Eyes: Negative for blurred vision and double vision.  Respiratory: Negative for cough, hemoptysis, shortness of breath, wheezing and stridor.   Cardiovascular: Negative for chest pain, palpitations, orthopnea and leg swelling.  Gastrointestinal: Negative for abdominal pain, blood in stool, diarrhea, melena, nausea and vomiting.  Genitourinary: Negative for dysuria, flank pain and hematuria.  Musculoskeletal: Positive for back pain and falls. Negative for joint pain.  Neurological: Negative for dizziness, sensory change, focal weakness, seizures, loss of consciousness, weakness and headaches.  Endo/Heme/Allergies: Negative for polydipsia.  Psychiatric/Behavioral: Negative for depression. The patient is not nervous/anxious.     MEDICATIONS AT HOME:   Prior to Admission medications   Medication Sig Start Date End Date Taking? Authorizing Provider  aspirin EC 81 MG tablet Take 81 mg by mouth daily.   Yes [provider]  furosemide (LASIX) 20 MG tablet  Take 1 tablet (20 mg total) by mouth daily. Patient not taking: Reported on 04/02/2016 07/12/15   Gladstone Lighter, MD  tamsulosin (FLOMAX) 0.4 MG CAPS capsule Take 1 capsule (0.4 mg total) by mouth daily. Patient not taking: Reported on 04/02/2016 07/12/15   Gladstone Lighter, MD      VITAL SIGNS:  Blood pressure (!) 160/123, pulse 83, temperature 98.8 F (37.1 C), temperature source Oral, resp. rate 16, height  5\' 8"  (1.727 m), weight 106.8 kg, SpO2 96 %.  PHYSICAL EXAMINATION:  Physical Exam  GENERAL:  67 y.o.-year-old patient lying in the bed with no acute distress.  Obesity. EYES: Pupils equal, round, reactive to light and accommodation. No scleral icterus. Extraocular muscles intact.  HEENT: Head atraumatic, normocephalic. Oropharynx and nasopharynx clear.  NECK:  Supple, no jugular venous distention. No thyroid enlargement, no tenderness.  LUNGS: Normal breath sounds bilaterally, no wheezing, rales,rhonchi or crepitation. No use of accessory muscles of respiration.  CARDIOVASCULAR: S1, S2 normal. No murmurs, rubs, or gallops.  ABDOMEN: Soft, nontender, nondistended. Bowel sounds present. No organomegaly or mass.  EXTREMITIES: No pedal edema, cyanosis, or clubbing.  Lower back tenderness. NEUROLOGIC: Cranial nerves II through XII are intact. Muscle strength 3-4/5 in all extremities. Sensation intact. Gait not checked.  PSYCHIATRIC: The patient is alert and oriented x 3.  SKIN: No obvious rash, lesion, or ulcer.  Bruises on arms.  LABORATORY PANEL:   CBC Recent Labs  Lab 09/09/18 1907  WBC 2.1*  HGB 9.0*  HCT 29.2*  PLT 94*   ------------------------------------------------------------------------------------------------------------------  Chemistries  Recent Labs  Lab 09/05/18 1400  09/09/18 1907  NA 139   < > 140  K 4.0   < > 3.9  CL 108   < > 108  CO2 23   < > 24  GLUCOSE 106*   < > 105*  BUN 20   < > 29*  CREATININE 1.36*   < > 1.76*  CALCIUM 8.6*   < > 8.6*  AST 13*  --   --   ALT 9  --   --   ALKPHOS 62  --   --   BILITOT 2.3*  --   --    < > = values in this interval not displayed.   ------------------------------------------------------------------------------------------------------------------  Cardiac Enzymes Recent Labs  Lab 09/09/18 1937  TROPONINI <0.03    ------------------------------------------------------------------------------------------------------------------  RADIOLOGY:  Dg Pelvis 1-2 Views  Result Date: 09/09/2018 CLINICAL DATA:  Weakness after fall this morning. EXAM: PELVIS - 1-2 VIEW COMPARISON:  06/22/2015 FINDINGS: Moderate joint space narrowing of both hips. No pelvic fracture or diastasis. Proximal femora appear intact. No joint dislocations. Soft tissues are unremarkable. 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   On: 09/09/2018 20:10   Ct Head Wo Contrast  Result Date: 09/09/2018 CLINICAL DATA:  Patient fell this morning and has increased weakness. EXAM: CT HEAD WITHOUT CONTRAST TECHNIQUE: Contiguous axial images were obtained from the base of the skull through the vertex without intravenous contrast. COMPARISON:  04/29/2011 FINDINGS: Brain: Chronic involutional changes of brain with microvascular ischemic disease. 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 No intra-axial mass, hemorrhage or midline shift. No large vascular territory infarct. Midline fourth ventricle basal cisterns without effacement. Intact brainstem and cerebellum. Remote right caudate lacunar infarct. Vascular: No hyperdense vessel sign. Atherosclerosis of the carotid siphons. Skull: Intact Sinuses/Orbits: Stigmata of chronic sinusitis with thickened maxillary sinus walls. Mild ethmoid sinus  mucosal thickening. Polypoid soft tissue densities within the nasal passages may represent small nasal polyps. Intact orbits. Other: None IMPRESSION: Atrophy with chronic microvascular ischemia. No acute intracranial abnormality. Electronically Signed   By: Ashley Royalty M.D.   On: 09/09/2018 20:07      IMPRESSION AND PLAN:   Back pain due to fall. The patient will be placed for observation.  Pain control.  X-ray of lumbar spine. Home health and PT is set up by case  manager.  Accelerated hypertension.  Continue Lasix, and Coreg twice daily, Lopressor IV PRN.  Pain control.  UTI.  Continue Rocephin and follow-up urine culture. CKD stage III.  Stable. Pancytopenia.  Unclear etiology.  Stable.  Follow-up with PCP and hematologist as outpatient. Chronic systolic CHF.  EF: 50% -   55%.  Stable, continue Lasix. Obesity.  Follow-up PCP. Discussed with case Freight forwarder and Education officer, museum. All the records are reviewed and case discussed with ED provider. Management plans discussed with the patient, family and they are in agreement.  CODE STATUS: DNR.  TOTAL TIME TAKING CARE OF THIS PATIENT: 55 minutes.    Demetrios Loll M.D on 09/10/2018 at 7:29 PM  Between 7am to 6pm - Pager - [phone]  After 6pm go to [email or web address] - [employer] [facility]  Office  [phone]  CC: Primary care physician; Donnie Coffin, MD   Note: This dictation was prepared with Dragon dictation along with smaller phrase technology. Any transcriptional errors that result from this process are unin

## 2018-09-10 NOTE — Evaluation (Signed)
Physical Therapy Evaluation Patient Details Name: Aaron Harrison MRN: [id number] DOB: 10/17/1951 Today's Date: 09/10/2018   History of Present Illness  67 y/o male who was here a few days ago and left AMA.  He now returns having falling 2X at home since 11/11.   history of cardiac arrhythmia, chronic atrial fibrillation, chronic systolic CHF with ejection fraction 35%, chronic kidney disease.  Clinical Impression  Pt struggled with PT exam and showed poor overall awareness and safety with mobility and ambulation. He was able to walk ~40 ft with walker, but was very unsafe and again lacked awareness or ability to make appropriate changes with cuing from PT.  He reports L hand weakness (less so in LE) is chronic and no issues with vision, swallowing, etc  But if pt is normally independent, driving, working, etc at baseline then he inherently is not at baseline.  Pt is completely unsafe to go home and if he were to do so he would need 24/7 direct supervision, a wheelchair and HHPT.  PT is recommending STR as this seems the only safe and logical disposition at this time.      Follow Up Recommendations SNF    Equipment Recommendations       Recommendations for Other Services       Precautions / Restrictions Precautions Precautions: Fall Restrictions Weight Bearing Restrictions: No      Mobility  Bed Mobility Overal bed mobility: Needs Assistance Bed Mobility: Supine to Sit;Sit to Supine     Supine to sit: Mod assist Sit to supine: Mod assist   General bed mobility comments: Pt showed effort in getting to EOB, unable to coordinate movements and inability to lift trunk to sitting, more assist needed to get back into bed (legs and upper body)  Transfers Overall transfer level: Needs assistance Equipment used: Rolling walker (2 wheeled) Transfers: Sit to/from Stand Sit to Stand: Mod assist         General transfer comment: Pt struggled coordinating getting to standing on all 3  attempts.  He needed heavy cuing for UE placement and phyiscal assist to shift weight forward.  Ambulation/Gait Ambulation/Gait assistance: Max assist Gait Distance (Feet): 40 Feet Assistive device: Rolling walker (2 wheeled)       General Gait Details: Pt able to take a few steps initially w/ only min assist and a lot of cuing.  However he quickly started veering to the L, showed poor awareness with AD crashing L wheel into multiple obstacles, and gradually increasing lean to the L that he was uanble to self correct.  PT on L side to assist with L UE use of handle and maintain safety but ultimately he became too unsafe and unable to correct (even with a lot of cuing) that we needed to sit for safety reasons.  Stairs            Wheelchair Mobility    Modified Rankin (Stroke Patients Only)       Balance Overall balance assessment: Needs assistance Sitting-balance support: Bilateral upper extremity supported Sitting balance-Leahy Scale: Fair Sitting balance - Comments: once assisted to upright he was able to maintain sitting balance w/o direct assist   Standing balance support: Bilateral upper extremity supported Standing balance-Leahy Scale: Poor Standing balance comment: Pt able to maintain static standing with walker, but showed very poor awareness (L UE and ability to shift weight back to R) and ultimately was unsafe in standing  Pertinent Vitals/Pain Pain Assessment: 0-10 Pain Score: 6  Pain Location: R hip/back    Home Living Family/patient expects to be discharged to:: Unsure                 Additional Comments: Pt lives at [facility] retirement apartments, states he normally drives, etc    Prior Function Level of Independence: Independent         Comments: per pt he is able to drive, run errands, 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     Hand Dominance        Extremity/Trunk Assessment   Upper Extremity  Assessment Upper Extremity Assessment: LUE deficits/detail;Generalized weakness(R UE grossly 4- to 4/5) LUE Deficits / Details: most significantly pt with inability to fully close L fist, compromised strength, shoulder/elbow 3-/5 LUE Coordination: decreased fine motor    Lower Extremity Assessment Lower Extremity Assessment: Generalized weakness(R grossly 4/5, L grossly 3+/5 )       Communication   Communication: No difficulties  Cognition Arousal/Alertness: Awake/alert Behavior During Therapy: Flat affect Overall Cognitive Status: Difficult to assess                                 General Comments: Pt reports that he essentially feels that he is near his baseline, however it he was indeed as independent as he indicated prior to last week he is very different now.  He regularly needed repeat cuing and at times direct physical assist to perform even very basic activities.  His stories seemed consistent, but then there were times where he offered confused, delayed or otherwise compromised conversation.      General Comments      Exercises     Assessment/Plan    PT Assessment Patient needs continued PT services  PT Problem List Decreased strength;Decreased range of motion;Decreased activity tolerance;Decreased balance;Decreased mobility;Decreased coordination;Decreased cognition;Decreased knowledge of use of DME;Decreased safety awareness;Decreased knowledge of precautions;Pain       PT Treatment Interventions DME instruction;Gait training;Stair training;Functional mobility training;Therapeutic activities;Therapeutic exercise;Balance training;Cognitive remediation;Neuromuscular re-education;Patient/family education    PT Goals (Current goals can be found in the Care Plan section)  Acute Rehab PT Goals Patient Stated Goal: get back to walking without a walker PT Goal Formulation: With patient Time For Goal Achievement: 09/24/18 Potential to Achieve Goals: Fair     Frequency Min 2X/week   Barriers to discharge        Co-evaluation               AM-PAC PT "6 Clicks" Daily Activity  Outcome Measure Difficulty turning over in bed (including adjusting bedclothes, sheets and blankets)?: Unable Difficulty moving from lying on back to sitting on the side of the bed? : Unable Difficulty sitting down on and standing up from a chair with arms (e.g., wheelchair, bedside commode, etc,.)?: Unable Help needed moving to and from a bed to chair (including a wheelchair)?: A Lot Help needed walking in hospital room?: Total Help needed climbing 3-5 steps with a railing? : Total 6 Click Score: 7    End of Session Equipment Utilized During Treatment: Gait belt Activity Tolerance: Patient limited by fatigue Patient left: in bed;with call bell/phone within reach Nurse Communication: Mobility status PT Visit Diagnosis: Muscle weakness (generalized) (M62.81);Unsteadiness on feet (R26.81);Difficulty in walking, not elsewhere classified (R26.2)    Time: 2952-8413 PT Time Calculation (min) (ACUTE ONLY): 50 min   Charges:   PT Evaluation $PT Eval  Low Complexity: 1 Low          Kreg Shropshire, DPT 09/10/2018, 4:12 PM

## 2018-09-10 NOTE — ED Notes (Signed)
Pt talking on cell phone at this time. No distress noted.

## 2018-09-10 NOTE — ED Notes (Signed)
Pt brought meal. Pt denying any further needs. Nurse had EVS empty pt's waste cans also.

## 2018-09-10 NOTE — ED Notes (Signed)
Pt requesting to use bedpan. Pt not able to roll from side to side in order to place bedpan underneath him. Pt was agreeable with brief being applied and that he would have to be cleaned if he became soiled.

## 2018-09-10 NOTE — Progress Notes (Signed)
Advanced Care Plan.  Purpose of Encounter: CODE STATUS. Parties in Attendance: Patient and me. Patient's Decisional Capacity: Yes. Medical Story: Aaron Harrison  is a 67 y.o. male with a known history of arrhythmia, cellulitis, chronic A. fib not on anticoagulation, chronic systolic CHF EF: 98% - 33%, CKD stage III and pneumonia.  The patient is being admitted for back pain, fall and UTI.  I discussed with patient about his current condition, prognosis and CODE STATUS.  The patient stated that he does not want to be resuscitated or intubated if he has cardiopulmonary arrest. Plan:  Code Status: DNR. Time spent discussing advance care planning: 17 minutes.

## 2018-09-10 NOTE — Consult Note (Signed)
[facility] Face-to-Face Psychiatry Consult   Reason for Consult: Consult for this 67 year old man in the hospital after a fall.  Concern about capacity to make decisions regarding where he will be discharged Referring Physician: Alfred Levins Patient Identification: BIENVENIDO PROEHL MRN:  [id number] Principal Diagnosis: Fall Diagnosis:   Patient Active Problem List   Diagnosis Date Noted  . Fall [W19.XXXA] 09/10/2018  . Acute on chronic systolic (congestive) heart failure ([place]) [I50.23] 09/05/2018  . Chronic renal insufficiency [N18.9] 07/19/2015  . Mild cognitive impairment [G31.84] 07/06/2015  . NSVT (nonsustained ventricular tachycardia) ([place]) [I47.2]   . Liver disease [K76.9]   . Hyperkalemia [E87.5] 06/10/2015  . Ascites [R18.8] 06/10/2015  . Coagulopathy ([place]) [D68.9] 06/10/2015  . Acute on chronic systolic CHF (congestive heart failure) ([place]) [I50.23] 05/11/2015  . Essential hypertension [I10] 05/11/2015  . Bilateral edema of lower extremity [R60.0]   . Atrial fibrillation, chronic [I48.20] 04/07/2015  . Stasis ulcer of lower extremity ([place]) [I83.009, L97.909] 04/07/2015    Total Time spent with patient: 1 hour  Subjective:   TREYVONNE TATA is a 67 y.o. male patient admitted with "I had a fall".  HPI: Patient seen chart reviewed.  This is a 67 year old man who came to the emergency room late last week after a fall.  He was briefly admitted to the hospital and then discharged.  Apparently he had another fall at home within a day after that and came back to the emergency room where he has been ever since.  Patient describes slightly different circumstances for both of these but in both cases it sounds clear that problems with his own strength and coordination resulted in the fall.  I did not watch him attempt to stand here in the emergency room but I am told by the emergency room staff that attempts to have him stand up and ambulate independently today have been a failure.  Patient required 4  people just to sit up in bed and was not able to stand up on his own.  Emergency room staff are recommending rehab discharge for the patient.  Patient is ambivalent about this.  He tells me that if it is close by he might be willing to consider it but he does not want to go far from home.  Overall he would much prefer to go back to his own independent living.  Patient admitted that he was not able to stand up on his own today but had excuses for it saying that it was simply because his hip was hurting and he did not want to put much effort into it.  Also stated that if he had his walker available he thought he would be able to do it better.  Patient was able to understand that he is at elevated risk for falls and he also understands that a fall could potentially result in a fatal outcome.  Patient is not reporting any psychiatric symptoms.  Does not report depression.  Not reporting any psychotic symptoms or paranoia.  Denies hallucinations denies suicidal or homicidal ideation.  Social history: Patient is unmarried and has a long-term friend who serves as his power of attorney.  This person has powers of attorney both medical and financial which would go into effect if the patient lacked capacity.  Patient lives independently in his own apartment.  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  Reports in  the chart indicate that his friend thought that his apartment was dirty and disheveled and showed signs of neglect.  Medical history: History of congestive heart failure liver disease renal insufficiency hypertension and a recent fall.  Patient does not go to the doctor very often.  Says his only home medicine is an aspirin.  Substance abuse history: No evidence of alcohol or drug abuse  Past Psychiatric History: Patient has no history of psychiatric treatment.  I saw the patient 3 years ago in the  hospital for essentially the same question.  He did not have a mental health diagnosis at that time either.  There is no history of hospitalization no history of suicide attempts violence or psychosis.  Risk to Self:   Risk to Others:   Prior Inpatient Therapy:   Prior Outpatient Therapy:    Past Medical History:  Past Medical History:  Diagnosis Date  . Arrhythmia   . Cellulitis   . Chronic atrial fibrillation    a. not on long term anticoagulation  . Chronic systolic CHF (congestive heart failure) ([place])   . Pneumonia 2011    Past Surgical History:  Procedure Laterality Date  . PERIPHERAL VASCULAR CATHETERIZATION N/A 06/27/2015   Procedure: Dialysis/Perma Catheter Insertion;  Surgeon: Algernon Huxley, MD;  Location: [place] [facility];  Service: Cardiovascular;  Laterality: N/A;  . PERIPHERAL VASCULAR CATHETERIZATION Right 07/05/2015   Procedure: Dialysis/Perma Catheter Removal;  Surgeon: Algernon Huxley, MD;  Location: [place] [facility];  Service: Cardiovascular;  Laterality: Right;   Family History:  Family History  Problem Relation Age of Onset  . Diabetes Mother   . Stroke Mother   . Heart failure Father    Family Psychiatric  History: None known Social History:  Social History   Substance and Sexual Activity  Alcohol Use No     Social History   Substance and Sexual Activity  Drug Use No    Social History   Socioeconomic History  . Marital status: Single    Spouse name: Not on file  . Number of children: Not on file  . Years of education: Not on file  . Highest education level: Not on file  Occupational History  . Not on file  Social Needs  . Financial resource strain: Not on file  . Food insecurity:    Worry: Not on file    Inability: Not on file  . Transportation needs:    Medical: Not on file    Non-medical: Not on file  Tobacco Use  . Smoking status: Never Smoker  . Smokeless tobacco: Never Used  Substance and Sexual Activity  . Alcohol use: No  .  Drug use: No  . Sexual activity: Not on file  Lifestyle  . Physical activity:    Days per week: Not on file    Minutes per session: Not on file  . Stress: Not on file  Relationships  . Social connections:    Talks on phone: Not on file    Gets together: Not on file    Attends religious service: Not on file    Active member of club or organization: Not on file    Attends meetings of clubs or organizations: Not on file    Relationship status: Not on file  Other Topics Concern  . Not on file  Social History Narrative  . Not on file   Additional Social History:    Allergies:   Allergies  Allergen Reactions  . Penicillins Nausea  And Vomiting    Labs:  Results for orders placed or performed during the hospital encounter of 09/09/18 (from the past 48 hour(s))  Basic metabolic panel     Status: Abnormal   Collection Time: 09/09/18  7:07 PM  Result Value Ref Range   Sodium 140 135 - 145 mmol/L   Potassium 3.9 3.5 - 5.1 mmol/L   Chloride 108 98 - 111 mmol/L   CO2 24 22 - 32 mmol/L   Glucose, Bld 105 (H) 70 - 99 mg/dL   BUN 29 (H) 8 - 23 mg/dL   Creatinine, Ser 1.76 (H) 0.61 - 1.24 mg/dL   Calcium 8.6 (L) 8.9 - 10.3 mg/dL   GFR calc non Af Amer 39 (L) >60 mL/min   GFR calc Af Amer 45 (L) >60 mL/min    Comment: (NOTE) The eGFR has been calculated using the CKD EPI equation. This calculation has not been validated in all clinical situations. eGFR's persistently <60 mL/min signify possible Chronic Kidney Disease.    Anion gap 8 5 - 15    Comment: Performed at [facility], [address] [place], [place] [address]  CBC     Status: Abnormal   Collection Time: 09/09/18  7:07 PM  Result Value Ref Range   WBC 2.1 (L) 4.0 - 10.5 K/uL   RBC 2.95 (L) 4.22 - 5.81 MIL/uL   Hemoglobin 9.0 (L) 13.0 - 17.0 g/dL   HCT 29.2 (L) 39.0 - 52.0 %   MCV 99.0 80.0 - 100.0 fL   MCH 30.5 26.0 - 34.0 pg   MCHC 30.8 30.0 - 36.0 g/dL   RDW 16.7 (H) 11.5 - 15.5 %   Platelets 94 (L)  150 - 400 K/uL    Comment: Immature Platelet Fraction may be clinically indicated, consider ordering this additional test [id number]    nRBC 0.0 0.0 - 0.2 %    Comment: Performed at [facility], [address] [place], [place] [address]  Troponin I - Add-On to previous collection     Status: None   Collection Time: 09/09/18  7:37 PM  Result Value Ref Range   Troponin I <0.03 <0.03 ng/mL    Comment: Performed at [facility], [address] [place], [place] [address]  Ammonia     Status: None   Collection Time: 09/09/18  7:59 PM  Result Value Ref Range   Ammonia 30 9 - 35 umol/L    Comment: Performed at [facility], [address] [place], [place] [address]  Urinalysis, Complete w Microscopic     Status: Abnormal   Collection Time: 09/09/18 11:23 PM  Result Value Ref Range   Color, Urine YELLOW (A) YELLOW   APPearance HAZY (A) CLEAR   Specific Gravity, Urine 1.017 1.005 - 1.030   pH 5.0 5.0 - 8.0   Glucose, UA NEGATIVE NEGATIVE mg/dL   Hgb urine dipstick SMALL (A) NEGATIVE   Bilirubin Urine NEGATIVE NEGATIVE   Ketones, ur 5 (A) NEGATIVE mg/dL   Protein, ur NEGATIVE NEGATIVE mg/dL   Nitrite NEGATIVE NEGATIVE   Leukocytes, UA SMALL (A) NEGATIVE   RBC / HPF 0-5 0 - 5 RBC/hpf   WBC, UA 21-50 0 - 5 WBC/hpf   Bacteria, UA MANY (A) NONE SEEN   Squamous Epithelial / LPF 0-5 0 - 5   Mucus PRESENT     Comment: Performed at [facility], [address] [place], [place] [address]    Current Facility-Administered Medications  Medication  Dose Route Frequency Provider Last Rate Last Dose  . aspirin EC tablet 81 mg  81 mg Oral Daily Schuyler Amor, MD   81 mg at 09/10/18 0854  . furosemide (LASIX) tablet 20 mg  20 mg Oral Daily Schuyler Amor, MD   20 mg at 09/10/18 0867   Current Outpatient Medications  Medication Sig Dispense Refill  . aspirin EC 81 MG tablet Take 81 mg by mouth daily.    . furosemide (LASIX) 20 MG tablet Take 1 tablet  (20 mg total) by mouth daily. (Patient not taking: Reported on 04/02/2016) 30 tablet 2  . tamsulosin (FLOMAX) 0.4 MG CAPS capsule Take 1 capsule (0.4 mg total) by mouth daily. (Patient not taking: Reported on 04/02/2016) 30 capsule 2    Musculoskeletal: Strength & Muscle Tone: decreased Gait & Station: unable to stand Patient leans: N/A  Psychiatric Specialty Exam: Physical Exam  Nursing note and vitals reviewed. Constitutional: He appears well-developed and well-nourished.  HENT:  Head: Normocephalic and atraumatic.  Eyes: Pupils are equal, round, and reactive to light. Conjunctivae are normal.  Neck: Normal range of motion.  Cardiovascular: Regular rhythm and normal heart sounds.  Respiratory: Effort normal. No respiratory distress.  GI: Soft.  Musculoskeletal: Normal range of motion.  Neurological: He is alert.  Skin: Skin is warm and dry.  Psychiatric: He has a normal mood and affect. His speech is normal and behavior is normal. Judgment and thought content normal. Cognition and memory are normal.    Review of Systems  Constitutional: Negative.   HENT: Negative.   Eyes: Negative.   Respiratory: Negative.   Cardiovascular: Negative.   Gastrointestinal: Negative.   Musculoskeletal: Negative.   Skin: Negative.   Neurological: Negative.   Psychiatric/Behavioral: Negative.  Negative for depression.    Blood pressure (!) 171/81, pulse 71, temperature 98.5 F (36.9 C), temperature source Oral, resp. rate 20, height _0  (1.727 m), weight 106.8 kg, SpO2 96 %.Body mass index is 35.8 kg/m.  General Appearance: Disheveled  Eye Contact:  Fair  Speech:  Slow  Volume:  Decreased  Mood:  Euthymic  Affect:  Congruent  Thought Process:  Goal Directed  Orientation:  Full (Time, Place, and Person)  Thought Content:  Logical  Suicidal Thoughts:  No  Homicidal Thoughts:  No  Memory:  Immediate;   Fair Recent;   Fair Remote;   Fair  Judgement:  Fair  Insight:  Fair  Psychomotor  Activity:  Decreased  Concentration:  Concentration: Fair  Recall:  [employer] of Knowledge:  Fair  Language:  Fair  Akathisia:  No  Handed:  Right  AIMS (if indicated):     Assets:  Desire for Improvement Housing  ADL's:  Intact  Cognition:  WNL  Sleep:        Treatment Plan Summary: Plan This is a 67 year old man with no past psychiatric history who is in the hospital after having 2 falls at home within a short period of time.  Apparently now physical therapy and emergency room staff have assessed his ability to ambulate and believe that he is not able to walk on his own.  The patient disputes this and has some excuses around it.  On evaluation the patient is not psychotic and not depressed.  I did some basic cognitive testing and there is no sign of any dementia.  He is completely alert and oriented could remember 2 out of 3 objects at 3 minutes spell a word backwards write a  sentence.  Patient appears to value his independence to a very large degree.  In talking with the patient he spontaneously was able to acknowledge that a fall could result in a fatal outcome.  He was able to acknowledge that based on his situation he is at an elevated risk of falls.  Despite this the patient states that he is willing to take that risk rather than go to a rehab if it is too far away from home because he is afraid that he would not be able to regain his independent living.  Under the circumstances the patient meets criteria for having normal capacity to make this decision despite the fact that it is not the decision that the healthcare team would recommend.  No psychiatric illness identified no need for any treatment.  Disposition: No evidence of imminent risk to self or others at present.   Patient does not meet criteria for psychiatric inpatient admission. Supportive therapy provided about ongoing stressors.  Alethia Berthold, MD 09/10/2018 3:36 PM

## 2018-09-10 NOTE — ED Notes (Signed)
Pt assisted with urinal in room.

## 2018-09-10 NOTE — Progress Notes (Signed)
Patient has no payer for SNF and does not have a 3 night inpatient qualifying stay in a hospital in the past 30 days. RN case manager aware of above and will arrange home health.   [employer], LCSW [phone]

## 2018-09-10 NOTE — ED Notes (Signed)
Nurse cleaned and reposition pt. Pt placed into clean gown. Pt has some bruising noted to left posterior hip and left axillary region. Pt seems weak with movements or has difficulty understanding instructions.

## 2018-09-10 NOTE — ED Notes (Signed)
Nurse calling SW to confirm they are aware of consultation. SW stating she needs to talk to Dr. Burlene Arnt.

## 2018-09-10 NOTE — ED Notes (Addendum)
Nurse assisted pt with dressing. Pt's clothes smelling of urine. Nurse encouraged pt to wear gown home. Pt is not wanting to wear anything but his clothes. Nurse provided mess underwear.  Waiting to hear back from case worker about pt's ride

## 2018-09-10 NOTE — Progress Notes (Signed)
PT Cancellation Note  Patient Details Name: Aaron Harrison MRN: [id number] DOB: 03/25/1951   Cancelled Treatment:    Reason Eval/Treat Not Completed: Other (comment).  Spoke with SW who reports pt does not qualify for SNF stay.  Spoke with CM who is planning to set up HHPT at d/c, pt has a RW.      Collie Siad PT, DPT 09/10/2018, 10:20 AM

## 2018-09-10 NOTE — Discharge Summary (Signed)
[facility] [place] NAME: Aaron Harrison    MR#:  [id number]  DATE OF BIRTH:  01-07-1951  DATE OF ADMISSION:  09/05/2018   ADMITTING PHYSICIAN: Vaughan Basta, MD  DATE OF DISCHARGE: 09/06/2018  5:04 PM  PRIMARY CARE PHYSICIAN: Donnie Coffin, MD   ADMISSION DIAGNOSIS:  CHF (congestive heart failure) (HCC) [Q67.6] Systolic congestive heart failure, unspecified HF chronicity ([place]) [I50.20] DISCHARGE DIAGNOSIS:  Active Problems:   Acute on chronic systolic CHF (congestive heart failure) (HCC)   Acute on chronic systolic (congestive) heart failure ([place])  SECONDARY DIAGNOSIS:   Past Medical History:  Diagnosis Date  . Arrhythmia   . Cellulitis   . Chronic atrial fibrillation    a. not on long term anticoagulation  . Chronic systolic CHF (congestive heart failure) ([place])   . Pneumonia 2011   HOSPITAL COURSE:  67 y.o. male with a hx of chronic systolic heart failure and paroxysmal atrial fibrillation admitted for worsening SOB  *Acute on chronic systolic congestive heart failure Ejection fraction per echocardiogram 3 years ago was 35%. Patient has been refusing Diuretics and left AMA.   * urinary incontinence and urge incontinence - on flomax. outpt Urology eval (d/w Dr Erlene Quan)  *Chronic kidney disease stage III - stable  *Hypertension - change metoprolol to coreg per cardio  * Aemia of CKD. Continue to monitor.   He's left AMA DISCHARGE CONDITIONS:  fair CONSULTS OBTAINED:  Treatment Team:  Wellington Hampshire, MD DRUG ALLERGIES:   Allergies  Allergen Reactions  . Penicillins Nausea And Vomiting   DISCHARGE MEDICATIONS:   Allergies as of 09/06/2018      Reactions   Penicillins Nausea And Vomiting      Medication List    ASK your doctor about these medications   aspirin EC 81 MG tablet Take 81 mg by mouth daily.   furosemide 20 MG tablet Commonly known as:  LASIX Take 1 tablet (20 mg  total) by mouth daily.   tamsulosin 0.4 MG Caps capsule Commonly known as:  FLOMAX Take 1 capsule (0.4 mg total) by mouth daily.        DISCHARGE INSTRUCTIONS:   DIET:  Cardiac diet DISCHARGE CONDITION:  Good ACTIVITY:  Activity as tolerated OXYGEN:  Home Oxygen: No.  Oxygen Delivery: room air DISCHARGE LOCATION:  home   If you experience worsening of your admission symptoms, develop shortness of breath, life threatening emergency, suicidal or homicidal thoughts you must seek medical attention immediately by calling 911 or calling your MD immediately  if symptoms less severe.  You Must read complete instructions/literature along with all the possible adverse reactions/side effects for all the Medicines you take and that have been prescribed to you. Take any new Medicines after you have completely understood and accpet all the possible adverse reactions/side effects.   Please note  You were cared for by a hospitalist during your hospital stay. If you have any questions about your discharge medications or the care you received while you were in the hospital after you are discharged, you can call the unit and asked to speak with the hospitalist on call if the hospitalist that took care of you is not available. Once you are discharged, your primary care physician will handle any further medical issues. Please note that NO REFILLS for any discharge medications will be authorized once you are discharged, as it is imperative that you return to your primary care physician (or establish a relationship with  a primary care physician if you do not have one) for your aftercare needs so that they can reassess your need for medications and monitor your lab values.    On the day of Discharge:  VITAL SIGNS:  Blood pressure (!) 158/68, pulse 67, temperature 98 F (36.7 C), temperature source Oral, resp. rate 18, height 5\' 8"  (1.727 m), weight 104.7 kg, SpO2 98 %. PHYSICAL EXAMINATION:  GENERAL:   67 y.o.-year-old patient lying in the bed with no acute distress.  EYES: Pupils equal, round, reactive to light and accommodation. No scleral icterus. Extraocular muscles intact.  HEENT: Head atraumatic, normocephalic. Oropharynx and nasopharynx clear.  NECK:  Supple, no jugular venous distention. No thyroid enlargement, no tenderness.  LUNGS: Normal breath sounds bilaterally, no wheezing, rales,rhonchi or crepitation. No use of accessory muscles of respiration.  CARDIOVASCULAR: S1, S2 normal. No murmurs, rubs, or gallops.  ABDOMEN: Soft, non-tender, non-distended. Bowel sounds present. No organomegaly or mass.  EXTREMITIES: No pedal edema, cyanosis, or clubbing.  NEUROLOGIC: Cranial nerves II through XII are intact. Muscle strength 5/5 in all extremities. Sensation intact. Gait not checked.  PSYCHIATRIC: The patient is alert and oriented x 3.  SKIN: No obvious rash, lesion, or ulcer.  DATA REVIEW:   CBC Recent Labs  Lab 09/09/18 1907  WBC 2.1*  HGB 9.0*  HCT 29.2*  PLT 94*    Chemistries  Recent Labs  Lab 09/05/18 1400  09/09/18 1907  NA 139   < > 140  K 4.0   < > 3.9  CL 108   < > 108  CO2 23   < > 24  GLUCOSE 106*   < > 105*  BUN 20   < > 29*  CREATININE 1.36*   < > 1.76*  CALCIUM 8.6*   < > 8.6*  AST 13*  --   --   ALT 9  --   --   ALKPHOS 62  --   --   BILITOT 2.3*  --   --    < > = values in this interval not displayed.     Microbiology Results  Results for orders placed or performed during the hospital encounter of 06/10/15  Blood Culture (routine x 2)     Status: None   Collection Time: 06/10/15  2:35 PM  Result Value Ref Range Status   Specimen Description BLOOD RIGHT WRIST  Final   Special Requests BOTTLES DRAWN AEROBIC AND ANAEROBIC  [place]  Final   Culture NO GROWTH 5 DAYS  Final   Report Status 06/15/2015 FINAL  Final  Blood Culture (routine x 2)     Status: None   Collection Time: 06/10/15  2:40 PM  Result Value Ref Range Status   Specimen  Description BLOOD LEFT ASSIST CONTROL  Final   Special Requests   Final    BOTTLES DRAWN AEROBIC AND ANAEROBIC  AER 6CC ANA 4CC   Culture NO GROWTH 5 DAYS  Final   Report Status 06/15/2015 FINAL  Final  Urine culture     Status: None   Collection Time: 06/10/15  4:42 PM  Result Value Ref Range Status   Specimen Description URINE, RANDOM  Final   Special Requests NONE  Final   Culture 4,000 COLONIES/mL INSIGNIFICANT GROWTH  Final   Report Status 06/12/2015 FINAL  Final  MRSA PCR Screening     Status: None   Collection Time: 06/10/15  6:27 PM  Result Value Ref Range Status   MRSA by PCR  NEGATIVE NEGATIVE Final    Comment:        The GeneXpert MRSA Assay (FDA approved for NASAL specimens only), is one component of a comprehensive MRSA colonization surveillance program. It is not intended to diagnose MRSA infection nor to guide or monitor treatment for MRSA infections.   Culture, sputum-assessment     Status: None   Collection Time: 06/11/15  2:50 PM  Result Value Ref Range Status   Specimen Description SPUTUM  Final   Special Requests NONE  Final   Sputum evaluation THIS SPECIMEN IS ACCEPTABLE FOR SPUTUM CULTURE  Final   Report Status 06/13/2015 FINAL  Final  Culture, respiratory (NON-Expectorated)     Status: None   Collection Time: 06/11/15  2:50 PM  Result Value Ref Range Status   Specimen Description SPUTUM  Final   Special Requests NONE Reflexed from [id number]  Final   Gram Stain   Final    MODERATE WBC SEEN RARE GRAM POSITIVE COCCI GOOD SPECIMEN - 80-90% WBCS    Culture MODERATE GROWTH STAPHYLOCOCCUS AUREUS  Final   Report Status 06/14/2015 FINAL  Final   Organism ID, Bacteria STAPHYLOCOCCUS AUREUS  Final      Susceptibility   Staphylococcus aureus - MIC*    CIPROFLOXACIN <=0.5 SENSITIVE Sensitive     GENTAMICIN <=0.5 SENSITIVE Sensitive     OXACILLIN 0.5 SENSITIVE Sensitive     TRIMETH/SULFA <=10 SENSITIVE Sensitive     CEFOXITIN SCREEN NEGATIVE Sensitive      Inducible Clindamycin NEGATIVE Sensitive     TETRACYCLINE Value in next row Sensitive      SENSITIVE<=1    * MODERATE GROWTH STAPHYLOCOCCUS AUREUS  Wound culture     Status: None   Collection Time: 06/13/15  1:56 PM  Result Value Ref Range Status   Specimen Description LEG  Final   Special Requests Normal  Final   Gram Stain   Final    FEW WBC SEEN MODERATE GRAM NEGATIVE RODS FEW GRAM POSITIVE COCCI    Culture   Final    LIGHT GROWTH STAPHYLOCOCCUS AUREUS RARE GROWTH PROTEUS PENNERI RARE GROWTH ENTEROCOCCUS FAECALIS    Report Status 06/17/2015 FINAL  Final   Organism ID, Bacteria STAPHYLOCOCCUS AUREUS  Final   Organism ID, Bacteria PROTEUS PENNERI  Final   Organism ID, Bacteria ENTEROCOCCUS FAECALIS  Final      Susceptibility   Staphylococcus aureus - MIC*    CIPROFLOXACIN <=0.5 SENSITIVE Sensitive     GENTAMICIN <=0.5 SENSITIVE Sensitive     OXACILLIN 0.5 SENSITIVE Sensitive     TRIMETH/SULFA <=10 SENSITIVE Sensitive     CEFOXITIN SCREEN NEGATIVE Sensitive     Inducible Clindamycin NEGATIVE Sensitive     TETRACYCLINE Value in next row Sensitive      SENSITIVE<=1    * LIGHT GROWTH STAPHYLOCOCCUS AUREUS   Proteus penneri - MIC*    AMPICILLIN Value in next row Resistant      SENSITIVE<=1    CEFAZOLIN Value in next row Resistant      SENSITIVE<=1    CEFTRIAXONE Value in next row Sensitive      SENSITIVE<=1    CIPROFLOXACIN Value in next row Sensitive      SENSITIVE<=1    GENTAMICIN Value in next row Sensitive      SENSITIVE<=1    IMIPENEM Value in next row Sensitive      SENSITIVE<=1    NITROFURANTOIN Value in next row Resistant      SENSITIVE<=1    TRIMETH/SULFA Value in  next row Sensitive      SENSITIVE<=1    PIP/TAZO Value in next row Sensitive      SENSITIVE<=4    * RARE GROWTH PROTEUS PENNERI   Enterococcus faecalis - MIC*    AMPICILLIN Value in next row Sensitive      SENSITIVE<=4    LINEZOLID Value in next row Sensitive      SENSITIVE<=4    * RARE GROWTH  ENTEROCOCCUS FAECALIS  Wound culture     Status: None   Collection Time: 06/13/15  1:58 PM  Result Value Ref Range Status   Specimen Description LEG  Final   Special Requests Normal  Final   Gram Stain RARE WBC SEEN FEW GRAM NEGATIVE RODS   Final   Culture NO GROWTH 4 DAYS  Final   Report Status 06/17/2015 FINAL  Final  MRSA PCR Screening     Status: None   Collection Time: 07/01/15  7:40 PM  Result Value Ref Range Status   MRSA by PCR NEGATIVE NEGATIVE Final    Comment:        The GeneXpert MRSA Assay (FDA approved for NASAL specimens only), is one component of a comprehensive MRSA colonization surveillance program. It is not intended to diagnose MRSA infection nor to guide or monitor treatment for MRSA infections.   Cath Tip Culture     Status: None   Collection Time: 07/01/15  9:14 PM  Result Value Ref Range Status   Specimen Description CATH TIP  Final   Special Requests NONE  Final   Culture NO GROWTH 3 DAYS  Final   Report Status 07/04/2015 FINAL  Final    RADIOLOGY:  Dg Pelvis 1-2 Views  Result Date: 09/09/2018 CLINICAL DATA:  Weakness after fall this morning. EXAM: PELVIS - 1-2 VIEW COMPARISON:  06/22/2015 FINDINGS: Moderate joint space narrowing of both hips. No pelvic fracture or diastasis. Proximal femora appear intact. No joint dislocations. Soft tissues are unremarkable. 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   On: 09/09/2018 20:10   Ct Head Wo Contrast  Result Date: 09/09/2018 CLINICAL DATA:  Patient fell this morning and has increased weakness. EXAM: CT HEAD WITHOUT CONTRAST TECHNIQUE: Contiguous axial images were obtained from the base of the skull through the vertex without intravenous contrast. COMPARISON:  04/29/2011 FINDINGS: Brain: Chronic involutional changes of brain with microvascular ischemic disease. 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 No  intra-axial mass, hemorrhage or midline shift. No large vascular territory infarct. Midline fourth ventricle basal cisterns without effacement. Intact brainstem and cerebellum. Remote right caudate lacunar infarct. Vascular: No hyperdense vessel sign. Atherosclerosis of the carotid siphons. Skull: Intact Sinuses/Orbits: Stigmata of chronic sinusitis with thickened maxillary sinus walls. Mild ethmoid sinus mucosal thickening. Polypoid soft tissue densities within the nasal passages may represent small nasal polyps. Intact orbits. Other: None IMPRESSION: Atrophy with chronic microvascular ischemia. No acute intracranial abnormality. Electronically Signed   By: Ashley Royalty M.D.   On: 09/09/2018 20:07     Management plans discussed with the patient, family and they are in agreement.  CODE STATUS: Prior   TOTAL TIME TAKING CARE OF THIS PATIENT: 35 minutes.    Max Sane M.D on 09/10/2018 at 10:09 AM  Between 7am to 6pm - Pager - [phone]  After 6pm go to [email or web address] - [employer] [facility]  Office  [phone]  CC: Primary care physician; Donnie Coffin, MD  Note: This dictation was prepared with Dragon dictation along with smaller phrase technology. Any transcriptional errors that result from this process are unintentional.

## 2018-09-10 NOTE — ED Notes (Signed)
PT at bedside.

## 2018-09-10 NOTE — Progress Notes (Signed)
Clinical Education officer, museum (CSW) and Chief Strategy Officer met with patient at bedside. Per patient he would like to go to short term rehab. CSW and RN case manager explained that medicare will not pay for SNF because he has not had a 3 night inpatient stay. CSW discussed private pay options for SNF however patient reported that he can't pay privately for SNF. Patient understands that he has no payer for SNF and will have to D/C home. RN case Freight forwarder and CSW attempted to contact patient's friend Gershon Mussel however he did not answer and voicemail was left.    [employer], LCSW [phone]

## 2018-09-10 NOTE — Care Management (Signed)
r Chen contact csw regarding placing patient in observation because patient ED care team members concerned about discharging patient home. CM spoke with Dr Bridgett Larsson and asked if there was medical necessity to be placed in observation.  Responded that patient can not walk. Stated patient has a UTI.  Informed caremanager that patient's UTI could be treated with oral antibiotic. Physical therapy note indicates that patient ambulated 40 feet but showed poor awareness and veering with walker into obstacles.  A referral has been made to [facility] but agency can not see patient until Sunday. CM contacted agency regarding initiating care on 11/16 and after contacting their director, informed this could be accommodated. CM and csw spoke with patient. He says he does have pcp- Dr Clide Deutscher at [facility].  Says he has not seen her since 2018. Explained he would have to get re established.   Also says he sees Darylene Price at the [facility] "when I need it."  He has not been seen since 2017. Has a walker.   Is disabled due to CHF and had medicaid in the past but it ended  and he does not know why.  Wonder if his income was too high to qualify.  He receives 1200 dollars a month in disability. He says that until mid last week, he was independent in all his adls, driving and working as a Oceanographer. 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  When he awoke and got out of bed, tripped on bedding and injured his right side. He had to call out of work. the day after the fall.  Since that time, he had not been able to "get around" or take care of himself due to pain.  Xrays have been negative.  Patient says he is "getting admitted so I can go to rehab."   Says his friend Gershon Mussel is bringing him some clothes and he also thinks "I am   getting admitted so I can  to go to rehab". CM in the presence of csw explained to patient the terms of observation and admission. Discussed if  was to stay-  would be placed in observation and would not qualify to go to rehab that is covered by medicare.  He verbalizes understanding. He allows that he would not be able to pay for skilled nursing placement out of pocket. Discussed that home health visits can start tomorrow and agency will will get patient established with pcp.  He says that"if my side did not hurt so much when I tried to move I could go home right now.  Patient adamant that his pain is the reason he is having trouble walking. CM explained homebound criteria in order to receive home health services because he asks if it is okay for him to go out and run errands and walk to the store 3 blocks away.  He is very agreeable to home health "especially if it is covered by medicare."  CM spoke with Dr Bridgett Larsson and Dr Alfred Levins regarding need to control pain before discharges home and he will be placed in observation.  [facility] is on standby to make home visit tomorrow after 1pm. CM left phone message for [name] to clarify that patient is being placed in observation and not "admitted" so he can stay three night to go to skilled nursing facility. Patient contradicts himself many times throughout this conversation.

## 2018-09-11 ENCOUNTER — Encounter: Payer: Self-pay | Admitting: Internal Medicine

## 2018-09-11 ENCOUNTER — Encounter: Payer: Self-pay | Admitting: Emergency Medicine

## 2018-09-11 ENCOUNTER — Other Ambulatory Visit: Payer: Self-pay

## 2018-09-11 ENCOUNTER — Inpatient Hospital Stay
Admission: EM | Admit: 2018-09-11 | Discharge: 2018-09-15 | DRG: 065 | Disposition: A | Discharge status: Skilled Nursing Facility | Payer: Medicare Other | Attending: Internal Medicine | Admitting: Internal Medicine

## 2018-09-11 DIAGNOSIS — Z7982 Long term (current) use of aspirin: Secondary | ICD-10-CM

## 2018-09-11 DIAGNOSIS — Z8249 Family history of ischemic heart disease and other diseases of the circulatory system: Secondary | ICD-10-CM

## 2018-09-11 DIAGNOSIS — Z880 Allergy status to penicillin: Secondary | ICD-10-CM

## 2018-09-11 DIAGNOSIS — I639 Cerebral infarction, unspecified: Principal | ICD-10-CM

## 2018-09-11 DIAGNOSIS — S32029A Unspecified fracture of second lumbar vertebra, initial encounter for closed fracture: Secondary | ICD-10-CM | POA: Diagnosis present

## 2018-09-11 DIAGNOSIS — R531 Weakness: Secondary | ICD-10-CM

## 2018-09-11 DIAGNOSIS — Z833 Family history of diabetes mellitus: Secondary | ICD-10-CM

## 2018-09-11 DIAGNOSIS — Z66 Do not resuscitate: Secondary | ICD-10-CM | POA: Diagnosis present

## 2018-09-11 DIAGNOSIS — N390 Urinary tract infection, site not specified: Secondary | ICD-10-CM | POA: Diagnosis present

## 2018-09-11 DIAGNOSIS — W19XXXD Unspecified fall, subsequent encounter: Secondary | ICD-10-CM | POA: Diagnosis not present

## 2018-09-11 DIAGNOSIS — I480 Paroxysmal atrial fibrillation: Secondary | ICD-10-CM | POA: Diagnosis present

## 2018-09-11 DIAGNOSIS — N179 Acute kidney failure, unspecified: Secondary | ICD-10-CM | POA: Diagnosis present

## 2018-09-11 DIAGNOSIS — Y92009 Unspecified place in unspecified non-institutional (private) residence as the place of occurrence of the external cause: Secondary | ICD-10-CM

## 2018-09-11 DIAGNOSIS — G8194 Hemiplegia, unspecified affecting left nondominant side: Secondary | ICD-10-CM | POA: Diagnosis present

## 2018-09-11 DIAGNOSIS — S32020A Wedge compression fracture of second lumbar vertebra, initial encounter for closed fracture: Secondary | ICD-10-CM

## 2018-09-11 DIAGNOSIS — B961 Klebsiella pneumoniae [K. pneumoniae] as the cause of diseases classified elsewhere: Secondary | ICD-10-CM | POA: Diagnosis present

## 2018-09-11 DIAGNOSIS — R29708 NIHSS score 8: Secondary | ICD-10-CM | POA: Diagnosis present

## 2018-09-11 DIAGNOSIS — I482 Chronic atrial fibrillation, unspecified: Secondary | ICD-10-CM | POA: Diagnosis present

## 2018-09-11 DIAGNOSIS — I5023 Acute on chronic systolic (congestive) heart failure: Secondary | ICD-10-CM | POA: Diagnosis not present

## 2018-09-11 DIAGNOSIS — I110 Hypertensive heart disease with heart failure: Secondary | ICD-10-CM | POA: Diagnosis present

## 2018-09-11 DIAGNOSIS — I5022 Chronic systolic (congestive) heart failure: Secondary | ICD-10-CM | POA: Diagnosis present

## 2018-09-11 DIAGNOSIS — W19XXXA Unspecified fall, initial encounter: Secondary | ICD-10-CM | POA: Diagnosis present

## 2018-09-11 DIAGNOSIS — Z823 Family history of stroke: Secondary | ICD-10-CM

## 2018-09-11 DIAGNOSIS — R61 Generalized hyperhidrosis: Secondary | ICD-10-CM | POA: Diagnosis present

## 2018-09-11 DIAGNOSIS — W010XXA Fall on same level from slipping, tripping and stumbling without subsequent striking against object, initial encounter: Secondary | ICD-10-CM | POA: Diagnosis present

## 2018-09-11 DIAGNOSIS — M5136 Other intervertebral disc degeneration, lumbar region: Secondary | ICD-10-CM | POA: Diagnosis present

## 2018-09-11 LAB — CBC WITH DIFFERENTIAL/PLATELET
Abs Immature Granulocytes: 0.01 10*3/uL (ref 0.00–0.07)
BASOS PCT: 1 %
Basophils Absolute: 0 10*3/uL (ref 0.0–0.1)
EOS ABS: 0.2 10*3/uL (ref 0.0–0.5)
EOS PCT: 9 %
HEMATOCRIT: 32.1 % — AB (ref 39.0–52.0)
Hemoglobin: 9.9 g/dL — ABNORMAL LOW (ref 13.0–17.0)
Immature Granulocytes: 1 %
LYMPHS ABS: 0.4 10*3/uL — AB (ref 0.7–4.0)
Lymphocytes Relative: 21 %
MCH: 30.2 pg (ref 26.0–34.0)
MCHC: 30.8 g/dL (ref 30.0–36.0)
MCV: 97.9 fL (ref 80.0–100.0)
Monocytes Absolute: 0.1 10*3/uL (ref 0.1–1.0)
Monocytes Relative: 5 %
NRBC: 0 % (ref 0.0–0.2)
Neutro Abs: 1.3 10*3/uL — ABNORMAL LOW (ref 1.7–7.7)
Neutrophils Relative %: 63 %
PLATELETS: 94 10*3/uL — AB (ref 150–400)
RBC: 3.28 MIL/uL — AB (ref 4.22–5.81)
RDW: 16.6 % — AB (ref 11.5–15.5)
WBC: 2.1 10*3/uL — AB (ref 4.0–10.5)

## 2018-09-11 LAB — CBC
HCT: 27.7 % — ABNORMAL LOW (ref 39.0–52.0)
Hemoglobin: 8.8 g/dL — ABNORMAL LOW (ref 13.0–17.0)
MCH: 30.8 pg (ref 26.0–34.0)
MCHC: 31.8 g/dL (ref 30.0–36.0)
MCV: 96.9 fL (ref 80.0–100.0)
PLATELETS: 84 10*3/uL — AB (ref 150–400)
RBC: 2.86 MIL/uL — ABNORMAL LOW (ref 4.22–5.81)
RDW: 16.4 % — AB (ref 11.5–15.5)
WBC: 1.8 10*3/uL — ABNORMAL LOW (ref 4.0–10.5)
nRBC: 0 % (ref 0.0–0.2)

## 2018-09-11 LAB — BASIC METABOLIC PANEL
ANION GAP: 6 (ref 5–15)
Anion gap: 8 (ref 5–15)
BUN: 21 mg/dL (ref 8–23)
BUN: 21 mg/dL (ref 8–23)
CALCIUM: 8.3 mg/dL — AB (ref 8.9–10.3)
CALCIUM: 8.9 mg/dL (ref 8.9–10.3)
CO2: 24 mmol/L (ref 22–32)
CO2: 25 mmol/L (ref 22–32)
CREATININE: 1.34 mg/dL — AB (ref 0.61–1.24)
CREATININE: 1.39 mg/dL — AB (ref 0.61–1.24)
Chloride: 111 mmol/L (ref 98–111)
Chloride: 106 mmol/L (ref 98–111)
GFR calc Af Amer: >60 mL/min (ref >60)
GFR calc Af Amer: 60 mL/min — ABNORMAL LOW (ref >60)
GFR calc non Af Amer: 54 mL/min — ABNORMAL LOW (ref >60)
GFR calc non Af Amer: 52 mL/min — ABNORMAL LOW (ref >60)
GLUCOSE: 115 mg/dL — AB (ref 70–99)
Glucose, Bld: 104 mg/dL — ABNORMAL HIGH (ref 70–99)
Potassium: 3.5 mmol/L (ref 3.5–5.1)
Potassium: 4.1 mmol/L (ref 3.5–5.1)
Sodium: 141 mmol/L (ref 135–145)
Sodium: 139 mmol/L (ref 135–145)

## 2018-09-11 MED ORDER — CARVEDILOL 12.5 MG PO TABS: 12.5000 mg | ORAL_TABLET | Freq: Two times a day (BID) | ORAL | 0 refills | Status: DC

## 2018-09-11 MED ORDER — CEPHALEXIN 500 MG PO CAPS
500.0000 mg | ORAL_CAPSULE | Freq: Four times a day (QID) | ORAL | Status: DC
  Administered 2018-09-11 – 2018-09-12 (×4): 500 mg via ORAL
  Filled 2018-09-11 (×4): qty 1

## 2018-09-11 MED ORDER — OXYCODONE-ACETAMINOPHEN 5-325 MG PO TABS: 1.0000 | ORAL_TABLET | Freq: Four times a day (QID) | ORAL | 0 refills | Status: DC | PRN

## 2018-09-11 NOTE — Progress Notes (Signed)
Pt assisted to get dressed and prepared for discharge. Pt moves very slowly. States that he is not weak but sore from recent fall. States that he is ambulatory with walker and feels comfortable with this at home. States that he has friends and neighbors that will help him with things "around the house" as needed.

## 2018-09-11 NOTE — ED Notes (Signed)
Pt stated he needed to be moved to a room so he could use a bedpan for a bowel movement. Pt stated that he was not able to walk due to L side body pain. Pt moved from 19 hall to room in pod C and placed on a bed pan. Pt produced small quantity of soft brown stool. Peri care provided and Pt placed in a clean brief. Pt returned to 19 hall.

## 2018-09-11 NOTE — ED Notes (Signed)
Pt stated he needed to use the bedpan for a bowel movement. Pt stretcher moved from 65 hall to a room in pod C to allow him privacy. Pt passed small amount of soft brown stool. Peri care provided and new brief placed. Pt returned to 19 hall.

## 2018-09-11 NOTE — Care Management Note (Signed)
Case Management Note  Patient Details  Name: Aaron Harrison MRN: [id number] Date of Birth: 29-Jun-1951  Subjective/Objective:  Patient to be discharged per MD order. Orders in place for home health services. Previous RNCM had began workup for home health via [facility]. Confirmed referral with Malachy Mood from [facility] because there were plans for same day start of care. No DME needs.                    Action/Plan:   Expected Discharge Date:  09/11/18               Expected Discharge Plan:  [place]  In-House Referral:  Clinical Social Work  Discharge planning Services  CM Consult  Post Acute Care Choice:    Choice offered to:  Patient  DME Arranged:    DME Agency:     HH Arranged:  RN, PT, Nurse's Aide, Social Work [employer] Agency:  [employer]  Status of Service:  Completed, signed off  If discussed at [employer], dates discussed:    Additional Comments:  Latanya Maudlin, RN 09/11/2018, 10:46 AM

## 2018-09-11 NOTE — ED Triage Notes (Signed)
Pt presents to ED via [facility] from home. Pt was D/C from floor at approx 1420 today. Pt initially seen in ED due to being unable to ambulate. Pt was put in recliner by EMS upon arriving home and has been unable to get up since.

## 2018-09-11 NOTE — ED Provider Notes (Signed)
[facility] Emergency Department Provider Note  _________________________________________   I have reviewed the triage vital signs and the nursing notes.   HISTORY  Chief Complaint Weakness   History limited by: Not Limited   HPI Aaron Harrison is a 67 y.o. male who presents to the emergency department today because of concern for continued weakness. The patient was discharged from the hospital earlier today after an admission for UTI and AKI. He states that since going home and being placed in a recliner he has not been able to move. He states that he was told prior to discharge that he would have home health and PT however he states that they did not show up.    Per medical record review patient has a history of recent admission with discharge today. Per sw notes the patient was supposed to have some form of aid come out today after discharge. Did not qualify for SNF or rehab.   Past Medical History:  Diagnosis Date  . Arrhythmia   . Cellulitis   . Chronic atrial fibrillation    a. not on long term anticoagulation  . Chronic systolic CHF (congestive heart failure) ([place])   . Pneumonia 2011    Patient Active Problem List   Diagnosis Date Noted  . Fall 09/10/2018  . Back pain 09/10/2018  . Acute on chronic systolic (congestive) heart failure ([place]) 09/05/2018  . Chronic renal insufficiency 07/19/2015  . Mild cognitive impairment 07/06/2015  . NSVT (nonsustained ventricular tachycardia) ([place])   . Liver disease   . Hyperkalemia 06/10/2015  . Ascites 06/10/2015  . Coagulopathy ([place]) 06/10/2015  . Acute on chronic systolic CHF (congestive heart failure) ([place]) 05/11/2015  . Essential hypertension 05/11/2015  . Bilateral edema of lower extremity   . Atrial fibrillation, chronic 04/07/2015  . Stasis ulcer of lower extremity ([place]) 04/07/2015    Past Surgical History:  Procedure Laterality Date  . PERIPHERAL VASCULAR CATHETERIZATION N/A 06/27/2015    Procedure: Dialysis/Perma Catheter Insertion;  Surgeon: Algernon Huxley, MD;  Location: [place] [facility];  Service: Cardiovascular;  Laterality: N/A;  . PERIPHERAL VASCULAR CATHETERIZATION Right 07/05/2015   Procedure: Dialysis/Perma Catheter Removal;  Surgeon: Algernon Huxley, MD;  Location: [facility];  Service: Cardiovascular;  Laterality: Right;    Prior to Admission medications   Medication Sig Start Date End Date Taking? Authorizing Provider  aspirin EC 81 MG tablet Take 81 mg by mouth daily.    [provider]  carvedilol (COREG) 12.5 MG tablet Take 1 tablet (12.5 mg total) by mouth 2 (two) times daily with a meal. 09/11/18   Sudini, Alveta Heimlich, MD  furosemide (LASIX) 20 MG tablet Take 1 tablet (20 mg total) by mouth daily. Patient not taking: Reported on 04/02/2016 07/12/15   Gladstone Lighter, MD  oxyCODONE-acetaminophen (PERCOCET) 5-325 MG tablet Take 1 tablet by mouth every 6 (six) hours as needed for severe pain. 09/11/18 09/11/19  Hillary Bow, MD  tamsulosin (FLOMAX) 0.4 MG CAPS capsule Take 1 capsule (0.4 mg total) by mouth daily. Patient not taking: Reported on 04/02/2016 07/12/15   Gladstone Lighter, MD    Allergies Penicillins  Family History  Problem Relation Age of Onset  . Diabetes Mother   . Stroke Mother   . Heart failure Father     Social History Social History   Tobacco Use  . Smoking status: Never Smoker  . Smokeless tobacco: Never Used  Substance Use Topics  . Alcohol use: No  . Drug  use: No    Review of Systems Constitutional: No fever/chills Eyes: No visual changes. ENT: No sore throat. Cardiovascular: Denies chest pain. Respiratory: Denies shortness of breath. Gastrointestinal: No abdominal pain.  No nausea, no vomiting.  No diarrhea.   Genitourinary: Negative for dysuria. Musculoskeletal: Positive for some low back pain.  Skin: Negative for rash. Neurological: Negative for headaches, focal weakness or  numbness.  ____________________________________________   PHYSICAL EXAM:  VITAL SIGNS: ED Triage Vitals  Enc Vitals Group     BP 09/11/18 1730 133/72     Pulse Rate 09/11/18 1730 70     Resp 09/11/18 1730 18     Temp 09/11/18 1730 99 F (37.2 C)     Temp Source 09/11/18 1730 Oral     SpO2 09/11/18 1730 99 %     Weight 09/11/18 1729 235 lb 7.2 oz (106.8 kg)     Height 09/11/18 1729 5\' 8"  (1.727 m)     Head Circumference --      Peak Flow --      Pain Score 09/11/18 1729 0   Constitutional: Alert and oriented.  Eyes: Conjunctivae are normal.  ENT      Head: Normocephalic and atraumatic.      Nose: No congestion/rhinnorhea.      Mouth/Throat: Mucous membranes are moist.      Neck: No stridor. Hematological/Lymphatic/Immunilogical: No cervical lymphadenopathy. Cardiovascular: Normal rate, regular rhythm.  No murmurs, rubs, or gallops.  Respiratory: Normal respiratory effort without tachypnea nor retractions. Breath sounds are clear and equal bilaterally. No wheezes/rales/rhonchi. Gastrointestinal: Soft and non tender. No rebound. No guarding.  Genitourinary: Deferred Musculoskeletal: Normal range of motion in all extremities. No lower extremity edema. Neurologic:  Normal speech and language. No gross focal neurologic deficits are appreciated.  Skin:  Skin is warm, dry and intact. No rash noted. Psychiatric: Mood and affect are normal. Speech and behavior are normal. Patient exhibits appropriate insight and judgment.  ____________________________________________    LABS (pertinent positives/negatives)  CBC wbc 2.1, hgb 9.9, plt 94 BMP na 139, k 4.1, glu 104, cr 1.39  ____________________________________________   EKG  None  ____________________________________________    RADIOLOGY  None  ____________________________________________   PROCEDURES  Procedures  ____________________________________________   INITIAL IMPRESSION / ASSESSMENT AND PLAN / ED  COURSE  Pertinent labs & imaging results that were available during my care of the patient were reviewed by me and considered in my medical decision making (see chart for details).   Patient presented to the emergency department today because of concern for continued weakness. Patient was just discharged from the hospital earlier today. Blood work consistent with blood work at time of discharge. Will have patient be evaluated by SW to look into options, however did discuss with patient they might not be able to place him in a living facility.   ____________________________________________   FINAL CLINICAL IMPRESSION(S) / ED DIAGNOSES  Final diagnoses:  Weakness     Note: This dictation was prepared with Dragon dictation. Any transcriptional errors that result from this process are unintentional     Nance Pear, MD 09/11/18 [phone]

## 2018-09-11 NOTE — ED Notes (Signed)
Pt eating dinner

## 2018-09-11 NOTE — Progress Notes (Signed)
Discharge instructions given to pt. Pt verbalizes understanding. States that he will have assistance to get prescriptions once he gets home.

## 2018-09-11 NOTE — Progress Notes (Signed)
Pt discharge home via EMS. Handoff report given to EMS personnel. [name] POA notified pt has been discharged.

## 2018-09-12 ENCOUNTER — Emergency Department: Payer: Medicare Other

## 2018-09-12 ENCOUNTER — Other Ambulatory Visit: Payer: Self-pay

## 2018-09-12 ENCOUNTER — Inpatient Hospital Stay: Payer: Medicare Other

## 2018-09-12 DIAGNOSIS — I482 Chronic atrial fibrillation, unspecified: Secondary | ICD-10-CM | POA: Diagnosis present

## 2018-09-12 DIAGNOSIS — R531 Weakness: Secondary | ICD-10-CM | POA: Diagnosis present

## 2018-09-12 DIAGNOSIS — Z7982 Long term (current) use of aspirin: Secondary | ICD-10-CM | POA: Diagnosis not present

## 2018-09-12 DIAGNOSIS — B961 Klebsiella pneumoniae [K. pneumoniae] as the cause of diseases classified elsewhere: Secondary | ICD-10-CM | POA: Diagnosis present

## 2018-09-12 DIAGNOSIS — R29708 NIHSS score 8: Secondary | ICD-10-CM | POA: Diagnosis present

## 2018-09-12 DIAGNOSIS — Y92009 Unspecified place in unspecified non-institutional (private) residence as the place of occurrence of the external cause: Secondary | ICD-10-CM | POA: Diagnosis not present

## 2018-09-12 DIAGNOSIS — Z833 Family history of diabetes mellitus: Secondary | ICD-10-CM | POA: Diagnosis not present

## 2018-09-12 DIAGNOSIS — S32029A Unspecified fracture of second lumbar vertebra, initial encounter for closed fracture: Secondary | ICD-10-CM | POA: Diagnosis present

## 2018-09-12 DIAGNOSIS — R61 Generalized hyperhidrosis: Secondary | ICD-10-CM | POA: Diagnosis present

## 2018-09-12 DIAGNOSIS — Z8249 Family history of ischemic heart disease and other diseases of the circulatory system: Secondary | ICD-10-CM | POA: Diagnosis not present

## 2018-09-12 DIAGNOSIS — I110 Hypertensive heart disease with heart failure: Secondary | ICD-10-CM | POA: Diagnosis present

## 2018-09-12 DIAGNOSIS — I6350 Cerebral infarction due to unspecified occlusion or stenosis of unspecified cerebral artery: Secondary | ICD-10-CM | POA: Diagnosis not present

## 2018-09-12 DIAGNOSIS — W19XXXA Unspecified fall, initial encounter: Secondary | ICD-10-CM | POA: Diagnosis present

## 2018-09-12 DIAGNOSIS — W010XXA Fall on same level from slipping, tripping and stumbling without subsequent striking against object, initial encounter: Secondary | ICD-10-CM | POA: Diagnosis present

## 2018-09-12 DIAGNOSIS — M5136 Other intervertebral disc degeneration, lumbar region: Secondary | ICD-10-CM | POA: Diagnosis present

## 2018-09-12 DIAGNOSIS — G8194 Hemiplegia, unspecified affecting left nondominant side: Secondary | ICD-10-CM | POA: Diagnosis present

## 2018-09-12 DIAGNOSIS — Z880 Allergy status to penicillin: Secondary | ICD-10-CM | POA: Diagnosis not present

## 2018-09-12 DIAGNOSIS — I5022 Chronic systolic (congestive) heart failure: Secondary | ICD-10-CM | POA: Diagnosis present

## 2018-09-12 DIAGNOSIS — Z66 Do not resuscitate: Secondary | ICD-10-CM | POA: Diagnosis present

## 2018-09-12 DIAGNOSIS — I639 Cerebral infarction, unspecified: Secondary | ICD-10-CM | POA: Diagnosis not present

## 2018-09-12 DIAGNOSIS — Z823 Family history of stroke: Secondary | ICD-10-CM | POA: Diagnosis not present

## 2018-09-12 DIAGNOSIS — I480 Paroxysmal atrial fibrillation: Secondary | ICD-10-CM | POA: Diagnosis present

## 2018-09-12 DIAGNOSIS — N390 Urinary tract infection, site not specified: Secondary | ICD-10-CM | POA: Diagnosis present

## 2018-09-12 DIAGNOSIS — N179 Acute kidney failure, unspecified: Secondary | ICD-10-CM | POA: Diagnosis present

## 2018-09-12 LAB — LIPID PANEL
CHOL/HDL RATIO: 5.6 ratio
Cholesterol: 156 mg/dL (ref 0–200)
HDL: 28 mg/dL — ABNORMAL LOW (ref >40)
LDL Cholesterol: 108 mg/dL — ABNORMAL HIGH (ref 0–99)
Triglycerides: 99 mg/dL (ref <150)
VLDL: 20 mg/dL (ref 0–40)

## 2018-09-12 LAB — URINE CULTURE

## 2018-09-12 LAB — HEMOGLOBIN AND HEMATOCRIT, BLOOD
HEMATOCRIT: 29.6 % — AB (ref 39.0–52.0)
Hemoglobin: 9.4 g/dL — ABNORMAL LOW (ref 13.0–17.0)

## 2018-09-12 LAB — TSH: TSH: 3.993 u[IU]/mL (ref 0.350–4.500)

## 2018-09-12 MED ORDER — FUROSEMIDE 20 MG PO TABS
20.0000 mg | ORAL_TABLET | Freq: Every day | ORAL | Status: DC
  Administered 2018-09-13 – 2018-09-15 (×3): 20 mg via ORAL
  Filled 2018-09-12 (×4): qty 1

## 2018-09-12 MED ORDER — ASPIRIN 81 MG PO CHEW
81.0000 mg | CHEWABLE_TABLET | Freq: Once | ORAL | Status: AC
  Administered 2018-09-12: 81 mg via ORAL
  Filled 2018-09-12: qty 1

## 2018-09-12 MED ORDER — IBUPROFEN 600 MG PO TABS
600.0000 mg | ORAL_TABLET | Freq: Once | ORAL | Status: AC
  Administered 2018-09-12: 600 mg via ORAL
  Filled 2018-09-12: qty 1

## 2018-09-12 MED ORDER — ONDANSETRON HCL 4 MG PO TABS: 4.0000 mg | ORAL_TABLET | Freq: Four times a day (QID) | ORAL | Status: DC | PRN

## 2018-09-12 MED ORDER — ONDANSETRON HCL 4 MG/2ML IJ SOLN: 4.0000 mg | Freq: Four times a day (QID) | INTRAMUSCULAR | Status: DC | PRN

## 2018-09-12 MED ORDER — TAMSULOSIN HCL 0.4 MG PO CAPS
0.4000 mg | ORAL_CAPSULE | Freq: Every day | ORAL | Status: DC
  Administered 2018-09-12 – 2018-09-15 (×4): 0.4 mg via ORAL
  Filled 2018-09-12 (×4): qty 1

## 2018-09-12 MED ORDER — CARVEDILOL 3.125 MG PO TABS
12.5000 mg | ORAL_TABLET | Freq: Two times a day (BID) | ORAL | Status: DC
  Administered 2018-09-13 – 2018-09-15 (×5): 12.5 mg via ORAL
  Filled 2018-09-12 (×5): qty 4

## 2018-09-12 MED ORDER — CEPHALEXIN 500 MG PO CAPS
500.0000 mg | ORAL_CAPSULE | Freq: Three times a day (TID) | ORAL | Status: DC
  Administered 2018-09-12 – 2018-09-13 (×3): 500 mg via ORAL
  Filled 2018-09-12 (×3): qty 1

## 2018-09-12 MED ORDER — ATORVASTATIN CALCIUM 20 MG PO TABS
40.0000 mg | ORAL_TABLET | Freq: Every day | ORAL | Status: DC
  Administered 2018-09-12 – 2018-09-14 (×3): 40 mg via ORAL
  Filled 2018-09-12 (×4): qty 2

## 2018-09-12 MED ORDER — ACETAMINOPHEN 650 MG RE SUPP: 650.0000 mg | Freq: Four times a day (QID) | RECTAL | Status: DC | PRN

## 2018-09-12 MED ORDER — ENOXAPARIN SODIUM 40 MG/0.4ML ~~LOC~~ SOLN
40.0000 mg | SUBCUTANEOUS | Status: DC
  Filled 2018-09-12: qty 0.4

## 2018-09-12 MED ORDER — ASPIRIN EC 81 MG PO TBEC
81.0000 mg | DELAYED_RELEASE_TABLET | Freq: Every day | ORAL | Status: DC
  Administered 2018-09-13 – 2018-09-15 (×3): 81 mg via ORAL
  Filled 2018-09-12 (×3): qty 1

## 2018-09-12 MED ORDER — CARVEDILOL 3.125 MG PO TABS: 12.5000 mg | ORAL_TABLET | Freq: Two times a day (BID) | ORAL | Status: DC

## 2018-09-12 MED ORDER — ACETAMINOPHEN 500 MG PO TABS
1000.0000 mg | ORAL_TABLET | Freq: Once | ORAL | Status: AC
  Administered 2018-09-12: 1000 mg via ORAL
  Filled 2018-09-12: qty 2

## 2018-09-12 MED ORDER — ACETAMINOPHEN 325 MG PO TABS
650.0000 mg | ORAL_TABLET | Freq: Four times a day (QID) | ORAL | Status: DC | PRN
  Administered 2018-09-12: 650 mg via ORAL
  Filled 2018-09-12: qty 2

## 2018-09-12 NOTE — H&P (Signed)
[facility] [place] NAME: Aaron Harrison    MR#:  [id number]  DATE OF BIRTH:  1951-04-09  DATE OF ADMISSION:  09/11/2018  PRIMARY CARE PHYSICIAN: Donnie Coffin, MD   REQUESTING/REFERRING PHYSICIAN: Dr. Lisa Roca  CHIEF COMPLAINT:   Chief Complaint  Patient presents with  . Weakness    HISTORY OF PRESENT ILLNESS:  Aaron Harrison  is a 67 y.o. male with a known history of chronic systolic congestive heart failure, paroxysmal atrial fibrillation not on anticoagulation due to compliance issues, hypertension and history of lower extremity cellulitis presents to hospital secondary to back pain and confusion. Patient was admitted to our hospital last week and left AMA on 09/06/2018 when he was treated for CHF exacerbation.  3 days ago he had a fall at home, he says he slipped on spilled water on the floor.  He landed on his lower back, denies hitting his head or losing consciousness.  He states he has been weak for almost 2 weeks, patient had a CT of his head and C-spine done on 09/09/2018 after his fall and he did not show any acute findings.  He states it was his low back pain after the fall that has been preventing him from walking, he was in the ED yesterday, was noted to be very slow while walking but he wanted to go home and felt like his friends and neighbors can help him with things around the house.  Continues to complain of low back pain and CT of the lumbar spine showing L2 compression fracture.  When the nurse tried to get him to the bedside commode, he felt like he was dragging his left side and barely could move his left arm.  MRI of the brain now showing watershed infarcts on the right cerebral hemisphere.  PAST MEDICAL HISTORY:   Past Medical History:  Diagnosis Date  . Arrhythmia   . Cellulitis   . Chronic atrial fibrillation    a. not on long term anticoagulation  . Chronic systolic CHF (congestive heart failure) ([place])   .  Pneumonia 2011    PAST SURGICAL HISTORY:   Past Surgical History:  Procedure Laterality Date  . PERIPHERAL VASCULAR CATHETERIZATION N/A 06/27/2015   Procedure: Dialysis/Perma Catheter Insertion;  Surgeon: Algernon Huxley, MD;  Location: [place] [facility];  Service: Cardiovascular;  Laterality: N/A;  . PERIPHERAL VASCULAR CATHETERIZATION Right 07/05/2015   Procedure: Dialysis/Perma Catheter Removal;  Surgeon: Algernon Huxley, MD;  Location: [place] [facility];  Service: Cardiovascular;  Laterality: Right;    SOCIAL HISTORY:   Social History   Tobacco Use  . Smoking status: Never Smoker  . Smokeless tobacco: Never Used  Substance Use Topics  . Alcohol use: No    FAMILY HISTORY:   Family History  Problem Relation Age of Onset  . Diabetes Mother   . Stroke Mother   . Heart failure Father     DRUG ALLERGIES:   Allergies  Allergen Reactions  . Penicillins Nausea And Vomiting    Has patient had a PCN reaction causing immediate rash, facial/tongue/throat swelling, SOB or lightheadedness with hypotension: No Has patient had a PCN reaction causing severe rash involving mucus membranes or skin necrosis: No Has patient had a PCN reaction that required hospitalization: No Has patient had a PCN reaction occurring within the last 10 years: No If all of the above answers are "NO", then may proceed with Cephalosporin use.  REVIEW OF SYSTEMS:   Review of Systems  Constitutional: Negative for chills, fever and malaise/fatigue.  HENT: Negative for ear discharge, ear pain, hearing loss and nosebleeds.   Eyes: Positive for blurred vision. Negative for double vision and photophobia.  Respiratory: Negative for cough, hemoptysis, shortness of breath and wheezing.   Cardiovascular: Negative for chest pain, palpitations, orthopnea and leg swelling.  Gastrointestinal: Negative for abdominal pain, constipation, diarrhea, heartburn, melena, nausea and vomiting.  Genitourinary: Negative for  dysuria, frequency and urgency.  Musculoskeletal: Positive for back pain and myalgias. Negative for neck pain.  Skin: Negative for rash.  Neurological: Positive for sensory change, focal weakness and headaches. Negative for dizziness, tingling, tremors and speech change.  Endo/Heme/Allergies: Does not bruise/bleed easily.  Psychiatric/Behavioral: Negative for depression.    MEDICATIONS AT HOME:   Prior to Admission medications   Medication Sig Start Date End Date Taking? Authorizing Provider  aspirin EC 81 MG tablet Take 81 mg by mouth daily.   Yes [provider]  carvedilol (COREG) 12.5 MG tablet Take 1 tablet (12.5 mg total) by mouth 2 (two) times daily with a meal. Patient not taking: Reported on 09/12/2018 09/11/18   Hillary Bow, MD  furosemide (LASIX) 20 MG tablet Take 1 tablet (20 mg total) by mouth daily. Patient not taking: Reported on 04/02/2016 07/12/15   Gladstone Lighter, MD  oxyCODONE-acetaminophen (PERCOCET) 5-325 MG tablet Take 1 tablet by mouth every 6 (six) hours as needed for severe pain. 09/11/18 09/11/19  Hillary Bow, MD  tamsulosin (FLOMAX) 0.4 MG CAPS capsule Take 1 capsule (0.4 mg total) by mouth daily. Patient not taking: Reported on 09/12/2018 07/12/15   Gladstone Lighter, MD      VITAL SIGNS:  Blood pressure (!) 179/84, pulse 65, temperature 98.1 F (36.7 C), resp. rate 20, height 5\' 8"  (1.727 m), weight 106.8 kg, SpO2 100 %.  PHYSICAL EXAMINATION:   Physical Exam  GENERAL:  67 y.o.-year-old patient lying in the bed with no acute distress.  EYES: Pupils equal, round, reactive to light and accommodation. No scleral icterus. Extraocular muscles intact.  HEENT: Head atraumatic, normocephalic. Oropharynx and nasopharynx clear.  NECK:  Supple, no jugular venous distention. No thyroid enlargement, no tenderness.  LUNGS: Normal breath sounds bilaterally, no wheezing, rales,rhonchi or crepitation. No use of accessory muscles of respiration. Decreased  basilar breath sounds CARDIOVASCULAR: S1, S2 normal. No rubs, or gallops. 2/6 systolic murmur present ABDOMEN: Soft, nontender, nondistended. Bowel sounds present. No organomegaly or mass.  EXTREMITIES: No pedal edema, cyanosis, or clubbing.  NEUROLOGIC: Cranial nerves II through XII are intact. Muscle strength 5/5 in RUE and RLE, 4/5 LLE and 2/5 LUE. Sensation intact. Gait not checked.  PSYCHIATRIC: The patient is alert and oriented x 3.  SKIN: No obvious rash, lesion, or ulcer.   LABORATORY PANEL:   CBC Recent Labs  Lab 09/11/18 1904 09/12/18 1142  WBC 2.1*  --   HGB 9.9* 9.4*  HCT 32.1* 29.6*  PLT 94*  --    ------------------------------------------------------------------------------------------------------------------  Chemistries  Recent Labs  Lab 09/11/18 1904  NA 139  K 4.1  CL 106  CO2 25  GLUCOSE 104*  BUN 21  CREATININE 1.39*  CALCIUM 8.9   ------------------------------------------------------------------------------------------------------------------  Cardiac Enzymes Recent Labs  Lab 09/09/18 1937  TROPONINI <0.03   ------------------------------------------------------------------------------------------------------------------  RADIOLOGY:  Dg Lumbar Spine 2-3 Views  Result Date: 09/10/2018 CLINICAL DATA:  Low back pain after falling a week ago. EXAM: LUMBAR SPINE - 2-3 VIEW COMPARISON:  None. FINDINGS: Two-view  exam shows bony demineralization. No evidence for an acute fracture. Loss of disc height noted L4-5 and L5-S1. SI joints unremarkable. IMPRESSION: Negative. Electronically Signed   By: Misty Stanley M.D.   On: 09/10/2018 21:33   Ct Lumbar Spine Wo Contrast  Result Date: 09/12/2018 CLINICAL DATA:  Low back pain with difficulty walking after a fall today. Initial encounter. EXAM: CT LUMBAR SPINE WITHOUT CONTRAST TECHNIQUE: Multidetector CT imaging of the lumbar spine was performed without intravenous contrast administration. Multiplanar CT  image reconstructions were also generated. COMPARISON:  Lumbar spine radiographs 09/10/2018. Abdominal ultrasound 06/16/2015 FINDINGS: Segmentation: 5 lumbar type vertebrae. Alignment: Mildly exaggerated lumbar lordosis. No listhesis. Vertebrae: L2 superior endplate compression fracture with 15% height loss, acute in appearance with mild paravertebral soft tissue edema. No retropulsion or posterior element fracture. 2.5 cm centrally lucent and peripherally sclerotic lesion in the posterior left ilium, benign in appearance. Paraspinal and other soft tissues: 8.5 cm right upper pole renal cyst (the cyst measures 7 cm on prior ultrasound) abdominal aortic atherosclerosis without aneurysm. Disc levels: Prominent anterior vertebral osteophytes from T11-L3. Mild disc space narrowing at L4-5. Disc bulging at L4-5 without evidence of significant stenosis. Disc bulging at L5-S1 resulting in mild right and moderate left neural foraminal stenosis. No evidence of high-grade spinal stenosis. IMPRESSION: 1. Acute mild L2 compression fracture. 2. Mild lower lumbar disc degeneration. 3.  Aortic Atherosclerosis (ICD10-I70.0). Electronically Signed   By: Logan Bores M.D.   On: 09/12/2018 12:07   Mr Brain Wo Contrast  Result Date: 09/12/2018 CLINICAL DATA:  Confusion. Left-sided back pain and difficulty walking. EXAM: MRI HEAD WITHOUT CONTRAST TECHNIQUE: Multiplanar, multiecho pulse sequences of the brain and surrounding structures were obtained without intravenous contrast. COMPARISON:  Head CT 09/09/2018 FINDINGS: Multiple sequences are mildly to moderately motion degraded. Brain: Patchy acute to early subacute infarcts are present in the right cerebral hemisphere involving frontal lobe, parietal lobe, posterior temporal lobe, and lateral occipital lobe. Numerous infarcts are oriented in a parasagittal distribution in the centrum semiovale extending to the vertex, and some of the cortical infarcts are along ACA/MCA and MCA/PCA  border zones. Punctate acute to early subacute infarcts are also noted in the right external capsule and left caudate head. There is no associated hemorrhage. Small chronic hemorrhages are noted in the left lentiform nucleus and right cerebellum. Elsewhere, scattered T2 hyperintensities in the cerebral white matter bilaterally and pons are nonspecific but compatible with mild chronic small vessel ischemic disease. A remote right caudate infarct is noted with ex vacuo enlargement of the right frontal horn. There is mild global cerebral atrophy. No mass, midline shift, or extra-axial fluid collection is identified. Vascular: Major intracranial vascular flow voids are preserved. Skull and upper cervical spine: Unremarkable bone marrow signal. Sinuses/Orbits: Unremarkable orbits. Small polyps are secretions in the nasal cavity bilaterally. Focal mucosal thickening or small mucous retention cyst in the left maxillary sinus. Clear mastoid air cells. Other: None. IMPRESSION: 1. 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 2. Mild chronic small vessel ischemic disease. Electronically Signed   By: Logan Bores M.D.   On: 09/12/2018 13:19    EKG:   Orders placed or performed during the hospital encounter of 09/09/18  . EKG 12-Lead  . EKG 12-Lead  . ED EKG  . ED EKG    IMPRESSION AND PLAN:   Jovanne Riggenbach  is a 67 y.o. male with a known history of chronic systolic congestive heart failure, paroxysmal atrial fibrillation not on anticoagulation  due to compliance issues, hypertension and history of lower extremity cellulitis presents to hospital secondary to back pain and confusion.  1.  Acute stroke-MRI of the brain showing acute right cerebral infarcts. -Underlying A. fib, high risk for stroke not on anticoagulation. -Admit, neurochecks -Neurology consult.  MRI of the head and neck -Echocardiogram with bubble study.  Continue aspirin for now.  Add statin.  Check lipid panel -We will  need to be started on anticoagulation in 1 week - PT/OT and speech consults  2.  Chronic systolic CHF-well compensated at this time.  Continue oral Lasix at home.  On aspirin and Coreg. -Recently evaluated by cardiology.  Last EF 50 to 55%  3.  Klebsiella UTI-based on cultures from 09/09/2018.  Continue Keflex for 3 days  4.  Paroxysmal A. fib-rate controlled.  On Coreg. -We will need anticoagulation  5.  DVT prophylaxis-Lovenox  6.  L2 Mild Compression fracture-after fall. Pain control, PT consult    All the records are reviewed and case discussed with ED provider. Management plans discussed with the patient, family and they are in agreement.  CODE STATUS: DNR  TOTAL TIME TAKING CARE OF THIS PATIENT: 50 minutes.    Gladstone Lighter M.D on 09/12/2018 at 3:10 PM  Between 7am to 6pm - Pager - [phone]  After 6pm go to [email or web address] - password [email or web address]  Office  [phone]  CC: Primary care physician; Donnie Coffin, MD

## 2018-09-12 NOTE — ED Notes (Signed)
Pt back from mri

## 2018-09-12 NOTE — Clinical Social Work Note (Addendum)
Clinical Social Work Assessment  Patient Details  Name: Aaron Harrison MRN: [id number] Date of Birth: 11/30/1950  Date of referral:  09/12/18               Reason for consult:  Care Management Concerns, Insurance Barriers, Financial Concerns, Frequent Admissions / ED Visits, Emergency planning/management officer, Nutritional therapist, Discharge Planning                Permission sought to share information with:  Case Optician, dispensing granted to share information::  Yes, Verbal Permission Granted  Name::        Agency::     Relationship::     Contact Information:     Housing/Transportation Living arrangements for the past 2 months:  Apartment Source of Information:  Patient, Engineer, materials Patient Interpreter Needed:  None Criminal Activity/Legal Involvement Pertinent to Current Situation/Hospitalization:  No - Comment as needed Significant Relationships:  Friend Lives with:  Self Do you feel safe going back to the place where you live?  Yes Need for family participation in patient care:  Yes (Comment)  Care giving concerns:  Patient lives at [place] complete alone.    Social Worker assessment / plan:  Holiday representative (CSW) received consult for SNF placement. CSW is familiar with patient from his previous visit to the ED on Friday 09/10/18. CSW met with patient and his friend Aaron Harrison [phone] was at bedside. Per Aaron Harrison patient's friend Aaron Harrison is patient's HPOA however he is currently sick and believes he has the flu. CSW explained to Aaron Harrison and patient that patient's Medicare requires a 3 night qualifying inpatient stay in a hospital in order to pay for SNF, which patient has not had. CSW explained private pay SNF options. Per patient he can't afford to private pay for SNF. CSW explained that the only option is for patient to D/C home with home health. RN case manager made arrangements for [facility] home health to come see him at home soon after he discharges. CSW provided  patient with Aaron Harrison from [place] phone number to call when he gets home, with Aaron Harrison's permission. Patient reported that he use to have Medicaid however he lost it. 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 Patient is agreeable to D/C home with home health. Per patient he already has a walker. RN case manager arranged for patient to have a bedside commode. Patient's friend Aaron Harrison will take the bedside commode home today. Per Aaron Harrison between him and Aaron Harrison they can check on patient and make sure he has meals. Per Aaron Harrison he use to work for [employer] and is familiar with the healthcare system. CSW encouraged patient to get established with a PCP and cardiologist because he has not been to a doctor in 3 years. CSW will continue to follow and assist as needed. MD is aware of above.   CSW made an APS report on Friday 09/10/18.    Employment status:  Disabled (Comment on whether or not currently receiving Disability), Part-Time Insurance information:  Medicare PT Recommendations:  Not assessed at this time Information / Referral to community resources:  Other (Comment Required)(Patient has no payer for SNF. )  Patient/Family's Response to care:  Patient is agreeable to D/C home with home health.   Patient/Family's Understanding of and Emotional Response to Diagnosis, Current Treatment, and Prognosis:  Patient was pleasant and thanked CSW for assistance.   Emotional Assessment Appearance:  Appears stated age  Attitude/Demeanor/Rapport:    Affect (typically observed):  Accepting, Adaptable, Pleasant Orientation:  Oriented to Self, Oriented to Place, Oriented to  Time, Oriented to Situation Alcohol / Substance use:  Not Applicable Psych involvement (Current and /or in the community):  No (Comment)  Discharge Needs  Concerns to be addressed:  Discharge Planning Concerns Readmission within the last 30 days:  Yes Current discharge risk:   Dependent with Mobility Barriers to Discharge:  Continued Medical Work up   [employer], Aaron Beets, LCSW 09/12/2018, 12:20 PM

## 2018-09-12 NOTE — NC FL2 (Signed)
[facility] LEVEL OF CARE SCREENING TOOL     IDENTIFICATION  Patient Name: Aaron Harrison Birthdate: Aug 29, 1951 Sex: male Admission Date (Current Location): 09/11/2018  [place] and [place] Number:  Engineering geologist and Address:  [facility], [address], [place], [place] [address]      Provider Number: [id number]  Attending Physician Name and Address:  Gladstone Lighter, MD  Relative Name and Phone Number:       Current Level of Care: Hospital Recommended Level of Care: [place] Prior Approval Number:    Date Approved/Denied:   PASRR Number: [id number])  Discharge Plan: SNF    Current Diagnoses: Patient Active Problem List   Diagnosis Date Noted  . Stroke (cerebrum) ([place]) 09/12/2018  . Fall 09/10/2018  . Back pain 09/10/2018  . Acute on chronic systolic (congestive) heart failure ([place]) 09/05/2018  . Chronic renal insufficiency 07/19/2015  . Mild cognitive impairment 07/06/2015  . NSVT (nonsustained ventricular tachycardia) ([place])   . Liver disease   . Hyperkalemia 06/10/2015  . Ascites 06/10/2015  . Coagulopathy ([place]) 06/10/2015  . Acute on chronic systolic CHF (congestive heart failure) ([place]) 05/11/2015  . Essential hypertension 05/11/2015  . Bilateral edema of lower extremity   . Atrial fibrillation, chronic 04/07/2015  . Stasis ulcer of lower extremity ([place]) 04/07/2015    Orientation RESPIRATION BLADDER Height & Weight     Self, Time, Situation, Place  Normal Incontinent Weight: 235 lb 7.2 oz (106.8 kg) Height:  5\' 8"  (172.7 cm)  BEHAVIORAL SYMPTOMS/MOOD NEUROLOGICAL BOWEL NUTRITION STATUS      Continent Diet(Diet: Regular )  AMBULATORY STATUS COMMUNICATION OF NEEDS Skin   Extensive Assist Verbally Normal                       Personal Care Assistance Level of Assistance  Bathing, Feeding, Dressing Bathing Assistance: Limited assistance Feeding assistance: Independent Dressing  Assistance: Limited assistance     Functional Limitations Info  Sight, Hearing, Speech Sight Info: Adequate Hearing Info: Adequate Speech Info: Adequate    SPECIAL CARE FACTORS FREQUENCY  PT (By licensed PT), OT (By licensed OT)     PT Frequency: (5) OT Frequency: (5)            Contractures      Additional Factors Info  Code Status, Allergies Code Status Info: (DNR ) Allergies Info: (Penicillins)           Current Medications (09/12/2018):  This is the current hospital active medication list Current Facility-Administered Medications  Medication Dose Route Frequency Provider Last Rate Last Dose  . carvedilol (COREG) tablet 12.5 mg  12.5 mg Oral BID WC Lisa Roca, MD      . cephALEXin [facility]) capsule 500 mg  500 mg Oral Q6H Nance Pear, MD   500 mg at 09/12/18 1446   Current Outpatient Medications  Medication Sig Dispense Refill  . aspirin EC 81 MG tablet Take 81 mg by mouth daily.    . carvedilol (COREG) 12.5 MG tablet Take 1 tablet (12.5 mg total) by mouth 2 (two) times daily with a meal. (Patient not taking: Reported on 09/12/2018) 60 tablet 0  . furosemide (LASIX) 20 MG tablet Take 1 tablet (20 mg total) by mouth daily. (Patient not taking: Reported on 04/02/2016) 30 tablet 2  . oxyCODONE-acetaminophen (PERCOCET) 5-325 MG tablet Take 1 tablet by mouth every 6 (six) hours as needed for severe pain. 20 tablet 0  . tamsulosin (FLOMAX)  0.4 MG CAPS capsule Take 1 capsule (0.4 mg total) by mouth daily. (Patient not taking: Reported on 09/12/2018) 30 capsule 2     Discharge Medications: Please see discharge summary for a list of discharge medications.  Relevant Imaging Results:  Relevant Lab Results:   Additional Information (SSN: [id number])  Dvontae Ruan, Veronia Beets, LCSW

## 2018-09-12 NOTE — ED Provider Notes (Signed)
[facility]  I accepted care from Dr. Mable [place], overnight -at shift change. ____________________________________________    LABS (pertinent positives/negatives)  I, Lisa Roca, MD have personally reviewed the lab reports noted below.  Labs Reviewed  CBC WITH DIFFERENTIAL/PLATELET - Abnormal; Notable for the following components:      Result Value   WBC 2.1 (*)    RBC 3.28 (*)    Hemoglobin 9.9 (*)    HCT 32.1 (*)    RDW 16.6 (*)    Platelets 94 (*)    Neutro Abs 1.3 (*)    Lymphs Abs 0.4 (*)    All other components within normal limits  BASIC METABOLIC PANEL - Abnormal; Notable for the following components:   Glucose, Bld 104 (*)    Creatinine, Ser 1.39 (*)    GFR calc non Af Amer 52 (*)    GFR calc Af Amer 60 (*)    All other components within normal limits  HEMOGLOBIN AND HEMATOCRIT, BLOOD - Abnormal; Notable for the following components:   Hemoglobin 9.4 (*)    HCT 29.6 (*)    All other components within normal limits     ____________________________________________    RADIOLOGY All xrays were viewed by me. Imaging interpreted by radiologist.  I, Lisa Roca MD have personally reviewed the imaging report noted below.  CT lumbar spine without contrast:  IMPRESSION: 1. Acute mild L2 compression fracture. 2. Mild lower lumbar disc degeneration. 3.  Aortic Atherosclerosis (ICD10-I70.0).  MRI brain:  IMPRESSION: 1. 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 2. Mild chronic small vessel ischemic disease.  ____________________________________________   PROCEDURES  Procedure(s) performed: None  Procedures  Critical Care performed: None  ____________________________________________   INITIAL IMPRESSION / ASSESSMENT AND PLAN / ED COURSE   Pertinent labs & imaging results that were available during my care of the patient were reviewed by me and considered in my medical decision making (see chart for  details).   I spoke with Mel Almond, [place] she is familiar with this patient's case because she worked with him while he was in the hospital under observation just before he left the hospital yesterday.  Patient does not meet any additional criteria for hospitalization or observation or skilled nursing facility.  Mel Almond was able to update the patient again as was I, PT is already ordered for home and is supposed to come out today.    Merrilee Seashore, family friend, knows this patient very closely, states that he has been acutely different since Monday's fall.  Hes' continued to complain of back pain and difficulty walking from that.  I am to go ahead and CT the spine, I reviewed the x-rays which were negative, but he still has significant pain there which is also limiting him walking.  Is unclear to me whether or not he is actually having some weakness on the left side, he did have a head CT a couple of days ago, so not to repeat that, however given that he is confused and not his mental status baseline per his friend who is also an EMT, I am getting go ahead and MRI his brain.   He did have an episode of watery diarrhea here that looked like it had some blood in it.  When asked patient states that he is had off and on blood in his stool for about a year now.  It started again this past week.  No abdominal pain.  I did repeat his hemoglobin, and  is stable from yesterday.  Sound like he is probably going to need at least outpatient follow-up with GI.  Does not seem to be having an acute life-threatening or emergency cause for hospitalization in regard to the GI bleeding that seems stable over a week.  CT scan of the lumbar spine does show an L2 compression fracture which is certainly why his back is been hurting him so bad.  MRI returned positive for acute strokes.  Discussed with hospitalist for admission.            CONSULTATIONS: Hospitalist for admission.    Patient / Family / Caregiver  informed of clinical course, medical decision-making process, and agree with plan.   ____________________________________________   FINAL CLINICAL IMPRESSION(S) / ED DIAGNOSES  Final diagnoses:  Weakness  Closed compression fracture of L2 lumbar vertebra, initial encounter ([place])  Acute cerebrovascular accident (CVA) ([place])         Lisa Roca, MD 09/12/18 1351

## 2018-09-12 NOTE — ED Notes (Signed)
Pt alert and oriented. NAD. Waiting on placement.  No needs at this time.  Breakfast tray ordered.

## 2018-09-12 NOTE — ED Notes (Addendum)
Pt assisted to bedpan.  Had one small episode diarrhea with blood in it.  Positive hemocult. Dr Reita Cliche aware. Pt reports has had since he fell.

## 2018-09-12 NOTE — ED Notes (Addendum)
Pt able to stand with walker.  Did take about 5 steps with walker and this RN and jacob NT.  Pt had difficulty moving left leg after 5 steps and did have to have bed moved to get patient back in bed.  He reports had difficult moving left leg because of pain not weakness.  When patient back in bed assessed strength, slightly weaker plantar and dorsiflexion on left compared to right. Able to lift both legs off bed.  Has had xrays done prior but no other imaging. Dr Reita Cliche notified of this assessment.  Left arm weaker as well when using walker but is able to use hand.

## 2018-09-12 NOTE — Clinical Social Work Placement (Signed)
   CLINICAL SOCIAL WORK PLACEMENT  NOTE  Date:  09/12/2018  Patient Details  Name: Aaron Harrison MRN: [id number] Date of Birth: 08-Dec-1950  Clinical Social Work is seeking post-discharge placement for this patient at the [place] level of care (*CSW will initial, date and re-position this form in  chart as items are completed):  Yes   Patient/family provided with [place] Work Department's list of facilities offering this level of care within the geographic area requested by the patient (or if unable, by the patient's family).  Yes   Patient/family informed of their freedom to choose among providers that offer the needed level of care, that participate in Medicare, Medicaid or managed care program needed by the patient, have an available bed and are willing to accept the patient.  Yes   Patient/family informed of [place]'s ownership interest in [facility] and [facility], as well as of the fact that they are under no obligation to receive care at these facilities.  PASRR submitted to EDS on       PASRR number received on       Existing PASRR number confirmed on 09/12/18     FL2 transmitted to all facilities in geographic area requested by pt/family on 09/12/18     FL2 transmitted to all facilities within larger geographic area on       Patient informed that his/her managed care company has contracts with or will negotiate with certain facilities, including the following:            Patient/family informed of bed offers received.  Patient chooses bed at       Physician recommends and patient chooses bed at      Patient to be transferred to   on  .  Patient to be transferred to facility by       Patient family notified on   of transfer.  Name of family member notified:        PHYSICIAN       Additional Comment:    _______________________________________________ Sherman Donaldson, Veronia Beets, LCSW 09/12/2018, 2:52 PM

## 2018-09-12 NOTE — Plan of Care (Signed)
Pt admitted from the ED.  VSS.  NIH 6.  No neuro changes since admitted.  Lower back pain with movement only.  Declined pain meds.

## 2018-09-12 NOTE — ED Notes (Signed)
Report called pt on bedpan

## 2018-09-12 NOTE — ED Notes (Signed)
Pt asked for pain medication for lower back pain from sitting in the stretcher. MD notified and received verbal order for tylenol and motrin per [facility].

## 2018-09-12 NOTE — ED Notes (Signed)
Report received. Dr Reita Cliche added on mri, ct and blood work.

## 2018-09-12 NOTE — Progress Notes (Signed)
   [place] at [facility] Day: 0 days Aaron Harrison is a 67 y.o. male presenting with Weakness .   Advance care planning discussed with patient at bedside. All questions in regards to overall condition and expected prognosis answered.  Patient mentioned that he has a DNR in place.  Understands what it means and would like to continue the same CODE STATUS.    CODE STATUS: DNR Time spent: 18 minutes

## 2018-09-12 NOTE — ED Notes (Signed)
Brief placed on pt. Pulled up in bed. Given breakfast meal.

## 2018-09-12 NOTE — Progress Notes (Signed)
Patient wants to go to SNF for short term rehab and has been admitted under inpatient to [facility]. Patient understands that medicare requires a 3 night qualifying inpatient hospital stay in order to pay for SNF. FL2 complete and faxed out.   [employer], LCSW [phone]

## 2018-09-13 ENCOUNTER — Inpatient Hospital Stay (HOSPITAL_COMMUNITY)
Admit: 2018-09-13 | Discharge: 2018-09-13 | Disposition: A | Discharge status: Home / Self Care | Payer: Medicare Other | Attending: Internal Medicine | Admitting: Internal Medicine

## 2018-09-13 DIAGNOSIS — I639 Cerebral infarction, unspecified: Principal | ICD-10-CM

## 2018-09-13 DIAGNOSIS — I6350 Cerebral infarction due to unspecified occlusion or stenosis of unspecified cerebral artery: Secondary | ICD-10-CM

## 2018-09-13 LAB — BASIC METABOLIC PANEL
ANION GAP: 7 (ref 5–15)
BUN: 25 mg/dL — AB (ref 8–23)
CO2: 25 mmol/L (ref 22–32)
Calcium: 8.4 mg/dL — ABNORMAL LOW (ref 8.9–10.3)
Chloride: 104 mmol/L (ref 98–111)
Creatinine, Ser: 1.32 mg/dL — ABNORMAL HIGH (ref 0.61–1.24)
GFR calc Af Amer: >60 mL/min (ref >60)
GFR, EST NON AFRICAN AMERICAN: 55 mL/min — AB (ref >60)
GLUCOSE: 100 mg/dL — AB (ref 70–99)
POTASSIUM: 3.6 mmol/L (ref 3.5–5.1)
Sodium: 136 mmol/L (ref 135–145)

## 2018-09-13 LAB — CBC
HCT: 29.6 % — ABNORMAL LOW (ref 39.0–52.0)
HEMOGLOBIN: 9.3 g/dL — AB (ref 13.0–17.0)
MCH: 30.2 pg (ref 26.0–34.0)
MCHC: 31.4 g/dL (ref 30.0–36.0)
MCV: 96.1 fL (ref 80.0–100.0)
Platelets: 85 10*3/uL — ABNORMAL LOW (ref 150–400)
RBC: 3.08 MIL/uL — AB (ref 4.22–5.81)
RDW: 16.5 % — ABNORMAL HIGH (ref 11.5–15.5)
WBC: 2.2 10*3/uL — ABNORMAL LOW (ref 4.0–10.5)
nRBC: 0 % (ref 0.0–0.2)

## 2018-09-13 LAB — HEMOGLOBIN A1C
HEMOGLOBIN A1C: 4.7 % — AB (ref 4.8–5.6)
MEAN PLASMA GLUCOSE: 88 mg/dL

## 2018-09-13 MED ORDER — METHYLPREDNISOLONE 4 MG PO TABS
4.0000 mg | ORAL_TABLET | Freq: Two times a day (BID) | ORAL | Status: DC
  Administered 2018-09-13 – 2018-09-15 (×4): 4 mg via ORAL
  Filled 2018-09-13 (×13): qty 1

## 2018-09-13 MED ORDER — CEPHALEXIN 500 MG PO CAPS
500.0000 mg | ORAL_CAPSULE | Freq: Three times a day (TID) | ORAL | Status: DC
  Administered 2018-09-13 – 2018-09-15 (×6): 500 mg via ORAL
  Filled 2018-09-13 (×6): qty 1

## 2018-09-13 MED ORDER — TRAMADOL HCL 50 MG PO TABS
50.0000 mg | ORAL_TABLET | Freq: Four times a day (QID) | ORAL | Status: DC | PRN
  Administered 2018-09-14: 50 mg via ORAL
  Filled 2018-09-13: qty 1

## 2018-09-13 MED ORDER — ORAL CARE MOUTH RINSE
15.0000 mL | Freq: Two times a day (BID) | OROMUCOSAL | Status: DC
  Administered 2018-09-13 – 2018-09-14 (×2): 15 mL via OROMUCOSAL

## 2018-09-13 NOTE — Progress Notes (Signed)
PT Evaluation NOTE  Assessment: Pt admitted with above diagnosis. Pt currently with functional limitations due to the deficits listed below (see PT Problem List). At baseline, pt ambulating without AD and working as a Oceanographer.  Pt currently requires up to max +2 assist for bed mobility and mod +2 assist for sit<>stand transfer.  Pt limited by back pain and BLE weakness.  Given pt's current mobility status, recommending SNF at d/c.  Pt will benefit from skilled PT to increase their independence and safety with mobility to allow discharge to the venue listed below.      09/13/18 1105  PT Visit Information  Last PT Received On 09/13/18  Assistance Needed +2  History of Present Illness Pt is a 67 y/o M who recently was admitted for CHF and left AMA.  He returned a few days later s/p fall with back and hip pain with imaging negative and was d/c home.  Pt returned again due to persistent weakness and pain with CT head showing several acute R cerebral infarcts and L2 compression fx.  Pt also with UTI.  Pt's PMH includes a-fib, CHF.    Precautions  Precautions Fall;Other (comment)  Precaution Comments Educated pt in back precautions and taught log roll technique this session due to L2 compression fx  Restrictions  Weight Bearing Restrictions No  Home Living  Family/patient expects to be discharged to: Private residence  Living Arrangements Alone  Available Help at Discharge Friend(s);Available PRN/intermittently  Type of Home Apartment  Home Access Elevator  Home Layout One level  Bathroom Shower/Tub Walk-in shower  Bathroom Toilet Handicapped height  Home Equipment Walker - 2 wheels;Grab bars - tub/shower;Shower seat - built in;BSC;Grab bars - toilet  Additional Comments Lives at [employer] retirement apartments.   Prior Function  Level of Independence Independent  Comments Pt reports 2 falls in the past 3 months.  Ambulates without AD at baseline.  Ind with ADLs.  Works as a  Counsellor.   Communication  Communication No difficulties  Pain Assessment  Pain Assessment 0-10  Pain Location Low back  Pain Descriptors / Indicators Aching;Guarding;Grimacing  Pain Intervention(s) Limited activity within patient's tolerance;Monitored during session  Cognition  Arousal/Alertness Awake/alert  Behavior During Therapy Flat affect  Overall Cognitive Status Within Functional Limits for tasks assessed  Upper Extremity Assessment  Upper Extremity Assessment Defer to OT evaluation  Lower Extremity Assessment  Lower Extremity Assessment RLE deficits/detail;LLE deficits/detail  RLE Deficits / Details Strength grossly 3/5  RLE Sensation WNL  LLE Deficits / Details Strength grossly 3/5  LLE Sensation WNL  Cervical / Trunk Assessment  Cervical / Trunk Assessment Other exceptions  Cervical / Trunk Exceptions L2 compression fx  Bed Mobility  Overal bed mobility Needs Assistance  Bed Mobility Rolling;Sidelying to Sit;Sit to Sidelying  Rolling Mod assist  Sidelying to sit HOB elevated;Mod assist  Sit to sidelying Max assist;+2 for physical assistance  General bed mobility comments Requires cues and physical assist for log roll technique which pt performs in incorrect order.  Pt unable to tolerate HOB flat and thus HOB elevated this session.  Pt relies heavily on bed rail.  Pt requires max +2 assist to return to supine from sitting using log roll technique.   Transfers  Overall transfer level Needs assistance  Equipment used Rolling walker (2 wheeled)  Transfers Sit to/from Stand  Sit to Stand Mod assist;+2 physical assistance  General transfer comment Cues for proper hand placement and assist to boost to  standing and assist to control descent to sit EOB.   Ambulation/Gait  General Gait Details Not safe to attempt at this time due to instability and fatigue  Balance  Overall balance assessment Needs assistance;History of Falls  Sitting-balance support  Feet supported;Bilateral upper extremity supported  Sitting balance-Leahy Scale Poor  Sitting balance - Comments Pt initially unable to remain steady sitting EOB due to posterior lean and LOB.  After reaching exercise completed pt able to sit EOB with BUE supported and min guard.   Postural control Posterior lean  Standing balance support Bilateral upper extremity supported;During functional activity  Standing balance-Leahy Scale Poor  Standing balance comment Relies on RW and outside physical assist for static and dynamic activity  General Comments  General comments (skin integrity, edema, etc.) Vitals monitored throughout session.  In supine at start of session: BP 147/76, pulse 65.  In sitting: BP 145/79, pulse 67.  In stading: BP 131/84, pulse 72.  In supine at rest at end of session: 152/70 BP, pulse 64.   Exercises  Exercises Other exercises  Other Exercises  Other Exercises Sitting EOB reaching to promote anterior trunk movement as pt demonstrates posterior lean and LOB initially sitting EOB.  Pt able to hold himself up sitting EOB with BUE support following exercise.   Other Exercises In standing pt instructed in stepping with LLE x3.  Pt reports fatigue and needing to sit following this.    PT - End of Session  Equipment Utilized During Treatment Gait belt  Activity Tolerance Patient limited by fatigue;Patient limited by pain  Patient left in bed;with call bell/phone within reach;with bed alarm set  Nurse Communication Mobility status;Other (comment) (vitals)  PT Assessment  PT Recommendation/Assessment Patient needs continued PT services  PT Visit Diagnosis Pain;Unsteadiness on feet (R26.81);Other abnormalities of gait and mobility (R26.89);Difficulty in walking, not elsewhere classified (R26.2);Repeated falls (R29.6);Muscle weakness (generalized) (M62.81)  Pain - Right/Left  (lower)  Pain - part of body  (back)  PT Problem List Decreased strength;Decreased range of  motion;Decreased activity tolerance;Decreased balance;Decreased mobility;Decreased knowledge of use of DME;Decreased safety awareness;Decreased knowledge of precautions;Pain  Barriers to Discharge Decreased caregiver support  Barriers to Discharge Comments Lives alone  PT Plan  PT Frequency (ACUTE ONLY) 7X/week  PT Treatment/Interventions (ACUTE ONLY) DME instruction;Gait training;Functional mobility training;Therapeutic activities;Therapeutic exercise;Balance training;Neuromuscular re-education;Patient/family education;Wheelchair mobility training;Modalities  AM-PAC PT "6 Clicks" Daily Activity Outcome Measure  Difficulty turning over in bed (including adjusting bedclothes, sheets and blankets)? 1  Difficulty moving from lying on back to sitting on the side of the bed?  1  Difficulty sitting down on and standing up from a chair with arms (e.g., wheelchair, bedside commode, etc,.)? 1  Help needed moving to and from a bed to chair (including a wheelchair)? 1  Help needed walking in hospital room? 1  Help needed climbing 3-5 steps with a railing?  1  6 Click Score 6  Mobility G Code  CN  PT Recommendation  Follow Up Recommendations SNF  PT equipment Other (comment) (TBD at next venue of care)  Individuals Consulted  Consulted and Agree with Results and Recommendations Patient  Acute Rehab PT Goals  Patient Stated Goal get back to walking without a walker  PT Goal Formulation With patient  Time For Goal Achievement 09/27/18  Potential to Achieve Goals Fair  PT Time Calculation  PT Start Time (ACUTE ONLY) 1101  PT Stop Time (ACUTE ONLY) 1136  PT Time Calculation (min) (ACUTE ONLY) 35 min  PT General  Charges  $$ ACUTE PT VISIT 1 Visit  PT Evaluation  $PT Eval Moderate Complexity 1 Mod  PT Treatments  $Therapeutic Activity 8-22 mins    Session was performed by student PT, Belva Crome, and directed, overseen, and documented by this PT.  Collie Siad PT, DPT

## 2018-09-13 NOTE — Evaluation (Signed)
Occupational Therapy Evaluation Patient Details Name: Aaron Harrison MRN: [id number] DOB: 1951/03/11 Today's Date: 09/13/2018    History of Present Illness 67 y/o male who was here a few days ago and left AMA.  He now returns having falling 2X at home since 11/11.   history of cardiac arrhythmia, chronic atrial fibrillation, chronic systolic CHF with ejection fraction 35%, chronic kidney disease.  He has a L2 compression fracture and new R CVA.    Clinical Impression   Pt is 67 year old male who has been to the hospital several times including going AMA. He lives in an apartment alone and has an Media planner and no stairs. He returns now after falling twice at home since 11/11 and has a L2 compression fracture and new R CVA. He presents with decreased functional use of LUE and hand with increased flexor tone which he states has been present since an injury when he was a kid in the 1960s but this is not consistent with increased muscle tone and slow movement pattern.  He is able to make a fist with good strength and able to extend fingers slowly and sensation is intact throughout. AAROM in L shoulder flexion to 150 degrees but active movement is about 60 degrees.  Tight IR and ER due to increased tone and wrist also very tight due to increased tone.  He is able to complete grooming, feeding and UB dressing and bathing skills after set up but declined sitting EOB or in recliner due to pain from L2 compression fracture and is max assist for LB dressing and use of bed pan.  He would benefit from instruction and use of reacher, sock aid and LH shoe horn (he has all at home except sock aid). Rec continued OT while in hospital to continue to work on increasing independence in ADLs with AD, balance and functional mobility training, coordination exercises and family ed and training and SNF after discharge.    Follow Up Recommendations  SNF    Equipment Recommendations  Other (comment)(sock aid---he has a reacher  and LH shoe horn)    Recommendations for Other Services       Precautions / Restrictions Precautions Precautions: Fall;Other (comment) Precaution Comments: L2 compression fracture Restrictions Weight Bearing Restrictions: No      Mobility Bed Mobility Overal bed mobility: Needs Assistance Bed Mobility: Rolling;Sidelying to Sit              Transfers Overall transfer level: Needs assistance                    Balance                                           ADL either performed or assessed with clinical judgement   ADL Overall ADL's : Needs assistance/impaired Eating/Feeding: Independent;Set up   Grooming: Wash/dry hands;Wash/dry face;Oral care;Applying deodorant;Brushing hair;Set up;Independent   Upper Body Bathing: Independent;Set up   Lower Body Bathing: Total assistance   Upper Body Dressing : Independent;Set up Upper Body Dressing Details (indicate cue type and reason): for pullover shirts, extra time for buttons due to decreased fine motor but pt states he avoids button up shirts Lower Body Dressing: Maximal assistance;Set up Lower Body Dressing Details (indicate cue type and reason): Pt declined sitting EOB and to get up to recliner due to pain and has been using bed  pan due to pain from L2 compression fracture.  he would benefit from AD for LB dressing which were discussed.   Toilet Transfer Details (indicate cue type and reason): using bed pan and urinal with assist for bed pan from NSG           General ADL Comments: Pt is very limited due to pain from L2 compression fracture.       Vision Baseline Vision/History: No visual deficits;Wears glasses Wears Glasses: At all times Patient Visual Report: No change from baseline       Perception     Praxis      Pertinent Vitals/Pain Pain Assessment: 0-10 Pain Score: 3  Pain Location: Low back Pain Descriptors / Indicators: Aching;Guarding;Grimacing Pain Intervention(s):  Limited activity within patient's tolerance;Monitored during session;Premedicated before session;Repositioned     Hand Dominance Right   Extremity/Trunk Assessment Upper Extremity Assessment Upper Extremity Assessment: LUE deficits/detail LUE Deficits / Details: AROM limited and slow with increased flexor tone.  he reports an injury in 1960s in forearm but this is not consistent with increased tone and tightness throughout LUE and hand.  He is able to open and close L hand with good strength in grip, minimal ER and IR rotation and AAROm to 150 degrees shoulder flexion. Intact sensation. LUE Coordination: decreased fine motor;decreased gross motor   Lower Extremity Assessment Lower Extremity Assessment: Defer to PT evaluation RLE Sensation: WNL LLE Sensation: WNL   Cervical / Trunk Assessment Cervical / Trunk Assessment: Other exceptions Cervical / Trunk Exceptions: L2 compression fx   Communication Communication Communication: No difficulties   Cognition Arousal/Alertness: Awake/alert Behavior During Therapy: Flat affect Overall Cognitive Status: Within Functional Limits for tasks assessed                                 General Comments: Pt appeared intact with verbal responses in addition to orientation.  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   General Comments       Exercises     Shoulder Instructions      Home Living Family/patient expects to be discharged to:: Private residence Living Arrangements: Alone Available Help at Discharge: Friend(s);Available PRN/intermittently Type of Home: Apartment Home Access: Elevator     Home Layout: One level     Bathroom Shower/Tub: Occupational psychologist: Handicapped height     Home Equipment: Environmental consultant - 2 wheels;Grab bars - tub/shower;Shower seat - built in;Bedside commode;Grab bars - toilet   Additional Comments: Lives at [facility] apartments.       Prior  Functioning/Environment Level of Independence: Independent        Comments: Pt reports 2 falls in the past 3 months.  Ambulates without AD at baseline.  Ind with ADLs.  Works as a Counsellor.         OT Problem List: Decreased strength;Decreased range of motion;Decreased activity tolerance;Decreased coordination;Impaired UE functional use;Pain;Impaired tone      OT Treatment/Interventions: Self-care/ADL training;Therapeutic exercise;Patient/family education;Neuromuscular education;DME and/or AE instruction;Therapeutic activities;Manual therapy    OT Goals(Current goals can be found in the care plan section) Acute Rehab OT Goals Patient Stated Goal: regain my independence again  OT Goal Formulation: With patient Time For Goal Achievement: 09/27/18 Potential to Achieve Goals: Good ADL Goals Pt Will Perform Lower Body Dressing: with min assist;with adaptive equipment;sit to/from stand Pt Will Transfer to Toilet: with mod assist;with set-up;stand pivot  transfer;bedside commode Pt Will Perform Toileting - Clothing Manipulation and hygiene: with min assist;sit to/from stand Pt/caregiver will Perform Home Exercise Program: Left upper extremity;With written HEP provided  OT Frequency: Min 2X/week   Barriers to D/C:    lives in an apartment alone       Co-evaluation              AM-PAC PT "6 Clicks" Daily Activity     Outcome Measure Help from another person eating meals?: None Help from another person taking care of personal grooming?: None Help from another person toileting, which includes using toliet, bedpan, or urinal?: A Lot Help from another person bathing (including washing, rinsing, drying)?: A Lot Help from another person to put on and taking off regular upper body clothing?: None Help from another person to put on and taking off regular lower body clothing?: Total 6 Click Score: 17   End of Session    Activity Tolerance: Patient limited by  pain Patient left: in bed;with call bell/phone within reach;with bed alarm set  OT Visit Diagnosis: Pain;Muscle weakness (generalized) (M62.81);Other symptoms and signs involving the nervous system (R29.898) Pain - Right/Left: (bilateral lower back pain)                Time: 0092-3300 OT Time Calculation (min): 30 min Charges:  OT General Charges $OT Visit: 1 Visit OT Evaluation $OT Eval Low Complexity: 1 Low OT Treatments $Self Care/Home Management : 8-22 mins  Chrys Racer, OTR/L ascom [phone] 09/13/18, 11:34 AM

## 2018-09-13 NOTE — Progress Notes (Deleted)
   Patient ID: Aaron Harrison, male    DOB: 1951/09/15, 67 y.o.   MRN: [id number]  HPI  Aaron Harrison is a 67 y/o male with a history of  Echo report from 09/06/18 reviewed and showed an EF of 50-55%.  Admitted 09/11/18 due to      He presents today for a follow-up visit although hasn't been seen since June 2017. He presents with a chief complaint of Review of Systems    Physical Exam    Assessment and Plan:  1: Chronic HF with preserved ejection fraction- - NYHA class  2:

## 2018-09-13 NOTE — Plan of Care (Signed)
  Problem: Education: Goal: Knowledge of General Education information will improve Description Including pain rating scale, medication(s)/side effects and non-pharmacologic comfort measures Outcome: Progressing   Problem: Education: Goal: Knowledge of disease or condition will improve Outcome: Progressing Goal: Knowledge of secondary prevention will improve Outcome: Progressing Goal: Knowledge of patient specific risk factors addressed and post discharge goals established will improve Outcome: Progressing   Problem: Self-Care: Goal: Ability to communicate needs accurately will improve Outcome: Progressing   Problem: Ischemic Stroke/TIA Tissue Perfusion: Goal: Complications of ischemic stroke/TIA will be minimized Outcome: Progressing   Problem: Education: Goal: Knowledge of patient specific risk factors addressed and post discharge goals established will improve Outcome: Progressing

## 2018-09-13 NOTE — Clinical Social Work Note (Signed)
CSW provided bed offers for patient. Patient chose [facility]. CSW notified Claiborne Billings at [facility] of bed acceptance. CSW will continue to follow for discharge planning.  [place], [place]

## 2018-09-13 NOTE — Progress Notes (Signed)
Contacted hospitalist about elevated BP. MD recommends continuing to monitor.

## 2018-09-13 NOTE — Progress Notes (Signed)
[place] [facility]                                                                                                                                                                                  Patient Demographics   Aaron Harrison, is a 67 y.o. male, DOB - 1950-11-23, JKD:[id number]  Admit date - 09/11/2018   Admitting Physician Gladstone Lighter, MD  Outpatient Primary MD for the patient is [name], [name], MD   LOS - 1  Subjective: Has trouble with moving his right upper extremity Still has some back pain   Review of Systems:   CONSTITUTIONAL: No documented fever. No fatigue, weakness. No weight gain, no weight loss.  EYES: No blurry or double vision.  ENT: No tinnitus. No postnasal drip. No redness of the oropharynx.  RESPIRATORY: No cough, no wheeze, no hemoptysis. No dyspnea.  CARDIOVASCULAR: No chest pain. No orthopnea. No palpitations. No syncope.  GASTROINTESTINAL: No nausea, no vomiting or diarrhea. No abdominal pain. No melena or hematochezia.  GENITOURINARY: No dysuria or hematuria.  ENDOCRINE: No polyuria or nocturia. No heat or cold intolerance.  HEMATOLOGY: No anemia. No bruising. No bleeding.  INTEGUMENTARY: No rashes. No lesions.  MUSCULOSKELETAL: No arthritis. No swelling. No gout.  NEUROLOGIC: Right upper extremity weakness PSYCHIATRIC: No anxiety. No insomnia. No ADD.    Vitals:   Vitals:   09/13/18 0011 09/13/18 0209 09/13/18 0416 09/13/18 0932  BP: (!) 178/79 (!) 182/80 (!) 173/64 (!) 146/57  Pulse: 73 69 65 72  Resp:  19 18 18   Temp: 98.8 F (37.1 C) 98.7 F (37.1 C) 97.9 F (36.6 C) 98.1 F (36.7 C)  TempSrc: Oral Oral Oral Oral  SpO2: 97% 97% 99% 99%  Weight:      Height:        Wt Readings from Last 3 Encounters:  09/11/18 106.8 kg  09/12/18 106.6 kg  09/09/18 106.8 kg     Intake/Output Summary (Last 24 hours) at 09/13/2018 1452 Last data filed at 09/13/2018 0934 Gross per 24 hour  Intake 240 ml   Output 100 ml  Net 140 ml    Physical Exam:   GENERAL: Pleasant-appearing in no apparent distress.  HEAD, EYES, EARS, NOSE AND THROAT: Atraumatic, normocephalic. Extraocular muscles are intact. Pupils equal and reactive to light. Sclerae anicteric. No conjunctival injection. No oro-pharyngeal erythema.  NECK: Supple. There is no jugular venous distention. No bruits, no lymphadenopathy, no thyromegaly.  HEART: Regular rate and rhythm,. No murmurs, no rubs, no clicks.  LUNGS: Clear to auscultation bilaterally. No rales or rhonchi. No wheezes.  ABDOMEN: Soft, flat, nontender, nondistended. Has good bowel sounds. No hepatosplenomegaly appreciated.  EXTREMITIES: No evidence of any cyanosis, clubbing, or peripheral edema.  +2 pedal and radial pulses bilaterally.  NEUROLOGIC: The patient is alert, awake, and oriented x3 with right upper extremity 4 out of 5 SKIN: Moist and warm with no rashes appreciated.  Psych: Not anxious, depressed LN: No inguinal LN enlargement    Antibiotics   Anti-infectives (From admission, onward)   Start     Dose/Rate Route Frequency Ordered Stop   09/13/18 1600  cephALEXin (KEFLEX) capsule 500 mg     500 mg Oral 3 times daily 09/13/18 1212 09/17/18 1559   09/12/18 1600  cephALEXin (KEFLEX) capsule 500 mg  Status:  Discontinued     500 mg Oral 3 times daily 09/12/18 1522 09/13/18 1212   09/11/18 2000  cephALEXin (KEFLEX) capsule 500 mg  Status:  Discontinued     500 mg Oral Every 6 hours 09/11/18 1957 09/12/18 1528      Medications   Scheduled Meds: . aspirin EC  81 mg Oral Daily  . atorvastatin  40 mg Oral q1800  . carvedilol  12.5 mg Oral BID WC  . cephALEXin  500 mg Oral TID  . furosemide  20 mg Oral Daily  . tamsulosin  0.4 mg Oral Daily   Continuous Infusions: PRN Meds:.acetaminophen **OR** acetaminophen, ondansetron **OR** ondansetron (ZOFRAN) IV, traMADol   Data Review:   Micro Results Recent Results (from the past 240 hour(s))  Urine  culture     Status: Abnormal   Collection Time: 09/09/18 11:23 PM  Result Value Ref Range Status   Specimen Description   Final    URINE, RANDOM Performed at [facility], [address] [place], [place] [address]    Special Requests   Final    NONE Performed at [facility], [address] [place], [place] [address]    Culture >=100,000 COLONIES/mL KLEBSIELLA PNEUMONIAE (A)  Final   Report Status 09/12/2018 FINAL  Final   Organism ID, Bacteria KLEBSIELLA PNEUMONIAE (A)  Final      Susceptibility   Klebsiella pneumoniae - MIC*    AMPICILLIN >=32 RESISTANT Resistant     CEFAZOLIN <=4 SENSITIVE Sensitive     CEFTRIAXONE <=1 SENSITIVE Sensitive     CIPROFLOXACIN 2 INTERMEDIATE Intermediate     GENTAMICIN <=1 SENSITIVE Sensitive     IMIPENEM 0.5 SENSITIVE Sensitive     NITROFURANTOIN 128 RESISTANT Resistant     TRIMETH/SULFA <=20 SENSITIVE Sensitive     AMPICILLIN/SULBACTAM 4 SENSITIVE Sensitive     PIP/TAZO <=4 SENSITIVE Sensitive     Extended ESBL NEGATIVE Sensitive     * >=100,000 COLONIES/mL KLEBSIELLA PNEUMONIAE    Radiology Reports Dg Chest 2 View  Result Date: 09/06/2018 CLINICAL DATA:  Shortness of breath. EXAM: CHEST - 2 VIEW COMPARISON:  09/05/2018. FINDINGS: Trachea is midline. Heart is enlarged. Mild interstitial prominence and indistinctness, similar to yesterday's exam. No pleural fluid. Flowing anterior osteophytosis in the thoracic spine. IMPRESSION: Mild edema, stable. Electronically Signed   By: Lorin Picket M.D.   On: 09/06/2018 07:43   Dg Chest 2 View  Result Date: 09/05/2018 CLINICAL DATA:  Patient with elevated blood pressure. EXAM: CHEST - 2 VIEW COMPARISON:  Chest radiograph 07/07/2015 FINDINGS: Stable cardiomegaly. Pulmonary vascular redistribution and bilateral interstitial pulmonary opacities. No pleural effusion or pneumothorax. Thoracic spine degenerative changes. IMPRESSION: Cardiomegaly, pulmonary vascular redistribution and  mild interstitial edema. Electronically Signed   By: Lovey Newcomer M.D.   On: 09/05/2018 14:06   Dg Lumbar Spine 2-3  Views  Result Date: 09/10/2018 CLINICAL DATA:  Low back pain after falling a week ago. EXAM: LUMBAR SPINE - 2-3 VIEW COMPARISON:  None. FINDINGS: Two-view exam shows bony demineralization. No evidence for an acute fracture. Loss of disc height noted L4-5 and L5-S1. SI joints unremarkable. IMPRESSION: Negative. Electronically Signed   By: Misty Stanley M.D.   On: 09/10/2018 21:33   Dg Pelvis 1-2 Views  Result Date: 09/09/2018 CLINICAL DATA:  Weakness after fall this morning. EXAM: PELVIS - 1-2 VIEW COMPARISON:  06/22/2015 FINDINGS: Moderate joint space narrowing of both hips. No pelvic fracture or diastasis. Proximal femora appear intact. No joint dislocations. Soft tissues are unremarkable. 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   On: 09/09/2018 20:10   Ct Head Wo Contrast  Result Date: 09/09/2018 CLINICAL DATA:  Patient fell this morning and has increased weakness. EXAM: CT HEAD WITHOUT CONTRAST TECHNIQUE: Contiguous axial images were obtained from the base of the skull through the vertex without intravenous contrast. COMPARISON:  04/29/2011 FINDINGS: Brain: Chronic involutional changes of brain with microvascular ischemic disease. 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 No intra-axial mass, hemorrhage or midline shift. No large vascular territory infarct. Midline fourth ventricle basal cisterns without effacement. Intact brainstem and cerebellum. Remote right caudate lacunar infarct. Vascular: No hyperdense vessel sign. Atherosclerosis of the carotid siphons. Skull: Intact Sinuses/Orbits: Stigmata of chronic sinusitis with thickened maxillary sinus walls. Mild ethmoid sinus mucosal thickening. Polypoid soft tissue densities within the nasal passages may represent small nasal  polyps. Intact orbits. Other: None IMPRESSION: Atrophy with chronic microvascular ischemia. No acute intracranial abnormality. Electronically Signed   By: Ashley Royalty M.D.   On: 09/09/2018 20:07   Ct Lumbar Spine Wo Contrast  Result Date: 09/12/2018 CLINICAL DATA:  Low back pain with difficulty walking after a fall today. Initial encounter. EXAM: CT LUMBAR SPINE WITHOUT CONTRAST TECHNIQUE: Multidetector CT imaging of the lumbar spine was performed without intravenous contrast administration. Multiplanar CT image reconstructions were also generated. COMPARISON:  Lumbar spine radiographs 09/10/2018. Abdominal ultrasound 06/16/2015 FINDINGS: Segmentation: 5 lumbar type vertebrae. Alignment: Mildly exaggerated lumbar lordosis. No listhesis. Vertebrae: L2 superior endplate compression fracture with 15% height loss, acute in appearance with mild paravertebral soft tissue edema. No retropulsion or posterior element fracture. 2.5 cm centrally lucent and peripherally sclerotic lesion in the posterior left ilium, benign in appearance. Paraspinal and other soft tissues: 8.5 cm right upper pole renal cyst (the cyst measures 7 cm on prior ultrasound) abdominal aortic atherosclerosis without aneurysm. Disc levels: Prominent anterior vertebral osteophytes from T11-L3. Mild disc space narrowing at L4-5. Disc bulging at L4-5 without evidence of significant stenosis. Disc bulging at L5-S1 resulting in mild right and moderate left neural foraminal stenosis. No evidence of high-grade spinal stenosis. IMPRESSION: 1. Acute mild L2 compression fracture. 2. Mild lower lumbar disc degeneration. 3.  Aortic Atherosclerosis (ICD10-I70.0). Electronically Signed   By: Logan Bores M.D.   On: 09/12/2018 12:07   Mr Jodene Nam Head Wo Contrast  Result Date: 09/12/2018 CLINICAL DATA:  67 year old male status post brain MRI earlier today demonstrating acute right hemisphere infarcts in a watershed distribution. EXAM: MRA HEAD WITHOUT CONTRAST MRA  NECK WITHOUT CONTRAST TECHNIQUE: Angiographic images of the Circle of Willis were obtained using MRA technique without intravenous contrast. Angiographic images of the neck were obtained using MRA technique without intravenous contrast. Carotid stenosis measurements (when applicable) are obtained utilizing NASCET criteria, using the distal internal  carotid diameter as the denominator. COMPARISON:  Brain MRI  1245 hours today. FINDINGS: MRA NECK FINDINGS Time-of-flight MRA images are substantially degraded by motion despite repeated imaging attempts. Antegrade flow is detected in both common carotid and cervical internal carotid arteries. Antegrade flow is detected in both mid cervical vertebral arteries, the left appears dominant. No additional arterial detail is available. MRA HEAD FINDINGS Intracranial MRA images are of better diagnostic quality but still degraded by motion despite repeated imaging attempts. Antegrade flow in codominant appearing distal vertebral arteries with patent vertebrobasilar junction and basilar artery. No evidence of vertebral or basilar stenosis. SCA and PCA origins are patent. Symmetric appearing P2 segment flow signal. PCA branch detail is degraded. Antegrade flow in both ICA siphons to the carotid termini. The MCA and ACA origins are patent. There is symmetric appearing bilateral ACA A2 and distal enhancement. The left MCA bifurcates early. The right MCA bifurcation is patent. There is symmetric appearing MCA M2 enhancement. IMPRESSION: MRA NECK: Limited diagnostic quality due to motion despite repeated imaging attempts. Both cervical carotid and vertebral arteries appear patent. MRA HEAD: Better image quality but circle-of-Willis branch detail is degraded by motion despite repeated imaging attempts. Negative for large vessel or 2nd order branch occlusion. Electronically Signed   By: Genevie Ann M.D.   On: 09/12/2018 20:22   Mr Jodene Nam Neck Wo Contrast  Result Date: 09/12/2018 CLINICAL  DATA:  66 year old male status post brain MRI earlier today demonstrating acute right hemisphere infarcts in a watershed distribution. EXAM: MRA HEAD WITHOUT CONTRAST MRA NECK WITHOUT CONTRAST TECHNIQUE: Angiographic images of the Circle of Willis were obtained using MRA technique without intravenous contrast. Angiographic images of the neck were obtained using MRA technique without intravenous contrast. Carotid stenosis measurements (when applicable) are obtained utilizing NASCET criteria, using the distal internal carotid diameter as the denominator. COMPARISON:  Brain MRI  1245 hours today. FINDINGS: MRA NECK FINDINGS Time-of-flight MRA images are substantially degraded by motion despite repeated imaging attempts. Antegrade flow is detected in both common carotid and cervical internal carotid arteries. Antegrade flow is detected in both mid cervical vertebral arteries, the left appears dominant. No additional arterial detail is available. MRA HEAD FINDINGS Intracranial MRA images are of better diagnostic quality but still degraded by motion despite repeated imaging attempts. Antegrade flow in codominant appearing distal vertebral arteries with patent vertebrobasilar junction and basilar artery. No evidence of vertebral or basilar stenosis. SCA and PCA origins are patent. Symmetric appearing P2 segment flow signal. PCA branch detail is degraded. Antegrade flow in both ICA siphons to the carotid termini. The MCA and ACA origins are patent. There is symmetric appearing bilateral ACA A2 and distal enhancement. The left MCA bifurcates early. The right MCA bifurcation is patent. There is symmetric appearing MCA M2 enhancement. IMPRESSION: MRA NECK: Limited diagnostic quality due to motion despite repeated imaging attempts. Both cervical carotid and vertebral arteries appear patent. MRA HEAD: Better image quality but circle-of-Willis branch detail is degraded by motion despite repeated imaging attempts. Negative for  large vessel or 2nd order branch occlusion. Electronically Signed   By: Genevie Ann M.D.   On: 09/12/2018 20:22   Mr Brain Wo Contrast  Result Date: 09/12/2018 CLINICAL DATA:  Confusion. Left-sided back pain and difficulty walking. EXAM: MRI HEAD WITHOUT CONTRAST TECHNIQUE: Multiplanar, multiecho pulse sequences of the brain and surrounding structures were obtained without intravenous contrast. COMPARISON:  Head CT 09/09/2018 FINDINGS: Multiple sequences are mildly to moderately motion degraded. Brain: Patchy acute to early subacute  infarcts are present in the right cerebral hemisphere involving frontal lobe, parietal lobe, posterior temporal lobe, and lateral occipital lobe. Numerous infarcts are oriented in a parasagittal distribution in the centrum semiovale extending to the vertex, and some of the cortical infarcts are along ACA/MCA and MCA/PCA border zones. Punctate acute to early subacute infarcts are also noted in the right external capsule and left caudate head. There is no associated hemorrhage. Small chronic hemorrhages are noted in the left lentiform nucleus and right cerebellum. Elsewhere, scattered T2 hyperintensities in the cerebral white matter bilaterally and pons are nonspecific but compatible with mild chronic small vessel ischemic disease. A remote right caudate infarct is noted with ex vacuo enlargement of the right frontal horn. There is mild global cerebral atrophy. No mass, midline shift, or extra-axial fluid collection is identified. Vascular: Major intracranial vascular flow voids are preserved. Skull and upper cervical spine: Unremarkable bone marrow signal. Sinuses/Orbits: Unremarkable orbits. Small polyps are secretions in the nasal cavity bilaterally. Focal mucosal thickening or small mucous retention cyst in the left maxillary sinus. Clear mastoid air cells. Other: None. IMPRESSION: 1. 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 2. Mild chronic small vessel  ischemic disease. Electronically Signed   By: Logan Bores M.D.   On: 09/12/2018 13:19     CBC Recent Labs  Lab 09/09/18 1907 09/11/18 0401 09/11/18 1904 09/12/18 1142 09/13/18 0537  WBC 2.1* 1.8* 2.1*  --  2.2*  HGB 9.0* 8.8* 9.9* 9.4* 9.3*  HCT 29.2* 27.7* 32.1* 29.6* 29.6*  PLT 94* 84* 94*  --  85*  MCV 99.0 96.9 97.9  --  96.1  MCH 30.5 30.8 30.2  --  30.2  MCHC 30.8 31.8 30.8  --  31.4  RDW 16.7* 16.4* 16.6*  --  16.5*  LYMPHSABS  --   --  0.4*  --   --   MONOABS  --   --  0.1  --   --   EOSABS  --   --  0.2  --   --   BASOSABS  --   --  0.0  --   --     Chemistries  Recent Labs  Lab 09/09/18 1907 09/10/18 2220 09/11/18 0401 09/11/18 1904 09/13/18 0537  NA 140  --  141 139 136  K 3.9  --  3.5 4.1 3.6  CL 108  --  111 106 104  CO2 24  --  24 25 25   GLUCOSE 105*  --  115* 104* 100*  BUN 29*  --  21 21 25*  CREATININE 1.76* 1.47* 1.34* 1.39* 1.32*  CALCIUM 8.6*  --  8.3* 8.9 8.4*   ------------------------------------------------------------------------------------------------------------------ estimated creatinine clearance is 66.1 mL/min (A) (by C-G formula based on SCr of 1.32 mg/dL (H)). ------------------------------------------------------------------------------------------------------------------ Recent Labs    09/12/18 1132  HGBA1C 4.7*   ------------------------------------------------------------------------------------------------------------------ Recent Labs    09/12/18 1552  CHOL 156  HDL 28*  LDLCALC 108*  TRIG 99  CHOLHDL 5.6   ------------------------------------------------------------------------------------------------------------------ Recent Labs    09/12/18 1132  TSH 3.993   ------------------------------------------------------------------------------------------------------------------ No results for input(s): VITAMINB12, FOLATE, FERRITIN, TIBC, IRON, RETICCTPCT in the last 72 hours.  Coagulation profile No results for  input(s): INR, PROTIME in the last 168 hours.  No results for input(s): DDIMER in the last 72 hours.  Cardiac Enzymes Recent Labs  Lab 09/09/18 1937  TROPONINI <0.03   ------------------------------------------------------------------------------------------------------------------ Invalid input(s): POCBNP    Assessment & Plan   Tatsuo Musial  is  a 68 y.o. male with a known history of chronic systolic congestive heart failure, paroxysmal atrial fibrillation not on anticoagulation due to compliance issues, hypertension and history of lower extremity cellulitis presents to hospital secondary to back pain and confusion.  1.  Acute stroke-MRI of the brain showing acute right cerebral infarcts. -Underlying A. fib, high risk for stroke not on anticoagulation. -Neurology consult appreciated -Echocardiogram with bubble study.  Continue aspirin for now.  -Continue statin -We will need to be started on anticoagulation in 1 week -Patient will need rehab  2.  Chronic systolic CHF-well compensated at this time.  Continue oral Lasix at home.  On aspirin and Coreg. -Recently evaluated by cardiology.  Last EF 50 to 55%  3.  Klebsiella UTI-based on cultures from 09/09/2018.  Continue Keflex for 5 total days  4.  Paroxysmal A. fib-rate controlled.  On Coreg. -We will need anticoagulation  5.  DVT prophylaxis-Lovenox  6.  L2 Mild Compression fracture-after fall. Pain control, PT consult Low-dose prednisone     Code Status Orders  (From admission, onward)         Start     Ordered   09/12/18 1510  Do not attempt resuscitation (DNR)  Continuous    Question Answer Comment  In the event of cardiac or respiratory ARREST Do not call a "code blue"   In the event of cardiac or respiratory ARREST Do not perform Intubation, CPR, defibrillation or ACLS   In the event of cardiac or respiratory ARREST Use medication by any route, position, wound care, and other measures to relive pain and  suffering. May use oxygen, suction and manual treatment of airway obstruction as needed for comfort.   Comments Pt does not want a Permanent Feeding Tube or to be on a ventilator ever again.  He is OK with dialysis, however.      09/12/18 1509        Code Status History    Date Active Date Inactive Code Status Order ID Comments User Context   09/10/2018 2048 09/11/2018 1720 DNR [id number]  Demetrios Loll, MD Inpatient   09/05/2018 2014 09/06/2018 2011 DNR [id number]  Vaughan Basta, MD Inpatient   06/21/2015 1714 07/12/2015 1802 DNR [id number]  Colleen Can, MD Inpatient   06/21/2015 1309 06/21/2015 1714 DNR [id number]  Colleen Can, MD Inpatient   06/15/2015 1719 06/21/2015 1309 Partial Code [id number] Once extubated, he is NOT TO BE RE-INTUBATED Colleen Can, MD Inpatient   06/10/2015 1833 06/15/2015 1712 Full Code [id number]  Demetrios Loll, MD Inpatient   04/07/2015 2236 04/11/2015 1707 Full Code [id number]  Idelle Crouch, MD Inpatient    Advance Directive Documentation     Most Recent Value  Type of Advance Directive  Healthcare Power of Attorney, Living will  Pre-existing out of facility DNR order (yellow form or pink MOST form)  -  "MOST" Form in Place?  -           Consults  neuro  DVT Prophylaxis  Lovenox    Lab Results  Component Value Date   PLT 85 (L) 09/13/2018     Time Spent in minutes   12min Greater than 50% of time spent in care coordination and counseling patient regarding the condition and plan of care.   Dustin Flock M.D on 09/13/2018 at 2:52 PM  Between 7am to 6pm - Pager - [phone]  After 6pm go to [email or web address] - Proofreader  [employer]   Office  [phone]

## 2018-09-13 NOTE — Progress Notes (Signed)
*  PRELIMINARY RESULTS* Echocardiogram 2D Echocardiogram has been performed.  Sherrie Sport 09/13/2018, 1:29 PM

## 2018-09-13 NOTE — Evaluation (Signed)
Clinical/Bedside Swallow Evaluation Patient Details  Name: Aaron Harrison MRN: [id number] Date of Birth: [birth year]  Today's Date: 09/13/2018 Time: SLP Start Time (ACUTE ONLY): 38 SLP Stop Time (ACUTE ONLY): 1250 SLP Time Calculation (min) (ACUTE ONLY): 30 min  Past Medical History:  Past Medical History:  Diagnosis Date  . Arrhythmia   . Cellulitis   . Chronic atrial fibrillation    a. not on long term anticoagulation  . Chronic systolic CHF (congestive heart failure) ([place])   . Pneumonia 2011   Past Surgical History:  Past Surgical History:  Procedure Laterality Date  . PERIPHERAL VASCULAR CATHETERIZATION N/A 06/27/2015   Procedure: Dialysis/Perma Catheter Insertion;  Surgeon: Algernon Huxley, MD;  Location: [facility];  Service: Cardiovascular;  Laterality: N/A;  . PERIPHERAL VASCULAR CATHETERIZATION Right 07/05/2015   Procedure: Dialysis/Perma Catheter Removal;  Surgeon: Algernon Huxley, MD;  Location: [facility];  Service: Cardiovascular;  Laterality: Right;   HPI:  Per admitting H&P: Aaron Harrison  is a 67 y.o. male with a known history of chronic systolic congestive heart failure, paroxysmal atrial fibrillation not on anticoagulation due to compliance issues, hypertension and history of lower extremity cellulitis presents to hospital secondary to back pain and confusion.   Assessment / Plan / Recommendation Clinical Impression  Patient appears to present with functional swallowing abilities at bedside with no overt s/s aspiration with any consistency tested. Oral phase of swallow WFL, noted adequate oral prep/coordination and timely A-P transit. No oral residue noted post swallow. Laryngeal elevation appeared adequate. Vocal quality remained clear throughout evaluation. Patient somewhat impulsive, feeding himself large spoonfuls of puree, but compliant with SLP cues for smaller bites. Patient observed to take multiple large sips of thin liquid without difficulty.  Discussed safe swallow rec's with patient. Patient stated understanding. Recommend continue with current Regular diet with thin liquid with aspiration precautions, give meds whole with thin liquid. SLP to f/u with toleration of diet.   SLP Visit Diagnosis: Dysphagia, unspecified (R13.10)    Aspiration Risk  Mild aspiration risk    Diet Recommendation Regular;Thin liquid   Liquid Administration via: Cup;Straw Medication Administration: Whole meds with liquid Supervision: Patient able to self feed Compensations: Minimize environmental distractions;Slow rate;Small sips/bites Postural Changes: Seated upright at 90 degrees    Other  Recommendations Oral Care Recommendations: Oral care BID   Follow up Recommendations        Frequency and Duration min 1 x/week  1 week       Prognosis Prognosis for Safe Diet Advancement: Good      Swallow Study   General Date of Onset: 09/13/18 HPI: Per admitting H&P: Aaron Harrison  is a 67 y.o. male with a known history of chronic systolic congestive heart failure, paroxysmal atrial fibrillation not on anticoagulation due to compliance issues, hypertension and history of lower extremity cellulitis presents to hospital secondary to back pain and confusion. Type of Study: Bedside Swallow Evaluation Diet Prior to this Study: Regular;Thin liquids Temperature Spikes Noted: Yes Respiratory Status: Room air History of Recent Intubation: No Behavior/Cognition: Alert;Cooperative;Pleasant mood Oral Cavity Assessment: Within Functional Limits Oral Care Completed by SLP: No Oral Cavity - Dentition: Adequate natural dentition Vision: Functional for self-feeding Self-Feeding Abilities: Able to feed self Patient Positioning: Upright in bed Baseline Vocal Quality: Normal    Oral/Motor/Sensory Function Overall Oral Motor/Sensory Function: Within functional limits   Ice Chips Ice chips: Not tested   Thin Liquid Thin Liquid: Within functional  limits Presentation: Cup;Self Fed;Straw  Nectar Thick Nectar Thick Liquid: Not tested   Honey Thick Honey Thick Liquid: Not tested   Puree Puree: Within functional limits Presentation: Self Fed;Spoon   Solid     Solid: Within functional limits      Kyesha Balla, MA, CCC-SLP 09/13/2018,3:52 PM

## 2018-09-13 NOTE — Consult Note (Addendum)
Referring Physician: Fritzi Mandes    Chief Complaint: Left side weakness  HPI: Aaron Harrison is an 67 y.o. male with history of chronic atrial fibrillation not on long-term anticoagulation, congestive heart failure with EF of 50 to 55%, liver disease, arrhythmia, cellulitis, pneumonia, hypertension and recurrent falls presenting to the ED with complaints of left side weakness causing a fall.  He was previously seen in the ED on 09/05/2018 due to CHF exacerbation and left AMA.  He returned to the ED on 09/09/2018 due to fall causing injury to his lower back and generalized weakness.  Imaging was negative therefore patient was discharged home to follow with home health and PT.  He however return to the ED  on 09/11/2018 due to concerns for continued weakness and fall.  Denies associated symptoms of numbness, dizziness, headache, speech difficulty or other associated focal neurologic deficit.  CT lumbar spine showed acute mild L2 compression fracture and mild lower lumbar disc degeneration.  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  Due to acute findings on MRI patient was admitted for further stroke work-up and management.  Initial NIH stroke scale 8  Date last known well: Date: 09/09/2018 Time last known well: Unable to determine tPA Given: No: outside time window   Past Medical History:  Diagnosis Date  . Arrhythmia   . Cellulitis   . Chronic atrial fibrillation    a. not on long term anticoagulation  . Chronic systolic CHF (congestive heart failure) ([place])   . Pneumonia 2011    Past Surgical History:  Procedure Laterality Date  . PERIPHERAL VASCULAR CATHETERIZATION N/A 06/27/2015   Procedure: Dialysis/Perma Catheter Insertion;  Surgeon: Algernon Huxley, MD;  Location: [facility];  Service: Cardiovascular;  Laterality: N/A;  . PERIPHERAL VASCULAR CATHETERIZATION Right 07/05/2015   Procedure: Dialysis/Perma Catheter Removal;   Surgeon: Algernon Huxley, MD;  Location: [facility];  Service: Cardiovascular;  Laterality: Right;    Family History  Problem Relation Age of Onset  . Diabetes Mother   . Stroke Mother   . Heart failure Father    Social History:  reports that he has never smoked. He has never used smokeless tobacco. He reports that he does not drink alcohol or use drugs.  Allergies:  Allergies  Allergen Reactions  . Penicillins Nausea And Vomiting    Has patient had a PCN reaction causing immediate rash, facial/tongue/throat swelling, SOB or lightheadedness with hypotension: No Has patient had a PCN reaction causing severe rash involving mucus membranes or skin necrosis: No Has patient had a PCN reaction that required hospitalization: No Has patient had a PCN reaction occurring within the last 10 years: No If all of the above answers are "NO", then may proceed with Cephalosporin use.     Medications:  I have reviewed the patient's current medications. Prior to Admission:  Medications Prior to Admission  Medication Sig Dispense Refill Last Dose  . aspirin EC 81 MG tablet Take 81 mg by mouth daily.   09/12/2018 at 0800  . carvedilol (COREG) 12.5 MG tablet Take 1 tablet (12.5 mg total) by mouth 2 (two) times daily with a meal. (Patient not taking: Reported on 09/12/2018) 60 tablet 0 Not Taking at Unknown time  . furosemide (LASIX) 20 MG tablet Take 1 tablet (20 mg total) by mouth daily. (Patient not taking: Reported on 04/02/2016) 30 tablet 2 Not Taking at Unknown time  . oxyCODONE-acetaminophen (PERCOCET) 5-325 MG  tablet Take 1 tablet by mouth every 6 (six) hours as needed for severe pain. 20 tablet 0 prn at prn  . tamsulosin (FLOMAX) 0.4 MG CAPS capsule Take 1 capsule (0.4 mg total) by mouth daily. (Patient not taking: Reported on 09/12/2018) 30 capsule 2 Not Taking at Unknown time   Scheduled: . aspirin EC  81 mg Oral Daily  . atorvastatin  40 mg Oral q1800  . carvedilol  12.5 mg Oral BID WC   . cephALEXin  500 mg Oral TID  . enoxaparin (LOVENOX) injection  40 mg Subcutaneous Q24H  . furosemide  20 mg Oral Daily  . tamsulosin  0.4 mg Oral Daily    ROS: History obtained from the patient   General ROS: negative for - chills, fatigue, fever, night sweats, weight gain or weight loss Psychological ROS: negative for - behavioral disorder, hallucinations, memory difficulties, mood swings or suicidal ideation Ophthalmic ROS: negative for -  double vision, eye pain or loss of vision. Positive for: Blurred vision ENT ROS: negative for - epistaxis, nasal discharge, oral lesions, sore throat, tinnitus or vertigo Allergy and Immunology ROS: negative for - hives or itchy/watery eyes Hematological and Lymphatic ROS: negative for - bleeding problems, bruising or swollen lymph nodes Endocrine ROS: negative for - galactorrhea, hair pattern changes, polydipsia/polyuria or temperature intolerance Respiratory ROS: negative for - cough, hemoptysis. Positive for shortness of breath or wheezing Cardiovascular ROS: negative for - chest pain, dyspnea on exertion, edema or irregular heartbeat Gastrointestinal ROS: negative for - abdominal pain, diarrhea, hematemesis, nausea/vomiting or stool incontinence Genito-Urinary ROS: negative for - dysuria, hematuria, incontinence or urinary frequency/urgency Musculoskeletal ROS: negative for - joint swelling.  Positive for muscular weakness, back pain and myalgias Neurological ROS: as noted in HPI Dermatological ROS: negative for rash and skin lesion changes  Physical Examination: Blood pressure (!) 173/64, pulse 65, temperature 97.9 F (36.6 C), temperature source Oral, resp. rate 18, height 5\' 8"  (1.727 m), weight 106.8 kg, SpO2 99 %.   HEENT-  Normocephalic, no lesions, without obvious abnormality.  Normal external eye and conjunctiva.  Normal TM's bilaterally.  Normal auditory canals and external ears. Normal external nose, mucus membranes and septum.   Normal pharynx. Cardiovascular- S1, S2 normal, pulses palpable throughout   Lungs- chest clear, no wheezing, rales, normal symmetric air entry Abdomen- soft, non-tender; bowel sounds normal; no masses,  no organomegaly.  Umbilical hernia Extremities- no edema Lymph-no adenopathy palpable Musculoskeletal-no joint tenderness, deformity or swelling Skin-warm and dry, no hyperpigmentation, vitiligo, or suspicious lesions  Neurological Exam   Mental Status: Alert, oriented, thought content appropriate.  Speech fluent without evidence of aphasia.  Able to follow 3 step commands without difficulty. Attention span and concentration seemed appropriate  Cranial Nerves: II: Discs flat bilaterally; Visual fields grossly normal, pupils equal, round, reactive to light and accommodation III,IV, VI: ptosis not present, extra-ocular motions intact bilaterally V,VII: smile symmetric, facial light touch sensation intact VIII: hearing normal bilaterally IX,X: gag reflex present XI: bilateral shoulder shrug XII: midline tongue extension Motor: Right :  Upper extremity   5/5 Without pronator drift      Left: Upper extremity   3/5 without pronator drift Right:   Lower extremity   5/5                                          Left: Lower extremity  4/5 Tone  and bulk:normal tone throughout; no atrophy noted Sensory: Pinprick and light touch intact bilaterally Deep Tendon Reflexes: 2+ and symmetric throughout Plantars: Right: mute                              Left: mute Cerebellar: Finger-to-nose testing intact on the right. Unable to perform on the left due to weakness Gait: not tested due to safety concerns  Data Reviewed  Laboratory Studies:  Basic Metabolic Panel: Recent Labs  Lab 09/09/18 1907 09/10/18 2220 09/11/18 0401 09/11/18 1904 09/13/18 0537  NA 140  --  141 139 136  K 3.9  --  3.5 4.1 3.6  CL 108  --  111 106 104  CO2 24  --  24 25 25   GLUCOSE 105*  --  115* 104* 100*  BUN 29*  --   21 21 25*  CREATININE 1.76* 1.47* 1.34* 1.39* 1.32*  CALCIUM 8.6*  --  8.3* 8.9 8.4*    Liver Function Tests: No results for input(s): AST, ALT, ALKPHOS, BILITOT, PROT, ALBUMIN in the last 168 hours. No results for input(s): LIPASE, AMYLASE in the last 168 hours. Recent Labs  Lab 09/09/18 1959  AMMONIA 30    CBC: Recent Labs  Lab 09/09/18 1907 09/11/18 0401 09/11/18 1904 09/12/18 1142 09/13/18 0537  WBC 2.1* 1.8* 2.1*  --  2.2*  NEUTROABS  --   --  1.3*  --   --   HGB 9.0* 8.8* 9.9* 9.4* 9.3*  HCT 29.2* 27.7* 32.1* 29.6* 29.6*  MCV 99.0 96.9 97.9  --  96.1  PLT 94* 84* 94*  --  85*    Cardiac Enzymes: Recent Labs  Lab 09/09/18 1937  TROPONINI <0.03    BNP: Invalid input(s): POCBNP  CBG: No results for input(s): GLUCAP in the last 168 hours.  Microbiology: Results for orders placed or performed during the hospital encounter of 09/09/18  Urine culture     Status: Abnormal   Collection Time: 09/09/18 11:23 PM  Result Value Ref Range Status   Specimen Description   Final    URINE, RANDOM Performed at [facility] [address] [place], [place] [address]    Special Requests   Final    NONE Performed at [facility], [address] [place], [place] [address]    Culture >=100,000 COLONIES/mL KLEBSIELLA PNEUMONIAE (A)  Final   Report Status 09/12/2018 FINAL  Final   Organism ID, Bacteria KLEBSIELLA PNEUMONIAE (A)  Final      Susceptibility   Klebsiella pneumoniae - MIC*    AMPICILLIN >=32 RESISTANT Resistant     CEFAZOLIN <=4 SENSITIVE Sensitive     CEFTRIAXONE <=1 SENSITIVE Sensitive     CIPROFLOXACIN 2 INTERMEDIATE Intermediate     GENTAMICIN <=1 SENSITIVE Sensitive     IMIPENEM 0.5 SENSITIVE Sensitive     NITROFURANTOIN 128 RESISTANT Resistant     TRIMETH/SULFA <=20 SENSITIVE Sensitive     AMPICILLIN/SULBACTAM 4 SENSITIVE Sensitive     PIP/TAZO <=4 SENSITIVE Sensitive     Extended ESBL NEGATIVE Sensitive     * >=100,000  COLONIES/mL KLEBSIELLA PNEUMONIAE    Coagulation Studies: No results for input(s): LABPROT, INR in the last 72 hours.  Urinalysis:  Recent Labs  Lab 09/09/18 2323  COLORURINE YELLOW*  LABSPEC 1.017  PHURINE 5.0  GLUCOSEU NEGATIVE  HGBUR SMALL*  BILIRUBINUR NEGATIVE  KETONESUR 5*  PROTEINUR NEGATIVE  NITRITE NEGATIVE  LEUKOCYTESUR SMALL*  Lipid Panel:    Component Value Date/Time   CHOL 156 09/12/2018 1552   TRIG 99 09/12/2018 1552   HDL 28 (L) 09/12/2018 1552   CHOLHDL 5.6 09/12/2018 1552   VLDL 20 09/12/2018 1552   LDLCALC 108 (H) 09/12/2018 1552    HgbA1C: No results found for: HGBA1C  Urine Drug Screen:  No results found for: LABOPIA, COCAINSCRNUR, LABBENZ, AMPHETMU, THCU, LABBARB  Alcohol Level: No results for input(s): ETH in the last 168 hours.  Other results: EKG: normal EKG, normal sinus rhythm, unchanged from previous tracings. Vent. rate 83 BPM PR interval * ms QRS duration 104 ms QT/QTc 439/516 ms P-R-T axes 50 82 25  Imaging: Ct Lumbar Spine Wo Contrast  Result Date: 09/12/2018 CLINICAL DATA:  Low back pain with difficulty walking after a fall today. Initial encounter. EXAM: CT LUMBAR SPINE WITHOUT CONTRAST TECHNIQUE: Multidetector CT imaging of the lumbar spine was performed without intravenous contrast administration. Multiplanar CT image reconstructions were also generated. COMPARISON:  Lumbar spine radiographs 09/10/2018. Abdominal ultrasound 06/16/2015 FINDINGS: Segmentation: 5 lumbar type vertebrae. Alignment: Mildly exaggerated lumbar lordosis. No listhesis. Vertebrae: L2 superior endplate compression fracture with 15% height loss, acute in appearance with mild paravertebral soft tissue edema. No retropulsion or posterior element fracture. 2.5 cm centrally lucent and peripherally sclerotic lesion in the posterior left ilium, benign in appearance. Paraspinal and other soft tissues: 8.5 cm right upper pole renal cyst (the cyst measures 7 cm on  prior ultrasound) abdominal aortic atherosclerosis without aneurysm. Disc levels: Prominent anterior vertebral osteophytes from T11-L3. Mild disc space narrowing at L4-5. Disc bulging at L4-5 without evidence of significant stenosis. Disc bulging at L5-S1 resulting in mild right and moderate left neural foraminal stenosis. No evidence of high-grade spinal stenosis. IMPRESSION: 1. Acute mild L2 compression fracture. 2. Mild lower lumbar disc degeneration. 3.  Aortic Atherosclerosis (ICD10-I70.0). Electronically Signed   By: Logan Bores M.D.   On: 09/12/2018 12:07   Mr Jodene Nam Head Wo Contrast  Result Date: 09/12/2018 CLINICAL DATA:  67 year old male status post brain MRI earlier today demonstrating acute right hemisphere infarcts in a watershed distribution. EXAM: MRA HEAD WITHOUT CONTRAST MRA NECK WITHOUT CONTRAST TECHNIQUE: Angiographic images of the Circle of Willis were obtained using MRA technique without intravenous contrast. Angiographic images of the neck were obtained using MRA technique without intravenous contrast. Carotid stenosis measurements (when applicable) are obtained utilizing NASCET criteria, using the distal internal carotid diameter as the denominator. COMPARISON:  Brain MRI  1245 hours today. FINDINGS: MRA NECK FINDINGS Time-of-flight MRA images are substantially degraded by motion despite repeated imaging attempts. Antegrade flow is detected in both common carotid and cervical internal carotid arteries. Antegrade flow is detected in both mid cervical vertebral arteries, the left appears dominant. No additional arterial detail is available. MRA HEAD FINDINGS Intracranial MRA images are of better diagnostic quality but still degraded by motion despite repeated imaging attempts. Antegrade flow in codominant appearing distal vertebral arteries with patent vertebrobasilar junction and basilar artery. No evidence of vertebral or basilar stenosis. SCA and PCA origins are patent. Symmetric appearing  P2 segment flow signal. PCA branch detail is degraded. Antegrade flow in both ICA siphons to the carotid termini. The MCA and ACA origins are patent. There is symmetric appearing bilateral ACA A2 and distal enhancement. The left MCA bifurcates early. The right MCA bifurcation is patent. There is symmetric appearing MCA M2 enhancement. IMPRESSION: MRA NECK: Limited diagnostic quality due to motion despite repeated imaging attempts. Both cervical carotid and  vertebral arteries appear patent. MRA HEAD: Better image quality but circle-of-Willis branch detail is degraded by motion despite repeated imaging attempts. Negative for large vessel or 2nd order branch occlusion. Electronically Signed   By: Genevie Ann M.D.   On: 09/12/2018 20:22   Mr Jodene Nam Neck Wo Contrast  Result Date: 09/12/2018 CLINICAL DATA:  67 year old male status post brain MRI earlier today demonstrating acute right hemisphere infarcts in a watershed distribution. EXAM: MRA HEAD WITHOUT CONTRAST MRA NECK WITHOUT CONTRAST TECHNIQUE: Angiographic images of the Circle of Willis were obtained using MRA technique without intravenous contrast. Angiographic images of the neck were obtained using MRA technique without intravenous contrast. Carotid stenosis measurements (when applicable) are obtained utilizing NASCET criteria, using the distal internal carotid diameter as the denominator. COMPARISON:  Brain MRI  1245 hours today. FINDINGS: MRA NECK FINDINGS Time-of-flight MRA images are substantially degraded by motion despite repeated imaging attempts. Antegrade flow is detected in both common carotid and cervical internal carotid arteries. Antegrade flow is detected in both mid cervical vertebral arteries, the left appears dominant. No additional arterial detail is available. MRA HEAD FINDINGS Intracranial MRA images are of better diagnostic quality but still degraded by motion despite repeated imaging attempts. Antegrade flow in codominant appearing distal  vertebral arteries with patent vertebrobasilar junction and basilar artery. No evidence of vertebral or basilar stenosis. SCA and PCA origins are patent. Symmetric appearing P2 segment flow signal. PCA branch detail is degraded. Antegrade flow in both ICA siphons to the carotid termini. The MCA and ACA origins are patent. There is symmetric appearing bilateral ACA A2 and distal enhancement. The left MCA bifurcates early. The right MCA bifurcation is patent. There is symmetric appearing MCA M2 enhancement. IMPRESSION: MRA NECK: Limited diagnostic quality due to motion despite repeated imaging attempts. Both cervical carotid and vertebral arteries appear patent. MRA HEAD: Better image quality but circle-of-Willis branch detail is degraded by motion despite repeated imaging attempts. Negative for large vessel or 2nd order branch occlusion. Electronically Signed   By: Genevie Ann M.D.   On: 09/12/2018 20:22   Mr Brain Wo Contrast  Result Date: 09/12/2018 CLINICAL DATA:  Confusion. Left-sided back pain and difficulty walking. EXAM: MRI HEAD WITHOUT CONTRAST TECHNIQUE: Multiplanar, multiecho pulse sequences of the brain and surrounding structures were obtained without intravenous contrast. COMPARISON:  Head CT 09/09/2018 FINDINGS: Multiple sequences are mildly to moderately motion degraded. Brain: Patchy acute to early subacute infarcts are present in the right cerebral hemisphere involving frontal lobe, parietal lobe, posterior temporal lobe, and lateral occipital lobe. Numerous infarcts are oriented in a parasagittal distribution in the centrum semiovale extending to the vertex, and some of the cortical infarcts are along ACA/MCA and MCA/PCA border zones. Punctate acute to early subacute infarcts are also noted in the right external capsule and left caudate head. There is no associated hemorrhage. Small chronic hemorrhages are noted in the left lentiform nucleus and right cerebellum. Elsewhere, scattered T2  hyperintensities in the cerebral white matter bilaterally and pons are nonspecific but compatible with mild chronic small vessel ischemic disease. A remote right caudate infarct is noted with ex vacuo enlargement of the right frontal horn. There is mild global cerebral atrophy. No mass, midline shift, or extra-axial fluid collection is identified. Vascular: Major intracranial vascular flow voids are preserved. Skull and upper cervical spine: Unremarkable bone marrow signal. Sinuses/Orbits: Unremarkable orbits. Small polyps are secretions in the nasal cavity bilaterally. Focal mucosal thickening or small mucous retention cyst in the left maxillary sinus. Clear  mastoid air cells. Other: None. IMPRESSION: 1. 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 2. Mild chronic small vessel ischemic disease. Electronically Signed   By: Logan Bores M.D.   On: 09/12/2018 13:19   Patient seen and examined.  Clinical course and management discussed.  Necessary edits performed.  I agree with the above.  Assessment and plan of care developed and discussed below.    Assessment: 67 y.o. male with history of chronic atrial fibrillation not on long-term anticoagulation, congestive heart failure with EF of 50 to 55%, liver disease, arrhythmia, cellulitis, pneumonia, hypertension and recurrent falls presenting to the ED with complaints of left side weakness causing a fall. MRI brain reviewed and showed acute right hemispheric infarcts.  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  MRA head and neck also reviewed and shows no large vessel occlusion or significant stenosis although largely motion degraded.  Echocardiogram shows no cardiac source of emboli with a EF of 50 to 55%.  Patient has history of atrial fibrillation currently not anticoagulated prior to this event.  LDL 108.  Stroke Risk Factors - atrial fibrillation, family history, hyperlipidemia and hypertension  Plan: 1. HgbA1c pending. Goal A1c<7.0 2. Prophylactic  therapy- Eliquis. Once Eliquis started ASA can be discontinued.   3. PT consult, OT consult, Speech consult 4. Statin with goal LDL <70 5. NPO until RN stroke swallow screen 6. Telemetry monitoring 7. Frequent neuro checks  This patient was staffed with Dr. Magda Paganini, Doy Mince who personally evaluated patient, reviewed documentation and agreed with assessment and plan of care as above.  Rufina Falco, DNP, FNP-BC Board certified Nurse Practitioner Neurology Department  09/13/2018, 9:28 AM  Alexis Goodell, MD Neurology [phone]  09/13/2018  11:58 AM

## 2018-09-14 ENCOUNTER — Ambulatory Visit: Payer: Medicare Other | Admitting: Family

## 2018-09-14 MED ORDER — AMLODIPINE BESYLATE 5 MG PO TABS
5.0000 mg | ORAL_TABLET | Freq: Every day | ORAL | Status: DC
  Administered 2018-09-14 – 2018-09-15 (×2): 5 mg via ORAL
  Filled 2018-09-14 (×3): qty 1

## 2018-09-14 NOTE — Progress Notes (Signed)
SLP Cancellation Note  Patient Details Name: KOAL ESLINGER MRN: [id number] DOB: 1951/05/08   Cancelled Dysphagia treatment:       Reason Eval/Treat Not Completed: SLP screened, no needs identified, will sign off(chart reviewed; BSE completed yesterday, f/u today). Pt and NSG reported he is tolerating his current Regular consistency diet w/ thin liquids w/ no overt s/s of aspiration noted. No decline in swallowing status per NSG. Pt appears to be at his baseline w/ re: to swallowing per observation and report. Recommended to continue general aspiration precautions; Pills swallowed w/ a puree if easier. NSG to reconsult if any decline in status while admitted.     Orinda Kenner, MS, CCC-SLP Watson,Katherine 09/14/2018, 2:36 PM

## 2018-09-14 NOTE — Progress Notes (Signed)
Patient requested SCDs be removed and does not want them back on his legs. Patient becoming increasingly aggitated with process. Patient has not slept most of the night.

## 2018-09-14 NOTE — Progress Notes (Signed)
Physical Therapy Treatment Patient Details Name: Aaron Harrison MRN: [id number] DOB: [birth year] Today's Date: 09/14/2018    History of Present Illness Pt is a 67 y/o M who recently was admitted for CHF and left AMA.  He returned a few days later s/p fall with back and hip pain with imaging negative and was d/c home.  Pt returned again due to persistent weakness and pain with CT head showing several acute R cerebral infarcts and L2 compression fx.  Pt also with UTI.  Pt's PMH includes a-fib, CHF.      PT Comments    Pt agreeable to work with PT.  At start of session pt with similar presentation as yesterday with some delayed responses to questions and commands and possible mild neglect to the L (inconsistent presentation).  Pt able to raise LUE at start of session today.  Pt again following commands throughout session today but with some delay and with repeated cues.  At end of session with pt in standing at bedside pt with gradually decreasing response to cues and pt was assisted back to supine.  Vitals taken during session.  In supine at start of session: 148/80 BP, pulse 58, SpO2 94%.  Sitting pulse 56, spO2 95%.  Supine at end of session: 157/67 BP, pulse 67, SpO2 97%.  Supine readings repeated: 140/67 BP, pulse 65, SpO2 94%.  Pt unable to raise LUE but following command to raise RUE.  Pt appears to be neglecting LUE.  RN notified who reported to room and reports presentation cognitively is similar to during the day today but pt was able to raise L arm from bed.  Pt oriented to self, place, situation.  This PT paged Dr. Doy Mince who came to see pt. Dr. Dustin Flock also notified at end of session.  Pt requires assist for bed mobility, sit>stand, and side stepping ambulation this session.  SNF remains most appropriate d/c plan at this time.    Follow Up Recommendations  SNF     Equipment Recommendations  Other (comment)(TBD at next venue of care)    Recommendations for Other Services        Precautions / Restrictions Precautions Precautions: Fall;Other (comment) Precaution Comments: Pt participated in log roll again this session Restrictions Weight Bearing Restrictions: No    Mobility  Bed Mobility Overal bed mobility: Needs Assistance Bed Mobility: Rolling;Sidelying to Sit;Sit to Supine Rolling: Mod assist Sidelying to sit: HOB elevated;Mod assist;+2 for physical assistance   Sit to supine: Max assist;+2 for physical assistance   General bed mobility comments: Cues and physical assist for log roll.    Transfers Overall transfer level: Needs assistance Equipment used: Rolling walker (2 wheeled) Transfers: Sit to/from Stand Sit to Stand: Mod assist;+2 physical assistance         General transfer comment: Pt has difficulty followng multimodal cues for sit<>stand but did respond quickly to simple one step command to stand.  He required assist to boost to standing.   Ambulation/Gait Ambulation/Gait assistance: Mod assist;+2 physical assistance Gait Distance (Feet): 5 Feet Assistive device: Rolling walker (2 wheeled) Gait Pattern/deviations: Step-to pattern;Decreased step length - right;Decreased step length - left     General Gait Details: Pt was able to follow simple command of "take a step to your right" with slightly increased time.  He reports fatigue with side stepping and at end of side stepping pt gradually less responsive to cues (see general comments below).     Stairs  Wheelchair Mobility    Modified Rankin (Stroke Patients Only)       Balance Overall balance assessment: Needs assistance;History of Falls Sitting-balance support: Feet supported;Bilateral upper extremity supported Sitting balance-Leahy Scale: Poor Sitting balance - Comments: Pt initially unable to remain steady sitting EOB due to posterior lean and LOB. Pt unable to maintain upright for more than a few seconds with reaching activity this session.  However, once  support taken away pt able to hold himself up to prevent LOB posteriorly.  Postural control: Posterior lean Standing balance support: Bilateral upper extremity supported;During functional activity Standing balance-Leahy Scale: Poor Standing balance comment: Relies on RW and outside physical assist for static and dynamic activity                            Cognition Arousal/Alertness: Awake/alert Behavior During Therapy: Flat affect Overall Cognitive Status: Impaired/Different from baseline                                 General Comments: See general comments below      Exercises      General Comments General comments (skin integrity, edema, etc.): At start of session pt with similar presentation as yesterday with some delayed responses to questions and commands and possible mild neglect to the L (inconsistent presentation).  Pt able to raise LUE at start of session today.  Pt again following commands throughout session today but with some delay and with repeated cues.  At end of session with pt in standing at bedside pt with gradually decreasing response to cues and pt was assisted back to supine.  Vitals taken throughout session.  In supine at start of session: 148/80 BP, pulse 58, SpO2 94%.  Sitting pulse 56, spO2 95%.  Supine at end of session: 157/67 BP, pulse 67, SpO2 97%.  Supine readings repeated: 140/67 BP, pulse 65, SpO2 94%.  Pt unable to raise LUE but following command to raise RUE.  Pt appears to be neglecting LUE.  RN notified who reported to room and reports presentation cognitively is similar to during the day today but pt was able to raise L arm from bed.  Pt oriented to self, place, situation.  This PT paged Dr. Doy Mince who came to see pt. Dr. Dustin Flock also notified at end of session.        Pertinent Vitals/Pain Pain Assessment: Faces Faces Pain Scale: Hurts even more Pain Location: Low back Pain Descriptors / Indicators:  Aching;Guarding;Grimacing Pain Intervention(s): Limited activity within patient's tolerance;Monitored during session;Utilized relaxation techniques    Home Living                      Prior Function            PT Goals (current goals can now be found in the care plan section) Acute Rehab PT Goals Patient Stated Goal: none stated this session PT Goal Formulation: With patient Time For Goal Achievement: 09/27/18 Potential to Achieve Goals: Fair Progress towards PT goals: Progressing toward goals    Frequency    7X/week      PT Plan Current plan remains appropriate    Co-evaluation              AM-PAC PT "6 Clicks" Daily Activity  Outcome Measure  Difficulty turning over in bed (including adjusting bedclothes, sheets and blankets)?: Unable Difficulty moving  from lying on back to sitting on the side of the bed? : Unable Difficulty sitting down on and standing up from a chair with arms (e.g., wheelchair, bedside commode, etc,.)?: Unable Help needed moving to and from a bed to chair (including a wheelchair)?: Total Help needed walking in hospital room?: Total Help needed climbing 3-5 steps with a railing? : Total 6 Click Score: 6    End of Session Equipment Utilized During Treatment: Gait belt Activity Tolerance: Patient limited by fatigue;Treatment limited secondary to medical complications (Comment)(gradually less resposive, Neurologist notified and saw pt ) Patient left: in bed;with call bell/phone within reach;with bed alarm set Nurse Communication: Mobility status;Other (comment)(see general comments above; vitals) PT Visit Diagnosis: Pain;Unsteadiness on feet (R26.81);Other abnormalities of gait and mobility (R26.89);Difficulty in walking, not elsewhere classified (R26.2);Repeated falls (R29.6);Muscle weakness (generalized) (M62.81) Pain - Right/Left: (lower) Pain - part of body: (back)     Time: 2725-3664 PT Time Calculation (min) (ACUTE ONLY): 37  min  Charges:  $Gait Training: 8-22 mins $Therapeutic Activity: 8-22 mins                    Session was performed by student PT, Belva Crome, and directed, overseen, and documented by this PT.  Collie Siad PT, DPT  09/14/2018, 11:12 AM

## 2018-09-14 NOTE — Progress Notes (Signed)
Patient refusing to wear telemetry any longer. Removed himself. Was educated of importance of telemetry monitoring, however patient insisted on not wearing monitoring equipment. Dr. Marcille Blanco and Telemetry monitoring notified of patient's decision.

## 2018-09-14 NOTE — Progress Notes (Signed)
[place] [facility]                                                                                                                                                                                  Patient Demographics   Aaron Harrison, is a 67 y.o. male, DOB - [birth year], UYQ:[id number]  Admit date - 09/11/2018   Admitting Physician Gladstone Lighter, MD  Outpatient Primary MD for the patient is [name], [name], MD   LOS - 2  Subjective: Patient's was moving his right upper extremity earlier now much.  He was seen by PT when they stood him up he became diaphoretic and almost felt like he was going to pass out.  Patient states that he was feeling fine he did not have any symptoms.  Review of Systems:   CONSTITUTIONAL: No documented fever. No fatigue, weakness. No weight gain, no weight loss.  EYES: No blurry or double vision.  ENT: No tinnitus. No postnasal drip. No redness of the oropharynx.  RESPIRATORY: No cough, no wheeze, no hemoptysis. No dyspnea.  CARDIOVASCULAR: No chest pain. No orthopnea. No palpitations. No syncope.  GASTROINTESTINAL: No nausea, no vomiting or diarrhea. No abdominal pain. No melena or hematochezia.  GENITOURINARY: No dysuria or hematuria.  ENDOCRINE: No polyuria or nocturia. No heat or cold intolerance.  HEMATOLOGY: No anemia. No bruising. No bleeding.  INTEGUMENTARY: No rashes. No lesions.  MUSCULOSKELETAL: No arthritis. No swelling. No gout.  NEUROLOGIC: Right upper extremity weakness PSYCHIATRIC: No anxiety. No insomnia. No ADD.    Vitals:   Vitals:   09/13/18 1944 09/14/18 0010 09/14/18 0408 09/14/18 0854  BP: 136/69 (!) 167/86 (!) 179/86 (!) 149/75  Pulse: 72 72 76 70  Resp: 17 18 19    Temp: 97.9 F (36.6 C) 97.9 F (36.6 C) 97.9 F (36.6 C) 98 F (36.7 C)  TempSrc: Oral Oral Oral Oral  SpO2: 95% 95% 96% 97%  Weight:      Height:        Wt Readings from Last 3 Encounters:  09/11/18 106.8 kg  09/12/18 106.6  kg  09/09/18 106.8 kg     Intake/Output Summary (Last 24 hours) at 09/14/2018 1313 Last data filed at 09/14/2018 0645 Gross per 24 hour  Intake -  Output 1200 ml  Net -1200 ml    Physical Exam:   GENERAL: Pleasant-appearing in no apparent distress.  HEAD, EYES, EARS, NOSE AND THROAT: Atraumatic, normocephalic. Extraocular muscles are intact. Pupils equal and reactive to light. Sclerae anicteric. No conjunctival injection. No oro-pharyngeal erythema.  NECK: Supple. There is no jugular venous distention. No bruits, no lymphadenopathy, no thyromegaly.  HEART: Regular rate and  rhythm,. No murmurs, no rubs, no clicks.  LUNGS: Clear to auscultation bilaterally. No rales or rhonchi. No wheezes.  ABDOMEN: Soft, flat, nontender, nondistended. Has good bowel sounds. No hepatosplenomegaly appreciated.  EXTREMITIES: No evidence of any cyanosis, clubbing, or peripheral edema.  +2 pedal and radial pulses bilaterally.  NEUROLOGIC: The patient is alert, awake, and oriented x3 with right upper extremity 4 out of 5 SKIN: Moist and warm with no rashes appreciated.  Psych: Not anxious, depressed LN: No inguinal LN enlargement    Antibiotics   Anti-infectives (From admission, onward)   Start     Dose/Rate Route Frequency Ordered Stop   09/13/18 1600  cephALEXin (KEFLEX) capsule 500 mg     500 mg Oral 3 times daily 09/13/18 1212 09/17/18 1559   09/12/18 1600  cephALEXin (KEFLEX) capsule 500 mg  Status:  Discontinued     500 mg Oral 3 times daily 09/12/18 1522 09/13/18 1212   09/11/18 2000  cephALEXin (KEFLEX) capsule 500 mg  Status:  Discontinued     500 mg Oral Every 6 hours 09/11/18 1957 09/12/18 1528      Medications   Scheduled Meds: . amLODipine  5 mg Oral Daily  . aspirin EC  81 mg Oral Daily  . atorvastatin  40 mg Oral q1800  . carvedilol  12.5 mg Oral BID WC  . cephALEXin  500 mg Oral TID  . furosemide  20 mg Oral Daily  . mouth rinse  15 mL Mouth Rinse BID  . methylPREDNISolone   4 mg Oral BID  . tamsulosin  0.4 mg Oral Daily   Continuous Infusions: PRN Meds:.acetaminophen **OR** acetaminophen, ondansetron **OR** ondansetron (ZOFRAN) IV, traMADol   Data Review:   Micro Results Recent Results (from the past 240 hour(s))  Urine culture     Status: Abnormal   Collection Time: 09/09/18 11:23 PM  Result Value Ref Range Status   Specimen Description   Final    URINE, RANDOM Performed at [facility], [address] [place], [place] [address]    Special Requests   Final    NONE Performed at [facility], [address], [place], [place] [address]    Culture >=100,000 COLONIES/mL KLEBSIELLA PNEUMONIAE (A)  Final   Report Status 09/12/2018 FINAL  Final   Organism ID, Bacteria KLEBSIELLA PNEUMONIAE (A)  Final      Susceptibility   Klebsiella pneumoniae - MIC*    AMPICILLIN >=32 RESISTANT Resistant     CEFAZOLIN <=4 SENSITIVE Sensitive     CEFTRIAXONE <=1 SENSITIVE Sensitive     CIPROFLOXACIN 2 INTERMEDIATE Intermediate     GENTAMICIN <=1 SENSITIVE Sensitive     IMIPENEM 0.5 SENSITIVE Sensitive     NITROFURANTOIN 128 RESISTANT Resistant     TRIMETH/SULFA <=20 SENSITIVE Sensitive     AMPICILLIN/SULBACTAM 4 SENSITIVE Sensitive     PIP/TAZO <=4 SENSITIVE Sensitive     Extended ESBL NEGATIVE Sensitive     * >=100,000 COLONIES/mL KLEBSIELLA PNEUMONIAE    Radiology Reports Dg Chest 2 View  Result Date: 09/06/2018 CLINICAL DATA:  Shortness of breath. EXAM: CHEST - 2 VIEW COMPARISON:  09/05/2018. FINDINGS: Trachea is midline. Heart is enlarged. Mild interstitial prominence and indistinctness, similar to yesterday's exam. No pleural fluid. Flowing anterior osteophytosis in the thoracic spine. IMPRESSION: Mild edema, stable. Electronically Signed   By: Lorin Picket M.D.   On: 09/06/2018 07:43   Dg Chest 2 View  Result Date: 09/05/2018 CLINICAL DATA:  Patient with elevated blood pressure. EXAM:  CHEST - 2 VIEW COMPARISON:  Chest radiograph  07/07/2015 FINDINGS: Stable cardiomegaly. Pulmonary vascular redistribution and bilateral interstitial pulmonary opacities. No pleural effusion or pneumothorax. Thoracic spine degenerative changes. IMPRESSION: Cardiomegaly, pulmonary vascular redistribution and mild interstitial edema. Electronically Signed   By: Lovey Newcomer M.D.   On: 09/05/2018 14:06   Dg Lumbar Spine 2-3 Views  Result Date: 09/10/2018 CLINICAL DATA:  Low back pain after falling a week ago. EXAM: LUMBAR SPINE - 2-3 VIEW COMPARISON:  None. FINDINGS: Two-view exam shows bony demineralization. No evidence for an acute fracture. Loss of disc height noted L4-5 and L5-S1. SI joints unremarkable. IMPRESSION: Negative. Electronically Signed   By: Misty Stanley M.D.   On: 09/10/2018 21:33   Dg Pelvis 1-2 Views  Result Date: 09/09/2018 CLINICAL DATA:  Weakness after fall this morning. EXAM: PELVIS - 1-2 VIEW COMPARISON:  06/22/2015 FINDINGS: Moderate joint space narrowing of both hips. No pelvic fracture or diastasis. Proximal femora appear intact. No joint dislocations. Soft tissues are unremarkable. 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   On: 09/09/2018 20:10   Ct Head Wo Contrast  Result Date: 09/09/2018 CLINICAL DATA:  Patient fell this morning and has increased weakness. EXAM: CT HEAD WITHOUT CONTRAST TECHNIQUE: Contiguous axial images were obtained from the base of the skull through the vertex without intravenous contrast. COMPARISON:  04/29/2011 FINDINGS: Brain: Chronic involutional changes of brain with microvascular ischemic disease. 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 No intra-axial mass, hemorrhage or midline shift. No large vascular territory infarct. Midline fourth ventricle basal cisterns without effacement. Intact brainstem and cerebellum. Remote right caudate lacunar infarct. Vascular: No hyperdense vessel sign.  Atherosclerosis of the carotid siphons. Skull: Intact Sinuses/Orbits: Stigmata of chronic sinusitis with thickened maxillary sinus walls. Mild ethmoid sinus mucosal thickening. Polypoid soft tissue densities within the nasal passages may represent small nasal polyps. Intact orbits. Other: None IMPRESSION: Atrophy with chronic microvascular ischemia. No acute intracranial abnormality. Electronically Signed   By: Ashley Royalty M.D.   On: 09/09/2018 20:07   Ct Lumbar Spine Wo Contrast  Result Date: 09/12/2018 CLINICAL DATA:  Low back pain with difficulty walking after a fall today. Initial encounter. EXAM: CT LUMBAR SPINE WITHOUT CONTRAST TECHNIQUE: Multidetector CT imaging of the lumbar spine was performed without intravenous contrast administration. Multiplanar CT image reconstructions were also generated. COMPARISON:  Lumbar spine radiographs 09/10/2018. Abdominal ultrasound 06/16/2015 FINDINGS: Segmentation: 5 lumbar type vertebrae. Alignment: Mildly exaggerated lumbar lordosis. No listhesis. Vertebrae: L2 superior endplate compression fracture with 15% height loss, acute in appearance with mild paravertebral soft tissue edema. No retropulsion or posterior element fracture. 2.5 cm centrally lucent and peripherally sclerotic lesion in the posterior left ilium, benign in appearance. Paraspinal and other soft tissues: 8.5 cm right upper pole renal cyst (the cyst measures 7 cm on prior ultrasound) abdominal aortic atherosclerosis without aneurysm. Disc levels: Prominent anterior vertebral osteophytes from T11-L3. Mild disc space narrowing at L4-5. Disc bulging at L4-5 without evidence of significant stenosis. Disc bulging at L5-S1 resulting in mild right and moderate left neural foraminal stenosis. No evidence of high-grade spinal stenosis. IMPRESSION: 1. Acute mild L2 compression fracture. 2. Mild lower lumbar disc degeneration. 3.  Aortic Atherosclerosis (ICD10-I70.0). Electronically Signed   By: Logan Bores M.D.    On: 09/12/2018 12:07   Mr Jodene Nam Head Wo Contrast  Result Date: 09/12/2018 CLINICAL DATA:  67 year old male status post brain MRI earlier today demonstrating acute  right hemisphere infarcts in a watershed distribution. EXAM: MRA HEAD WITHOUT CONTRAST MRA NECK WITHOUT CONTRAST TECHNIQUE: Angiographic images of the Circle of Willis were obtained using MRA technique without intravenous contrast. Angiographic images of the neck were obtained using MRA technique without intravenous contrast. Carotid stenosis measurements (when applicable) are obtained utilizing NASCET criteria, using the distal internal carotid diameter as the denominator. COMPARISON:  Brain MRI  1245 hours today. FINDINGS: MRA NECK FINDINGS Time-of-flight MRA images are substantially degraded by motion despite repeated imaging attempts. Antegrade flow is detected in both common carotid and cervical internal carotid arteries. Antegrade flow is detected in both mid cervical vertebral arteries, the left appears dominant. No additional arterial detail is available. MRA HEAD FINDINGS Intracranial MRA images are of better diagnostic quality but still degraded by motion despite repeated imaging attempts. Antegrade flow in codominant appearing distal vertebral arteries with patent vertebrobasilar junction and basilar artery. No evidence of vertebral or basilar stenosis. SCA and PCA origins are patent. Symmetric appearing P2 segment flow signal. PCA branch detail is degraded. Antegrade flow in both ICA siphons to the carotid termini. The MCA and ACA origins are patent. There is symmetric appearing bilateral ACA A2 and distal enhancement. The left MCA bifurcates early. The right MCA bifurcation is patent. There is symmetric appearing MCA M2 enhancement. IMPRESSION: MRA NECK: Limited diagnostic quality due to motion despite repeated imaging attempts. Both cervical carotid and vertebral arteries appear patent. MRA HEAD: Better image quality but circle-of-Willis  branch detail is degraded by motion despite repeated imaging attempts. Negative for large vessel or 2nd order branch occlusion. Electronically Signed   By: Genevie Ann M.D.   On: 09/12/2018 20:22   Mr Jodene Nam Neck Wo Contrast  Result Date: 09/12/2018 CLINICAL DATA:  67 year old male status post brain MRI earlier today demonstrating acute right hemisphere infarcts in a watershed distribution. EXAM: MRA HEAD WITHOUT CONTRAST MRA NECK WITHOUT CONTRAST TECHNIQUE: Angiographic images of the Circle of Willis were obtained using MRA technique without intravenous contrast. Angiographic images of the neck were obtained using MRA technique without intravenous contrast. Carotid stenosis measurements (when applicable) are obtained utilizing NASCET criteria, using the distal internal carotid diameter as the denominator. COMPARISON:  Brain MRI  1245 hours today. FINDINGS: MRA NECK FINDINGS Time-of-flight MRA images are substantially degraded by motion despite repeated imaging attempts. Antegrade flow is detected in both common carotid and cervical internal carotid arteries. Antegrade flow is detected in both mid cervical vertebral arteries, the left appears dominant. No additional arterial detail is available. MRA HEAD FINDINGS Intracranial MRA images are of better diagnostic quality but still degraded by motion despite repeated imaging attempts. Antegrade flow in codominant appearing distal vertebral arteries with patent vertebrobasilar junction and basilar artery. No evidence of vertebral or basilar stenosis. SCA and PCA origins are patent. Symmetric appearing P2 segment flow signal. PCA branch detail is degraded. Antegrade flow in both ICA siphons to the carotid termini. The MCA and ACA origins are patent. There is symmetric appearing bilateral ACA A2 and distal enhancement. The left MCA bifurcates early. The right MCA bifurcation is patent. There is symmetric appearing MCA M2 enhancement. IMPRESSION: MRA NECK: Limited diagnostic  quality due to motion despite repeated imaging attempts. Both cervical carotid and vertebral arteries appear patent. MRA HEAD: Better image quality but circle-of-Willis branch detail is degraded by motion despite repeated imaging attempts. Negative for large vessel or 2nd order branch occlusion. Electronically Signed   By: Genevie Ann M.D.   On: 09/12/2018 20:22  Mr Brain Wo Contrast  Result Date: 09/12/2018 CLINICAL DATA:  Confusion. Left-sided back pain and difficulty walking. EXAM: MRI HEAD WITHOUT CONTRAST TECHNIQUE: Multiplanar, multiecho pulse sequences of the brain and surrounding structures were obtained without intravenous contrast. COMPARISON:  Head CT 09/09/2018 FINDINGS: Multiple sequences are mildly to moderately motion degraded. Brain: Patchy acute to early subacute infarcts are present in the right cerebral hemisphere involving frontal lobe, parietal lobe, posterior temporal lobe, and lateral occipital lobe. Numerous infarcts are oriented in a parasagittal distribution in the centrum semiovale extending to the vertex, and some of the cortical infarcts are along ACA/MCA and MCA/PCA border zones. Punctate acute to early subacute infarcts are also noted in the right external capsule and left caudate head. There is no associated hemorrhage. Small chronic hemorrhages are noted in the left lentiform nucleus and right cerebellum. Elsewhere, scattered T2 hyperintensities in the cerebral white matter bilaterally and pons are nonspecific but compatible with mild chronic small vessel ischemic disease. A remote right caudate infarct is noted with ex vacuo enlargement of the right frontal horn. There is mild global cerebral atrophy. No mass, midline shift, or extra-axial fluid collection is identified. Vascular: Major intracranial vascular flow voids are preserved. Skull and upper cervical spine: Unremarkable bone marrow signal. Sinuses/Orbits: Unremarkable orbits. Small polyps are secretions in the nasal cavity  bilaterally. Focal mucosal thickening or small mucous retention cyst in the left maxillary sinus. Clear mastoid air cells. Other: None. IMPRESSION: 1. 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 2. Mild chronic small vessel ischemic disease. Electronically Signed   By: Logan Bores M.D.   On: 09/12/2018 13:19     CBC Recent Labs  Lab 09/09/18 1907 09/11/18 0401 09/11/18 1904 09/12/18 1142 09/13/18 0537  WBC 2.1* 1.8* 2.1*  --  2.2*  HGB 9.0* 8.8* 9.9* 9.4* 9.3*  HCT 29.2* 27.7* 32.1* 29.6* 29.6*  PLT 94* 84* 94*  --  85*  MCV 99.0 96.9 97.9  --  96.1  MCH 30.5 30.8 30.2  --  30.2  MCHC 30.8 31.8 30.8  --  31.4  RDW 16.7* 16.4* 16.6*  --  16.5*  LYMPHSABS  --   --  0.4*  --   --   MONOABS  --   --  0.1  --   --   EOSABS  --   --  0.2  --   --   BASOSABS  --   --  0.0  --   --     Chemistries  Recent Labs  Lab 09/09/18 1907 09/10/18 2220 09/11/18 0401 09/11/18 1904 09/13/18 0537  NA 140  --  141 139 136  K 3.9  --  3.5 4.1 3.6  CL 108  --  111 106 104  CO2 24  --  24 25 25   GLUCOSE 105*  --  115* 104* 100*  BUN 29*  --  21 21 25*  CREATININE 1.76* 1.47* 1.34* 1.39* 1.32*  CALCIUM 8.6*  --  8.3* 8.9 8.4*   ------------------------------------------------------------------------------------------------------------------ estimated creatinine clearance is 66.1 mL/min (A) (by C-G formula based on SCr of 1.32 mg/dL (H)). ------------------------------------------------------------------------------------------------------------------ Recent Labs    09/12/18 1132  HGBA1C 4.7*   ------------------------------------------------------------------------------------------------------------------ Recent Labs    09/12/18 1552  CHOL 156  HDL 28*  LDLCALC 108*  TRIG 99  CHOLHDL 5.6   ------------------------------------------------------------------------------------------------------------------ Recent Labs    09/12/18 1132  TSH 3.993    ------------------------------------------------------------------------------------------------------------------ No results for input(s): VITAMINB12, FOLATE, FERRITIN, TIBC, IRON, RETICCTPCT in  the last 72 hours.  Coagulation profile No results for input(s): INR, PROTIME in the last 168 hours.  No results for input(s): DDIMER in the last 72 hours.  Cardiac Enzymes Recent Labs  Lab 09/09/18 1937  TROPONINI <0.03   ------------------------------------------------------------------------------------------------------------------ Invalid input(s): POCBNP    Assessment & Plan   Aaron Harrison  is a 67 y.o. male with a known history of chronic systolic congestive heart failure, paroxysmal atrial fibrillation not on anticoagulation due to compliance issues, hypertension and history of lower extremity cellulitis presents to hospital secondary to back pain and confusion.  1.  Acute stroke-MRI of the brain showing acute right cerebral infarcts. -Underlying A. fib, high risk for stroke not on anticoagulation. -Neurology consult appreciated -Echocardiogram with bubble study shows no left-to-right shunt.  Continue aspirin for now.  -Continue statin -We will need to be started on anticoagulation in 1 week -Patient will need rehab however he is refusing rehab  2.  Chronic systolic CHF-well compensated at this time.  Continue oral Lasix at home.  On aspirin and Coreg. -Recently evaluated by cardiology.  Last EF 50 to 55%  3.  Klebsiella UTI-based on cultures from 09/09/2018.  Continue Keflex for total of 5 days  4.  Paroxysmal A. fib-rate controlled.  On Coreg. -We will need anticoagulation  5.  DVT prophylaxis-Lovenox  6.  L2 Mild Compression fracture-after fall. Pain control, PT consult Low-dose prednisone     Code Status Orders  (From admission, onward)         Start     Ordered   09/12/18 1510  Do not attempt resuscitation (DNR)  Continuous    Question Answer  Comment  In the event of cardiac or respiratory ARREST Do not call a "code blue"   In the event of cardiac or respiratory ARREST Do not perform Intubation, CPR, defibrillation or ACLS   In the event of cardiac or respiratory ARREST Use medication by any route, position, wound care, and other measures to relive pain and suffering. May use oxygen, suction and manual treatment of airway obstruction as needed for comfort.   Comments Pt does not want a Permanent Feeding Tube or to be on a ventilator ever again.  He is OK with dialysis, however.      09/12/18 1509        Code Status History    Date Active Date Inactive Code Status Order ID Comments User Context   09/10/2018 2048 09/11/2018 1720 DNR [id number]  Demetrios Loll, MD Inpatient   09/05/2018 2014 09/06/2018 2011 DNR [id number]  Vaughan Basta, MD Inpatient   06/21/2015 1714 07/12/2015 1802 DNR [id number]  Colleen Can, MD Inpatient   06/21/2015 1309 06/21/2015 1714 DNR [id number]  Colleen Can, MD Inpatient   06/15/2015 1719 06/21/2015 1309 Partial Code [id number] Once extubated, he is NOT TO BE RE-INTUBATED Colleen Can, MD Inpatient   06/10/2015 1833 06/15/2015 1712 Full Code [id number]  Demetrios Loll, MD Inpatient   04/07/2015 2236 04/11/2015 1707 Full Code [id number]  Idelle Crouch, MD Inpatient    Advance Directive Documentation     Most Recent Value  Type of Advance Directive  Healthcare Power of Attorney, Living will  Pre-existing out of facility DNR order (yellow form or pink MOST form)  -  "MOST" Form in Place?  -           Consults  neuro  DVT Prophylaxis  Lovenox    Lab Results  Component Value Date   PLT 85 (L)  09/13/2018     Time Spent in minutes   16min Greater than 50% of time spent in care coordination and counseling patient regarding the condition and plan of care.   Dustin Flock M.D on 09/14/2018 at 1:13 PM  Between 7am to 6pm - Pager - [phone]  After 6pm go to [email or web address] -  Proofreader  [employer]   Office  [phone]

## 2018-09-14 NOTE — Plan of Care (Signed)
  Problem: Education: Goal: Knowledge of General Education information will improve Description Including pain rating scale, medication(s)/side effects and non-pharmacologic comfort measures Outcome: Progressing   Problem: Education: Goal: Knowledge of patient specific risk factors addressed and post discharge goals established will improve Outcome: Progressing   Problem: Self-Care: Goal: Ability to communicate needs accurately will improve Outcome: Progressing   Problem: Ischemic Stroke/TIA Tissue Perfusion: Goal: Complications of ischemic stroke/TIA will be minimized Outcome: Progressing   Problem: Education: Goal: Knowledge of patient specific risk factors addressed and post discharge goals established will improve Outcome: Progressing

## 2018-09-14 NOTE — Progress Notes (Addendum)
Subjective: Neuro exam fluctuating.  At times noted to be noncompliant with monitoring device, was taking off telemetry box and SCD when re-directed became agitated. He report that he did not sleep well last night. No report of new stroke or stroke like symptoms. Remains weak on the left with periods of delayed responses to prompts and commands.  Objective: Current vital signs: BP (!) 149/75 (BP Location: Right Arm)   Pulse 70   Temp 98 F (36.7 C) (Oral)   Resp 19   Ht 5\' 8"  (1.727 m)   Wt 106.8 kg   SpO2 97%   BMI 35.80 kg/m  Vital signs in last 24 hours: Temp:  [97.5 F (36.4 C)-98 F (36.7 C)] 98 F (36.7 C) (11/19 0854) Pulse Rate:  [68-76] 70 (11/19 0854) Resp:  [17-19] 19 (11/19 0408) BP: (125-179)/(65-86) 149/75 (11/19 0854) SpO2:  [95 %-99 %] 97 % (11/19 0854)  Intake/Output from previous day: 11/18 0701 - 11/19 0700 In: 240 [P.O.:240] Out: 1200 [Urine:1200] Intake/Output this shift: No intake/output data recorded. Nutritional status:  Diet Order            Diet Heart Room service appropriate? Yes; Fluid consistency: Thin  Diet effective now             Neurologic Exam:  Mental Status: Alert, oriented to person and place, did not know the date today or year, thought content appropriate. Speech fluent without evidence of aphasia. Able to follow 3 step commands with some difficulty. Delayed responses to cues and prompts. Attention span and concentration seemed appropriate  Cranial Nerves: II: Discs flat bilaterally; Visual fields grossly normal, pupils equal, round, reactive to light and accommodation III,IV, VI: ptosis not present, extra-ocular motions intact bilaterally V,VII: smile symmetric, facial light touch sensationintact VIII: hearing normal bilaterally IX,X: gag reflex present XI: bilateral shoulder shrug XII: midline tongue extension Motor: Right :Upper extremity 5/5Without pronator driftLeft: Upper extremity 3/5 without pronator  drift Right:Lower extremity 5/5Left: Lower extremity 4/5 Tone and bulk:normal tone throughout; no atrophy noted Sensory: Pinprick and light touchintact bilaterally   Lab Results: Basic Metabolic Panel: Recent Labs  Lab 09/09/18 1907 09/10/18 2220 09/11/18 0401 09/11/18 1904 09/13/18 0537  NA 140  --  141 139 136  K 3.9  --  3.5 4.1 3.6  CL 108  --  111 106 104  CO2 24  --  24 25 25   GLUCOSE 105*  --  115* 104* 100*  BUN 29*  --  21 21 25*  CREATININE 1.76* 1.47* 1.34* 1.39* 1.32*  CALCIUM 8.6*  --  8.3* 8.9 8.4*    Liver Function Tests: No results for input(s): AST, ALT, ALKPHOS, BILITOT, PROT, ALBUMIN in the last 168 hours. No results for input(s): LIPASE, AMYLASE in the last 168 hours. Recent Labs  Lab 09/09/18 1959  AMMONIA 30    CBC: Recent Labs  Lab 09/09/18 1907 09/11/18 0401 09/11/18 1904 09/12/18 1142 09/13/18 0537  WBC 2.1* 1.8* 2.1*  --  2.2*  NEUTROABS  --   --  1.3*  --   --   HGB 9.0* 8.8* 9.9* 9.4* 9.3*  HCT 29.2* 27.7* 32.1* 29.6* 29.6*  MCV 99.0 96.9 97.9  --  96.1  PLT 94* 84* 94*  --  85*    Cardiac Enzymes: Recent Labs  Lab 09/09/18 1937  TROPONINI <0.03    Lipid Panel: Recent Labs  Lab 09/12/18 1552  CHOL 156  TRIG 99  HDL 28*  CHOLHDL 5.6  VLDL 20  LDLCALC 108*    CBG: No results for input(s): GLUCAP in the last 168 hours.  Microbiology: Results for orders placed or performed during the hospital encounter of 09/09/18  Urine culture     Status: Abnormal   Collection Time: 09/09/18 11:23 PM  Result Value Ref Range Status   Specimen Description   Final    URINE, RANDOM Performed at [facility], [address], [place], [place] [address]    Special Requests   Final    NONE Performed at [facility], [address] [place], [place] [address]    Culture >=100,000 COLONIES/mL KLEBSIELLA PNEUMONIAE (A)  Final   Report Status 09/12/2018 FINAL  Final    Organism ID, Bacteria KLEBSIELLA PNEUMONIAE (A)  Final      Susceptibility   Klebsiella pneumoniae - MIC*    AMPICILLIN >=32 RESISTANT Resistant     CEFAZOLIN <=4 SENSITIVE Sensitive     CEFTRIAXONE <=1 SENSITIVE Sensitive     CIPROFLOXACIN 2 INTERMEDIATE Intermediate     GENTAMICIN <=1 SENSITIVE Sensitive     IMIPENEM 0.5 SENSITIVE Sensitive     NITROFURANTOIN 128 RESISTANT Resistant     TRIMETH/SULFA <=20 SENSITIVE Sensitive     AMPICILLIN/SULBACTAM 4 SENSITIVE Sensitive     PIP/TAZO <=4 SENSITIVE Sensitive     Extended ESBL NEGATIVE Sensitive     * >=100,000 COLONIES/mL KLEBSIELLA PNEUMONIAE    Coagulation Studies: No results for input(s): LABPROT, INR in the last 72 hours.  Imaging: Mr Virgel Paling JQ Contrast  Result Date: 09/12/2018 CLINICAL DATA:  67 year old male status post brain MRI earlier today demonstrating acute right hemisphere infarcts in a watershed distribution. EXAM: MRA HEAD WITHOUT CONTRAST MRA NECK WITHOUT CONTRAST TECHNIQUE: Angiographic images of the Circle of Willis were obtained using MRA technique without intravenous contrast. Angiographic images of the neck were obtained using MRA technique without intravenous contrast. Carotid stenosis measurements (when applicable) are obtained utilizing NASCET criteria, using the distal internal carotid diameter as the denominator. COMPARISON:  Brain MRI  1245 hours today. FINDINGS: MRA NECK FINDINGS Time-of-flight MRA images are substantially degraded by motion despite repeated imaging attempts. Antegrade flow is detected in both common carotid and cervical internal carotid arteries. Antegrade flow is detected in both mid cervical vertebral arteries, the left appears dominant. No additional arterial detail is available. MRA HEAD FINDINGS Intracranial MRA images are of better diagnostic quality but still degraded by motion despite repeated imaging attempts. Antegrade flow in codominant appearing distal vertebral arteries with  patent vertebrobasilar junction and basilar artery. No evidence of vertebral or basilar stenosis. SCA and PCA origins are patent. Symmetric appearing P2 segment flow signal. PCA branch detail is degraded. Antegrade flow in both ICA siphons to the carotid termini. The MCA and ACA origins are patent. There is symmetric appearing bilateral ACA A2 and distal enhancement. The left MCA bifurcates early. The right MCA bifurcation is patent. There is symmetric appearing MCA M2 enhancement. IMPRESSION: MRA NECK: Limited diagnostic quality due to motion despite repeated imaging attempts. Both cervical carotid and vertebral arteries appear patent. MRA HEAD: Better image quality but circle-of-Willis branch detail is degraded by motion despite repeated imaging attempts. Negative for large vessel or 2nd order branch occlusion. Electronically Signed   By: Genevie Ann M.D.   On: 09/12/2018 20:22   Mr Jodene Nam Neck Wo Contrast  Result Date: 09/12/2018 CLINICAL DATA:  67 year old male status post brain MRI earlier today demonstrating acute right hemisphere infarcts in a watershed distribution. EXAM: MRA HEAD WITHOUT  CONTRAST MRA NECK WITHOUT CONTRAST TECHNIQUE: Angiographic images of the Circle of Willis were obtained using MRA technique without intravenous contrast. Angiographic images of the neck were obtained using MRA technique without intravenous contrast. Carotid stenosis measurements (when applicable) are obtained utilizing NASCET criteria, using the distal internal carotid diameter as the denominator. COMPARISON:  Brain MRI  1245 hours today. FINDINGS: MRA NECK FINDINGS Time-of-flight MRA images are substantially degraded by motion despite repeated imaging attempts. Antegrade flow is detected in both common carotid and cervical internal carotid arteries. Antegrade flow is detected in both mid cervical vertebral arteries, the left appears dominant. No additional arterial detail is available. MRA HEAD FINDINGS Intracranial MRA  images are of better diagnostic quality but still degraded by motion despite repeated imaging attempts. Antegrade flow in codominant appearing distal vertebral arteries with patent vertebrobasilar junction and basilar artery. No evidence of vertebral or basilar stenosis. SCA and PCA origins are patent. Symmetric appearing P2 segment flow signal. PCA branch detail is degraded. Antegrade flow in both ICA siphons to the carotid termini. The MCA and ACA origins are patent. There is symmetric appearing bilateral ACA A2 and distal enhancement. The left MCA bifurcates early. The right MCA bifurcation is patent. There is symmetric appearing MCA M2 enhancement. IMPRESSION: MRA NECK: Limited diagnostic quality due to motion despite repeated imaging attempts. Both cervical carotid and vertebral arteries appear patent. MRA HEAD: Better image quality but circle-of-Willis branch detail is degraded by motion despite repeated imaging attempts. Negative for large vessel or 2nd order branch occlusion. Electronically Signed   By: Genevie Ann M.D.   On: 09/12/2018 20:22   Mr Brain Wo Contrast  Result Date: 09/12/2018 CLINICAL DATA:  Confusion. Left-sided back pain and difficulty walking. EXAM: MRI HEAD WITHOUT CONTRAST TECHNIQUE: Multiplanar, multiecho pulse sequences of the brain and surrounding structures were obtained without intravenous contrast. COMPARISON:  Head CT 09/09/2018 FINDINGS: Multiple sequences are mildly to moderately motion degraded. Brain: Patchy acute to early subacute infarcts are present in the right cerebral hemisphere involving frontal lobe, parietal lobe, posterior temporal lobe, and lateral occipital lobe. Numerous infarcts are oriented in a parasagittal distribution in the centrum semiovale extending to the vertex, and some of the cortical infarcts are along ACA/MCA and MCA/PCA border zones. Punctate acute to early subacute infarcts are also noted in the right external capsule and left caudate head. There is  no associated hemorrhage. Small chronic hemorrhages are noted in the left lentiform nucleus and right cerebellum. Elsewhere, scattered T2 hyperintensities in the cerebral white matter bilaterally and pons are nonspecific but compatible with mild chronic small vessel ischemic disease. A remote right caudate infarct is noted with ex vacuo enlargement of the right frontal horn. There is mild global cerebral atrophy. No mass, midline shift, or extra-axial fluid collection is identified. Vascular: Major intracranial vascular flow voids are preserved. Skull and upper cervical spine: Unremarkable bone marrow signal. Sinuses/Orbits: Unremarkable orbits. Small polyps are secretions in the nasal cavity bilaterally. Focal mucosal thickening or small mucous retention cyst in the left maxillary sinus. Clear mastoid air cells. Other: None. IMPRESSION: 1. 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 2. Mild chronic small vessel ischemic disease. Electronically Signed   By: Logan Bores M.D.   On: 09/12/2018 13:19    Medications:  I have reviewed the patient's current medications. Prior to Admission:  Medications Prior to Admission  Medication Sig Dispense Refill Last Dose  . aspirin EC 81 MG tablet Take 81 mg by mouth daily.  09/12/2018 at 0800  . carvedilol (COREG) 12.5 MG tablet Take 1 tablet (12.5 mg total) by mouth 2 (two) times daily with a meal. (Patient not taking: Reported on 09/12/2018) 60 tablet 0 Not Taking at Unknown time  . furosemide (LASIX) 20 MG tablet Take 1 tablet (20 mg total) by mouth daily. (Patient not taking: Reported on 04/02/2016) 30 tablet 2 Not Taking at Unknown time  . oxyCODONE-acetaminophen (PERCOCET) 5-325 MG tablet Take 1 tablet by mouth every 6 (six) hours as needed for severe pain. 20 tablet 0 prn at prn  . tamsulosin (FLOMAX) 0.4 MG CAPS capsule Take 1 capsule (0.4 mg total) by mouth daily. (Patient not taking: Reported on 09/12/2018) 30 capsule 2 Not Taking at  Unknown time   Scheduled: . amLODipine  5 mg Oral Daily  . aspirin EC  81 mg Oral Daily  . atorvastatin  40 mg Oral q1800  . carvedilol  12.5 mg Oral BID WC  . cephALEXin  500 mg Oral TID  . furosemide  20 mg Oral Daily  . mouth rinse  15 mL Mouth Rinse BID  . methylPREDNISolone  4 mg Oral BID  . tamsulosin  0.4 mg Oral Daily   Patient seen and examined.  Clinical course and management discussed.  Necessary edits performed.  I agree with the above.  Assessment and plan of care developed and discussed below.    Assessment: 67 y.o. male presenting with left side weakness causing a fall. Found to have acute right hemispheric infarcts. 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 Exam fluctuating.  Will rule out hypoperfusion due to low BP and bradycardia.  Infarcts primarily in a watershed distribution. Hemoglobin A1c 4.7, LDL 108.  Plan: 1. 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  With start on Eliquis may discontinue ASA.   2. Statin with goal low density lipoprotein (LDL) <70 mg/dl, and lifestyle modification.  3. PT consult, OT consult, Speech consult 4. Check Orthostatics 5. Telemetry monitoring 6. Frequent neuro checks 7. Liberal BP control  This patient was staffed with Dr. Magda Paganini, Doy Mince who personally evaluated patient, reviewed documentation and agreed with assessment and plan of care as above.  Rufina Falco, DNP, FNP-BC Board certified Nurse Practitioner Neurology Department    LOS: 2 days   09/14/2018  12:45 PM  Alexis Goodell, MD Neurology [phone]  09/14/2018  2:51 PM

## 2018-09-15 MED ORDER — ACETAMINOPHEN 325 MG PO TABS: 650.0000 mg | ORAL_TABLET | Freq: Four times a day (QID) | ORAL | Status: DC | PRN

## 2018-09-15 MED ORDER — APIXABAN 5 MG PO TABS: 5.0000 mg | ORAL_TABLET | Freq: Two times a day (BID) | ORAL | 2 refills | Status: DC

## 2018-09-15 MED ORDER — AMLODIPINE BESYLATE 5 MG PO TABS: 5.0000 mg | ORAL_TABLET | Freq: Every day | ORAL | Status: DC

## 2018-09-15 MED ORDER — TRAMADOL HCL 50 MG PO TABS: 50.0000 mg | ORAL_TABLET | Freq: Four times a day (QID) | ORAL | 0 refills | Status: DC | PRN

## 2018-09-15 MED ORDER — ATORVASTATIN CALCIUM 40 MG PO TABS: 40.0000 mg | ORAL_TABLET | Freq: Every day | ORAL | Status: DC

## 2018-09-15 MED ORDER — CEPHALEXIN 500 MG PO CAPS: 500.0000 mg | ORAL_CAPSULE | Freq: Three times a day (TID) | ORAL | 0 refills | Status: AC

## 2018-09-15 NOTE — Care Management Important Message (Signed)
Important Message  Patient Details  Name: Aaron Harrison MRN: [id number] Date of Birth: [birth year]   Medicare Important Message Given:  Yes    [name] 09/15/2018, 11:30 AM

## 2018-09-15 NOTE — Discharge Summary (Signed)
[facility] [place] [facility] [place], 67 y.o., DOB May 01, 1951, MRN [id number]. Admission date: 09/11/2018 Discharge Date 09/15/2018 Primary MD Donnie Coffin, MD Admitting Physician Gladstone Lighter, MD  Admission Diagnosis  Weakness [R53.1] Closed compression fracture of L2 lumbar vertebra, initial encounter [facility]) [S32.020A] Acute cerebrovascular accident (CVA) [facility]) [I63.9]  Discharge Diagnosis   Active Problems: Acute right cerebral infarct Paroxysmal atrial fibrillation Chronic systolic CHF Recent Klebsiella UTI L2 mild compression fracture Chronic systolic CHF  Hospital Course Aaron Harrison a67 y.o.malewith a known history of chronic systolic congestive heart failure, paroxysmal atrial fibrillation not on anticoagulation due to compliance issues, hypertension and history of lower extremity cellulitis presents to hospital secondary to back pain and confusion.  Further evaluation showed acute right cerebral infarct.  Patient also was noted to be in atrial fibrillation.  He underwent MRI MRA carotid Dopplers which confirmed a stroke.  He also had a echo which did not show any type of shunt.  Patient was cleared by neurology to start Eliquis today.  He also had a recent UTI which she is being treated for.   Keflex for 2 more days           Consults  neurology  Significant Tests:  See full reports for all details     Dg Chest 2 View  Result Date: 09/06/2018 CLINICAL DATA:  Shortness of breath. EXAM: CHEST - 2 VIEW COMPARISON:  09/05/2018. FINDINGS: Trachea is midline. Heart is enlarged. Mild interstitial prominence and indistinctness, similar to yesterday's exam. No pleural fluid. Flowing anterior osteophytosis in the thoracic spine. IMPRESSION: Mild edema, stable. Electronically Signed   By: Lorin Picket M.D.   On: 09/06/2018 07:43   Dg Chest 2 View  Result Date: 09/05/2018 CLINICAL DATA:  Patient with elevated blood pressure.  EXAM: CHEST - 2 VIEW COMPARISON:  Chest radiograph 07/07/2015 FINDINGS: Stable cardiomegaly. Pulmonary vascular redistribution and bilateral interstitial pulmonary opacities. No pleural effusion or pneumothorax. Thoracic spine degenerative changes. IMPRESSION: Cardiomegaly, pulmonary vascular redistribution and mild interstitial edema. Electronically Signed   By: Lovey Newcomer M.D.   On: 09/05/2018 14:06   Dg Lumbar Spine 2-3 Views  Result Date: 09/10/2018 CLINICAL DATA:  Low back pain after falling a week ago. EXAM: LUMBAR SPINE - 2-3 VIEW COMPARISON:  None. FINDINGS: Two-view exam shows bony demineralization. No evidence for an acute fracture. Loss of disc height noted L4-5 and L5-S1. SI joints unremarkable. IMPRESSION: Negative. Electronically Signed   By: Misty Stanley M.D.   On: 09/10/2018 21:33   Dg Pelvis 1-2 Views  Result Date: 09/09/2018 CLINICAL DATA:  Weakness after fall this morning. EXAM: PELVIS - 1-2 VIEW COMPARISON:  06/22/2015 FINDINGS: Moderate joint space narrowing of both hips. No pelvic fracture or diastasis. Proximal femora appear intact. No joint dislocations. Soft tissues are unremarkable. 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   On: 09/09/2018 20:10   Ct Head Wo Contrast  Result Date: 09/09/2018 CLINICAL DATA:  Patient fell this morning and has increased weakness. EXAM: CT HEAD WITHOUT CONTRAST TECHNIQUE: Contiguous axial images were obtained from the base of the skull through the vertex without intravenous contrast. COMPARISON:  04/29/2011 FINDINGS: Brain: Chronic involutional changes of brain with microvascular ischemic disease. 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 No intra-axial mass, hemorrhage or midline shift. No large vascular territory infarct. Midline fourth ventricle basal cisterns without effacement. Intact brainstem and cerebellum. Remote right caudate lacunar  infarct. Vascular: No hyperdense vessel sign. Atherosclerosis of the carotid siphons. Skull: Intact Sinuses/Orbits: Stigmata of chronic sinusitis with thickened maxillary sinus walls. Mild ethmoid sinus mucosal thickening. Polypoid soft tissue densities within the nasal passages may represent small nasal polyps. Intact orbits. Other: None IMPRESSION: Atrophy with chronic microvascular ischemia. No acute intracranial abnormality. Electronically Signed   By: Ashley Royalty M.D.   On: 09/09/2018 20:07   Ct Lumbar Spine Wo Contrast  Result Date: 09/12/2018 CLINICAL DATA:  Low back pain with difficulty walking after a fall today. Initial encounter. EXAM: CT LUMBAR SPINE WITHOUT CONTRAST TECHNIQUE: Multidetector CT imaging of the lumbar spine was performed without intravenous contrast administration. Multiplanar CT image reconstructions were also generated. COMPARISON:  Lumbar spine radiographs 09/10/2018. Abdominal ultrasound 06/16/2015 FINDINGS: Segmentation: 5 lumbar type vertebrae. Alignment: Mildly exaggerated lumbar lordosis. No listhesis. Vertebrae: L2 superior endplate compression fracture with 15% height loss, acute in appearance with mild paravertebral soft tissue edema. No retropulsion or posterior element fracture. 2.5 cm centrally lucent and peripherally sclerotic lesion in the posterior left ilium, benign in appearance. Paraspinal and other soft tissues: 8.5 cm right upper pole renal cyst (the cyst measures 7 cm on prior ultrasound) abdominal aortic atherosclerosis without aneurysm. Disc levels: Prominent anterior vertebral osteophytes from T11-L3. Mild disc space narrowing at L4-5. Disc bulging at L4-5 without evidence of significant stenosis. Disc bulging at L5-S1 resulting in mild right and moderate left neural foraminal stenosis. No evidence of high-grade spinal stenosis. IMPRESSION: 1. Acute mild L2 compression fracture. 2. Mild lower lumbar disc degeneration. 3.  Aortic Atherosclerosis (ICD10-I70.0).  Electronically Signed   By: Logan Bores M.D.   On: 09/12/2018 12:07   Mr Jodene Nam Head Wo Contrast  Result Date: 09/12/2018 CLINICAL DATA:  67 year old male status post brain MRI earlier today demonstrating acute right hemisphere infarcts in a watershed distribution. EXAM: MRA HEAD WITHOUT CONTRAST MRA NECK WITHOUT CONTRAST TECHNIQUE: Angiographic images of the Circle of Willis were obtained using MRA technique without intravenous contrast. Angiographic images of the neck were obtained using MRA technique without intravenous contrast. Carotid stenosis measurements (when applicable) are obtained utilizing NASCET criteria, using the distal internal carotid diameter as the denominator. COMPARISON:  Brain MRI  1245 hours today. FINDINGS: MRA NECK FINDINGS Time-of-flight MRA images are substantially degraded by motion despite repeated imaging attempts. Antegrade flow is detected in both common carotid and cervical internal carotid arteries. Antegrade flow is detected in both mid cervical vertebral arteries, the left appears dominant. No additional arterial detail is available. MRA HEAD FINDINGS Intracranial MRA images are of better diagnostic quality but still degraded by motion despite repeated imaging attempts. Antegrade flow in codominant appearing distal vertebral arteries with patent vertebrobasilar junction and basilar artery. No evidence of vertebral or basilar stenosis. SCA and PCA origins are patent. Symmetric appearing P2 segment flow signal. PCA branch detail is degraded. Antegrade flow in both ICA siphons to the carotid termini. The MCA and ACA origins are patent. There is symmetric appearing bilateral ACA A2 and distal enhancement. The left MCA bifurcates early. The right MCA bifurcation is patent. There is symmetric appearing MCA M2 enhancement. IMPRESSION: MRA NECK: Limited diagnostic quality due to motion despite repeated imaging attempts. Both cervical carotid and vertebral arteries appear patent. MRA  HEAD: Better image quality but circle-of-Willis branch detail is degraded by motion despite repeated imaging attempts. Negative for large vessel or 2nd order branch occlusion. Electronically Signed   By: Genevie Ann M.D.   On: 09/12/2018 20:22   Mr  Mra Neck Wo Contrast  Result Date: 09/12/2018 CLINICAL DATA:  67 year old male status post brain MRI earlier today demonstrating acute right hemisphere infarcts in a watershed distribution. EXAM: MRA HEAD WITHOUT CONTRAST MRA NECK WITHOUT CONTRAST TECHNIQUE: Angiographic images of the Circle of Willis were obtained using MRA technique without intravenous contrast. Angiographic images of the neck were obtained using MRA technique without intravenous contrast. Carotid stenosis measurements (when applicable) are obtained utilizing NASCET criteria, using the distal internal carotid diameter as the denominator. COMPARISON:  Brain MRI  1245 hours today. FINDINGS: MRA NECK FINDINGS Time-of-flight MRA images are substantially degraded by motion despite repeated imaging attempts. Antegrade flow is detected in both common carotid and cervical internal carotid arteries. Antegrade flow is detected in both mid cervical vertebral arteries, the left appears dominant. No additional arterial detail is available. MRA HEAD FINDINGS Intracranial MRA images are of better diagnostic quality but still degraded by motion despite repeated imaging attempts. Antegrade flow in codominant appearing distal vertebral arteries with patent vertebrobasilar junction and basilar artery. No evidence of vertebral or basilar stenosis. SCA and PCA origins are patent. Symmetric appearing P2 segment flow signal. PCA branch detail is degraded. Antegrade flow in both ICA siphons to the carotid termini. The MCA and ACA origins are patent. There is symmetric appearing bilateral ACA A2 and distal enhancement. The left MCA bifurcates early. The right MCA bifurcation is patent. There is symmetric appearing MCA M2  enhancement. IMPRESSION: MRA NECK: Limited diagnostic quality due to motion despite repeated imaging attempts. Both cervical carotid and vertebral arteries appear patent. MRA HEAD: Better image quality but circle-of-Willis branch detail is degraded by motion despite repeated imaging attempts. Negative for large vessel or 2nd order branch occlusion. Electronically Signed   By: Genevie Ann M.D.   On: 09/12/2018 20:22   Mr Brain Wo Contrast  Result Date: 09/12/2018 CLINICAL DATA:  Confusion. Left-sided back pain and difficulty walking. EXAM: MRI HEAD WITHOUT CONTRAST TECHNIQUE: Multiplanar, multiecho pulse sequences of the brain and surrounding structures were obtained without intravenous contrast. COMPARISON:  Head CT 09/09/2018 FINDINGS: Multiple sequences are mildly to moderately motion degraded. Brain: Patchy acute to early subacute infarcts are present in the right cerebral hemisphere involving frontal lobe, parietal lobe, posterior temporal lobe, and lateral occipital lobe. Numerous infarcts are oriented in a parasagittal distribution in the centrum semiovale extending to the vertex, and some of the cortical infarcts are along ACA/MCA and MCA/PCA border zones. Punctate acute to early subacute infarcts are also noted in the right external capsule and left caudate head. There is no associated hemorrhage. Small chronic hemorrhages are noted in the left lentiform nucleus and right cerebellum. Elsewhere, scattered T2 hyperintensities in the cerebral white matter bilaterally and pons are nonspecific but compatible with mild chronic small vessel ischemic disease. A remote right caudate infarct is noted with ex vacuo enlargement of the right frontal horn. There is mild global cerebral atrophy. No mass, midline shift, or extra-axial fluid collection is identified. Vascular: Major intracranial vascular flow voids are preserved. Skull and upper cervical spine: Unremarkable bone marrow signal. Sinuses/Orbits: Unremarkable  orbits. Small polyps are secretions in the nasal cavity bilaterally. Focal mucosal thickening or small mucous retention cyst in the left maxillary sinus. Clear mastoid air cells. Other: None. IMPRESSION: 1. 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 2. Mild chronic small vessel ischemic disease. Electronically Signed   By: Logan Bores M.D.   On: 09/12/2018 13:19       Today   Subjective:  Aaron Harrison patient still has weakness of his upper extremity Objective:   Blood pressure (!) 157/70, pulse (!) 59, temperature (!) 97.5 F (36.4 C), temperature source Oral, resp. rate 18, height 5\' 8"  (1.727 m), weight 106.8 kg, SpO2 99 %.  .  Intake/Output Summary (Last 24 hours) at 09/15/2018 0954 Last data filed at 09/14/2018 1828 Gross per 24 hour  Intake 240 ml  Output 400 ml  Net -160 ml    Exam VITAL SIGNS: Blood pressure (!) 157/70, pulse (!) 59, temperature (!) 97.5 F (36.4 C), temperature source Oral, resp. rate 18, height 5\' 8"  (1.727 m), weight 106.8 kg, SpO2 99 %.  GENERAL:  67 y.o.-year-old patient lying in the bed with no acute distress.  EYES: Pupils equal, round, reactive to light and accommodation. No scleral icterus. Extraocular muscles intact.  HEENT: Head atraumatic, normocephalic. Oropharynx and nasopharynx clear.  NECK:  Supple, no jugular venous distention. No thyroid enlargement, no tenderness.  LUNGS: Normal breath sounds bilaterally, no wheezing, rales,rhonchi or crepitation. No use of accessory muscles of respiration.  CARDIOVASCULAR: S1, S2 normal. No murmurs, rubs, or gallops.  ABDOMEN: Soft, nontender, nondistended. Bowel sounds present. No organomegaly or mass.  EXTREMITIES: No pedal edema, cyanosis, or clubbing.  NEUROLOGIC: Cranial nerves II through XII are intact. , right upper extremity 4 out of 5 sensation intact. Gait not checked.  PSYCHIATRIC: The patient is alert and oriented x 3.  SKIN: No obvious rash, lesion, or ulcer.    Data Review     CBC w Diff:  Lab Results  Component Value Date   WBC 2.2 (L) 09/13/2018   HGB 9.3 (L) 09/13/2018   HGB 15.2 07/16/2013   HCT 29.6 (L) 09/13/2018   HCT 43.8 07/16/2013   PLT 85 (L) 09/13/2018   PLT 179 07/16/2013   LYMPHOPCT 21 09/11/2018   BANDSPCT 0 06/15/2015   MONOPCT 5 09/11/2018   EOSPCT 9 09/11/2018   BASOPCT 1 09/11/2018   CMP:  Lab Results  Component Value Date   NA 136 09/13/2018   NA 137 07/16/2013   K 3.6 09/13/2018   K 3.0 (L) 07/16/2013   CL 104 09/13/2018   CL 103 07/16/2013   CO2 25 09/13/2018   CO2 25 07/16/2013   BUN 25 (H) 09/13/2018   BUN 19 (H) 07/16/2013   CREATININE 1.32 (H) 09/13/2018   CREATININE 1.53 (H) 07/16/2013   PROT 7.2 09/05/2018   PROT 7.3 07/16/2013   ALBUMIN 3.8 09/05/2018   ALBUMIN 4.1 07/16/2013   BILITOT 2.3 (H) 09/05/2018   BILITOT 1.8 (H) 07/16/2013   ALKPHOS 62 09/05/2018   ALKPHOS 91 07/16/2013   AST 13 (L) 09/05/2018   AST 22 07/16/2013   ALT 9 09/05/2018   ALT 23 07/16/2013  .  Micro Results Recent Results (from the past 240 hour(s))  Urine culture     Status: Abnormal   Collection Time: 09/09/18 11:23 PM  Result Value Ref Range Status   Specimen Description   Final    URINE, RANDOM Performed at [facility], [address] [place], [place] [address]    Special Requests   Final    NONE Performed at [facility], [address] [place], [place] [address]    Culture >=100,000 COLONIES/mL KLEBSIELLA PNEUMONIAE (A)  Final   Report Status 09/12/2018 FINAL  Final   Organism ID, Bacteria KLEBSIELLA PNEUMONIAE (A)  Final      Susceptibility   Klebsiella pneumoniae - MIC*    AMPICILLIN >=  32 RESISTANT Resistant     CEFAZOLIN <=4 SENSITIVE Sensitive     CEFTRIAXONE <=1 SENSITIVE Sensitive     CIPROFLOXACIN 2 INTERMEDIATE Intermediate     GENTAMICIN <=1 SENSITIVE Sensitive     IMIPENEM 0.5 SENSITIVE Sensitive     NITROFURANTOIN 128 RESISTANT Resistant     TRIMETH/SULFA  <=20 SENSITIVE Sensitive     AMPICILLIN/SULBACTAM 4 SENSITIVE Sensitive     PIP/TAZO <=4 SENSITIVE Sensitive     Extended ESBL NEGATIVE Sensitive     * >=100,000 COLONIES/mL KLEBSIELLA PNEUMONIAE        Code Status Orders  (From admission, onward)         Start     Ordered   09/12/18 1510  Do not attempt resuscitation (DNR)  Continuous    Question Answer Comment  In the event of cardiac or respiratory ARREST Do not call a "code blue"   In the event of cardiac or respiratory ARREST Do not perform Intubation, CPR, defibrillation or ACLS   In the event of cardiac or respiratory ARREST Use medication by any route, position, wound care, and other measures to relive pain and suffering. May use oxygen, suction and manual treatment of airway obstruction as needed for comfort.   Comments Pt does not want a Permanent Feeding Tube or to be on a ventilator ever again.  He is OK with dialysis, however.      09/12/18 1509        Code Status History    Date Active Date Inactive Code Status Order ID Comments User Context   09/10/2018 2048 09/11/2018 1720 DNR [id number]  Demetrios Loll, MD Inpatient   09/05/2018 2014 09/06/2018 2011 DNR [id number]  Vaughan Basta, MD Inpatient   06/21/2015 1714 07/12/2015 1802 DNR [id number]  Colleen Can, MD Inpatient   06/21/2015 1309 06/21/2015 1714 DNR [id number]  Colleen Can, MD Inpatient   06/15/2015 1719 06/21/2015 1309 Partial Code [id number] Once extubated, he is NOT TO BE RE-INTUBATED Colleen Can, MD Inpatient   06/10/2015 1833 06/15/2015 1712 Full Code [id number]  Demetrios Loll, MD Inpatient   04/07/2015 2236 04/11/2015 1707 Full Code [id number]  Idelle Crouch, MD Inpatient    Advance Directive Documentation     Most Recent Value  Type of Advance Directive  Healthcare Power of Attorney, Living will  Pre-existing out of facility DNR order (yellow form or pink MOST form)  -  "MOST" Form in Place?  -           Contact information  for follow-up providers    [facility] Follow up on 09/21/2018.   Specialty:  Cardiology Why:  at 10:40am Contact information: [address] [place]       Donnie Coffin, MD Follow up in 6 day(s).   Specialty:  Family Medicine Contact information: [place] [address] [phone]            Contact information for after-discharge care    [facility] .   Service:  Skilled Nursing Contact information: [place] [phone]                  Discharge Medications   Allergies as of 09/15/2018      Reactions   Penicillins Nausea And Vomiting   Has patient had a PCN reaction causing immediate rash, facial/tongue/throat swelling,  SOB or lightheadedness with hypotension: No Has patient had a PCN reaction causing severe rash involving mucus membranes or skin necrosis: No Has patient had a PCN reaction that required hospitalization: No Has patient had a PCN reaction occurring within the last 10 years: No If all of the above answers are "NO", then may proceed with Cephalosporin use.      Medication List    STOP taking these medications   oxyCODONE-acetaminophen 5-325 MG tablet Commonly known as:  PERCOCET/ROXICET     TAKE these medications   acetaminophen 325 MG tablet Commonly known as:  TYLENOL Take 2 tablets (650 mg total) by mouth every 6 (six) hours as needed for mild pain (or Fever >/= 101).   amLODipine 5 MG tablet Commonly known as:  NORVASC Take 1 tablet (5 mg total) by mouth daily. Start taking on:  09/16/2018   apixaban 5 MG Tabs tablet Commonly known as:  ELIQUIS Take 1 tablet (5 mg total) by mouth 2 (two) times daily.   aspirin EC 81 MG tablet Take 81 mg by mouth daily.   atorvastatin 40 MG tablet Commonly known as:  LIPITOR Take 1  tablet (40 mg total) by mouth daily at 6 PM.   carvedilol 12.5 MG tablet Commonly known as:  COREG Take 1 tablet (12.5 mg total) by mouth 2 (two) times daily with a meal.   cephALEXin 500 MG capsule Commonly known as:  KEFLEX Take 1 capsule (500 mg total) by mouth 3 (three) times daily for 2 days.   furosemide 20 MG tablet Commonly known as:  LASIX Take 1 tablet (20 mg total) by mouth daily.   tamsulosin 0.4 MG Caps capsule Commonly known as:  FLOMAX Take 1 capsule (0.4 mg total) by mouth daily.   traMADol 50 MG tablet Commonly known as:  ULTRAM Take 1 tablet (50 mg total) by mouth every 6 (six) hours as needed for moderate pain.          Total Time in preparing paper work, data evaluation and todays exam - 33 minutes  Dustin Flock M.D on 09/15/2018 at 9:54 AM [place]  [phone]

## 2018-09-15 NOTE — Progress Notes (Signed)
Wallet, keys, book, cell phone, charger, credit and clothes all given back to patient.  Verified with Adele Dan NS, and Russ Halo, NS

## 2018-09-15 NOTE — Progress Notes (Signed)
Called ant gave report to Phineas Semen LPN at [facility] patient going to room *B

## 2018-09-15 NOTE — Clinical Social Work Note (Signed)
Patient would like to go to [facility], they are able to accept patient today.  Patient to be d/c'ed today to [facility] room 8B.  Patient and family agreeable to plans will transport via ems RN to call report [phone].  Evette Cristal, MSW, [facility]

## 2018-09-20 NOTE — Progress Notes (Deleted)
   Patient ID: Aaron Harrison, male    DOB: [birth year], 68 y.o.   MRN: [id number]  HPI  Aaron Harrison is a 67 y/o male with a history of  Echo report from 09/06/18 reviewed and showed an EF of 50-55%.  Admitted 09/11/18 due to back pain and confusion. Diagnosed with right cerebral infarct. Also noted to be in atrial fibrillation. Neurology consult obtained and he was discharged after 4 days. Admitted 09/09/18 due to weakness and back pain s/p fall. Psychiatry consult obtained to discuss mental capacity. Discharged after 2 days. Admitted 09/06/18 due to acute on chronic heart failure. Cardiology consult obtained. Refused diuretics and left AMA the following day.   He presents today for a follow-up visit although hasn't been seen since June 2017. He presents with a chief complaint of  Review of Systems    Physical Exam    Assessment and Plan:  1: Chronic HF with preserved ejection fraction- - NYHA class  2: HTN- - BP - BMP 09/13/18 reviewed and showed sodium 136, potassium 3.6, creatinine 1.32, GFR 55  3: Atrial fibrillation-

## 2018-09-21 ENCOUNTER — Ambulatory Visit: Payer: Medicare Other | Admitting: Family

## 2018-09-25 NOTE — Discharge Summary (Signed)
[facility] [place] NAME: Aaron Harrison    MR#:  [id number]  DATE OF BIRTH:  25-Jun-1951  DATE OF ADMISSION:  09/09/2018 ADMITTING PHYSICIAN: Demetrios Loll, MD  DATE OF DISCHARGE: 09/11/2018  4:38 PM  PRIMARY CARE PHYSICIAN: Donnie Coffin, MD   ADMISSION DIAGNOSIS:  Dehydration [E86.0] Acute cystitis with hematuria [N30.01] Generalized weakness [R53.1] AKI (acute kidney injury) ([place]) [N17.9] Recurrent falls [R29.6]  DISCHARGE DIAGNOSIS:  Principal Problem:   Fall Active Problems:   Acute on chronic systolic (congestive) heart failure (HCC)   Back pain   SECONDARY DIAGNOSIS:   Past Medical History:  Diagnosis Date  . Arrhythmia   . Cellulitis   . Chronic atrial fibrillation    a. not on long term anticoagulation  . Chronic systolic CHF (congestive heart failure) ([place])   . Pneumonia 2011     ADMITTING HISTORY  HISTORY OF PRESENT ILLNESS:  Aaron Harrison  is a 67 y.o. male with a known history of arrhythmia, cellulitis, chronic A. fib not on anticoagulation, chronic systolic CHF EF: 78% - 67%, CKD stage III and pneumonia.  The patient left AMA 4 days ago after admission for acute on chronic systolic CHF.  He has generalized weakness and fell multiple times at home.  He complains of back pain and is not able to get up.  He denies any headache, dizziness, no chest pain or shortness of breath.  Urinalysis show UTI.  He is treated with Rocephin in the ED.  Since patient has UTI, dehydration and cannot get up and has back pain, ED physician request admission.  I discussed with the ED physician, case manager and the social worker.  The plan is observation, pain control and discharge to home with home health tomorrow.   HOSPITAL COURSE:   *Low back pain from recent fall.  No acute fractures found on x-rays.  Worked with physical therapy.  Discharged home with home health services. Pain medications prescription given.  *UTI.  On Rocephin in  the hospital and discharged on oral antibiotics.  *Blood pressure was uncontrolled likely due to pain.  Improved after starting home medications.  Other comorbidities remained stable  Discharged home in stable condition  CONSULTS OBTAINED:  Treatment Team:  Clapacs, Madie Reno, MD  DRUG ALLERGIES:   Allergies  Allergen Reactions  . Penicillins Nausea And Vomiting    Has patient had a PCN reaction causing immediate rash, facial/tongue/throat swelling, SOB or lightheadedness with hypotension: No Has patient had a PCN reaction causing severe rash involving mucus membranes or skin necrosis: No Has patient had a PCN reaction that required hospitalization: No Has patient had a PCN reaction occurring within the last 10 years: No If all of the above answers are "NO", then may proceed with Cephalosporin use.     DISCHARGE MEDICATIONS:   Allergies as of 09/11/2018      Reactions   Penicillins Nausea And Vomiting      Medication List    TAKE these medications   aspirin EC 81 MG tablet Take 81 mg by mouth daily.   carvedilol 12.5 MG tablet Commonly known as:  COREG Take 1 tablet (12.5 mg total) by mouth 2 (two) times daily with a meal.   furosemide 20 MG tablet Commonly known as:  LASIX Take 1 tablet (20 mg total) by mouth daily.   tamsulosin 0.4 MG Caps capsule Commonly known as:  FLOMAX Take 1 capsule (0.4 mg total) by mouth daily.  Today   VITAL SIGNS:  Blood pressure (!) 162/80, pulse 67, temperature 98.4 F (36.9 C), temperature source Oral, resp. rate 16, height 5\' 8"  (1.727 m), weight 106.8 kg, SpO2 100 %.  I/O:  No intake or output data in the 24 hours ending 09/25/18 1355  PHYSICAL EXAMINATION:  Physical Exam  GENERAL:  67 y.o.-year-old patient lying in the bed with no acute distress.  LUNGS: Normal breath sounds bilaterally, no wheezing, rales,rhonchi or crepitation. No use of accessory muscles of respiration.  CARDIOVASCULAR: S1, S2 normal. No  murmurs, rubs, or gallops.  ABDOMEN: Soft, non-tender, non-distended. Bowel sounds present. No organomegaly or mass.  NEUROLOGIC: Moves all 4 extremities. PSYCHIATRIC: The patient is alert and awake SKIN: No obvious rash, lesion, or ulcer.   DATA REVIEW:   CBC No results for input(s): WBC, HGB, HCT, PLT in the last 168 hours.  Chemistries  No results for input(s): NA, K, CL, CO2, GLUCOSE, BUN, CREATININE, CALCIUM, MG, AST, ALT, ALKPHOS, BILITOT in the last 168 hours.  Invalid input(s): GFRCGP  Cardiac Enzymes No results for input(s): TROPONINI in the last 168 hours.  Microbiology Results  Results for orders placed or performed during the hospital encounter of 09/09/18  Urine culture     Status: Abnormal   Collection Time: 09/09/18 11:23 PM  Result Value Ref Range Status   Specimen Description   Final    URINE, RANDOM Performed at [facility], [address] [place], [place] [address]    Special Requests   Final    NONE Performed at [facility], [address] [place], [place] [address]    Culture >=100,000 COLONIES/mL KLEBSIELLA PNEUMONIAE (A)  Final   Report Status 09/12/2018 FINAL  Final   Organism ID, Bacteria KLEBSIELLA PNEUMONIAE (A)  Final      Susceptibility   Klebsiella pneumoniae - MIC*    AMPICILLIN >=32 RESISTANT Resistant     CEFAZOLIN <=4 SENSITIVE Sensitive     CEFTRIAXONE <=1 SENSITIVE Sensitive     CIPROFLOXACIN 2 INTERMEDIATE Intermediate     GENTAMICIN <=1 SENSITIVE Sensitive     IMIPENEM 0.5 SENSITIVE Sensitive     NITROFURANTOIN 128 RESISTANT Resistant     TRIMETH/SULFA <=20 SENSITIVE Sensitive     AMPICILLIN/SULBACTAM 4 SENSITIVE Sensitive     PIP/TAZO <=4 SENSITIVE Sensitive     Extended ESBL NEGATIVE Sensitive     * >=100,000 COLONIES/mL KLEBSIELLA PNEUMONIAE    RADIOLOGY:  No results found.  Follow up with PCP in 1 week.  Management plans discussed with the patient, family and they are in agreement.  CODE  STATUS:  Code Status History    Date Active Date Inactive Code Status Order ID Comments User Context   09/12/2018 1509 09/15/2018 2127 DNR [id number]  Gladstone Lighter, MD ED   09/10/2018 2048 09/11/2018 1720 DNR [id number]  Demetrios Loll, MD Inpatient   09/05/2018 2014 09/06/2018 2011 DNR [id number]  Vaughan Basta, MD Inpatient   06/21/2015 1714 07/12/2015 1802 DNR [id number]  Colleen Can, MD Inpatient   06/21/2015 1309 06/21/2015 1714 DNR [id number]  Colleen Can, MD Inpatient   06/15/2015 1719 06/21/2015 1309 Partial Code [id number] Once extubated, he is NOT TO BE RE-INTUBATED Colleen Can, MD Inpatient   06/10/2015 1833 06/15/2015 1712 Full Code [id number]  Demetrios Loll, MD Inpatient   04/07/2015 2236 04/11/2015 1707 Full Code [id number]  Idelle Crouch, MD Inpatient    Questions for Most Recent Historical Code Status (Order [id number])    Question  Answer Comment   In the event of cardiac or respiratory ARREST Do not call a "code blue"    In the event of cardiac or respiratory ARREST Do not perform Intubation, CPR, defibrillation or ACLS    In the event of cardiac or respiratory ARREST Use medication by any route, position, wound care, and other measures to relive pain and suffering. May use oxygen, suction and manual treatment of airway obstruction as needed for comfort.    Comments Pt does not want a Permanent Feeding Tube or to be on a ventilator ever again.  He is OK with dialysis, however.         Advance Directive Documentation     Most Recent Value  Type of Advance Directive  Healthcare Power of Attorney  Pre-existing out of facility DNR order (yellow form or pink MOST form)  -  "MOST" Form in Place?  -      TOTAL TIME TAKING CARE OF THIS PATIENT ON DAY OF DISCHARGE: more than 30 minutes.   Leia Alf Jaxyn Rout M.D on 09/25/2018 at 1:55 PM  Between 7am to 6pm - Pager - [phone]  After 6pm go to [email or web address] - password [email or web address]  Office  [phone]  CC: Primary care physician; Donnie Coffin, MD  Note: This dictation was prepared with Dragon dictation along with smaller phrase technology. Any transcriptional errors that result from this process are unintentional.

## 2018-10-07 ENCOUNTER — Encounter: Payer: Self-pay | Admitting: Family

## 2018-10-07 ENCOUNTER — Ambulatory Visit: Payer: No Typology Code available for payment source | Attending: Family | Admitting: Family

## 2018-10-07 VITALS — BP 106/56 | HR 67 | Resp 18 | Ht 68.0 in | Wt 190.0 lb

## 2018-10-07 DIAGNOSIS — Z833 Family history of diabetes mellitus: Secondary | ICD-10-CM | POA: Diagnosis not present

## 2018-10-07 DIAGNOSIS — Z823 Family history of stroke: Secondary | ICD-10-CM | POA: Insufficient documentation

## 2018-10-07 DIAGNOSIS — R531 Weakness: Secondary | ICD-10-CM | POA: Insufficient documentation

## 2018-10-07 DIAGNOSIS — I110 Hypertensive heart disease with heart failure: Secondary | ICD-10-CM | POA: Diagnosis not present

## 2018-10-07 DIAGNOSIS — Z8673 Personal history of transient ischemic attack (TIA), and cerebral infarction without residual deficits: Secondary | ICD-10-CM | POA: Diagnosis not present

## 2018-10-07 DIAGNOSIS — Z880 Allergy status to penicillin: Secondary | ICD-10-CM | POA: Diagnosis not present

## 2018-10-07 DIAGNOSIS — I639 Cerebral infarction, unspecified: Secondary | ICD-10-CM

## 2018-10-07 DIAGNOSIS — I5032 Chronic diastolic (congestive) heart failure: Secondary | ICD-10-CM

## 2018-10-07 DIAGNOSIS — Z79899 Other long term (current) drug therapy: Secondary | ICD-10-CM | POA: Insufficient documentation

## 2018-10-07 DIAGNOSIS — I482 Chronic atrial fibrillation, unspecified: Secondary | ICD-10-CM | POA: Diagnosis not present

## 2018-10-07 DIAGNOSIS — I5022 Chronic systolic (congestive) heart failure: Secondary | ICD-10-CM | POA: Insufficient documentation

## 2018-10-07 DIAGNOSIS — Z8249 Family history of ischemic heart disease and other diseases of the circulatory system: Secondary | ICD-10-CM | POA: Diagnosis not present

## 2018-10-07 DIAGNOSIS — I10 Essential (primary) hypertension: Secondary | ICD-10-CM

## 2018-10-07 DIAGNOSIS — I509 Heart failure, unspecified: Secondary | ICD-10-CM | POA: Insufficient documentation

## 2018-10-07 NOTE — Progress Notes (Signed)
[facility] - PHARMACIST COUNSELING NOTE   ASSESSMENT   Adherence assessment   Patient is using n/a as an adherence strategy.   Patient lives in facility and medications are administered there.   Do you ever forget to take your medication? [] Yes (1) [x] No (0)  Do you ever skip doses due to side effects? [] Yes (1) [x] No (0)  Do you have trouble affording your medicines? [] Yes (1) [x] No (0)  Are you ever unable to pick up your medication due to transportation difficulties? [] Yes (1) [x] No (0)  Do you ever stop taking your medications because you don't believe they are helping? [] Yes (1) [x] No (0)  Total score _0______      Guideline-Directed Medical Therapy/Evidence Based Medicine   ACE/ARB/ARNI:    Beta Blocker: Carvedilol   Aldosterone Antagonist:  Diuretic: Furosemide   (Problem 1):    (Problem 2):   (Problem 3):   PLAN   (Plan 1): Discussed importance of medication adherence and need in relation to CHF.   (Plan 2):   (Plan 3):   SUBJECTIVE   HPI:   Past Medical History:  Diagnosis Date  . Arrhythmia   . Cellulitis   . Chronic atrial fibrillation    a. not on long term anticoagulation  . Chronic systolic CHF (congestive heart failure) ([place])   . Pneumonia 2011  . Stroke [facility]) 08/2018      Current Outpatient Medications:  .  acetaminophen (TYLENOL) 325 MG tablet, Take 2 tablets (650 mg total) by mouth every 6 (six) hours as needed for mild pain (or Fever >/= 101)., Disp: , Rfl:  .  amLODipine (NORVASC) 5 MG tablet, Take 1 tablet (5 mg total) by mouth daily., Disp: , Rfl:  .  apixaban (ELIQUIS) 5 MG TABS tablet, Take 1 tablet (5 mg total) by mouth 2 (two) times daily. (Patient not taking: Reported on 10/07/2018), Disp: 60 tablet, Rfl: 2 .  aspirin EC 81 MG tablet, Take 81 mg by mouth daily., Disp: , Rfl:  .  atorvastatin (LIPITOR) 40 MG tablet, Take 1 tablet (40 mg total) by mouth daily at 6 PM., Disp: , Rfl:   .  carvedilol (COREG) 12.5 MG tablet, Take 1 tablet (12.5 mg total) by mouth 2 (two) times daily with a meal., Disp: 60 tablet, Rfl: 0 .  furosemide (LASIX) 20 MG tablet, Take 1 tablet (20 mg total) by mouth daily., Disp: 30 tablet, Rfl: 2 .  tamsulosin (FLOMAX) 0.4 MG CAPS capsule, Take 1 capsule (0.4 mg total) by mouth daily., Disp: 30 capsule, Rfl: 2 .  traMADol (ULTRAM) 50 MG tablet, Take 1 tablet (50 mg total) by mouth every 6 (six) hours as needed for moderate pain., Disp: 30 tablet, Rfl: 0    OBJECTIVE    BMP Latest Ref Rng & Units 09/13/2018 09/11/2018 09/11/2018  Glucose 70 - 99 mg/dL 100(H) 104(H) 115(H)  BUN 8 - 23 mg/dL 25(H) 21 21  Creatinine 0.61 - 1.24 mg/dL 1.32(H) 1.39(H) 1.34(H)  Sodium 135 - 145 mmol/L 136 139 141  Potassium 3.5 - 5.1 mmol/L 3.6 4.1 3.5  Chloride 98 - 111 mmol/L 104 106 111  CO2 22 - 32 mmol/L 25 25 24   Calcium 8.9 - 10.3 mg/dL 8.4(L) 8.9 8.3(L)    Vital signs: HR 67, BP 106/56, weight (kg) 190lbs  PE: Deferred  ECHO: Date 09/06/18, EF 50-55%, notes     DRUGS TO AVOID IN HEART FAILURE  Drug or Class Mechanism  Analgesics .  NSAIDs . COX-2 inhibitors . Glucocorticoids  Sodium and water retention, increased systemic vascular resistance, decreased response to diuretics   Diabetes Medications . Metformin . Thiazolidinediones o Rosiglitazone (Avandia) o Pioglitazone (Actos) . DPP4 Inhibitors o Saxagliptin (Onglyza) o Sitagliptin (Januvia)   Lactic acidosis Possible calcium channel blockade   Unknown  Antiarrhythmics . Class I  o Flecainide o Disopyramide . Class III o Sotalol . Other o Dronedarone  Negative inotrope, proarrhythmic   Proarrhythmic, beta blockade  Negative inotrope  Antihypertensives . Alpha Blockers o Doxazosin . Calcium Channel Blockers o Diltiazem o Verapamil o Nifedipine . Central Alpha Adrenergics o Moxonidine . Peripheral Vasodilators o Minoxidil  Increases renin and aldosterone  Negative  inotrope    Possible sympathetic withdrawal  Unknown  Anti-infective . Itraconazole . Amphotericin B  Negative inotrope Unknown  Hematologic . Anagrelide . Cilostazol   Possible inhibition of PD IV Inhibition of PD III causing arrhythmias  Neurologic/Psychiatric . Stimulants . Anti-Seizure Drugs o Carbamazepine o Pregabalin . Antidepressants o Tricyclics o Citalopram . Parkinsons o Bromocriptine o Pergolide o Pramipexole . Antipsychotics o Clozapine . Antimigraine o Ergotamine o Methysergide . Appetite suppressants . Bipolar o Lithium  Peripheral alpha and beta agonist activity  Negative inotrope and chronotrope Calcium channel blockade  Negative inotrope, proarrhythmic Dose-dependent QT prolongation  Excessive serotonin activity/valvular damage Excessive serotonin activity/valvular damage Unknown  IgE mediated hypersensitivy, calcium channel blockade  Excessive serotonin activity/valvular damage Excessive serotonin activity/valvular damage Valvular damage  Direct myofibrillar degeneration, adrenergic stimulation  Antimalarials . Chloroquine . Hydroxychloroquine Intracellular inhibition of lysosomal enzymes  Urologic Agents . Alpha Blockers o Doxazosin o Prazosin o Tamsulosin o Terazosin  Increased renin and aldosterone  Adapted from Page RL, et al. "Drugs That May Cause or Exacerbate Heart Failure: A Scientific Statement from the [place]." Circulation 2016; 465:K35-W65. DOI: 10.1161/CIR.0000000000000426   COUNSELING POINTS/CLINICAL PEARLS Carvedilol (Goal: weight less than 85 kg is 25 mg BID, weight greater than 85 kg is 50 mg BID)  Patient should avoid activities requiring coordination until drug effects are realized, as drug may cause dizziness.  This drug may cause diarrhea, nausea, vomiting, arthralgia, back pain, myalgia, headache, vision disorder, erectile dysfunction, reduced libido, or fatigue.  Instruct patient to  report signs/symptoms of adverse cardiovascular effects such as hypotension (especially in elderly patients), arrhythmias, syncope, palpitations, angina, or edema.  Drug may mask symptoms of hypoglycemia. Advise diabetic patients to carefully monitor blood sugar levels.  Patient should take drug with food.  Advise patient against sudden discontinuation of drug. Furosemide  Drug causes sun-sensitivity. Advise patient to use sunscreen and avoid tanning beds. Patient should avoid activities requiring coordination until drug effects are realized, as drug may cause dizziness, vertigo, or blurred vision. This drug may cause hyperglycemia, hyperuricemia, constipation, diarrhea, loss of appetite, nausea, vomiting, purpuric disorder, cramps, spasticity, asthenia, headache, paresthesia, or scaling eczema. Instruct patient to report unusual bleeding/bruising or signs/symptoms of hypotension, infection, pancreatitis, or ototoxicity (tinnitus, hearing impairment). Advise patient to report signs/symptoms of a severe skin reactions (flu-like symptoms, spreading red rash, or skin/mucous membrane blistering) or erythema multiforme. Instruct patient to eat high-potassium foods during drug therapy, as directed by healthcare professional.  Patient should not drink alcohol while taking this drug.  MEDICATION ADHERENCES TIPS AND STRATEGIES 1. Taking medication as prescribed improves patient outcomes in heart failure (reduces hospitalizations, improves symptoms, increases survival) 2. Side effects of medications can be managed by decreasing doses, switching agents, stopping drugs, or adding additional therapy. Please let someone in  the Heart Failure Clinic know if you have having bothersome side effects so we can modify your regimen. Do not alter your medication regimen without talking to [place].  3. Medication reminders can help patients remember to take drugs on time. If you are missing or forgetting doses you can try  linking behaviors, using pill boxes, or an electronic reminder like an alarm on your phone or an app. Some people can also get automated phone calls as medication reminders.   Time spent: 10 min  Forrest Moron, Pharm.D. Clinical Pharmacist 10/07/18

## 2018-10-07 NOTE — Patient Instructions (Signed)
Continue weighing daily and call for an overnight weight gain of > 2 pounds or a weekly weight gain of >5 pounds. 

## 2018-10-07 NOTE — Progress Notes (Signed)
Patient ID: EBRAHIM DEREMER, male    DOB: 1950-11-15, 67 y.o.   MRN: [id number]  HPI  Mr Aaron Harrison is a 67 y/o male with a history of HTN, atrial fibrillation, stroke and chronic HF.   Echo report from 09/06/18 reviewed and showed an EF of 50-55%.  Admitted 09/11/18 due to back pain and confusion. Diagnosed with right cerebral infarct. Also noted to be in atrial fibrillation. Neurology consult obtained and he was discharged after 4 days. Admitted 09/09/18 due to weakness and back pain s/p fall. Psychiatry consult obtained to discuss mental capacity. Discharged after 2 days. Admitted 09/06/18 due to acute on chronic heart failure. Cardiology consult obtained. Refused diuretics and left AMA the following day.   He presents today for a follow-up visit although hasn't been seen since June 2017. He presents with a chief complaint of minimal fatigue upon moderate exertion. He describes this as chronic in nature having been present for several years. He has associated left sided weakness (from recent stroke) and easy bruising along with this. He denies any difficulty sleeping, abdominal distention, palpitations, pedal edema, chest pain, shortness of breath, cough, dizziness or weight gain.   Past Medical History:  Diagnosis Date  . Arrhythmia   . Cellulitis   . Chronic atrial fibrillation    a. not on long term anticoagulation  . Chronic systolic CHF (congestive heart failure) ([place])   . Hypertension   . Pneumonia 2011  . Stroke [facility]) 08/2018   Past Surgical History:  Procedure Laterality Date  . PERIPHERAL VASCULAR CATHETERIZATION N/A 06/27/2015   Procedure: Dialysis/Perma Catheter Insertion;  Surgeon: Algernon Huxley, MD;  Location: [facility];  Service: Cardiovascular;  Laterality: N/A;  . PERIPHERAL VASCULAR CATHETERIZATION Right 07/05/2015   Procedure: Dialysis/Perma Catheter Removal;  Surgeon: Algernon Huxley, MD;  Location: [facility];  Service: Cardiovascular;  Laterality: Right;    Family History  Problem Relation Age of Onset  . Diabetes Mother   . Stroke Mother   . Heart failure Father    Social History   Tobacco Use  . Smoking status: Never Smoker  . Smokeless tobacco: Never Used  Substance Use Topics  . Alcohol use: No   Allergies  Allergen Reactions  . Penicillins Nausea And Vomiting    Has patient had a PCN reaction causing immediate rash, facial/tongue/throat swelling, SOB or lightheadedness with hypotension: No Has patient had a PCN reaction causing severe rash involving mucus membranes or skin necrosis: No Has patient had a PCN reaction that required hospitalization: No Has patient had a PCN reaction occurring within the last 10 years: No If all of the above answers are "NO", then may proceed with Cephalosporin use.    Prior to Admission medications   Medication Sig Start Date End Date Taking? Authorizing Provider  acetaminophen (TYLENOL) 325 MG tablet Take 2 tablets (650 mg total) by mouth every 6 (six) hours as needed for mild pain (or Fever >/= 101). 09/15/18  Yes Dustin Flock, MD  amLODipine (NORVASC) 5 MG tablet Take 1 tablet (5 mg total) by mouth daily. 09/16/18  Yes Dustin Flock, MD  aspirin EC 81 MG tablet Take 81 mg by mouth daily.   Yes [provider]  atorvastatin (LIPITOR) 40 MG tablet Take 1 tablet (40 mg total) by mouth daily at 6 PM. 09/15/18  Yes Dustin Flock, MD  carvedilol (COREG) 12.5 MG tablet Take 1 tablet (12.5 mg total) by mouth 2 (two) times daily with a meal.  09/11/18  Yes Sudini, Alveta Heimlich, MD  furosemide (LASIX) 20 MG tablet Take 1 tablet (20 mg total) by mouth daily. 07/12/15  Yes Gladstone Lighter, MD  tamsulosin (FLOMAX) 0.4 MG CAPS capsule Take 1 capsule (0.4 mg total) by mouth daily. 07/12/15  Yes Gladstone Lighter, MD  traMADol (ULTRAM) 50 MG tablet Take 1 tablet (50 mg total) by mouth every 6 (six) hours as needed for moderate pain. 09/15/18  Yes Dustin Flock, MD    Review of Systems   Constitutional: Positive for fatigue. Negative for appetite change.  HENT: Negative for congestion, postnasal drip and sore throat.   Eyes: Negative.   Respiratory: Negative for cough, chest tightness and shortness of breath.   Cardiovascular: Negative for chest pain, palpitations and leg swelling.  Gastrointestinal: Negative for abdominal distention and abdominal pain.  Endocrine: Negative.   Genitourinary: Negative.   Musculoskeletal: Negative for back pain and neck pain.  Skin: Negative.   Allergic/Immunologic: Negative.   Neurological: Positive for weakness (left sided weakness). Negative for dizziness and light-headedness.  Hematological: Negative for adenopathy. Bruises/bleeds easily.  Psychiatric/Behavioral: Negative for dysphoric mood and sleep disturbance (sleeping on 1 pillow). The patient is not nervous/anxious.     Vitals:   10/07/18 1055  BP: (!) 106/56  Pulse: 67  Resp: 18  SpO2: 100%  Weight: 190 lb (86.2 kg)  Height: 5\' 8"  (1.727 m)   Wt Readings from Last 3 Encounters:  10/07/18 190 lb (86.2 kg)  09/11/18 235 lb 7.2 oz (106.8 kg)  09/12/18 235 lb (106.6 kg)   Lab Results  Component Value Date   CREATININE 1.32 (H) 09/13/2018   CREATININE 1.39 (H) 09/11/2018   CREATININE 1.34 (H) 09/11/2018    Physical Exam Vitals signs and nursing note reviewed.  Constitutional:      Appearance: Normal appearance.  HENT:     Head: Normocephalic and atraumatic.  Neck:     Musculoskeletal: Normal range of motion and neck supple.  Cardiovascular:     Rate and Rhythm: Normal rate. Rhythm irregular.  Pulmonary:     Effort: Pulmonary effort is normal.     Breath sounds: Normal breath sounds.  Abdominal:     General: There is no distension.     Palpations: Abdomen is soft.  Musculoskeletal:        General: No swelling or tenderness.  Skin:    General: Skin is warm and dry.  Neurological:     Mental Status: He is alert and oriented to person, place, and time.      Motor: Weakness (left arm) present.  Psychiatric:        Mood and Affect: Mood normal.        Behavior: Behavior normal.    Assessment and Plan:  1: Chronic HF with preserved ejection fraction- - NYHA class II - euvolemic today - being weighed daily; reminded to have them call for an overnight weight gain of >2 pounds or a weekly weight gain of >5 pounds - not adding salt to his food - currently receiving PT/OT on a daily basis at SNF - BNP 09/05/18 was 813.0 - PharmD reconciled medications via [facility] list - states that he's received his flu vaccine for this season  2: HTN- - BP looks good although on the low side - currently seeing PCP at facility - [facility] 09/13/18 reviewed and showed sodium 136, potassium 3.6, creatinine 1.32, GFR 55  3: Stroke- - has left sided weakness - getting daily PT/OT  Facility medication list was  reviewed.  Return in 3 months or sooner for any questions/problems before then.

## 2018-11-03 ENCOUNTER — Inpatient Hospital Stay
Admission: EM | Admit: 2018-11-03 | Discharge: 2018-11-09 | DRG: 375 | Disposition: A | Discharge status: Skilled Nursing Facility | Payer: Medicare HMO | Attending: Internal Medicine | Admitting: Internal Medicine

## 2018-11-03 ENCOUNTER — Ambulatory Visit (INDEPENDENT_AMBULATORY_CARE_PROVIDER_SITE_OTHER): Payer: Medicare HMO | Admitting: Gastroenterology

## 2018-11-03 ENCOUNTER — Encounter: Payer: Self-pay | Admitting: Emergency Medicine

## 2018-11-03 ENCOUNTER — Inpatient Hospital Stay: Payer: Medicare HMO

## 2018-11-03 ENCOUNTER — Encounter: Payer: Self-pay | Admitting: Gastroenterology

## 2018-11-03 ENCOUNTER — Other Ambulatory Visit: Payer: Self-pay

## 2018-11-03 VITALS — BP 108/74 | HR 74 | Resp 16 | Ht 68.0 in | Wt 190.0 lb

## 2018-11-03 DIAGNOSIS — I482 Chronic atrial fibrillation, unspecified: Secondary | ICD-10-CM | POA: Diagnosis present

## 2018-11-03 DIAGNOSIS — D500 Iron deficiency anemia secondary to blood loss (chronic): Secondary | ICD-10-CM

## 2018-11-03 DIAGNOSIS — K922 Gastrointestinal hemorrhage, unspecified: Secondary | ICD-10-CM | POA: Diagnosis present

## 2018-11-03 DIAGNOSIS — Z880 Allergy status to penicillin: Secondary | ICD-10-CM

## 2018-11-03 DIAGNOSIS — E538 Deficiency of other specified B group vitamins: Secondary | ICD-10-CM | POA: Diagnosis present

## 2018-11-03 DIAGNOSIS — I69354 Hemiplegia and hemiparesis following cerebral infarction affecting left non-dominant side: Secondary | ICD-10-CM

## 2018-11-03 DIAGNOSIS — Z7982 Long term (current) use of aspirin: Secondary | ICD-10-CM | POA: Diagnosis not present

## 2018-11-03 DIAGNOSIS — Z66 Do not resuscitate: Secondary | ICD-10-CM | POA: Diagnosis present

## 2018-11-03 DIAGNOSIS — I130 Hypertensive heart and chronic kidney disease with heart failure and stage 1 through stage 4 chronic kidney disease, or unspecified chronic kidney disease: Secondary | ICD-10-CM | POA: Diagnosis present

## 2018-11-03 DIAGNOSIS — Z79899 Other long term (current) drug therapy: Secondary | ICD-10-CM | POA: Diagnosis not present

## 2018-11-03 DIAGNOSIS — B961 Klebsiella pneumoniae [K. pneumoniae] as the cause of diseases classified elsewhere: Secondary | ICD-10-CM | POA: Diagnosis present

## 2018-11-03 DIAGNOSIS — N183 Chronic kidney disease, stage 3 (moderate): Secondary | ICD-10-CM | POA: Diagnosis present

## 2018-11-03 DIAGNOSIS — C20 Malignant neoplasm of rectum: Principal | ICD-10-CM | POA: Diagnosis present

## 2018-11-03 DIAGNOSIS — Z538 Procedure and treatment not carried out for other reasons: Secondary | ICD-10-CM | POA: Diagnosis present

## 2018-11-03 DIAGNOSIS — I5022 Chronic systolic (congestive) heart failure: Secondary | ICD-10-CM | POA: Diagnosis present

## 2018-11-03 DIAGNOSIS — E785 Hyperlipidemia, unspecified: Secondary | ICD-10-CM | POA: Diagnosis present

## 2018-11-03 DIAGNOSIS — D62 Acute posthemorrhagic anemia: Secondary | ICD-10-CM | POA: Diagnosis present

## 2018-11-03 DIAGNOSIS — D696 Thrombocytopenia, unspecified: Secondary | ICD-10-CM

## 2018-11-03 DIAGNOSIS — N390 Urinary tract infection, site not specified: Secondary | ICD-10-CM | POA: Diagnosis present

## 2018-11-03 DIAGNOSIS — D61818 Other pancytopenia: Secondary | ICD-10-CM

## 2018-11-03 DIAGNOSIS — K259 Gastric ulcer, unspecified as acute or chronic, without hemorrhage or perforation: Secondary | ICD-10-CM | POA: Diagnosis present

## 2018-11-03 DIAGNOSIS — K625 Hemorrhage of anus and rectum: Secondary | ICD-10-CM | POA: Diagnosis not present

## 2018-11-03 LAB — IRON AND TIBC
Iron: 54 ug/dL (ref 45–182)
Saturation Ratios: 26 % (ref 17.9–39.5)
TIBC: 209 ug/dL — ABNORMAL LOW (ref 250–450)
UIBC: 155 ug/dL

## 2018-11-03 LAB — CBC WITH DIFFERENTIAL/PLATELET
Abs Immature Granulocytes: 0 10*3/uL (ref 0.00–0.07)
BASOS PCT: 1 %
Basophils Absolute: 0 10*3/uL (ref 0.0–0.1)
EOS ABS: 0.1 10*3/uL (ref 0.0–0.5)
EOS PCT: 8 %
HCT: 24.6 % — ABNORMAL LOW (ref 39.0–52.0)
Hemoglobin: 8.1 g/dL — ABNORMAL LOW (ref 13.0–17.0)
Immature Granulocytes: 0 %
Lymphocytes Relative: 23 %
Lymphs Abs: 0.4 10*3/uL — ABNORMAL LOW (ref 0.7–4.0)
MCH: 31.2 pg (ref 26.0–34.0)
MCHC: 32.9 g/dL (ref 30.0–36.0)
MCV: 94.6 fL (ref 80.0–100.0)
MONO ABS: 0.1 10*3/uL (ref 0.1–1.0)
Monocytes Relative: 6 %
Neutro Abs: 1.1 10*3/uL — ABNORMAL LOW (ref 1.7–7.7)
Neutrophils Relative %: 62 %
Platelets: 75 10*3/uL — ABNORMAL LOW (ref 150–400)
RBC: 2.6 MIL/uL — AB (ref 4.22–5.81)
RDW: 16.2 % — AB (ref 11.5–15.5)
WBC: 1.8 10*3/uL — AB (ref 4.0–10.5)
nRBC: 0 % (ref 0.0–0.2)

## 2018-11-03 LAB — COMPREHENSIVE METABOLIC PANEL
ALT: 8 U/L (ref 0–44)
ANION GAP: 10 (ref 5–15)
AST: 12 U/L — ABNORMAL LOW (ref 15–41)
Albumin: 3.1 g/dL — ABNORMAL LOW (ref 3.5–5.0)
Alkaline Phosphatase: 75 U/L (ref 38–126)
BUN: 25 mg/dL — ABNORMAL HIGH (ref 8–23)
CO2: 20 mmol/L — ABNORMAL LOW (ref 22–32)
CREATININE: 1.62 mg/dL — AB (ref 0.61–1.24)
Calcium: 8.6 mg/dL — ABNORMAL LOW (ref 8.9–10.3)
Chloride: 105 mmol/L (ref 98–111)
GFR, EST AFRICAN AMERICAN: 51 mL/min — AB (ref >60)
GFR, EST NON AFRICAN AMERICAN: 44 mL/min — AB (ref >60)
Glucose, Bld: 112 mg/dL — ABNORMAL HIGH (ref 70–99)
Potassium: 3.7 mmol/L (ref 3.5–5.1)
Sodium: 135 mmol/L (ref 135–145)
Total Bilirubin: 2.6 mg/dL — ABNORMAL HIGH (ref 0.3–1.2)
Total Protein: 6.7 g/dL (ref 6.5–8.1)

## 2018-11-03 LAB — URINALYSIS, COMPLETE (UACMP) WITH MICROSCOPIC
Bilirubin Urine: NEGATIVE
GLUCOSE, UA: NEGATIVE mg/dL
Hgb urine dipstick: NEGATIVE
KETONES UR: NEGATIVE mg/dL
Nitrite: NEGATIVE
PH: 5 (ref 5.0–8.0)
Protein, ur: NEGATIVE mg/dL
Specific Gravity, Urine: 1.013 (ref 1.005–1.030)

## 2018-11-03 LAB — BILIRUBIN, FRACTIONATED(TOT/DIR/INDIR)
Bilirubin, Direct: 0.7 mg/dL — ABNORMAL HIGH (ref 0.0–0.2)
Indirect Bilirubin: 2.1 mg/dL — ABNORMAL HIGH (ref 0.3–0.9)
Total Bilirubin: 2.8 mg/dL — ABNORMAL HIGH (ref 0.3–1.2)

## 2018-11-03 LAB — GASTROINTESTINAL PANEL BY PCR, STOOL (REPLACES STOOL CULTURE)

## 2018-11-03 LAB — C DIFFICILE QUICK SCREEN W PCR REFLEX
C DIFFICILE (CDIFF) INTERP: NOT DETECTED
C Diff antigen: NEGATIVE
C Diff toxin: NEGATIVE

## 2018-11-03 LAB — HEMOGLOBIN AND HEMATOCRIT, BLOOD
HCT: 25.4 % — ABNORMAL LOW (ref 39.0–52.0)
HEMOGLOBIN: 8.4 g/dL — AB (ref 13.0–17.0)

## 2018-11-03 LAB — PROTIME-INR
INR: 1.16
Prothrombin Time: 14.7 seconds (ref 11.4–15.2)

## 2018-11-03 LAB — MRSA PCR SCREENING: MRSA by PCR: NEGATIVE

## 2018-11-03 LAB — TROPONIN I

## 2018-11-03 LAB — FOLATE: Folate: 10.5 ng/mL (ref >5.9)

## 2018-11-03 LAB — VITAMIN B12: Vitamin B-12: 153 pg/mL — ABNORMAL LOW (ref 180–914)

## 2018-11-03 LAB — LIPASE, BLOOD: LIPASE: 19 U/L (ref 11–51)

## 2018-11-03 LAB — FERRITIN: FERRITIN: 54 ng/mL (ref 24–336)

## 2018-11-03 LAB — LACTATE DEHYDROGENASE: LDH: 117 U/L (ref 98–192)

## 2018-11-03 MED ORDER — CARVEDILOL 25 MG PO TABS
12.5000 mg | ORAL_TABLET | Freq: Two times a day (BID) | ORAL | Status: DC
  Administered 2018-11-05 – 2018-11-09 (×9): 12.5 mg via ORAL
  Filled 2018-11-03: qty 1
  Filled 2018-11-03: qty 4
  Filled 2018-11-03 (×3): qty 1
  Filled 2018-11-03: qty 4
  Filled 2018-11-03: qty 1
  Filled 2018-11-03: qty 4
  Filled 2018-11-03: qty 1
  Filled 2018-11-03: qty 4

## 2018-11-03 MED ORDER — TAMSULOSIN HCL 0.4 MG PO CAPS
0.4000 mg | ORAL_CAPSULE | Freq: Every day | ORAL | Status: DC
  Administered 2018-11-04 – 2018-11-09 (×6): 0.4 mg via ORAL
  Filled 2018-11-03 (×6): qty 1

## 2018-11-03 MED ORDER — ATORVASTATIN CALCIUM 20 MG PO TABS
40.0000 mg | ORAL_TABLET | Freq: Every day | ORAL | Status: DC
  Administered 2018-11-04 – 2018-11-08 (×5): 40 mg via ORAL
  Filled 2018-11-03 (×6): qty 2

## 2018-11-03 MED ORDER — ACETAMINOPHEN 650 MG RE SUPP: 650.0000 mg | Freq: Four times a day (QID) | RECTAL | Status: DC | PRN

## 2018-11-03 MED ORDER — SODIUM CHLORIDE 0.9 % IV SOLN
8.0000 mg/h | INTRAVENOUS | Status: DC
  Administered 2018-11-03 – 2018-11-05 (×4): 8 mg/h via INTRAVENOUS
  Filled 2018-11-03 (×4): qty 80

## 2018-11-03 MED ORDER — AMLODIPINE BESYLATE 5 MG PO TABS
5.0000 mg | ORAL_TABLET | Freq: Every day | ORAL | Status: DC
  Administered 2018-11-05 – 2018-11-09 (×5): 5 mg via ORAL
  Filled 2018-11-03 (×5): qty 1

## 2018-11-03 MED ORDER — TRAMADOL HCL 50 MG PO TABS: 50.0000 mg | ORAL_TABLET | Freq: Four times a day (QID) | ORAL | Status: DC | PRN

## 2018-11-03 MED ORDER — POLYETHYLENE GLYCOL 3350 17 G PO PACK: 17.0000 g | PACK | Freq: Every day | ORAL | Status: DC | PRN

## 2018-11-03 MED ORDER — PANTOPRAZOLE SODIUM 40 MG IV SOLR: 40.0000 mg | Freq: Two times a day (BID) | INTRAVENOUS | Status: DC

## 2018-11-03 MED ORDER — ONDANSETRON HCL 4 MG/2ML IJ SOLN: 4.0000 mg | Freq: Four times a day (QID) | INTRAMUSCULAR | Status: DC | PRN

## 2018-11-03 MED ORDER — SODIUM CHLORIDE 0.9 % IV SOLN
80.0000 mg | Freq: Once | INTRAVENOUS | Status: AC
  Administered 2018-11-03: 20:00:00 80 mg via INTRAVENOUS
  Filled 2018-11-03: qty 80

## 2018-11-03 MED ORDER — ACETAMINOPHEN 325 MG PO TABS: 650.0000 mg | ORAL_TABLET | Freq: Four times a day (QID) | ORAL | Status: DC | PRN

## 2018-11-03 MED ORDER — ONDANSETRON HCL 4 MG PO TABS: 4.0000 mg | ORAL_TABLET | Freq: Four times a day (QID) | ORAL | Status: DC | PRN

## 2018-11-03 MED ORDER — ASPIRIN EC 81 MG PO TBEC
81.0000 mg | DELAYED_RELEASE_TABLET | Freq: Every day | ORAL | Status: DC
  Administered 2018-11-05 – 2018-11-09 (×5): 81 mg via ORAL
  Filled 2018-11-03 (×6): qty 1

## 2018-11-03 NOTE — Progress Notes (Signed)
Aaron Darby, MD [address]  [place]  [place], [place] [address]  Main: [phone]  Fax: [phone]    Gastroenterology Consultation  Referring Provider:     Donato Schultz, MD Primary Care Physician:  Donnie Coffin, MD Primary Gastroenterologist:  Dr. Cephas Harrison Reason for Consultation:     Rectal bleeding        HPI:   Aaron Harrison is a 68 y.o. Caucasian male referred by Dr. Donnie Coffin, MD  for consultation & management of rectal bleeding and anemia.  Patient has history of A. 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  Patient reports that he has been noticing episodes of rectal bleeding, describes them as blood mixed with mucus for the last few months.  His hemoglobin at [facility] home on 11/01/2018 was 7.5, it was 9.3 on 09/13/2018.  According to discharge summary from 09/15/2018, he was cleared by neurology to start Eliquis at the time of discharge.  Apparently, he is not on anticoagulation from the list of medications that he brought with his from [facility] home other than aspirin 81 mg only.  Patient lives independently at [facility] home in an apartment, performs ADLs with assistance and undergoing physical therapy daily.  He can walk with walker.  He says, he is single, does not have children and has been living in [facility] home for a long time.  He denies chest pain, shortness of breath, diarrhea, constipation, abdominal pain.  He did notice some dark stools in the past.  He also has chronic thrombocytopenia, dating back to 2016 with no clear etiology.  Ultrasound in 2016 did not reveal cirrhosis of liver, no splenomegaly.  Patient denies abdominal distention, swelling of legs, hematemesis, melena, bleeding from nose or gums or easy bruising or confusion  NSAIDs:  None  Antiplts/Anticoagulants/Anti thrombotics: Aspirin 81 mg  GI Procedures: None  Past Medical History:  Diagnosis Date  . Arrhythmia   . Cellulitis   . Chronic atrial fibrillation    a. not on long term anticoagulation  . Chronic systolic CHF (congestive heart failure) ([place])   . Hypertension   . Pneumonia 2011  . Stroke [facility]) 08/2018    Past Surgical History:  Procedure Laterality Date  . PERIPHERAL VASCULAR CATHETERIZATION N/A 06/27/2015   Procedure: Dialysis/Perma Catheter Insertion;  Surgeon: Algernon Huxley, MD;  Location: [place] [facility] CV LAB;  Service: Cardiovascular;  Laterality: N/A;  . PERIPHERAL VASCULAR CATHETERIZATION Right 07/05/2015   Procedure: Dialysis/Perma Catheter Removal;  Surgeon: Algernon Huxley, MD;  Location: [facility] CV LAB;  Service: Cardiovascular;  Laterality: Right;    Current Outpatient Medications:  .  acetaminophen (TYLENOL) 325 MG tablet, Take 2 tablets (650 mg total) by mouth every 6 (six) hours as needed for mild pain (or Fever >/= 101)., Disp: , Rfl:  .  amLODipine (NORVASC) 5 MG tablet, Take 1 tablet (5 mg total) by mouth daily., Disp: , Rfl:  .  aspirin EC 81 MG tablet, Take 81 mg by mouth daily., Disp: , Rfl:  .  atorvastatin (LIPITOR) 40 MG tablet, Take 1 tablet (40 mg total) by mouth daily at 6 PM., Disp: , Rfl:  .  carvedilol (COREG) 12.5 MG tablet, Take 1 tablet (12.5 mg total) by mouth 2 (two) times daily with a meal., Disp: 60 tablet,  Rfl: 0 .  furosemide (LASIX) 20 MG tablet, Take 1 tablet (20 mg total) by mouth daily., Disp: 30 tablet, Rfl: 2 .  tamsulosin (FLOMAX) 0.4 MG CAPS capsule, Take 1 capsule (0.4 mg total) by mouth daily., Disp: 30 capsule, Rfl: 2 .  traMADol (ULTRAM) 50 MG tablet, Take 1 tablet (50 mg total) by mouth every 6 (six) hours as needed for moderate pain., Disp: 30 tablet, Rfl: 0   Family History  Problem Relation Age of Onset  . Diabetes Mother   . Stroke Mother   . Heart failure Father      Social History    Tobacco Use  . Smoking status: Never Smoker  . Smokeless tobacco: Never Used  Substance Use Topics  . Alcohol use: No  . Drug use: No    Allergies as of 11/03/2018 - Review Complete 11/03/2018  Allergen Reaction Noted  . Penicillins Nausea And Vomiting 04/07/2015    Review of Systems:    All systems reviewed and negative except where noted in HPI.   Physical Exam:  BP 108/74 (BP Location: Left Arm, Patient Position: Sitting, Cuff Size: Large)   Pulse 74   Resp 16   Ht 5\' 8"  (1.727 m)   Wt 190 lb (86.2 kg)   BMI 28.89 kg/m  No LMP for male patient.  General:   Alert,  Well-developed, well-nourished, pleasant and cooperative in NAD, pale appearing Head:  Normocephalic and atraumatic. Eyes:  Sclera clear, no icterus.   Conjunctiva pale Ears:  Normal auditory acuity. Nose:  No deformity, discharge, or lesions. Mouth:  No deformity or lesions,oropharynx pink & moist. Neck:  Supple; no masses or thyromegaly. Lungs:  Respirations even and unlabored.  Clear throughout to auscultation.   No wheezes, crackles, or rhonchi. No acute distress. Heart:  Regular rate and rhythm; no murmurs, clicks, rubs, or gallops. Abdomen:  Normal bowel sounds. Soft, non-tender and non-distended without masses, hepatosplenomegaly or hernias noted.  No guarding or rebound tenderness.   Rectal: Not performed Msk: Decreased movement in left upper and lower extremity Pulses:  Normal pulses noted. Extremities:  No clubbing or edema.  No cyanosis. Neurologic:  Alert and oriented x3; left-sided hemiparesis Skin:  Intact without significant lesions or rashes. No jaundice. Psych:  Alert and cooperative. Normal mood and affect.  Imaging Studies: Ultrasound abdomen in 05/2015 revealed normal liver  Assessment and Plan:   Aaron Harrison is a 68 y.o. Caucasian male with congestive heart failure, A. fib, chronic kidney disease not on anticoagulation, recent ischemic stroke resulting in left-sided hemiparesis  on aspirin 81 mg seen in consultation in my office for rectal bleeding.  He does have chronic thrombocytopenia and mild leukopenia.  Patient reports ongoing episodes of bleeding per rectum and his last hemoglobin was 7.5 on 11/01/2018.  I recommend him to have endoscopic evaluation but given recent low hemoglobin, ongoing rectal bleed with his limited functional capacity, it would be challenging for him to undergo a bowel prep as outpatient.  Therefore, I recommend him for hospitalization today for further work-up and patient is agreeable.   Recheck CBC, iron studies, B12 and folate levels, CMP today He can continue aspirin 81 mg daily   Follow up after discharge from the hospital   Aaron Darby, MD

## 2018-11-03 NOTE — Consult Note (Signed)
[name], MD [address]  [place]  [place], [place] [address]  Main: [phone]  Fax: [phone] Pager: [phone]   Consultation  Referring Provider:     No ref. provider found Primary Care Physician:  Donnie Coffin, MD Primary Gastroenterologist:  Dr. Sherri Sear         Reason for Consultation:     Rectal bleeding and anemia  Date of Admission:  11/03/2018 Date of Consultation:  11/03/2018         HPI:   Aaron Harrison is a 68 y.o. male with history of CHF, A. fib, ischemic stroke with left-sided hematemesis in 08/2018, not on anticoagulation, on aspirin 81 mg admitted with rectal bleeding.  Patient was originally seen by me in the office today with ongoing rectal bleeding and worsening anemia.  Please see my detailed office note from today's visit for further information.  He has been experiencing loose stools mixed with blood for last few weeks. Due to ongoing rectal bleeding and worsening anemia, I recommended him hospital admission for further evaluation.  When I saw patient in the ER, he had 2 episodes of bowel movements, he had loose reddish-brown BMs mixed with blood.  His hemoglobin in the ER was 8.1.  Patient was supposed to be on Eliquis but he has not been taking as outpatient.  According to his visit with the PCP on 10/15/2018, he was not taking Eliquis.   NSAIDs: None  Antiplts/Anticoagulants/Anti thrombotics: Aspirin 81 mg  GI Procedures: None  Past Medical History:  Diagnosis Date  . Arrhythmia   . Cellulitis   . Chronic atrial fibrillation    a. not on long term anticoagulation  . Chronic systolic CHF (congestive heart failure) ([place])   . Hypertension   . Pneumonia 2011  . Stroke [facility]) 08/2018    Past Surgical History:  Procedure Laterality Date  . PERIPHERAL VASCULAR CATHETERIZATION N/A 06/27/2015   Procedure: Dialysis/Perma Catheter Insertion;  Surgeon: Algernon Huxley, MD;  Location: [facility] CV LAB;  Service: Cardiovascular;   Laterality: N/A;  . PERIPHERAL VASCULAR CATHETERIZATION Right 07/05/2015   Procedure: Dialysis/Perma Catheter Removal;  Surgeon: Algernon Huxley, MD;  Location: [place] CV LAB;  Service: Cardiovascular;  Laterality: Right;   No current facility-administered medications for this encounter.   Current Outpatient Medications:  .  acetaminophen (TYLENOL) 325 MG tablet, Take 2 tablets (650 mg total) by mouth every 6 (six) hours as needed for mild pain (or Fever >/= 101)., Disp: , Rfl:  .  amLODipine (NORVASC) 5 MG tablet, Take 1 tablet (5 mg total) by mouth daily., Disp: , Rfl:  .  aspirin EC 81 MG tablet, Take 81 mg by mouth daily., Disp: , Rfl:  .  atorvastatin (LIPITOR) 40 MG tablet, Take 1 tablet (40 mg total) by mouth daily at 6 PM., Disp: , Rfl:  .  carvedilol (COREG) 12.5 MG tablet, Take 1 tablet (12.5 mg total) by mouth 2 (two) times daily with a meal., Disp: 60 tablet, Rfl: 0 .  furosemide (LASIX) 20 MG tablet, Take 1 tablet (20 mg total) by mouth daily., Disp: 30 tablet, Rfl: 2 .  tamsulosin (FLOMAX) 0.4 MG CAPS capsule, Take 1 capsule (0.4 mg total) by mouth daily., Disp: 30 capsule, Rfl: 2 .  traMADol (ULTRAM) 50 MG tablet, Take 1 tablet (50 mg total) by mouth every 6 (six) hours as needed for moderate pain., Disp: 30 tablet, Rfl: 0   Family History  Problem Relation Age of Onset  . Diabetes Mother   . Stroke Mother   . Heart failure Father      Social History   Tobacco Use  . Smoking status: Never Smoker  . Smokeless tobacco: Never Used  Substance Use Topics  . Alcohol use: No  . Drug use: No    Allergies as of 11/03/2018 - Review Complete 11/03/2018  Allergen Reaction Noted  . Penicillins Nausea And Vomiting 04/07/2015    Review of Systems:    All systems reviewed and negative except where noted in HPI.   Physical Exam:  Vital signs in last 24 hours: Temp:  [98.1 F (36.7 C)] 98.1 F (36.7 C) (01/08 1055) Pulse Rate:  [70-74] 70 (01/08 1630) Resp:  [12-18] 15  (01/08 1630) BP: (108-150)/(56-74) 131/64 (01/08 1630) SpO2:  [97 %-98 %] 98 % (01/08 1630) Weight:  [86.2 kg] 86.2 kg (01/08 0949)   General:   Pleasant, cooperative in NAD Head:  Normocephalic and atraumatic. Eyes:   No icterus.   Conjunctiva pink. PERRLA. Ears:  Normal auditory acuity. Neck:  Supple; no masses or thyroidomegaly Lungs: Respirations even and unlabored. Lungs clear to auscultation bilaterally.   No wheezes, crackles, or rhonchi.  Heart:  Regular rate and rhythm;  Without murmur, clicks, rubs or gallops Abdomen:  Soft, obese, distended, nontender. Normal bowel sounds. No appreciable masses or hepatomegaly.  No rebound or guarding.  Rectal:  Not performed. Msk: Left-sided weakness Extremities:  Without edema, cyanosis or clubbing. Neurologic:  Alert and oriented x3;  grossly normal neurologically. Skin:  Intact without significant lesions or rashes. Psych:  Alert and cooperative. Normal affect.  LAB RESULTS: CBC Latest Ref Rng & Units 11/03/2018 09/13/2018 09/12/2018  WBC 4.0 - 10.5 K/uL 1.8(L) 2.2(L) -  Hemoglobin 13.0 - 17.0 g/dL 8.1(L) 9.3(L) 9.4(L)  Hematocrit 39.0 - 52.0 % 24.6(L) 29.6(L) 29.6(L)  Platelets 150 - 400 K/uL 75(L) 85(L) -    BMET BMP Latest Ref Rng & Units 11/03/2018 09/13/2018 09/11/2018  Glucose 70 - 99 mg/dL 112(H) 100(H) 104(H)  BUN 8 - 23 mg/dL 25(H) 25(H) 21  Creatinine 0.61 - 1.24 mg/dL 1.62(H) 1.32(H) 1.39(H)  Sodium 135 - 145 mmol/L 135 136 139  Potassium 3.5 - 5.1 mmol/L 3.7 3.6 4.1  Chloride 98 - 111 mmol/L 105 104 106  CO2 22 - 32 mmol/L 20(L) 25 25  Calcium 8.9 - 10.3 mg/dL 8.6(L) 8.4(L) 8.9    LFT Hepatic Function Latest Ref Rng & Units 11/03/2018 09/05/2018 07/10/2015  Total Protein 6.5 - 8.1 g/dL 6.7 7.2 -  Albumin 3.5 - 5.0 g/dL 3.1(L) 3.8 3.2(L)  AST 15 - 41 U/L 12(L) 13(L) -  ALT 0 - 44 U/L 8 9 -  Alk Phosphatase 38 - 126 U/L 75 62 -  Total Bilirubin 0.3 - 1.2 mg/dL 2.6(H) 2.3(H) -  Bilirubin, Direct 0.0 - 0.2 mg/dL -  0.5(H) -     STUDIES: No results found.    Impression / Plan:   Aaron Harrison is a 68 y.o. Caucasian male with CHF, EF of 50 to 55%, A. fib, CKD, ischemic stroke with left-sided hemiparesis in 08/2018, not on anticoagulation, on aspirin 81 mg admitted from GI clinic with several weeks of rectal bleeding and worsening anemia.  He does have chronic, thrombocytopenia and leukopenia.  He had witnessed episodes of hematochezia with diarrhea in the ER today.  Hematochezia: Most likely lower GI bleed.  Patient never had a colonoscopy, need to rule out malignancy, other  differentials include ischemic colitis or inflammatory bowel disease or bleeding from AVMs Since he is having loose stools, recommend stool studies to rule out infectious etiology Recommend colonoscopy for further evaluation after ruling out infection Recommend neurology consult for clearance prior to colonoscopy given recent stroke in less than 60 days Can continue aspirin 81 mg daily Okay with full liquid diet Monitor CBC daily and keep hemoglobin greater than 8  Leukopenia and thrombocytopenia: Unclear etiology No known history of chronic liver disease or cirrhosis or splenomegaly Recommend ultrasound abdomen with Dopplers Recommend hematology evaluation  Thank you for involving me in the care of this patient.  Will follow along with you    LOS: 0 days   Sherri Sear, MD  11/03/2018, 5:28 PM   Note: This dictation was prepared with Dragon dictation along with smaller phrase technology. Any transcriptional errors that result from this process are unintentional.

## 2018-11-03 NOTE — H&P (View-Only) (Signed)
[name], MD [address]  [place]  [place], [place] [address]  Main: [phone]  Fax: [phone]    Gastroenterology Consultation  Referring Provider:     Donato Schultz, MD Primary Care Physician:  Donnie Coffin, MD Primary Gastroenterologist:  Dr. [name] Reason for Consultation:     Rectal bleeding        HPI:   Aaron Harrison is a 68 y.o. Caucasian male referred by Dr. Donnie Coffin, MD  for consultation & management of rectal bleeding and anemia.  Patient has history of A. 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  Patient reports that he has been noticing episodes of rectal bleeding, describes them as blood mixed with mucus for the last few months.  His hemoglobin at [facility] home on 11/01/2018 was 7.5, it was 9.3 on 09/13/2018.  According to discharge summary from 09/15/2018, he was cleared by neurology to start Eliquis at the time of discharge.  Apparently, he is not on anticoagulation from the list of medications that he brought with his from [facility] home other than aspirin 81 mg only.  Patient lives independently at [facility] home in an apartment, performs ADLs with assistance and undergoing physical therapy daily.  He can walk with walker.  He says, he is single, does not have children and has been living in [facility] home for a long time.  He denies chest pain, shortness of breath, diarrhea, constipation, abdominal pain.  He did notice some dark stools in the past.  He also has chronic thrombocytopenia, dating back to 2016 with no clear etiology.  Ultrasound in 2016 did not reveal cirrhosis of liver, no splenomegaly.  Patient denies abdominal distention, swelling of legs, hematemesis, melena, bleeding from nose or gums or easy bruising or confusion  NSAIDs:  None  Antiplts/Anticoagulants/Anti thrombotics: Aspirin 81 mg  GI Procedures: None  Past Medical History:  Diagnosis Date  . Arrhythmia   . Cellulitis   . Chronic atrial fibrillation    a. not on long term anticoagulation  . Chronic systolic CHF (congestive heart failure) ([place])   . Hypertension   . Pneumonia 2011  . Stroke [facility]) 08/2018    Past Surgical History:  Procedure Laterality Date  . PERIPHERAL VASCULAR CATHETERIZATION N/A 06/27/2015   Procedure: Dialysis/Perma Catheter Insertion;  Surgeon: Algernon Huxley, MD;  Location: [place] [facility] CV LAB;  Service: Cardiovascular;  Laterality: N/A;  . PERIPHERAL VASCULAR CATHETERIZATION Right 07/05/2015   Procedure: Dialysis/Perma Catheter Removal;  Surgeon: Algernon Huxley, MD;  Location: [facility] CV LAB;  Service: Cardiovascular;  Laterality: Right;    Current Outpatient Medications:  .  acetaminophen (TYLENOL) 325 MG tablet, Take 2 tablets (650 mg total) by mouth every 6 (six) hours as needed for mild pain (or Fever >/= 101)., Disp: , Rfl:  .  amLODipine (NORVASC) 5 MG tablet, Take 1 tablet (5 mg total) by mouth daily., Disp: , Rfl:  .  aspirin EC 81 MG tablet, Take 81 mg by mouth daily., Disp: , Rfl:  .  atorvastatin (LIPITOR) 40 MG tablet, Take 1 tablet (40 mg total) by mouth daily at 6 PM., Disp: , Rfl:  .  carvedilol (COREG) 12.5 MG tablet, Take 1 tablet (12.5 mg total) by mouth 2 (two) times daily with a meal., Disp: 60 tablet,  Rfl: 0 .  furosemide (LASIX) 20 MG tablet, Take 1 tablet (20 mg total) by mouth daily., Disp: 30 tablet, Rfl: 2 .  tamsulosin (FLOMAX) 0.4 MG CAPS capsule, Take 1 capsule (0.4 mg total) by mouth daily., Disp: 30 capsule, Rfl: 2 .  traMADol (ULTRAM) 50 MG tablet, Take 1 tablet (50 mg total) by mouth every 6 (six) hours as needed for moderate pain., Disp: 30 tablet, Rfl: 0   Family History  Problem Relation Age of Onset  . Diabetes Mother   . Stroke Mother   . Heart failure Father      Social History    Tobacco Use  . Smoking status: Never Smoker  . Smokeless tobacco: Never Used  Substance Use Topics  . Alcohol use: No  . Drug use: No    Allergies as of 11/03/2018 - Review Complete 11/03/2018  Allergen Reaction Noted  . Penicillins Nausea And Vomiting 04/07/2015    Review of Systems:    All systems reviewed and negative except where noted in HPI.   Physical Exam:  BP 108/74 (BP Location: Left Arm, Patient Position: Sitting, Cuff Size: Large)   Pulse 74   Resp 16   Ht 5\' 8"  (1.727 m)   Wt 190 lb (86.2 kg)   BMI 28.89 kg/m  No LMP for male patient.  General:   Alert,  Well-developed, well-nourished, pleasant and cooperative in NAD, pale appearing Head:  Normocephalic and atraumatic. Eyes:  Sclera clear, no icterus.   Conjunctiva pale Ears:  Normal auditory acuity. Nose:  No deformity, discharge, or lesions. Mouth:  No deformity or lesions,oropharynx pink & moist. Neck:  Supple; no masses or thyromegaly. Lungs:  Respirations even and unlabored.  Clear throughout to auscultation.   No wheezes, crackles, or rhonchi. No acute distress. Heart:  Regular rate and rhythm; no murmurs, clicks, rubs, or gallops. Abdomen:  Normal bowel sounds. Soft, non-tender and non-distended without masses, hepatosplenomegaly or hernias noted.  No guarding or rebound tenderness.   Rectal: Not performed Msk: Decreased movement in left upper and lower extremity Pulses:  Normal pulses noted. Extremities:  No clubbing or edema.  No cyanosis. Neurologic:  Alert and oriented x3; left-sided hemiparesis Skin:  Intact without significant lesions or rashes. No jaundice. Psych:  Alert and cooperative. Normal mood and affect.  Imaging Studies: Ultrasound abdomen in 05/2015 revealed normal liver  Assessment and Plan:   Aaron Harrison is a 68 y.o. Caucasian male with congestive heart failure, A. fib, chronic kidney disease not on anticoagulation, recent ischemic stroke resulting in left-sided hemiparesis  on aspirin 81 mg seen in consultation in my office for rectal bleeding.  He does have chronic thrombocytopenia and mild leukopenia.  Patient reports ongoing episodes of bleeding per rectum and his last hemoglobin was 7.5 on 11/01/2018.  I recommend him to have endoscopic evaluation but given recent low hemoglobin, ongoing rectal bleed with his limited functional capacity, it would be challenging for him to undergo a bowel prep as outpatient.  Therefore, I recommend him for hospitalization today for further work-up and patient is agreeable.   Recheck CBC, iron studies, B12 and folate levels, CMP today He can continue aspirin 81 mg daily   Follow up after discharge from the hospital   [name], MD

## 2018-11-03 NOTE — Progress Notes (Signed)
Family Meeting Note  Advance Directive:yes  Today a meeting took place with the Patient.  Patient is able to participate.  The following clinical team members were present during this meeting:MD  The following were discussed:Patient's diagnosis: GI bleed, Patient's progosis: Unable to determine and Goals for treatment: DNR   Patient states that he has had a lot of medical issues recently, including his recent stroke.  He is currently residing at a skilled nursing facility for rehab.  He has thought a lot about [facility] in the past, and would like to be DNR.  Additional follow-up to be provided: prn  Time spent during discussion:20 minutes  Evette Doffing, MD

## 2018-11-03 NOTE — H&P (Addendum)
[place] [facility] [place] NAME: Dakarai Mcglocklin    MR#:  [id number]  DATE OF BIRTH:  [birth year]  DATE OF ADMISSION:  11/03/2018  PRIMARY CARE PHYSICIAN: Donnie Coffin, MD   REQUESTING/REFERRING PHYSICIAN: Dr. Jimmye Norman  CHIEF COMPLAINT:   Chief Complaint  Patient presents with  . GI Bleeding    HISTORY OF PRESENT ILLNESS:  Terance Pomplun  is a 68 y.o. male with a known history of chronic atrial fibrillation, chronic systolic heart failure, hypertension, recent history of stroke 09/12/18 who presented to the ED from [facility] with bright red blood per rectum.  He states that for the last couple of days, his stool has been "purple, red, or pink" in color.  He takes a baby aspirin at home.  He denies any abdominal pain, chest pain, shortness of breath, palpitations, hematemesis.  He has never had a colonoscopy or endoscopy.  In the ED, vitals were unremarkable.  Labs are significant for creatinine 1.62, hemoglobin 8.1, total bili 2.6.  Hospitalists were called for admission.  PAST MEDICAL HISTORY:   Past Medical History:  Diagnosis Date  . Arrhythmia   . Cellulitis   . Chronic atrial fibrillation    a. not on long term anticoagulation  . Chronic systolic CHF (congestive heart failure) ([place])   . Hypertension   . Pneumonia 2011  . Stroke [facility]) 08/2018    PAST SURGICAL HISTORY:   Past Surgical History:  Procedure Laterality Date  . PERIPHERAL VASCULAR CATHETERIZATION N/A 06/27/2015   Procedure: Dialysis/Perma Catheter Insertion;  Surgeon: Algernon Huxley, MD;  Location: [place] [facility];  Service: Cardiovascular;  Laterality: N/A;  . PERIPHERAL VASCULAR CATHETERIZATION Right 07/05/2015   Procedure: Dialysis/Perma Catheter Removal;  Surgeon: Algernon Huxley, MD;  Location: [place] [facility];  Service: Cardiovascular;  Laterality: Right;    SOCIAL HISTORY:   Social History   Tobacco Use  . Smoking status: Never Smoker  . Smokeless tobacco:  Never Used  Substance Use Topics  . Alcohol use: No    FAMILY HISTORY:   Family History  Problem Relation Age of Onset  . Diabetes Mother   . Stroke Mother   . Heart failure Father     DRUG ALLERGIES:   Allergies  Allergen Reactions  . Penicillins Nausea And Vomiting    Has patient had a PCN reaction causing immediate rash, facial/tongue/throat swelling, SOB or lightheadedness with hypotension: No Has patient had a PCN reaction causing severe rash involving mucus membranes or skin necrosis: No Has patient had a PCN reaction that required hospitalization: No Has patient had a PCN reaction occurring within the last 10 years: No If all of the above answers are "NO", then may proceed with Cephalosporin use.     REVIEW OF SYSTEMS:   Review of Systems  Constitutional: Negative for chills and fever.  HENT: Negative for congestion and sore throat.   Eyes: Negative for blurred vision and double vision.  Respiratory: Negative for cough and shortness of breath.   Cardiovascular: Negative for chest pain, palpitations and leg swelling.  Gastrointestinal: Positive for blood in stool. Negative for abdominal pain, melena, nausea and vomiting.  Genitourinary: Negative for dysuria and urgency.  Musculoskeletal: Negative for back pain and neck pain.  Neurological: Negative for dizziness and headaches.  Psychiatric/Behavioral: Negative for depression. The patient is not nervous/anxious.     MEDICATIONS AT HOME:   Prior to Admission medications   Medication Sig Start Date End  Date Taking? Authorizing Provider  acetaminophen (TYLENOL) 325 MG tablet Take 2 tablets (650 mg total) by mouth every 6 (six) hours as needed for mild pain (or Fever >/= 101). 09/15/18  Yes Dustin Flock, MD  amLODipine (NORVASC) 5 MG tablet Take 1 tablet (5 mg total) by mouth daily. 09/16/18  Yes Dustin Flock, MD  aspirin EC 81 MG tablet Take 81 mg by mouth daily.   Yes [provider]  atorvastatin  (LIPITOR) 40 MG tablet Take 1 tablet (40 mg total) by mouth daily at 6 PM. 09/15/18  Yes Dustin Flock, MD  carvedilol (COREG) 12.5 MG tablet Take 1 tablet (12.5 mg total) by mouth 2 (two) times daily with a meal. 09/11/18  Yes Sudini, Alveta Heimlich, MD  furosemide (LASIX) 20 MG tablet Take 1 tablet (20 mg total) by mouth daily. 07/12/15  Yes Gladstone Lighter, MD  tamsulosin (FLOMAX) 0.4 MG CAPS capsule Take 1 capsule (0.4 mg total) by mouth daily. 07/12/15  Yes Gladstone Lighter, MD  traMADol (ULTRAM) 50 MG tablet Take 1 tablet (50 mg total) by mouth every 6 (six) hours as needed for moderate pain. 09/15/18  Yes Dustin Flock, MD      VITAL SIGNS:  Blood pressure (!) 135/56, pulse 73, temperature 98.1 F (36.7 C), temperature source Oral, resp. rate 13, SpO2 97 %.  PHYSICAL EXAMINATION:  Physical Exam  GENERAL:  68 y.o.-year-old patient lying in the bed with no acute distress.  EYES: Pupils equal, round, reactive to light and accommodation. No scleral icterus. Extraocular muscles intact.  HEENT: Head atraumatic, normocephalic. Oropharynx and nasopharynx clear.  NECK:  Supple, no jugular venous distention. No thyroid enlargement, no tenderness.  LUNGS: Normal breath sounds bilaterally, no wheezing, rales,rhonchi or crepitation. No use of accessory muscles of respiration.  CARDIOVASCULAR: RRR, S1, S2 normal. No murmurs, rubs, or gallops.  ABDOMEN: Soft, nontender, nondistended. Bowel sounds present. No organomegaly or mass. + Umbilical hernia present.  Hernia is reducible and nontender to palpation. EXTREMITIES: No pedal edema, cyanosis, or clubbing.  NEUROLOGIC: Cranial nerves II through XII are intact. Muscle strength 5/5 in all extremities. Sensation intact. Gait not checked.  PSYCHIATRIC: The patient is alert and oriented x 3.  SKIN: No obvious rash, lesion, or ulcer. + Chronic venous stasis changes in the lower extremities bilaterally.  LABORATORY PANEL:   CBC Recent Labs  Lab  11/03/18 1103  WBC 1.8*  HGB 8.1*  HCT 24.6*  PLT 75*   ------------------------------------------------------------------------------------------------------------------  Chemistries  Recent Labs  Lab 11/03/18 1103  NA 135  K 3.7  CL 105  CO2 20*  GLUCOSE 112*  BUN 25*  CREATININE 1.62*  CALCIUM 8.6*  AST 12*  ALT 8  ALKPHOS 75  BILITOT 2.6*   ------------------------------------------------------------------------------------------------------------------  Cardiac Enzymes Recent Labs  Lab 11/03/18 1103  TROPONINI <0.03   ------------------------------------------------------------------------------------------------------------------  RADIOLOGY:  No results found.    IMPRESSION AND PLAN:   GI bleed- patient with bright red blood per rectum.  Has never had endoscopy or colonoscopy.  Hemoglobin 8.1, hemodynamically stable. -GI consult- plan for colonoscopy -Stool studies ordered to rule out infectious cause -Protonix drip -Check PT/INR  Acute blood loss anemia on chronic microcytic anemia- hemoglobin 8.1 (baseline 9-10). -Recheck hemoglobin at 1600 -Check anemia panel, haptoglobin, LDH given patient's elevated bilirubin  Recent stroke 08/2018- no acute focal deficits -Can continue aspirin per GI -Neurology consult for clearance for colonoscopy -Continue Lipitor  Leukopenia/thrombocytopenia- leukopenia has been going on since 06/2018.  Thrombocytopenia appears chronic. Questionable history of  liver disease, with no formal diagnosis of cirrhosis. AST/ALT normal. -Hematology consult -Peripheral smear -Abdominal ultrasound  Hyperbilirubinemia- bilirubin elevated to 2.6. -Check haptoglobin, LDH, fractionated bili  Asymptomatic bacteriuria- UA with trace leukocytes and many bacteria.  Patient denies any symptoms. -Hold on antibiotics for right now -Check urine culture  CKD III- creatinine 1.6 (baseline ~1.4) -Avoid nephrotoxic  agents  Hyperlipidemia-stable -Continue home Lipitor  DVT prophylaxis-SCDs  All the records are reviewed and case discussed with ED provider. Management plans discussed with the patient, family and they are in agreement.  CODE STATUS: DNR  TOTAL TIME TAKING CARE OF THIS PATIENT: 45 minutes.    Berna Spare Mayo M.D on 11/03/2018 at 1:36 PM  Between 7am to 6pm - Pager - [phone]  After 6pm go to [email or web address] - [employer] [facility]  Office  [phone]  CC: Primary care physician; Donnie Coffin, MD   Note: This dictation was prepared with Dragon dictation along with smaller phrase technology. Any transcriptional errors that result from this process are unintentional.

## 2018-11-03 NOTE — Progress Notes (Signed)
Pt settled in bed. A/o  No resp distress. R/a. Pt had small stool upon arrival to floor.  Cleaned  Bottom looks good  No reddness or  Sores. bil lower legs dry and bil feet  Very dry and  Flakey.eccy on arms bil.

## 2018-11-03 NOTE — ED Notes (Signed)
Patient transported to Ultrasound 

## 2018-11-03 NOTE — ED Triage Notes (Signed)
PT to ED via EMS from GI office with c/o GI bleed. PT hg was 7.5 per office, denies any bleeding at this time. PT A&OX4, denies abd pain. MD at bedside. VSS

## 2018-11-03 NOTE — ED Provider Notes (Signed)
[facility] Emergency Department Provider Note       Time seen: ----------------------------------------- 11:01 AM on 11/03/2018 -----------------------------------------   I have reviewed the triage vital signs and the nursing notes.  HISTORY   Chief Complaint GI Bleeding    HPI Aaron Harrison is a 68 y.o. male with a history of arrhythmia, cellulitis, chronic atrial fibrillation, CHF, hypertension, pneumonia, CVA who presents to the ED for GI bleeding.  Patient arrives by EMS from the GI doctors office complaining of GI bleeding.  Hemoglobin was reportedly around 7-1/2 today.  He denies any active bleeding.  He denies any abdominal pain.  Patient reports she has been bleeding for months, currently takes an aspirin daily.  Past Medical History:  Diagnosis Date  . Arrhythmia   . Cellulitis   . Chronic atrial fibrillation    a. not on long term anticoagulation  . Chronic systolic CHF (congestive heart failure) ([place])   . Hypertension   . Pneumonia 2011  . Stroke [facility]) 08/2018    Patient Active Problem List   Diagnosis Date Noted  . Chronic HF (heart failure) ([place]) 10/07/2018  . Stroke (cerebrum) ([place]) 09/12/2018  . Fall 09/10/2018  . Back pain 09/10/2018  . Chronic renal insufficiency 07/19/2015  . Mild cognitive impairment 07/06/2015  . NSVT (nonsustained ventricular tachycardia) ([place])   . Liver disease   . Hyperkalemia 06/10/2015  . Ascites 06/10/2015  . Coagulopathy ([place]) 06/10/2015  . Essential hypertension 05/11/2015  . Bilateral edema of lower extremity   . Atrial fibrillation, chronic 04/07/2015  . Stasis ulcer of lower extremity ([place]) 04/07/2015    Past Surgical History:  Procedure Laterality Date  . PERIPHERAL VASCULAR CATHETERIZATION N/A 06/27/2015   Procedure: Dialysis/Perma Catheter Insertion;  Surgeon: Algernon Huxley, MD;  Location: [place] [facility];  Service: Cardiovascular;  Laterality: N/A;  . PERIPHERAL VASCULAR  CATHETERIZATION Right 07/05/2015   Procedure: Dialysis/Perma Catheter Removal;  Surgeon: Algernon Huxley, MD;  Location: [place] [facility];  Service: Cardiovascular;  Laterality: Right;    Allergies Penicillins  Social History Social History   Tobacco Use  . Smoking status: Never Smoker  . Smokeless tobacco: Never Used  Substance Use Topics  . Alcohol use: No  . Drug use: No   Review of Systems Constitutional: Negative for fever. Cardiovascular: Negative for chest pain. Respiratory: Negative for shortness of breath. Gastrointestinal: Negative for abdominal pain, vomiting and diarrhea.  Positive for GI bleeding Musculoskeletal: Negative for back pain. Skin: Negative for rash. Neurological: Negative for headaches, focal weakness or numbness.  All systems negative/normal/unremarkable except as stated in the HPI  ____________________________________________   PHYSICAL EXAM:  VITAL SIGNS: ED Triage Vitals  Enc Vitals Group     BP 11/03/18 1054 (!) 150/72     Pulse Rate 11/03/18 1055 72     Resp 11/03/18 1054 16     Temp 11/03/18 1055 98.1 F (36.7 C)     Temp Source 11/03/18 1055 Oral     SpO2 --      Weight --      Height --      Head Circumference --      Peak Flow --      Pain Score 11/03/18 1054 0     Pain Loc --      Pain Edu? --      Excl. in [place]? --    Constitutional: Alert and oriented. Well appearing and in no distress. Eyes: Conjunctivae are normal. Normal extraocular  movements. ENT      Head: Normocephalic and atraumatic.      Nose: No congestion/rhinnorhea.      Mouth/Throat: Mucous membranes are moist.      Neck: No stridor. Cardiovascular: Normal rate, regular rhythm. No murmurs, rubs, or gallops. Respiratory: Normal respiratory effort without tachypnea nor retractions. Breath sounds are clear and equal bilaterally. No wheezes/rales/rhonchi. Gastrointestinal: Soft and nontender. Normal bowel sounds.  Umbilical hernia is noted Musculoskeletal:  Nontender with normal range of motion in extremities. No lower extremity tenderness nor edema. Neurologic:  Normal speech and language. No gross focal neurologic deficits are appreciated.  Skin:  Skin is warm, dry and intact. No rash noted. Psychiatric: Mood and affect are normal. Speech and behavior are normal.  ____________________________________________  EKG: Interpreted by me.  Sinus rhythm the rate of 72 bpm, normal PR interval, borderline wide QRS, normal QT  ____________________________________________  ED COURSE:  As part of my medical decision making, I reviewed the following data within the [place] History obtained from family if available, nursing notes, old chart and ekg, as well as notes from prior ED visits. Patient presented for GI bleeding, we will assess with labs and imaging as indicated at this time.   Procedures ____________________________________________   LABS (pertinent positives/negatives)  Labs Reviewed  CBC WITH DIFFERENTIAL/PLATELET - Abnormal; Notable for the following components:      Result Value   WBC 1.8 (*)    RBC 2.60 (*)    Hemoglobin 8.1 (*)    HCT 24.6 (*)    RDW 16.2 (*)    Platelets 75 (*)    Neutro Abs 1.1 (*)    Lymphs Abs 0.4 (*)    All other components within normal limits  COMPREHENSIVE METABOLIC PANEL - Abnormal; Notable for the following components:   CO2 20 (*)    Glucose, Bld 112 (*)    BUN 25 (*)    Creatinine, Ser 1.62 (*)    Calcium 8.6 (*)    Albumin 3.1 (*)    AST 12 (*)    Total Bilirubin 2.6 (*)    GFR calc non Af Amer 44 (*)    GFR calc Af Amer 51 (*)    All other components within normal limits  URINALYSIS, COMPLETE (UACMP) WITH MICROSCOPIC - Abnormal; Notable for the following components:   Color, Urine YELLOW (*)    APPearance CLEAR (*)    Leukocytes, UA TRACE (*)    Bacteria, UA MANY (*)    All other components within normal limits  LIPASE, BLOOD  TROPONIN I  TYPE AND SCREEN    ____________________________________________   DIFFERENTIAL DIAGNOSIS   Gastrointestinal bleeding, anemia, dehydration, electrolyte abnormality  FINAL ASSESSMENT AND PLAN  Gastrointestinal bleeding   Plan: The patient had presented for GI bleeding. Patient's labs did reveal acute on chronic pancytopenia and some chronic kidney disease.  Patient was sent here for admission by gastroenterology.  I will discuss with the hospitalist for admission.   Laurence Aly, MD    Note: This note was generated in part or whole with voice recognition software. Voice recognition is usually quite accurate but there are transcription errors that can and very often do occur. I apologize for any typographical errors that were not detected and corrected.     Earleen Newport, MD 11/03/18 1227

## 2018-11-04 ENCOUNTER — Encounter: Payer: Self-pay | Admitting: Internal Medicine

## 2018-11-04 DIAGNOSIS — D696 Thrombocytopenia, unspecified: Secondary | ICD-10-CM

## 2018-11-04 DIAGNOSIS — K922 Gastrointestinal hemorrhage, unspecified: Secondary | ICD-10-CM

## 2018-11-04 DIAGNOSIS — N183 Chronic kidney disease, stage 3 (moderate): Secondary | ICD-10-CM

## 2018-11-04 DIAGNOSIS — D61818 Other pancytopenia: Secondary | ICD-10-CM

## 2018-11-04 DIAGNOSIS — K625 Hemorrhage of anus and rectum: Secondary | ICD-10-CM

## 2018-11-04 LAB — CBC
HCT: 21.9 % — ABNORMAL LOW (ref 39.0–52.0)
HCT: 24.5 % — ABNORMAL LOW (ref 39.0–52.0)
HEMATOCRIT: 26.4 % — AB (ref 39.0–52.0)
Hemoglobin: 7.1 g/dL — ABNORMAL LOW (ref 13.0–17.0)
Hemoglobin: 8.1 g/dL — ABNORMAL LOW (ref 13.0–17.0)
Hemoglobin: 8.7 g/dL — ABNORMAL LOW (ref 13.0–17.0)
MCH: 30.9 pg (ref 26.0–34.0)
MCH: 30.6 pg (ref 26.0–34.0)
MCH: 30.7 pg (ref 26.0–34.0)
MCHC: 32.4 g/dL (ref 30.0–36.0)
MCHC: 33.1 g/dL (ref 30.0–36.0)
MCHC: 33 g/dL (ref 30.0–36.0)
MCV: 95.2 fL (ref 80.0–100.0)
MCV: 92.5 fL (ref 80.0–100.0)
MCV: 93.3 fL (ref 80.0–100.0)
PLATELETS: 64 10*3/uL — AB (ref 150–400)
Platelets: 64 10*3/uL — ABNORMAL LOW (ref 150–400)
Platelets: 67 10*3/uL — ABNORMAL LOW (ref 150–400)
RBC: 2.3 MIL/uL — ABNORMAL LOW (ref 4.22–5.81)
RBC: 2.65 MIL/uL — ABNORMAL LOW (ref 4.22–5.81)
RBC: 2.83 MIL/uL — ABNORMAL LOW (ref 4.22–5.81)
RDW: 16.2 % — ABNORMAL HIGH (ref 11.5–15.5)
RDW: 16 % — ABNORMAL HIGH (ref 11.5–15.5)
RDW: 16 % — AB (ref 11.5–15.5)
WBC: 1.3 10*3/uL — CL (ref 4.0–10.5)
WBC: 1.6 10*3/uL — ABNORMAL LOW (ref 4.0–10.5)
WBC: 1.5 10*3/uL — ABNORMAL LOW (ref 4.0–10.5)
nRBC: 0 % (ref 0.0–0.2)
nRBC: 0 % (ref 0.0–0.2)
nRBC: 0 % (ref 0.0–0.2)

## 2018-11-04 LAB — BASIC METABOLIC PANEL
Anion gap: 6 (ref 5–15)
BUN: 25 mg/dL — ABNORMAL HIGH (ref 8–23)
CO2: 22 mmol/L (ref 22–32)
Calcium: 8.3 mg/dL — ABNORMAL LOW (ref 8.9–10.3)
Chloride: 107 mmol/L (ref 98–111)
Creatinine, Ser: 1.64 mg/dL — ABNORMAL HIGH (ref 0.61–1.24)
GFR calc Af Amer: 50 mL/min — ABNORMAL LOW (ref >60)
GFR calc non Af Amer: 43 mL/min — ABNORMAL LOW (ref >60)
Glucose, Bld: 103 mg/dL — ABNORMAL HIGH (ref 70–99)
Potassium: 3.7 mmol/L (ref 3.5–5.1)
Sodium: 135 mmol/L (ref 135–145)

## 2018-11-04 LAB — PREPARE RBC (CROSSMATCH)

## 2018-11-04 LAB — PATHOLOGIST SMEAR REVIEW

## 2018-11-04 MED ORDER — ACETAMINOPHEN 325 MG PO TABS
650.0000 mg | ORAL_TABLET | Freq: Once | ORAL | Status: AC
  Administered 2018-11-04: 10:00:00 650 mg via ORAL
  Filled 2018-11-04: qty 2

## 2018-11-04 MED ORDER — DIPHENHYDRAMINE HCL 50 MG/ML IJ SOLN
25.0000 mg | Freq: Once | INTRAMUSCULAR | Status: AC
  Administered 2018-11-04: 10:00:00 25 mg via INTRAVENOUS
  Filled 2018-11-04: qty 0.5

## 2018-11-04 MED ORDER — SODIUM CHLORIDE 0.9% IV SOLUTION
Freq: Once | INTRAVENOUS | Status: AC
  Administered 2018-11-04: 10:00:00 via INTRAVENOUS

## 2018-11-04 MED ORDER — POLYETHYLENE GLYCOL 3350 17 GM/SCOOP PO POWD
1.0000 | Freq: Once | ORAL | Status: AC
  Administered 2018-11-04: 255 g via ORAL
  Filled 2018-11-04: qty 255

## 2018-11-04 MED ORDER — FUROSEMIDE 10 MG/ML IJ SOLN
20.0000 mg | Freq: Once | INTRAMUSCULAR | Status: AC
  Administered 2018-11-04: 15:00:00 20 mg via INTRAVENOUS
  Filled 2018-11-04: qty 2

## 2018-11-04 MED ORDER — CYANOCOBALAMIN 500 MCG PO TABS
250.0000 ug | ORAL_TABLET | Freq: Every day | ORAL | Status: DC
  Administered 2018-11-04 – 2018-11-09 (×6): 250 ug via ORAL
  Filled 2018-11-04 (×6): qty 1

## 2018-11-04 MED ORDER — CYANOCOBALAMIN 1000 MCG/ML IJ SOLN
1000.0000 ug | Freq: Once | INTRAMUSCULAR | Status: AC
  Administered 2018-11-04: 10:00:00 1000 ug via INTRAMUSCULAR
  Filled 2018-11-04: qty 1

## 2018-11-04 MED ORDER — VITAMIN B-12 1000 MCG PO TABS
1000.0000 ug | ORAL_TABLET | Freq: Every day | ORAL | Status: DC
  Administered 2018-11-04 – 2018-11-09 (×6): 1000 ug via ORAL
  Filled 2018-11-04 (×7): qty 1

## 2018-11-04 NOTE — Assessment & Plan Note (Addendum)
#  68 year old male patient with a history of stroke/congestive heart failure and CKD/chronic anemia-intermittent thrombocytopenia/neutropenia is currently admitted to hospital for rectal bleeding.  # Distal rectal mass/partially obstructing- awaiting pathology. Recommend rectal MRI to evaluate the local extent of the disease. Will also need out pt PET scan.  Discussed with Dr. Grandville Silos concerns for any imminent obstruction.  We will also make radiation oncology referral-given the bleeding.  # Chronic mild to moderate thrombocytopenia [[id number]]; and a fairly new onset of neutropenia ANC 1.1. ? Etiology. Recommend bone marrow biopsy for further evaluation.  Discussed with Dr. Kathlene Cote; will plan on the 14th.  # CKD-stage III- question etiology. Stable.   # Acute GI lower GI bleed-hemoglobin 7.7; recommend IV Iron.   #Discussed with the patient at length regarding the significant complexity of his care-likely diagnosis of rectal cancer [need for major surgery; chemoradiation] in the context of his pancytopenia/renal dysfunction; recent stroke etc.  As per the patient's preference I will reach out to his healthcare power of attorney with an update.  Patient has no living family [parents deceased; never married; no children; no siblings].   #Disposition: Patient wants to go back to [facility] at discharge.  Discussed with Dr. Darvin Neighbours.  # 40 minutes face-to-face with the patient discussing the above plan of care; more than 50% of time spent on prognosis/ natural history; counseling and coordination.  Addendum: Long discussion with patient's healthcare power of attorney Mr. Wendelyn Breslow feels patient unfortunately has poor insight into his clinical condition.  He understands the multiple comorbidities/and the seriousness of his new diagnosis of potential rectal cancer.  Discussed the treatment options at length.  As per Mrs. Steele-patient has limited resources.  He has financial constraints with  transportation from the [employer].  Patient has applied for Medicaid; awaiting response on January 16th.  If he is approved [most likely]; patient will have better financial situation.

## 2018-11-04 NOTE — Clinical Social Work Note (Signed)
Clinical Social Work Assessment  Patient Details  Name: Aaron Harrison MRN: [id number] Date of Birth: 1951/07/05  Date of referral:  11/04/18               Reason for consult:  Facility Placement                Permission sought to share information with:  Case Manager, Customer service manager, Family Supports Permission granted to share information::  Yes, Verbal Permission Granted  Name::        Agency::     Relationship::     Contact Information:     Housing/Transportation Living arrangements for the past 2 months:  [place] of Information:  Patient Patient Interpreter Needed:  None Criminal Activity/Legal Involvement Pertinent to Current Situation/Hospitalization:  No - Comment as needed Significant Relationships:  Friend Lives with:  Facility Resident Do you feel safe going back to the place where you live?  Yes Need for family participation in patient care:  Yes (Comment)  Care giving concerns:  Patient came from [facility].    Social Worker assessment / plan:  CSW found through chart review that patient is from [facility]. CSW met with patient to discuss discharge plan. Patient reports that he has been at [facility] for rehab and would like to return. Per patient he was living at [facility] prior to going to [facility]. Patient reports that he has a [place] and information can be shared with him. Per patient he has applied for medicaid and has [employer]. CSW also spoke with Claiborne Billings at [facility]. She confirms patient is from there and can return when ready. CSW will continue to follow for discharge planning.   Employment status:  Retired Nurse, adult PT Recommendations:  Not assessed at this time Information / Referral to community resources:  [place]  Patient/Family's Response to care:  Patient thanked CSW for assistance    Patient/Family's Understanding of and Emotional Response to Diagnosis, Current Treatment, and Prognosis:  Patient understands current treatment plan. Patient agitated with doctors and being told different information by different doctors   Emotional Assessment Appearance:  Appears stated age Attitude/Demeanor/Rapport:  Guarded Affect (typically observed):  Anxious Orientation:  Oriented to Self, Oriented to Place, Oriented to  Time Alcohol / Substance use:  Not Applicable Psych involvement (Current and /or in the community):  No (Comment)  Discharge Needs  Concerns to be addressed:  Discharge Planning Concerns Readmission within the last 30 days:  No Current discharge risk:  None Barriers to Discharge:  Continued Medical Work up   [employer], Catherine 11/04/2018, 3:35 PM

## 2018-11-04 NOTE — Progress Notes (Signed)
CRITICAL VALUE STICKER  CRITICAL VALUE: WBC 1.3  RECEIVER (on-site recipient of call): Dorna Bloom RN  DATE & TIME NOTIFIED: 11/04/18 2549   MESSENGER (representative from lab):  MD NOTIFIED: pending  TIME OF NOTIFICATION:  RESPONSE:

## 2018-11-04 NOTE — NC FL2 (Signed)
[place] LEVEL OF CARE SCREENING TOOL     IDENTIFICATION  Patient Name: Aaron Harrison Birthdate: November 07, 1950 Sex: male Admission Date (Current Location): 11/03/2018  [facility] and [place] Number:  Engineering geologist and Address:  [facility], [address], [place], [place] [address]      Provider Number: [id number]  Attending Physician Name and Address:  Gorden Harms, MD  Relative Name and Phone Number:       Current Level of Care: Hospital Recommended Level of Care: [place] Prior Approval Number:    Date Approved/Denied:   PASRR Number:    Discharge Plan: SNF    Current Diagnoses: Patient Active Problem List   Diagnosis Date Noted  . Other pancytopenia ([place]) 11/04/2018  . GI bleed 11/03/2018  . Chronic HF (heart failure) ([place]) 10/07/2018  . Stroke (cerebrum) ([place]) 09/12/2018  . Fall 09/10/2018  . Back pain 09/10/2018  . Chronic renal insufficiency 07/19/2015  . Mild cognitive impairment 07/06/2015  . NSVT (nonsustained ventricular tachycardia) ([place])   . Liver disease   . Hyperkalemia 06/10/2015  . Ascites 06/10/2015  . Coagulopathy ([place]) 06/10/2015  . Essential hypertension 05/11/2015  . Bilateral edema of lower extremity   . Atrial fibrillation, chronic 04/07/2015  . Stasis ulcer of lower extremity ([place]) 04/07/2015    Orientation RESPIRATION BLADDER Height & Weight     Self, Time, Place  Normal Continent Weight:   Height:     BEHAVIORAL SYMPTOMS/MOOD NEUROLOGICAL BOWEL NUTRITION STATUS  (none) (none) Incontinent Diet(NPO to be advanced )  AMBULATORY STATUS COMMUNICATION OF NEEDS Skin   Extensive Assist Verbally Normal                       Personal Care Assistance Level of Assistance  Bathing, Feeding, Dressing Bathing Assistance: Limited assistance Feeding assistance: Independent Dressing Assistance: Limited assistance     Functional Limitations Info  Sight, Hearing, Speech  Sight Info: Adequate Hearing Info: Adequate Speech Info: Adequate    SPECIAL CARE FACTORS FREQUENCY  PT (By licensed PT), OT (By licensed OT)     PT Frequency: 5 OT Frequency: 5            Contractures Contractures Info: Not present    Additional Factors Info  Code Status, Allergies Code Status Info: DNR Allergies Info: Penicillins            Current Medications (11/04/2018):  This is the current hospital active medication list Current Facility-Administered Medications  Medication Dose Route Frequency Provider Last Rate Last Dose  . acetaminophen (TYLENOL) tablet 650 mg  650 mg Oral Q6H PRN Mayo, Pete Pelt, MD       Or  . acetaminophen (TYLENOL) suppository 650 mg  650 mg Rectal Q6H PRN Mayo, Pete Pelt, MD      . amLODipine (NORVASC) tablet 5 mg  5 mg Oral Daily Mayo, Pete Pelt, MD      . aspirin EC tablet 81 mg  81 mg Oral Daily Mayo, Pete Pelt, MD      . atorvastatin (LIPITOR) tablet 40 mg  40 mg Oral q1800 Mayo, Pete Pelt, MD      . carvedilol (COREG) tablet 12.5 mg  12.5 mg Oral BID WC Mayo, Pete Pelt, MD      . ondansetron [facility]) tablet 4 mg  4 mg Oral Q6H PRN Mayo, Pete Pelt, MD       Or  . ondansetron [facility]) injection 4 mg  4 mg Intravenous Q6H PRN Mayo, Pete Pelt, MD      . pantoprazole (PROTONIX) 80 mg in sodium chloride 0.9 % 250 mL (0.32 mg/mL) infusion  8 mg/hr Intravenous Continuous Mayo, Pete Pelt, MD 25 mL/hr at 11/04/18 1042 8 mg/hr at 11/04/18 1042  . [START ON 11/07/2018] pantoprazole (PROTONIX) injection 40 mg  40 mg Intravenous Q12H Mayo, Pete Pelt, MD      . polyethylene glycol (MIRALAX / GLYCOLAX) packet 17 g  17 g Oral Daily PRN Mayo, Pete Pelt, MD      . polyethylene glycol powder (GLYCOLAX/MIRALAX) container 255 g  1 Container Oral Once Vanga, Tally Due, MD      . tamsulosin (FLOMAX) capsule 0.4 mg  0.4 mg Oral Daily Mayo, Pete Pelt, MD   0.4 mg at 11/04/18 1010  . traMADol (ULTRAM) tablet 50 mg  50 mg Oral Q6H PRN Mayo, Pete Pelt, MD      .  vitamin B-12 (CYANOCOBALAMIN) tablet 1,000 mcg  1,000 mcg Oral Daily Salary, Montell D, MD   1,000 mcg at 11/04/18 1010  . vitamin B-12 (CYANOCOBALAMIN) tablet 250 mcg  250 mcg Oral Daily Vanga, Tally Due, MD         Discharge Medications: Please see discharge summary for a list of discharge medications.  Relevant Imaging Results:  Relevant Lab Results:   Additional Information    Tania Steinhauser  Louretta Shorten, LCSWA

## 2018-11-04 NOTE — Progress Notes (Signed)
[place] [facility] [place] NAME: Aaron Harrison    MR#:  [id number]  DATE OF BIRTH:  12-Feb-1951  SUBJECTIVE:  Gastroenterology input appreciated, patient without complaint, noted worsening anemia-transfuse 1 unit packed red blood cells, B12 to be repleted  REVIEW OF SYSTEMS:  CONSTITUTIONAL: No fever, fatigue or weakness.  EYES: No blurred or double vision.  EARS, NOSE, AND THROAT: No tinnitus or ear pain.  RESPIRATORY: No cough, shortness of breath, wheezing or hemoptysis.  CARDIOVASCULAR: No chest pain, orthopnea, edema.  GASTROINTESTINAL: No nausea, vomiting, diarrhea or abdominal pain.  GENITOURINARY: No dysuria, hematuria.  ENDOCRINE: No polyuria, nocturia,  HEMATOLOGY: No anemia, easy bruising or bleeding SKIN: No rash or lesion. MUSCULOSKELETAL: No joint pain or arthritis.   NEUROLOGIC: No tingling, numbness, weakness.  PSYCHIATRY: No anxiety or depression.   ROS  DRUG ALLERGIES:   Allergies  Allergen Reactions  . Penicillins Nausea And Vomiting    Has patient had a PCN reaction causing immediate rash, facial/tongue/throat swelling, SOB or lightheadedness with hypotension: No Has patient had a PCN reaction causing severe rash involving mucus membranes or skin necrosis: No Has patient had a PCN reaction that required hospitalization: No Has patient had a PCN reaction occurring within the last 10 years: No If all of the above answers are "NO", then may proceed with Cephalosporin use.     VITALS:  Blood pressure (!) 121/49, pulse 69, temperature 98.2 F (36.8 C), temperature source Oral, resp. rate 18, SpO2 96 %.  PHYSICAL EXAMINATION:  GENERAL:  68 y.o.-year-old patient lying in the bed with no acute distress.  EYES: Pupils equal, round, reactive to light and accommodation. No scleral icterus. Extraocular muscles intact.  HEENT: Head atraumatic, normocephalic. Oropharynx and nasopharynx clear.  NECK:  Supple, no jugular venous  distention. No thyroid enlargement, no tenderness.  LUNGS: Normal breath sounds bilaterally, no wheezing, rales,rhonchi or crepitation. No use of accessory muscles of respiration.  CARDIOVASCULAR: S1, S2 normal. No murmurs, rubs, or gallops.  ABDOMEN: Soft, nontender, nondistended. Bowel sounds present. No organomegaly or mass.  EXTREMITIES: No pedal edema, cyanosis, or clubbing.  NEUROLOGIC: Cranial nerves II through XII are intact. Muscle strength 5/5 in all extremities. Sensation intact. Gait not checked.  PSYCHIATRIC: The patient is alert and oriented x 3.  SKIN: No obvious rash, lesion, or ulcer.   Physical Exam LABORATORY PANEL:   CBC Recent Labs  Lab 11/04/18 0530  WBC 1.3*  HGB 7.1*  HCT 21.9*  PLT 64*   ------------------------------------------------------------------------------------------------------------------  Chemistries  Recent Labs  Lab 11/03/18 1103 11/03/18 1931 11/04/18 0530  NA 135  --  135  K 3.7  --  3.7  CL 105  --  107  CO2 20*  --  22  GLUCOSE 112*  --  103*  BUN 25*  --  25*  CREATININE 1.62*  --  1.64*  CALCIUM 8.6*  --  8.3*  AST 12*  --   --   ALT 8  --   --   ALKPHOS 75  --   --   BILITOT 2.6* 2.8*  --    ------------------------------------------------------------------------------------------------------------------  Cardiac Enzymes Recent Labs  Lab 11/03/18 1103  TROPONINI <0.03   ------------------------------------------------------------------------------------------------------------------  RADIOLOGY:  US Abdomen Complete  Result Date: 11/03/2018 CLINICAL DATA:  Anemia with low platelet count EXAM: ABDOMEN ULTRASOUND COMPLETE COMPARISON:  Ultrasound 06/16/2015 FINDINGS: Gallbladder: Multiple shadowing stonesmeasuring up to 5 mm. Negative sonographic Murphy. Normal wall thickness. Common bile duct: Diameter: 4  mm Liver: No focal hepatic abnormality. Within normal limits for echogenicity. Portal vein is patent on color  Doppler imaging with normal direction of blood flow towards the liver. IVC: No abnormality visualized. Pancreas: Visualized portion unremarkable. Spleen: Slightly enlarged with volume of 427.1 cubic cm. Right Kidney: Length: 14.9 cm. Cortical echogenicity normal. No hydronephrosis. Large cyst upper pole measuring 7.2 x 6.2 x 7.7 cm. Left Kidney: Length: 12.1 cm. Cortical echogenicity normal. No hydronephrosis. Cyst in the upper pole measuring 2.2 x 2.5 x 2.6 cm. Abdominal aorta: Ectatic up to 2.9 cm. Other findings: None. IMPRESSION: 1. Gallstones without sonographic evidence for acute cholecystitis or biliary dilatation 2. Mild splenomegaly 3. Cysts within the bilateral kidneys, 1 in each kidney. 4. Abdominal aortic diameter of 2.9 cm. Ectatic abdominal aorta at risk for aneurysm development. Recommend followup by ultrasound in 5 years. This recommendation follows ACR consensus guidelines: White Paper of the ACR Incidental Findings Committee II on Vascular Findings. J Am Coll Radiol 2013; 10:789-794. Electronically Signed   By: Donavan Foil M.D.   On: 11/03/2018 18:29    ASSESSMENT AND PLAN:  *Acute GIB GI input appreciated, Neurology to decide clearance for endoscopy given recent stroke, continue Protonix drip, CBC daily, transfuse as needed  *Acute blood loss anemia on chronic microcytic anemia Worsening noted  B12 deficiency noted  Transfuse 1 unit packed red blood cells, vitamin B12 1000 mg IV x1, vitamin B12 thousand milligrams p.o. daily, CBC daily, and transfuse as needed   *Recent stroke 08/2018- no acute focal deficits Continue aspirin per gastroenterology, statin therapy  *Chronic leukopenia/thrombocytopenia Stable  Suspected due to chronic liver disease, no formal diagnosis of cirrhosis. AST/ALT normal Abdominal ultrasound noted for mild splenomegaly/gallstones  *Hyperbilirubinemia bilirubin elevated to 2.6. Liver ultrasound noted  *Asymptomatic bacteriuria Follow-up on urine  culture Continue to hold off on antibiotics  *CKD III Near baseline  Avoid nephrotoxic agents  *Hyperlipidemia stable Continue home Lipitor  DVT prophylaxis-SCDs   All the records are reviewed and case discussed with Care Management/Social Workerr. Management plans discussed with the patient, family and they are in agreement.  CODE STATUS: dnr  TOTAL TIME TAKING CARE OF THIS PATIENT: 35 minutes.     POSSIBLE D/C IN 1-2 DAYS, DEPENDING ON CLINICAL CONDITION.   Avel Peace Kim Oki M.D on 11/04/2018   Between 7am to 6pm - Pager - [phone]  After 6pm go to [email or web address] - password [email or web address] [place]  Office  [phone]  CC: Primary care physician; Donnie Coffin, MD  Note: This dictation was prepared with Dragon dictation along with smaller phrase technology. Any transcriptional errors that result from this process are unintentional.

## 2018-11-04 NOTE — Progress Notes (Signed)
Notified Dr. Marcille Blanco of critical value WBC 1.3. MD is also aware that pt hemoglobin is 7.1 and platelets is 64. No new orders at this time. Will continue to monitor.

## 2018-11-04 NOTE — Progress Notes (Signed)
Pt POA [name] reported that pt insurance is not covering pt past this Friday at [facility] care but POA does not believe pt is safe at home. Pt is also hoping to return to [facility] care for rehab. Pt seems to be having periods of forgetfulness. Oncoming nurse is aware of situation.

## 2018-11-04 NOTE — Consult Note (Signed)
[name]  Patient Care Team: Donnie Coffin, MD as PCP - General (Family Medicine) Alisa Graff, FNP as Consulting Physician (Family Medicine) Murlean Iba, MD as Consulting Physician (Nephrology)  CHIEF COMPLAINTS/PURPOSE OF CONSULTATION:  Pancytopenia  HISTORY OF PRESENTING ILLNESS:  Aaron Harrison 68 y.o.  male history of CKD- III-IV/chronic anemia-recent history of stroke [with mild left-sided residual weakness] is currently admitted the hospital for rectal bleeding.  Patient has been evaluated by GI/awaiting colonoscopy.  With regards to hematologic history-review of labs show patient has had chronic anemia since 2016 recently worsened since 2019.  Patient also had intermittent thrombocytopenia-platelets ranging anywhere between 30-90,000 since 2016.  On this admission platelets in the 60s and hemoglobin is between 7-8.  MCV is normal in 90s.  More recently since September 2019 patient's white count has been slightly trending low between 2-3; and currently 1.3 with ANC-1.1.   Patient denies having any previous bone marrow biopsies.  Patient denies any history of hepatitis or cirrhosis.  He denies any alcohol use.  Patient does admit to easy bruising which is chronic.  Interestingly patient denies any prior knowledge of his hematologic problems.   Since admission to the hospital patient denies any more blood in stools.   Review of Systems  Constitutional: Negative for chills, diaphoresis, fever, malaise/fatigue and weight loss.  HENT: Negative for nosebleeds and sore throat.   Eyes: Negative for double vision.  Respiratory: Negative for cough, hemoptysis, sputum production, shortness of breath and wheezing.   Cardiovascular: Negative for chest pain, palpitations, orthopnea and leg swelling.  Gastrointestinal: Positive for blood in stool. Negative for abdominal pain, constipation, diarrhea, heartburn, melena, nausea and vomiting.  Genitourinary:  Negative for dysuria, frequency and urgency.  Musculoskeletal: Negative for back pain and joint pain.  Skin: Negative.  Negative for itching and rash.  Neurological: Positive for focal weakness (Chronic left-sided weakness). Negative for dizziness, tingling, weakness and headaches.  Endo/Heme/Allergies: Does not bruise/bleed easily.  Psychiatric/Behavioral: Negative for depression. The patient is not nervous/anxious and does not have insomnia.      MEDICAL HISTORY:  Past Medical History:  Diagnosis Date  . Arrhythmia   . Cellulitis   . Chronic atrial fibrillation    a. not on long term anticoagulation  . Chronic systolic CHF (congestive heart failure) ([place])   . Hypertension   . Pneumonia 2011  . Stroke [facility]) 08/2018    SURGICAL HISTORY: Past Surgical History:  Procedure Laterality Date  . PERIPHERAL VASCULAR CATHETERIZATION N/A 06/27/2015   Procedure: Dialysis/Perma Catheter Insertion;  Surgeon: Algernon Huxley, MD;  Location: [place] CV LAB;  Service: Cardiovascular;  Laterality: N/A;  . PERIPHERAL VASCULAR CATHETERIZATION Right 07/05/2015   Procedure: Dialysis/Perma Catheter Removal;  Surgeon: Algernon Huxley, MD;  Location: [place] CV LAB;  Service: Cardiovascular;  Laterality: Right;    SOCIAL HISTORY: Social History   Socioeconomic History  . Marital status: Single    Spouse name: Not on file  . Number of children: Not on file  . Years of education: Not on file  . Highest education level: Not on file  Occupational History  . Not on file  Social Needs  . Financial resource strain: Not on file  . Food insecurity:    Worry: Not on file    Inability: Not on file  . Transportation needs:    Medical: Not on file    Non-medical: Not on file  Tobacco Use  . Smoking status: Never Smoker  .  Smokeless tobacco: Never Used  Substance and Sexual Activity  . Alcohol use: No  . Drug use: No  . Sexual activity: Not on file  Lifestyle  . Physical activity:    Days per  week: Not on file    Minutes per session: Not on file  . Stress: Not on file  Relationships  . Social connections:    Talks on phone: Not on file    Gets together: Not on file    Attends religious service: Not on file    Active member of club or organization: Not on file    Attends meetings of clubs or organizations: Not on file    Relationship status: Not on file  . Intimate partner violence:    Fear of current or ex partner: Not on file    Emotionally abused: Not on file    Physically abused: Not on file    Forced sexual activity: Not on file  Other Topics Concern  . Not on file  Social History Narrative   Previous to work as a Freight forwarder at [employer].  Denies any alcohol use.  Denies any smoking.  Patient lives in a senior apartments-since 2008.  Otherwise he is fairly active for his age/driving and independent of ADLs.    FAMILY HISTORY: Family History  Problem Relation Age of Onset  . Diabetes Mother   . Stroke Mother   . Heart failure Father     ALLERGIES:  is allergic to penicillins.  MEDICATIONS:  Current Facility-Administered Medications  Medication Dose Route Frequency Provider Last Rate Last Dose  . acetaminophen (TYLENOL) tablet 650 mg  650 mg Oral Q6H PRN Mayo, Pete Pelt, MD       Or  . acetaminophen (TYLENOL) suppository 650 mg  650 mg Rectal Q6H PRN Mayo, Pete Pelt, MD      . amLODipine (NORVASC) tablet 5 mg  5 mg Oral Daily Mayo, Pete Pelt, MD      . aspirin EC tablet 81 mg  81 mg Oral Daily Mayo, Pete Pelt, MD      . atorvastatin (LIPITOR) tablet 40 mg  40 mg Oral q1800 Mayo, Pete Pelt, MD   40 mg at 11/04/18 1722  . carvedilol (COREG) tablet 12.5 mg  12.5 mg Oral BID WC Mayo, Pete Pelt, MD      . ondansetron [facility]) tablet 4 mg  4 mg Oral Q6H PRN Mayo, Pete Pelt, MD       Or  . ondansetron [facility]) injection 4 mg  4 mg Intravenous Q6H PRN Mayo, Pete Pelt, MD      . pantoprazole (PROTONIX) 80 mg in sodium chloride 0.9 % 250 mL (0.32  mg/mL) infusion  8 mg/hr Intravenous Continuous Mayo, Pete Pelt, MD 25 mL/hr at 11/04/18 2050 8 mg/hr at 11/04/18 2050  . [START ON 11/07/2018] pantoprazole (PROTONIX) injection 40 mg  40 mg Intravenous Q12H Mayo, Pete Pelt, MD      . polyethylene glycol (MIRALAX / GLYCOLAX) packet 17 g  17 g Oral Daily PRN Mayo, Pete Pelt, MD      . tamsulosin (FLOMAX) capsule 0.4 mg  0.4 mg Oral Daily Mayo, Pete Pelt, MD   0.4 mg at 11/04/18 1010  . traMADol (ULTRAM) tablet 50 mg  50 mg Oral Q6H PRN Mayo, Pete Pelt, MD      . vitamin B-12 (CYANOCOBALAMIN) tablet 1,000 mcg  1,000 mcg Oral Daily Salary, Montell D, MD   1,000 mcg at 11/04/18 1010  . vitamin B-12 (  CYANOCOBALAMIN) tablet 250 mcg  250 mcg Oral Daily Lin Landsman, MD   250 mcg at 11/04/18 1722      .  PHYSICAL EXAMINATION:  Vitals:   11/04/18 1353 11/04/18 1930  BP: (!) 121/49 (!) 126/58  Pulse: 69 68  Resp: 16 20  Temp: 98.2 F (36.8 C) 98.7 F (37.1 C)  SpO2: 96% 100%   There were no vitals filed for this visit.  Physical Exam  Constitutional: He is oriented to person, place, and time and well-developed, well-nourished, and in no distress.  Patient is alone.  No acute distress.  HENT:  Head: Normocephalic and atraumatic.  Mouth/Throat: Oropharynx is clear and moist. No oropharyngeal exudate.  Eyes: Pupils are equal, round, and reactive to light.  Neck: Normal range of motion. Neck supple.  Cardiovascular: Normal rate and regular rhythm.  Pulmonary/Chest: No respiratory distress. He has no wheezes.  Decreased breath sounds bilaterally bases.  Abdominal: Soft. Bowel sounds are normal. He exhibits no distension and no mass. There is no abdominal tenderness. There is no rebound and no guarding.  Musculoskeletal: Normal range of motion.        General: No tenderness or edema.  Neurological: He is alert and oriented to person, place, and time.  Skin: Skin is warm.  Multiple chronic bruising noted.  Psychiatric: Affect normal.      LABORATORY DATA:  I have reviewed the data as listed Lab Results  Component Value Date   WBC 1.6 (L) 11/04/2018   HGB 8.1 (L) 11/04/2018   HCT 24.5 (L) 11/04/2018   MCV 92.5 11/04/2018   PLT 64 (L) 11/04/2018   Recent Labs    09/05/18 1400  09/13/18 0537 11/03/18 1103 11/03/18 1931 11/04/18 0530  NA 139   < > 136 135  --  135  K 4.0   < > 3.6 3.7  --  3.7  CL 108   < > 104 105  --  107  CO2 23   < > 25 20*  --  22  GLUCOSE 106*   < > 100* 112*  --  103*  BUN 20   < > 25* 25*  --  25*  CREATININE 1.36*   < > 1.32* 1.62*  --  1.64*  CALCIUM 8.6*   < > 8.4* 8.6*  --  8.3*  GFRNONAA 53*   < > 55* 44*  --  43*  GFRAA >60   < > >60 51*  --  50*  PROT 7.2  --   --  6.7  --   --   ALBUMIN 3.8  --   --  3.1*  --   --   AST 13*  --   --  12*  --   --   ALT 9  --   --  8  --   --   ALKPHOS 62  --   --  75  --   --   BILITOT 2.3*  --   --  2.6* 2.8*  --   BILIDIR 0.5*  --   --   --  0.7*  --   IBILI 1.8*  --   --   --  2.1*  --    < > = values in this interval not displayed.    RADIOGRAPHIC STUDIES: I have personally reviewed the radiological images as listed and agreed with the findings in the report. US Abdomen Complete  Result Date: 11/03/2018 CLINICAL DATA:  Anemia with low  platelet count EXAM: ABDOMEN ULTRASOUND COMPLETE COMPARISON:  Ultrasound 06/16/2015 FINDINGS: Gallbladder: Multiple shadowing stonesmeasuring up to 5 mm. Negative sonographic Murphy. Normal wall thickness. Common bile duct: Diameter: 4 mm Liver: No focal hepatic abnormality. Within normal limits for echogenicity. Portal vein is patent on color Doppler imaging with normal direction of blood flow towards the liver. IVC: No abnormality visualized. Pancreas: Visualized portion unremarkable. Spleen: Slightly enlarged with volume of 427.1 cubic cm. Right Kidney: Length: 14.9 cm. Cortical echogenicity normal. No hydronephrosis. Large cyst upper pole measuring 7.2 x 6.2 x 7.7 cm. Left Kidney: Length: 12.1 cm.  Cortical echogenicity normal. No hydronephrosis. Cyst in the upper pole measuring 2.2 x 2.5 x 2.6 cm. Abdominal aorta: Ectatic up to 2.9 cm. Other findings: None. IMPRESSION: 1. Gallstones without sonographic evidence for acute cholecystitis or biliary dilatation 2. Mild splenomegaly 3. Cysts within the bilateral kidneys, 1 in each kidney. 4. Abdominal aortic diameter of 2.9 cm. Ectatic abdominal aorta at risk for aneurysm development. Recommend followup by ultrasound in 5 years. This recommendation follows ACR consensus guidelines: White Paper of the ACR Incidental Findings Committee II on Vascular Findings. J Am Coll Radiol 2013; 10:789-794. Electronically Signed   By: Donavan Foil M.D.   On: 11/03/2018 18:29    Other pancytopenia ([place]) #68 year old male patient with a history of stroke/congestive heart failure and CKD/chronic anemia-intermittent thrombocytopenia/neutropenia is currently admitted to hospital for rectal bleeding.  # Chronic mild to moderate thrombocytopenia [[id number]]; and a fairly new onset of neutropenia ANC 1.1-in the context of moderate to severe anemia-No obvious evidence severe splenomegaly or hypersplenism to explain the findings.  Other etiologies possible etiology include-autoimmune process likely ITP.  However myelodysplastic syndrome-MDS is a possibility.  The etiology of anemia includes CKD. However, bone marrow biopsy is a reasonable.  B12 deficiency is a possible cause-but usually severe macrocytic changes would be noted.  Agree with B12 supplementation.  #CKD-question etiology.  #Acute GI lower GI bleed-hemoglobin 7.3; awaiting transfusion today reviewed the GI recommendations.  Recommendations: Check intrinsic antibody/antiparietal antibodies.  Check myeloma panel/kappa lambda light chain.  Erythropoietin level.  Check haptoglobin.  Discussed with the patient regarding bone marrow biopsy for further evaluation.  However given the acute issues I think is reasonable to  hold off the bone marrow biopsy while inpatient.  As patient has chronic hematologic disorder-I think bone marrow biopsy is reasonable as outpatient if his counts do not improve/get worse.   Thank you Dr.Mayo for allowing me to participate in the care of your pleasant patient. Please do not hesitate to contact me with questions or concerns in the interim.   All questions were answered. The patient knows to call the clinic with any problems, questions or concerns.  Cammie Sickle, MD 11/04/2018 10:18 PM

## 2018-11-04 NOTE — Progress Notes (Signed)
Aaron Darby, MD [address]  [place]  [place], [place] [address]  Main: [phone]  Fax: [phone] Pager: [phone]   Subjective: He continues to have ongoing loose stools mixed with blood.  Patient is receiving 1 unit of PRBCs.  He tells me that he is going back to [facility] tomorrow as his insurance is not covering for more than 48 hours, whether he undergoes procedures or not.  Tried to convince him that the work-up is in progress, he will need endoscopic evaluation   Objective: Vital signs in last 24 hours: Vitals:   11/04/18 0358 11/04/18 1015 11/04/18 1040 11/04/18 1353  BP: (!) 131/57 (!) 123/57  (!) 121/49  Pulse: 76 73 77 69  Resp: 19 20 18 16   Temp: 98.1 F (36.7 C) 98.6 F (37 C) 98.7 F (37.1 C) 98.2 F (36.8 C)  TempSrc: Oral Oral Oral Oral  SpO2: 95% 97% 98% 96%   Weight change:   Intake/Output Summary (Last 24 hours) at 11/04/2018 1602 Last data filed at 11/04/2018 1434 Gross per 24 hour  Intake 1443.83 ml  Output -  Net 1443.83 ml     Exam: Heart:: Regular rate and rhythm or S1S2 present Lungs: normal and clear to auscultation Abdomen: soft, nontender, normal bowel sounds   Lab Results: CBC Latest Ref Rng & Units 11/04/2018 11/04/2018 11/03/2018  WBC 4.0 - 10.5 K/uL 1.6(L) 1.3(LL) -  Hemoglobin 13.0 - 17.0 g/dL 8.1(L) 7.1(L) 8.4(L)  Hematocrit 39.0 - 52.0 % 24.5(L) 21.9(L) 25.4(L)  Platelets 150 - 400 K/uL 64(L) 64(L) -   CMP Latest Ref Rng & Units 11/04/2018 11/03/2018 11/03/2018  Glucose 70 - 99 mg/dL 103(H) - 112(H)  BUN 8 - 23 mg/dL 25(H) - 25(H)  Creatinine 0.61 - 1.24 mg/dL 1.64(H) - 1.62(H)  Sodium 135 - 145 mmol/L 135 - 135  Potassium 3.5 - 5.1 mmol/L 3.7 - 3.7  Chloride 98 - 111 mmol/L 107 - 105  CO2 22 - 32 mmol/L 22 - 20(L)  Calcium 8.9 - 10.3 mg/dL 8.3(L) - 8.6(L)  Total Protein 6.5 - 8.1 g/dL - - 6.7  Total Bilirubin 0.3 - 1.2 mg/dL - 2.8(H) 2.6(H)  Alkaline Phos 38 - 126 U/L - - 75  AST 15 - 41 U/L - - 12(L)    ALT 0 - 44 U/L - - 8   Micro Results: Recent Results (from the past 240 hour(s))  C difficile quick scan w PCR reflex     Status: None   Collection Time: 11/03/18  5:38 PM  Result Value Ref Range Status   C Diff antigen NEGATIVE NEGATIVE Final   C Diff toxin NEGATIVE NEGATIVE Final   C Diff interpretation No C. difficile detected.  Final    Comment: Performed at [facility], [address] [place], [place] [address]  Gastrointestinal Panel by PCR , Stool     Status: None   Collection Time: 11/03/18  5:38 PM  Result Value Ref Range Status   Campylobacter species NOT DETECTED NOT DETECTED Final   Plesimonas shigelloides NOT DETECTED NOT DETECTED Final   Salmonella species NOT DETECTED NOT DETECTED Final   Yersinia enterocolitica NOT DETECTED NOT DETECTED Final   Vibrio species NOT DETECTED NOT DETECTED Final   Vibrio cholerae NOT DETECTED NOT DETECTED Final   Enteroaggregative E coli (EAEC) NOT DETECTED NOT DETECTED Final   Enteropathogenic E coli (EPEC) NOT DETECTED NOT DETECTED Final   Enterotoxigenic E coli (ETEC) NOT DETECTED NOT DETECTED  Final   Shiga like toxin producing E coli (STEC) NOT DETECTED NOT DETECTED Final   Shigella/Enteroinvasive E coli (EIEC) NOT DETECTED NOT DETECTED Final   Cryptosporidium NOT DETECTED NOT DETECTED Final   Cyclospora cayetanensis NOT DETECTED NOT DETECTED Final   Entamoeba histolytica NOT DETECTED NOT DETECTED Final   Giardia lamblia NOT DETECTED NOT DETECTED Final   Adenovirus F40/41 NOT DETECTED NOT DETECTED Final   Astrovirus NOT DETECTED NOT DETECTED Final   Norovirus GI/GII NOT DETECTED NOT DETECTED Final   Rotavirus A NOT DETECTED NOT DETECTED Final   Sapovirus (I, II, IV, and V) NOT DETECTED NOT DETECTED Final    Comment: Performed at [facility], [address] [place], [place] [address]  MRSA PCR Screening     Status: None   Collection Time: 11/03/18  8:53 PM  Result Value Ref Range Status   MRSA by PCR  NEGATIVE NEGATIVE Final    Comment:        The GeneXpert MRSA Assay (FDA approved for NASAL specimens only), is one component of a comprehensive MRSA colonization surveillance program. It is not intended to diagnose MRSA infection nor to guide or monitor treatment for MRSA infections. Performed at [facility], [address] [place], [place] [address]    Studies/Results: US Abdomen Complete  Result Date: 11/03/2018 CLINICAL DATA:  Anemia with low platelet count EXAM: ABDOMEN ULTRASOUND COMPLETE COMPARISON:  Ultrasound 06/16/2015 FINDINGS: Gallbladder: Multiple shadowing stonesmeasuring up to 5 mm. Negative sonographic Murphy. Normal wall thickness. Common bile duct: Diameter: 4 mm Liver: No focal hepatic abnormality. Within normal limits for echogenicity. Portal vein is patent on color Doppler imaging with normal direction of blood flow towards the liver. IVC: No abnormality visualized. Pancreas: Visualized portion unremarkable. Spleen: Slightly enlarged with volume of 427.1 cubic cm. Right Kidney: Length: 14.9 cm. Cortical echogenicity normal. No hydronephrosis. Large cyst upper pole measuring 7.2 x 6.2 x 7.7 cm. Left Kidney: Length: 12.1 cm. Cortical echogenicity normal. No hydronephrosis. Cyst in the upper pole measuring 2.2 x 2.5 x 2.6 cm. Abdominal aorta: Ectatic up to 2.9 cm. Other findings: None. IMPRESSION: 1. Gallstones without sonographic evidence for acute cholecystitis or biliary dilatation 2. Mild splenomegaly 3. Cysts within the bilateral kidneys, 1 in each kidney. 4. Abdominal aortic diameter of 2.9 cm. Ectatic abdominal aorta at risk for aneurysm development. Recommend followup by ultrasound in 5 years. This recommendation follows ACR consensus guidelines: White Paper of the ACR Incidental Findings Committee II on Vascular Findings. J Am Coll Radiol 2013; 10:789-794. Electronically Signed   By: Donavan Foil M.D.   On: 11/03/2018 18:29   Medications:  I have reviewed  the patient's current medications. Prior to Admission:  Medications Prior to Admission  Medication Sig Dispense Refill Last Dose  . acetaminophen (TYLENOL) 325 MG tablet Take 2 tablets (650 mg total) by mouth every 6 (six) hours as needed for mild pain (or Fever >/= 101).   11/03/2018 at 0730  . amLODipine (NORVASC) 5 MG tablet Take 1 tablet (5 mg total) by mouth daily.   11/03/2018 at 0730  . aspirin EC 81 MG tablet Take 81 mg by mouth daily.   11/03/2018 at 0730  . atorvastatin (LIPITOR) 40 MG tablet Take 1 tablet (40 mg total) by mouth daily at 6 PM.   11/02/2018 at 1800  . carvedilol (COREG) 12.5 MG tablet Take 1 tablet (12.5 mg total) by mouth 2 (two) times daily with a meal. 60 tablet 0 11/03/2018 at 0730  .  furosemide (LASIX) 20 MG tablet Take 1 tablet (20 mg total) by mouth daily. 30 tablet 2 11/03/2018 at 0730  . tamsulosin (FLOMAX) 0.4 MG CAPS capsule Take 1 capsule (0.4 mg total) by mouth daily. 30 capsule 2 11/03/2018 at 0730  . traMADol (ULTRAM) 50 MG tablet Take 1 tablet (50 mg total) by mouth every 6 (six) hours as needed for moderate pain. 30 tablet 0 11/03/2018 at 0730   Scheduled: . amLODipine  5 mg Oral Daily  . aspirin EC  81 mg Oral Daily  . atorvastatin  40 mg Oral q1800  . carvedilol  12.5 mg Oral BID WC  . [START ON 11/07/2018] pantoprazole  40 mg Intravenous Q12H  . polyethylene glycol powder  1 Container Oral Once  . tamsulosin  0.4 mg Oral Daily  . vitamin B-12  1,000 mcg Oral Daily  . vitamin B-12  250 mcg Oral Daily   Continuous: . pantoprozole (PROTONIX) infusion 8 mg/hr (11/04/18 1042)   BUL:AGTXMIWOEHOZY **OR** acetaminophen, ondansetron **OR** ondansetron (ZOFRAN) IV, polyethylene glycol, traMADol Scheduled Meds: . amLODipine  5 mg Oral Daily  . aspirin EC  81 mg Oral Daily  . atorvastatin  40 mg Oral q1800  . carvedilol  12.5 mg Oral BID WC  . [START ON 11/07/2018] pantoprazole  40 mg Intravenous Q12H  . polyethylene glycol powder  1 Container Oral Once  .  tamsulosin  0.4 mg Oral Daily  . vitamin B-12  1,000 mcg Oral Daily  . vitamin B-12  250 mcg Oral Daily   Continuous Infusions: . pantoprozole (PROTONIX) infusion 8 mg/hr (11/04/18 1042)   PRN Meds:.acetaminophen **OR** acetaminophen, ondansetron **OR** ondansetron (ZOFRAN) IV, polyethylene glycol, traMADol   Assessment: Active Problems:   GI bleed   Other pancytopenia [facility]) Pending hematology consult Patient is evaluated by neurology who mentioned that patient can proceed with colonoscopy under monitored anesthesia with caution to maintain his blood pressures within normal limits during the procedure to reduce the risk of ischemia to watershed areas.  Overall, he is at increased risk to undergo colonoscopy.  Patient with ongoing rectal bleeding and worsening anemia which may have probably caused the initial ischemic stroke in 08/2018.  With ongoing anemia, he is at risk for further strokes, it is reasonable to proceed with EGD and colonoscopy  Plan: Clear liquid diet today Bowel prep N.p.o. past midnight EGD and colonoscopy tomorrow  I have discussed alternative options, risks & benefits,  which include, but are not limited to, bleeding, infection, perforation,respiratory complication & drug reaction.  The patient agrees with this plan & written consent will be obtained.     LOS: 1 day   Aaron Harrison 11/04/2018, 4:02 PM

## 2018-11-04 NOTE — Consult Note (Signed)
Reason for Consult: Procedure clearance due to history of recent embolic stroke Referring Physician:Vanga, Tally Due, MD   CC: GI bleed  HPI: Aaron Harrison is an 68 y.o. 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  Initial labs revealed PT 14.7, INR 1.16, negative troponin, BUN 25, creatinine 1.62, AST 12, ALT 8, bilirubin 2.6, decreased GFR 44 worse from previous, WBC 1.8, hemoglobin 8.1, hematocrit 24.6, platelets 75.  He is currently being followed by GI who has recommended stool studies to rule out infectious etiology as well as colonoscopy for further evaluation after ruling out infection.  Neurology is therefore consulted for clearance prior to colonoscopy given recent stroke and less than 60 days.  Patient was discharged on Eliquis however he apparently has not been taking Eliquis and has been on aspirin 81 mg once a day.  Past Medical History:  Diagnosis Date  . Arrhythmia   . Cellulitis   . Chronic atrial fibrillation    a. not on long term anticoagulation  . Chronic systolic CHF (congestive heart failure) ([place])   . Hypertension   . Pneumonia 2011  . Stroke [facility]) 08/2018    Past Surgical History:  Procedure Laterality Date  . PERIPHERAL VASCULAR CATHETERIZATION N/A 06/27/2015   Procedure: Dialysis/Perma Catheter Insertion;  Surgeon: [place] Huxley, MD;  Location: [place] [facility];  Service: Cardiovascular;  Laterality: N/A;  . PERIPHERAL VASCULAR CATHETERIZATION Right 07/05/2015   Procedure: Dialysis/Perma Catheter Removal;  Surgeon: [place] Huxley, MD;  Location: [place] [facility];  Service: Cardiovascular;  Laterality: Right;    Family History  Problem Relation Age of Onset  . Diabetes Mother   .  Stroke Mother   . Heart failure Father     Social History:  reports that he has never smoked. He has never used smokeless tobacco. He reports that he does not drink alcohol or use drugs.  Allergies  Allergen Reactions  . Penicillins Nausea And Vomiting    Has patient had a PCN reaction causing immediate rash, facial/tongue/throat swelling, SOB or lightheadedness with hypotension: No Has patient had a PCN reaction causing severe rash involving mucus membranes or skin necrosis: No Has patient had a PCN reaction that required hospitalization: No Has patient had a PCN reaction occurring within the last 10 years: No If all of the above answers are "NO", then may proceed with Cephalosporin use.     Medications:  I have reviewed the patient's current medications. Prior to Admission:  Medications Prior to Admission  Medication Sig Dispense Refill Last Dose  . acetaminophen (TYLENOL) 325 MG tablet Take 2 tablets (650 mg total) by mouth every 6 (six) hours as needed for mild pain (or Fever >/= 101).   11/03/2018 at 0730  . amLODipine (NORVASC) 5 MG tablet Take 1 tablet (5 mg total) by mouth daily.   11/03/2018 at 0730  . aspirin EC 81 MG tablet Take 81 mg by mouth daily.   11/03/2018 at 0730  . atorvastatin (LIPITOR) 40 MG tablet Take 1 tablet (40 mg total) by mouth daily at 6 PM.   11/02/2018 at 1800  . carvedilol (COREG) 12.5 MG tablet Take 1 tablet (12.5 mg total) by mouth 2 (two) times daily with a meal. 60 tablet 0 11/03/2018 at 0730  . furosemide (LASIX) 20  MG tablet Take 1 tablet (20 mg total) by mouth daily. 30 tablet 2 11/03/2018 at 0730  . tamsulosin (FLOMAX) 0.4 MG CAPS capsule Take 1 capsule (0.4 mg total) by mouth daily. 30 capsule 2 11/03/2018 at 0730  . traMADol (ULTRAM) 50 MG tablet Take 1 tablet (50 mg total) by mouth every 6 (six) hours as needed for moderate pain. 30 tablet 0 11/03/2018 at 0730   Scheduled: . amLODipine  5 mg Oral Daily  . aspirin EC  81 mg Oral Daily  . atorvastatin  40  mg Oral q1800  . carvedilol  12.5 mg Oral BID WC  . furosemide  20 mg Intravenous Once  . [START ON 11/07/2018] pantoprazole  40 mg Intravenous Q12H  . polyethylene glycol powder  1 Container Oral Once  . tamsulosin  0.4 mg Oral Daily  . vitamin B-12  1,000 mcg Oral Daily  . vitamin B-12  250 mcg Oral Daily    ROS: History obtained from the patient General ROS: negative for - chills, fatigue, fever, night sweats, weight gain or weight loss Psychological ROS: negative for - behavioral disorder, hallucinations, memory difficulties, mood swings or suicidal ideation Ophthalmic ROS: negative for -  Blurred or double vision, eye pain or loss of vision.  ENT ROS: negative for - epistaxis, nasal discharge, oral lesions, sore throat, tinnitus or vertigo Allergy and Immunology ROS: negative for - hives or itchy/watery eyes Hematological and Lymphatic ROS: negative for - bleeding problems, bruising or swollen lymph nodes Endocrine ROS: negative for - galactorrhea, hair pattern changes, polydipsia/polyuria or temperature intolerance Respiratory ROS: negative for - cough, hemoptysis. Positive for shortness of breath or wheezing Cardiovascular ROS: negative for - chest pain, dyspnea on exertion, edema or irregular heartbeat Gastrointestinal ROS: negative for - abdominal pain, diarrhea, hematemesis, nausea/vomiting or stool incontinence Genito-Urinary ROS: negative for - dysuria, hematuria, incontinence or urinary frequency/urgency Musculoskeletal ROS: negative for - joint swelling.  Positive for muscular weakness, back pain and myalgias Neurological ROS: as noted in HPI Dermatological ROS: negative for rash and skin lesion changes   Physical Examination: Blood pressure (!) 123/57, pulse 77, temperature 98.7 F (37.1 C), temperature source Oral, resp. rate 18, SpO2 98 %.  HEENT-  Normocephalic, no lesions, without obvious abnormality.  Normal external eye. conjunctiva.  Normal TM's bilaterally.   Normal auditory canals and external ears. Normal external nose, mucus membranes and septum.  Normal pharynx. Cardiovascular- S1, S2 normal, pulses palpable throughout   Lungs- chest clear, no wheezing, rales, normal symmetric air entry Abdomen- soft, non-tender; bowel sounds normal; no masses,  no organomegaly Extremities- no edema Lymph-no adenopathy palpable Musculoskeletal-no joint tenderness, deformity or swelling Skin-warm and dry, no hyperpigmentation, vitiligo, or suspicious lesions. Jaundiced and pale appearing.  Neurological Exam   Mental Status: Alert, oriented, thought content appropriate.  Speech fluent without evidence of aphasia.  Able to follow 3 step commands without difficulty. Attention span and concentration seemed appropriate  Cranial Nerves: II: Discs flat bilaterally; Visual fields grossly normal, pupils equal, round, reactive to light and accommodation III,IV, VI: ptosis not present, extra-ocular motions intact bilaterally V,VII: smile symmetric, facial light touch sensation intact VIII: hearing normal bilaterally IX,X: gag reflex present XI: bilateral shoulder shrug XII: midline tongue extension Motor: Right :  Upper extremity   5/5 Without pronator drift      Left: Upper extremity   4/5 without pronator drift Right:   Lower extremity   5/5  Left: Lower extremity   4/5 Tone and bulk:normal tone throughout; no atrophy noted Sensory: Pinprick and light touch intact bilaterally Deep Tendon Reflexes: 2+ and symmetric throughout Plantars: Right: mute                              Left: mute Cerebellar: Finger-to-nose testing intact bilaterally. Heel to shin testing normal bilaterally Gait: not tested due to safety concerns  Data Reviewed  Laboratory Studies:   Basic Metabolic Panel: Recent Labs  Lab 11/03/18 1103 11/04/18 0530  NA 135 135  K 3.7 3.7  CL 105 107  CO2 20* 22  GLUCOSE 112* 103*  BUN 25* 25*   CREATININE 1.62* 1.64*  CALCIUM 8.6* 8.3*    Liver Function Tests: Recent Labs  Lab 11/03/18 1103 11/03/18 1931  AST 12*  --   ALT 8  --   ALKPHOS 75  --   BILITOT 2.6* 2.8*  PROT 6.7  --   ALBUMIN 3.1*  --    Recent Labs  Lab 11/03/18 1103  LIPASE 19   No results for input(s): AMMONIA in the last 168 hours.  CBC: Recent Labs  Lab 11/03/18 1103 11/03/18 2018 11/04/18 0530  WBC 1.8*  --  1.3*  NEUTROABS 1.1*  --   --   HGB 8.1* 8.4* 7.1*  HCT 24.6* 25.4* 21.9*  MCV 94.6  --  95.2  PLT 75*  --  64*    Cardiac Enzymes: Recent Labs  Lab 11/03/18 1103  TROPONINI <0.03    BNP: Invalid input(s): POCBNP  CBG: No results for input(s): GLUCAP in the last 168 hours.  Microbiology: Results for orders placed or performed during the hospital encounter of 11/03/18  C difficile quick scan w PCR reflex     Status: None   Collection Time: 11/03/18  5:38 PM  Result Value Ref Range Status   C Diff antigen NEGATIVE NEGATIVE Final   C Diff toxin NEGATIVE NEGATIVE Final   C Diff interpretation No C. difficile detected.  Final    Comment: Performed at [facility], [address] [place], [place] [address]  Gastrointestinal Panel by PCR , Stool     Status: None   Collection Time: 11/03/18  5:38 PM  Result Value Ref Range Status   Campylobacter species NOT DETECTED NOT DETECTED Final   Plesimonas shigelloides NOT DETECTED NOT DETECTED Final   Salmonella species NOT DETECTED NOT DETECTED Final   Yersinia enterocolitica NOT DETECTED NOT DETECTED Final   Vibrio species NOT DETECTED NOT DETECTED Final   Vibrio cholerae NOT DETECTED NOT DETECTED Final   Enteroaggregative E coli (EAEC) NOT DETECTED NOT DETECTED Final   Enteropathogenic E coli (EPEC) NOT DETECTED NOT DETECTED Final   Enterotoxigenic E coli (ETEC) NOT DETECTED NOT DETECTED Final   Shiga like toxin producing E coli (STEC) NOT DETECTED NOT DETECTED Final   Shigella/Enteroinvasive E coli (EIEC) NOT  DETECTED NOT DETECTED Final   Cryptosporidium NOT DETECTED NOT DETECTED Final   Cyclospora cayetanensis NOT DETECTED NOT DETECTED Final   Entamoeba histolytica NOT DETECTED NOT DETECTED Final   Giardia lamblia NOT DETECTED NOT DETECTED Final   Adenovirus F40/41 NOT DETECTED NOT DETECTED Final   Astrovirus NOT DETECTED NOT DETECTED Final   Norovirus GI/GII NOT DETECTED NOT DETECTED Final   Rotavirus A NOT DETECTED NOT DETECTED Final   Sapovirus (I, II, IV, and V) NOT DETECTED NOT DETECTED Final    Comment:  Performed at [facility], [address] [place], [place] [address]  MRSA PCR Screening     Status: None   Collection Time: 11/03/18  8:53 PM  Result Value Ref Range Status   MRSA by PCR NEGATIVE NEGATIVE Final    Comment:        The GeneXpert MRSA Assay (FDA approved for NASAL specimens only), is one component of a comprehensive MRSA colonization surveillance program. It is not intended to diagnose MRSA infection nor to guide or monitor treatment for MRSA infections. Performed at [facility], [address] [place], [place] [address]     Coagulation Studies: Recent Labs    11/03/18 1101  LABPROT 14.7  INR 1.16    Urinalysis:  Recent Labs  Lab 11/03/18 1103  COLORURINE YELLOW*  LABSPEC 1.013  PHURINE 5.0  GLUCOSEU NEGATIVE  HGBUR NEGATIVE  BILIRUBINUR NEGATIVE  KETONESUR NEGATIVE  PROTEINUR NEGATIVE  NITRITE NEGATIVE  LEUKOCYTESUR TRACE*    Lipid Panel:     Component Value Date/Time   CHOL 156 09/12/2018 1552   TRIG 99 09/12/2018 1552   HDL 28 (L) 09/12/2018 1552   CHOLHDL 5.6 09/12/2018 1552   VLDL 20 09/12/2018 1552   LDLCALC 108 (H) 09/12/2018 1552    HgbA1C:  Lab Results  Component Value Date   HGBA1C 4.7 (L) 09/12/2018    Urine Drug Screen:  No results found for: LABOPIA, COCAINSCRNUR, LABBENZ, AMPHETMU, THCU, LABBARB  Alcohol Level: No results for input(s): ETH in the last 168 hours.  Other results: EKG: normal  EKG, normal sinus rhythm, unchanged from previous tracings. Vent. rate 72 BPM PR interval * ms QRS duration 116 ms QT/QTc 424/464 ms P-R-T axes 60 76 264  Imaging: US Abdomen Complete  Result Date: 11/03/2018 CLINICAL DATA:  Anemia with low platelet count EXAM: ABDOMEN ULTRASOUND COMPLETE COMPARISON:  Ultrasound 06/16/2015 FINDINGS: Gallbladder: Multiple shadowing stonesmeasuring up to 5 mm. Negative sonographic Murphy. Normal wall thickness. Common bile duct: Diameter: 4 mm Liver: No focal hepatic abnormality. Within normal limits for echogenicity. Portal vein is patent on color Doppler imaging with normal direction of blood flow towards the liver. IVC: No abnormality visualized. Pancreas: Visualized portion unremarkable. Spleen: Slightly enlarged with volume of 427.1 cubic cm. Right Kidney: Length: 14.9 cm. Cortical echogenicity normal. No hydronephrosis. Large cyst upper pole measuring 7.2 x 6.2 x 7.7 cm. Left Kidney: Length: 12.1 cm. Cortical echogenicity normal. No hydronephrosis. Cyst in the upper pole measuring 2.2 x 2.5 x 2.6 cm. Abdominal aorta: Ectatic up to 2.9 cm. Other findings: None. IMPRESSION: 1. Gallstones without sonographic evidence for acute cholecystitis or biliary dilatation 2. Mild splenomegaly 3. Cysts within the bilateral kidneys, 1 in each kidney. 4. Abdominal aortic diameter of 2.9 cm. Ectatic abdominal aorta at risk for aneurysm development. Recommend followup by ultrasound in 5 years. This recommendation follows ACR consensus guidelines: White Paper of the ACR Incidental Findings Committee II on Vascular Findings. J Am Coll Radiol 2013; 10:789-794. Electronically Signed   By: Donavan Foil M.D.   On: 11/03/2018 18:29    Patient seen and examined.  Clinical course and management discussed.  Necessary edits performed.  I agree with the above.  Assessment and plan of care developed and discussed below.    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 Neurology consulted for clearance prior to colonoscopy given recent (83/37/4451) embolic infarcts. With recent infarcts and history of afib within the last 60 days patient at increased of complications with procedure.  Patient will not be undergoing general anesthesia though, and is currently in sinus rhythm which should help decrease risk as well.  Patient's recent infarcts also were within the watershed areas of the right hemisphere.  Diligent efforts should be taken to keep blood pressure from becoming low since this will likely increase risk.    11/04/2018, 1:35 PM    Alexis Goodell, MD Neurology [phone]  11/04/2018  2:38 PM

## 2018-11-05 ENCOUNTER — Inpatient Hospital Stay: Payer: Medicare HMO | Admitting: Anesthesiology

## 2018-11-05 ENCOUNTER — Encounter: Payer: Self-pay | Admitting: *Deleted

## 2018-11-05 ENCOUNTER — Encounter
Admission: EM | Disposition: A | Discharge status: Skilled Nursing Facility | Payer: Self-pay | Source: Home / Self Care | Attending: Internal Medicine

## 2018-11-05 HISTORY — PX: ESOPHAGOGASTRODUODENOSCOPY: SHX5428

## 2018-11-05 HISTORY — PX: COLONOSCOPY: SHX5424

## 2018-11-05 LAB — BPAM RBC
Blood Product Expiration Date: 202001282359
ISSUE DATE / TIME: 202001091022
Unit Type and Rh: 9500

## 2018-11-05 LAB — TYPE AND SCREEN
ABO/RH(D): O NEG
Antibody Screen: NEGATIVE
Unit division: 0

## 2018-11-05 LAB — BASIC METABOLIC PANEL
Anion gap: 8 (ref 5–15)
BUN: 22 mg/dL (ref 8–23)
CO2: 21 mmol/L — ABNORMAL LOW (ref 22–32)
Calcium: 8.2 mg/dL — ABNORMAL LOW (ref 8.9–10.3)
Chloride: 105 mmol/L (ref 98–111)
Creatinine, Ser: 1.49 mg/dL — ABNORMAL HIGH (ref 0.61–1.24)
GFR calc Af Amer: 56 mL/min — ABNORMAL LOW (ref >60)
GFR, EST NON AFRICAN AMERICAN: 49 mL/min — AB (ref >60)
Glucose, Bld: 107 mg/dL — ABNORMAL HIGH (ref 70–99)
Potassium: 3.9 mmol/L (ref 3.5–5.1)
Sodium: 134 mmol/L — ABNORMAL LOW (ref 135–145)

## 2018-11-05 LAB — HEPATIC FUNCTION PANEL
ALT: 7 U/L (ref 0–44)
AST: 11 U/L — ABNORMAL LOW (ref 15–41)
Albumin: 2.8 g/dL — ABNORMAL LOW (ref 3.5–5.0)
Alkaline Phosphatase: 70 U/L (ref 38–126)
Bilirubin, Direct: 0.5 mg/dL — ABNORMAL HIGH (ref 0.0–0.2)
Indirect Bilirubin: 2.5 mg/dL — ABNORMAL HIGH (ref 0.3–0.9)
TOTAL PROTEIN: 6 g/dL — AB (ref 6.5–8.1)
Total Bilirubin: 3 mg/dL — ABNORMAL HIGH (ref 0.3–1.2)

## 2018-11-05 LAB — CBC
HCT: 25.9 % — ABNORMAL LOW (ref 39.0–52.0)
Hemoglobin: 8.5 g/dL — ABNORMAL LOW (ref 13.0–17.0)
MCH: 31 pg (ref 26.0–34.0)
MCHC: 32.8 g/dL (ref 30.0–36.0)
MCV: 94.5 fL (ref 80.0–100.0)
NRBC: 0 % (ref 0.0–0.2)
Platelets: 65 10*3/uL — ABNORMAL LOW (ref 150–400)
RBC: 2.74 MIL/uL — ABNORMAL LOW (ref 4.22–5.81)
RDW: 16.6 % — ABNORMAL HIGH (ref 11.5–15.5)
WBC: 1.5 10*3/uL — ABNORMAL LOW (ref 4.0–10.5)

## 2018-11-05 LAB — HAPTOGLOBIN: Haptoglobin: 159 mg/dL (ref 32–363)

## 2018-11-05 SURGERY — EGD (ESOPHAGOGASTRODUODENOSCOPY)
Anesthesia: General

## 2018-11-05 MED ORDER — PROPOFOL 500 MG/50ML IV EMUL
INTRAVENOUS | Status: AC
  Filled 2018-11-05: qty 50

## 2018-11-05 MED ORDER — SODIUM CHLORIDE 0.9 % IV SOLN
1.0000 g | Freq: Every day | INTRAVENOUS | Status: DC
  Administered 2018-11-05 – 2018-11-07 (×3): 1 g via INTRAVENOUS
  Filled 2018-11-05: qty 10
  Filled 2018-11-05: qty 1
  Filled 2018-11-05: qty 10

## 2018-11-05 MED ORDER — MIDAZOLAM HCL 2 MG/2ML IJ SOLN
INTRAMUSCULAR | Status: AC
  Filled 2018-11-05: qty 2

## 2018-11-05 MED ORDER — FENTANYL CITRATE (PF) 100 MCG/2ML IJ SOLN
INTRAMUSCULAR | Status: AC
  Filled 2018-11-05: qty 2

## 2018-11-05 MED ORDER — PEG 3350-KCL-NA BICARB-NACL 420 G PO SOLR
4000.0000 mL | Freq: Once | ORAL | Status: AC
  Administered 2018-11-05: 4000 mL via ORAL
  Filled 2018-11-05: qty 4000

## 2018-11-05 MED ORDER — PROPOFOL 10 MG/ML IV BOLUS
INTRAVENOUS | Status: DC | PRN
  Administered 2018-11-05: 40 mg via INTRAVENOUS
  Administered 2018-11-05: 50 mg via INTRAVENOUS
  Administered 2018-11-06: 200 ug/kg/min via INTRAVENOUS

## 2018-11-05 MED ORDER — EPHEDRINE SULFATE 50 MG/ML IJ SOLN
INTRAMUSCULAR | Status: DC | PRN
  Administered 2018-11-05: 5 mg via INTRAVENOUS

## 2018-11-05 MED ORDER — SODIUM CHLORIDE 0.9 % IV BOLUS
500.0000 mL | Freq: Once | INTRAVENOUS | Status: AC
  Administered 2018-11-05: 11:00:00 500 mL via INTRAVENOUS

## 2018-11-05 MED ORDER — CHLORHEXIDINE GLUCONATE CLOTH 2 % EX PADS
6.0000 | MEDICATED_PAD | Freq: Once | CUTANEOUS | Status: AC
  Administered 2018-11-05: 11:00:00 6 via TOPICAL

## 2018-11-05 MED ORDER — SODIUM CHLORIDE 0.9 % IV SOLN
INTRAVENOUS | Status: DC
  Administered 2018-11-05: 13:00:00 via INTRAVENOUS
  Administered 2018-11-05: 1000 mL via INTRAVENOUS

## 2018-11-05 MED ORDER — PROPOFOL 500 MG/50ML IV EMUL
INTRAVENOUS | Status: DC | PRN
  Administered 2018-11-05: 180 ug/kg/min via INTRAVENOUS

## 2018-11-05 MED ORDER — PANTOPRAZOLE SODIUM 40 MG PO TBEC
40.0000 mg | DELAYED_RELEASE_TABLET | Freq: Every day | ORAL | Status: DC
  Administered 2018-11-05 – 2018-11-09 (×5): 40 mg via ORAL
  Filled 2018-11-05 (×5): qty 1

## 2018-11-05 MED ORDER — PHENYLEPHRINE HCL 10 MG/ML IJ SOLN
INTRAMUSCULAR | Status: DC | PRN
  Administered 2018-11-05: 50 ug via INTRAVENOUS
  Administered 2018-11-05: 100 ug via INTRAVENOUS

## 2018-11-05 NOTE — Clinical Social Work Note (Signed)
CSW began [facility]) authorization. CSW will continue to follow for discharge planning.    [place], [place]

## 2018-11-05 NOTE — Progress Notes (Signed)
Colon full of black stool. Colonoscopy procedure aborted per Dr. Marius Ditch

## 2018-11-05 NOTE — Anesthesia Procedure Notes (Signed)
Date/Time: 11/05/2018 1:20 PM Performed by: Allean Found, CRNA Pre-anesthesia Checklist: Patient identified, Emergency Drugs available, Suction available, Patient being monitored and Timeout performed Oxygen Delivery Method: Nasal cannula Placement Confirmation: positive ETCO2

## 2018-11-05 NOTE — Op Note (Signed)
[facility] Gastroenterology Patient Name: Aaron Harrison Procedure Date: 11/05/2018 12:09 PM MRN: [id number] Account #: [id number] Date of Birth: 1951/04/13 Admit Type: Inpatient Age: 68 Room: [facility] ENDO ROOM 2 Gender: Male Note Status: Finalized Procedure:            Colonoscopy Indications:          Hematochezia, Iron deficiency anemia secondary to                        chronic blood loss Providers:            Lin Landsman MD, MD Referring MD:         [name]. [name] MD (Referring MD) Medicines:            Monitored Anesthesia Care Complications:        No immediate complications. Estimated blood loss: None. Procedure:            Pre-Anesthesia Assessment:                       - Prior to the procedure, a History and Physical was                        performed, and patient medications and allergies were                        reviewed. The patient is competent. The risks and                        benefits of the procedure and the sedation options and                        risks were discussed with the patient. All questions                        were answered and informed consent was obtained.                        Patient identification and proposed procedure were                        verified by the physician, the nurse, the                        anesthesiologist, the anesthetist and the technician in                        the pre-procedure area in the procedure room in the                        endoscopy suite. Mental Status Examination: alert and                        oriented. Airway Examination: normal oropharyngeal                        airway and neck mobility. Respiratory Examination:                        clear to auscultation. CV Examination: normal.  Prophylactic Antibiotics: The patient does not require                        prophylactic antibiotics. Prior Anticoagulants: The                        patient has  taken no previous anticoagulant or                        antiplatelet agents. ASA Grade Assessment: IV - A                        patient with severe systemic disease that is a constant                        threat to life. After reviewing the risks and benefits,                        the patient was deemed in satisfactory condition to                        undergo the procedure. The anesthesia plan was to use                        monitored anesthesia care (MAC). Immediately prior to                        administration of medications, the patient was                        re-assessed for adequacy to receive sedatives. The                        heart rate, respiratory rate, oxygen saturations, blood                        pressure, adequacy of pulmonary ventilation, and                        response to care were monitored throughout the                        procedure. The physical status of the patient was                        re-assessed after the procedure.                       After obtaining informed consent, the colonoscope was                        passed under direct vision. Throughout the procedure,                        the patient's blood pressure, pulse, and oxygen                        saturations were monitored continuously. The  Colonoscope was introduced through the anus with the                        intention of advancing to the cecum. The scope was                        advanced to the rectum before the procedure was                        aborted. Medications were given. The colonoscopy was                        performed with difficulty due to poor bowel prep with                        stool present. Successful completion of the procedure                        was aided by {skip}. Findings:      Hemorrhoids were found on perianal exam.      Solid brown stool in rectum      Scope withdrawn, procedure aborted Impression:            - Hemorrhoids found on perianal exam.                       - No specimens collected. Recommendation:       - Return patient to hospital ward for ongoing care.                       - Clear liquid diet today.                       - Repeat colonoscopy tomorrow because the bowel                        preparation was poor if patient agrees, otherwise                        perform as outpt. Procedure Code(s):    --- Professional ---                       [phone], 60, Colonoscopy, flexible; diagnostic, including                        collection of specimen(s) by brushing or washing, when                        performed (separate procedure) Diagnosis Code(s):    --- Professional ---                       K64.9, Unspecified hemorrhoids                       K92.1, Melena (includes Hematochezia)                       D50.0, Iron deficiency anemia secondary to blood loss                        (chronic) CPT  copyright 2018 American Medical Association. All rights reserved. The codes documented in this report are preliminary and upon coder review may  be revised to meet current compliance requirements. Dr. Ulyess Mort Lin Landsman MD, MD 11/05/2018 1:40:11 PM This report has been signed electronically. Number of Addenda: 0 Note Initiated On: 11/05/2018 12:09 PM Total Procedure Duration: 0 hours 0 minutes 27 seconds       [facility]

## 2018-11-05 NOTE — Progress Notes (Addendum)
[facility] [place] NAME: Aaron Harrison    MR#:  [id number]  DATE OF BIRTH:  08-13-1951  SUBJECTIVE:  For upper and lower endoscopy later today, urinalysis/urine culture noted start Rocephin, check liver function test-noted chronic elevated liver function tests, appears mildly jaundiced  REVIEW OF SYSTEMS:  CONSTITUTIONAL: No fever, fatigue or weakness.  EYES: No blurred or double vision.  EARS, NOSE, AND THROAT: No tinnitus or ear pain.  RESPIRATORY: No cough, shortness of breath, wheezing or hemoptysis.  CARDIOVASCULAR: No chest pain, orthopnea, edema.  GASTROINTESTINAL: No nausea, vomiting, diarrhea or abdominal pain.  GENITOURINARY: No dysuria, hematuria.  ENDOCRINE: No polyuria, nocturia,  HEMATOLOGY: No anemia, easy bruising or bleeding SKIN: No rash or lesion. MUSCULOSKELETAL: No joint pain or arthritis.   NEUROLOGIC: No tingling, numbness, weakness.  PSYCHIATRY: No anxiety or depression.   ROS  DRUG ALLERGIES:   Allergies  Allergen Reactions  . Penicillins Nausea And Vomiting    Has patient had a PCN reaction causing immediate rash, facial/tongue/throat swelling, SOB or lightheadedness with hypotension: No Has patient had a PCN reaction causing severe rash involving mucus membranes or skin necrosis: No Has patient had a PCN reaction that required hospitalization: No Has patient had a PCN reaction occurring within the last 10 years: No If all of the above answers are "NO", then may proceed with Cephalosporin use.     VITALS:  Blood pressure 126/74, pulse 79, temperature 98.3 F (36.8 C), temperature source Oral, resp. rate 20, SpO2 99 %.  PHYSICAL EXAMINATION:  GENERAL:  68 y.o.-year-old patient lying in the bed with no acute distress.  EYES: Pupils equal, round, reactive to light and accommodation. No scleral icterus. Extraocular muscles intact.  HEENT: Head atraumatic, normocephalic. Oropharynx and nasopharynx clear.   NECK:  Supple, no jugular venous distention. No thyroid enlargement, no tenderness.  LUNGS: Normal breath sounds bilaterally, no wheezing, rales,rhonchi or crepitation. No use of accessory muscles of respiration.  CARDIOVASCULAR: S1, S2 normal. No murmurs, rubs, or gallops.  ABDOMEN: Soft, nontender, nondistended. Bowel sounds present. No organomegaly or mass.  EXTREMITIES: No pedal edema, cyanosis, or clubbing.  NEUROLOGIC: Cranial nerves II through XII are intact. Muscle strength 5/5 in all extremities. Sensation intact. Gait not checked.  PSYCHIATRIC: The patient is alert and oriented x 3.  SKIN: Mild jaundice appearance    Physical Exam LABORATORY PANEL:   CBC Recent Labs  Lab 11/04/18 2225  WBC 1.5*  HGB 8.7*  HCT 26.4*  PLT 67*   ------------------------------------------------------------------------------------------------------------------  Chemistries  Recent Labs  Lab 11/03/18 1103 11/03/18 1931  11/05/18 0722  NA 135  --    < > 134*  K 3.7  --    < > 3.9  CL 105  --    < > 105  CO2 20*  --    < > 21*  GLUCOSE 112*  --    < > 107*  BUN 25*  --    < > 22  CREATININE 1.62*  --    < > 1.49*  CALCIUM 8.6*  --    < > 8.2*  AST 12*  --   --   --   ALT 8  --   --   --   ALKPHOS 75  --   --   --   BILITOT 2.6* 2.8*  --   --    < > = values in this interval not displayed.   ------------------------------------------------------------------------------------------------------------------  Cardiac Enzymes  Recent Labs  Lab 11/03/18 1103  TROPONINI <0.03   ------------------------------------------------------------------------------------------------------------------  RADIOLOGY:  US Abdomen Complete  Result Date: 11/03/2018 CLINICAL DATA:  Anemia with low platelet count EXAM: ABDOMEN ULTRASOUND COMPLETE COMPARISON:  Ultrasound 06/16/2015 FINDINGS: Gallbladder: Multiple shadowing stonesmeasuring up to 5 mm. Negative sonographic Murphy. Normal wall thickness.  Common bile duct: Diameter: 4 mm Liver: No focal hepatic abnormality. Within normal limits for echogenicity. Portal vein is patent on color Doppler imaging with normal direction of blood flow towards the liver. IVC: No abnormality visualized. Pancreas: Visualized portion unremarkable. Spleen: Slightly enlarged with volume of 427.1 cubic cm. Right Kidney: Length: 14.9 cm. Cortical echogenicity normal. No hydronephrosis. Large cyst upper pole measuring 7.2 x 6.2 x 7.7 cm. Left Kidney: Length: 12.1 cm. Cortical echogenicity normal. No hydronephrosis. Cyst in the upper pole measuring 2.2 x 2.5 x 2.6 cm. Abdominal aorta: Ectatic up to 2.9 cm. Other findings: None. IMPRESSION: 1. Gallstones without sonographic evidence for acute cholecystitis or biliary dilatation 2. Mild splenomegaly 3. Cysts within the bilateral kidneys, 1 in each kidney. 4. Abdominal aortic diameter of 2.9 cm. Ectatic abdominal aorta at risk for aneurysm development. Recommend followup by ultrasound in 5 years. This recommendation follows ACR consensus guidelines: White Paper of the ACR Incidental Findings Committee II on Vascular Findings. J Am Coll Radiol 2013; 10:789-794. Electronically Signed   By: Donavan Foil M.D.   On: 11/03/2018 18:29    ASSESSMENT AND PLAN:  *Acute GIB Stable For upper and lower endoscopy by gastroenterology later today for further evaluation, continue Protonix drip, CBC daily, and transfuse as needed Status post 1 unit PRBCs  *Acute gram-negative rod UTI Empiric Rocephin for 3-day course, follow-up on cultures  *Acute blood loss anemia on chronic microcytic anemia Improved status post 1 unit packed red blood cell transfusion  B12 deficiency noted on anemia work-up Given vitamin B12 1000 mg IV x1, continue vitamin B12 1000 mg daily, CBC daily, and transfuse as needed   *Recent stroke 08/2018- no acute focal deficits Continue aspirin per gastroenterology, statin therapy  *Chronic  leukopenia/thrombocytopenia Stable  Suspected due to chronic liver disease, noted mildly jaundiced skin, no formal diagnosis of cirrhosis. AST/ALT normal Abdominal ultrasound noted for mild splenomegaly/gallstones Hematology/oncology input appreciated-recommend outpatient bone biopsy for further evaluation status post discharge by Dr. 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 Management plans discussed with the patient, family and they are in agreement.  CODE STATUS: dnr  TOTAL TIME TAKING CARE OF THIS PATIENT: 35 minutes.     POSSIBLE D/C IN 1 DAYS, DEPENDING ON CLINICAL CONDITION.   Avel Peace  M.D on 11/05/2018   Between 7am to 6pm - Pager - [phone]  After 6pm go to [email or web address] - password [email or web address] [facility]  Office  [phone]  CC: Primary care physician; Donnie Coffin, MD  Note: This dictation was prepared with Dragon dictation along with smaller phrase technology. Any transcriptional errors that result from this process are unintentional.

## 2018-11-05 NOTE — Anesthesia Post-op Follow-up Note (Signed)
Anesthesia QCDR form completed.        

## 2018-11-05 NOTE — Evaluation (Signed)
Physical Therapy Evaluation Patient Details Name: Aaron Harrison MRN: [id number] DOB: 1951-09-09 Today's Date: 11/05/2018   History of Present Illness  Patient is a 68 year old male admitted from [place] with GI bleed. PMH to include Afib, HF, HTN, and CVA on 09/12/18. Patient also had recent L2 compression fracture sustained during fall with CVA.   Clinical Impression  Patient received in bed, agrees to PT eval, reports he wants to go back to [place] to continue with PT/OT there. Patient requires mod assist for supine >< sit due to Left side weakness. Patient attempted walking in the room with rw and mod assist+2. Patient with very narrow BOS and scissoring feet. Patient also has posterior/right lean when standing. Patient will benefit from continued skilled PT to address his functional limitations and return to SNF at discharge.      Follow Up Recommendations SNF;Supervision/Assistance - 24 hour    Equipment Recommendations  None recommended by PT    Recommendations for Other Services       Precautions / Restrictions Precautions Precautions: Fall Restrictions Weight Bearing Restrictions: No      Mobility  Bed Mobility Overal bed mobility: Needs Assistance Bed Mobility: Supine to Sit;Sit to Supine     Supine to sit: Mod assist Sit to supine: Mod assist   General bed mobility comments: patient having difficulty with bed mobility, requires mod assist to perform supine>< sit. Decreased use of left side due to prior CVA.   Transfers Overall transfer level: Needs assistance Equipment used: Rolling walker (2 wheeled) Transfers: Sit to/from Stand           General transfer comment: bed raised slightly, assist with balance when standing leaning to right and posteriorly  Ambulation/Gait Ambulation/Gait assistance: Mod assist;+2 physical assistance Gait Distance (Feet): 2 Feet Assistive device: Rolling walker (2 wheeled) Gait Pattern/deviations:  Scissoring;Ataxic Gait velocity: decreased   General Gait Details: patients left LE with decreased control/coordination, scissoring, unsafe  Stairs            Wheelchair Mobility    Modified Rankin (Stroke Patients Only)       Balance Overall balance assessment: Needs assistance Sitting-balance support: Bilateral upper extremity supported Sitting balance-Leahy Scale: Fair Sitting balance - Comments: fair once assisted to seated position Postural control: Posterior lean;Right lateral lean Standing balance support: Bilateral upper extremity supported Standing balance-Leahy Scale: Fair                               Pertinent Vitals/Pain Pain Assessment: Faces Faces Pain Scale: Hurts little more Pain Location: back Pain Descriptors / Indicators: Aching;Discomfort Pain Intervention(s): Limited activity within patient's tolerance;Monitored during session    [place] expects to be discharged to:: Skilled nursing facility                 Additional Comments: reports he lives at [facility]    Prior Function Level of Independence: Needs assistance   Gait / Transfers Assistance Needed: reports he walks with walker there and staff, with wheelchair to follow.   ADL's / Homemaking Assistance Needed: requires assistance        Hand Dominance   Dominant Hand: Right    Extremity/Trunk Assessment   Upper Extremity Assessment Upper Extremity Assessment: LUE deficits/detail LUE Deficits / Details: weakness, decreased rom LUE Coordination: decreased gross motor;decreased fine motor    Lower Extremity Assessment Lower Extremity Assessment: LLE deficits/detail LLE Deficits / Details: weakness  LLE Coordination: decreased gross motor;decreased fine motor       Communication   Communication: No difficulties  Cognition Arousal/Alertness: Awake/alert Behavior During Therapy: WFL for tasks assessed/performed Overall Cognitive  Status: No family/caregiver present to determine baseline cognitive functioning                                 General Comments: patient repetitive with thoughts./speech       General Comments      Exercises     Assessment/Plan    PT Assessment Patient needs continued PT services  PT Problem List Decreased strength;Decreased mobility;Decreased safety awareness;Decreased coordination;Decreased balance;Pain       PT Treatment Interventions Functional mobility training;Balance training;Patient/family education;Gait training;Therapeutic activities;Neuromuscular re-education;Therapeutic exercise    PT Goals (Current goals can be found in the Care Plan section)  Acute Rehab PT Goals Patient Stated Goal: to return to [facility] to continue his PT and OT PT Goal Formulation: With patient Time For Goal Achievement: 11/19/18 Potential to Achieve Goals: Good    Frequency Min 2X/week   Barriers to discharge        Co-evaluation               AM-PAC PT "6 Clicks" Mobility  Outcome Measure Help needed turning from your back to your side while in a flat bed without using bedrails?: A Lot Help needed moving from lying on your back to sitting on the side of a flat bed without using bedrails?: A Lot Help needed moving to and from a bed to a chair (including a wheelchair)?: A Lot Help needed standing up from a chair using your arms (e.g., wheelchair or bedside chair)?: A Lot Help needed to walk in hospital room?: A Lot Help needed climbing 3-5 steps with a railing? : Total 6 Click Score: 11    End of Session Equipment Utilized During Treatment: Gait belt Activity Tolerance: Other (comment)(treatment limited by patient requesting to sit down each time he got up to standing. ) Patient left: in bed;with bed alarm set;with call bell/phone within reach Nurse Communication: Mobility status PT Visit Diagnosis: Other abnormalities of gait and mobility  (R26.89);Unsteadiness on feet (R26.81);Muscle weakness (generalized) (M62.81);History of falling (Z91.81);Difficulty in walking, not elsewhere classified (R26.2);Hemiplegia and hemiparesis;Pain Hemiplegia - Right/Left: Left Hemiplegia - dominant/non-dominant: Non-dominant Hemiplegia - caused by: Cerebral infarction Pain - part of body: (back)    Time: 5284-1324 PT Time Calculation (min) (ACUTE ONLY): 20 min   Charges:   PT Evaluation $PT Eval Moderate Complexity: 1 Mod          Jaymere Alen, PT, GCS 11/05/18,10:00 AM

## 2018-11-05 NOTE — Care Management Important Message (Signed)
Important Message  Patient Details  Name: Aaron Harrison MRN: [id number] Date of Birth: 31-Dec-1950   Medicare Important Message Given:  Yes    [name] 11/05/2018, 11:11 AM

## 2018-11-05 NOTE — Op Note (Signed)
[facility] Patient Name: Aaron Harrison Procedure Date: 11/05/2018 12:10 PM MRN: [id number] Account #: [id number] Date of Birth: 1950-12-03 Admit Type: Inpatient Age: 68 Room: [facility] ENDO ROOM 2 Gender: Male Note Status: Finalized Procedure:            Upper GI endoscopy Indications:          Iron deficiency anemia secondary to chronic blood loss,                        Hematochezia Providers:            Lin Landsman MD, MD Referring MD:         [name]. [name] MD (Referring MD) Medicines:            Monitored Anesthesia Care Complications:        No immediate complications. Estimated blood loss: None. Procedure:            Pre-Anesthesia Assessment:                       - Prior to the procedure, a History and Physical was                        performed, and patient medications and allergies were                        reviewed. The patient is competent. The risks and                        benefits of the procedure and the sedation options and                        risks were discussed with the patient. All questions                        were answered and informed consent was obtained.                        Patient identification and proposed procedure were                        verified by the physician, the nurse, the                        anesthesiologist, the anesthetist and the technician in                        the pre-procedure area in the procedure room in the                        endoscopy suite. Mental Status Examination: alert and                        oriented. Airway Examination: normal oropharyngeal                        airway and neck mobility. Respiratory Examination:                        clear to auscultation. CV Examination: normal.  Prophylactic Antibiotics: The patient does not require                        prophylactic antibiotics. Prior Anticoagulants: The   patient has taken no previous anticoagulant or                        antiplatelet agents. ASA Grade Assessment: IV - A                        patient with severe systemic disease that is a constant                        threat to life. After reviewing the risks and benefits,                        the patient was deemed in satisfactory condition to                        undergo the procedure. The anesthesia plan was to use                        monitored anesthesia care (MAC). Immediately prior to                        administration of medications, the patient was                        re-assessed for adequacy to receive sedatives. The                        heart rate, respiratory rate, oxygen saturations, blood                        pressure, adequacy of pulmonary ventilation, and                        response to care were monitored throughout the                        procedure. The physical status of the patient was                        re-assessed after the procedure.                       After obtaining informed consent, the endoscope was                        passed under direct vision. Throughout the procedure,                        the patient's blood pressure, pulse, and oxygen                        saturations were monitored continuously. The Endoscope                        was introduced through the mouth, and advanced to the  second part of duodenum. The upper GI endoscopy was                        accomplished without difficulty. The patient tolerated                        the procedure well. Findings:      The duodenal bulb and second portion of the duodenum were normal.      Diffuse mildly erythematous mucosa without bleeding was found in the       gastric body.      Multiple non-bleeding localized erosions were found in the gastric       antrum. There were no stigmata of recent bleeding.      The cardia and gastric fundus were  normal on retroflexion.      The gastroesophageal junction and examined esophagus were normal. Impression:           - Normal duodenal bulb and second portion of the                        duodenum.                       - Erythematous mucosa in the gastric body.                       - Gastric erosions without bleeding.                       - Normal gastroesophageal junction and esophagus.                       - No specimens collected. Recommendation:       - No upper GI source of bleeding identified                       - Discontinue pantoprazole drip                       - Proceed with colonoscopy as scheduled                       - See colonoscopy report Procedure Code(s):    --- Professional ---                       [phone], Esophagogastroduodenoscopy, flexible, transoral;                        diagnostic, including collection of specimen(s) by                        brushing or washing, when performed (separate procedure) Diagnosis Code(s):    --- Professional ---                       K31.89, Other diseases of stomach and duodenum                       K25.9, Gastric ulcer, unspecified as acute or chronic,                        without hemorrhage or perforation  D50.0, Iron deficiency anemia secondary to blood loss                        (chronic)                       K92.1, Melena (includes Hematochezia) CPT copyright 2018 American Medical Association. All rights reserved. The codes documented in this report are preliminary and upon coder review may  be revised to meet current compliance requirements. Dr. Ulyess Mort Lin Landsman MD, MD 11/05/2018 1:30:52 PM This report has been signed electronically. Number of Addenda: 0 Note Initiated On: 11/05/2018 12:10 PM      [facility]

## 2018-11-05 NOTE — Anesthesia Preprocedure Evaluation (Signed)
Anesthesia Evaluation  Patient identified by MRN, date of birth, ID band  Reviewed: Allergy & Precautions, NPO status , Patient's Chart, lab work & pertinent test results  History of Anesthesia Complications Negative for: history of anesthetic complications  Airway Mallampati: II       Dental   Pulmonary neg sleep apnea, neg COPD,           Cardiovascular hypertension, Pt. on medications +CHF (LVEF 50-55%)  + dysrhythmias (hx of afib) Atrial Fibrillation      Neuro/Psych CVA (> 60 days ago, neurologist states pt high risk.)    GI/Hepatic Neg liver ROS, neg GERD  ,  Endo/Other  neg diabetes  Renal/GU Renal InsufficiencyRenal disease     Musculoskeletal   Abdominal   Peds  Hematology   Anesthesia Other Findings   Reproductive/Obstetrics                             Anesthesia Physical Anesthesia Plan  ASA: III and emergent  Anesthesia Plan: General   Post-op Pain Management:    Induction: Intravenous  PONV Risk Score and Plan: 2 and TIVA and Propofol infusion  Airway Management Planned: Nasal Cannula  Additional Equipment:   Intra-op Plan:   Post-operative Plan:   Informed Consent: I have reviewed the patients History and Physical, chart, labs and discussed the procedure including the risks, benefits and alternatives for the proposed anesthesia with the patient or authorized representative who has indicated his/her understanding and acceptance.     Plan Discussed with:   Anesthesia Plan Comments: (Pt is high risk for repeat CVA, and cardiovascular events. Pt and surgeon understand and desire to proceed. )        Anesthesia Quick Evaluation

## 2018-11-05 NOTE — Progress Notes (Signed)
Progress Note: As addendum to consult performed on 11/04/2018, Patient at increased risk for recurrent neurological event with general anesthesia.     Alexis Goodell, MD Neurology [phone] 11/05/2018  1:21 PM

## 2018-11-06 ENCOUNTER — Encounter
Admission: EM | Disposition: A | Discharge status: Skilled Nursing Facility | Payer: Self-pay | Source: Home / Self Care | Attending: Internal Medicine

## 2018-11-06 ENCOUNTER — Encounter: Payer: Self-pay | Admitting: *Deleted

## 2018-11-06 ENCOUNTER — Inpatient Hospital Stay: Payer: Medicare HMO | Admitting: Anesthesiology

## 2018-11-06 HISTORY — PX: COLONOSCOPY: SHX5424

## 2018-11-06 LAB — URINE CULTURE: Culture: >=100000 — AB

## 2018-11-06 LAB — INTRINSIC FACTOR ANTIBODIES: Intrinsic Factor: 1 AU/mL (ref 0.0–1.1)

## 2018-11-06 LAB — CBC
HCT: 25.8 % — ABNORMAL LOW (ref 39.0–52.0)
Hemoglobin: 8.4 g/dL — ABNORMAL LOW (ref 13.0–17.0)
MCH: 31 pg (ref 26.0–34.0)
MCHC: 32.6 g/dL (ref 30.0–36.0)
MCV: 95.2 fL (ref 80.0–100.0)
Platelets: 57 10*3/uL — ABNORMAL LOW (ref 150–400)
RBC: 2.71 MIL/uL — ABNORMAL LOW (ref 4.22–5.81)
RDW: 16.6 % — ABNORMAL HIGH (ref 11.5–15.5)
WBC: 1.5 10*3/uL — ABNORMAL LOW (ref 4.0–10.5)
nRBC: 0 % (ref 0.0–0.2)

## 2018-11-06 LAB — HEPATITIS PANEL, ACUTE
HCV Ab: <0.1 s/co ratio (ref 0.0–0.9)
Hep A IgM: NEGATIVE
Hep B C IgM: NEGATIVE
Hepatitis B Surface Ag: NEGATIVE

## 2018-11-06 LAB — HAPTOGLOBIN: Haptoglobin: 138 mg/dL (ref 32–363)

## 2018-11-06 SURGERY — COLONOSCOPY
Anesthesia: General

## 2018-11-06 MED ORDER — SODIUM CHLORIDE 0.9% FLUSH
3.0000 mL | Freq: Two times a day (BID) | INTRAVENOUS | Status: DC
  Administered 2018-11-06 – 2018-11-08 (×6): 3 mL via INTRAVENOUS

## 2018-11-06 MED ORDER — VITAMIN B-12 1000 MCG PO TABS: 1000.0000 ug | ORAL_TABLET | Freq: Every day | ORAL | 0 refills | Status: AC

## 2018-11-06 MED ORDER — FERROUS SULFATE 325 (65 FE) MG PO TBEC: 325.0000 mg | DELAYED_RELEASE_TABLET | Freq: Two times a day (BID) | ORAL | 0 refills | Status: AC

## 2018-11-06 MED ORDER — PROPOFOL 500 MG/50ML IV EMUL
INTRAVENOUS | Status: AC
  Filled 2018-11-06: qty 50

## 2018-11-06 MED ORDER — SODIUM CHLORIDE 0.9 % IV SOLN: INTRAVENOUS | Status: DC

## 2018-11-06 MED ORDER — LIDOCAINE HCL (CARDIAC) PF 100 MG/5ML IV SOSY
PREFILLED_SYRINGE | INTRAVENOUS | Status: DC | PRN
  Administered 2018-11-06: 60 mg via INTRATRACHEAL

## 2018-11-06 MED ORDER — LIDOCAINE HCL (PF) 2 % IJ SOLN
INTRAMUSCULAR | Status: AC
  Filled 2018-11-06: qty 10

## 2018-11-06 MED ORDER — SODIUM CHLORIDE 0.9% FLUSH: 3.0000 mL | INTRAVENOUS | Status: DC | PRN

## 2018-11-06 MED ORDER — PROPOFOL 10 MG/ML IV BOLUS
INTRAVENOUS | Status: DC | PRN
  Administered 2018-11-06: 80 mg via INTRAVENOUS

## 2018-11-06 NOTE — Transfer of Care (Signed)
Immediate Anesthesia Transfer of Care Note  Patient: Aaron Harrison  Procedure(s) Performed: COLONOSCOPY (N/A )  Patient Location: PACU and Endoscopy Unit  Anesthesia Type:General  Level of Consciousness: sedated  Airway & Oxygen Therapy: Patient Spontanous Breathing  Post-op Assessment: Report given to RN and Post -op Vital signs reviewed and stable  Post vital signs: stable  Last Vitals:  Vitals Value Taken Time  BP    Temp    Pulse    Resp    SpO2      Last Pain:  Vitals:   11/06/18 1244  TempSrc: Tympanic  PainSc:          Complications: No apparent anesthesia complications

## 2018-11-06 NOTE — Anesthesia Post-op Follow-up Note (Signed)
Anesthesia QCDR form completed.        

## 2018-11-06 NOTE — Progress Notes (Signed)
Pt returned from colonscopy with Dr. Alice Reichert in to inform of findings. Orders reviewed and updated. Pt reports tolerated procedure well; denies co's/issues; resting quietly.

## 2018-11-06 NOTE — Progress Notes (Signed)
PT Clarification Note  Patient Details Name: Aaron Harrison MRN: [id number] DOB: 04-02-1951   Clarification Note:     New evaluation order received for PT evaluation. PT evaluation was completed yesterday. Spoke with CSW who reports that PT evaluation from yesterday didn't appear to have been printed and repeat PT evaluation order was entered in error. Therapy is currently following patient and will continue with physical therapy treatments per plan of care.  Aaron Harrison PT, DPT, GCS  Aaron Harrison 11/06/2018, 3:25 PM

## 2018-11-06 NOTE — Discharge Summary (Addendum)
[facility] [place] NAME: Aaron Harrison    MR#:  [id number]  DATE OF BIRTH:  01/25/1951  DATE OF ADMISSION:  11/03/2018 ADMITTING PHYSICIAN: Sela Hua, MD  DATE OF DISCHARGE: 11/09/2018  PRIMARY CARE PHYSICIAN: Donnie Coffin, MD   ADMISSION DIAGNOSIS:  Gastrointestinal hemorrhage, unspecified gastrointestinal hemorrhage type [K92.2] Anemia with low platelet count ([place]) [D69.6]  DISCHARGE DIAGNOSIS:  Active Problems:   GI bleed   Other pancytopenia ([place])   SECONDARY DIAGNOSIS:   Past Medical History:  Diagnosis Date  . Arrhythmia   . Cellulitis   . Chronic atrial fibrillation    a. not on long term anticoagulation  . Chronic systolic CHF (congestive heart failure) ([place])   . Hypertension   . Pneumonia 2011  . Stroke [facility]) 08/2018     ADMITTING HISTORY  HISTORY OF PRESENT ILLNESS:  Aaron Harrison  is a 68 y.o. male with a known history of chronic atrial fibrillation, chronic systolic heart failure, hypertension, recent history of stroke 09/12/18 who presented to the ED from [facility] with bright red blood per rectum.  He states that for the last couple of days, his stool has been "purple, red, or pink" in color.  He takes a baby aspirin at home.  He denies any abdominal pain, chest pain, shortness of breath, palpitations, hematemesis.  He has never had a colonoscopy or endoscopy.  In the ED, vitals were unremarkable.  Labs are significant for creatinine 1.62, hemoglobin 8.1, total bili 2.6.  Hospitalists were called for admission.  HOSPITAL COURSE:   *Acute GIB.  Hemoglobin has stabilized around 8.5 at this time.  EGD showed erosions but no acute bleed.  Colonoscopy- Rectal adenocarcinoma Started on Protonix drip initially which has been stopped due to no upper GI bleed. Status post 1 unit PRBCs  Still has some occasional bleeding from rectal mass  *Acute blood loss anemia on chronic microcytic anemia Improved status  post 1 unit packed red blood cell transfusion  B12 deficiency noted on anemia work-up Given vitamin B12 1000 mg IV x1, continue vitamin B12 1000 mg daily at discharge Hematology f/u as OP  *Recent stroke11/2019-no acute focal deficits Continue aspirin per gastroenterology, statin therapy  *Chronic leukopenia/thrombocytopenia Stable  Abdominal ultrasound noted for mild splenomegaly/gallstones Hematology/oncology input appreciated Dr. Rogue Bussing. Bone marrow biopsy done on 11/09/2018. Results pending F/U OP with oncology  *Acute on chronic hyperbilirubinemia bilirubin elevated to 2.6, noted mild jaundice skin Liver ultrasound noted above, check hepatitis panel, gastroenterology f/u as OP  *CKD III Stable Near baseline  Avoid nephrotoxic agents  *Hyperlipidemia Continue home Lipitor  Klebsiella UTI Treated with Iv ceftriaxone in hospital.  Rectal mass w/u under way. F/u with Dr. Starleen Arms as OP  Discharge to SNF  CONSULTS OBTAINED:  Treatment Team:  Lin Landsman, MD Catarina Hartshorn, MD Efrain Sella, MD  DRUG ALLERGIES:   Allergies  Allergen Reactions  . Penicillins Nausea And Vomiting    Has patient had a PCN reaction causing immediate rash, facial/tongue/throat swelling, SOB or lightheadedness with hypotension: No Has patient had a PCN reaction causing severe rash involving mucus membranes or skin necrosis: No Has patient had a PCN reaction that required hospitalization: No Has patient had a PCN reaction occurring within the last 10 years: No If all of the above answers are "NO", then may proceed with Cephalosporin use.     DISCHARGE MEDICATIONS:   Allergies as of 11/06/2018      Reactions  Penicillins Nausea And Vomiting   Has patient had a PCN reaction causing immediate rash, facial/tongue/throat swelling, SOB or lightheadedness with hypotension: No Has patient had a PCN reaction causing severe rash involving mucus membranes or skin  necrosis: No Has patient had a PCN reaction that required hospitalization: No Has patient had a PCN reaction occurring within the last 10 years: No If all of the above answers are "NO", then may proceed with Cephalosporin use.      Medication List    TAKE these medications   acetaminophen 325 MG tablet Commonly known as:  TYLENOL Take 2 tablets (650 mg total) by mouth every 6 (six) hours as needed for mild pain (or Fever >/= 101).   amLODipine 5 MG tablet Commonly known as:  NORVASC Take 1 tablet (5 mg total) by mouth daily.   aspirin EC 81 MG tablet Take 81 mg by mouth daily.   atorvastatin 40 MG tablet Commonly known as:  LIPITOR Take 1 tablet (40 mg total) by mouth daily at 6 PM.   carvedilol 12.5 MG tablet Commonly known as:  COREG Take 1 tablet (12.5 mg total) by mouth 2 (two) times daily with a meal.   ferrous sulfate 325 (65 FE) MG EC tablet Take 1 tablet (325 mg total) by mouth 2 (two) times daily for 30 days.   furosemide 20 MG tablet Commonly known as:  LASIX Take 1 tablet (20 mg total) by mouth daily.   tamsulosin 0.4 MG Caps capsule Commonly known as:  FLOMAX Take 1 capsule (0.4 mg total) by mouth daily.   traMADol 50 MG tablet Commonly known as:  ULTRAM Take 1 tablet (50 mg total) by mouth every 6 (six) hours as needed for moderate pain.   vitamin B-12 1000 MCG tablet Commonly known as:  CYANOCOBALAMIN Take 1 tablet (1,000 mcg total) by mouth daily.       Today   VITAL SIGNS:  Blood pressure 133/73, pulse 74, temperature 98.6 F (37 C), temperature source Oral, resp. rate 20, height _0  (1.727 m), weight 86.3 kg, SpO2 100 %.  I/O:    Intake/Output Summary (Last 24 hours) at 11/06/2018 1214 Last data filed at 11/06/2018 1100 Gross per 24 hour  Intake 454.67 ml  Output 251 ml  Net 203.67 ml    PHYSICAL EXAMINATION:  Physical Exam  GENERAL:  68 y.o.-year-old patient lying in the bed with no acute distress.  LUNGS: Normal breath sounds  bilaterally, no wheezing, rales,rhonchi or crepitation. No use of accessory muscles of respiration.  CARDIOVASCULAR: S1, S2 normal. No murmurs, rubs, or gallops.  ABDOMEN: Soft, non-tender, non-distended. Bowel sounds present. No organomegaly or mass.  NEUROLOGIC: Moves all 4 extremities. PSYCHIATRIC: The patient is alert and oriented x 3.  SKIN: No obvious rash, lesion, or ulcer.   DATA REVIEW:   CBC Recent Labs  Lab 11/06/18 0012  WBC 1.5*  HGB 8.4*  HCT 25.8*  PLT 57*    Chemistries  Recent Labs  Lab 11/05/18 0722  NA 134*  K 3.9  CL 105  CO2 21*  GLUCOSE 107*  BUN 22  CREATININE 1.49*  CALCIUM 8.2*  AST 11*  ALT 7  ALKPHOS 70  BILITOT 3.0*    Cardiac Enzymes Recent Labs  Lab 11/03/18 1103  TROPONINI <0.03    Microbiology Results  Results for orders placed or performed during the hospital encounter of 11/03/18  Urine Culture     Status: Abnormal   Collection Time: 11/03/18 11:03 AM  Result Value Ref Range Status   Specimen Description   Final    URINE, RANDOM Performed at [facility], [address] [place], [place] [address]    Special Requests   Final    NONE Performed at [facility], [place] [address], [place] [address]    Culture >=100,000 COLONIES/mL KLEBSIELLA PNEUMONIAE (A)  Final   Report Status 11/06/2018 FINAL  Final   Organism ID, Bacteria KLEBSIELLA PNEUMONIAE (A)  Final      Susceptibility   Klebsiella pneumoniae - MIC*    AMPICILLIN RESISTANT Resistant     CEFAZOLIN <=4 SENSITIVE Sensitive     CEFTRIAXONE <=1 SENSITIVE Sensitive     CIPROFLOXACIN 1 SENSITIVE Sensitive     GENTAMICIN <=1 SENSITIVE Sensitive     IMIPENEM 0.5 SENSITIVE Sensitive     NITROFURANTOIN 64 INTERMEDIATE Intermediate     TRIMETH/SULFA <=20 SENSITIVE Sensitive     AMPICILLIN/SULBACTAM <=2 SENSITIVE Sensitive     PIP/TAZO <=4 SENSITIVE Sensitive     Extended ESBL NEGATIVE Sensitive     * >=100,000 COLONIES/mL KLEBSIELLA  PNEUMONIAE  C difficile quick scan w PCR reflex     Status: None   Collection Time: 11/03/18  5:38 PM  Result Value Ref Range Status   C Diff antigen NEGATIVE NEGATIVE Final   C Diff toxin NEGATIVE NEGATIVE Final   C Diff interpretation No C. difficile detected.  Final    Comment: Performed at [facility], [address] [place], [place] [address]  Gastrointestinal Panel by PCR , Stool     Status: None   Collection Time: 11/03/18  5:38 PM  Result Value Ref Range Status   Campylobacter species NOT DETECTED NOT DETECTED Final   Plesimonas shigelloides NOT DETECTED NOT DETECTED Final   Salmonella species NOT DETECTED NOT DETECTED Final   Yersinia enterocolitica NOT DETECTED NOT DETECTED Final   Vibrio species NOT DETECTED NOT DETECTED Final   Vibrio cholerae NOT DETECTED NOT DETECTED Final   Enteroaggregative E coli (EAEC) NOT DETECTED NOT DETECTED Final   Enteropathogenic E coli (EPEC) NOT DETECTED NOT DETECTED Final   Enterotoxigenic E coli (ETEC) NOT DETECTED NOT DETECTED Final   Shiga like toxin producing E coli (STEC) NOT DETECTED NOT DETECTED Final   Shigella/Enteroinvasive E coli (EIEC) NOT DETECTED NOT DETECTED Final   Cryptosporidium NOT DETECTED NOT DETECTED Final   Cyclospora cayetanensis NOT DETECTED NOT DETECTED Final   Entamoeba histolytica NOT DETECTED NOT DETECTED Final   Giardia lamblia NOT DETECTED NOT DETECTED Final   Adenovirus F40/41 NOT DETECTED NOT DETECTED Final   Astrovirus NOT DETECTED NOT DETECTED Final   Norovirus GI/GII NOT DETECTED NOT DETECTED Final   Rotavirus A NOT DETECTED NOT DETECTED Final   Sapovirus (I, II, IV, and V) NOT DETECTED NOT DETECTED Final    Comment: Performed at [facility], [address] [place], [place] [address]  MRSA PCR Screening     Status: None   Collection Time: 11/03/18  8:53 PM  Result Value Ref Range Status   MRSA by PCR NEGATIVE NEGATIVE Final    Comment:        The GeneXpert MRSA Assay  (FDA approved for NASAL specimens only), is one component of a comprehensive MRSA colonization surveillance program. It is not intended to diagnose MRSA infection nor to guide or monitor treatment for MRSA infections. Performed at [facility], [address], [place], [place] [address]     RADIOLOGY:  No results  found.  Follow up with PCP in 1 week.  Management plans discussed with the patient, family and they are in agreement.  CODE STATUS:     Code Status Orders  (From admission, onward)         Start     Ordered   11/03/18 1856  Do not attempt resuscitation (DNR)  Continuous    Question Answer Comment  In the event of cardiac or respiratory ARREST Do not call a "code blue"   In the event of cardiac or respiratory ARREST Do not perform Intubation, CPR, defibrillation or ACLS   In the event of cardiac or respiratory ARREST Use medication by any route, position, wound care, and other measures to relive pain and suffering. May use oxygen, suction and manual treatment of airway obstruction as needed for comfort.   Comments Pt does not want a Permanent Feeding Tube or to be on a ventilator ever again.  He is OK with dialysis, however.      11/03/18 1855        Code Status History    Date Active Date Inactive Code Status Order ID Comments User Context   09/12/2018 1509 09/15/2018 2127 DNR [id number]  Gladstone Lighter, MD ED   09/10/2018 2048 09/11/2018 1720 DNR [id number]  Demetrios Loll, MD Inpatient   09/05/2018 2014 09/06/2018 2011 DNR [id number]  Vaughan Basta, MD Inpatient   06/21/2015 1714 07/12/2015 1802 DNR [id number]  Colleen Can, MD Inpatient   06/21/2015 1309 06/21/2015 1714 DNR [id number]  Colleen Can, MD Inpatient   06/15/2015 1719 06/21/2015 1309 Partial Code [id number] Once extubated, he is NOT TO BE RE-INTUBATED Colleen Can, MD Inpatient   06/10/2015 1833 06/15/2015 1712 Full Code [id number]  Demetrios Loll, MD Inpatient    04/07/2015 2236 04/11/2015 1707 Full Code [id number]  Idelle Crouch, MD Inpatient      TOTAL TIME TAKING CARE OF THIS PATIENT ON DAY OF DISCHARGE: more than 30 minutes.   Neita Carp M.D on 11/06/2018 at 12:14 PM  Between 7am to 6pm - Pager - [phone]  After 6pm go to [email or web address] - password [email or web address] [facility]  Office  [phone]  CC: Primary care physician; Donnie Coffin, MD  Note: This dictation was prepared with Dragon dictation along with smaller phrase technology. Any transcriptional errors that result from this process are unintentional.

## 2018-11-06 NOTE — Progress Notes (Signed)
Found 1/2 jug of golytely sitting on overbed table- pt states he drank what was poured for him. Tap water enema #1 given with poor retention returned with mostly black color with flakes. Tap water enema # 2 given with poor retention with return highly colored black brown with streaks of red. No active bleeding; consider irritation from enemas. Pt agreeable and tolerated well. NPO except BP/heart meds. TC report was given to procedure nurse. Pt is incontinent of stool/urine but will alert staff when he goes. Awaiting for colonoscopy.

## 2018-11-06 NOTE — Anesthesia Postprocedure Evaluation (Signed)
Anesthesia Post Note  Patient: Aaron Harrison  Procedure(s) Performed: COLONOSCOPY (N/A )  Patient location during evaluation: Endoscopy Anesthesia Type: General Level of consciousness: awake and alert Pain management: pain level controlled Vital Signs Assessment: post-procedure vital signs reviewed and stable Respiratory status: spontaneous breathing and respiratory function stable Cardiovascular status: stable Anesthetic complications: no     Last Vitals:  Vitals:   11/06/18 1244 11/06/18 1327  BP: 122/62 (!) 87/59  Pulse: 74 74  Resp: 18 16  Temp: 37.5 C 36.8 C  SpO2: 100% 100%    Last Pain:  Vitals:   11/06/18 1327  TempSrc: Tympanic  PainSc:                  [name]

## 2018-11-06 NOTE — Interval H&P Note (Signed)
History and Physical Interval Note:  11/06/2018 1:00 PM  Aaron Harrison  has presented today for surgery, with the diagnosis of hematochezia, EGD normal, poor prep  The various methods of treatment have been discussed with the patient and family. After consideration of risks, benefits and other options for treatment, the patient has consented to  Procedure(s): COLONOSCOPY (N/A) as a surgical intervention .  The patient's history has been reviewed, patient examined, no change in status, stable for surgery.  I have reviewed the patient's chart and labs.  Questions were answered to the patient's satisfaction.     [place], [place]

## 2018-11-06 NOTE — Op Note (Signed)
[facility] Gastroenterology Patient Name: Aaron Harrison Procedure Date: 11/06/2018 12:45 PM MRN: [id number] Account #: [id number] Date of Birth: 06/26/1951 Admit Type: Inpatient Age: 68 Room: [facility] ENDO ROOM 4 Gender: Male Note Status: Finalized Procedure:            Colonoscopy Indications:          Hematochezia, Acute post hemorrhagic anemia Providers:            Benay Pike. Alice Reichert MD, MD Referring MD:         [name]. [name] MD (Referring MD) Medicines:            Propofol per Anesthesia Complications:        No immediate complications. Procedure:            Pre-Anesthesia Assessment:                       - The risks and benefits of the procedure and the                        sedation options and risks were discussed with the                        patient. All questions were answered and informed                        consent was obtained.                       - Patient identification and proposed procedure were                        verified prior to the procedure by the nurse. The                        procedure was verified in the procedure room.                       - ASA Grade Assessment: IV - A patient with severe                        systemic disease that is a constant threat to life.                       - After reviewing the risks and benefits, the patient                        was deemed in satisfactory condition to undergo the                        procedure.                       After obtaining informed consent, the colonoscope was                        passed under direct vision. Throughout the procedure,                        the patient's blood pressure, pulse, and oxygen  saturations were monitored continuously. The                        Colonoscope was introduced through the anus and                        advanced to the the cecum, identified by appendiceal                        orifice and ileocecal valve.  The colonoscopy was                        performed without difficulty. The patient tolerated the                        procedure well. The quality of the bowel preparation                        was good. The ileocecal valve, appendiceal orifice, and                        rectum were photographed. Findings:      The digital rectal exam findings include palpable rectal mass and       decreased sphincter tone.      An infiltrative and ulcerated partially obstructing large mass was found       in the distal rectum. The mass was partially circumferential (involving       two-thirds of the lumen circumference). The mass measured ten cm in       length. Oozing was present. Area was successfully marked at 10cm       proximal to the mass, injected with 4 mL Spot (carbon black) for       tattooing. Estimated blood loss was minimal.      Retroflexed exam in rectum unable to be performed due to poor rectal       distensability due to presence of distal rectal mass      The exam was otherwise without abnormality. Impression:           - Palpable rectal mass and decreased sphincter tone                        found on digital rectal exam.                       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                       - The examination was otherwise normal.                       - No specimens collected. Recommendation:       - Await pathology results.                       - Return patient to hospital ward for ongoing care.                       - Clear liquid diet. 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 All rights reserved. The codes documented in this report are preliminary and upon coder review may  be revised to meet current compliance requirements. Efrain Sella MD, MD 11/06/2018 1:32:56 PM This report has been signed electronically. Number of Addenda: 0 Note Initiated On: 11/06/2018 12:45 PM Scope Withdrawal Time: 0 hours 10 minutes 27 seconds  Total Procedure Duration: 0 hours 13 minutes 27 seconds       [facility]

## 2018-11-06 NOTE — Clinical Social Work Note (Signed)
CSW contacted [facility] to check on authorization process. The insurance provider needs updated PT recommendation. PT is pending. CSW has updated the attending MD that the patient cannot return to SNF until approved by [employer], and PT is pending. CSW is following.  Santiago Bumpers, MSW, Latanya Presser [phone]

## 2018-11-06 NOTE — Anesthesia Preprocedure Evaluation (Signed)
Anesthesia Evaluation  Patient identified by MRN, date of birth, ID band  Reviewed: Allergy & Precautions, NPO status , Patient's Chart, lab work & pertinent test results  History of Anesthesia Complications Negative for: history of anesthetic complications  Airway Mallampati: II       Dental   Pulmonary neg sleep apnea, neg COPD,           Cardiovascular hypertension, Pt. on medications +CHF (LVEF 50-55%)  + dysrhythmias (hx of afib) Atrial Fibrillation      Neuro/Psych CVA (> 60 days ago, neurologist states pt high risk.)    GI/Hepatic Neg liver ROS, neg GERD  ,  Endo/Other  neg diabetes  Renal/GU Renal InsufficiencyRenal disease     Musculoskeletal   Abdominal   Peds  Hematology   Anesthesia Other Findings   Reproductive/Obstetrics                             Anesthesia Physical  Anesthesia Plan  ASA: III and emergent  Anesthesia Plan: General   Post-op Pain Management:    Induction: Intravenous  PONV Risk Score and Plan: 2 and TIVA and Propofol infusion  Airway Management Planned: Nasal Cannula  Additional Equipment:   Intra-op Plan:   Post-operative Plan:   Informed Consent: I have reviewed the patients History and Physical, chart, labs and discussed the procedure including the risks, benefits and alternatives for the proposed anesthesia with the patient or authorized representative who has indicated his/her understanding and acceptance.     Plan Discussed with:   Anesthesia Plan Comments: (Pt is high risk for repeat CVA, and cardiovascular events. Pt and surgeon understand and desire to proceed. )        Anesthesia Quick Evaluation

## 2018-11-06 NOTE — Clinical Social Work Note (Addendum)
CSW received a consult that patient is receiving PT at [facility] and to assess for other needs. The patient plans to return to the facility once authorized by his insurance provided. No additional assessment for needs is required. CSW is waiting for PT note in order to send to insurance company for authorization. CSW is already following.  UPDATE: CSW received update that PT note in as of 1/10. CSW has faxed clinicals to [facility]. Auth pending. CSW will update as more information available.  Santiago Bumpers, MSW, Latanya Presser [phone]

## 2018-11-06 NOTE — Progress Notes (Signed)
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 Black stools No abd pain  REVIEW OF SYSTEMS:  CONSTITUTIONAL: No fever, fatigue or weakness.  EYES: No blurred or double vision.  EARS, NOSE, AND THROAT: No tinnitus or ear pain.  RESPIRATORY: No cough, shortness of breath, wheezing or hemoptysis.  CARDIOVASCULAR: No chest pain, orthopnea, edema.  GASTROINTESTINAL: No nausea, vomiting, diarrhea or abdominal pain.  GENITOURINARY: No dysuria, hematuria.  ENDOCRINE: No polyuria, nocturia,  HEMATOLOGY: No anemia, easy bruising or bleeding SKIN: No rash or lesion. MUSCULOSKELETAL: No joint pain or arthritis.   NEUROLOGIC: No tingling, numbness, weakness.  PSYCHIATRY: No anxiety or depression.   ROS  DRUG ALLERGIES:   Allergies  Allergen Reactions  . Penicillins Nausea And Vomiting    Has patient had a PCN reaction causing immediate rash, facial/tongue/throat swelling, SOB or lightheadedness with hypotension: No Has patient had a PCN reaction causing severe rash involving mucus membranes or skin necrosis: No Has patient had a PCN reaction that required hospitalization: No Has patient had a PCN reaction occurring within the last 10 years: No If all of the above answers are "NO", then may proceed with Cephalosporin use.     VITALS:  Blood pressure (!) 127/56, pulse 73, temperature 98.7 F (37.1 C), temperature source Oral, resp. rate 20, height 5\' 8"  (1.727 m), weight 86.3 kg, SpO2 100 %.  PHYSICAL EXAMINATION:  GENERAL:  68 y.o.-year-old patient lying in the bed with no acute distress.  EYES: Pupils equal, round, reactive to light and accommodation. No scleral icterus. Extraocular muscles intact.  HEENT: Head atraumatic, normocephalic. Oropharynx and nasopharynx clear.  NECK:  Supple, no jugular venous  distention. No thyroid enlargement, no tenderness.  LUNGS: Normal breath sounds bilaterally, no wheezing, rales,rhonchi or crepitation. No use of accessory muscles of respiration.  CARDIOVASCULAR: S1, S2 normal. No murmurs, rubs, or gallops.  ABDOMEN: Soft, nontender, nondistended. Bowel sounds present. No organomegaly or mass.  EXTREMITIES: No pedal edema, cyanosis, or clubbing.  NEUROLOGIC: Cranial nerves II through XII are intact. Muscle strength 5/5 in all extremities. Sensation intact. Gait not checked.  PSYCHIATRIC: The patient is alert and oriented x 3.  SKIN: Mild jaundice appearance    Physical Exam LABORATORY PANEL:   CBC Recent Labs  Lab 11/06/18 0012  WBC 1.5*  HGB 8.4*  HCT 25.8*  PLT 57*   ------------------------------------------------------------------------------------------------------------------  Chemistries  Recent Labs  Lab 11/05/18 0722  NA 134*  K 3.9  CL 105  CO2 21*  GLUCOSE 107*  BUN 22  CREATININE 1.49*  CALCIUM 8.2*  AST 11*  ALT 7  ALKPHOS 70  BILITOT 3.0*   ------------------------------------------------------------------------------------------------------------------  Cardiac Enzymes Recent Labs  Lab 11/03/18 1103  TROPONINI <0.03   ------------------------------------------------------------------------------------------------------------------  RADIOLOGY:  No results found.  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 Biopsy results pending Status post 1 unit PRBCs  * Klebsiella UTI Empiric Rocephin for 3-day course, follow-up on cultures  *Acute blood loss anemia on chronic microcytic anemia Improved status post 1 unit packed red blood cell transfusion  B12 deficiency noted on anemia work-up Given vitamin B12 1000 mg IV x1, continue vitamin B12 1000 mg daily, CBC daily, and transfuse as needed   *Recent stroke 08/2018- no acute focal  deficits Continue aspirin per  gastroenterology, statin therapy  *Chronic leukopenia/thrombocytopenia Stable  Suspected due to chronic liver disease, noted mildly jaundiced skin,  AST/ALT normal Abdominal ultrasound noted for mild splenomegaly/gallstones Hematology/oncology input appreciated-recommend outpatient bone biopsy for further evaluation status post discharge by Dr. 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 Management plans discussed with the patient, family and they are in agreement.  CODE STATUS: DNR  TOTAL TIME TAKING CARE OF THIS PATIENT: 35 minutes.   POSSIBLE D/C IN 1-2 DAYS, DEPENDING ON CLINICAL CONDITION.   Neita Carp M.D on 11/06/2018   Between 7am to 6pm - Pager - [phone]  After 6pm go to [email or web address] - password [email or web address] [place] [facility]  Office  [phone]  CC: Primary care physician; Donnie Coffin, MD  Note: This dictation was prepared with Dragon dictation along with smaller phrase technology. Any transcriptional errors that result from this process are unintentional.

## 2018-11-07 LAB — COMPREHENSIVE METABOLIC PANEL
ALT: 7 U/L (ref 0–44)
AST: 11 U/L — ABNORMAL LOW (ref 15–41)
Albumin: 2.8 g/dL — ABNORMAL LOW (ref 3.5–5.0)
Alkaline Phosphatase: 69 U/L (ref 38–126)
Anion gap: 6 (ref 5–15)
BUN: 16 mg/dL (ref 8–23)
CO2: 21 mmol/L — ABNORMAL LOW (ref 22–32)
Calcium: 8.1 mg/dL — ABNORMAL LOW (ref 8.9–10.3)
Chloride: 109 mmol/L (ref 98–111)
Creatinine, Ser: 1.39 mg/dL — ABNORMAL HIGH (ref 0.61–1.24)
GFR calc Af Amer: >60 mL/min (ref >60)
GFR, EST NON AFRICAN AMERICAN: 53 mL/min — AB (ref >60)
Glucose, Bld: 118 mg/dL — ABNORMAL HIGH (ref 70–99)
Potassium: 3.6 mmol/L (ref 3.5–5.1)
Sodium: 136 mmol/L (ref 135–145)
Total Bilirubin: 1.6 mg/dL — ABNORMAL HIGH (ref 0.3–1.2)
Total Protein: 5.8 g/dL — ABNORMAL LOW (ref 6.5–8.1)

## 2018-11-07 LAB — CBC WITH DIFFERENTIAL/PLATELET
Abs Immature Granulocytes: 0.01 10*3/uL (ref 0.00–0.07)
Basophils Absolute: 0 10*3/uL (ref 0.0–0.1)
Basophils Relative: 0 %
EOS PCT: 9 %
Eosinophils Absolute: 0.1 10*3/uL (ref 0.0–0.5)
HCT: 23.5 % — ABNORMAL LOW (ref 39.0–52.0)
Hemoglobin: 7.7 g/dL — ABNORMAL LOW (ref 13.0–17.0)
Immature Granulocytes: 1 %
Lymphocytes Relative: 26 %
Lymphs Abs: 0.3 10*3/uL — ABNORMAL LOW (ref 0.7–4.0)
MCH: 30.7 pg (ref 26.0–34.0)
MCHC: 32.8 g/dL (ref 30.0–36.0)
MCV: 93.6 fL (ref 80.0–100.0)
MONOS PCT: 8 %
Monocytes Absolute: 0.1 10*3/uL (ref 0.1–1.0)
Neutro Abs: 0.6 10*3/uL — ABNORMAL LOW (ref 1.7–7.7)
Neutrophils Relative %: 56 %
Platelets: 56 10*3/uL — ABNORMAL LOW (ref 150–400)
RBC: 2.51 MIL/uL — ABNORMAL LOW (ref 4.22–5.81)
RDW: 16.2 % — ABNORMAL HIGH (ref 11.5–15.5)
WBC: 1.1 10*3/uL — CL (ref 4.0–10.5)
nRBC: 0 % (ref 0.0–0.2)

## 2018-11-07 LAB — ANTI-PARIETAL ANTIBODY: Parietal Cell Antibody-IgG: 1.3 Units (ref 0.0–20.0)

## 2018-11-07 MED ORDER — SENNOSIDES-DOCUSATE SODIUM 8.6-50 MG PO TABS
2.0000 | ORAL_TABLET | Freq: Two times a day (BID) | ORAL | Status: DC
  Administered 2018-11-07 – 2018-11-09 (×5): 2 via ORAL
  Filled 2018-11-07 (×5): qty 2

## 2018-11-07 MED ORDER — FUROSEMIDE 40 MG PO TABS
40.0000 mg | ORAL_TABLET | Freq: Every day | ORAL | Status: DC
  Administered 2018-11-07 – 2018-11-09 (×3): 40 mg via ORAL
  Filled 2018-11-07 (×3): qty 1

## 2018-11-07 NOTE — Accreditation Note (Signed)
Patient seen lying on his bed, no complaints, alert and oriented x4. Patient had a bowel movement, bloody with mucous noted. Perineal care rendered. A critical lab of WBC 1.1 notification received from lab, notified MD Marcille Blanco, no new orders made. Patient kept safe and comfortable. Will continue to monitor.

## 2018-11-07 NOTE — Progress Notes (Signed)
[facility] [place] NAME: Aaron Harrison    MR#:  [id number]  DATE OF BIRTH:  1951-02-06  SUBJECTIVE:   Had EGD 11/05/2018 Colonoscopy 11/06/2017 showed mass likely adenocarcinoma. 2 episodes of blood in stool overnight No pain  REVIEW OF SYSTEMS:  CONSTITUTIONAL: No fever, fatigue or weakness.  EYES: No blurred or double vision.  EARS, NOSE, AND THROAT: No tinnitus or ear pain.  RESPIRATORY: No cough, shortness of breath, wheezing or hemoptysis.  CARDIOVASCULAR: No chest pain, orthopnea, edema.  GASTROINTESTINAL: No nausea, vomiting, diarrhea or abdominal pain.  GENITOURINARY: No dysuria, hematuria.  ENDOCRINE: No polyuria, nocturia,  HEMATOLOGY: No anemia, easy bruising or bleeding SKIN: No rash or lesion. MUSCULOSKELETAL: No joint pain or arthritis.   NEUROLOGIC: No tingling, numbness, weakness.  PSYCHIATRY: No anxiety or depression.   ROS  DRUG ALLERGIES:   Allergies  Allergen Reactions  . Penicillins Nausea And Vomiting    Has patient had a PCN reaction causing immediate rash, facial/tongue/throat swelling, SOB or lightheadedness with hypotension: No Has patient had a PCN reaction causing severe rash involving mucus membranes or skin necrosis: No Has patient had a PCN reaction that required hospitalization: No Has patient had a PCN reaction occurring within the last 10 years: No If all of the above answers are "NO", then may proceed with Cephalosporin use.     VITALS:  Blood pressure 136/63, pulse 89, temperature 98.4 F (36.9 C), temperature source Oral, resp. rate 20, height '5\' 8"'$  (1.727 m), weight 86.3 kg, SpO2 96 %.  PHYSICAL EXAMINATION:  GENERAL:  68 y.o.-year-old patient lying in the bed with no acute distress.  EYES: Pupils equal, round, reactive to light and accommodation. No scleral icterus. Extraocular muscles intact.  HEENT: Head atraumatic, normocephalic. Oropharynx and nasopharynx clear.  NECK:  Supple, no  jugular venous distention. No thyroid enlargement, no tenderness.  LUNGS: Normal breath sounds bilaterally, no wheezing, rales,rhonchi or crepitation. No use of accessory muscles of respiration.  CARDIOVASCULAR: S1, S2 normal. No murmurs, rubs, or gallops.  ABDOMEN: Soft, nontender, nondistended. Bowel sounds present. No organomegaly or mass.  EXTREMITIES: No pedal edema, cyanosis, or clubbing.  NEUROLOGIC: Cranial nerves II through XII are intact. Muscle strength 5/5 in all extremities. Sensation intact. Gait not checked.  PSYCHIATRIC: The patient is alert and oriented x 3.  SKIN: Mild jaundice appearance    Physical Exam LABORATORY PANEL:   CBC Recent Labs  Lab 11/07/18 0437  WBC 1.1*  HGB 7.7*  HCT 23.5*  PLT 56*   ------------------------------------------------------------------------------------------------------------------  Chemistries  Recent Labs  Lab 11/07/18 0437  NA 136  K 3.6  CL 109  CO2 21*  GLUCOSE 118*  BUN 16  CREATININE 1.39*  CALCIUM 8.1*  AST 11*  ALT 7  ALKPHOS 69  BILITOT 1.6*   ------------------------------------------------------------------------------------------------------------------  Cardiac Enzymes Recent Labs  Lab 11/03/18 1103  TROPONINI <0.03   ------------------------------------------------------------------------------------------------------------------  RADIOLOGY:  No results found.  ASSESSMENT AND PLAN:  *Acute GIB EGD- Erosions and no bleeding Colonoscopy- Rectal mass.  Suspicious for adenocarcinoma. Biopsy results pending Status post 1 unit PRBCs  *Rectal mass.  GI and oncology on board. biopsy result pending  * Klebsiella UTI Empiric Rocephin for 3-day course  stopped today  *Acute blood loss anemia on chronic microcytic anemia-worsening today Improved status post 1 unit packed red blood cell transfusion  B12 deficiency noted on anemia work-up Given vitamin B12 1000 mg IV x1, continue vitamin B12 1000 mg  daily, CBC  daily, and transfuse as needed   *Recent stroke 08/2018- no acute focal deficits Continue aspirin per gastroenterology, statin therapy  *Chronic leukopenia/thrombocytopenia Stable  Bone marrow biopsy as outpatient per oncology Abdominal ultrasound noted for mild splenomegaly/gallstones Hematology/oncology input appreciated-recommend outpatient bone biopsy for further evaluation status post discharge by Dr. 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 Management plans discussed with the patient, family and they are in agreement.  CODE STATUS: DNR  TOTAL TIME TAKING CARE OF THIS PATIENT: 35 minutes.   POSSIBLE D/C IN 1-2 DAYS, DEPENDING ON CLINICAL CONDITION.  Neita Carp M.D on 11/07/2018   Between 7am to 6pm - Pager - [phone]  After 6pm go to [email or web address] - password [email or web address] [facility]  Office  [phone]  CC: Primary care physician; Donnie Coffin, MD  Note: This dictation was prepared with Dragon dictation along with smaller phrase technology. Any transcriptional errors that result from this process are unintentional.

## 2018-11-07 NOTE — Progress Notes (Deleted)
Pt admitted from ED coming from home with husband with intractable nausea and vomiting. IVF's started with K+ IV supplements started. Protonix drip infusing. Pt has vomited x 2 but family has emptied emesis bags with RN not seeing emesis. EGD consent signed. Pt resting quietly with family at bedside. VSS. No sign of active bleeding at this time. Zofran IV to given on schedule with partial effectiveness.

## 2018-11-07 NOTE — Clinical Social Work Note (Signed)
CSW received the following consult: Please contact HPOA [name] at [phone]-during the day during the week and personal cell phone [phone]. Needs to discuss discharge plans, resources, cost of care. Trying to anticipate level of care/placement with continued need for post stroke rehab with new diagnosis of rectal mass-concerning for malignancy.  CSW will have the weekday CSW contact the [facility]. CSW is following.  Santiago Bumpers, MSW, Latanya Presser [phone]

## 2018-11-07 NOTE — Progress Notes (Signed)
Color pale, generalized weakness; denies pain, n/v. Pt has approximately 2 oz of red bloody stool in bedpan x 1. Tolerated regular diet. HPOA per pt here with pt/HPOA updated on care plan with questions answered to their satisfaction. SW referral submitted due to complicated situation with pt on short term rehab related to recent stroke and is now unable to live alone independently with financial issues.

## 2018-11-08 ENCOUNTER — Encounter: Payer: Self-pay | Admitting: Internal Medicine

## 2018-11-08 ENCOUNTER — Telehealth: Payer: Self-pay | Admitting: Internal Medicine

## 2018-11-08 LAB — CBC WITH DIFFERENTIAL/PLATELET
Abs Immature Granulocytes: 0.01 10*3/uL (ref 0.00–0.07)
Basophils Absolute: 0 10*3/uL (ref 0.0–0.1)
Basophils Relative: 0 %
Eosinophils Absolute: 0.1 10*3/uL (ref 0.0–0.5)
Eosinophils Relative: 10 %
HCT: 23.5 % — ABNORMAL LOW (ref 39.0–52.0)
Hemoglobin: 7.8 g/dL — ABNORMAL LOW (ref 13.0–17.0)
Immature Granulocytes: 1 %
LYMPHS PCT: 29 %
Lymphs Abs: 0.4 10*3/uL — ABNORMAL LOW (ref 0.7–4.0)
MCH: 31.2 pg (ref 26.0–34.0)
MCHC: 33.2 g/dL (ref 30.0–36.0)
MCV: 94 fL (ref 80.0–100.0)
Monocytes Absolute: 0.1 10*3/uL (ref 0.1–1.0)
Monocytes Relative: 6 %
Neutro Abs: 0.8 10*3/uL — ABNORMAL LOW (ref 1.7–7.7)
Neutrophils Relative %: 54 %
Platelets: 55 10*3/uL — ABNORMAL LOW (ref 150–400)
RBC: 2.5 MIL/uL — AB (ref 4.22–5.81)
RDW: 16 % — ABNORMAL HIGH (ref 11.5–15.5)
Smear Review: NORMAL
WBC: 1.4 10*3/uL — CL (ref 4.0–10.5)
nRBC: 0 % (ref 0.0–0.2)

## 2018-11-08 LAB — KAPPA/LAMBDA LIGHT CHAINS
Kappa free light chain: 63.3 mg/L — ABNORMAL HIGH (ref 3.3–19.4)
Kappa, lambda light chain ratio: 0.56 (ref 0.26–1.65)
Lambda free light chains: 114 mg/L — ABNORMAL HIGH (ref 5.7–26.3)

## 2018-11-08 MED ORDER — SODIUM CHLORIDE 0.9 % IV SOLN
INTRAVENOUS | Status: DC
  Administered 2018-11-09: 08:00:00 via INTRAVENOUS

## 2018-11-08 MED ORDER — SODIUM CHLORIDE 0.9 % IV SOLN
200.0000 mg | INTRAVENOUS | Status: DC
  Administered 2018-11-08: 200 mg via INTRAVENOUS
  Filled 2018-11-08 (×2): qty 10

## 2018-11-08 NOTE — Progress Notes (Signed)
Physical Therapy Treatment Patient Details Name: Aaron Harrison MRN: [id number] DOB: 1951-08-03 Today's Date: 11/08/2018    History of Present Illness Patient is a 68 year old male admitted from [facility] with GI bleed. PMH to include Afib, HF, HTN, and CVA on 09/12/18. Patient also had recent L2 compression fracture sustained during fall with CVA.     PT Comments    Patient agrees to PT treatment after he received lunch. Patient requires min guard assist with bed mobility. Patient requires mod assist with sit to stand and transfer to chair due to poor balance in standing, posterior/right lateral leaning in standing. Lifting walked off the floor on the left side. Staggering/scissor type gait . Patient will benefit from skilled PT to address his functional limitations, weakness, poor balance for improved functional independence and safety.         Follow Up Recommendations  SNF;Supervision/Assistance - 24 hour     Equipment Recommendations  None recommended by PT    Recommendations for Other Services       Precautions / Restrictions Precautions Precautions: Fall Restrictions Weight Bearing Restrictions: No    Mobility  Bed Mobility Overal bed mobility: Modified Independent Bed Mobility: Supine to Sit     Supine to sit: Min assist Sit to supine: Mod assist   General bed mobility comments: patient able to perform supine to sit with min guard and bed rails.    Transfers Overall transfer level: Needs assistance Equipment used: Rolling walker (2 wheeled) Transfers: Sit to/from Stand Sit to Stand: Mod assist         General transfer comment: patient with posterior/right lean in standing  Ambulation/Gait Ambulation/Gait assistance: Mod assist;+2 physical assistance Gait Distance (Feet): 5 Feet Assistive device: Rolling walker (2 wheeled) Gait Pattern/deviations: Step-to pattern;Scissoring;Leaning posteriorly Gait velocity: decreased   General Gait Details:  leaning to right and posteriorly with standing activities. Raising walker off the floor on the left side.    Stairs             Wheelchair Mobility    Modified Rankin (Stroke Patients Only)       Balance Overall balance assessment: Needs assistance Sitting-balance support: Bilateral upper extremity supported;Feet supported Sitting balance-Leahy Scale: Fair   Postural control: Right lateral lean;Posterior lean Standing balance support: Bilateral upper extremity supported Standing balance-Leahy Scale: Fair Standing balance comment: requires assistance with standing balance                            Cognition Arousal/Alertness: Awake/alert Behavior During Therapy: WFL for tasks assessed/performed Overall Cognitive Status: No family/caregiver present to determine baseline cognitive functioning                                 General Comments: patient repetitive with thoughts./speech       Exercises Other Exercises Other Exercises: supine slr, heel slides, seated LAQ, x 10 reps each bilaterally. STS x3 reps    General Comments        Pertinent Vitals/Pain Pain Assessment: No/denies pain    Home Living Family/patient expects to be discharged to:: Skilled nursing facility               Additional Comments: reports he lives at [facility]    Prior Function Level of Independence: Needs assistance  Gait / Transfers Assistance Needed: reports he walks with walker there and staff, with wheelchair  to follow.  ADL's / Homemaking Assistance Needed: requires assistance     PT Goals (current goals can now be found in the care plan section) Acute Rehab PT Goals Patient Stated Goal: to return to [facility] to continue his PT and OT PT Goal Formulation: With patient Time For Goal Achievement: 11/19/18 Potential to Achieve Goals: Good    Frequency    Min 2X/week      PT Plan      Co-evaluation               AM-PAC PT "6 Clicks" Mobility   Outcome Measure  Help needed turning from your back to your side while in a flat bed without using bedrails?: A Little Help needed moving from lying on your back to sitting on the side of a flat bed without using bedrails?: A Little Help needed moving to and from a bed to a chair (including a wheelchair)?: A Lot Help needed standing up from a chair using your arms (e.g., wheelchair or bedside chair)?: A Lot Help needed to walk in hospital room?: A Lot Help needed climbing 3-5 steps with a railing? : Total 6 Click Score: 13    End of Session Equipment Utilized During Treatment: Gait belt Activity Tolerance: Patient limited by fatigue Patient left: in chair;with call bell/phone within reach;with chair alarm set Nurse Communication: Mobility status PT Visit Diagnosis: Unsteadiness on feet (R26.81);Muscle weakness (generalized) (M62.81);History of falling (Z91.81);Difficulty in walking, not elsewhere classified (R26.2);Hemiplegia and hemiparesis Hemiplegia - Right/Left: Left Hemiplegia - dominant/non-dominant: Non-dominant Hemiplegia - caused by: Cerebral infarction     Time: 1430-1445 PT Time Calculation (min) (ACUTE ONLY): 15 min  Charges:  $Therapeutic Exercise: 8-22 mins                     [employer], PT, GCS 11/08/18,3:21 PM

## 2018-11-08 NOTE — Telephone Encounter (Signed)
Long discussion with patient's healthcare power of attorney Mr. [name]. please see progress note from today.

## 2018-11-08 NOTE — Progress Notes (Signed)
   11/08/18 0100  Clinical Encounter Type  Visited With Patient  Visit Type Initial;Spiritual support  Referral From Physician  Spiritual Encounters  Spiritual Needs Prayer;Grief support;Other (Comment)

## 2018-11-08 NOTE — Progress Notes (Signed)
Aaron Harrison   DOB:1950-11-14   PY#:[id number]    Subjective: Patient had 1 bowel movement yesterday.  Noted to have some blood in it.  Overall significantly improved with no heavy rectal bleeding.  Patient denies any pain.  Denies any chest pain.  Shortness of breath on exertion.  Interested in being discharged to [place].  Objective:  Vitals:   11/08/18 0846 11/08/18 1630  BP: 117/61 104/62  Pulse: 75 78  Resp:    Temp:  98.1 F (36.7 C)  SpO2: 98% 100%     Intake/Output Summary (Last 24 hours) at 11/08/2018 1711 Last data filed at 11/08/2018 0951 Gross per 24 hour  Intake 240 ml  Output 275 ml  Net -35 ml    Physical Exam  Constitutional: He is oriented to person, place, and time and well-developed, well-nourished, and in no distress.  HENT:  Head: Normocephalic and atraumatic.  Mouth/Throat: Oropharynx is clear and moist. No oropharyngeal exudate.  Eyes: Pupils are equal, round, and reactive to light.  Neck: Normal range of motion. Neck supple.  Cardiovascular: Normal rate and regular rhythm.  Pulmonary/Chest: No respiratory distress. He has no wheezes.  Decreased breath sounds bilaterally.  Abdominal: Soft. Bowel sounds are normal. He exhibits no distension and no mass. There is no abdominal tenderness. There is no rebound and no guarding.  Musculoskeletal: Normal range of motion.        General: No tenderness or edema.  Neurological: He is alert and oriented to person, place, and time.  Skin: Skin is warm.  Chronic ecchymosis.  Psychiatric: Affect normal.     Labs:  Lab Results  Component Value Date   WBC 1.4 (LL) 11/08/2018   HGB 7.8 (L) 11/08/2018   HCT 23.5 (L) 11/08/2018   MCV 94.0 11/08/2018   PLT 55 (L) 11/08/2018   NEUTROABS 0.8 (L) 11/08/2018    Lab Results  Component Value Date   NA 136 11/07/2018   K 3.6 11/07/2018   CL 109 11/07/2018   CO2 21 (L) 11/07/2018    Studies:  No results found.  Other pancytopenia (Aaron Harrison) #68 year old  male patient with a history of stroke/congestive heart failure and CKD/chronic anemia-intermittent thrombocytopenia/neutropenia is currently admitted to hospital for rectal bleeding.  # Distal rectal mass/partially obstructing- awaiting pathology. Recommend rectal MRI to evaluate the local extent of the disease. Will also need out pt PET scan.  Discussed with Dr. Grandville Harrison concerns for any imminent obstruction.  We will also make radiation oncology referral-given the bleeding.  # Chronic mild to moderate thrombocytopenia [[id number]]; and a fairly new onset of neutropenia ANC 1.1. ? Etiology. Recommend bone marrow biopsy for further evaluation.  Discussed with Dr. Kathlene Harrison; will plan on the 14th.  # CKD-stage III- question etiology. Stable.   # Acute GI lower GI bleed-hemoglobin 7.7; recommend IV Iron.   #Discussed with the patient at length regarding the significant complexity of his care-likely diagnosis of rectal cancer [need for major surgery; chemoradiation] in the context of his pancytopenia/renal dysfunction; recent stroke etc.  As per the patient's preference I will reach out to his healthcare power of attorney with an update.  Patient has no living family [parents deceased; never married; no children; no siblings].   #Disposition: Patient wants to go back to [facility] at discharge.  Discussed with Dr. Darvin Harrison.  # 40 minutes face-to-face with the patient discussing the above plan of care; more than 50% of time spent on prognosis/ natural history; counseling and coordination.  Addendum:  Long discussion with patient's healthcare power of attorney Aaron Harrison feels patient unfortunately has poor insight into his clinical condition.  He understands the multiple comorbidities/and the seriousness of his new diagnosis of potential rectal cancer.  Discussed the treatment options at length.  As per Aaron Harrison-patient has limited resources.  He has financial constraints with transportation  from the [employer].  Patient has applied for Medicaid; awaiting response on January 16th.  If he is approved [most likely]; patient will have better financial situation.  Aaron Sickle, MD 11/08/2018  5:11 PM

## 2018-11-08 NOTE — Progress Notes (Signed)
PT Cancellation Note  Patient Details Name: Aaron Harrison MRN: [id number] DOB: 1951-02-05   Cancelled Treatment:    Reason Eval/Treat Not Completed: Patient declined, no reason specified- patient agrivated about the fact that he did not get lunch. Generally unhappy about things here. RN aware. Checking into diet order. WIll re-attempt if time allows.       Doni Bacha 11/08/2018, 1:38 PM

## 2018-11-08 NOTE — Progress Notes (Signed)
[facility] [place] NAME: Aaron Harrison    MR#:  [id number]  DATE OF BIRTH:  [birth year]  SUBJECTIVE:   Had EGD 11/05/2018 Colonoscopy 11/06/2017 showed mass likely adenocarcinoma. Blood when wiped after bowel movement.  REVIEW OF SYSTEMS:  CONSTITUTIONAL: No fever, fatigue or weakness.  EYES: No blurred or double vision.  EARS, NOSE, AND THROAT: No tinnitus or ear pain.  RESPIRATORY: No cough, shortness of breath, wheezing or hemoptysis.  CARDIOVASCULAR: No chest pain, orthopnea, edema.  GASTROINTESTINAL: No nausea, vomiting, diarrhea or abdominal pain.  GENITOURINARY: No dysuria, hematuria.  ENDOCRINE: No polyuria, nocturia,  HEMATOLOGY: No anemia, easy bruising or bleeding SKIN: No rash or lesion. MUSCULOSKELETAL: No joint pain or arthritis.   NEUROLOGIC: No tingling, numbness, weakness.  PSYCHIATRY: No anxiety or depression.   ROS  DRUG ALLERGIES:   Allergies  Allergen Reactions  . Penicillins Nausea And Vomiting    Has patient had a PCN reaction causing immediate rash, facial/tongue/throat swelling, SOB or lightheadedness with hypotension: No Has patient had a PCN reaction causing severe rash involving mucus membranes or skin necrosis: No Has patient had a PCN reaction that required hospitalization: No Has patient had a PCN reaction occurring within the last 10 years: No If all of the above answers are "NO", then may proceed with Cephalosporin use.     VITALS:  Blood pressure 117/61, pulse 75, temperature 98.2 F (36.8 C), temperature source Oral, resp. rate 17, height '5\' 8"'$  (1.727 m), weight 86.3 kg, SpO2 98 %.  PHYSICAL EXAMINATION:  GENERAL:  68 y.o.-year-old patient lying in the bed with no acute distress.  EYES: Pupils equal, round, reactive to light and accommodation. No scleral icterus. Extraocular muscles intact.  HEENT: Head atraumatic, normocephalic. Oropharynx and nasopharynx clear.  NECK:  Supple, no jugular  venous distention. No thyroid enlargement, no tenderness.  LUNGS: Normal breath sounds bilaterally, no wheezing, rales,rhonchi or crepitation. No use of accessory muscles of respiration.  CARDIOVASCULAR: S1, S2 normal. No murmurs, rubs, or gallops.  ABDOMEN: Soft, nontender, nondistended. Bowel sounds present. No organomegaly or mass.  EXTREMITIES: No pedal edema, cyanosis, or clubbing.  NEUROLOGIC: Cranial nerves II through XII are intact. Muscle strength 5/5 in all extremities. Sensation intact. Gait not checked.  PSYCHIATRIC: The patient is alert and oriented x 3.  SKIN: Mild jaundice appearance    Physical Exam LABORATORY PANEL:   CBC Recent Labs  Lab 11/08/18 0513  WBC 1.4*  HGB 7.8*  HCT 23.5*  PLT 55*   ------------------------------------------------------------------------------------------------------------------  Chemistries  Recent Labs  Lab 11/07/18 0437  NA 136  K 3.6  CL 109  CO2 21*  GLUCOSE 118*  BUN 16  CREATININE 1.39*  CALCIUM 8.1*  AST 11*  ALT 7  ALKPHOS 69  BILITOT 1.6*   ------------------------------------------------------------------------------------------------------------------  Cardiac Enzymes Recent Labs  Lab 11/03/18 1103  TROPONINI <0.03   ------------------------------------------------------------------------------------------------------------------  RADIOLOGY:  No results found.  ASSESSMENT AND PLAN:  *Acute GIB EGD- Erosions and no bleeding Colonoscopy- Rectal mass.  Suspicious for adenocarcinoma. Biopsy results pending Status post 1 unit PRBCs  *Rectal mass.  GI and oncology on board. biopsy result pending. MRI of the abdomen and pelvis today for further work-up.  * Klebsiella UTI Empiric Rocephin for 3-day course  stopped today  *Acute blood loss anemia on chronic microcytic anemia-worsening today Improved status post 1 unit packed red blood cell transfusion  B12 deficiency noted on anemia work-up Given  vitamin B12 1000 mg IV x1,  continue vitamin B12 1000 mg daily, CBC daily, and transfuse as needed   *Recent stroke 08/2018- no acute focal deficits Continue aspirin per gastroenterology, statin therapy  *Chronic leukopenia/thrombocytopenia Stable  Bone marrow biopsy as outpatient per oncology Abdominal ultrasound noted for mild splenomegaly/gallstones Hematology/oncology input appreciated- by Dr. Rogue Bussing Bone marrow biopsy tomorrow  *Acute on chronic mild hyperbilirubinemia US showed no cirrhosis. No abd pain. GI f/u as OP  *CKD III Near baseline  Avoid nephrotoxic agents  *Hyperlipidemia Continue home Lipitor  DVT prophylaxis-SCDs  Physical therapy-recommending skilled nursing facility status post discharge  All the records are reviewed and case discussed with Care Management/Social Workerr. Management plans discussed with the patient, family and they are in agreement.  CODE STATUS: DNR  TOTAL TIME TAKING CARE OF THIS PATIENT: 35 minutes.   POSSIBLE D/C IN 1-2 DAYS, DEPENDING ON CLINICAL CONDITION.  Neita Carp M.D on 11/08/2018   Between 7am to 6pm - Pager - [phone]  After 6pm go to [email or web address] - password [email or web address] [facility]  Office  [phone]  CC: Primary care physician; Donnie Coffin, MD  Note: This dictation was prepared with Dragon dictation along with smaller phrase technology. Any transcriptional errors that result from this process are unintentional.

## 2018-11-08 NOTE — Transfer of Care (Signed)
Immediate Anesthesia Transfer of Care Note  Patient: Aaron Harrison  Procedure(s) Performed: ESOPHAGOGASTRODUODENOSCOPY (EGD) (N/A ) COLONOSCOPY (N/A )  Patient Location: PACU  Anesthesia Type:General  Level of Consciousness: awake and oriented  Airway & Oxygen Therapy: Patient Spontanous Breathing and Patient connected to nasal cannula oxygen  Post-op Assessment: Report given to RN and Post -op Vital signs reviewed and stable  Post vital signs: Reviewed and stable  Last Vitals:  Vitals Value Taken Time  BP 117/61 11/08/2018  8:46 AM  Temp    Pulse 75 11/08/2018  8:46 AM  Resp    SpO2 98 % 11/08/2018  8:46 AM    Last Pain:  Vitals:   11/08/18 0846  TempSrc:   PainSc: 0-No pain         Complications: No apparent anesthesia complications

## 2018-11-08 NOTE — Care Management Important Message (Signed)
Important Message  Patient Details  Name: Aaron Harrison MRN: [id number] Date of Birth: 1951-01-12   Medicare Important Message Given:  Yes    Juliann Pulse A Simran Bomkamp 11/08/2018, 11:44 AM

## 2018-11-09 ENCOUNTER — Other Ambulatory Visit: Payer: Self-pay | Admitting: Pathology

## 2018-11-09 ENCOUNTER — Inpatient Hospital Stay: Payer: Medicare HMO

## 2018-11-09 ENCOUNTER — Other Ambulatory Visit (HOSPITAL_COMMUNITY)
Admission: RE | Admit: 2018-11-09 | Disposition: A | Discharge status: Home / Self Care | Payer: Medicare HMO | Source: Ambulatory Visit | Attending: Internal Medicine | Admitting: Internal Medicine

## 2018-11-09 LAB — MULTIPLE MYELOMA PANEL, SERUM
Albumin SerPl Elph-Mcnc: 2.8 g/dL — ABNORMAL LOW (ref 2.9–4.4)
Albumin/Glob SerPl: 1 (ref 0.7–1.7)
Alpha 1: 0.3 g/dL (ref 0.0–0.4)
Alpha2 Glob SerPl Elph-Mcnc: 0.7 g/dL (ref 0.4–1.0)
B-Globulin SerPl Elph-Mcnc: 0.9 g/dL (ref 0.7–1.3)
Gamma Glob SerPl Elph-Mcnc: 1 g/dL (ref 0.4–1.8)
Globulin, Total: 2.9 g/dL (ref 2.2–3.9)
IGM (IMMUNOGLOBULIN M), SRM: 25 mg/dL (ref 20–172)
IgA: 427 mg/dL (ref 61–437)
IgG (Immunoglobin G), Serum: 1165 mg/dL (ref 700–1600)
TOTAL PROTEIN ELP: 5.7 g/dL — AB (ref 6.0–8.5)

## 2018-11-09 LAB — CBC WITH DIFFERENTIAL/PLATELET
Abs Immature Granulocytes: 0 10*3/uL (ref 0.00–0.07)
BASOS PCT: 0 %
Basophils Absolute: 0 10*3/uL (ref 0.0–0.1)
EOS PCT: 10 %
Eosinophils Absolute: 0.2 10*3/uL (ref 0.0–0.5)
HCT: 24.3 % — ABNORMAL LOW (ref 39.0–52.0)
Hemoglobin: 7.9 g/dL — ABNORMAL LOW (ref 13.0–17.0)
Immature Granulocytes: 0 %
Lymphocytes Relative: 22 %
Lymphs Abs: 0.3 10*3/uL — ABNORMAL LOW (ref 0.7–4.0)
MCH: 30.7 pg (ref 26.0–34.0)
MCHC: 32.5 g/dL (ref 30.0–36.0)
MCV: 94.6 fL (ref 80.0–100.0)
Monocytes Absolute: 0.1 10*3/uL (ref 0.1–1.0)
Monocytes Relative: 6 %
Neutro Abs: 0.9 10*3/uL — ABNORMAL LOW (ref 1.7–7.7)
Neutrophils Relative %: 62 %
Platelets: 53 10*3/uL — ABNORMAL LOW (ref 150–400)
RBC: 2.57 MIL/uL — ABNORMAL LOW (ref 4.22–5.81)
RDW: 16.3 % — ABNORMAL HIGH (ref 11.5–15.5)
Smear Review: NORMAL
WBC: 1.5 10*3/uL — ABNORMAL LOW (ref 4.0–10.5)
nRBC: 0 % (ref 0.0–0.2)

## 2018-11-09 LAB — SURGICAL PATHOLOGY

## 2018-11-09 MED ORDER — FENTANYL CITRATE (PF) 100 MCG/2ML IJ SOLN
INTRAMUSCULAR | Status: AC | PRN
  Administered 2018-11-09 (×2): 50 ug via INTRAVENOUS

## 2018-11-09 MED ORDER — HEPARIN SOD (PORK) LOCK FLUSH 100 UNIT/ML IV SOLN
INTRAVENOUS | Status: AC
  Filled 2018-11-09: qty 5

## 2018-11-09 MED ORDER — TRAMADOL HCL 50 MG PO TABS: 50.0000 mg | ORAL_TABLET | Freq: Four times a day (QID) | ORAL | 0 refills | Status: AC | PRN

## 2018-11-09 MED ORDER — MIDAZOLAM HCL 2 MG/2ML IJ SOLN
INTRAMUSCULAR | Status: AC
  Filled 2018-11-09: qty 4

## 2018-11-09 MED ORDER — FENTANYL CITRATE (PF) 100 MCG/2ML IJ SOLN
INTRAMUSCULAR | Status: AC
  Filled 2018-11-09: qty 2

## 2018-11-09 MED ORDER — MIDAZOLAM HCL 2 MG/2ML IJ SOLN
INTRAMUSCULAR | Status: AC | PRN
  Administered 2018-11-09 (×2): 1 mg via INTRAVENOUS

## 2018-11-09 NOTE — Clinical Social Work Note (Signed)
Patient is medically ready for discharge today to [facility]. CSW notified patient's POA [name] [phone] of discharge today. Gershon Mussel is in agreement with discharge today to [facility]. Patient will be transported by EMS. CSW also notified Claiborne Billings at [facility] of discharge today. RN will call report and call for transport.   [place], [place]

## 2018-11-09 NOTE — Procedures (Signed)
Anemia, leukopenia  S/p CT BM ASP AND CORE BX  No comp Stable ebl min Path pending Full report in pacs

## 2018-11-09 NOTE — H&P (Signed)
Chart and imaging reviewed  No changes from adm H&P  Agree with plan for CT BM ASP and Core bx  Consent obtained  Risks and benefits discussed with the patient including, but not limited to bleeding, infection, damage to adjacent structures or low yield requiring additional tests.  All of the patient's questions were answered, patient is agreeable to proceed. Consent signed and in chart.

## 2018-11-09 NOTE — Anesthesia Postprocedure Evaluation (Signed)
Anesthesia Post Note  Patient: Aaron Harrison  Procedure(s) Performed: ESOPHAGOGASTRODUODENOSCOPY (EGD) (N/A ) COLONOSCOPY (N/A )  Patient location during evaluation: Endoscopy Anesthesia Type: General Level of consciousness: awake and alert Pain management: pain level controlled Vital Signs Assessment: post-procedure vital signs reviewed and stable Respiratory status: spontaneous breathing and respiratory function stable Cardiovascular status: stable Anesthetic complications: no     Last Vitals:  Vitals:   11/08/18 2035 11/09/18 0744  BP: (!) 116/59 119/64  Pulse: 77 71  Resp: 16   Temp: 36.7 C   SpO2: 98% 98%    Last Pain:  Vitals:   11/09/18 0744  TempSrc:   PainSc: 0-No pain                 Rakia Frayne K

## 2018-11-09 NOTE — Discharge Instructions (Signed)
Heart healthy diet. °Activity as tolerated. °

## 2018-11-09 NOTE — Progress Notes (Signed)
Report was given to [place] at [facility]. EMS was called. Collier Bullock RN

## 2018-11-11 ENCOUNTER — Other Ambulatory Visit: Payer: Medicare HMO

## 2018-11-11 ENCOUNTER — Telehealth: Payer: Self-pay | Admitting: Internal Medicine

## 2018-11-11 NOTE — Progress Notes (Signed)
Tumor Board Documentation  ROSTON GRUNEWALD was presented by Dr Rogue Bussing at our Tumor Board on 11/11/2018, which included representatives from medical oncology, radiation oncology, surgical oncology, internal medicine, navigation, pathology, radiology, surgical, nutrition, research, pulmonology.  Denis currently presents as a new patient, for new positive pathology, for discussion, for Nixon with history of the following treatments: surgical intervention(s).  Additionally, we reviewed previous medical and familial history, history of present illness, and recent lab results along with all available histopathologic and imaging studies. 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   Clinical Trial Status: not discussed   Staging used: AJCC Stage Group  AML, Invasive Adenocarcinoma of rectum National site-specific guidelines NCCN were discussed with respect to the case.  Tumor board is a meeting of clinicians from various specialty areas who evaluate and discuss patients for whom a multidisciplinary approach is being considered. Final determinations in the plan of care are those of the provider(s). The responsibility for follow up of recommendations given during tumor board is that of the provider.   Today's extended care, comprehensive team conference, Marguis was not present for the discussion and was not examined.   Multidisciplinary Tumor Board is a multidisciplinary case peer review process.  Decisions discussed in the Multidisciplinary Tumor Board reflect the opinions of the specialists present at the conference without having examined the patient.  Ultimately, treatment and diagnostic decisions rest with the primary provider(s) and the patient.

## 2018-11-11 NOTE — Telephone Encounter (Signed)
Spoke with patient's healthcare power of attorney Mr. 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  Discussed at length regarding prognosis/treatment option. Also, discussed re: hospice  Given patient multiple medical problems/new diagnosis of rectal cancer/recent stroke/CKD- will discuss with patient when available at next visit.   Please also schedule appt with Josh after my visit on 1/24. Thx GB

## 2018-11-18 ENCOUNTER — Telehealth: Payer: Self-pay | Admitting: *Deleted

## 2018-11-18 NOTE — Telephone Encounter (Signed)
Patient's HCPOA has the flu and does not want to expose our patients or this patient to it. [facility] is transporting the patient to his appointment tomorrow , but Fredia Beets would like to be a part of the conversation with the patient. He requests to be phoned into the conversation when the doctor goes into the room and can be reached at [phone]. Please call him when the doc goes into room

## 2018-11-19 ENCOUNTER — Inpatient Hospital Stay: Payer: Medicare HMO | Attending: Internal Medicine | Admitting: Internal Medicine

## 2018-11-19 ENCOUNTER — Inpatient Hospital Stay (HOSPITAL_BASED_OUTPATIENT_CLINIC_OR_DEPARTMENT_OTHER): Payer: Medicare HMO | Admitting: Hospice and Palliative Medicine

## 2018-11-19 ENCOUNTER — Encounter (HOSPITAL_COMMUNITY): Payer: Self-pay | Admitting: Internal Medicine

## 2018-11-19 VITALS — BP 108/62 | HR 73 | Temp 98.2°F | Resp 16

## 2018-11-19 DIAGNOSIS — I10 Essential (primary) hypertension: Secondary | ICD-10-CM | POA: Diagnosis not present

## 2018-11-19 DIAGNOSIS — C9200 Acute myeloblastic leukemia, not having achieved remission: Secondary | ICD-10-CM | POA: Diagnosis not present

## 2018-11-19 DIAGNOSIS — I130 Hypertensive heart and chronic kidney disease with heart failure and stage 1 through stage 4 chronic kidney disease, or unspecified chronic kidney disease: Secondary | ICD-10-CM

## 2018-11-19 DIAGNOSIS — I482 Chronic atrial fibrillation, unspecified: Secondary | ICD-10-CM | POA: Insufficient documentation

## 2018-11-19 DIAGNOSIS — Z66 Do not resuscitate: Secondary | ICD-10-CM | POA: Diagnosis not present

## 2018-11-19 DIAGNOSIS — C20 Malignant neoplasm of rectum: Secondary | ICD-10-CM

## 2018-11-19 DIAGNOSIS — N183 Chronic kidney disease, stage 3 (moderate): Secondary | ICD-10-CM | POA: Insufficient documentation

## 2018-11-19 DIAGNOSIS — Z515 Encounter for palliative care: Secondary | ICD-10-CM | POA: Diagnosis not present

## 2018-11-19 DIAGNOSIS — I5022 Chronic systolic (congestive) heart failure: Secondary | ICD-10-CM | POA: Diagnosis not present

## 2018-11-19 DIAGNOSIS — Z7982 Long term (current) use of aspirin: Secondary | ICD-10-CM | POA: Diagnosis not present

## 2018-11-19 DIAGNOSIS — Z7189 Other specified counseling: Secondary | ICD-10-CM

## 2018-11-19 NOTE — Assessment & Plan Note (Addendum)
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  In general aggressive therapies for acute leukemia was discussed.  Patient is a poor candidate.  I do not think patient is a good candidate for either hypo-methylating agents/venetoclax.  # Rectal cancer-at least locally advanced-based on colonoscopy.  No imaging.  Discussed need for chemotherapy radiation therapy followed by surgery if no distant metastatic disease noted on imaging.  # Recent stroke-currently stable.  #Patient is poor social support/he has no family.  He does have financial constraints also.  Had a long discussion the patient/and his healthcare power of attorney Mr. [name] he has been updated of the patient's clinical status regular basis.  Given the complicated serious medical synchronous illnesses [acute myeloid leukemia/rectal cancer]-in the context of his comorbidities/patient's preference to forego any aggressive therapies I think is reasonable to recommend hospice.  #Discussed that the life expectancy is out of few months.  #Introduced palliative services to patient.  Discussed with Josh borders.  # 40 minutes face-to-face with the patient discussing the above plan of care; more than 50% of time spent on prognosis/ natural history; counseling and coordination.   # DISPOSITION:  # hospice # no follow ups-Dr.B

## 2018-11-19 NOTE — Progress Notes (Signed)
[place]  Telephone:([phone] [phone]   Name: Aaron Harrison Date: 11/19/2018 MRN: [id number]  DOB: 23-Nov-1950  Patient Care Team: Donnie Coffin, MD as PCP - General (Family Medicine) Alisa Graff, FNP as Consulting Physician (Family Medicine) Murlean Iba, MD as Consulting Physician (Nephrology)    REASON FOR CONSULTATION: Palliative Care consult requested for this 68 y.o. male with multiple medical problems including chronic atrial fibrillation, chronic systolic heart failure, history of recent stroke (November 2019) he was hospitalized 11/03/2018 to 11/09/2018 with lower GI bleeding.  He underwent colonoscopy and was found to have a large rectal mass.  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  Patient was also noted to have pancytopenia and underwent bone marrow biopsy for diagnostic evaluation.  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  He was referred to palliative care to discuss goals.  SOCIAL HISTORY:    Patient is not married.  He has no children.  He has some friends who are involved in his care.  Patient was very involved in the [place].  ADVANCE DIRECTIVES:  His friend, [name], is his healthcare power of attorney.  Patient has a living will.  CODE STATUS: DNR  PAST MEDICAL HISTORY: Past Medical History:  Diagnosis Date  . Arrhythmia   . Cellulitis   . Chronic atrial fibrillation    a. not on long term anticoagulation  . Chronic systolic CHF (congestive heart failure) ([place])   . Hypertension   . Pneumonia 2011  . Stroke [facility]) 08/2018    PAST SURGICAL HISTORY:  Past Surgical History:  Procedure Laterality Date  . COLONOSCOPY N/A 11/06/2018   Procedure: COLONOSCOPY;  Surgeon: Toledo, Benay Pike, MD;  Location: [facility] ENDOSCOPY;  Service: Gastroenterology;  Laterality: N/A;  . COLONOSCOPY N/A 11/05/2018   Procedure: COLONOSCOPY;  Surgeon: Lin Landsman, MD;   Location: [facility] ENDOSCOPY;  Service: Gastroenterology;  Laterality: N/A;  . ESOPHAGOGASTRODUODENOSCOPY N/A 11/05/2018   Procedure: ESOPHAGOGASTRODUODENOSCOPY (EGD);  Surgeon: Lin Landsman, MD;  Location: [facility] ENDOSCOPY;  Service: Gastroenterology;  Laterality: N/A;  . PERIPHERAL VASCULAR CATHETERIZATION N/A 06/27/2015   Procedure: Dialysis/Perma Catheter Insertion;  Surgeon: Algernon Huxley, MD;  Location: [place] CV LAB;  Service: Cardiovascular;  Laterality: N/A;  . PERIPHERAL VASCULAR CATHETERIZATION Right 07/05/2015   Procedure: Dialysis/Perma Catheter Removal;  Surgeon: Algernon Huxley, MD;  Location: [place] CV LAB;  Service: Cardiovascular;  Laterality: Right;    HEMATOLOGY/ONCOLOGY HISTORY:   No history exists.    ALLERGIES:  is allergic to penicillins.  MEDICATIONS:  Current Outpatient Medications  Medication Sig Dispense Refill  . aspirin EC 81 MG tablet Take 81 mg by mouth daily.    . carvedilol (COREG) 12.5 MG tablet Take 12.5 mg by mouth 2 (two) times daily with a meal.    . ferrous sulfate 325 (65 FE) MG EC tablet Take 1 tablet (325 mg total) by mouth 2 (two) times daily for 30 days. 60 tablet 0  . furosemide (LASIX) 20 MG tablet Take 1 tablet (20 mg total) by mouth daily. 30 tablet 2  . tamsulosin (FLOMAX) 0.4 MG CAPS capsule Take 1 capsule (0.4 mg total) by mouth daily. 30 capsule 2  . traMADol (ULTRAM) 50 MG tablet Take 1 tablet (50 mg total) by mouth every 6 (six) hours as needed for moderate pain. 12 tablet 0  . vitamin B-12 (CYANOCOBALAMIN) 1000 MCG tablet Take 1 tablet (1,000 mcg total) by mouth daily.  30 tablet 0   No current facility-administered medications for this visit.     VITAL SIGNS: There were no vitals taken for this visit. There were no vitals filed for this visit.  Estimated body mass index is 28.93 kg/m as calculated from the following:   Height as of 11/06/18: 5' 8" (1.727 m).   Weight as of 11/06/18: 190 lb 4.1 oz (86.3 kg).  LABS: CBC:     Component Value Date/Time   WBC 1.5 (L) 11/09/2018 0626   HGB 7.9 (L) 11/09/2018 0626   HGB 15.2 07/16/2013 0239   HCT 24.3 (L) 11/09/2018 0626   HCT 43.8 07/16/2013 0239   PLT 53 (L) 11/09/2018 0626   PLT 179 07/16/2013 0239   MCV 94.6 11/09/2018 0626   MCV 90 07/16/2013 0239   NEUTROABS 0.9 (L) 11/09/2018 0626   LYMPHSABS 0.3 (L) 11/09/2018 0626   MONOABS 0.1 11/09/2018 0626   EOSABS 0.2 11/09/2018 0626   BASOSABS 0.0 11/09/2018 0626   Comprehensive Metabolic Panel:    Component Value Date/Time   NA 136 11/07/2018 0437   NA 137 07/16/2013 0239   K 3.6 11/07/2018 0437   K 3.0 (L) 07/16/2013 0239   CL 109 11/07/2018 0437   CL 103 07/16/2013 0239   CO2 21 (L) 11/07/2018 0437   CO2 25 07/16/2013 0239   BUN 16 11/07/2018 0437   BUN 19 (H) 07/16/2013 0239   CREATININE 1.39 (H) 11/07/2018 0437   CREATININE 1.53 (H) 07/16/2013 0239   GLUCOSE 118 (H) 11/07/2018 0437   GLUCOSE 198 (H) 07/16/2013 0239   CALCIUM 8.1 (L) 11/07/2018 0437   CALCIUM 8.7 07/10/2015 0358   AST 11 (L) 11/07/2018 0437   AST 22 07/16/2013 0239   ALT 7 11/07/2018 0437   ALT 23 07/16/2013 0239   ALKPHOS 69 11/07/2018 0437   ALKPHOS 91 07/16/2013 0239   BILITOT 1.6 (H) 11/07/2018 0437   BILITOT 1.8 (H) 07/16/2013 0239   PROT 5.8 (L) 11/07/2018 0437   PROT 7.3 07/16/2013 0239   ALBUMIN 2.8 (L) 11/07/2018 0437   ALBUMIN 4.1 07/16/2013 0239    RADIOGRAPHIC STUDIES: US Abdomen Complete  Result Date: 11/03/2018 CLINICAL DATA:  Anemia with low platelet count EXAM: ABDOMEN ULTRASOUND COMPLETE COMPARISON:  Ultrasound 06/16/2015 FINDINGS: Gallbladder: Multiple shadowing stonesmeasuring up to 5 mm. Negative sonographic Murphy. Normal wall thickness. Common bile duct: Diameter: 4 mm Liver: No focal hepatic abnormality. Within normal limits for echogenicity. Portal vein is patent on color Doppler imaging with normal direction of blood flow towards the liver. IVC: No abnormality visualized. Pancreas: Visualized  portion unremarkable. Spleen: Slightly enlarged with volume of 427.1 cubic cm. Right Kidney: Length: 14.9 cm. Cortical echogenicity normal. No hydronephrosis. Large cyst upper pole measuring 7.2 x 6.2 x 7.7 cm. Left Kidney: Length: 12.1 cm. Cortical echogenicity normal. No hydronephrosis. Cyst in the upper pole measuring 2.2 x 2.5 x 2.6 cm. Abdominal aorta: Ectatic up to 2.9 cm. Other findings: None. IMPRESSION: 1. Gallstones without sonographic evidence for acute cholecystitis or biliary dilatation 2. Mild splenomegaly 3. Cysts within the bilateral kidneys, 1 in each kidney. 4. Abdominal aortic diameter of 2.9 cm. Ectatic abdominal aorta at risk for aneurysm development. Recommend followup by ultrasound in 5 years. This recommendation follows ACR consensus guidelines: White Paper of the ACR Incidental Findings Committee II on Vascular Findings. J Am Coll Radiol 2013; 10:789-794. Electronically Signed   By: Donavan Foil M.D.   On: 11/03/2018 18:29   Ct Bone Marrow Biopsy &  Aspiration  Result Date: 11/09/2018 INDICATION: Anemia, thrombocytopenia, rectal mass EXAM: CT GUIDED RIGHT ILIAC BONE MARROW ASPIRATION AND CORE BIOPSY Date:  11/09/2018 11/09/2018 11:05 am Radiologist:  M. Trevor Shick, MD Guidance:  CT FLUOROSCOPY TIME:  Fluoroscopy Time: None. MEDICATIONS: None. ANESTHESIA/SEDATION: 2.0 mg IV Versed; 100 mcg IV Fentanyl Moderate Sedation Time:  13 minutes The patient was continuously monitored during the procedure by the interventional radiology nurse under my direct supervision. CONTRAST:  None. COMPLICATIONS: None PROCEDURE: Informed consent was obtained from the patient following explanation of the procedure, risks, benefits and alternatives. The patient understands, agrees and consents for the procedure. All questions were addressed. A time out was performed. The patient was positioned prone and non-contrast localization CT was performed of the pelvis to demonstrate the iliac marrow spaces. Maximal  barrier sterile technique utilized including caps, mask, sterile gowns, sterile gloves, large sterile drape, hand hygiene, and Betadine prep. Under sterile conditions and local anesthesia, an 11 gauge coaxial bone biopsy needle was advanced into the right iliac marrow space. Needle position was confirmed with CT imaging. Initially, bone marrow aspiration was performed. Next, the 11 gauge outer cannula was utilized to obtain a right iliac bone marrow core biopsy. Needle was removed. Hemostasis was obtained with compression. The patient tolerated the procedure well. Samples were prepared with the cytotechnologist. No immediate complications. IMPRESSION: CT guided right iliac bone marrow aspiration and core biopsy. Electronically Signed   By: M.  Shick M.D.   On: 11/09/2018 11:11    PERFORMANCE STATUS (ECOG) : 3 - Symptomatic, >50% confined to bed  Review of Systems As noted above. Otherwise, a complete review of systems is negative.  Physical Exam General: NAD, frail appearing, in wheelchair Cardiovascular: Irregular Pulmonary: Unlabored Abdomen: soft, nontender, + bowel sounds GU: no suprapubic tenderness Extremities: Lower extremity edema Skin: no rashes Neurological: Weakness right greater than left  IMPRESSION: Patient was seen both by Dr. Brahmanday and me for a joint visit.  Patient's healthcare power of attorney, [name] Steele, listened and participated in the conversation via conference call.  Patient was updated on the findings of his oncologic work-up.  He verbalized an understanding that he has two distinct and advanced cancers without any viable treatment options.  Patient says he recognizes that these cancers are terminal and incurable and that he is likely approaching end-of-life.  We discussed the option of focusing on his comfort and quality of life and patient says that is the route he wishes to pursue.  We discussed hospice care in detail.  Patient says he is familiar with hospice  care as they were involved with other family members.  Both patient and Mr. Steele were also involved with the local hospice organization during their careers.  Patient says that he would like to have hospice at [facility].  Order sent and I called the referral center and hospice to provide information regarding the patient. I have also called and left a message for the facility SW to update her on the plan.   Note that patient has recently obtained Medicaid and therefore should have a payer source for a long-term care bed allowing him to receive hospice services.  Symptomatically, patient currently denies any distressing symptoms.  However, he does want to ensure that he is comfortable going forward and that hospice manages any distressing symptoms as they arise.  We discussed CODE STATUS.  Patient says that he would not want to be resuscitated nor have his life prolonged on machines.    I completed a DNR form for him to take back to the facility. He says he is not interested in future hospitalizations and instead wants to focus on comfort.   I completed a MOST form today. The patient and family outlined their wishes for the following treatment decisions:  Cardiopulmonary Resuscitation: Do Not Attempt Resuscitation (DNR/No CPR)  Medical Interventions: Comfort Measures: Keep clean, warm, and dry. Use medication by any route, positioning, wound care, and other measures to relieve pain and suffering. Use oxygen, suction and manual treatment of airway obstruction as needed for comfort. Do not transfer to the hospital unless comfort needs cannot be met in current location.  Antibiotics: Determine use of limitation of antibiotics when infection occurs  IV Fluids: IV fluids for a defined trial period  Feeding Tube: No feeding tube    PLAN: DNR and MOST form completed Hospice Referral at SNF Comfort measures at SNF without plan for future hospitalization RTC as needed   Patient  expressed understanding and was in agreement with this plan. He also understands that He can call clinic at any time with any questions, concerns, or complaints.     Time Total: 60 minutes  Visit consisted of counseling and education dealing with the complex and emotionally intense issues of symptom management and palliative care in the setting of serious and potentially life-threatening illness.Greater than 50%  of this time was spent counseling and coordinating care related to the above assessment and plan.  Signed by: Altha Harm, PhD, NP-C [phone] (Work Cell)

## 2018-11-19 NOTE — Progress Notes (Signed)
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     HISTORY OF PRESENTING ILLNESS:  Aaron Harrison 68 y.o.  male male history of CKD- III-IV/chronic anemia-recent history of stroke [with mild left-sided residual weakness]  was recently admitted to hospital for rectal bleeding.  Colonoscopy/biopsy showed adenocarcinoma.  Patient also had pancytopenia-for which he had a bone marrow biopsy that showed positive for acute myeloid leukemia.  Patient is currently at [facility] health/rehab.  He feels poorly.  Poor appetite.  However denies any blood in stools or black or stools.  No nausea no vomiting.  No constipation.   Review of Systems  Constitutional: Positive for malaise/fatigue and weight loss. Negative for chills, diaphoresis and fever.  HENT: Negative for nosebleeds and sore throat.   Eyes: Negative for double vision.  Respiratory: Positive for shortness of breath (On exertion.). Negative for cough, hemoptysis, sputum production and wheezing.   Cardiovascular: Negative for chest pain, palpitations, orthopnea and leg swelling.  Gastrointestinal: Negative for abdominal pain, blood in stool, constipation, diarrhea, heartburn, melena, nausea and vomiting.  Genitourinary: Negative for dysuria, frequency and urgency.  Musculoskeletal: Negative for back pain and joint pain.  Skin: Negative.  Negative for itching and rash.  Neurological: Negative for dizziness, tingling, focal weakness (Chronic transfer.), weakness and headaches.  Endo/Heme/Allergies: Bruises/bleeds easily.  Psychiatric/Behavioral: Negative for depression. The  patient is not nervous/anxious and does not have insomnia.      MEDICAL HISTORY:  Past Medical History:  Diagnosis Date  . Arrhythmia   . Cellulitis   . Chronic atrial fibrillation    a. not on long term anticoagulation  . Chronic systolic CHF (congestive heart failure) ([place])   . Hypertension   . Pneumonia 2011  . Stroke [facility]) 08/2018    SURGICAL HISTORY: Past Surgical History:  Procedure Laterality Date  . COLONOSCOPY N/A 11/06/2018   Procedure: COLONOSCOPY;  Surgeon: Toledo, Benay Pike, MD;  Location: [facility] ENDOSCOPY;  Service: Gastroenterology;  Laterality: N/A;  . COLONOSCOPY N/A 11/05/2018   Procedure: COLONOSCOPY;  Surgeon: Lin Landsman, MD;  Location: [facility] ENDOSCOPY;  Service: Gastroenterology;  Laterality: N/A;  . ESOPHAGOGASTRODUODENOSCOPY N/A 11/05/2018   Procedure: ESOPHAGOGASTRODUODENOSCOPY (EGD);  Surgeon: Lin Landsman, MD;  Location: [facility] ENDOSCOPY;  Service: Gastroenterology;  Laterality: N/A;  . PERIPHERAL VASCULAR CATHETERIZATION N/A 06/27/2015   Procedure: Dialysis/Perma Catheter Insertion;  Surgeon: Algernon Huxley, MD;  Location: [place] CV LAB;  Service: Cardiovascular;  Laterality: N/A;  . PERIPHERAL VASCULAR CATHETERIZATION Right 07/05/2015   Procedure: Dialysis/Perma Catheter Removal;  Surgeon: Algernon Huxley, MD;  Location: [place] CV LAB;  Service: Cardiovascular;  Laterality: Right;    SOCIAL HISTORY: Social History   Socioeconomic History  . Marital status: Single    Spouse name: Not on file  . Number of children: Not on file  . Years of education: Not on file  . Highest education level: Not on file  Occupational History  . Not on file  Social Needs  . Financial resource strain: Not on file  . Food insecurity:    Worry: Not on file    Inability: Not on file  . Transportation needs:  Medical: Not on file    Non-medical: Not on file  Tobacco Use  . Smoking status: Never Smoker  . Smokeless tobacco: Never Used  Substance and  Sexual Activity  . Alcohol use: No  . Drug use: No  . Sexual activity: Not on file  Lifestyle  . Physical activity:    Days per week: Not on file    Minutes per session: Not on file  . Stress: Not on file  Relationships  . Social connections:    Talks on phone: Not on file    Gets together: Not on file    Attends religious service: Not on file    Active member of club or organization: Not on file    Attends meetings of clubs or organizations: Not on file    Relationship status: Not on file  . Intimate partner violence:    Fear of current or ex partner: Not on file    Emotionally abused: Not on file    Physically abused: Not on file    Forced sexual activity: Not on file  Other Topics Concern  . Not on file  Social History Narrative   Previous to work as a Freight forwarder at [employer].  Denies any alcohol use.  Denies any smoking.  Patient lives in a senior apartments-since 2008.  Otherwise he is fairly active for his age/driving and independent of ADLs.    FAMILY HISTORY: Family History  Problem Relation Age of Onset  . Diabetes Mother   . Stroke Mother   . Heart failure Father     ALLERGIES:  is allergic to penicillins.  MEDICATIONS:  Current Outpatient Medications  Medication Sig Dispense Refill  . aspirin EC 81 MG tablet Take 81 mg by mouth daily.    . carvedilol (COREG) 12.5 MG tablet Take 12.5 mg by mouth 2 (two) times daily with a meal.    . ferrous sulfate 325 (65 FE) MG EC tablet Take 1 tablet (325 mg total) by mouth 2 (two) times daily for 30 days. 60 tablet 0  . furosemide (LASIX) 20 MG tablet Take 1 tablet (20 mg total) by mouth daily. 30 tablet 2  . tamsulosin (FLOMAX) 0.4 MG CAPS capsule Take 1 capsule (0.4 mg total) by mouth daily. 30 capsule 2  . traMADol (ULTRAM) 50 MG tablet Take 1 tablet (50 mg total) by mouth every 6 (six) hours as needed for moderate pain. 12 tablet 0  . vitamin B-12 (CYANOCOBALAMIN) 1000 MCG tablet Take 1 tablet (1,000  mcg total) by mouth daily. 30 tablet 0   No current facility-administered medications for this visit.       Marland Kitchen  PHYSICAL EXAMINATION: ECOG PERFORMANCE STATUS: 3 - Symptomatic, >50% confined to bed  Vitals:   11/19/18 1142  BP: 108/62  Pulse: 73  Resp: 16  Temp: 98.2 F (36.8 C)   There were no vitals filed for this visit.  Physical Exam  Constitutional: He is oriented to person, place, and time and well-developed, well-nourished, and in no distress.  Frail-appearing Caucasian male patient.  Is resting he is in a wheelchair.  He is alone.  His H POA is on the phone.  HENT:  Head: Normocephalic and atraumatic.  Mouth/Throat: Oropharynx is clear and moist. No oropharyngeal exudate.  Eyes: Pupils are equal, round, and reactive to light.  Neck: Normal range of motion. Neck supple.  Cardiovascular: Normal rate and regular rhythm.  Pulmonary/Chest: No respiratory distress. He has no wheezes.  Abdominal: Soft. Bowel sounds  are normal. He exhibits no distension and no mass. There is no abdominal tenderness. There is no rebound and no guarding.  Musculoskeletal: Normal range of motion.        General: No tenderness or edema.  Neurological: He is alert and oriented to person, place, and time.  Skin: Skin is warm.  Multiple chronic bruises.   Psychiatric: Affect normal.     LABORATORY DATA:  I have reviewed the data as listed Lab Results  Component Value Date   WBC 1.5 (L) 11/09/2018   HGB 7.9 (L) 11/09/2018   HCT 24.3 (L) 11/09/2018   MCV 94.6 11/09/2018   PLT 53 (L) 11/09/2018   Recent Labs    09/05/18 1400  11/03/18 1103 11/03/18 1931 11/04/18 0530 11/05/18 0722 11/07/18 0437  NA 139   < > 135  --  135 134* 136  K 4.0   < > 3.7  --  3.7 3.9 3.6  CL 108   < > 105  --  107 105 109  CO2 23   < > 20*  --  22 21* 21*  GLUCOSE 106*   < > 112*  --  103* 107* 118*  BUN 20   < > 25*  --  25* 22 16  CREATININE 1.36*   < > 1.62*  --  1.64* 1.49* 1.39*  CALCIUM 8.6*   < >  8.6*  --  8.3* 8.2* 8.1*  GFRNONAA 53*   < > 44*  --  43* 49* 53*  GFRAA >60   < > 51*  --  50* 56* >60  PROT 7.2  --  6.7  --   --  6.0* 5.8*  ALBUMIN 3.8  --  3.1*  --   --  2.8* 2.8*  AST 13*  --  12*  --   --  11* 11*  ALT 9  --  8  --   --  7 7  ALKPHOS 62  --  75  --   --  70 69  BILITOT 2.3*  --  2.6* 2.8*  --  3.0* 1.6*  BILIDIR 0.5*  --   --  0.7*  --  0.5*  --   IBILI 1.8*  --   --  2.1*  --  2.5*  --    < > = values in this interval not displayed.    RADIOGRAPHIC STUDIES: I have personally reviewed the radiological images as listed and agreed with the findings in the report. US Abdomen Complete  Result Date: 11/03/2018 CLINICAL DATA:  Anemia with low platelet count EXAM: ABDOMEN ULTRASOUND COMPLETE COMPARISON:  Ultrasound 06/16/2015 FINDINGS: Gallbladder: Multiple shadowing stonesmeasuring up to 5 mm. Negative sonographic Murphy. Normal wall thickness. Common bile duct: Diameter: 4 mm Liver: No focal hepatic abnormality. Within normal limits for echogenicity. Portal vein is patent on color Doppler imaging with normal direction of blood flow towards the liver. IVC: No abnormality visualized. Pancreas: Visualized portion unremarkable. Spleen: Slightly enlarged with volume of 427.1 cubic cm. Right Kidney: Length: 14.9 cm. Cortical echogenicity normal. No hydronephrosis. Large cyst upper pole measuring 7.2 x 6.2 x 7.7 cm. Left Kidney: Length: 12.1 cm. Cortical echogenicity normal. No hydronephrosis. Cyst in the upper pole measuring 2.2 x 2.5 x 2.6 cm. Abdominal aorta: Ectatic up to 2.9 cm. Other findings: None. IMPRESSION: 1. Gallstones without sonographic evidence for acute cholecystitis or biliary dilatation 2. Mild splenomegaly 3. Cysts within the bilateral kidneys, 1 in each kidney. 4. Abdominal aortic diameter  of 2.9 cm. Ectatic abdominal aorta at risk for aneurysm development. Recommend followup by ultrasound in 5 years. This recommendation follows ACR consensus guidelines: White Paper  of the ACR Incidental Findings Committee II on Vascular Findings. J Am Coll Radiol 2013; 10:789-794. Electronically Signed   By: Donavan Foil M.D.   On: 11/03/2018 18:29   Ct Bone Marrow Biopsy & Aspiration  Result Date: 11/09/2018 INDICATION: Anemia, thrombocytopenia, rectal mass EXAM: CT GUIDED RIGHT ILIAC BONE MARROW ASPIRATION AND CORE BIOPSY Date:  11/09/2018 11/09/2018 11:05 am Radiologist:  M. Daryll Brod, MD Guidance:  CT FLUOROSCOPY TIME:  Fluoroscopy Time: None. MEDICATIONS: None. ANESTHESIA/SEDATION: 2.0 mg IV Versed; 100 mcg IV Fentanyl Moderate Sedation Time:  13 minutes The patient was continuously monitored during the procedure by the interventional radiology nurse under my direct supervision. CONTRAST:  None. COMPLICATIONS: None PROCEDURE: Informed consent was obtained from the patient following explanation of the procedure, risks, benefits and alternatives. The patient understands, agrees and consents for the procedure. All questions were addressed. A time out was performed. The patient was positioned prone and non-contrast localization CT was performed of the pelvis to demonstrate the iliac marrow spaces. Maximal barrier sterile technique utilized including caps, mask, sterile gowns, sterile gloves, large sterile drape, hand hygiene, and Betadine prep. Under sterile conditions and local anesthesia, an 11 gauge coaxial bone biopsy needle was advanced into the right iliac marrow space. Needle position was confirmed with CT imaging. Initially, bone marrow aspiration was performed. Next, the 11 gauge outer cannula was utilized to obtain a right iliac bone marrow core biopsy. Needle was removed. Hemostasis was obtained with compression. The patient tolerated the procedure well. Samples were prepared with the cytotechnologist. No immediate complications. IMPRESSION: CT guided right iliac bone marrow aspiration and core biopsy. Electronically Signed   By: Jerilynn Mages.  Shick M.D.   On: 11/09/2018 11:11     ASSESSMENT & PLAN:   Acute myeloid leukemia not having achieved remission ([place]) # AML with pancytopenia-I discussed at length disease diagnosis of acute myeloid leukemia and given patient's multiple comorbidities-this would be incurable.  In general aggressive therapies for acute leukemia was discussed.  Patient is a poor candidate.  I do not think patient is a good candidate for either hypo-methylating agents/venetoclax.  # Rectal cancer-at least locally advanced-based on colonoscopy.  No imaging.  Discussed need for chemotherapy radiation therapy followed by surgery if no distant metastatic disease noted on imaging.  # Recent stroke-currently stable.  #Patient is poor social support/he has no family.  He does have financial constraints also.  Had a long discussion the patient/and his healthcare power of attorney Mr. [name] he has been updated of the patient's clinical status regular basis.  Given the complicated serious medical synchronous illnesses [acute myeloid leukemia/rectal cancer]-in the context of his comorbidities/patient's preference to forego any aggressive therapies I think is reasonable to recommend hospice.  #Discussed that the life expectancy is out of few months.  #Introduced palliative services to patient.  Discussed with Josh borders.  # 40 minutes face-to-face with the patient discussing the above plan of care; more than 50% of time spent on prognosis/ natural history; counseling and coordination.   # DISPOSITION:  # hospice # no follow ups-Dr.B      All questions were answered. The patient knows to call the clinic with any problems, questions or concerns.       Cammie Sickle, MD 11/25/2018 9:01 AM

## 2018-11-22 ENCOUNTER — Ambulatory Visit: Payer: Medicare Other | Admitting: Gastroenterology

## 2018-11-27 ENCOUNTER — Encounter: Payer: Self-pay | Admitting: Internal Medicine

## 2018-12-01 ENCOUNTER — Encounter (HOSPITAL_COMMUNITY): Payer: Self-pay | Admitting: Internal Medicine

## 2019-01-04 NOTE — Progress Notes (Deleted)
Patient ID: Aaron Harrison, male    DOB: 21-Oct-1951, 68 y.o.   MRN: [id number]  HPI  Mr Gesner is a 68 y/o male with a history of HTN, atrial fibrillation, stroke and chronic HF.   Echo report from 09/06/18 reviewed and showed an EF of 50-55%.  Admitted 11/03/2018 due to acute GI bleed. Neurology, GI and oncology consults obtained. EGD showed erosions but no acute bleed. Colonoscopy showed rectal adenocarcinoma. Given 1 unit of blood. Discharged after 6 days. Admitted 09/11/18 due to back pain and confusion. Diagnosed with right cerebral infarct. Also noted to be in atrial fibrillation. Neurology consult obtained and he was discharged after 4 days. Admitted 09/09/18 due to weakness and back pain s/p fall. Psychiatry consult obtained to discuss mental capacity. Discharged after 2 days. Admitted 09/06/18 due to acute on chronic heart failure. Cardiology consult obtained. Refused diuretics and left AMA the following day.   He presents today for a follow-up visit   Past Medical History:  Diagnosis Date  . Arrhythmia   . Cellulitis   . Chronic atrial fibrillation    a. not on long term anticoagulation  . Chronic systolic CHF (congestive heart failure) ([place])   . Hypertension   . Pneumonia 2011  . Stroke [facility]) 08/2018   Past Surgical History:  Procedure Laterality Date  . COLONOSCOPY N/A 11/06/2018   Procedure: COLONOSCOPY;  Surgeon: Toledo, Benay Pike, MD;  Location: [facility] ENDOSCOPY;  Service: Gastroenterology;  Laterality: N/A;  . COLONOSCOPY N/A 11/05/2018   Procedure: COLONOSCOPY;  Surgeon: Lin Landsman, MD;  Location: [facility] ENDOSCOPY;  Service: Gastroenterology;  Laterality: N/A;  . ESOPHAGOGASTRODUODENOSCOPY N/A 11/05/2018   Procedure: ESOPHAGOGASTRODUODENOSCOPY (EGD);  Surgeon: Lin Landsman, MD;  Location: [facility] ENDOSCOPY;  Service: Gastroenterology;  Laterality: N/A;  . PERIPHERAL VASCULAR CATHETERIZATION N/A 06/27/2015   Procedure: Dialysis/Perma Catheter Insertion;  Surgeon:  Algernon Huxley, MD;  Location: [place] CV LAB;  Service: Cardiovascular;  Laterality: N/A;  . PERIPHERAL VASCULAR CATHETERIZATION Right 07/05/2015   Procedure: Dialysis/Perma Catheter Removal;  Surgeon: Algernon Huxley, MD;  Location: [place] CV LAB;  Service: Cardiovascular;  Laterality: Right;   Family History  Problem Relation Age of Onset  . Diabetes Mother   . Stroke Mother   . Heart failure Father    Social History   Tobacco Use  . Smoking status: Never Smoker  . Smokeless tobacco: Never Used  Substance Use Topics  . Alcohol use: No   Allergies  Allergen Reactions  . Penicillins Nausea And Vomiting    Has patient had a PCN reaction causing immediate rash, facial/tongue/throat swelling, SOB or lightheadedness with hypotension: No Has patient had a PCN reaction causing severe rash involving mucus membranes or skin necrosis: No Has patient had a PCN reaction that required hospitalization: No Has patient had a PCN reaction occurring within the last 10 years: No If all of the above answers are "NO", then may proceed with Cephalosporin use.      Review of Systems  Constitutional: Positive for fatigue. Negative for appetite change.  HENT: Negative for congestion, postnasal drip and sore throat.   Eyes: Negative.   Respiratory: Negative for cough, chest tightness and shortness of breath.   Cardiovascular: Negative for chest pain, palpitations and leg swelling.  Gastrointestinal: Negative for abdominal distention and abdominal pain.  Endocrine: Negative.   Genitourinary: Negative.   Musculoskeletal: Negative for back pain and neck pain.  Skin: Negative.   Allergic/Immunologic: Negative.   Neurological: Positive for weakness (  left sided weakness). Negative for dizziness and light-headedness.  Hematological: Negative for adenopathy. Bruises/bleeds easily.  Psychiatric/Behavioral: Negative for dysphoric mood and sleep disturbance (sleeping on 1 pillow). The patient is not  nervous/anxious.       Physical Exam Vitals signs and nursing note reviewed.  Constitutional:      Appearance: Normal appearance.  HENT:     Head: Normocephalic and atraumatic.  Neck:     Musculoskeletal: Normal range of motion and neck supple.  Cardiovascular:     Rate and Rhythm: Normal rate. Rhythm irregular.  Pulmonary:     Effort: Pulmonary effort is normal.     Breath sounds: Normal breath sounds.  Abdominal:     General: There is no distension.     Palpations: Abdomen is soft.  Musculoskeletal:        General: No swelling or tenderness.  Skin:    General: Skin is warm and dry.  Neurological:     Mental Status: He is alert and oriented to person, place, and time.     Motor: Weakness (left arm) present.  Psychiatric:        Mood and Affect: Mood normal.        Behavior: Behavior normal.    Assessment and Plan:  1: Chronic HF with preserved ejection fraction- - NYHA class II - euvolemic today - being weighed daily; reminded to have them call for an overnight weight gain of >2 pounds or a weekly weight gain of >5 pounds - weight  - not adding salt to his food - currently receiving PT/OT on a daily basis at SNF - BNP 09/05/18 was 813.0 - PharmD reconciled medications via [facility] list - states that he's received his flu vaccine for this season  2: HTN- - BP  - currently seeing PCP at facility - [facility] 11/07/2018 reviewed and showed sodium 136, potassium 3.6, creatinine 1.39, GFR 53  3: Rectal cancer-  Facility medication list was reviewed.

## 2019-01-06 ENCOUNTER — Ambulatory Visit: Payer: Medicare Other | Admitting: Family

## 2019-01-06 ENCOUNTER — Telehealth: Payer: Self-pay | Admitting: Family

## 2019-01-06 NOTE — Telephone Encounter (Signed)
Patient did not show for his [facility] appointment on 01/06/2019. Will attempt to reschedule.

## 2019-01-26 DEATH — deceased
